# Patient Record
Sex: Female | Born: 1935
Health system: Southern US, Community
[De-identification: ages and names within clinical notes are randomized; demographics above are authoritative.]

## PROBLEM LIST (undated history)

## (undated) DIAGNOSIS — M359 Systemic involvement of connective tissue, unspecified: Secondary | ICD-10-CM

## (undated) DIAGNOSIS — Z8619 Personal history of other infectious and parasitic diseases: Secondary | ICD-10-CM

## (undated) DIAGNOSIS — M19012 Primary osteoarthritis, left shoulder: Secondary | ICD-10-CM

## (undated) DIAGNOSIS — E039 Hypothyroidism, unspecified: Secondary | ICD-10-CM

## (undated) DIAGNOSIS — R011 Cardiac murmur, unspecified: Secondary | ICD-10-CM

## (undated) DIAGNOSIS — I10 Essential (primary) hypertension: Secondary | ICD-10-CM

## (undated) DIAGNOSIS — K219 Gastro-esophageal reflux disease without esophagitis: Secondary | ICD-10-CM

## (undated) DIAGNOSIS — F329 Major depressive disorder, single episode, unspecified: Secondary | ICD-10-CM

## (undated) DIAGNOSIS — R1314 Dysphagia, pharyngoesophageal phase: Secondary | ICD-10-CM

## (undated) DIAGNOSIS — M353 Polymyalgia rheumatica: Secondary | ICD-10-CM

## (undated) DIAGNOSIS — M1990 Unspecified osteoarthritis, unspecified site: Secondary | ICD-10-CM

## (undated) HISTORY — PX: ARTHOSCOPIC ROTAOR CUFF REPAIR: SHX5002

## (undated) HISTORY — PX: FACELIFT: SHX1566

## (undated) HISTORY — PX: CATARACT EXTRACTION W/ INTRAOCULAR LENS IMPLANT: SHX1309

## (undated) HISTORY — DX: Major depressive disorder, single episode, unspecified: F32.9

## (undated) HISTORY — PX: SKIN CANCER EXCISION: SHX779

## (undated) HISTORY — PX: FOOT SURGERY: SHX648

## (undated) HISTORY — DX: Dysphagia, pharyngoesophageal phase: R13.14

## (undated) HISTORY — PX: RENAL ARTERY STENT: SHX2321

## (undated) HISTORY — DX: Personal history of other infectious and parasitic diseases: Z86.19

---

## 2007-05-23 ENCOUNTER — Ambulatory Visit: Payer: Self-pay | Admitting: General Surgery

## 2009-02-15 ENCOUNTER — Ambulatory Visit: Payer: Self-pay | Admitting: Internal Medicine

## 2009-05-02 ENCOUNTER — Ambulatory Visit: Payer: Self-pay | Admitting: Internal Medicine

## 2010-04-12 ENCOUNTER — Ambulatory Visit: Payer: Self-pay | Admitting: Internal Medicine

## 2011-09-11 LAB — HM MAMMOGRAPHY: HM MAMMO: NORMAL

## 2011-09-11 LAB — HM DEXA SCAN

## 2011-09-26 DIAGNOSIS — R1011 Right upper quadrant pain: Secondary | ICD-10-CM | POA: Diagnosis not present

## 2011-09-26 DIAGNOSIS — E039 Hypothyroidism, unspecified: Secondary | ICD-10-CM | POA: Diagnosis not present

## 2011-09-26 DIAGNOSIS — I10 Essential (primary) hypertension: Secondary | ICD-10-CM | POA: Diagnosis not present

## 2011-09-26 DIAGNOSIS — R300 Dysuria: Secondary | ICD-10-CM | POA: Diagnosis not present

## 2011-09-26 DIAGNOSIS — E559 Vitamin D deficiency, unspecified: Secondary | ICD-10-CM | POA: Diagnosis not present

## 2011-09-26 DIAGNOSIS — Z0000 Encounter for general adult medical examination without abnormal findings: Secondary | ICD-10-CM | POA: Diagnosis not present

## 2011-09-26 DIAGNOSIS — K219 Gastro-esophageal reflux disease without esophagitis: Secondary | ICD-10-CM | POA: Diagnosis not present

## 2011-09-26 DIAGNOSIS — E890 Postprocedural hypothyroidism: Secondary | ICD-10-CM | POA: Diagnosis not present

## 2011-09-28 ENCOUNTER — Ambulatory Visit: Payer: Self-pay

## 2011-09-28 DIAGNOSIS — R079 Chest pain, unspecified: Secondary | ICD-10-CM | POA: Diagnosis not present

## 2011-09-28 DIAGNOSIS — M75120 Complete rotator cuff tear or rupture of unspecified shoulder, not specified as traumatic: Secondary | ICD-10-CM | POA: Diagnosis not present

## 2011-09-28 DIAGNOSIS — M19019 Primary osteoarthritis, unspecified shoulder: Secondary | ICD-10-CM | POA: Diagnosis not present

## 2011-09-28 DIAGNOSIS — S2239XA Fracture of one rib, unspecified side, initial encounter for closed fracture: Secondary | ICD-10-CM | POA: Diagnosis not present

## 2011-10-09 DIAGNOSIS — R1011 Right upper quadrant pain: Secondary | ICD-10-CM | POA: Diagnosis not present

## 2011-10-17 DIAGNOSIS — M159 Polyosteoarthritis, unspecified: Secondary | ICD-10-CM | POA: Diagnosis not present

## 2011-10-17 DIAGNOSIS — E2839 Other primary ovarian failure: Secondary | ICD-10-CM | POA: Diagnosis not present

## 2011-10-23 DIAGNOSIS — J019 Acute sinusitis, unspecified: Secondary | ICD-10-CM | POA: Diagnosis not present

## 2011-10-23 DIAGNOSIS — M810 Age-related osteoporosis without current pathological fracture: Secondary | ICD-10-CM | POA: Diagnosis not present

## 2011-10-23 DIAGNOSIS — I10 Essential (primary) hypertension: Secondary | ICD-10-CM | POA: Diagnosis not present

## 2011-10-23 DIAGNOSIS — E039 Hypothyroidism, unspecified: Secondary | ICD-10-CM | POA: Diagnosis not present

## 2011-10-23 DIAGNOSIS — K219 Gastro-esophageal reflux disease without esophagitis: Secondary | ICD-10-CM | POA: Diagnosis not present

## 2011-11-08 ENCOUNTER — Ambulatory Visit: Payer: Self-pay

## 2011-11-08 DIAGNOSIS — Z1231 Encounter for screening mammogram for malignant neoplasm of breast: Secondary | ICD-10-CM | POA: Diagnosis not present

## 2012-02-26 DIAGNOSIS — I10 Essential (primary) hypertension: Secondary | ICD-10-CM | POA: Diagnosis not present

## 2012-02-26 DIAGNOSIS — Z124 Encounter for screening for malignant neoplasm of cervix: Secondary | ICD-10-CM | POA: Diagnosis not present

## 2012-02-26 DIAGNOSIS — E785 Hyperlipidemia, unspecified: Secondary | ICD-10-CM | POA: Diagnosis not present

## 2012-02-26 DIAGNOSIS — N760 Acute vaginitis: Secondary | ICD-10-CM | POA: Diagnosis not present

## 2012-02-26 DIAGNOSIS — D485 Neoplasm of uncertain behavior of skin: Secondary | ICD-10-CM | POA: Diagnosis not present

## 2012-02-26 DIAGNOSIS — R109 Unspecified abdominal pain: Secondary | ICD-10-CM | POA: Diagnosis not present

## 2012-03-03 ENCOUNTER — Ambulatory Visit: Payer: Self-pay | Admitting: Internal Medicine

## 2012-03-03 DIAGNOSIS — R1032 Left lower quadrant pain: Secondary | ICD-10-CM | POA: Diagnosis not present

## 2012-03-03 DIAGNOSIS — D259 Leiomyoma of uterus, unspecified: Secondary | ICD-10-CM | POA: Diagnosis not present

## 2012-03-11 DIAGNOSIS — D269 Other benign neoplasm of uterus, unspecified: Secondary | ICD-10-CM | POA: Diagnosis not present

## 2012-03-11 DIAGNOSIS — R109 Unspecified abdominal pain: Secondary | ICD-10-CM | POA: Diagnosis not present

## 2012-03-11 DIAGNOSIS — I10 Essential (primary) hypertension: Secondary | ICD-10-CM | POA: Diagnosis not present

## 2012-03-24 DIAGNOSIS — D259 Leiomyoma of uterus, unspecified: Secondary | ICD-10-CM | POA: Diagnosis not present

## 2012-04-08 DIAGNOSIS — S90569A Insect bite (nonvenomous), unspecified ankle, initial encounter: Secondary | ICD-10-CM | POA: Diagnosis not present

## 2012-04-08 DIAGNOSIS — L03119 Cellulitis of unspecified part of limb: Secondary | ICD-10-CM | POA: Diagnosis not present

## 2012-04-08 DIAGNOSIS — M79609 Pain in unspecified limb: Secondary | ICD-10-CM | POA: Diagnosis not present

## 2012-04-08 DIAGNOSIS — W57XXXA Bitten or stung by nonvenomous insect and other nonvenomous arthropods, initial encounter: Secondary | ICD-10-CM | POA: Diagnosis not present

## 2012-04-08 DIAGNOSIS — L02419 Cutaneous abscess of limb, unspecified: Secondary | ICD-10-CM | POA: Diagnosis not present

## 2012-04-28 DIAGNOSIS — D239 Other benign neoplasm of skin, unspecified: Secondary | ICD-10-CM | POA: Diagnosis not present

## 2012-04-28 DIAGNOSIS — L821 Other seborrheic keratosis: Secondary | ICD-10-CM | POA: Diagnosis not present

## 2012-04-28 DIAGNOSIS — L820 Inflamed seborrheic keratosis: Secondary | ICD-10-CM | POA: Diagnosis not present

## 2012-04-28 DIAGNOSIS — L819 Disorder of pigmentation, unspecified: Secondary | ICD-10-CM | POA: Diagnosis not present

## 2012-04-28 DIAGNOSIS — Z85828 Personal history of other malignant neoplasm of skin: Secondary | ICD-10-CM | POA: Diagnosis not present

## 2012-06-25 DIAGNOSIS — M19019 Primary osteoarthritis, unspecified shoulder: Secondary | ICD-10-CM | POA: Diagnosis not present

## 2012-06-29 DIAGNOSIS — Z8619 Personal history of other infectious and parasitic diseases: Secondary | ICD-10-CM

## 2012-06-29 HISTORY — PX: JOINT REPLACEMENT: SHX530

## 2012-06-29 HISTORY — DX: Personal history of other infectious and parasitic diseases: Z86.19

## 2012-07-01 DIAGNOSIS — M25519 Pain in unspecified shoulder: Secondary | ICD-10-CM | POA: Diagnosis not present

## 2012-07-01 DIAGNOSIS — E039 Hypothyroidism, unspecified: Secondary | ICD-10-CM | POA: Diagnosis not present

## 2012-07-01 DIAGNOSIS — Z01818 Encounter for other preprocedural examination: Secondary | ICD-10-CM | POA: Diagnosis not present

## 2012-07-01 DIAGNOSIS — R011 Cardiac murmur, unspecified: Secondary | ICD-10-CM | POA: Diagnosis not present

## 2012-07-01 DIAGNOSIS — I10 Essential (primary) hypertension: Secondary | ICD-10-CM | POA: Diagnosis not present

## 2012-07-07 ENCOUNTER — Encounter (HOSPITAL_COMMUNITY): Payer: Self-pay | Admitting: Pharmacy Technician

## 2012-07-09 ENCOUNTER — Encounter (HOSPITAL_COMMUNITY)
Admission: RE | Admit: 2012-07-09 | Discharge: 2012-07-09 | Disposition: A | Discharge status: Home / Self Care | Payer: Medicare Other | Source: Ambulatory Visit | Attending: Orthopedic Surgery | Admitting: Orthopedic Surgery

## 2012-07-09 ENCOUNTER — Other Ambulatory Visit: Payer: Self-pay | Admitting: Orthopedic Surgery

## 2012-07-09 ENCOUNTER — Encounter (HOSPITAL_COMMUNITY): Payer: Self-pay

## 2012-07-09 DIAGNOSIS — Z01818 Encounter for other preprocedural examination: Secondary | ICD-10-CM | POA: Diagnosis not present

## 2012-07-09 HISTORY — DX: Unspecified osteoarthritis, unspecified site: M19.90

## 2012-07-09 HISTORY — DX: Cardiac murmur, unspecified: R01.1

## 2012-07-09 HISTORY — DX: Hypothyroidism, unspecified: E03.9

## 2012-07-09 LAB — URINALYSIS, ROUTINE W REFLEX MICROSCOPIC
Glucose, UA: NEGATIVE mg/dL
Ketones, ur: NEGATIVE mg/dL
Nitrite: NEGATIVE
Specific Gravity, Urine: 1.015 (ref 1.005–1.030)
pH: 5 (ref 5.0–8.0)

## 2012-07-09 LAB — BASIC METABOLIC PANEL
BUN: 15 mg/dL (ref 6–23)
Chloride: 103 mEq/L (ref 96–112)
Creatinine, Ser: 0.89 mg/dL (ref 0.50–1.10)
Glucose, Bld: 99 mg/dL (ref 70–99)
Potassium: 3.8 mEq/L (ref 3.5–5.1)

## 2012-07-09 LAB — CBC
HCT: 43.2 % (ref 36.0–46.0)
Hemoglobin: 14.2 g/dL (ref 12.0–15.0)
MCH: 29.6 pg (ref 26.0–34.0)
MCHC: 32.9 g/dL (ref 30.0–36.0)
MCV: 90.2 fL (ref 78.0–100.0)
RDW: 13.4 % (ref 11.5–15.5)

## 2012-07-09 LAB — SURGICAL PCR SCREEN: MRSA, PCR: NEGATIVE

## 2012-07-09 LAB — TYPE AND SCREEN

## 2012-07-09 NOTE — Progress Notes (Signed)
REQUESTING STRESS TEST, LAST EKG FROM DR. HEATHER HILES OFFICE.

## 2012-07-09 NOTE — Pre-Procedure Instructions (Signed)
20 BLOSSOM CRUME  07/09/2012   Your procedure is scheduled on:  Monday, December 16th.  Report to [facility] at 12:30 PM.  Call this number if you have problems the morning of surgery: [phone]   Remember: Nothing to eat or drink after Midnight.      Take these medicines the morning of surgery with A SIP OF WATER:  Levothyroxine (Synthyroid).    Do not wear jewelry, make-up or nail polish.  Do not wear lotions, powders, or perfumes. You may wear deodorant.  Do not shave 48 hours prior to surgery. Men may shave face and neck.  Do not bring valuables to the hospital.  Contacts, dentures or bridgework may not be worn into surgery.  Leave suitcase in the car. After surgery it may be brought to your room.  For patients admitted to the hospital, checkout time is 11:00 AM the day of discharge.   Patients discharged the day of surgery will not be allowed to drive home.  Name and phone number of your driver: NA  Special Instructions: Shower using CHG 2 nights before surgery and the night before surgery.  If you shower the day of surgery use CHG.  Use special wash - you have one bottle of CHG for all showers.  You should use approximately 1/3 of the bottle for each shower.   Please read over the following fact sheets that you were given: Pain Booklet, Coughing and Deep Breathing, Blood Transfusion Information and Surgical Site Infection Prevention

## 2012-07-09 NOTE — Progress Notes (Signed)
I called Dr Shelba Flake office and spoke with Cordelia Pen about patient's question- patient and daughter thought patient was for a total shoulder not a reverse as stated on the consent order.  Cordelia Pen will speak with MD and have it changed if need be.   ( fFom the notes Cordelia Pen read it seems that it will be one or the other, depending of what MD sees at the time of surgery).

## 2012-07-10 LAB — ABO/RH: ABO/RH(D): O NEG

## 2012-07-11 NOTE — Progress Notes (Addendum)
13:15  LABS AND OFFICE NOTE (FROM [facility]--[phone]) RECEIVED, DATED DEC. 3, 2013, PAGE 4 INDICATES PT IS 'CLEAR FOR SURGERY'..  nO STRESS TEST SEEN (WAS DONE OVER 5 YRS AGO)....DA.... NO EKG AVAILABLE.........WILL DO ONE DOS.Marland KitchenDA  ASLO SPOKE WITH SHERRY AT THE OFFICE...SHE STATED THAT THE WORDING ON THE PERMIT WILL BE CHANGED BY Janace Litten, PA...Marland KitchenPERMIT TO BE SIGNED DOS...Marland KitchenMarland KitchenDA

## 2012-07-13 MED ORDER — CEFAZOLIN SODIUM-DEXTROSE 2-3 GM-% IV SOLR
2.0000 g | INTRAVENOUS | Status: AC
  Administered 2012-07-14: 2 g via INTRAVENOUS
  Filled 2012-07-13: qty 50

## 2012-07-14 ENCOUNTER — Inpatient Hospital Stay (HOSPITAL_COMMUNITY): Payer: Medicare Other | Admitting: Anesthesiology

## 2012-07-14 ENCOUNTER — Inpatient Hospital Stay (HOSPITAL_COMMUNITY): Payer: Medicare Other

## 2012-07-14 ENCOUNTER — Encounter (HOSPITAL_COMMUNITY): Payer: Self-pay | Admitting: Anesthesiology

## 2012-07-14 ENCOUNTER — Encounter (HOSPITAL_COMMUNITY): Payer: Self-pay | Admitting: Orthopedic Surgery

## 2012-07-14 ENCOUNTER — Encounter (HOSPITAL_COMMUNITY)
Admission: RE | Disposition: A | Discharge status: Home / Self Care | Payer: Self-pay | Source: Ambulatory Visit | Attending: Orthopedic Surgery

## 2012-07-14 ENCOUNTER — Inpatient Hospital Stay (HOSPITAL_COMMUNITY)
Admission: RE | Admit: 2012-07-14 | Discharge: 2012-07-17 | DRG: 484 | Disposition: A | Discharge status: Home / Self Care | Payer: Medicare Other | Source: Ambulatory Visit | Attending: Orthopedic Surgery | Admitting: Orthopedic Surgery

## 2012-07-14 DIAGNOSIS — R110 Nausea: Secondary | ICD-10-CM | POA: Diagnosis present

## 2012-07-14 DIAGNOSIS — Z79899 Other long term (current) drug therapy: Secondary | ICD-10-CM

## 2012-07-14 DIAGNOSIS — Z87891 Personal history of nicotine dependence: Secondary | ICD-10-CM

## 2012-07-14 DIAGNOSIS — M19019 Primary osteoarthritis, unspecified shoulder: Principal | ICD-10-CM | POA: Diagnosis present

## 2012-07-14 DIAGNOSIS — Z96619 Presence of unspecified artificial shoulder joint: Secondary | ICD-10-CM | POA: Diagnosis not present

## 2012-07-14 DIAGNOSIS — M19012 Primary osteoarthritis, left shoulder: Secondary | ICD-10-CM | POA: Diagnosis present

## 2012-07-14 DIAGNOSIS — E039 Hypothyroidism, unspecified: Secondary | ICD-10-CM | POA: Diagnosis not present

## 2012-07-14 HISTORY — DX: Primary osteoarthritis, left shoulder: M19.012

## 2012-07-14 HISTORY — PX: TOTAL SHOULDER ARTHROPLASTY: SHX126

## 2012-07-14 SURGERY — ARTHROPLASTY, SHOULDER, TOTAL
Anesthesia: General | Site: Shoulder | Laterality: Left | Wound class: Clean

## 2012-07-14 MED ORDER — METOCLOPRAMIDE HCL 10 MG PO TABS: 5.0000 mg | ORAL_TABLET | Freq: Three times a day (TID) | ORAL | Status: DC | PRN

## 2012-07-14 MED ORDER — ACETAMINOPHEN 650 MG RE SUPP: 650.0000 mg | Freq: Four times a day (QID) | RECTAL | Status: DC | PRN

## 2012-07-14 MED ORDER — MIDAZOLAM HCL 5 MG/5ML IJ SOLN
INTRAMUSCULAR | Status: DC | PRN
  Administered 2012-07-14 (×2): 0.5 mg via INTRAVENOUS

## 2012-07-14 MED ORDER — EPHEDRINE SULFATE 50 MG/ML IJ SOLN: INTRAMUSCULAR | Status: DC | PRN

## 2012-07-14 MED ORDER — GLYCOPYRROLATE 0.2 MG/ML IJ SOLN
INTRAMUSCULAR | Status: DC | PRN
  Administered 2012-07-14: 0.6 mg via INTRAVENOUS

## 2012-07-14 MED ORDER — HYDROCODONE-ACETAMINOPHEN 10-325 MG PO TABS: 1.0000 | ORAL_TABLET | Freq: Four times a day (QID) | ORAL | Status: DC | PRN

## 2012-07-14 MED ORDER — ARTIFICIAL TEARS OP OINT
TOPICAL_OINTMENT | OPHTHALMIC | Status: DC | PRN
  Administered 2012-07-14: 1 via OPHTHALMIC

## 2012-07-14 MED ORDER — ALUM & MAG HYDROXIDE-SIMETH 200-200-20 MG/5ML PO SUSP: 30.0000 mL | ORAL | Status: DC | PRN

## 2012-07-14 MED ORDER — CEFAZOLIN SODIUM-DEXTROSE 2-3 GM-% IV SOLR
2.0000 g | Freq: Four times a day (QID) | INTRAVENOUS | Status: AC
  Administered 2012-07-14 – 2012-07-15 (×3): 2 g via INTRAVENOUS
  Filled 2012-07-14 (×3): qty 50

## 2012-07-14 MED ORDER — ONDANSETRON HCL 4 MG/2ML IJ SOLN
INTRAMUSCULAR | Status: DC | PRN
  Administered 2012-07-14: 4 mg via INTRAVENOUS

## 2012-07-14 MED ORDER — ONDANSETRON HCL 4 MG PO TABS
4.0000 mg | ORAL_TABLET | Freq: Four times a day (QID) | ORAL | Status: DC | PRN
  Administered 2012-07-16: 4 mg via ORAL
  Filled 2012-07-14: qty 1

## 2012-07-14 MED ORDER — BUPIVACAINE LIPOSOME 1.3 % IJ SUSP
20.0000 mL | Freq: Once | INTRAMUSCULAR | Status: DC
  Filled 2012-07-14: qty 20

## 2012-07-14 MED ORDER — POLYETHYLENE GLYCOL 3350 17 G PO PACK: 17.0000 g | PACK | Freq: Every day | ORAL | Status: DC | PRN

## 2012-07-14 MED ORDER — ADULT MULTIVITAMIN W/MINERALS CH
1.0000 | ORAL_TABLET | Freq: Every day | ORAL | Status: DC
  Administered 2012-07-14 – 2012-07-17 (×3): 1 via ORAL
  Filled 2012-07-14 (×4): qty 1

## 2012-07-14 MED ORDER — NEOSTIGMINE METHYLSULFATE 1 MG/ML IJ SOLN
INTRAMUSCULAR | Status: DC | PRN
  Administered 2012-07-14: 3 mg via INTRAVENOUS

## 2012-07-14 MED ORDER — SENNA 8.6 MG PO TABS
1.0000 | ORAL_TABLET | Freq: Two times a day (BID) | ORAL | Status: DC
  Administered 2012-07-14 – 2012-07-17 (×6): 8.6 mg via ORAL
  Filled 2012-07-14 (×7): qty 1

## 2012-07-14 MED ORDER — BUPIVACAINE LIPOSOME 1.3 % IJ SUSP
INTRAMUSCULAR | Status: DC | PRN
  Administered 2012-07-14: 20 mL

## 2012-07-14 MED ORDER — DEXAMETHASONE SODIUM PHOSPHATE 10 MG/ML IJ SOLN
INTRAMUSCULAR | Status: DC | PRN
  Administered 2012-07-14: 10 mg via INTRAVENOUS

## 2012-07-14 MED ORDER — LISINOPRIL 5 MG PO TABS
5.0000 mg | ORAL_TABLET | Freq: Every day | ORAL | Status: DC
  Administered 2012-07-14 – 2012-07-16 (×3): 5 mg via ORAL
  Filled 2012-07-14 (×5): qty 1

## 2012-07-14 MED ORDER — EPHEDRINE SULFATE 50 MG/ML IJ SOLN
INTRAMUSCULAR | Status: DC | PRN
  Administered 2012-07-14: 10 mg via INTRAVENOUS

## 2012-07-14 MED ORDER — PHENOL 1.4 % MT LIQD: 1.0000 | OROMUCOSAL | Status: DC | PRN

## 2012-07-14 MED ORDER — ONDANSETRON HCL 4 MG/2ML IJ SOLN: 4.0000 mg | Freq: Four times a day (QID) | INTRAMUSCULAR | Status: DC | PRN

## 2012-07-14 MED ORDER — SORBITOL 70 % SOLN: 30.0000 mL | Freq: Every day | Status: DC | PRN

## 2012-07-14 MED ORDER — ACETAMINOPHEN 10 MG/ML IV SOLN
1000.0000 mg | Freq: Once | INTRAVENOUS | Status: AC
  Administered 2012-07-14: 1000 mg via INTRAVENOUS
  Filled 2012-07-14: qty 100

## 2012-07-14 MED ORDER — LIDOCAINE HCL (CARDIAC) 20 MG/ML IV SOLN
INTRAVENOUS | Status: DC | PRN
  Administered 2012-07-14: 40 mg via INTRAVENOUS

## 2012-07-14 MED ORDER — HYDROMORPHONE HCL PF 1 MG/ML IJ SOLN
0.2500 mg | INTRAMUSCULAR | Status: DC | PRN
  Administered 2012-07-14 (×2): 0.5 mg via INTRAVENOUS

## 2012-07-14 MED ORDER — TRAMADOL HCL 50 MG PO TABS
50.0000 mg | ORAL_TABLET | Freq: Four times a day (QID) | ORAL | Status: DC | PRN
  Administered 2012-07-15 – 2012-07-17 (×4): 50 mg via ORAL
  Filled 2012-07-14 (×4): qty 1

## 2012-07-14 MED ORDER — LACTATED RINGERS IV SOLN
INTRAVENOUS | Status: DC | PRN
  Administered 2012-07-14 (×2): via INTRAVENOUS

## 2012-07-14 MED ORDER — HYDROCODONE-ACETAMINOPHEN 10-325 MG PO TABS
1.0000 | ORAL_TABLET | ORAL | Status: DC | PRN
  Administered 2012-07-15 (×3): 1 via ORAL
  Administered 2012-07-16: 2 via ORAL
  Administered 2012-07-16: 1 via ORAL
  Filled 2012-07-14 (×3): qty 1
  Filled 2012-07-14: qty 2
  Filled 2012-07-14: qty 1

## 2012-07-14 MED ORDER — HYDROCODONE-ACETAMINOPHEN 5-325 MG PO TABS
ORAL_TABLET | ORAL | Status: AC
  Administered 2012-07-14: 2
  Filled 2012-07-14: qty 2

## 2012-07-14 MED ORDER — METHOCARBAMOL 500 MG PO TABS
500.0000 mg | ORAL_TABLET | Freq: Four times a day (QID) | ORAL | Status: DC | PRN
  Administered 2012-07-15 – 2012-07-16 (×3): 500 mg via ORAL
  Filled 2012-07-14 (×4): qty 1

## 2012-07-14 MED ORDER — ROCURONIUM BROMIDE 100 MG/10ML IV SOLN
INTRAVENOUS | Status: DC | PRN
  Administered 2012-07-14: 50 mg via INTRAVENOUS

## 2012-07-14 MED ORDER — DOCUSATE SODIUM 100 MG PO CAPS
100.0000 mg | ORAL_CAPSULE | Freq: Two times a day (BID) | ORAL | Status: DC
  Administered 2012-07-14 – 2012-07-17 (×6): 100 mg via ORAL
  Filled 2012-07-14 (×7): qty 1

## 2012-07-14 MED ORDER — ACETAMINOPHEN 10 MG/ML IV SOLN
INTRAVENOUS | Status: AC
  Filled 2012-07-14: qty 100

## 2012-07-14 MED ORDER — METHOCARBAMOL 100 MG/ML IJ SOLN
500.0000 mg | Freq: Four times a day (QID) | INTRAVENOUS | Status: DC | PRN
  Administered 2012-07-14: 500 mg via INTRAVENOUS
  Filled 2012-07-14 (×2): qty 5

## 2012-07-14 MED ORDER — SENNA-DOCUSATE SODIUM 8.6-50 MG PO TABS: 1.0000 | ORAL_TABLET | Freq: Every day | ORAL | Status: DC

## 2012-07-14 MED ORDER — MORPHINE SULFATE 2 MG/ML IJ SOLN: 1.0000 mg | INTRAMUSCULAR | Status: DC | PRN

## 2012-07-14 MED ORDER — POTASSIUM CHLORIDE IN NACL 20-0.45 MEQ/L-% IV SOLN
INTRAVENOUS | Status: DC
  Administered 2012-07-15 (×2): via INTRAVENOUS
  Filled 2012-07-14 (×6): qty 1000

## 2012-07-14 MED ORDER — SODIUM CHLORIDE 0.9 % IR SOLN
Status: DC | PRN
  Administered 2012-07-14: 1000 mL

## 2012-07-14 MED ORDER — LACTATED RINGERS IV SOLN
INTRAVENOUS | Status: DC
  Administered 2012-07-14: 13:00:00 via INTRAVENOUS

## 2012-07-14 MED ORDER — METOCLOPRAMIDE HCL 5 MG/ML IJ SOLN: 5.0000 mg | Freq: Three times a day (TID) | INTRAMUSCULAR | Status: DC | PRN

## 2012-07-14 MED ORDER — THROMBIN 20000 UNITS EX SOLR
CUTANEOUS | Status: AC
  Filled 2012-07-14: qty 20000

## 2012-07-14 MED ORDER — FENTANYL CITRATE 0.05 MG/ML IJ SOLN
INTRAMUSCULAR | Status: DC | PRN
  Administered 2012-07-14: 100 ug via INTRAVENOUS
  Administered 2012-07-14: 50 ug via INTRAVENOUS
  Administered 2012-07-14: 100 ug via INTRAVENOUS
  Administered 2012-07-14 (×2): 50 ug via INTRAVENOUS

## 2012-07-14 MED ORDER — DIPHENHYDRAMINE HCL 12.5 MG/5ML PO ELIX: 12.5000 mg | ORAL_SOLUTION | ORAL | Status: DC | PRN

## 2012-07-14 MED ORDER — HYDROMORPHONE HCL PF 1 MG/ML IJ SOLN
INTRAMUSCULAR | Status: AC
  Filled 2012-07-14: qty 1

## 2012-07-14 MED ORDER — ACETAMINOPHEN 325 MG PO TABS: 650.0000 mg | ORAL_TABLET | Freq: Four times a day (QID) | ORAL | Status: DC | PRN

## 2012-07-14 MED ORDER — LEVOTHYROXINE SODIUM 88 MCG PO TABS
88.0000 ug | ORAL_TABLET | Freq: Every day | ORAL | Status: DC
  Administered 2012-07-15 – 2012-07-16 (×3): 88 ug via ORAL
  Filled 2012-07-14 (×5): qty 1

## 2012-07-14 MED ORDER — ZOLPIDEM TARTRATE 5 MG PO TABS: 5.0000 mg | ORAL_TABLET | Freq: Every evening | ORAL | Status: DC | PRN

## 2012-07-14 MED ORDER — PROPOFOL 10 MG/ML IV BOLUS
INTRAVENOUS | Status: DC | PRN
  Administered 2012-07-14: 120 mg via INTRAVENOUS

## 2012-07-14 MED ORDER — BUPIVACAINE HCL (PF) 0.25 % IJ SOLN
INTRAMUSCULAR | Status: AC
  Filled 2012-07-14: qty 30

## 2012-07-14 MED ORDER — MENTHOL 3 MG MT LOZG: 1.0000 | LOZENGE | OROMUCOSAL | Status: DC | PRN

## 2012-07-14 SURGICAL SUPPLY — 68 items
BENZOIN TINCTURE PRP APPL 2/3 (GAUZE/BANDAGES/DRESSINGS) ×2 IMPLANT
BIT DRILL QUICK RELEASE PRPHRL (DRILL) ×3 IMPLANT
BLADE SAW SAG 29X58X.64 (BLADE) ×2 IMPLANT
BOOTCOVER CLEANROOM LRG (PROTECTIVE WEAR) ×4 IMPLANT
BOWL SMART MIX CTS (DISPOSABLE) IMPLANT
BRUSH FEMORAL CANAL (MISCELLANEOUS) IMPLANT
CEMENT BONE DEPUY (Cement) ×2 IMPLANT
CLOTH BEACON ORANGE TIMEOUT ST (SAFETY) ×2 IMPLANT
CLSR STERI-STRIP ANTIMIC 1/2X4 (GAUZE/BANDAGES/DRESSINGS) ×2 IMPLANT
COVER SURGICAL LIGHT HANDLE (MISCELLANEOUS) ×2 IMPLANT
COVER TABLE BACK 60X90 (DRAPES) IMPLANT
DRAPE C-ARM 42X72 X-RAY (DRAPES) IMPLANT
DRAPE INCISE IOBAN 66X45 STRL (DRAPES) IMPLANT
DRAPE U-SHAPE 47X51 STRL (DRAPES) ×2 IMPLANT
DRILL QUICK RELEASE PERIPHERAL (DRILL) ×6
DRSG MEPILEX BORDER 4X8 (GAUZE/BANDAGES/DRESSINGS) ×2 IMPLANT
DRSG PAD ABDOMINAL 8X10 ST (GAUZE/BANDAGES/DRESSINGS) ×2 IMPLANT
DURAPREP 26ML APPLICATOR (WOUND CARE) ×2 IMPLANT
ELECT BLADE 6.5 EXT (BLADE) IMPLANT
ELECT NEEDLE TIP 2.8 STRL (NEEDLE) ×2 IMPLANT
ELECT REM PT RETURN 9FT ADLT (ELECTROSURGICAL) ×2
ELECTRODE REM PT RTRN 9FT ADLT (ELECTROSURGICAL) ×1 IMPLANT
EVACUATOR 1/8 PVC DRAIN (DRAIN) IMPLANT
FACESHIELD LNG OPTICON STERILE (SAFETY) IMPLANT
GLOVE BIOGEL PI IND STRL 8 (GLOVE) ×1 IMPLANT
GLOVE BIOGEL PI INDICATOR 8 (GLOVE) ×1
GLOVE ORTHO TXT STRL SZ7.5 (GLOVE) ×4 IMPLANT
GLOVE SURG ORTHO 8.0 STRL STRW (GLOVE) ×4 IMPLANT
GOWN STRL NON-REIN LRG LVL3 (GOWN DISPOSABLE) IMPLANT
HANDPIECE INTERPULSE COAX TIP (DISPOSABLE) ×1
HOOD PEEL AWAY FACE SHEILD DIS (HOOD) ×6 IMPLANT
KIT BASIN OR (CUSTOM PROCEDURE TRAY) ×2 IMPLANT
KIT ROOM TURNOVER OR (KITS) ×2 IMPLANT
MANIFOLD NEPTUNE II (INSTRUMENTS) ×2 IMPLANT
NEEDLE 1/2 CIR CATGUT .05X1.09 (NEEDLE) ×2 IMPLANT
NEEDLE HYPO 25GX1X1/2 BEV (NEEDLE) ×2 IMPLANT
NS IRRIG 1000ML POUR BTL (IV SOLUTION) ×2 IMPLANT
PACK SHOULDER (CUSTOM PROCEDURE TRAY) ×2 IMPLANT
PAD ARMBOARD 7.5X6 YLW CONV (MISCELLANEOUS) ×4 IMPLANT
PIN STEINMANN THREADED TIP (PIN) ×2 IMPLANT
PIN THREADED REVERSE (PIN) ×2 IMPLANT
RETRIEVER SUT HEWSON (MISCELLANEOUS) IMPLANT
SET HNDPC FAN SPRY TIP SCT (DISPOSABLE) ×1 IMPLANT
SLING ARM IMMOBILIZER LRG (SOFTGOODS) ×2 IMPLANT
SLING ARM IMMOBILIZER MED (SOFTGOODS) IMPLANT
SMARTMIX MINI TOWER (MISCELLANEOUS) ×2
SPONGE GAUZE 4X4 12PLY (GAUZE/BANDAGES/DRESSINGS) ×2 IMPLANT
SPONGE LAP 18X18 X RAY DECT (DISPOSABLE) ×2 IMPLANT
STRIP CLOSURE SKIN 1/2X4 (GAUZE/BANDAGES/DRESSINGS) ×2 IMPLANT
SUCTION FRAZIER TIP 10 FR DISP (SUCTIONS) ×2 IMPLANT
SUPPORT WRAP ARM LG (MISCELLANEOUS) ×2 IMPLANT
SUT ETHIBOND 2 0 SH (SUTURE)
SUT ETHIBOND 2 0 SH 36X2 (SUTURE) IMPLANT
SUT ETHIBOND 2 OS 4 DA (SUTURE) IMPLANT
SUT ETHIBOND CT1 BRD 2-0 30IN (SUTURE) IMPLANT
SUT FIBERWIRE #2 38 T-5 BLUE (SUTURE) ×8
SUT MNCRL AB 4-0 PS2 18 (SUTURE) ×2 IMPLANT
SUT VIC AB 2-0 CT1 27 (SUTURE) ×1
SUT VIC AB 2-0 CT1 TAPERPNT 27 (SUTURE) ×1 IMPLANT
SUT VIC AB 2-0 SH 18 (SUTURE) ×2 IMPLANT
SUT VIC AB 3-0 SH 18 (SUTURE) ×2 IMPLANT
SUTURE FIBERWR #2 38 T-5 BLUE (SUTURE) ×4 IMPLANT
SYR CONTROL 10ML LL (SYRINGE) ×2 IMPLANT
TOWEL OR 17X24 6PK STRL BLUE (TOWEL DISPOSABLE) ×2 IMPLANT
TOWEL OR 17X26 10 PK STRL BLUE (TOWEL DISPOSABLE) ×2 IMPLANT
TOWER SMARTMIX MINI (MISCELLANEOUS) ×1 IMPLANT
TRAY FOLEY CATH 14FR (SET/KITS/TRAYS/PACK) ×2 IMPLANT
WATER STERILE IRR 1000ML POUR (IV SOLUTION) ×2 IMPLANT

## 2012-07-14 NOTE — H&P (Signed)
PREOPERATIVE H&P  Chief Complaint: LEFT SHOULDER DEGENERATIVE ARTHRITIS PRIMARY DIAGNOSIS  HPI: Kathryn Wise is a 78 y.o. female who presents for preoperative history and physical with a diagnosis of LEFT SHOULDER DEGENERATIVE ARTHRITIS PRIMARY DIAGNOSIS. Symptoms are rated as moderate to severe, and have been worsening.  This is significantly impairing activities of daily living.  She has elected for surgical management. She has failed injections, activity modification, as well as oral pain medications. She requests a shoulder replacement.  Past Medical History  Diagnosis Date  . Hypothyroidism   . Heart murmur     has had years and years  . Arthritis   . No pertinent past medical history     HX BRONCHITIS    Past Surgical History  Procedure Date  . Foot surgery     rt foot   TUMOR REMOVED  . Arthoscopic rotaor cuff repair     LEFT  10+  YEARS    . Cataract extraction w/ intraocular lens implant     RIGHT EYE  . Skin cancer excision   . Facelift    History   Social History  . Marital Status: Widowed    Spouse Name: N/A    Number of Children: N/A  . Years of Education: N/A   Social History Main Topics  . Smoking status: Former Games developer  . Smokeless tobacco: Not on file  . Alcohol Use: Yes     Comment: OCCAS WINE   . Drug Use: No  . Sexually Active:    Other Topics Concern  . Not on file   Social History Narrative  . No narrative on file   No family history on file. Allergies  Allergen Reactions  . Oxycontin (Oxycodone Hcl Er)    Prior to Admission medications   Medication Sig Start Date End Date Taking? Authorizing Provider  Bioflavonoid Products (BIOFLEX PO) Take 1 tablet by mouth daily.   Yes Historical Provider, MD  Calcium-Vitamin D-Vitamin K (VIACTIV PO) Take 1 tablet by mouth daily.   Yes Historical Provider, MD  Cyanocobalamin (VITAMIN B 12 PO) Take 1 tablet by mouth daily.   Yes Historical Provider, MD  Diclofenac-Misoprostol (ARTHROTEC PO) Take 1  tablet by mouth daily.   Yes Historical Provider, MD  levothyroxine (SYNTHROID, LEVOTHROID) 88 MCG tablet Take 88 mcg by mouth every morning.   Yes Historical Provider, MD  lisinopril (PRINIVIL,ZESTRIL) 5 MG tablet Take 5 mg by mouth daily.   Yes Historical Provider, MD  meloxicam (MOBIC) 15 MG tablet Take 15 mg by mouth daily.   Yes Historical Provider, MD  Multiple Vitamin (MULTIVITAMIN WITH MINERALS) TABS Take 1 tablet by mouth daily.   Yes Historical Provider, MD  POTASSIUM PO Take 1 tablet by mouth daily.   Yes Historical Provider, MD  traMADol (ULTRAM) 50 MG tablet Take 50 mg by mouth every 6 (six) hours as needed. For pain   Yes Historical Provider, MD     Positive ROS: All other systems have been reviewed and were otherwise negative with the exception of those mentioned in the HPI and as above.  Physical Exam: General: Alert, no acute distress Cardiovascular: No pedal edema Respiratory: No cyanosis, no use of accessory musculature GI: No organomegaly, abdomen is soft and non-tender Skin: No lesions in the area of chief complaint Neurologic: Sensation intact distally Psychiatric: Patient is competent for consent with normal mood and affect Lymphatic: No axillary or cervical lymphadenopathy  MUSCULOSKELETAL: Left shoulder has active range of motion 0-90. External rotation to  neutral. She has positive pain with crepitance. Limited strength.  Assessment: LEFT SHOULDER DEGENERATIVE ARTHRITIS PRIMARY DIAGNOSIS  Plan: Plan for Procedure(s): Left total shoulder arthroplasty, possible reverse, depending on operative findings. She's had a previous rotator cuff repair, and her preoperative MRI demonstrates intact rotator cuff musculature, so I suspect that she will be appropriate for a total shoulder replacement, but I will be prepared to do a reverse shoulder replacement if needed.  The risks benefits and alternatives were discussed with the patient including but not limited to the  risks of nonoperative treatment, versus surgical intervention including infection, bleeding, nerve injury,  blood clots, cardiopulmonary complications, morbidity, mortality, among others, and they were willing to proceed.   Kaveon Blatz P, MD Cell [phone] Pager [phone]  07/14/2012 3:42 PM

## 2012-07-14 NOTE — Transfer of Care (Signed)
Immediate Anesthesia Transfer of Care Note  Patient: Kathryn Wise  Procedure(s) Performed: Procedure(s) (LRB) with comments: TOTAL SHOULDER ARTHROPLASTY (Left)  Patient Location: PACU  Anesthesia Type:General  Level of Consciousness: awake, alert  and oriented  Airway & Oxygen Therapy: Patient Spontanous Breathing and Patient connected to nasal cannula oxygen  Post-op Assessment: Report given to PACU RN and Post -op Vital signs reviewed and stable  Post vital signs: Reviewed and stable  Complications: No apparent anesthesia complications

## 2012-07-14 NOTE — Anesthesia Preprocedure Evaluation (Signed)
Anesthesia Evaluation  Patient identified by MRN, date of birth, ID band Patient awake    Reviewed: Allergy & Precautions, H&P , NPO status , Patient's Chart, lab work & pertinent test results  Airway Mallampati: II TM Distance: >3 FB Neck ROM: Full    Dental No notable dental hx. (+) Teeth Intact and Dental Advisory Given   Pulmonary neg pulmonary ROS,  breath sounds clear to auscultation  Pulmonary exam normal       Cardiovascular hypertension, On Medications + Valvular Problems/Murmurs Rhythm:Regular Rate:Normal + Systolic murmurs    Neuro/Psych negative neurological ROS  negative psych ROS   GI/Hepatic negative GI ROS, Neg liver ROS,   Endo/Other  Hypothyroidism   Renal/GU negative Renal ROS  negative genitourinary   Musculoskeletal   Abdominal   Peds  Hematology negative hematology ROS (+)   Anesthesia Other Findings   Reproductive/Obstetrics negative OB ROS                           Anesthesia Physical Anesthesia Plan  ASA: II  Anesthesia Plan: General   Post-op Pain Management:    Induction: Intravenous  Airway Management Planned: Oral ETT  Additional Equipment:   Intra-op Plan:   Post-operative Plan: Extubation in OR  Informed Consent: I have reviewed the patients History and Physical, chart, labs and discussed the procedure including the risks, benefits and alternatives for the proposed anesthesia with the patient or authorized representative who has indicated his/her understanding and acceptance.   Dental advisory given  Plan Discussed with: CRNA  Anesthesia Plan Comments: (Pt. and daughter in law Dr. Sandy Salaam have spoken with Dr. Dion Saucier about injecting Exparel into the surgical site. I have spoken with both surgeons and this is how we will proceed. I stated that a peripheral nerve block can not be done in conjunction with Exparel due to the risk of local anesthetic  toxicity. Pt. Understands.)        Anesthesia Quick Evaluation

## 2012-07-14 NOTE — Anesthesia Postprocedure Evaluation (Signed)
Anesthesia Post Note  Patient: Kathryn Wise  Procedure(s) Performed: Procedure(s) (LRB): TOTAL SHOULDER ARTHROPLASTY (Left)  Anesthesia type: general  Patient location: PACU  Post pain: Pain level controlled  Post assessment: Patient's Cardiovascular Status Stable  Last Vitals:  Filed Vitals:   07/14/12 2005  BP: 134/82  Pulse: 69  Temp: 36.5 C  Resp: 18    Post vital signs: Reviewed and stable  Level of consciousness: sedated  Complications: No apparent anesthesia complications

## 2012-07-14 NOTE — Preoperative (Signed)
Beta Blockers   Reason not to administer Beta Blockers:Not Applicable 

## 2012-07-14 NOTE — Op Note (Signed)
07/14/2012  6:31 PM  PATIENT:  Kathryn Wise    PRE-OPERATIVE DIAGNOSIS:  LEFT SHOULDER DEGENERATIVE ARTHRITIS PRIMARY DIAGNOSIS  POST-OPERATIVE DIAGNOSIS:  Same  PROCEDURE:  TOTAL SHOULDER ARTHROPLASTY  SURGEON:  Eulas Post, MD  PHYSICIAN ASSISTANT: Janace Litten, OPA-C, present and scrubbed throughout the case, critical for completion in a timely fashion, and for retraction, instrumentation, and closure.  ANESTHESIA:   General  PREOPERATIVE INDICATIONS:  Kathryn Wise is a  78 y.o. female with a diagnosis of LEFT SHOULDER DEGENERATIVE ARTHRITIS PRIMARY DIAGNOSIS who failed conservative measures and elected for surgical management.    The risks benefits and alternatives were discussed with the patient preoperatively including but not limited to the risks of infection, bleeding, nerve injury, cardiopulmonary complications, the need for revision surgery, dislocation, loosening, incomplete relief of pain, among others, and the patient was willing to proceed.   OPERATIVE IMPLANTS: Biomet size 10 mini press-fit humeral stem, size 46+18 Versa-dial humeral head, set in the D position with increased coverage posteriorly, with a small cemented glenoid polyethylene 3 peg implant with a central regenerex noncemented post.   OPERATIVE FINDINGS: Advanced glenohumeral osteoarthritis involving the glenoid and the humeral head with substantial osteophyte formation inferiorly.The rotator cuff was intact.   OPERATIVE PROCEDURE: The patient was brought to the operating room and placed in the supine position. General anesthesia was administered. IV antibiotics were given.  A foley was placed. The upper extremity was prepped and draped in usual sterile fashion. The patient was in a beachchair position with all bony prominences padded.   Time out was performed and a deltopectoral approach was carried out. The biceps tendon was tenodesed to the pectoralis tendon. The subscapularis was released, tagging  it with a #2 FiberWire, leaving a cuff of tendon for repair.   The inferior osteophyte was removed, and release of the capsule off of the humeral side was completed. The head was dislocated, and I reamed sequentially. I placed the humeral cutting guide at 30 of retroversion, and then pinned this into place, and made my humeral neck cut. This was at the appropriate level.   I then placed deep retractors and exposed the glenoid. I excised the labrum circumferentially, taking care to protect the axillary nerve inferiorly.   I then placed a guidewire into the center position, controlling appropriate version and inclination. I then reamed over the guidewire with the small reamer, and drilled centrally to prepare for the peg, and was satisfied with the preparation. I preserved the subchondral bone in order to maximize the strength and minimize the risk for subsequent subsidence.   I then and then placed the guide, and then drilled the 3 peripheral peg holes. I had excellent bony circumferential contact. All 3 holes had good bony end.  I then cleaned the glenoid, irrigated it copiously, and then dried it and cemented the prosthesis into place. Excellent seating was achieved. I had full exposure. The cement cured, and then I turned my attention to the humeral side.   I sequentially broached, up to the selected size, with the broach set at 30 of retroversion. I then placed the real stem. I trialed with multiple heads, and the above-named component was selected. Increased posterior coverage improved the coverage. The soft tissue tension was appropriate.   I then impacted the real humeral head into place, reduced the head, and irrigated copiously. Excellent stability and range of motion was achieved. I repaired the subscapularis with 4 #2 FiberWire, as well as the rotator interval, and  irrigated copiously once more. The subcutaneous tissue was closed with Vicryl including the deltopectoral fascia.   The skin  was closed with Steri-Strips and sterile gauze was applied. She was also injected with Exparel. She tolerated the procedure well and there were no complications.

## 2012-07-14 NOTE — Anesthesia Procedure Notes (Addendum)
Procedure Name: Intubation Date/Time: 07/14/2012 4:03 PM Performed by: Darcey Nora B Pre-anesthesia Checklist: Patient identified, Emergency Drugs available, Suction available and Patient being monitored Patient Re-evaluated:Patient Re-evaluated prior to inductionOxygen Delivery Method: Circle system utilized Preoxygenation: Pre-oxygenation with 100% oxygen Intubation Type: IV induction Ventilation: Mask ventilation without difficulty Laryngoscope Size: Mac and 3 Grade View: Grade I Tube type: Oral Tube size: 7.5 mm Number of attempts: 1 Airway Equipment and Method: Stylet Placement Confirmation: ETT inserted through vocal cords under direct vision,  breath sounds checked- equal and bilateral and positive ETCO2 Secured at: 21 (cm at teeth) cm Tube secured with: Tape Dental Injury: Teeth and Oropharynx as per pre-operative assessment    Anesthesia Regional Block:   Narrative:

## 2012-07-15 ENCOUNTER — Encounter (HOSPITAL_COMMUNITY): Payer: Self-pay | Admitting: Orthopedic Surgery

## 2012-07-15 LAB — BASIC METABOLIC PANEL
BUN: 15 mg/dL (ref 6–23)
CO2: 25 mEq/L (ref 19–32)
Calcium: 9 mg/dL (ref 8.4–10.5)
GFR calc non Af Amer: 64 mL/min — ABNORMAL LOW (ref >90)
Glucose, Bld: 137 mg/dL — ABNORMAL HIGH (ref 70–99)
Sodium: 138 mEq/L (ref 135–145)

## 2012-07-15 LAB — CBC
HCT: 33.1 % — ABNORMAL LOW (ref 36.0–46.0)
Hemoglobin: 10.8 g/dL — ABNORMAL LOW (ref 12.0–15.0)
MCH: 29.5 pg (ref 26.0–34.0)
MCHC: 32.6 g/dL (ref 30.0–36.0)
RBC: 3.66 MIL/uL — ABNORMAL LOW (ref 3.87–5.11)

## 2012-07-15 NOTE — Progress Notes (Signed)
UR COMPLETED  

## 2012-07-15 NOTE — Addendum Note (Signed)
Addendum  created 07/15/12 1319 by Adair Laundry, CRNA   Modules edited:Anesthesia Blocks and Procedures, Charges VN, Inpatient Notes

## 2012-07-15 NOTE — Evaluation (Signed)
Occupational Therapy Evaluation Patient Details Name: Kathryn Wise MRN: [id number] DOB: 1930/02/01 Today's Date: 07/15/2012 Time: 4098-1191 OT Time Calculation (min): 40 min  OT Assessment / Plan / Recommendation Clinical Impression  78 yo female s/p LT UE TSA that does not require acute OT and at adequate level for d/c home. Ot to sign off    OT Assessment  Patient does not need any further OT services    Follow Up Recommendations  No OT follow up    Barriers to Discharge      Equipment Recommendations  None recommended by OT    Recommendations for Other Services    Frequency       Precautions / Restrictions Precautions Precautions: Shoulder Type of Shoulder Precautions: TSA - no active shoulder ROM Restrictions Weight Bearing Restrictions: Yes LUE Weight Bearing: Non weight bearing   Pertinent Vitals/Pain No pain reported during session Pt just wants to know when she will be able to swim again    ADL  Eating/Feeding: Set up Where Assessed - Eating/Feeding: Chair Grooming: Wash/dry hands;Wash/dry face;Teeth care;Set up Where Assessed - Grooming: Unsupported standing Upper Body Bathing: Chest;Right arm;Left arm;Abdomen;Moderate assistance Where Assessed - Upper Body Bathing: Unsupported sitting Lower Body Bathing: Minimal assistance Where Assessed - Lower Body Bathing: Supported sit to stand Upper Body Dressing: Moderate assistance Where Assessed - Upper Body Dressing: Unsupported sitting Lower Body Dressing: Minimal assistance Where Assessed - Lower Body Dressing: Supported sit to stand Toilet Transfer: Supervision/safety Statistician Method: Sit to Barista: Regular height toilet Toileting - Clothing Manipulation and Hygiene: Minimal assistance Where Assessed - Engineer, mining and Hygiene: Sit to stand from 3-in-1 or toilet Equipment Used: Gait belt Transfers/Ambulation Related to ADLs: pt ambulating Supervision  level. Pt has a very quick gait and very eager to be independent ADL Comments: pt educated on LT ue positioning for bathing, dressing, and comfort. Pt completed full ADL at EOB with daughter present. pt attempting to perform AROM shoulder LT UE with mod v/c to relax and avoid AROM. ALl education complete for LT UE TSA  and pt is at adequate level for d/c home. RN Nettie Elm called and informed. Ot to sign off     OT Diagnosis:    OT Problem List:   OT Treatment Interventions:     OT Goals    Visit Information  Last OT Received On: 07/15/12 Assistance Needed: +1    Subjective Data  Subjective: "I will go home today if they can get me out of here before 4 today. If I am going to leave that late I will just stay another night because it will be easier" Patient Stated Goal: to get back to swimming   Prior Functioning     Home Living Lives With:  (going to stay daughter) Available Help at Discharge: Available 24 hours/day;Family Type of Home: House Prior Function Level of Independence: Independent Able to Take Stairs?: Yes Driving: Yes Vocation: Retired Comments: Pt works out 4 times a week and plays golf twice a week. Very active for her age Communication Communication: No difficulties Dominant Hand: Right         Vision/Perception     Cognition  Overall Cognitive Status: Appears within functional limits for tasks assessed/performed Arousal/Alertness: Awake/alert Orientation Level: Appears intact for tasks assessed Behavior During Session: [facility] for tasks performed    Extremity/Trunk Assessment Right Upper Extremity Assessment RUE ROM/Strength/Tone: Within functional levels Left Upper Extremity Assessment LUE ROM/Strength/Tone: Deficits Trunk Assessment Trunk Assessment: Normal  Mobility Bed Mobility Bed Mobility: Supine to Sit;Sitting - Scoot to Edge of Bed Supine to Sit: 5: Supervision;HOB flat Sitting - Scoot to Edge of Bed: 5: Supervision Details for Bed  Mobility Assistance: [facility] for d/c Transfers Transfers: Sit to Stand;Stand to Sit Sit to Stand: 5: Supervision;With upper extremity assist;From bed Stand to Sit: 5: Supervision;With upper extremity assist;To chair/3-in-1 Details for Transfer Assistance: [facility] for d/c home     Shoulder Instructions     Exercise     Balance     End of Session OT - End of Session Activity Tolerance: Patient tolerated treatment well Patient left: in chair;with call bell/phone within reach;with family/visitor present Nurse Communication: Precautions;Mobility status  GO     Harrel Carina [facility] 07/15/2012, 2:09 PM Pager: [phone]

## 2012-07-15 NOTE — Progress Notes (Signed)
Patient ID: Kathryn Wise, female   DOB: [birth year], 78 y.o.   MRN: [id number]     Subjective:  Patient reports pain as mild.  She states that she was able to rest last night and feels okay.  Objective:   VITALS:   Filed Vitals:   07/14/12 1948 07/14/12 2005 07/15/12 0223 07/15/12 0624  BP:  134/82 110/46 100/42  Pulse: 70 69 70 72  Temp:  97.7 F (36.5 C) 97.9 F (36.6 C) 97.9 F (36.6 C)  TempSrc:  Oral    Resp: 14 18 18 18   SpO2: 97% 99% 97% 96%    ABD soft Sensation intact distally Dorsiflexion/Plantar flexion intact Incision: dressing C/D/I and no drainage All motor and sensory function intact  LABS  Results for orders placed during the hospital encounter of 07/14/12 (from the past 24 hour(s))  CBC     Status: Abnormal   Collection Time   07/15/12  5:37 AM      Component Value Range   WBC 9.0  4.0 - 10.5 K/uL   RBC 3.66 (*) 3.87 - 5.11 MIL/uL   Hemoglobin 10.8 (*) 12.0 - 15.0 g/dL   HCT 40.9 (*) 81.1 - 91.4 %   MCV 90.4  78.0 - 100.0 fL   MCH 29.5  26.0 - 34.0 pg   MCHC 32.6  30.0 - 36.0 g/dL   RDW 78.2  95.6 - 21.3 %   Platelets 172  150 - 400 K/uL  BASIC METABOLIC PANEL     Status: Abnormal   Collection Time   07/15/12  5:37 AM      Component Value Range   Sodium 138  135 - 145 mEq/L   Potassium 4.6  3.5 - 5.1 mEq/L   Chloride 103  96 - 112 mEq/L   CO2 25  19 - 32 mEq/L   Glucose, Bld 137 (*) 70 - 99 mg/dL   BUN 15  6 - 23 mg/dL   Creatinine, Ser 0.86  0.50 - 1.10 mg/dL   Calcium 9.0  8.4 - 57.8 mg/dL   GFR calc non Af Amer 64 (*) >90 mL/min   GFR calc Af Amer 74 (*) >90 mL/min    Dg Shoulder Left Port  07/14/2012  *RADIOLOGY REPORT*  Clinical Data: Postop left shoulder arthroplasty  PORTABLE LEFT SHOULDER - 2+ VIEW  Comparison: None.  Findings: Left shoulder arthroplasty in satisfactory position.  No evidence of hardware complication.  No fracture or dislocation is seen.  Mild degenerate changes of the acromioclavicular joint.  Visualized left  lung is clear.  IMPRESSION: Left shoulder arthroplasty in satisfactory position.   Original Report Authenticated By: Charline Bills, M.D.     Assessment/Plan: 1 Day Post-Op   Principal Problem:  *Osteoarthritis of left shoulder   Advance diet Up with therapy May plan for DC tomorrow, possibly later today if passes PT, but she lives alone, although she has family help. NWB left upper ext.   Haskel Khan 07/15/2012, 7:48 AM   Teryl Lucy, MD Cell [phone] Pager [phone]

## 2012-07-15 NOTE — Progress Notes (Signed)
Orthopedic Tech Progress Note Patient Details:  Kathryn Wise 04/13/1930 [id number] Note that patient has already been issued sling immobilizer for Left UE. Patient chose not have OHF applied to bed, stating that she has arthritis in hand and does not have good hand grip. Patient stated that she is moving well enough without it and may be discharged today anyway. Refused intervention.  Patient ID: Kathryn Wise, female   DOB: [birth year], 78 y.o.   MRN: [id number]   Orie Rout 07/15/2012, 10:37 AM

## 2012-07-15 NOTE — Addendum Note (Signed)
Addendum  created 07/15/12 1319 by Adair Laundry, CRNA   Modules edited:Anesthesia Blocks and Procedures, Inpatient Notes

## 2012-07-16 ENCOUNTER — Encounter (HOSPITAL_COMMUNITY): Payer: Self-pay | Admitting: Orthopedic Surgery

## 2012-07-16 MED ORDER — SODIUM CHLORIDE 0.9 % IV BOLUS (SEPSIS)
500.0000 mL | Freq: Once | INTRAVENOUS | Status: AC
  Administered 2012-07-16: 13:00:00 via INTRAVENOUS

## 2012-07-16 MED ORDER — INFLUENZA VIRUS VACC SPLIT PF IM SUSP
0.5000 mL | INTRAMUSCULAR | Status: AC
  Administered 2012-07-17: 0.5 mL via INTRAMUSCULAR
  Filled 2012-07-16: qty 0.5

## 2012-07-16 NOTE — Discharge Summary (Addendum)
Physician Discharge Summary  Patient ID: Kathryn Wise MRN: [id number] DOB/AGE: 03-30-1930 78 y.o.  Admit date: 07/14/2012 Discharge date: 07/17/2012  Admission Diagnoses:  Osteoarthritis of left shoulder  Discharge Diagnoses:  Principal Problem:  *Osteoarthritis of left shoulder   Past Medical History  Diagnosis Date  . Hypothyroidism   . Heart murmur     has had years and years  . Arthritis   . No pertinent past medical history     HX BRONCHITIS   . Osteoarthritis of left shoulder 07/14/2012    Surgeries: Procedure(s): TOTAL SHOULDER ARTHROPLASTY on 07/14/2012   Consultants (if any):    Discharged Condition: Improved  Hospital Course: Kathryn Wise is an 78 y.o. female who was admitted 07/14/2012 with a diagnosis of Osteoarthritis of left shoulder and went to the operating room on 07/14/2012 and underwent the above named procedures.  She stayed overnight 12/18 due to nausea which resolved with IV fluids.    She was given perioperative antibiotics:      Anti-infectives     Start     Dose/Rate Route Frequency Ordered Stop   07/14/12 2200   ceFAZolin (ANCEF) IVPB 2 g/50 mL premix        2 g 100 mL/hr over 30 Minutes Intravenous Every 6 hours 07/14/12 2015 07/15/12 1216   07/13/12 1057   ceFAZolin (ANCEF) IVPB 2 g/50 mL premix        2 g 100 mL/hr over 30 Minutes Intravenous 60 min pre-op 07/13/12 1057 07/14/12 1605        .  She was given sequential compression devices, early ambulation for DVT prophylaxis.  She benefited maximally from the hospital stay and there were no complications.    Recent vital signs:  Filed Vitals:   07/17/12 0502  BP: 142/59  Pulse: 78  Temp: 98.8 F (37.1 C)  Resp: 18    Recent laboratory studies:  Lab Results  Component Value Date   HGB 10.8* 07/15/2012   HGB 14.2 07/09/2012   Lab Results  Component Value Date   WBC 9.0 07/15/2012   PLT 172 07/15/2012   Lab Results  Component Value Date   INR 0.98  07/09/2012   Lab Results  Component Value Date   NA 138 07/15/2012   K 4.6 07/15/2012   CL 103 07/15/2012   CO2 25 07/15/2012   BUN 15 07/15/2012   CREATININE 0.84 07/15/2012   GLUCOSE 137* 07/15/2012    Discharge Medications:     Medication List     As of 07/17/2012 12:48 PM    STOP taking these medications         ARTHROTEC PO      meloxicam 15 MG tablet   Commonly known as: MOBIC      TAKE these medications         BIOFLEX PO   Take 1 tablet by mouth daily.      HYDROcodone-acetaminophen 10-325 MG per tablet   Commonly known as: NORCO   Take 1-2 tablets by mouth every 6 (six) hours as needed for pain.      levothyroxine 88 MCG tablet   Commonly known as: SYNTHROID, LEVOTHROID   Take 88 mcg by mouth every morning.      lisinopril 5 MG tablet   Commonly known as: PRINIVIL,ZESTRIL   Take 5 mg by mouth daily.      multivitamin with minerals Tabs   Take 1 tablet by mouth daily.      POTASSIUM PO  Take 1 tablet by mouth daily.      sennosides-docusate sodium 8.6-50 MG tablet   Commonly known as: SENOKOT-S   Take 1 tablet by mouth daily.      traMADol 50 MG tablet   Commonly known as: ULTRAM   Take 50 mg by mouth every 6 (six) hours as needed. For pain      VIACTIV PO   Take 1 tablet by mouth daily.      VITAMIN B 12 PO   Take 1 tablet by mouth daily.         Diagnostic Studies: Dg Chest 2 View  07/09/2012  *RADIOLOGY REPORT*  Clinical Data: Preop radiograph.  Total shoulder arthroplasty.  CHEST - 2 VIEW  Comparison: None  Findings: The heart size and mediastinal contours are within normal limits.  Both lungs are clear.  The visualized skeletal structures are unremarkable.  IMPRESSION: Negative examination.   Original Report Authenticated By: Signa Kell, M.D.    Dg Shoulder Left Port  07/14/2012  *RADIOLOGY REPORT*  Clinical Data: Postop left shoulder arthroplasty  PORTABLE LEFT SHOULDER - 2+ VIEW  Comparison: None.  Findings: Left shoulder  arthroplasty in satisfactory position.  No evidence of hardware complication.  No fracture or dislocation is seen.  Mild degenerate changes of the acromioclavicular joint.  Visualized left lung is clear.  IMPRESSION: Left shoulder arthroplasty in satisfactory position.   Original Report Authenticated By: Charline Bills, M.D.     Disposition: Final discharge disposition not confirmed  Discharge Orders    Future Orders Please Complete By Expires   Diet general      Call MD / Call 911      Comments:   If you experience chest pain or shortness of breath, CALL 911 and be transported to the hospital emergency room.  If you develope a fever above 101 F, pus (white drainage) or increased drainage or redness at the wound, or calf pain, call your surgeon's office.   Discharge instructions      Comments:   Change dressing in 3 days and reapply fresh dressing, unless you have a splint (half cast).  If you have a splint/cast, just leave in place until your follow-up appointment.    Keep wounds dry for 3 weeks.  Leave steri-strips in place on skin.  Do not apply lotion or anything to the wound.  Use sling at all times.   Constipation Prevention      Comments:   Drink plenty of fluids.  Prune juice may be helpful.  You may use a stool softener, such as Colace (over the counter) 100 mg twice a day.  Use MiraLax (over the counter) for constipation as needed.      Follow-up Information    Schedule an appointment as soon as possible for a visit with Eulas Post, MD.   Contact information:   [address] [place] [address] [phone]           Signed: Eulas Post 07/17/2012, 12:48 PM

## 2012-07-16 NOTE — Care Management Note (Signed)
    Page 1 of 1   07/16/2012     12:06:14 PM   CARE MANAGEMENT NOTE 07/16/2012  Patient:  MUNIRA, POLSON   Account Number:  [id number]  Date Initiated:  07/16/2012  Documentation initiated by:  Letha Cape  Subjective/Objective Assessment:   dx s/p total shoulder arthroplasty  admit-lives alone, but at dc daughter and son will be staying with her.  pta independent.     Action/Plan:   ot eval- no ot needs.   Anticipated DC Date:  07/16/2012   Anticipated DC Plan:  HOME/SELF CARE      DC Planning Services  CM consult      Choice offered to / List presented to:             Status of service:  Completed, signed off Medicare Important Message given?   (If response is "NO", the following Medicare IM given date fields will be blank) Date Medicare IM given:   Date Additional Medicare IM given:    Discharge Disposition:  HOME/SELF CARE  Per UR Regulation:  Reviewed for med. necessity/level of care/duration of stay  If discussed at Long Length of Stay Meetings, dates discussed:    Comments:  07/16/12 12:04 Letha Cape RN, BSN [phone] patient lives alone , but states her daughter and her son will be staying with her at dc, pta she was independent. Per occupational therapy she has no OT needs.  Patient states she has medication coverage and transportation at dc.  No needs anticipated.

## 2012-07-17 NOTE — Progress Notes (Signed)
Patient stayed overnight due to nausea.  Given IV fluid.   Much improved, ambulated, dc home today.  Dc summary addendum added.

## 2012-07-17 NOTE — Progress Notes (Signed)
Discharge Note: Pt is alert and oriented, VS are stable, denies CP.  IV discontinued.  Discharge instructions reviewed with patient, pt verbalizes understanding.  Wheelchair transportation provided, all belongings with patient  

## 2012-08-22 DIAGNOSIS — B029 Zoster without complications: Secondary | ICD-10-CM | POA: Diagnosis not present

## 2012-08-22 DIAGNOSIS — R209 Unspecified disturbances of skin sensation: Secondary | ICD-10-CM | POA: Diagnosis not present

## 2012-08-22 DIAGNOSIS — L259 Unspecified contact dermatitis, unspecified cause: Secondary | ICD-10-CM | POA: Diagnosis not present

## 2012-08-22 DIAGNOSIS — R21 Rash and other nonspecific skin eruption: Secondary | ICD-10-CM | POA: Diagnosis not present

## 2012-08-22 DIAGNOSIS — R52 Pain, unspecified: Secondary | ICD-10-CM | POA: Diagnosis not present

## 2012-09-03 DIAGNOSIS — M6281 Muscle weakness (generalized): Secondary | ICD-10-CM | POA: Diagnosis not present

## 2012-09-03 DIAGNOSIS — M19019 Primary osteoarthritis, unspecified shoulder: Secondary | ICD-10-CM | POA: Diagnosis not present

## 2012-09-03 DIAGNOSIS — M25619 Stiffness of unspecified shoulder, not elsewhere classified: Secondary | ICD-10-CM | POA: Diagnosis not present

## 2012-09-17 DIAGNOSIS — M19019 Primary osteoarthritis, unspecified shoulder: Secondary | ICD-10-CM | POA: Diagnosis not present

## 2012-09-22 DIAGNOSIS — M19019 Primary osteoarthritis, unspecified shoulder: Secondary | ICD-10-CM | POA: Diagnosis not present

## 2012-09-22 DIAGNOSIS — M6281 Muscle weakness (generalized): Secondary | ICD-10-CM | POA: Diagnosis not present

## 2012-09-22 DIAGNOSIS — M25619 Stiffness of unspecified shoulder, not elsewhere classified: Secondary | ICD-10-CM | POA: Diagnosis not present

## 2012-09-23 DIAGNOSIS — M19019 Primary osteoarthritis, unspecified shoulder: Secondary | ICD-10-CM | POA: Diagnosis not present

## 2012-10-01 DIAGNOSIS — M25619 Stiffness of unspecified shoulder, not elsewhere classified: Secondary | ICD-10-CM | POA: Diagnosis not present

## 2012-10-15 DIAGNOSIS — M6281 Muscle weakness (generalized): Secondary | ICD-10-CM | POA: Diagnosis not present

## 2012-10-15 DIAGNOSIS — M19019 Primary osteoarthritis, unspecified shoulder: Secondary | ICD-10-CM | POA: Diagnosis not present

## 2012-10-15 DIAGNOSIS — M25619 Stiffness of unspecified shoulder, not elsewhere classified: Secondary | ICD-10-CM | POA: Diagnosis not present

## 2012-10-29 DIAGNOSIS — M6281 Muscle weakness (generalized): Secondary | ICD-10-CM | POA: Diagnosis not present

## 2012-10-29 DIAGNOSIS — M25619 Stiffness of unspecified shoulder, not elsewhere classified: Secondary | ICD-10-CM | POA: Diagnosis not present

## 2012-10-29 DIAGNOSIS — M19019 Primary osteoarthritis, unspecified shoulder: Secondary | ICD-10-CM | POA: Diagnosis not present

## 2012-11-03 DIAGNOSIS — M25619 Stiffness of unspecified shoulder, not elsewhere classified: Secondary | ICD-10-CM | POA: Diagnosis not present

## 2012-11-03 DIAGNOSIS — M6281 Muscle weakness (generalized): Secondary | ICD-10-CM | POA: Diagnosis not present

## 2012-11-03 DIAGNOSIS — M19019 Primary osteoarthritis, unspecified shoulder: Secondary | ICD-10-CM | POA: Diagnosis not present

## 2012-11-05 ENCOUNTER — Ambulatory Visit: Payer: Self-pay | Admitting: Internal Medicine

## 2012-11-05 ENCOUNTER — Ambulatory Visit (INDEPENDENT_AMBULATORY_CARE_PROVIDER_SITE_OTHER): Payer: Medicare Other | Admitting: Internal Medicine

## 2012-11-05 ENCOUNTER — Telehealth: Payer: Self-pay | Admitting: *Deleted

## 2012-11-05 ENCOUNTER — Encounter: Payer: Self-pay | Admitting: Internal Medicine

## 2012-11-05 VITALS — BP 134/72 | HR 79 | Temp 98.2°F | Resp 16 | Ht 65.0 in | Wt 159.5 lb

## 2012-11-05 DIAGNOSIS — M19012 Primary osteoarthritis, left shoulder: Secondary | ICD-10-CM

## 2012-11-05 DIAGNOSIS — R609 Edema, unspecified: Secondary | ICD-10-CM

## 2012-11-05 DIAGNOSIS — E785 Hyperlipidemia, unspecified: Secondary | ICD-10-CM

## 2012-11-05 DIAGNOSIS — R252 Cramp and spasm: Secondary | ICD-10-CM | POA: Diagnosis not present

## 2012-11-05 DIAGNOSIS — M25619 Stiffness of unspecified shoulder, not elsewhere classified: Secondary | ICD-10-CM | POA: Diagnosis not present

## 2012-11-05 DIAGNOSIS — M6281 Muscle weakness (generalized): Secondary | ICD-10-CM | POA: Diagnosis not present

## 2012-11-05 DIAGNOSIS — M19019 Primary osteoarthritis, unspecified shoulder: Secondary | ICD-10-CM

## 2012-11-05 DIAGNOSIS — E039 Hypothyroidism, unspecified: Secondary | ICD-10-CM

## 2012-11-05 DIAGNOSIS — M79609 Pain in unspecified limb: Secondary | ICD-10-CM | POA: Diagnosis not present

## 2012-11-05 DIAGNOSIS — M25569 Pain in unspecified knee: Secondary | ICD-10-CM | POA: Diagnosis not present

## 2012-11-05 DIAGNOSIS — D62 Acute posthemorrhagic anemia: Secondary | ICD-10-CM

## 2012-11-05 DIAGNOSIS — Z79899 Other long term (current) drug therapy: Secondary | ICD-10-CM

## 2012-11-05 NOTE — Progress Notes (Addendum)
Patient ID: Kathryn Wise, female   DOB: [birth year], 79 y.o.   MRN: [id number]   Patient Active Problem List  Diagnosis  . Osteoarthritis of left shoulder  . Edema  . Postoperative anemia due to acute blood loss    Subjective:  CC:   Chief Complaint  Patient presents with  . Establish Care    HPI:   Kathryn Wise is a 79 y.o. female who presents as a new patient to establish primary care with the chief complaint of Legs cramping.  She is a very active 79 year old white female who returned one week ago from a trip to [place]. The legs began aching and cramping after a walking trip in [place] two days before the trip home . She noticed that her legs were swelling for over a week and had taken a dose or 2 of her friends diuretic which helped. Prior history is notable for a left shoulder replacement in December with a titanium head by Dr.  Dion Saucier.  She has been receiving home physical therapy 2-3 days a week, prescribed by Dr. Dion Saucier.  She was told she would be able to resume golf with chipping and putting by June 1.   She was referred to our office by her daughter-in-law who is a physician in [employer] system. Her prior PCP was a PA at [facility]  HM: her last PAP at age 79.  She receives mammograms annually in October  At [facility] .   Her Last colonoscopy was in 2010 (her 2nd one,  Was normal,  prior one had polyps, Evette Cristal)         Past Medical History  Diagnosis Date  . Hypothyroidism   . Heart murmur     has had years and years  . Arthritis   . No pertinent past medical history     HX BRONCHITIS   . Osteoarthritis of left shoulder 07/14/2012    Past Surgical History  Procedure Laterality Date  . Foot surgery      rt foot   TUMOR REMOVED  . Arthoscopic rotaor cuff repair      LEFT  10+  YEARS    . Cataract extraction w/ intraocular lens implant      RIGHT EYE  . Skin cancer excision    . Facelift    . Total shoulder arthroplasty  07/14/2012    Procedure: TOTAL  SHOULDER ARTHROPLASTY;  Surgeon: Eulas Post, MD;  Location: [facility] OR;  Service: Orthopedics;  Laterality: Left;  . Joint replacement Left Dec 2013    shoulder    Family History  Problem Relation Age of Onset  . Cancer Daughter   . Cancer Son     History   Social History  . Marital Status: Widowed    Spouse Name: N/A    Number of Children: N/A  . Years of Education: N/A   Occupational History  . Not on file.   Social History Main Topics  . Smoking status: Former Smoker    Types: Cigarettes    Quit date: 07/30/1972  . Smokeless tobacco: Not on file     Comment: smoked for only a few months  . Alcohol Use: Yes     Comment: OCCAS WINE   . Drug Use: No  . Sexually Active: Not on file   Other Topics Concern  . Not on file   Social History Narrative  . No narrative on file   Allergies  Allergen Reactions  . Oxycontin (  Oxycodone Hcl Er)      Review of Systems:  Patient denies headache, fevers, malaise, unintentional weight loss, skin rash, eye pain, sinus congestion and sinus pain, sore throat, dysphagia,  hemoptysis , cough, dyspnea, wheezing, chest pain, palpitations, orthopnea, edema, abdominal pain, nausea, melena, diarrhea, constipation, flank pain, dysuria, hematuria, urinary  Frequency, nocturia, numbness, tingling, seizures,  Focal weakness, Loss of consciousness,  Tremor, insomnia, depression, anxiety, and suicidal ideation.     Objective:  BP 134/72  Pulse 79  Temp(Src) 98.2 F (36.8 C) (Oral)  Resp 16  Ht 5\' 5"  (1.651 m)  Wt 159 lb 8 oz (72.349 kg)  BMI 26.54 kg/m2  SpO2 95%  General appearance: alert, cooperative and appears stated age Ears: normal TM's and external ear canals both ears Throat: lips, mucosa, and tongue normal; teeth and gums normal Neck: no adenopathy, no carotid bruit, supple, symmetrical, trachea midline and thyroid not enlarged, symmetric, no tenderness/mass/nodules Back: symmetric, no curvature. ROM normal. No CVA  tenderness. Lungs: clear to auscultation bilaterally Heart: regular rate and rhythm, S1, S2 normal, no murmur, click, rub or gallop Abdomen: soft, non-tender; bowel sounds normal; no masses,  no organomegaly Pulses: 2+ and symmetric. 1+ pitting edema. Positive Homan's sign, left leg Skin: Skin color, texture, turgor normal. No rashes or lesions Lymph nodes: Cervical, supraclavicular, and axillary nodes normal.  Assessment and Plan:  Osteoarthritis of left shoulder Patient's shoulder replacement was a success. She is proceeding nicely with physical therapy and plans to resume golf by June.  Edema Multifactorial, Presumed secondary to venous insufficiency, use of NSAID, and prolonged walking. However given that she had multiple risk factors for DVT I have had both lower extremities evaluated with Doppler ultrasounds which were negative for DVT. Recommended when necessary use of compression stockings when traveling. Elevate legs when not walking.  Postoperative anemia due to acute blood loss Her hemoglobin was noted to drop from 14-10.8 post surgery. Repeat hemoglobin today is normal.  A total of 60 minutes was spent with patient more than half of which was spent in face to face time and included  counseling, reviewing records from other providers and coordination of care.  Updated Medication List Outpatient Encounter Prescriptions as of 11/05/2012  Medication Sig Dispense Refill  . atorvastatin (LIPITOR) 10 MG tablet Take 10 mg by mouth daily.      [name] Bioflavonoid Products (BIOFLEX PO) Take 1 tablet by mouth daily.      . Calcium-Vitamin D-Vitamin K (VIACTIV PO) Take 1 tablet by mouth daily.      . Cyanocobalamin (VITAMIN B 12 PO) Take 1 tablet by mouth daily.      . Diclofenac-Misoprostol (ARTHROTEC) 75-0.2 MG TBEC Take by mouth 1 day or 1 dose.      [name] GLUCOSAMINE CHONDROITIN COMPLX PO Take by mouth 2 (two) times daily.      [name] levothyroxine (SYNTHROID, LEVOTHROID) 88 MCG tablet Take 88 mcg  by mouth every morning.      [name] lisinopril (PRINIVIL,ZESTRIL) 5 MG tablet Take 5 mg by mouth daily.      [name] POTASSIUM PO Take 1 tablet by mouth daily.      . traMADol (ULTRAM) 50 MG tablet Take 50 mg by mouth every 6 (six) hours as needed. For pain      . HYDROcodone-acetaminophen (NORCO) 10-325 MG per tablet Take 1-2 tablets by mouth every 6 (six) hours as needed for pain.  75 tablet  0  . Multiple Vitamin (MULTIVITAMIN WITH MINERALS) TABS Take 1  tablet by mouth daily.      . sennosides-docusate sodium (SENOKOT-S) 8.6-50 MG tablet Take 1 tablet by mouth daily.  30 tablet  1   No facility-administered encounter medications on file as of 11/05/2012.     Orders Placed This Encounter  Procedures  . US Venous Img Lower Bilateral  . Comprehensive metabolic panel  . TSH  . CBC with Differential  . Magnesium  . Lipid panel    No Follow-up on file.

## 2012-11-05 NOTE — Patient Instructions (Addendum)
Return for fasting labs at your convenience.   I am sending you to [facility]  for dopplers of your legs to rule out a blood clot from your recent trip   I am recommending a baby aspirin (81 mg ) once or twice weekly   Return in 6 months for your annul medicare wellness exam   You can take an otc potassium supplement daily until we check your level

## 2012-11-05 NOTE — Telephone Encounter (Signed)
Technician from [facility] called to advise that bilateral leg U/S was negative for clot. After speaking with Dr. Darrick Huntsman technician was advised that patient can home.

## 2012-11-06 ENCOUNTER — Other Ambulatory Visit (INDEPENDENT_AMBULATORY_CARE_PROVIDER_SITE_OTHER): Payer: Medicare Other

## 2012-11-06 DIAGNOSIS — Z79899 Other long term (current) drug therapy: Secondary | ICD-10-CM

## 2012-11-06 DIAGNOSIS — R252 Cramp and spasm: Secondary | ICD-10-CM

## 2012-11-06 DIAGNOSIS — E039 Hypothyroidism, unspecified: Secondary | ICD-10-CM

## 2012-11-06 DIAGNOSIS — E785 Hyperlipidemia, unspecified: Secondary | ICD-10-CM | POA: Diagnosis not present

## 2012-11-06 LAB — CBC WITH DIFFERENTIAL/PLATELET
Basophils Relative: 0.4 % (ref 0.0–3.0)
Eosinophils Relative: 0.9 % (ref 0.0–5.0)
HCT: 38.1 % (ref 36.0–46.0)
Lymphs Abs: 1.6 10*3/uL (ref 0.7–4.0)
MCHC: 33.1 g/dL (ref 30.0–36.0)
MCV: 86.3 fl (ref 78.0–100.0)
Monocytes Absolute: 0.8 10*3/uL (ref 0.1–1.0)
RBC: 4.41 Mil/uL (ref 3.87–5.11)
WBC: 8.2 10*3/uL (ref 4.5–10.5)

## 2012-11-06 LAB — COMPREHENSIVE METABOLIC PANEL
ALT: 12 U/L (ref 0–35)
AST: 16 U/L (ref 0–37)
Albumin: 3.5 g/dL (ref 3.5–5.2)
Alkaline Phosphatase: 64 U/L (ref 39–117)
BUN: 24 mg/dL — ABNORMAL HIGH (ref 6–23)
Creatinine, Ser: 0.8 mg/dL (ref 0.4–1.2)
Potassium: 4.9 mEq/L (ref 3.5–5.1)

## 2012-11-06 LAB — LIPID PANEL
LDL Cholesterol: 77 mg/dL (ref 0–99)
Total CHOL/HDL Ratio: 3
VLDL: 15 mg/dL (ref 0.0–40.0)

## 2012-11-08 ENCOUNTER — Encounter: Payer: Self-pay | Admitting: Internal Medicine

## 2012-11-08 DIAGNOSIS — R609 Edema, unspecified: Secondary | ICD-10-CM | POA: Insufficient documentation

## 2012-11-08 DIAGNOSIS — D62 Acute posthemorrhagic anemia: Secondary | ICD-10-CM | POA: Insufficient documentation

## 2012-11-08 NOTE — Assessment & Plan Note (Addendum)
Multifactorial, Presumed secondary to venous insufficiency, use of NSAID, and prolonged walking. However given that she had multiple risk factors for DVT I have had both lower extremities evaluated with Doppler ultrasounds which were negative for DVT. Recommended when necessary use of compression stockings when traveling. Elevate legs when not walking.

## 2012-11-08 NOTE — Assessment & Plan Note (Signed)
Patient's shoulder replacement was a success. She is proceeding nicely with physical therapy and plans to resume golf by June.

## 2012-11-08 NOTE — Assessment & Plan Note (Signed)
Her hemoglobin was noted to drop from 14-10.8 post surgery. Repeat hemoglobin today is normal.

## 2012-11-10 ENCOUNTER — Telehealth: Payer: Self-pay | Admitting: Internal Medicine

## 2012-11-10 ENCOUNTER — Encounter: Payer: Self-pay | Admitting: General Practice

## 2012-11-10 NOTE — Telephone Encounter (Signed)
Yes, she can being in urine  For a check without a visit.  Her leg pain is unlikely to be viral.   She overused her muscles and ligaments, likely , given the history of walking.  She can try combining aleve and tylenol for pain , and 15 minute soaks in Epsom salts and warm bath for muscle pain , and refer to PT if no improvement in 2 weeks

## 2012-11-10 NOTE — Telephone Encounter (Signed)
Patient Information:  Caller Name: Jasmynn  Phone: [phone]  Patient: Kathryn Wise, Kathryn Wise  Gender: Female  DOB: 04-15-1930  Age: 79 Years  PCP: Duncan Dull (Adults only)  Office Follow Up:  Does the office need to follow up with this patient?: Yes  Instructions For The Office: No appts left with Dr. Darrick Huntsman. Pt does not want to see a different Provider since she just started with Dr. Darrick Huntsman. Is requesting office calls her for next step.    Symptoms  Reason For Call & Symptoms: Pt was seen last week for pain in her legs. Pt was sent for a DVT which was negative. Pt states the pain is worse in her legs and she is now aching all over also. She is calling to check what is the next step. She had been in [place] the week before and is wondering if she just picked up a virus  RN checked EPIC but did not see anything significant on labs except K+. Request sent for Dr. Darrick Huntsman or nurse to call pt back and advise. Only additional symptom is her urine smells bad - onset yesterday. She has no fever/urgency or frequency so thought the odor was from the K+ pills and supplements she started. Since Dr. Darrick Huntsman has no appts/can she just bring in urine spec? Please advise.  Reviewed Health History In EMR: Yes  Reviewed Medications In EMR: Yes  Reviewed Allergies In EMR: Yes  Reviewed Surgeries / Procedures: Yes  Date of Onset of Symptoms: 11/07/2012  Guideline(s) Used:  Urination Pain - Female  Disposition Per Guideline:   See Today in Office  Reason For Disposition Reached:   Age > 50 years  Advice Given:  N/A  Patient Will Follow Care Advice:  YES

## 2012-11-11 ENCOUNTER — Ambulatory Visit (INDEPENDENT_AMBULATORY_CARE_PROVIDER_SITE_OTHER): Payer: Medicare Other | Admitting: Adult Health

## 2012-11-11 ENCOUNTER — Encounter: Payer: Self-pay | Admitting: Adult Health

## 2012-11-11 VITALS — BP 149/70 | HR 84 | Temp 98.0°F | Resp 14 | Wt 159.8 lb

## 2012-11-11 DIAGNOSIS — R52 Pain, unspecified: Secondary | ICD-10-CM

## 2012-11-11 NOTE — Telephone Encounter (Signed)
Patient walked in with C\O pain all over after speaking with Dr, Darrick Huntsman, patient was scheduled to see Orville Govern NP today at 3.

## 2012-11-11 NOTE — Progress Notes (Signed)
  Subjective:    Patient ID: Kathryn Wise, female    DOB: 18-Jun-1930, 79 y.o.   MRN: [id number]  HPI  Patient presents to clinic with complaints of generalized pain. Pain began in the lower extremities making it extremely difficult to get up out of bed. She first noticed the symptoms on Friday. Saturday morning she experienced significant scalp tenderness while combing her hair. She now reports pain "everywhere" - neck, arms, hips and bilateral lower extremities. She has taken tramadol which alleviates the symptoms temporarily. Patient has had recent trouble outside of the country. She denies fever or, chills or rash. Patient discontinued her statin approximately 2 days ago.   Current Outpatient Prescriptions on File Prior to Visit  Medication Sig Dispense Refill  . atorvastatin (LIPITOR) 10 MG tablet Take 10 mg by mouth daily.      [name] Bioflavonoid Products (BIOFLEX PO) Take 1 tablet by mouth daily.      . Calcium-Vitamin D-Vitamin K (VIACTIV PO) Take 1 tablet by mouth daily.      . Cyanocobalamin (VITAMIN B 12 PO) Take 1 tablet by mouth daily.      [name] GLUCOSAMINE CHONDROITIN COMPLX PO Take by mouth 2 (two) times daily.      [name] levothyroxine (SYNTHROID, LEVOTHROID) 88 MCG tablet Take 88 mcg by mouth every morning.      [name] lisinopril (PRINIVIL,ZESTRIL) 5 MG tablet Take 5 mg by mouth daily.      . Multiple Vitamin (MULTIVITAMIN WITH MINERALS) TABS Take 1 tablet by mouth daily.      [name] POTASSIUM PO Take 1 tablet by mouth daily.      . traMADol (ULTRAM) 50 MG tablet Take 50 mg by mouth every 6 (six) hours as needed. For pain       No current facility-administered medications on file prior to visit.     Review of Systems  Constitutional: Negative for fever, chills and appetite change.  HENT: Positive for neck pain and voice change. Negative for congestion, sore throat, rhinorrhea, neck stiffness, postnasal drip and sinus pressure.   Eyes: Negative for visual disturbance.  Respiratory: Positive  for cough. Negative for chest tightness, shortness of breath and wheezing.   Gastrointestinal: Negative.  Negative for nausea and diarrhea.  Genitourinary: Negative.   Musculoskeletal: Positive for myalgias, joint swelling and arthralgias.  Skin: Negative for rash.  Neurological: Negative for weakness, numbness and headaches.  Psychiatric/Behavioral: Negative for behavioral problems, confusion and agitation. The patient is not nervous/anxious.    BP 149/70  Pulse 84  Temp(Src) 98 F (36.7 C) (Oral)  Resp 14  Wt 159 lb 12 oz (72.462 kg)  BMI 26.58 kg/m2  SpO2 96%     Objective:   Physical Exam  Constitutional: She is oriented to person, place, and time. She appears well-developed and well-nourished. No distress.  Cardiovascular: Normal rate.   Pulmonary/Chest: Effort normal and breath sounds normal. No respiratory distress. She has no wheezes.  Musculoskeletal: She exhibits edema.  Edema trace around ankles  Lymphadenopathy:    She has no cervical adenopathy.  Neurological: She is alert and oriented to person, place, and time.  Skin: Skin is warm and dry.  Psychiatric: She has a normal mood and affect. Her behavior is normal. Judgment and thought content normal.       Assessment & Plan:

## 2012-11-11 NOTE — Assessment & Plan Note (Signed)
Acute onset of generalized pain. Uncertain if this may be coming from her statin. Patient stopped taking this medication 2 days ago. May also be viral versus inflammatory producing these symptoms. Check CK level and sedimentation rate. Patient had recent lab work without any evidence of acute or chronic problems.

## 2012-11-11 NOTE — Patient Instructions (Signed)
  Please have labs drawn prior to leaving the office.  We will contact you with the results once they're available.  Continue to take the tramadol as ordered for pain.

## 2012-11-12 LAB — CK: Total CK: 84 U/L (ref 7–177)

## 2012-11-12 LAB — SEDIMENTATION RATE: Sed Rate: 62 mm/hr — ABNORMAL HIGH (ref 0–22)

## 2012-11-13 ENCOUNTER — Telehealth: Payer: Self-pay | Admitting: Internal Medicine

## 2012-11-13 ENCOUNTER — Other Ambulatory Visit: Payer: Self-pay | Admitting: Adult Health

## 2012-11-13 MED ORDER — MELOXICAM 7.5 MG PO TABS: 7.5000 mg | ORAL_TABLET | Freq: Every day | ORAL | Status: DC

## 2012-11-13 NOTE — Telephone Encounter (Signed)
Caller: Kathryn Wise/Patient; Phone: [phone]; Reason for Call: Patient calls to give permission for Dr.  Darrick Huntsman or any designated office staff to discuss her health or health record with Dr.  Chales Abrahams Contogiannis.  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

## 2012-11-13 NOTE — Telephone Encounter (Signed)
Pt called and wanted a nurse to speak with her family member who is also a provider. Dr. Corrie Dandy Contogianis states that the pt told her that her pain has spread into her upper extremities. States that she is in such sever pain that she was unable to complete her PT at [facility] this week due to severe shoulder pain. Wanted to know if we were going to start the patient on an antibiotic. Please call patient as soon as possible.

## 2012-11-18 ENCOUNTER — Ambulatory Visit (INDEPENDENT_AMBULATORY_CARE_PROVIDER_SITE_OTHER): Payer: Medicare Other | Admitting: Internal Medicine

## 2012-11-18 ENCOUNTER — Encounter: Payer: Self-pay | Admitting: Internal Medicine

## 2012-11-18 VITALS — BP 140/72 | HR 78 | Temp 97.6°F | Resp 15 | Wt 160.5 lb

## 2012-11-18 DIAGNOSIS — E538 Deficiency of other specified B group vitamins: Secondary | ICD-10-CM

## 2012-11-18 DIAGNOSIS — IMO0001 Reserved for inherently not codable concepts without codable children: Secondary | ICD-10-CM

## 2012-11-18 DIAGNOSIS — M2550 Pain in unspecified joint: Secondary | ICD-10-CM

## 2012-11-18 DIAGNOSIS — M129 Arthropathy, unspecified: Secondary | ICD-10-CM | POA: Diagnosis not present

## 2012-11-18 DIAGNOSIS — M1990 Unspecified osteoarthritis, unspecified site: Secondary | ICD-10-CM

## 2012-11-18 DIAGNOSIS — M353 Polymyalgia rheumatica: Secondary | ICD-10-CM | POA: Insufficient documentation

## 2012-11-18 DIAGNOSIS — M791 Myalgia, unspecified site: Secondary | ICD-10-CM

## 2012-11-18 LAB — SEDIMENTATION RATE: Sed Rate: 81 mm/hr — ABNORMAL HIGH (ref 0–22)

## 2012-11-18 LAB — CK: Total CK: 76 U/L (ref 7–177)

## 2012-11-18 LAB — VITAMIN B12: Vitamin B-12: 934 pg/mL — ABNORMAL HIGH (ref 211–911)

## 2012-11-18 MED ORDER — HYDROCODONE-ACETAMINOPHEN 10-325 MG PO TABS: 1.0000 | ORAL_TABLET | Freq: Three times a day (TID) | ORAL | Status: DC | PRN

## 2012-11-18 NOTE — Progress Notes (Signed)
Patient ID: Kathryn Wise, female   DOB: [birth year], 79 y.o.   MRN: [id number]  Patient Active Problem List  Diagnosis  . Osteoarthritis of left shoulder  . Edema  . Postoperative anemia due to acute blood loss  . Pain  . Polyarthralgia    Subjective:  CC:   Chief Complaint  Patient presents with  . Follow-up    pain all over patient states X 3 weeks    HPI:   Kathryn Wise a 79 y.o. female who presents with persistent Severe diffuse pain.  She is an 79 yr  Old healthy female who was seen as a new patient on April 9 with cc of bilateral lower extremity pain which started during a walking trip in  [place] one week prior to presentation.   DVT ruled out bilaterally on same day.  She subsequently developed scalp soreness followed by right shoulder pain, bilateral knee pain, wrist pain and ankle pain.  Left shoulder less painful , has a titanium shoulder.  He has been unable to continue PT due to severe diffuse body aches.  Denies headache, visual changes, URI symptoms, muscle twitching, muscle weakness , fever, abdominal pain, bowel or bladder habit changes.  Was seen by NP last week.  CK was normal and ESR was elevated at 60 (normal for age).  Her daughter has accompanied her today and has been researching the internet and has derived the diagnosis of PMR.      Past Medical History  Diagnosis Date  . Hypothyroidism   . Heart murmur     has had years and years  . Arthritis   . No pertinent past medical history     HX BRONCHITIS   . Osteoarthritis of left shoulder 07/14/2012    Past Surgical History  Procedure Laterality Date  . Foot surgery      rt foot   TUMOR REMOVED  . Arthoscopic rotaor cuff repair      LEFT  10+  YEARS    . Cataract extraction w/ intraocular lens implant      RIGHT EYE  . Skin cancer excision    . Facelift    . Total shoulder arthroplasty  07/14/2012    Procedure: TOTAL SHOULDER ARTHROPLASTY;  Surgeon: Eulas Post, MD;  Location: [facility] OR;  Service:  Orthopedics;  Laterality: Left;  . Joint replacement Left Dec 2013    shoulder       The following portions of the patient's history were reviewed and updated as appropriate: Allergies, current medications, and problem list.    Review of Systems:   Patient denies headache, fevers, malaise, unintentional weight loss, skin rash, eye pain, sinus congestion and sinus pain, sore throat, dysphagia,  hemoptysis , cough, dyspnea, wheezing, chest pain, palpitations, orthopnea, edema, abdominal pain, nausea, melena, diarrhea, constipation, flank pain, dysuria, hematuria, urinary  Frequency, nocturia, numbness, tingling, seizures,  Focal weakness, Loss of consciousness,  Tremor, insomnia, depression, anxiety, and suicidal ideation.    History   Social History  . Marital Status: Widowed    Spouse Name: N/A    Number of Children: N/A  . Years of Education: N/A   Occupational History  . Not on file.   Social History Main Topics  . Smoking status: Former Smoker    Types: Cigarettes    Quit date: 07/30/1972  . Smokeless tobacco: Not on file     Comment: smoked for only a few months  . Alcohol Use: Yes     Comment:  OCCAS WINE   . Drug Use: No  . Sexually Active: Not on file   Other Topics Concern  . Not on file   Social History Narrative  . No narrative on file    Objective:  BP 140/72  Pulse 78  Temp(Src) 97.6 F (36.4 C) (Oral)  Resp 15  Wt 160 lb 8 oz (72.802 kg)  BMI 26.71 kg/m2  SpO2 99%  General appearance: alert, cooperative and appears stated age Ears: normal TM's and external ear canals both ears Throat: lips, mucosa, and tongue normal; teeth and gums normal Neck: no adenopathy, no carotid bruit, supple, symmetrical, trachea midline and thyroid not enlarged, symmetric, no tenderness/mass/nodules Back: symmetric, no curvature. ROM normal. No CVA tenderness. Lungs: clear to auscultation bilaterally Heart: regular rate and rhythm, S1, S2 normal, no murmur, click,  rub or gallop Abdomen: soft, non-tender; bowel sounds normal; no masses,  no organomegaly Pulses: 2+ and symmetric Skin: Skin color, texture, turgor normal. No rashes or lesions Lymph nodes: Cervical, supraclavicular, and axillary nodes normal.  Assessment and Plan:  Polyarthralgia Her exam is notable for synovitis of multiple PIPs bilaterally, and pain without synovitis of multiple joints including ankles, elbows, knees and right shoulder. Neurologic exam is normal. Strength is normal.  No fasciculations.  Pulses are symmetric and extremities are well perfused.  No abdominal pulsations or bruits. Repeat Ck is normal  ESR is 81 and CRP is normal.   I reminded patient and daughter that her ESR of 60 to 58 is normal for an 79 yr old woman  but will screen for autoimmune etiologies and refer to Rheumatology as requested by patient and daughter. Since tramadol has been ineffective,  Will rx vicodin and meloxicam .   A total of 30 minutes was spent with patient more than half of which was spent in counseling, reviewing records from other prviders and coordination of care.  Updated Medication List Outpatient Encounter Prescriptions as of 11/18/2012  Medication Sig Dispense Refill  . Bioflavonoid Products (BIOFLEX PO) Take 1 tablet by mouth daily.      . Calcium-Vitamin D-Vitamin K (VIACTIV PO) Take 1 tablet by mouth daily.      . Cyanocobalamin (VITAMIN B 12 PO) Take 1 tablet by mouth daily.      [name] GLUCOSAMINE CHONDROITIN COMPLX PO Take by mouth 2 (two) times daily.      [name] ibuprofen (ADVIL,MOTRIN) 200 MG tablet Take 600 mg by mouth every 4 (four) hours as needed for pain.      [name] levothyroxine (SYNTHROID, LEVOTHROID) 88 MCG tablet Take 88 mcg by mouth every morning.      . Multiple Vitamin (MULTIVITAMIN WITH MINERALS) TABS Take 1 tablet by mouth daily.      [name] POTASSIUM PO Take 1 tablet by mouth daily.      . traMADol (ULTRAM) 50 MG tablet Take 50 mg by mouth every 6 (six) hours as needed. For pain       . HYDROcodone-acetaminophen (NORCO) 10-325 MG per tablet Take 1 tablet by mouth every 8 (eight) hours as needed for pain.  90 tablet  1  . lisinopril (PRINIVIL,ZESTRIL) 5 MG tablet Take 5 mg by mouth daily.      . [DISCONTINUED] atorvastatin (LIPITOR) 10 MG tablet Take 10 mg by mouth daily.      . [DISCONTINUED] meloxicam (MOBIC) 7.5 MG tablet Take 1 tablet (7.5 mg total) by mouth daily.  30 tablet  0   No facility-administered encounter medications on file as of  11/18/2012.     Orders Placed This Encounter  Procedures  . Vitamin B12  . C-reactive protein  . Magnesium  . ANA  . Sedimentation rate  . Cyclic citrul peptide antibody, IgG  . Rheumatoid factor  . CK  . Ambulatory referral to Rheumatology    No Follow-up on file.

## 2012-11-18 NOTE — Patient Instructions (Addendum)
  Reduce your ibuprofen to 600 mg every 8 hours  vicodin 10/325 one tablet every 8 hours.  (combine with ibuprofen)  Blood work today in anticipation of rheumatology appt

## 2012-11-18 NOTE — Assessment & Plan Note (Addendum)
Her exam is notable for synovitis of multiple PIPs bilaterally, and pain without synovitis of multiple joints including ankles, elbows, knees and right shoulder. Neurologic exam is normal. Strength is normal.  No fasciculations.  Pulses are symmetric and extremities are well perfused.  No abdominal pulsations or bruits.  I reminded patient and daughter that an ESR of 64 in an 79 yr old woman is actually normal, but will screen for autoimmune etiologies and refer to Rheumatology as requested by patient and daughter. Since tramadol has been ineffective,  Will rx vicodin and meloxicam .

## 2012-11-19 ENCOUNTER — Telehealth: Payer: Self-pay | Admitting: *Deleted

## 2012-11-19 DIAGNOSIS — M549 Dorsalgia, unspecified: Secondary | ICD-10-CM

## 2012-11-19 DIAGNOSIS — R1013 Epigastric pain: Secondary | ICD-10-CM

## 2012-11-19 DIAGNOSIS — M79603 Pain in arm, unspecified: Secondary | ICD-10-CM

## 2012-11-19 DIAGNOSIS — R079 Chest pain, unspecified: Secondary | ICD-10-CM

## 2012-11-19 DIAGNOSIS — M79605 Pain in left leg: Secondary | ICD-10-CM

## 2012-11-19 LAB — RHEUMATOID FACTOR: Rhuematoid fact SerPl-aCnc: <10 IU/mL (ref <=14)

## 2012-11-19 LAB — ANA: Anti Nuclear Antibody(ANA): NEGATIVE

## 2012-11-19 MED ORDER — ONDANSETRON HCL 4 MG PO TABS: 4.0000 mg | ORAL_TABLET | Freq: Three times a day (TID) | ORAL | Status: DC | PRN

## 2012-11-19 NOTE — Telephone Encounter (Signed)
I sent a medication to pharmacy for nausea that is not sedating. She can take it every 8 hours.  I would like to order the CT scan to be on the safe side.  Amber will call her with the appt for the CT scan   [facility] done at [facility],  Not [facility]

## 2012-11-19 NOTE — Telephone Encounter (Signed)
Patient's daughter called stating that mom got a phone call today with her results and wanted to confirm what she was told. Results given to [facility]. Debbie wanted to let you know that her mom is feeling and walking better with the pain medication, but it is causing her to throw up. Debbie that her mom is willing to do what ever you recommend. Pharmacy-[facility]

## 2012-11-19 NOTE — Telephone Encounter (Signed)
Patient notified

## 2012-11-21 ENCOUNTER — Other Ambulatory Visit: Payer: Self-pay | Admitting: Internal Medicine

## 2012-11-21 DIAGNOSIS — M79601 Pain in right arm: Secondary | ICD-10-CM

## 2012-11-21 DIAGNOSIS — M79604 Pain in right leg: Secondary | ICD-10-CM

## 2012-11-21 DIAGNOSIS — R079 Chest pain, unspecified: Secondary | ICD-10-CM

## 2012-11-21 DIAGNOSIS — M79602 Pain in left arm: Secondary | ICD-10-CM

## 2012-11-21 DIAGNOSIS — M79605 Pain in left leg: Secondary | ICD-10-CM

## 2012-11-21 DIAGNOSIS — M549 Dorsalgia, unspecified: Secondary | ICD-10-CM

## 2012-11-26 ENCOUNTER — Ambulatory Visit (HOSPITAL_COMMUNITY): Payer: Medicare Other

## 2012-11-26 ENCOUNTER — Ambulatory Visit (HOSPITAL_COMMUNITY)
Admission: RE | Admit: 2012-11-26 | Discharge: 2012-11-26 | Disposition: A | Discharge status: Home / Self Care | Payer: Medicare Other | Source: Ambulatory Visit | Attending: Internal Medicine | Admitting: Internal Medicine

## 2012-11-26 ENCOUNTER — Encounter (HOSPITAL_COMMUNITY): Payer: Self-pay

## 2012-11-26 DIAGNOSIS — K5730 Diverticulosis of large intestine without perforation or abscess without bleeding: Secondary | ICD-10-CM | POA: Diagnosis not present

## 2012-11-26 DIAGNOSIS — M79602 Pain in left arm: Secondary | ICD-10-CM

## 2012-11-26 DIAGNOSIS — R109 Unspecified abdominal pain: Secondary | ICD-10-CM | POA: Diagnosis not present

## 2012-11-26 DIAGNOSIS — M79609 Pain in unspecified limb: Secondary | ICD-10-CM | POA: Diagnosis not present

## 2012-11-26 DIAGNOSIS — I2510 Atherosclerotic heart disease of native coronary artery without angina pectoris: Secondary | ICD-10-CM | POA: Insufficient documentation

## 2012-11-26 DIAGNOSIS — R079 Chest pain, unspecified: Secondary | ICD-10-CM | POA: Insufficient documentation

## 2012-11-26 DIAGNOSIS — M79605 Pain in left leg: Secondary | ICD-10-CM

## 2012-11-26 DIAGNOSIS — M549 Dorsalgia, unspecified: Secondary | ICD-10-CM

## 2012-11-26 MED ORDER — IOHEXOL 350 MG/ML SOLN
100.0000 mL | Freq: Once | INTRAVENOUS | Status: AC | PRN
  Administered 2012-11-26: 100 mL via INTRAVENOUS

## 2012-11-27 DIAGNOSIS — M25619 Stiffness of unspecified shoulder, not elsewhere classified: Secondary | ICD-10-CM | POA: Diagnosis not present

## 2012-11-28 ENCOUNTER — Encounter: Payer: Self-pay | Admitting: Internal Medicine

## 2012-11-28 ENCOUNTER — Ambulatory Visit (INDEPENDENT_AMBULATORY_CARE_PROVIDER_SITE_OTHER): Payer: Medicare Other | Admitting: Internal Medicine

## 2012-11-28 VITALS — BP 148/78 | HR 97 | Temp 97.7°F | Resp 16 | Wt 159.5 lb

## 2012-11-28 DIAGNOSIS — Z8619 Personal history of other infectious and parasitic diseases: Secondary | ICD-10-CM | POA: Insufficient documentation

## 2012-11-28 DIAGNOSIS — M2550 Pain in unspecified joint: Secondary | ICD-10-CM | POA: Diagnosis not present

## 2012-11-28 DIAGNOSIS — R609 Edema, unspecified: Secondary | ICD-10-CM | POA: Diagnosis not present

## 2012-11-28 DIAGNOSIS — R011 Cardiac murmur, unspecified: Secondary | ICD-10-CM | POA: Diagnosis not present

## 2012-11-28 MED ORDER — ONDANSETRON HCL 4 MG PO TABS: 4.0000 mg | ORAL_TABLET | Freq: Three times a day (TID) | ORAL | Status: DC | PRN

## 2012-11-28 NOTE — Assessment & Plan Note (Addendum)
Chronic,  For decades per patient , with prior ECHO and stress test done (repeatedly, per patient ) by by [facility].  Records requested. She will need prophylactic antibiotics priro to procedures  Both form endocarditis prophylaxis and prevention of hardware infection from transient bacteremia.

## 2012-11-28 NOTE — Progress Notes (Addendum)
Patient ID: Kathryn Wise, female   DOB: 1929-11-02, 79 y.o.   MRN: [id number]   Patient Active Problem List   Diagnosis Date Noted  . Heart murmur 11/28/2012  . History of shingles   . Polyarthralgia 11/18/2012  . Pain 11/11/2012  . Edema 11/08/2012  . Postoperative anemia due to acute blood loss 11/08/2012  . Osteoarthritis of left shoulder 07/14/2012    Subjective:  CC:   Chief Complaint  Patient presents with  . Follow-up    HPI:   Kathryn Wise a 79 y.o. female who presents for Follow up on recent onset of severe diffuse pain syndrome which started several days after a return from a walking trip in [place] about 4 weeks ago. 79 yr old white female , previously very active,  No pertinent past medical history other than hyperlipidemia and history of left shoulder replacement Dec 2013 who presented as new patient on April 5 with pain in both lower extremities after returning from [place].  Symptoms were isolated to both calves, concerning for DVT and she was sent for  Bilateral U/s of LE which ruled out DVTs.  CBC, CMET, TSH,  And Mg all normal.  Statin was suspended,  And trial of Advil and tylenol recommended.  5 days later she was reevaluated for severe debilitating pain that had spread to her entire body , including neck and scalp, making it difficult for her to get out of bed or walk, and she was  screened for inflammation by NP with an ESR which was 60 and a CK which was normal. In the absence of fever, headache , leukocytosis, and history of tick bite, family's request for empiric antibiotics or prednisone was considered but deferred.  Serologies for autoimmune polyarthralgias were sent .  All were normal,  including CRP, although ESR was now 80. Family very concerned about PMR and Lymes' Disease given elevated ESR (discussed that ESR of 80 can be normal in an 79 yr old) and strange macular rash that was noted on her occipital scalp by her hairdresser during this time. Referral to  rheumatology was made and she was sent for CT of chest, abdomen and pelvis, to rule out dissecting aortic aneurysm.   In the interim she was seen by her orthopedist and received a 12 day prednisone taper starting at 60 mg .  After one dose of prednisone she feels markedly better and is able to walk without pain today.  Family and patient remark that these symptoms are very unusual for her, and even after her shoulder surgery she had a swift recovery to pain free functional level.    Past Medical History  Diagnosis Date  . Hypothyroidism   . Heart murmur     has had years and years  . Arthritis   . No pertinent past medical history     HX BRONCHITIS   . Osteoarthritis of left shoulder 07/14/2012  . History of shingles Dec 2013    treated with steroids , post op from shoulder surgery    Past Surgical History  Procedure Laterality Date  . Foot surgery      rt foot   TUMOR REMOVED  . Arthoscopic rotaor cuff repair      LEFT  10+  YEARS    . Cataract extraction w/ intraocular lens implant      RIGHT EYE  . Skin cancer excision    . Facelift    . Total shoulder arthroplasty  07/14/2012  Procedure: TOTAL SHOULDER ARTHROPLASTY;  Surgeon: Eulas Post, MD;  Location: [facility] OR;  Service: Orthopedics;  Laterality: Left;  . Joint replacement Left Dec 2013    shoulder       The following portions of the patient's history were reviewed and updated as appropriate: Allergies, current medications, and problem list.    Review of Systems:   Patient denies headache, fevers, malaise, unintentional weight loss, eye pain, sinus congestion and sinus pain, sore throat, dysphagia,  hemoptysis , cough, dyspnea, wheezing, chest pain, palpitations, orthopnea, abdominal pain,  melena, diarrhea, constipation, flank pain, dysuria, hematuria, urinary frequency, nocturia, numbness, tingling, seizures,  Focal weakness, Loss of consciousness,  Tremor, insomnia, depression, anxiety, and suicidal ideation.      History   Social History  . Marital Status: Widowed    Spouse Name: N/A    Number of Children: N/A  . Years of Education: N/A   Occupational History  . Not on file.   Social History Main Topics  . Smoking status: Former Smoker    Types: Cigarettes    Quit date: 07/30/1972  . Smokeless tobacco: Not on file     Comment: smoked for only a few months  . Alcohol Use: Yes     Comment: OCCAS WINE   . Drug Use: No  . Sexually Active: Not on file   Other Topics Concern  . Not on file   Social History Narrative  . No narrative on file    Objective:  BP 148/78  Pulse 97  Temp(Src) 97.7 F (36.5 C) (Oral)  Resp 16  Wt 159 lb 8 oz (72.349 kg)  BMI 26.54 kg/m2  SpO2 97%  General appearance: alert, cooperative and appears stated age Ears: normal TM's and external ear canals both ears Throat: lips, mucosa, and tongue normal; teeth and gums normal Neck: no adenopathy, no carotid bruit, supple, symmetrical, trachea midline and thyroid not enlarged, symmetric, no tenderness/mass/nodules Back: symmetric, no curvature. ROM normal. No CVA tenderness. Lungs: clear to auscultation bilaterally Heart: regular rate and rhythm, Grade 3/6 systolic murmur loudest at LSB.  Abdomen: soft, non-tender; bowel sounds normal; no masses,  no organomegaly Pulses: 2+ and symmetric Skin: macular salmon colored rash on occipital scalp.  Lymph nodes: Cervical, supraclavicular, and axillary nodes normal. MSK:  No signs of synovitis on exam.   Assessment and Plan:  Heart murmur Chronic,  For decades per patient , with prior ECHO and stress test done (repeatedly, per patient ) by by [facility].  Records requested. She will need prophylactic antibiotics priro to procedures  Both form endocarditis prophylaxis and prevention of hardware infection from transient bacteremia.  Polyarthralgia Etiology may be PMR.  I had hoped to avoid using prednisone until Rheumatology had seen her, but she started a  dose pack prescribed by her orthopedist on Thursday May 1 and she is feeling better after one dose.   Prior trials of NSAID, tramadol and vicodin were temprarily helpful with vicodin helping the most but causing nausea .  Discussed with patient and daughter the possibility of Lyme's Disease , which I think is unlikely but suggested she return in two weeks to check IgM titres or have Dr. Kellie Simmering check it next week.   Edema Mild, new onset,  Likely secondary to NSAIDs and prednisone.  Leg elevation recommended .   A total of 45 minutes was spent with patient more than half of which was spent in counseling, reviewing records from other prviders and coordination of care. Updated Medication  List Outpatient Encounter Prescriptions as of 11/28/2012  Medication Sig Dispense Refill  . Bioflavonoid Products (BIOFLEX PO) Take 1 tablet by mouth daily.      . Calcium-Vitamin D-Vitamin K (VIACTIV PO) Take 1 tablet by mouth daily.      . Cyanocobalamin (VITAMIN B 12 PO) Take 1 tablet by mouth daily.      [name] GLUCOSAMINE CHONDROITIN COMPLX PO Take by mouth 2 (two) times daily.      [name] ibuprofen (ADVIL,MOTRIN) 200 MG tablet Take 600 mg by mouth every 4 (four) hours as needed for pain.      [name] levothyroxine (SYNTHROID, LEVOTHROID) 88 MCG tablet Take 88 mcg by mouth every morning.      . Multiple Vitamin (MULTIVITAMIN WITH MINERALS) TABS Take 1 tablet by mouth daily.      . ondansetron (ZOFRAN) 4 MG tablet Take 1 tablet (4 mg total) by mouth every 8 (eight) hours as needed for nausea.  45 tablet  2  . POTASSIUM PO Take 1 tablet by mouth daily.      . predniSONE (DELTASONE) 10 MG tablet Take 10 mg by mouth daily. Patient taper pack 12 days      . traMADol (ULTRAM) 50 MG tablet Take 50 mg by mouth every 6 (six) hours as needed. For pain      . [DISCONTINUED] ondansetron (ZOFRAN) 4 MG tablet Take 1 tablet (4 mg total) by mouth every 8 (eight) hours as needed for nausea.  20 tablet  0  . HYDROcodone-acetaminophen (NORCO)  10-325 MG per tablet Take 1 tablet by mouth every 8 (eight) hours as needed for pain.  90 tablet  1  . lisinopril (PRINIVIL,ZESTRIL) 5 MG tablet Take 5 mg by mouth daily.       No facility-administered encounter medications on file as of 11/28/2012.     No orders of the defined types were placed in this encounter.    No Follow-up on file.

## 2012-11-30 NOTE — Assessment & Plan Note (Signed)
Etiology may be PMR.  I had hoped to avoid using prednisone until Rheumatology had seen her, but she started a dose pack prescribed by her orthopedist and she is feeling better after one dose.   Prior trials of NSAID, tramadol and vicodin were temprarily helpful with vicodin helping the most but causing nausea .  Discussed with patient and daughter the possibility of Lyme's Disease , which I think is unlikely but suggested she return in two weeks to check IgM titres or have Dr. Kellie Simmering check it next week.

## 2012-11-30 NOTE — Assessment & Plan Note (Addendum)
Mild, new onset,  Likely secondary to NSAIDs and prednisone.  Leg elevation recommended .

## 2012-12-02 DIAGNOSIS — M25579 Pain in unspecified ankle and joints of unspecified foot: Secondary | ICD-10-CM | POA: Diagnosis not present

## 2012-12-02 DIAGNOSIS — M19049 Primary osteoarthritis, unspecified hand: Secondary | ICD-10-CM | POA: Diagnosis not present

## 2012-12-02 DIAGNOSIS — Z79899 Other long term (current) drug therapy: Secondary | ICD-10-CM | POA: Diagnosis not present

## 2012-12-02 DIAGNOSIS — M353 Polymyalgia rheumatica: Secondary | ICD-10-CM | POA: Diagnosis not present

## 2012-12-04 ENCOUNTER — Encounter: Payer: Self-pay | Admitting: Internal Medicine

## 2012-12-09 NOTE — Telephone Encounter (Signed)
Patient seen in office 11/28/12 bu Dr. Darrick Huntsman for follow up

## 2012-12-29 ENCOUNTER — Other Ambulatory Visit: Payer: Self-pay | Admitting: Rheumatology

## 2012-12-29 ENCOUNTER — Ambulatory Visit
Admission: RE | Admit: 2012-12-29 | Discharge: 2012-12-29 | Disposition: A | Discharge status: Home / Self Care | Payer: Medicare Other | Source: Ambulatory Visit | Attending: Rheumatology | Admitting: Rheumatology

## 2012-12-29 DIAGNOSIS — R0602 Shortness of breath: Secondary | ICD-10-CM

## 2012-12-29 DIAGNOSIS — M545 Low back pain, unspecified: Secondary | ICD-10-CM

## 2012-12-29 DIAGNOSIS — M67919 Unspecified disorder of synovium and tendon, unspecified shoulder: Secondary | ICD-10-CM | POA: Diagnosis not present

## 2012-12-29 DIAGNOSIS — M353 Polymyalgia rheumatica: Secondary | ICD-10-CM | POA: Diagnosis not present

## 2012-12-29 DIAGNOSIS — Z79899 Other long term (current) drug therapy: Secondary | ICD-10-CM | POA: Diagnosis not present

## 2012-12-29 DIAGNOSIS — M5137 Other intervertebral disc degeneration, lumbosacral region: Secondary | ICD-10-CM | POA: Diagnosis not present

## 2012-12-29 DIAGNOSIS — I749 Embolism and thrombosis of unspecified artery: Secondary | ICD-10-CM | POA: Diagnosis not present

## 2013-01-09 ENCOUNTER — Other Ambulatory Visit: Payer: Self-pay | Admitting: *Deleted

## 2013-01-09 MED ORDER — LEVOTHYROXINE SODIUM 88 MCG PO TABS: 88.0000 ug | ORAL_TABLET | Freq: Every morning | ORAL | Status: DC

## 2013-01-29 DIAGNOSIS — M67919 Unspecified disorder of synovium and tendon, unspecified shoulder: Secondary | ICD-10-CM | POA: Diagnosis not present

## 2013-01-29 DIAGNOSIS — M719 Bursopathy, unspecified: Secondary | ICD-10-CM | POA: Diagnosis not present

## 2013-01-29 DIAGNOSIS — I749 Embolism and thrombosis of unspecified artery: Secondary | ICD-10-CM | POA: Diagnosis not present

## 2013-01-29 DIAGNOSIS — M545 Low back pain: Secondary | ICD-10-CM | POA: Diagnosis not present

## 2013-02-09 ENCOUNTER — Other Ambulatory Visit: Payer: Self-pay | Admitting: Rheumatology

## 2013-02-09 DIAGNOSIS — M545 Low back pain: Secondary | ICD-10-CM

## 2013-02-17 ENCOUNTER — Ambulatory Visit
Admission: RE | Admit: 2013-02-17 | Discharge: 2013-02-17 | Disposition: A | Discharge status: Home / Self Care | Payer: Medicare Other | Source: Ambulatory Visit | Attending: Rheumatology | Admitting: Rheumatology

## 2013-02-17 DIAGNOSIS — M48061 Spinal stenosis, lumbar region without neurogenic claudication: Secondary | ICD-10-CM | POA: Diagnosis not present

## 2013-02-17 DIAGNOSIS — M5137 Other intervertebral disc degeneration, lumbosacral region: Secondary | ICD-10-CM | POA: Diagnosis not present

## 2013-02-17 DIAGNOSIS — M545 Low back pain: Secondary | ICD-10-CM

## 2013-03-10 DIAGNOSIS — M899 Disorder of bone, unspecified: Secondary | ICD-10-CM | POA: Diagnosis not present

## 2013-03-10 DIAGNOSIS — M67919 Unspecified disorder of synovium and tendon, unspecified shoulder: Secondary | ICD-10-CM | POA: Diagnosis not present

## 2013-03-10 DIAGNOSIS — M4800 Spinal stenosis, site unspecified: Secondary | ICD-10-CM | POA: Diagnosis not present

## 2013-03-10 DIAGNOSIS — M719 Bursopathy, unspecified: Secondary | ICD-10-CM | POA: Diagnosis not present

## 2013-03-10 DIAGNOSIS — I749 Embolism and thrombosis of unspecified artery: Secondary | ICD-10-CM | POA: Diagnosis not present

## 2013-03-26 DIAGNOSIS — M79609 Pain in unspecified limb: Secondary | ICD-10-CM | POA: Diagnosis not present

## 2013-03-26 DIAGNOSIS — IMO0002 Reserved for concepts with insufficient information to code with codable children: Secondary | ICD-10-CM | POA: Diagnosis not present

## 2013-03-26 DIAGNOSIS — M1710 Unilateral primary osteoarthritis, unspecified knee: Secondary | ICD-10-CM | POA: Diagnosis not present

## 2013-04-28 DIAGNOSIS — Z85828 Personal history of other malignant neoplasm of skin: Secondary | ICD-10-CM | POA: Diagnosis not present

## 2013-04-28 DIAGNOSIS — D239 Other benign neoplasm of skin, unspecified: Secondary | ICD-10-CM | POA: Diagnosis not present

## 2013-04-28 DIAGNOSIS — B359 Dermatophytosis, unspecified: Secondary | ICD-10-CM | POA: Diagnosis not present

## 2013-04-28 DIAGNOSIS — L905 Scar conditions and fibrosis of skin: Secondary | ICD-10-CM | POA: Diagnosis not present

## 2013-04-28 DIAGNOSIS — L820 Inflamed seborrheic keratosis: Secondary | ICD-10-CM | POA: Diagnosis not present

## 2013-04-28 DIAGNOSIS — L821 Other seborrheic keratosis: Secondary | ICD-10-CM | POA: Diagnosis not present

## 2013-04-28 DIAGNOSIS — L570 Actinic keratosis: Secondary | ICD-10-CM | POA: Diagnosis not present

## 2013-04-28 DIAGNOSIS — L819 Disorder of pigmentation, unspecified: Secondary | ICD-10-CM | POA: Diagnosis not present

## 2013-05-04 DIAGNOSIS — M4800 Spinal stenosis, site unspecified: Secondary | ICD-10-CM | POA: Diagnosis not present

## 2013-05-04 DIAGNOSIS — M67919 Unspecified disorder of synovium and tendon, unspecified shoulder: Secondary | ICD-10-CM | POA: Diagnosis not present

## 2013-05-04 DIAGNOSIS — I749 Embolism and thrombosis of unspecified artery: Secondary | ICD-10-CM | POA: Diagnosis not present

## 2013-05-04 DIAGNOSIS — M899 Disorder of bone, unspecified: Secondary | ICD-10-CM | POA: Diagnosis not present

## 2013-05-29 DIAGNOSIS — Z23 Encounter for immunization: Secondary | ICD-10-CM | POA: Diagnosis not present

## 2013-07-07 DIAGNOSIS — M4800 Spinal stenosis, site unspecified: Secondary | ICD-10-CM | POA: Diagnosis not present

## 2013-07-07 DIAGNOSIS — M545 Low back pain: Secondary | ICD-10-CM | POA: Diagnosis not present

## 2013-07-13 ENCOUNTER — Telehealth: Payer: Self-pay | Admitting: *Deleted

## 2013-07-13 DIAGNOSIS — R079 Chest pain, unspecified: Secondary | ICD-10-CM | POA: Diagnosis not present

## 2013-07-13 MED ORDER — DICLOFENAC-MISOPROSTOL 75-0.2 MG PO TBEC: 1.0000 | DELAYED_RELEASE_TABLET | Freq: Every day | ORAL | Status: DC

## 2013-07-13 NOTE — Telephone Encounter (Signed)
Patient came in stating she had fallen and believed she had hurt her ribs as before and that she was afraid might be broken advised patient she should go to ER or Urgent care no appointments available plus she would need xray to rule out fracture. Patient also ask if you would refill her Diclofenac- Misoprostol 75-0.2 Mg Patient stated was filled by [facility]. Last OV 12/18/12

## 2013-07-13 NOTE — Telephone Encounter (Signed)
Sent refill for medication as requested per Dr. Darrick Huntsman.

## 2013-07-13 NOTE — Telephone Encounter (Signed)
i do not have  The diclofenac on her med list but you can refill if she needs it.  If she will not go to ER I can order the ri b films ,  Which side?

## 2013-08-10 DIAGNOSIS — H2510 Age-related nuclear cataract, unspecified eye: Secondary | ICD-10-CM | POA: Diagnosis not present

## 2013-08-26 DIAGNOSIS — M25819 Other specified joint disorders, unspecified shoulder: Secondary | ICD-10-CM | POA: Diagnosis not present

## 2013-08-26 DIAGNOSIS — M542 Cervicalgia: Secondary | ICD-10-CM | POA: Diagnosis not present

## 2013-09-10 ENCOUNTER — Encounter: Payer: Self-pay | Admitting: Internal Medicine

## 2013-09-10 ENCOUNTER — Ambulatory Visit (INDEPENDENT_AMBULATORY_CARE_PROVIDER_SITE_OTHER): Payer: Medicare Other | Admitting: Internal Medicine

## 2013-09-10 VITALS — BP 142/78 | HR 73 | Temp 98.2°F | Resp 16 | Ht 66.0 in | Wt 154.8 lb

## 2013-09-10 DIAGNOSIS — E785 Hyperlipidemia, unspecified: Secondary | ICD-10-CM

## 2013-09-10 DIAGNOSIS — R1314 Dysphagia, pharyngoesophageal phase: Secondary | ICD-10-CM

## 2013-09-10 DIAGNOSIS — Z0000 Encounter for general adult medical examination without abnormal findings: Secondary | ICD-10-CM | POA: Diagnosis not present

## 2013-09-10 DIAGNOSIS — M2550 Pain in unspecified joint: Secondary | ICD-10-CM

## 2013-09-10 DIAGNOSIS — Z1211 Encounter for screening for malignant neoplasm of colon: Secondary | ICD-10-CM

## 2013-09-10 DIAGNOSIS — E559 Vitamin D deficiency, unspecified: Secondary | ICD-10-CM | POA: Diagnosis not present

## 2013-09-10 DIAGNOSIS — IMO0002 Reserved for concepts with insufficient information to code with codable children: Secondary | ICD-10-CM | POA: Diagnosis not present

## 2013-09-10 DIAGNOSIS — Z1239 Encounter for other screening for malignant neoplasm of breast: Secondary | ICD-10-CM

## 2013-09-10 DIAGNOSIS — Z23 Encounter for immunization: Secondary | ICD-10-CM | POA: Diagnosis not present

## 2013-09-10 DIAGNOSIS — K219 Gastro-esophageal reflux disease without esophagitis: Secondary | ICD-10-CM

## 2013-09-10 HISTORY — DX: Dysphagia, pharyngoesophageal phase: R13.14

## 2013-09-10 LAB — TSH: TSH: 0.49 u[IU]/mL (ref 0.35–5.50)

## 2013-09-10 LAB — CBC WITH DIFFERENTIAL/PLATELET
BASOS PCT: 0.5 % (ref 0.0–3.0)
Basophils Absolute: 0 10*3/uL (ref 0.0–0.1)
EOS PCT: 0.8 % (ref 0.0–5.0)
Eosinophils Absolute: 0.1 10*3/uL (ref 0.0–0.7)
HEMATOCRIT: 43.3 % (ref 36.0–46.0)
HEMOGLOBIN: 13.9 g/dL (ref 12.0–15.0)
LYMPHS ABS: 2.1 10*3/uL (ref 0.7–4.0)
Lymphocytes Relative: 23.9 % (ref 12.0–46.0)
MCHC: 32.1 g/dL (ref 30.0–36.0)
MCV: 90.9 fl (ref 78.0–100.0)
MONO ABS: 0.7 10*3/uL (ref 0.1–1.0)
MONOS PCT: 7.8 % (ref 3.0–12.0)
Neutro Abs: 5.8 10*3/uL (ref 1.4–7.7)
Neutrophils Relative %: 67 % (ref 43.0–77.0)
PLATELETS: 206 10*3/uL (ref 150.0–400.0)
RBC: 4.76 Mil/uL (ref 3.87–5.11)
RDW: 15.5 % — ABNORMAL HIGH (ref 11.5–14.6)
WBC: 8.7 10*3/uL (ref 4.5–10.5)

## 2013-09-10 LAB — COMPREHENSIVE METABOLIC PANEL
ALK PHOS: 54 U/L (ref 39–117)
ALT: 17 U/L (ref 0–35)
AST: 17 U/L (ref 0–37)
Albumin: 4 g/dL (ref 3.5–5.2)
BILIRUBIN TOTAL: 1 mg/dL (ref 0.3–1.2)
BUN: 17 mg/dL (ref 6–23)
CO2: 28 meq/L (ref 19–32)
CREATININE: 0.9 mg/dL (ref 0.4–1.2)
Calcium: 9.6 mg/dL (ref 8.4–10.5)
Chloride: 105 mEq/L (ref 96–112)
GFR: 61.43 mL/min (ref >60.00)
GLUCOSE: 78 mg/dL (ref 70–99)
Potassium: 4.8 mEq/L (ref 3.5–5.1)
SODIUM: 141 meq/L (ref 135–145)
TOTAL PROTEIN: 6.9 g/dL (ref 6.0–8.3)

## 2013-09-10 LAB — LIPID PANEL
Cholesterol: 274 mg/dL — ABNORMAL HIGH (ref 0–200)
HDL: 51.8 mg/dL (ref >39.00)
Total CHOL/HDL Ratio: 5
Triglycerides: 121 mg/dL (ref 0.0–149.0)
VLDL: 24.2 mg/dL (ref 0.0–40.0)

## 2013-09-10 LAB — SEDIMENTATION RATE: Sed Rate: 13 mm/hr (ref 0–22)

## 2013-09-10 LAB — LDL CHOLESTEROL, DIRECT: LDL DIRECT: 209 mg/dL

## 2013-09-10 LAB — C-REACTIVE PROTEIN: CRP: 0.6 mg/dL (ref 0.5–20.0)

## 2013-09-10 MED ORDER — TETANUS-DIPHTH-ACELL PERTUSSIS 5-2.5-18.5 LF-MCG/0.5 IM SUSP: 0.5000 mL | Freq: Once | INTRAMUSCULAR | Status: DC

## 2013-09-10 NOTE — Progress Notes (Signed)
Pre-visit discussion using our clinic review tool. No additional management support is needed unless otherwise documented below in the visit note.  

## 2013-09-10 NOTE — Progress Notes (Signed)
Patient ID: Kathryn Wise, female   DOB: [birth year], 80 y.o.   MRN: [id number]  The patient is here for annual Medicare wellness examination and management of other chronic and acute problems.    She was Last seen  May 2014. During that time she was having persistent diffuse body aches and because of an elevated sedimentation rate which I tried to explain was physiologically appropriate for her age, I referred her treated for polymyalgia rheumatica by rheumatology with daily moderate dose steroids. Since then Dr. Charlestine Night has decided that she does not have PMR but he has been unable to convince her to stop the steroids. She states that she has been able to wean herself down to 2.5 mg daily but beyond that cannot tolerate the pain that recurs. She has multiple joint complaints including right shoulder pain. She has not resumed golfing yet but wants to just related. She has been referred to Dr. Estanislado Pandy to "figure out what is going on"   Still having left shoulder pian radiating into neck so MRI of cervical spine and rigth shoulder has been ordered due to history of spinal stenosis in the lumbar spine. [name]  Has been going to water aerobics several times per week and alternates with using bands  Given to her by Dr. Mardelle Matte (the surgeon who did her left shoulder replacement) .  States that some days her legs don't feel like they belong to her , the toes will cramp up and the legs cramp up at night .  2)Hoarseness of voice has been on and off for the past 6 to 7 months.  she is reporting persistent tenderness in the right neck underneath her mandible     Mouth is dry in the morning.  Has "awful" indigestion brought on by certain foods which she cannot name. She reports recurrent intermittent  dsyphagia with solid foods,  Has to regurgitate occasionally.     The risk factors are reflected in the social history.  The roster of all physicians providing medical care to patient - is listed in the Snapshot section of  the chart.  Activities of daily living:  The patient is 100% independent in all ADLs: dressing, toileting, feeding as well as independent mobility  Home safety : The patient has smoke detectors in the home. They wear seatbelts.  There are no firearms at home. There is no violence in the home.   There is no risks for hepatitis, STDs or HIV. There is no   history of blood transfusion. They have no travel history to infectious disease endemic areas of the world.  The patient has seen their dentist in the last six month. They have seen their eye doctor in the last year. They admit to slight hearing difficulty with regard to whispered voices and some television programs.  They have deferred audiologic testing in the last year.  They do not  have excessive sun exposure. Discussed the need for sun protection: hats, long sleeves and use of sunscreen if there is significant sun exposure.   Diet: the importance of a healthy diet is discussed. They do have a healthy diet.  The benefits of regular aerobic exercise were discussed. She goes to water aerobics 5 days per week  But wants to start golfing once her shoulder and spine are imagined with MRI.   Depression screen: there are no signs or vegative symptoms of depression- irritability, change in appetite, anhedonia, sadness/tearfullness.  Cognitive assessment: the patient manages all their financial and personal  affairs and is actively engaged. They could relate day,date,year and events; recalled 2/3 objects at 3 minutes; performed clock-face test normally.  The following portions of the patient's history were reviewed and updated as appropriate: allergies, current medications, past family history, past medical history,  past surgical history, past social history  and problem list.  Visual acuity was not assessed per patient preference since she has regular follow up with her ophthalmologist. Hearing and body mass index were assessed and reviewed.    During the course of the visit the patient was educated and counseled about appropriate screening and preventive services including : fall prevention , diabetes screening, nutrition counseling, colorectal cancer screening, and recommended immunizations.    Objective:  BP 142/78  Pulse 73  Temp(Src) 98.2 F (36.8 C) (Oral)  Resp 16  Ht 5\' 6"  (1.676 m)  Wt 154 lb 12 oz (70.194 kg)  BMI 24.99 kg/m2  SpO2 99%  General Appearance:    Alert, cooperative, no distress, appears stated age  Head:    Normocephalic, without obvious abnormality, atraumatic  Eyes:    PERRL, conjunctiva/corneas clear, EOM's intact, fundi    benign, both eyes  Ears:    Normal TM's and external ear canals, both ears  Nose:   Nares normal, septum midline, mucosa normal, no drainage    or sinus tenderness  Throat:   Lips, mucosa, and tongue normal; teeth and gums normal  Neck:   Supple, symmetrical, trachea midline, no adenopathy;    thyroid:  no enlargement/tenderness/nodules; no carotid   bruit or JVD  Back:     Symmetric, no curvature, ROM normal, no CVA tenderness  Lungs:     Clear to auscultation bilaterally, respirations unlabored  Chest Wall:    No tenderness or deformity   Heart:    Regular rate and rhythm, S1 and S2 normal, no murmur, rub   or gallop  Breast Exam:    No tenderness, masses, or nipple abnormality  Abdomen:     Soft, non-tender, bowel sounds active all four quadrants,    no masses, no organomegaly     Extremities:   Extremities normal, atraumatic, no cyanosis or edema  Pulses:   2+ and symmetric all extremities  Skin:   Skin color, texture, turgor normal, no rashes or lesions  Lymph nodes:   Cervical, supraclavicular, and axillary nodes normal  Neurologic:   CNII-XII intact, normal strength, sensation and reflexes    throughout    Assessment and Plan:  Dysphagia, pharyngoesophageal phase Likely secondary to reflux. We'll start daily PPI and order a barium esophageal study to rule  out stricture. Depending on the results she may need EENT versus GI evaluation.  GERD (gastroesophageal reflux disease) Suggested by history. Empiric omeprazole prescribed.  Encounter for preventive health examination Annual comprehensive exam was done including breast, excluding pelvic . All screenings have been addressed .   Polyarthralgia She has been unable to wean herself off of 2.5 mg daily which dermatology has continued to refill. She needs to have a bone density test. Repeat inflammatory markers ordered today in anticipation of her upcoming appointment with Dr. Estanislado Pandy   Updated Medication List Outpatient Encounter Prescriptions as of 09/10/2013  Medication Sig  . Bioflavonoid Products (BIOFLEX PO) Take 1 tablet by mouth daily.  . Calcium-Vitamin D-Vitamin K (VIACTIV PO) Take 1 tablet by mouth daily.  . Cyanocobalamin (VITAMIN B 12 PO) Take 1 tablet by mouth daily.  . Diclofenac-Misoprostol (ARTHROTEC) 75-0.2 MG TBEC Take 1 tablet by mouth daily.  [name]  levothyroxine (SYNTHROID, LEVOTHROID) 88 MCG tablet Take 1 tablet (88 mcg total) by mouth every morning.  . predniSONE (DELTASONE) 10 MG tablet Take 2.5 mg by mouth daily. Patient taper pack 12 days  . GLUCOSAMINE CHONDROITIN COMPLX PO Take by mouth 2 (two) times daily.  [name] HYDROcodone-acetaminophen (NORCO) 10-325 MG per tablet Take 1 tablet by mouth every 8 (eight) hours as needed for pain.  [name] ibuprofen (ADVIL,MOTRIN) 200 MG tablet Take 600 mg by mouth every 4 (four) hours as needed for pain.  [name] lisinopril (PRINIVIL,ZESTRIL) 5 MG tablet Take 5 mg by mouth daily.  . Multiple Vitamin (MULTIVITAMIN WITH MINERALS) TABS Take 1 tablet by mouth daily.  [name] omeprazole (PRILOSEC) 20 MG capsule Take 1 capsule (20 mg total) by mouth daily. In the morning prior to eating  . ondansetron (ZOFRAN) 4 MG tablet Take 1 tablet (4 mg total) by mouth every 8 (eight) hours as needed for nausea.  [name] POTASSIUM PO Take 1 tablet by mouth daily.  . Tdap  (BOOSTRIX) 5-2.5-18.5 LF-MCG/0.5 injection Inject 0.5 mLs into the muscle once.  . traMADol (ULTRAM) 50 MG tablet Take 50 mg by mouth every 6 (six) hours as needed. For pain

## 2013-09-10 NOTE — Patient Instructions (Signed)
You had your annual Medicare wellness exam today  We will schedule your mammogram soon.  Please use the stool kit to send [place] back a sample to test for blood.  This is your colon CA screening test.   You need to have a TDaP vaccine   I have given you prescriptions for this because it will cost loess at the health Dept or at your  local pharmacy because Medicare will not reimburse for it  You received the pneumonia vaccine today.  We will contact you with the bloodwork results    Dysphagia Swallowing problems (dysphagia) occur when solids and liquids seem to stick in your throat on the way down to your stomach, or the food takes longer to get to the stomach. Other symptoms include regurgitating food, noises coming from the throat, chest discomfort with swallowing, and a feeling of fullness or the feeling of something being stuck in your throat when swallowing. When blockage in your throat is complete it may be associated with drooling. CAUSES  Problems with swallowing may occur because of problems with the muscles. The food cannot be propelled in the usual manner into your stomach. You may have ulcers, scar tissue, or inflammation in the tube down which food travels from your mouth to your stomach (esophagus), which blocks food from passing normally into the stomach. Causes of inflammation include:  Acid reflux from your stomach into your esophagus.  Infection.  Radiation treatment for cancer.  Medicines taken without enough fluids to wash them down into your stomach. You may have nerve problems that prevent signals from being sent to the muscles of your esophagus to contract and move your food down to your stomach. Globus pharyngeus is a relatively common problem in which there is a sense of an obstruction or difficulty in swallowing, without any physical abnormalities of the swallowing passages being found. This problem usually improves over time with reassurance and testing to rule out  other causes. DIAGNOSIS Dysphagia can be diagnosed and its cause can be determined by tests in which you swallow a white substance that helps illuminate the inside of your throat (contrast medium) while X-rays are taken. Sometimes a flexible telescope that is inserted down your throat (endoscopy) to look at your esophagus and stomach is used. TREATMENT   If the dysphagia is caused by acid reflux or infection, medicines may be used.  If the dysphagia is caused by problems with your swallowing muscles, swallowing therapy may be used to help you strengthen your swallowing muscles.  If the dysphagia is caused by a blockage or mass, procedures to remove the blockage may be done. HOME CARE INSTRUCTIONS  Try to eat soft food that is easier to swallow and check your weight on a daily basis to be sure that it is not decreasing.  Be sure to drink liquids when sitting upright (not lying down). SEEK MEDICAL CARE IF:  You are losing weight because you are unable to swallow.  You are coughing when you drink liquids (aspiration).  You are coughing up partially digested food. SEEK IMMEDIATE MEDICAL CARE IF:  You are unable to swallow your own saliva .  You are having shortness of breath or a fever, or both.  You have a hoarse voice along with difficulty swallowing. MAKE SURE YOU:  Understand these instructions.  Will watch your condition.  Will get help right away if you are not doing well or get worse. Document Released: 07/13/2000 Document Revised: 03/18/2013 Document Reviewed: 01/02/2013 Exit[employer] Patient Information  2014 [place], [place].

## 2013-09-11 DIAGNOSIS — Z0000 Encounter for general adult medical examination without abnormal findings: Secondary | ICD-10-CM | POA: Insufficient documentation

## 2013-09-11 DIAGNOSIS — K219 Gastro-esophageal reflux disease without esophagitis: Secondary | ICD-10-CM | POA: Insufficient documentation

## 2013-09-11 LAB — VITAMIN D 25 HYDROXY (VIT D DEFICIENCY, FRACTURES): Vit D, 25-Hydroxy: 48 ng/mL (ref 30–89)

## 2013-09-11 LAB — LYME ABY, WSTRN BLT IGG & IGM W/BANDS
B BURGDORFERI IGG ABS (IB): NEGATIVE
B BURGDORFERI IGM ABS (IB): NEGATIVE
LYME DISEASE 18 KD IGG: NONREACTIVE
LYME DISEASE 23 KD IGG: NONREACTIVE
LYME DISEASE 23 KD IGM: NONREACTIVE
LYME DISEASE 28 KD IGG: NONREACTIVE
LYME DISEASE 39 KD IGM: NONREACTIVE
LYME DISEASE 41 KD IGG: REACTIVE — AB
LYME DISEASE 41 KD IGM: NONREACTIVE
LYME DISEASE 66 KD IGG: NONREACTIVE
LYME DISEASE 93 KD IGG: NONREACTIVE
Lyme Disease 30 kD IgG: NONREACTIVE
Lyme Disease 39 kD IgG: NONREACTIVE
Lyme Disease 45 kD IgG: NONREACTIVE
Lyme Disease 58 kD IgG: NONREACTIVE

## 2013-09-11 MED ORDER — OMEPRAZOLE 20 MG PO CPDR: 20.0000 mg | DELAYED_RELEASE_CAPSULE | Freq: Every day | ORAL | Status: DC

## 2013-09-11 NOTE — Assessment & Plan Note (Signed)
Annual comprehensive exam was done including breast, excluding pelvic . All screenings have been addressed .

## 2013-09-11 NOTE — Assessment & Plan Note (Signed)
She has been unable to wean herself off of 2.5 mg daily which dermatology has continued to refill. She needs to have a bone density test. Repeat inflammatory markers ordered today in anticipation of her upcoming appointment with Dr. Estanislado Pandy

## 2013-09-11 NOTE — Assessment & Plan Note (Signed)
Likely secondary to reflux. We'll start daily PPI and order a barium esophageal study to rule out stricture. Depending on the results she may need EENT versus GI evaluation.

## 2013-09-11 NOTE — Assessment & Plan Note (Signed)
Suggested by history. Empiric omeprazole prescribed.

## 2013-09-17 ENCOUNTER — Telehealth: Payer: Self-pay | Admitting: Internal Medicine

## 2013-09-17 NOTE — Telephone Encounter (Signed)
Pt left vm.  Wants [place] to rs her mammogram appt.  She could not go due to weather.  Would like a call with new appt date/time.

## 2013-09-17 NOTE — Telephone Encounter (Signed)
Done

## 2013-09-28 DIAGNOSIS — M25519 Pain in unspecified shoulder: Secondary | ICD-10-CM | POA: Diagnosis not present

## 2013-09-28 DIAGNOSIS — M542 Cervicalgia: Secondary | ICD-10-CM | POA: Diagnosis not present

## 2013-10-02 ENCOUNTER — Ambulatory Visit: Payer: Self-pay | Admitting: Internal Medicine

## 2013-10-02 DIAGNOSIS — Z1231 Encounter for screening mammogram for malignant neoplasm of breast: Secondary | ICD-10-CM | POA: Diagnosis not present

## 2013-10-05 ENCOUNTER — Ambulatory Visit: Payer: Self-pay | Admitting: Internal Medicine

## 2013-10-05 DIAGNOSIS — R1310 Dysphagia, unspecified: Secondary | ICD-10-CM | POA: Diagnosis not present

## 2013-10-05 DIAGNOSIS — K219 Gastro-esophageal reflux disease without esophagitis: Secondary | ICD-10-CM | POA: Diagnosis not present

## 2013-10-05 DIAGNOSIS — K449 Diaphragmatic hernia without obstruction or gangrene: Secondary | ICD-10-CM | POA: Diagnosis not present

## 2013-10-06 ENCOUNTER — Ambulatory Visit: Payer: Self-pay | Admitting: Internal Medicine

## 2013-10-07 ENCOUNTER — Telehealth: Payer: Self-pay | Admitting: Internal Medicine

## 2013-10-07 DIAGNOSIS — R1314 Dysphagia, pharyngoesophageal phase: Secondary | ICD-10-CM

## 2013-10-07 NOTE — Assessment & Plan Note (Signed)
Upper GI study with barium swallow was done at  [facility] Mar 2015   No reflux seen  Small irreducible hiatal hernia  Mild changes of presbyeophagus (abnormal contractions of the esophagus that occur with aging) No strictures Normal gastric emptying  Incomplete visualization of stomach fold due to patient's inability turn

## 2013-10-09 NOTE — Telephone Encounter (Signed)
Mailed unread message to pt  

## 2013-10-21 ENCOUNTER — Encounter: Payer: Self-pay | Admitting: Internal Medicine

## 2013-10-21 ENCOUNTER — Ambulatory Visit: Payer: Self-pay | Admitting: Internal Medicine

## 2013-10-21 DIAGNOSIS — IMO0002 Reserved for concepts with insufficient information to code with codable children: Secondary | ICD-10-CM | POA: Diagnosis not present

## 2013-10-21 DIAGNOSIS — M899 Disorder of bone, unspecified: Secondary | ICD-10-CM | POA: Diagnosis not present

## 2013-10-21 DIAGNOSIS — M129 Arthropathy, unspecified: Secondary | ICD-10-CM | POA: Diagnosis not present

## 2013-10-21 LAB — HM DEXA SCAN

## 2013-10-22 ENCOUNTER — Ambulatory Visit (INDEPENDENT_AMBULATORY_CARE_PROVIDER_SITE_OTHER): Payer: Medicare Other | Admitting: *Deleted

## 2013-10-22 ENCOUNTER — Other Ambulatory Visit: Payer: Self-pay | Admitting: *Deleted

## 2013-10-22 DIAGNOSIS — Z1211 Encounter for screening for malignant neoplasm of colon: Secondary | ICD-10-CM | POA: Diagnosis not present

## 2013-10-22 DIAGNOSIS — M8580 Other specified disorders of bone density and structure, unspecified site: Secondary | ICD-10-CM

## 2013-10-22 LAB — FECAL OCCULT BLOOD, IMMUNOCHEMICAL: Fecal Occult Bld: NEGATIVE

## 2013-10-25 ENCOUNTER — Encounter: Payer: Self-pay | Admitting: Internal Medicine

## 2013-10-25 DIAGNOSIS — M899 Disorder of bone, unspecified: Secondary | ICD-10-CM | POA: Insufficient documentation

## 2013-10-25 DIAGNOSIS — M8580 Other specified disorders of bone density and structure, unspecified site: Secondary | ICD-10-CM | POA: Insufficient documentation

## 2013-10-26 ENCOUNTER — Telehealth: Payer: Self-pay | Admitting: *Deleted

## 2013-10-26 DIAGNOSIS — M2550 Pain in unspecified joint: Secondary | ICD-10-CM

## 2013-10-26 NOTE — Telephone Encounter (Signed)
Left message for patient to return call to office. 

## 2013-10-26 NOTE — Telephone Encounter (Signed)
Referral to [facility] for what purpose? Isn't she already seeing her ?

## 2013-10-26 NOTE — Telephone Encounter (Signed)
Patient is wanting a referral to Dr. Estanislado Pandy for Rheumatology stated you had recommended this and patient wanted to know if you needed to see her back for discussion on bone density patient has no future appointment. Patient also stated she needs refill on prednisone please advise.

## 2013-10-26 NOTE — Telephone Encounter (Signed)
Patient left voicemail concerned about lab results fecal normal bone density in question. Returned call to patient left voicemail for patient to return call to office.

## 2013-10-26 NOTE — Telephone Encounter (Signed)
Patient stated her other Rheumatologist  Quit seeing her and she needs a new one. I 'm sorry patient stated she had discussed this with you.

## 2013-10-27 NOTE — Telephone Encounter (Signed)
Unread mychart message mailed to patient 

## 2013-10-27 NOTE — Telephone Encounter (Signed)
Patient notified

## 2013-10-27 NOTE — Telephone Encounter (Signed)
Referral is in process as requested 

## 2013-11-19 ENCOUNTER — Encounter: Payer: Self-pay | Admitting: Internal Medicine

## 2013-12-02 ENCOUNTER — Other Ambulatory Visit: Payer: Self-pay | Admitting: Internal Medicine

## 2013-12-28 DIAGNOSIS — I679 Cerebrovascular disease, unspecified: Secondary | ICD-10-CM | POA: Diagnosis not present

## 2013-12-28 DIAGNOSIS — L578 Other skin changes due to chronic exposure to nonionizing radiation: Secondary | ICD-10-CM | POA: Diagnosis not present

## 2013-12-28 DIAGNOSIS — L819 Disorder of pigmentation, unspecified: Secondary | ICD-10-CM | POA: Diagnosis not present

## 2013-12-28 DIAGNOSIS — D692 Other nonthrombocytopenic purpura: Secondary | ICD-10-CM | POA: Diagnosis not present

## 2013-12-28 DIAGNOSIS — L820 Inflamed seborrheic keratosis: Secondary | ICD-10-CM | POA: Diagnosis not present

## 2013-12-28 DIAGNOSIS — L905 Scar conditions and fibrosis of skin: Secondary | ICD-10-CM | POA: Diagnosis not present

## 2014-01-13 ENCOUNTER — Encounter: Payer: Self-pay | Admitting: Internal Medicine

## 2014-01-13 ENCOUNTER — Ambulatory Visit (INDEPENDENT_AMBULATORY_CARE_PROVIDER_SITE_OTHER): Payer: Medicare Other | Admitting: Internal Medicine

## 2014-01-13 VITALS — BP 174/70 | HR 74 | Temp 98.4°F | Resp 16 | Ht 66.0 in | Wt 159.5 lb

## 2014-01-13 DIAGNOSIS — R5381 Other malaise: Secondary | ICD-10-CM | POA: Diagnosis not present

## 2014-01-13 DIAGNOSIS — R300 Dysuria: Secondary | ICD-10-CM | POA: Diagnosis not present

## 2014-01-13 DIAGNOSIS — R030 Elevated blood-pressure reading, without diagnosis of hypertension: Secondary | ICD-10-CM | POA: Diagnosis not present

## 2014-01-13 DIAGNOSIS — I10 Essential (primary) hypertension: Secondary | ICD-10-CM | POA: Insufficient documentation

## 2014-01-13 DIAGNOSIS — IMO0001 Reserved for inherently not codable concepts without codable children: Secondary | ICD-10-CM | POA: Diagnosis not present

## 2014-01-13 DIAGNOSIS — R5383 Other fatigue: Secondary | ICD-10-CM | POA: Diagnosis not present

## 2014-01-13 DIAGNOSIS — Z79899 Other long term (current) drug therapy: Secondary | ICD-10-CM | POA: Diagnosis not present

## 2014-01-13 DIAGNOSIS — M19049 Primary osteoarthritis, unspecified hand: Secondary | ICD-10-CM | POA: Diagnosis not present

## 2014-01-13 DIAGNOSIS — M899 Disorder of bone, unspecified: Secondary | ICD-10-CM | POA: Diagnosis not present

## 2014-01-13 DIAGNOSIS — E559 Vitamin D deficiency, unspecified: Secondary | ICD-10-CM | POA: Diagnosis not present

## 2014-01-13 DIAGNOSIS — M13849 Other specified arthritis, unspecified hand: Secondary | ICD-10-CM | POA: Diagnosis not present

## 2014-01-13 DIAGNOSIS — I150 Renovascular hypertension: Secondary | ICD-10-CM | POA: Insufficient documentation

## 2014-01-13 DIAGNOSIS — M949 Disorder of cartilage, unspecified: Secondary | ICD-10-CM | POA: Diagnosis not present

## 2014-01-13 DIAGNOSIS — M2550 Pain in unspecified joint: Secondary | ICD-10-CM | POA: Diagnosis not present

## 2014-01-13 DIAGNOSIS — M5137 Other intervertebral disc degeneration, lumbosacral region: Secondary | ICD-10-CM | POA: Diagnosis not present

## 2014-01-13 NOTE — Progress Notes (Signed)
Pre-visit discussion using our clinic review tool. No additional management support is needed unless otherwise documented below in the visit note.  

## 2014-01-13 NOTE — Progress Notes (Signed)
Patient ID: Kathryn Wise, female   DOB: 02-22-1930, 80 y.o.   MRN: [id number]   Patient Active Problem List   Diagnosis Date Noted  . White coat syndrome without diagnosis of hypertension 01/13/2014  . Osteopenia 10/25/2013  . GERD (gastroesophageal reflux disease) 09/11/2013  . Encounter for preventive health examination 09/11/2013  . Dysphagia, pharyngoesophageal phase 09/10/2013  . Heart murmur 11/28/2012  . History of shingles   . Polyarthralgia 11/18/2012  . Pain 11/11/2012  . Edema 11/08/2012  . Postoperative anemia due to acute blood loss 11/08/2012  . Osteoarthritis of left shoulder 07/14/2012    Subjective:  CC:   Chief Complaint  Patient presents with  . Hypertension    elevated Bp    HPI:   Kathryn Wise is a 80 y.o. female who presents for Blood pressure problems.  She has no history of hypertension but was noted to have a systolic BP of 591 today during an evaluation by Dr Estanislado Pandy  For persistent severe joint pain .  She has a  home BP kit which uses a wrist cuff and has been checking it with home bps which have been averaging 140/82.  She is taking low dose prednisone because after tapering it slowly to off , her joint pain became severe.  Her joint pain is bothersome and continual .  The evaluation by Dr Estanislado Pandy is reportedly leading to a diagnosis of PMR  pending review of multiple labs.    Past Medical History  Diagnosis Date  . Hypothyroidism   . Heart murmur     has had years and years  . Arthritis   . No pertinent past medical history     HX BRONCHITIS   . Osteoarthritis of left shoulder 07/14/2012  . History of shingles Dec 2013    treated with steroids , post op from shoulder surgery    Past Surgical History  Procedure Laterality Date  . Foot surgery      rt foot   TUMOR REMOVED  . Arthoscopic rotaor cuff repair      LEFT  10+  YEARS    . Cataract extraction w/ intraocular lens implant      RIGHT EYE  . Skin cancer excision    .  Facelift    . Total shoulder arthroplasty  07/14/2012    Procedure: TOTAL SHOULDER ARTHROPLASTY;  Surgeon: Johnny Bridge, MD;  Location: [facility];  Service: Orthopedics;  Laterality: Left;  . Joint replacement Left Dec 2013    shoulder       The following portions of the patient's history were reviewed and updated as appropriate: Allergies, current medications, and problem list.    Review of Systems:   Patient denies headache, fevers, malaise, unintentional weight loss, skin rash, eye pain, sinus congestion and sinus pain, sore throat, dysphagia,  hemoptysis , cough, dyspnea, wheezing, chest pain, palpitations, orthopnea, edema, abdominal pain, nausea, melena, diarrhea, constipation, flank pain, dysuria, hematuria, urinary  Frequency, nocturia, numbness, tingling, seizures,  Focal weakness, Loss of consciousness,  Tremor, insomnia, depression, anxiety, and suicidal ideation.     History   Social History  . Marital Status: Widowed    Spouse Name: N/A    Number of Children: N/A  . Years of Education: N/A   Occupational History  . Not on file.   Social History Main Topics  . Smoking status: Former Smoker    Types: Cigarettes    Quit date: 07/30/1972  . Smokeless tobacco: Not on file  Comment: smoked for only a few months  . Alcohol Use: Yes     Comment: OCCAS WINE   . Drug Use: No  . Sexual Activity: Not on file   Other Topics Concern  . Not on file   Social History Narrative  . No narrative on file    Objective:  Filed Vitals:   01/13/14 1539  BP: 174/70  Pulse: 74  Temp: 98.4 F (36.9 C)  Resp: 16     General appearance: alert, cooperative and appears stated age Ears: normal TM's and external ear canals both ears Throat: lips, mucosa, and tongue normal; teeth and gums normal Neck: no adenopathy, no carotid bruit, supple, symmetrical, trachea midline and thyroid not enlarged, symmetric, no tenderness/mass/nodules Back: symmetric, no curvature. ROM  normal. No CVA tenderness. Lungs: clear to auscultation bilaterally Heart: regular rate and rhythm, S1, S2 normal, no murmur, click, rub or gallop Abdomen: soft, non-tender; bowel sounds normal; no masses,  no organomegaly Pulses: 2+ and symmetric Skin: Skin color, texture, turgor normal. No rashes or lesions Lymph nodes: Cervical, supraclavicular, and axillary nodes normal.  Assessment and Plan:  White coat syndrome without diagnosis of hypertension I have asked patient to return with her home BP machine for calibration since home measurements  have been 140/80  Polyarthralgia Suspected diagnosis is PMR ,  Will send original labs to Dr. Estanislado Pandy.    Updated Medication List Outpatient Encounter Prescriptions as of 01/13/2014  Medication Sig  . Bioflavonoid Products (BIOFLEX PO) Take 1 tablet by mouth daily.  . Calcium-Vitamin D-Vitamin K (VIACTIV PO) Take 1 tablet by mouth daily.  . Cyanocobalamin (VITAMIN B 12 PO) Take 1 tablet by mouth daily.  . Diclofenac-Misoprostol (ARTHROTEC) 75-0.2 MG TBEC Take 1 tablet by mouth daily.  [name] GLUCOSAMINE CHONDROITIN COMPLX PO Take by mouth 2 (two) times daily.  [name] HYDROcodone-acetaminophen (NORCO) 10-325 MG per tablet Take 1 tablet by mouth every 8 (eight) hours as needed for pain.  [name] ibuprofen (ADVIL,MOTRIN) 200 MG tablet Take 600 mg by mouth every 4 (four) hours as needed for pain.  [name] levothyroxine (SYNTHROID, LEVOTHROID) 88 MCG tablet TAKE 1 TABLET BY MOUTH EACH MORNING FOR THYROID. BEST ON EMPTY STOMACH.  [name] lisinopril (PRINIVIL,ZESTRIL) 5 MG tablet Take 5 mg by mouth daily.  . Multiple Vitamin (MULTIVITAMIN WITH MINERALS) TABS Take 1 tablet by mouth daily.  [name] omeprazole (PRILOSEC) 20 MG capsule Take 1 capsule (20 mg total) by mouth daily. In the morning prior to eating  . ondansetron (ZOFRAN) 4 MG tablet Take 1 tablet (4 mg total) by mouth every 8 (eight) hours as needed for nausea.  [name] POTASSIUM PO Take 1 tablet by mouth daily.  . predniSONE  (DELTASONE) 10 MG tablet Take 2.5 mg by mouth daily. Patient taper pack 12 days  . Tdap (BOOSTRIX) 5-2.5-18.5 LF-MCG/0.5 injection Inject 0.5 mLs into the muscle once.  . traMADol (ULTRAM) 50 MG tablet Take 50 mg by mouth every 6 (six) hours as needed. For pain     No orders of the defined types were placed in this encounter.    Return in about 2 days (around 01/15/2014).

## 2014-01-13 NOTE — Patient Instructions (Addendum)
Your elevated blood pressures may be due to pain, anxiety or "white coat hypertension"  Please return on Thursday afternoon with your home BP machine so we can compare  Your readings with ours  Your leg wound may be irritated by the Neosporin ,  So stop using it.  Keep it covered and clean it with sterile saline   You can take up to 3000 mg of tylenol daily safely  (6  500 mg tablets )   Hypertension Hypertension, commonly called high blood pressure, is when the force of blood pumping through your arteries is too strong. Your arteries are the blood vessels that carry blood from your heart throughout your body. A blood pressure reading consists of a higher number over a lower number, such as 110/72. The higher number (systolic) is the pressure inside your arteries when your heart pumps. The lower number (diastolic) is the pressure inside your arteries when your heart relaxes. Ideally you want your blood pressure below 120/80. Hypertension forces your heart to work harder to pump blood. Your arteries may become narrow or stiff. Having hypertension puts you at risk for heart disease, stroke, and other problems.  RISK FACTORS Some risk factors for high blood pressure are controllable. Others are not.  Risk factors you cannot control include:   Race. You may be at higher risk if you are African American.  Age. Risk increases with age.  Gender. Men are at higher risk than women before age 78 years. After age 26, women are at higher risk than men. Risk factors you can control include:  Not getting enough exercise or physical activity.  Being overweight.  Getting too much fat, sugar, calories, or salt in your diet.  Drinking too much alcohol. SIGNS AND SYMPTOMS Hypertension does not usually cause signs or symptoms. Extremely high blood pressure (hypertensive crisis) may cause headache, anxiety, shortness of breath, and nosebleed. DIAGNOSIS  To check if you have hypertension, your health care  Kathryn Wise will measure your blood pressure while you are seated, with your arm held at the level of your heart. It should be measured at least twice using the same arm. Certain conditions can cause a difference in blood pressure between your right and left arms. A blood pressure reading that is higher than normal on one occasion does not mean that you need treatment. If one blood pressure reading is high, ask your health care Kathryn Wise about having it checked again. TREATMENT  Treating high blood pressure includes making lifestyle changes and possibly taking medication. Living a healthy lifestyle can help lower high blood pressure. You may need to change some of your habits. Lifestyle changes may include:  Following the DASH diet. This diet is high in fruits, vegetables, and whole grains. It is low in salt, red meat, and added sugars.  Getting at least 2 1/2 hours of brisk physical activity every week.  Losing weight if necessary.  Not smoking.  Limiting alcoholic beverages.  Learning ways to reduce stress. If lifestyle changes are not enough to get your blood pressure under control, your health care Kathryn Wise may prescribe medicine. You may need to take more than one. Work closely with your health care Kathryn Wise to understand the risks and benefits. HOME CARE INSTRUCTIONS  Have your blood pressure rechecked as directed by your health care Kathryn Wise.   Only take medicine as directed by your health care Kathryn Wise. Follow the directions carefully. Blood pressure medicines must be taken as prescribed. The medicine does not work as well when  you skip doses. Skipping doses also puts you at risk for problems.   Do not smoke.   Monitor your blood pressure at home as directed by your health care Kathryn Wise. SEEK MEDICAL CARE IF:   You think you are having a reaction to medicines taken.  You have recurrent headaches or feel dizzy.  You have swelling in your ankles.  You have trouble with your  vision. SEEK IMMEDIATE MEDICAL CARE IF:  You develop a severe headache or confusion.  You have unusual weakness, numbness, or feel faint.  You have severe chest or abdominal pain.  You vomit repeatedly.  You have trouble breathing. MAKE SURE YOU:   Understand these instructions.  Will watch your condition.  Will get help right away if you are not doing well or get worse. Document Released: 07/16/2005 Document Revised: 07/21/2013 Document Reviewed: 05/08/2013 [facility] Patient Information 2015 [place], [place]. This information is not intended to replace advice given to you by your health care Kathryn Wise. Make sure you discuss any questions you have with your health care Kathryn Wise.

## 2014-01-14 ENCOUNTER — Ambulatory Visit (INDEPENDENT_AMBULATORY_CARE_PROVIDER_SITE_OTHER): Payer: Medicare Other | Admitting: *Deleted

## 2014-01-14 ENCOUNTER — Encounter: Payer: Self-pay | Admitting: *Deleted

## 2014-01-14 VITALS — BP 148/80

## 2014-01-14 DIAGNOSIS — R030 Elevated blood-pressure reading, without diagnosis of hypertension: Secondary | ICD-10-CM

## 2014-01-14 DIAGNOSIS — IMO0001 Reserved for inherently not codable concepts without codable children: Secondary | ICD-10-CM

## 2014-01-14 NOTE — Progress Notes (Signed)
Pre visit review using our clinic review tool, if applicable. No additional management support is needed unless otherwise documented below in the visit note. 

## 2014-01-14 NOTE — Progress Notes (Signed)
Patient ID: Kathryn Wise, female   DOB: [birth year], 80 y.o.   MRN: [id number]

## 2014-01-16 ENCOUNTER — Encounter: Payer: Self-pay | Admitting: Internal Medicine

## 2014-01-16 NOTE — Assessment & Plan Note (Signed)
Suspected diagnosis is PMR ,  Will send original labs to Dr. Estanislado Pandy.

## 2014-01-16 NOTE — Assessment & Plan Note (Signed)
I have asked patient to return with her home BP machine for calibration since home measurements  have been 140/80

## 2014-01-18 ENCOUNTER — Telehealth: Payer: Self-pay | Admitting: Internal Medicine

## 2014-01-18 NOTE — Telephone Encounter (Signed)
Faxed records as requested,

## 2014-01-18 NOTE — Telephone Encounter (Signed)
Received labs drom Dr Arlean Hopping office.  She asymptomatic bacteria in her urine.  This is not treated, as there are no signs of infection or inflammation.   Her other labs were normal ,  Please send Dr Estanislado Pandy the labs and office note I printed out per her request.  She is at [employer]

## 2014-01-21 ENCOUNTER — Telehealth: Payer: Self-pay | Admitting: *Deleted

## 2014-01-21 ENCOUNTER — Other Ambulatory Visit: Payer: Self-pay | Admitting: Internal Medicine

## 2014-01-21 MED ORDER — LISINOPRIL 10 MG PO TABS: 10.0000 mg | ORAL_TABLET | Freq: Every day | ORAL | Status: DC

## 2014-01-21 NOTE — Telephone Encounter (Signed)
Left message on VM Rx sent

## 2014-01-21 NOTE — Telephone Encounter (Signed)
Pt called states she took her BP this morning and it was 172/80.  She is requesting Lisinopril 5mg  be refilled.  Please advise

## 2014-01-21 NOTE — Telephone Encounter (Signed)
I am increasing it to 10 mg daily  Based on her BP readings.  rx sent to [facility]

## 2014-02-03 DIAGNOSIS — R300 Dysuria: Secondary | ICD-10-CM | POA: Diagnosis not present

## 2014-02-03 DIAGNOSIS — M899 Disorder of bone, unspecified: Secondary | ICD-10-CM | POA: Diagnosis not present

## 2014-02-03 DIAGNOSIS — M5137 Other intervertebral disc degeneration, lumbosacral region: Secondary | ICD-10-CM | POA: Diagnosis not present

## 2014-02-03 DIAGNOSIS — M949 Disorder of cartilage, unspecified: Secondary | ICD-10-CM | POA: Diagnosis not present

## 2014-02-03 DIAGNOSIS — IMO0001 Reserved for inherently not codable concepts without codable children: Secondary | ICD-10-CM | POA: Diagnosis not present

## 2014-02-03 DIAGNOSIS — M19049 Primary osteoarthritis, unspecified hand: Secondary | ICD-10-CM | POA: Diagnosis not present

## 2014-02-23 DIAGNOSIS — N390 Urinary tract infection, site not specified: Secondary | ICD-10-CM | POA: Diagnosis not present

## 2014-04-23 ENCOUNTER — Other Ambulatory Visit: Payer: Self-pay | Admitting: Internal Medicine

## 2014-04-29 IMAGING — US TRANSABDOMINAL ULTRASOUND OF PELVIS
1 series · 14 of 25 positions shown · non-contrast
Comparison: none

REASON FOR EXAM: suprapubic  LLQ pain
COMMENTS:

[Series 1: transabdominal ultrasound of pelvis · 0.23mm/px · 14 of 53 slices shown]
[im 1/53]
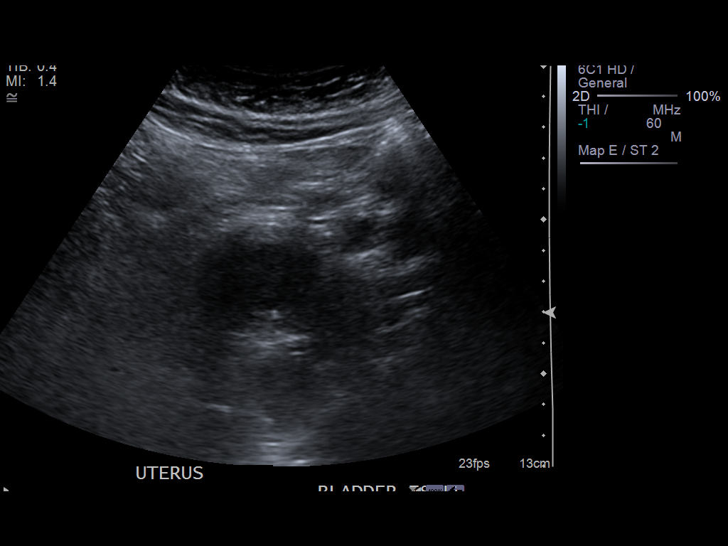
[im 5/53]
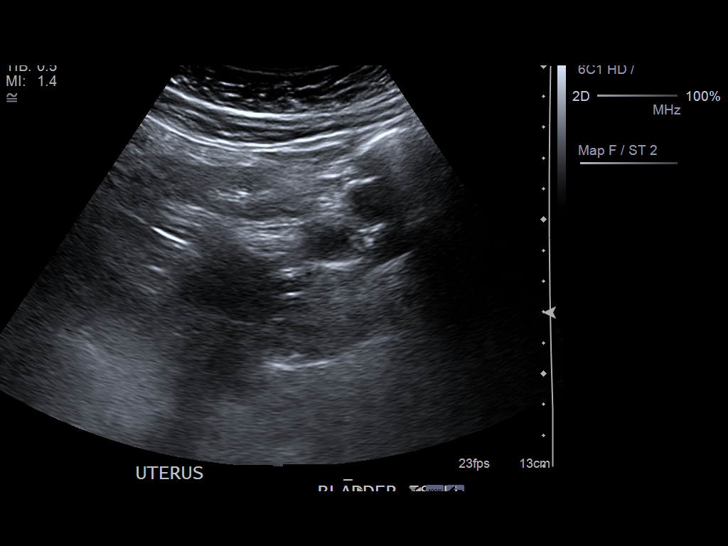
[im 9/53]
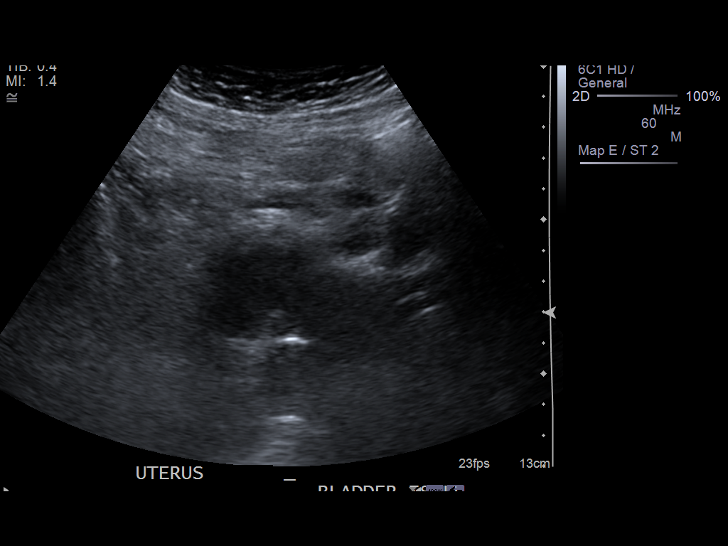
[im 14/53]
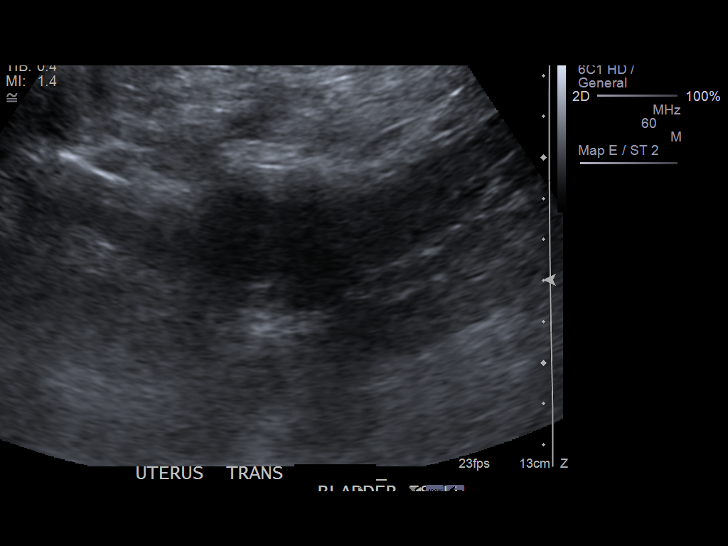
[im 18/53]
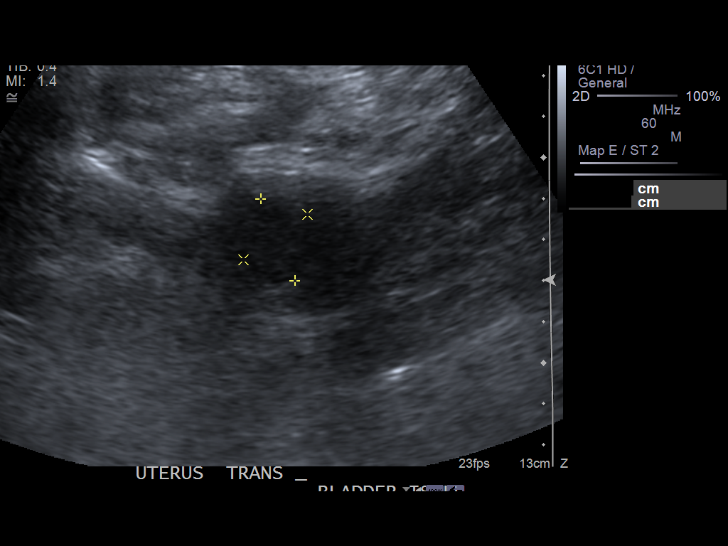
[im 20/53]
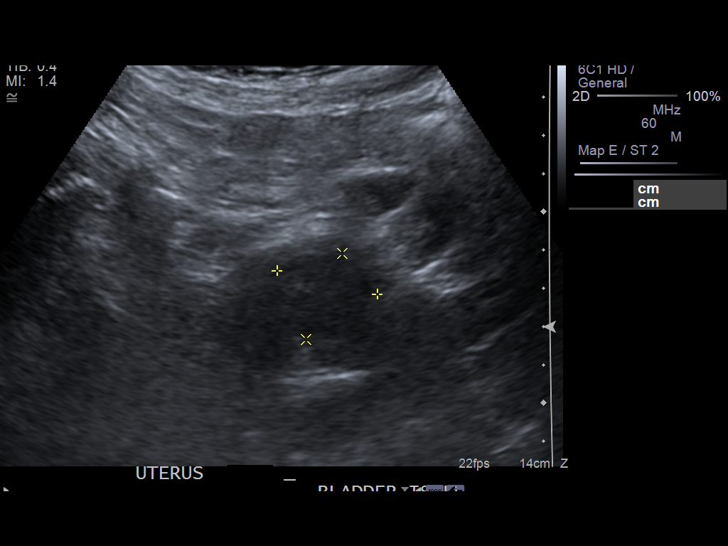
[im 24/53]
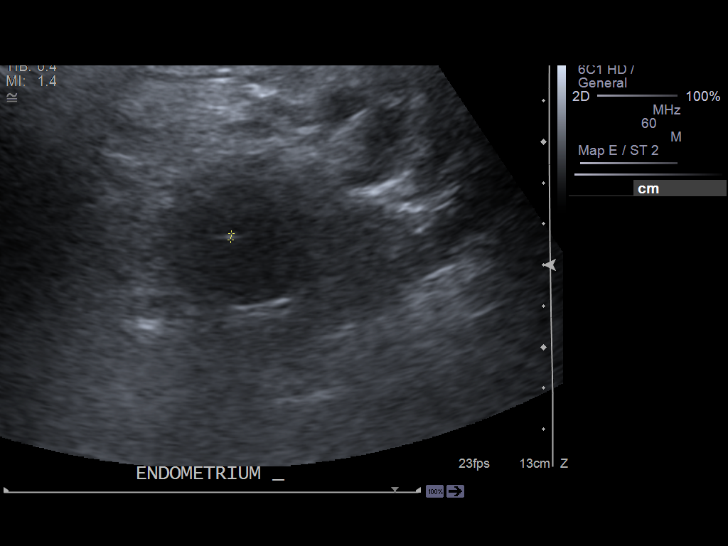
[im 29/53]
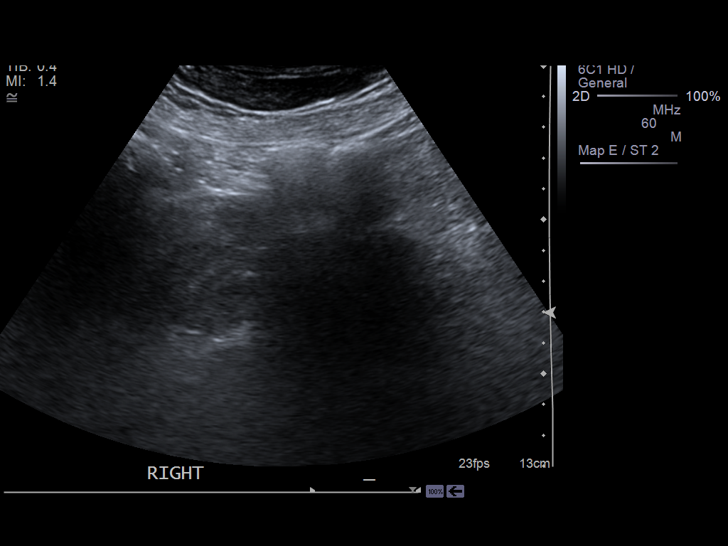
[im 33/53]
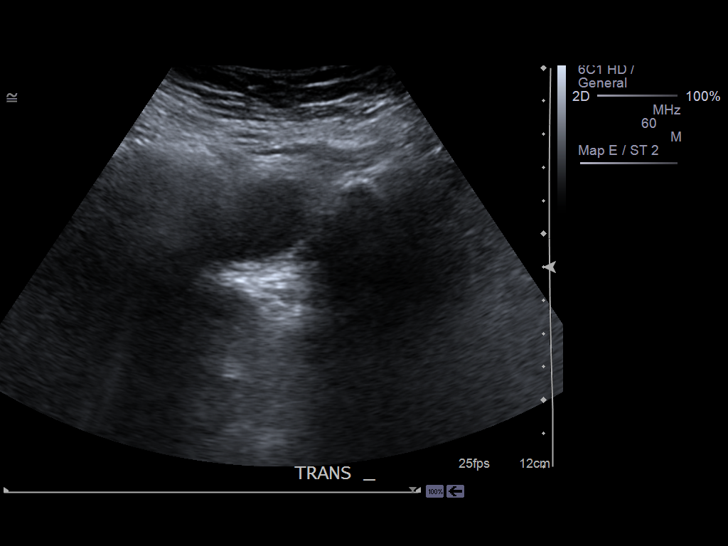
[im 35/53]
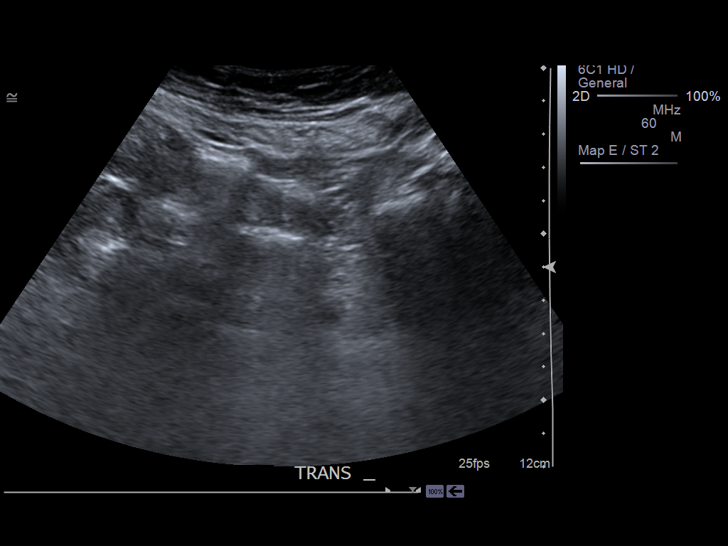
[im 40/53]
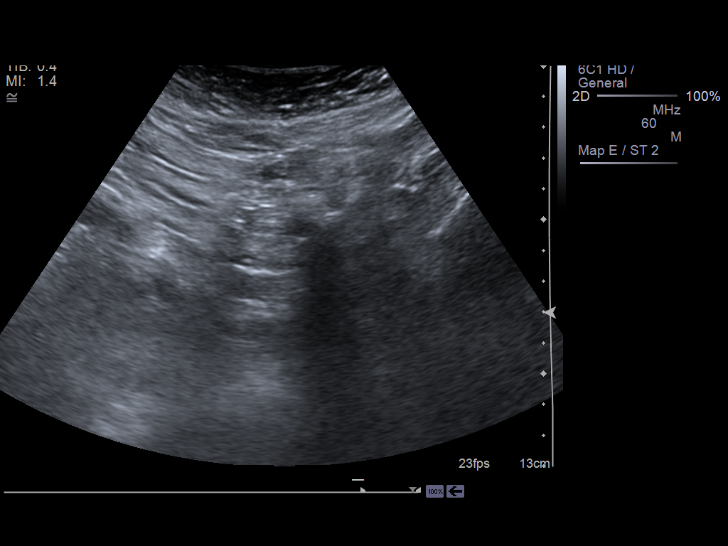
[im 44/53]
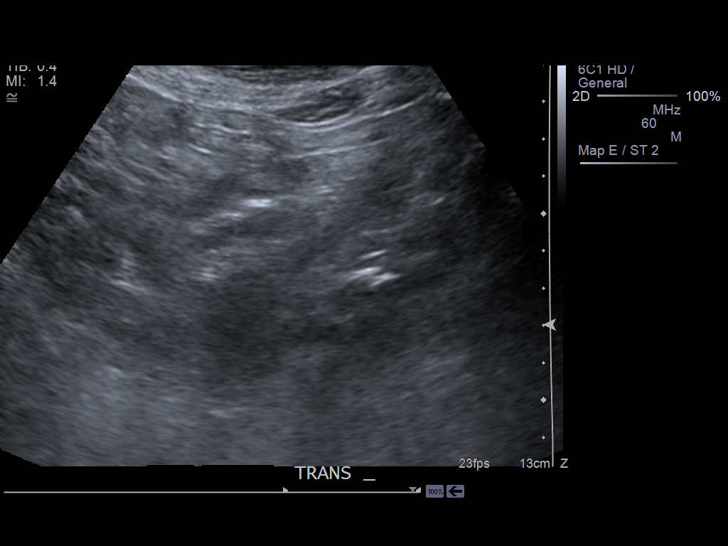
[im 48/53]
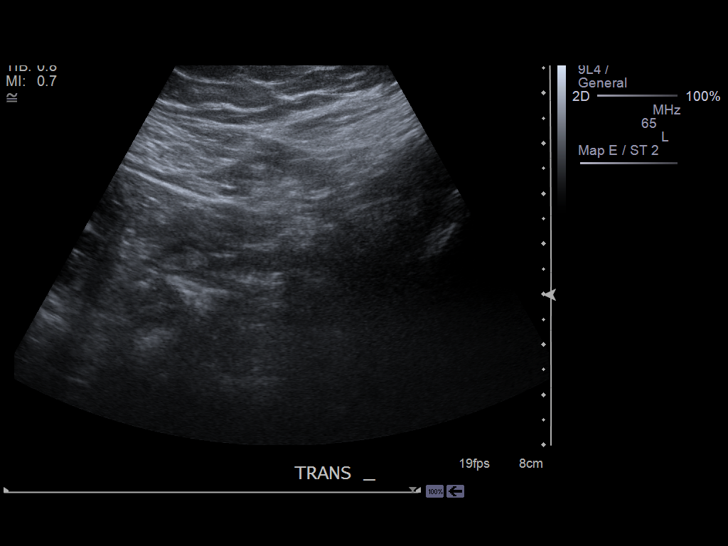
[im 53/53]
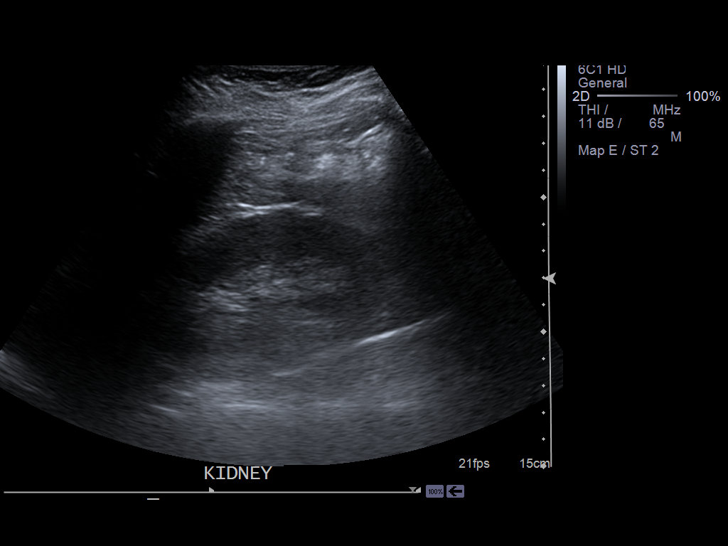

[14 of 25 positions shown; findings below may reference images not displayed]

PROCEDURE:     US  - US PELVIS EXAM  - March 03, 2012 [DATE]

RESULT:     Pelvic Sonogram is performed with transabdominal imaging only.
The uterus measures 6.48 x 4.20 x 3.15 cm. The ovaries could not be
visualized. The endometrial stripe thickness is 1.6 mm. There is no free
fluid or abnormal fluid collection. Within the anterior fundal portion of
the uterus, there is evidence of a heterogeneous echogenic area with some
areas of decreased echogenicity measuring 2.69 x 2.44 x 2.1 cm consistent
with a uterine leiomyoma.
IMPRESSION: 1.  Single uterine fibroid.
2.  The ovaries are not visualized.
3.  The kidneys are grossly normal on survey images. Mild right pelvic
fullness is noted in the renal pelvis.

[REDACTED]

## 2014-05-03 DIAGNOSIS — Z85828 Personal history of other malignant neoplasm of skin: Secondary | ICD-10-CM | POA: Diagnosis not present

## 2014-05-03 DIAGNOSIS — D239 Other benign neoplasm of skin, unspecified: Secondary | ICD-10-CM | POA: Diagnosis not present

## 2014-05-03 DIAGNOSIS — L821 Other seborrheic keratosis: Secondary | ICD-10-CM | POA: Diagnosis not present

## 2014-05-03 DIAGNOSIS — L905 Scar conditions and fibrosis of skin: Secondary | ICD-10-CM | POA: Diagnosis not present

## 2014-05-03 DIAGNOSIS — L820 Inflamed seborrheic keratosis: Secondary | ICD-10-CM | POA: Diagnosis not present

## 2014-05-03 DIAGNOSIS — Z1283 Encounter for screening for malignant neoplasm of skin: Secondary | ICD-10-CM | POA: Diagnosis not present

## 2014-05-03 DIAGNOSIS — I788 Other diseases of capillaries: Secondary | ICD-10-CM | POA: Diagnosis not present

## 2014-05-03 DIAGNOSIS — L570 Actinic keratosis: Secondary | ICD-10-CM | POA: Diagnosis not present

## 2014-06-07 DIAGNOSIS — M533 Sacrococcygeal disorders, not elsewhere classified: Secondary | ICD-10-CM | POA: Diagnosis not present

## 2014-06-07 DIAGNOSIS — M5137 Other intervertebral disc degeneration, lumbosacral region: Secondary | ICD-10-CM | POA: Diagnosis not present

## 2014-06-07 DIAGNOSIS — Z79899 Other long term (current) drug therapy: Secondary | ICD-10-CM | POA: Diagnosis not present

## 2014-06-07 DIAGNOSIS — M2550 Pain in unspecified joint: Secondary | ICD-10-CM | POA: Diagnosis not present

## 2014-06-07 DIAGNOSIS — M19041 Primary osteoarthritis, right hand: Secondary | ICD-10-CM | POA: Diagnosis not present

## 2014-06-07 DIAGNOSIS — M8580 Other specified disorders of bone density and structure, unspecified site: Secondary | ICD-10-CM | POA: Diagnosis not present

## 2014-06-07 LAB — HEPATIC FUNCTION PANEL
ALT: 16 U/L (ref 7–35)
AST: 21 U/L (ref 13–35)
Alkaline Phosphatase: 42 U/L (ref 25–125)
BILIRUBIN, TOTAL: 0.9 mg/dL

## 2014-06-07 LAB — BASIC METABOLIC PANEL
BUN: 20 mg/dL (ref 4–21)
Creatinine: 1.1 mg/dL (ref 0.5–1.1)
GLUCOSE: 86 mg/dL
POTASSIUM: 4.9 mmol/L (ref 3.4–5.3)
Sodium: 138 mmol/L (ref 137–147)

## 2014-06-07 LAB — CBC AND DIFFERENTIAL
HEMATOCRIT: 42 % (ref 36–46)
Hemoglobin: 13.8 g/dL (ref 12.0–16.0)
PLATELETS: 225 10*3/uL (ref 150–399)
WBC: 6.5 10*3/mL

## 2014-06-09 ENCOUNTER — Other Ambulatory Visit: Payer: Self-pay | Admitting: Internal Medicine

## 2014-06-17 ENCOUNTER — Encounter: Payer: Self-pay | Admitting: Internal Medicine

## 2014-07-06 ENCOUNTER — Encounter: Payer: Self-pay | Admitting: Internal Medicine

## 2014-08-10 ENCOUNTER — Other Ambulatory Visit: Payer: Self-pay | Admitting: Internal Medicine

## 2014-10-08 ENCOUNTER — Ambulatory Visit (INDEPENDENT_AMBULATORY_CARE_PROVIDER_SITE_OTHER): Payer: Medicare Other | Admitting: Internal Medicine

## 2014-10-08 ENCOUNTER — Encounter: Payer: Self-pay | Admitting: Internal Medicine

## 2014-10-08 VITALS — BP 148/64 | HR 82 | Temp 98.2°F | Resp 14 | Ht 65.5 in | Wt 155.5 lb

## 2014-10-08 DIAGNOSIS — R011 Cardiac murmur, unspecified: Secondary | ICD-10-CM | POA: Diagnosis not present

## 2014-10-08 DIAGNOSIS — I10 Essential (primary) hypertension: Secondary | ICD-10-CM

## 2014-10-08 DIAGNOSIS — E034 Atrophy of thyroid (acquired): Secondary | ICD-10-CM | POA: Diagnosis not present

## 2014-10-08 DIAGNOSIS — Z23 Encounter for immunization: Secondary | ICD-10-CM | POA: Diagnosis not present

## 2014-10-08 DIAGNOSIS — Z0000 Encounter for general adult medical examination without abnormal findings: Secondary | ICD-10-CM

## 2014-10-08 DIAGNOSIS — M353 Polymyalgia rheumatica: Secondary | ICD-10-CM

## 2014-10-08 DIAGNOSIS — M791 Myalgia, unspecified site: Secondary | ICD-10-CM

## 2014-10-08 DIAGNOSIS — E785 Hyperlipidemia, unspecified: Secondary | ICD-10-CM | POA: Diagnosis not present

## 2014-10-08 DIAGNOSIS — Z1239 Encounter for other screening for malignant neoplasm of breast: Secondary | ICD-10-CM | POA: Diagnosis not present

## 2014-10-08 DIAGNOSIS — M8580 Other specified disorders of bone density and structure, unspecified site: Secondary | ICD-10-CM

## 2014-10-08 DIAGNOSIS — E038 Other specified hypothyroidism: Secondary | ICD-10-CM | POA: Diagnosis not present

## 2014-10-08 LAB — COMPREHENSIVE METABOLIC PANEL
ALK PHOS: 53 U/L (ref 39–117)
ALT: 14 U/L (ref 0–35)
AST: 21 U/L (ref 0–37)
Albumin: 4.1 g/dL (ref 3.5–5.2)
BILIRUBIN TOTAL: 0.5 mg/dL (ref 0.2–1.2)
BUN: 14 mg/dL (ref 6–23)
CO2: 28 mEq/L (ref 19–32)
Calcium: 9.6 mg/dL (ref 8.4–10.5)
Chloride: 102 mEq/L (ref 96–112)
Creat: 0.93 mg/dL (ref 0.50–1.10)
Glucose, Bld: 87 mg/dL (ref 70–99)
POTASSIUM: 4.7 meq/L (ref 3.5–5.3)
Sodium: 140 mEq/L (ref 135–145)
Total Protein: 6.8 g/dL (ref 6.0–8.3)

## 2014-10-08 LAB — LIPID PANEL
Cholesterol: 193 mg/dL (ref 0–200)
HDL: 42 mg/dL — ABNORMAL LOW (ref >=46)
LDL CALC: 109 mg/dL — AB (ref 0–99)
TRIGLYCERIDES: 209 mg/dL — AB (ref <150)
Total CHOL/HDL Ratio: 4.6 Ratio
VLDL: 42 mg/dL — ABNORMAL HIGH (ref 0–40)

## 2014-10-08 LAB — CK: Total CK: 193 U/L — ABNORMAL HIGH (ref 7–177)

## 2014-10-08 LAB — MAGNESIUM: Magnesium: 2 mg/dL (ref 1.5–2.5)

## 2014-10-08 MED ORDER — RALOXIFENE HCL 60 MG PO TABS: 60.0000 mg | ORAL_TABLET | Freq: Every day | ORAL | Status: DC

## 2014-10-08 MED ORDER — LISINOPRIL 10 MG PO TABS: 10.0000 mg | ORAL_TABLET | Freq: Every day | ORAL | Status: DC

## 2014-10-08 NOTE — Progress Notes (Signed)
Patient ID: Kathryn Wise, female   DOB: 1930/06/29, 81 y.o.   MRN: [id number]  The patient is here for annual Medicare wellness examination and management of other chronic and acute problems.    She is requesting a review of Dr Kathryn Wise last evaluation.  Long Discussion about her Migratory joint pain , prior diagnosis of  PMR and OA  As reflected in the progress notes of Dr. Estanislado Wise  .  Has been off of prednisone since Nov 1, after a 4 week taper .  Doesn't want to go back o prednisone .  Used to be on arthrotec , using aleve now since Dr Kathryn Wise has stopped that medication as well.  She has daily joint pain but is still exercising  5 days week using pool .   The risk factors are reflected in the social history.  The roster of all physicians providing medical care to patient - is listed in the Snapshot section of the chart.  Sees Kathryn Wise annually, [facility].   Activities of daily living:  The patient is 100% independent in all ADLs: dressing, toileting, feeding as well as independent mobility  Home safety : The patient has smoke detectors in the home. They wear seatbelts.  There are no firearms at home. There is no violence in the home.   There is no risks for hepatitis, STDs or HIV. There is no   history of blood transfusion. They have no travel history to infectious disease endemic areas of the world.  The patient has seen their dentist in the last six month. They have seen their eye doctor in the last year. They admit to slight hearing difficulty with regard to whispered voices and some television programs.  They have deferred audiologic testing in the last year.  They do not  have excessive sun exposure. Discussed the need for sun protection: hats, long sleeves and use of sunscreen if there is significant sun exposure.   Diet: the importance of a healthy diet is discussed. They do have a healthy diet.  The benefits of regular aerobic exercise were discussed. She walks 4 times  per week ,  20 minutes.   Depression screen: there are no signs or vegative symptoms of depression- irritability, change in appetite, anhedonia, sadness/tearfullness.  Cognitive assessment: the patient manages all their financial and personal affairs and is actively engaged. They could relate day,date,year and events; recalled 2/3 objects at 3 minutes; performed clock-face test normally.  The following portions of the patient's history were reviewed and updated as appropriate: allergies, current medications, past family history, past medical history,  past surgical history, past social history  and problem list.  Visual acuity was not assessed per patient preference since she has regular follow up with her ophthalmologist. Hearing and body mass index were assessed and reviewed.   During the course of the visit the patient was educated and counseled about appropriate screening and preventive services including : fall prevention , diabetes screening, nutrition counseling, colorectal cancer screening, and recommended immunizations.    Review of Systems:  Patient denies headache, fevers, malaise, unintentional weight loss, skin rash, eye pain, sinus congestion and sinus pain, sore throat, dysphagia,  hemoptysis , cough, dyspnea, wheezing, chest pain, palpitations, orthopnea, edema, abdominal pain, nausea, melena, diarrhea, constipation, flank pain, dysuria, hematuria, urinary  Frequency, nocturia, numbness, tingling, seizures,  Focal weakness, Loss of consciousness,  Tremor, insomnia, depression, anxiety, and suicidal ideation.    Objective:  BP 148/64 mmHg  Pulse 82  Temp(Src)  98.2 F (36.8 C) (Oral)  Resp 14  Ht 5' 5.5" (1.664 m)  Wt 155 lb 8 oz (70.534 kg)  BMI 25.47 kg/m2  SpO2 96%  General appearance: alert, cooperative and appears stated age Head: Normocephalic, without obvious abnormality, atraumatic Eyes: conjunctivae/corneas clear. PERRL, EOM's intact. Fundi benign. Ears: normal  TM's and external ear canals both ears Nose: Nares normal. Septum midline. Mucosa normal. No drainage or sinus tenderness. Throat: lips, mucosa, and tongue normal; teeth and gums normal Neck: no adenopathy, no carotid bruit, no JVD, supple, symmetrical, trachea midline and thyroid not enlarged, symmetric, no tenderness/mass/nodules Lungs: clear to auscultation bilaterally Breasts: normal appearance, no masses or tenderness Heart: regular rate and rhythm, S1, S2 normal, no murmur, click, rub or gallop Abdomen: soft, non-tender; bowel sounds normal; no masses,  no organomegaly Extremities: extremities normal, atraumatic, no cyanosis or edema Pulses: 2+ and symmetric Skin: Skin color, texture, turgor normal. No rashes or lesions Neurologic: Alert and oriented X 3, normal strength and tone. Normal symmetric reflexes. Normal coordination and gait.    Assessment and Plan:  Problem List Items Addressed This Visit      Unprioritized   Polymyalgia rheumatica    Diagnosed last year with elevated ESR, which was normal in November after stopping prednisone .  She continues to have joint pain but is active physically and does not want to resume steroids.  Prn aleve      Osteopenia    Her T scores were -2.1 in 2015 and she was on several months of prednisone.  Prior adverse reaction to alendronate.  No history of BRCA  Adter discussion of Evista,  She is willing to start a trial       Hypertension    continue lisinopril 10 mg daily,   renal function was normal in November per outside labs       Relevant Medications   lisinopril (PRINIVIL,ZESTRIL) tablet   Heart murmur   Encounter for preventive health examination    Annual wellness  exam was done as well as a comprehensive physical exam and management of acute and chronic conditions .  During the course of the visit the patient was educated and counseled about appropriate screening and preventive services including :  diabetes screening, lipid  analysis with projected  10 year  risk for CAD , nutrition counseling, colorectal cancer screening, and recommended immunizations.  Printed recommendations for health maintenance screenings was given.         Other Visit Diagnoses    Hypothyroidism due to acquired atrophy of thyroid    -  Primary    Relevant Orders    TSH (Completed)    Hyperlipemia        Relevant Medications    lisinopril (PRINIVIL,ZESTRIL) tablet    Other Relevant Orders    Lipid panel (Completed)    Screening for breast cancer        Relevant Orders    MM DIGITAL SCREENING BILATERAL    Myalgia        Relevant Orders    Comp Met (CMET) (Completed)    Magnesium (Completed)    CK (Completed)    Need for prophylactic vaccination against Streptococcus pneumoniae (pneumococcus)        Relevant Orders    Pneumococcal polysaccharide vaccine 23-valent greater than or equal to 2yo subcutaneous/IM (Completed)

## 2014-10-08 NOTE — Progress Notes (Signed)
Pre-visit discussion using our clinic review tool. No additional management support is needed unless otherwise documented below in the visit note.  

## 2014-10-08 NOTE — Assessment & Plan Note (Signed)
Her T scores wer -2.1 in 2015 and she was on severla months of prednisone.  A\orior reaction to alendronate.  No history of BRCA  Adter discussion of Evista,  She is willing to start a trial

## 2014-10-08 NOTE — Patient Instructions (Addendum)
I am starting you on generic Evista for prevention of osteoporosis. It is taken dailly   If your thyroid level is normal I will refill your synthroid at its current dose  I recommend the 3 d mammogram and will order it for you  We will repeat your DEXA in a year  You received your last pneumonia vaccine today  Health Maintenance Adopting a healthy lifestyle and getting preventive care can go a long way to promote health and wellness. Talk with your health care provider about what schedule of regular examinations is right for you. This is a good chance for you to check in with your provider about disease prevention and staying healthy. In between checkups, there are plenty of things you can do on your own. Experts have done a lot of research about which lifestyle changes and preventive measures are most likely to keep you healthy. Ask your health care provider for more information. WEIGHT AND DIET  Eat a healthy diet  Be sure to include plenty of vegetables, fruits, low-fat dairy products, and lean protein.  Do not eat a lot of foods high in solid fats, added sugars, or salt.  Get regular exercise. This is one of the most important things you can do for your health.  Most adults should exercise for at least 150 minutes each week. The exercise should increase your heart rate and make you sweat (moderate-intensity exercise).  Most adults should also do strengthening exercises at least twice a week. This is in addition to the moderate-intensity exercise.  Maintain a healthy weight  Body mass index (BMI) is a measurement that can be used to identify possible weight problems. It estimates body fat based on height and weight. Your health care provider can help determine your BMI and help you achieve or maintain a healthy weight.  For females 75 years of age and older:   A BMI below 18.5 is considered underweight.  A BMI of 18.5 to 24.9 is normal.  A BMI of 25 to 29.9 is considered  overweight.  A BMI of 30 and above is considered obese.  Watch levels of cholesterol and blood lipids  You should start having your blood tested for lipids and cholesterol at 79 years of age, then have this test every 5 years.  You may need to have your cholesterol levels checked more often if:  Your lipid or cholesterol levels are high.  You are older than 79 years of age.  You are at high risk for heart disease.  CANCER SCREENING   Lung Cancer  Lung cancer screening is recommended for adults 34-65 years old who are at high risk for lung cancer because of a history of smoking.  A yearly low-dose CT scan of the lungs is recommended for people who:  Currently smoke.  Have quit within the past 15 years.  Have at least a 30-pack-year history of smoking. A pack year is smoking an average of one pack of cigarettes a day for 1 year.  Yearly screening should continue until it has been 15 years since you quit.  Yearly screening should stop if you develop a health problem that would prevent you from having lung cancer treatment.  Breast Cancer  Practice breast self-awareness. This means understanding how your breasts normally appear and feel.  It also means doing regular breast self-exams. Let your health care provider know about any changes, no matter how small.  If you are in your 20s or 30s, you should have a  clinical breast exam (CBE) by a health care provider every 1-3 years as part of a regular health exam.  If you are 83 or older, have a CBE every year. Also consider having a breast X-ray (mammogram) every year.  If you have a family history of breast cancer, talk to your health care provider about genetic screening.  If you are at high risk for breast cancer, talk to your health care provider about having an MRI and a mammogram every year.  Breast cancer gene (BRCA) assessment is recommended for women who have family members with BRCA-related cancers. BRCA-related  cancers include:  Breast.  Ovarian.  Tubal.  Peritoneal cancers.  Results of the assessment will determine the need for genetic counseling and BRCA1 and BRCA2 testing. Cervical Cancer Routine pelvic examinations to screen for cervical cancer are no longer recommended for nonpregnant women who are considered low risk for cancer of the pelvic organs (ovaries, uterus, and vagina) and who do not have symptoms. A pelvic examination may be necessary if you have symptoms including those associated with pelvic infections. Ask your health care provider if a screening pelvic exam is right for you.   The Pap test is the screening test for cervical cancer for women who are considered at risk.  If you had a hysterectomy for a problem that was not cancer or a condition that could lead to cancer, then you no longer need Pap tests.  If you are older than 65 years, and you have had normal Pap tests for the past 10 years, you no longer need to have Pap tests.  If you have had past treatment for cervical cancer or a condition that could lead to cancer, you need Pap tests and screening for cancer for at least 20 years after your treatment.  If you no longer get a Pap test, assess your risk factors if they change (such as having a new sexual partner). This can affect whether you should start being screened again.  Some women have medical problems that increase their chance of getting cervical cancer. If this is the case for you, your health care provider may recommend more frequent screening and Pap tests.  The human papillomavirus (HPV) test is another test that may be used for cervical cancer screening. The HPV test looks for the virus that can cause cell changes in the cervix. The cells collected during the Pap test can be tested for HPV.  The HPV test can be used to screen women 66 years of age and older. Getting tested for HPV can extend the interval between normal Pap tests from three to five  years.  An HPV test also should be used to screen women of any age who have unclear Pap test results.  After 79 years of age, women should have HPV testing as often as Pap tests.  Colorectal Cancer  This type of cancer can be detected and often prevented.  Routine colorectal cancer screening usually begins at 78 years of age and continues through 79 years of age.  Your health care provider may recommend screening at an earlier age if you have risk factors for colon cancer.  Your health care provider may also recommend using home test kits to check for hidden blood in the stool.  A small camera at the end of a tube can be used to examine your colon directly (sigmoidoscopy or colonoscopy). This is done to check for the earliest forms of colorectal cancer.  Routine screening usually begins at  age 22.  Direct examination of the colon should be repeated every 5-10 years through 79 years of age. However, you may need to be screened more often if early forms of precancerous polyps or small growths are found. Skin Cancer  Check your skin from head to toe regularly.  Tell your health care provider about any new moles or changes in moles, especially if there is a change in a mole's shape or color.  Also tell your health care provider if you have a mole that is larger than the size of a pencil eraser.  Always use sunscreen. Apply sunscreen liberally and repeatedly throughout the day.  Protect yourself by wearing long sleeves, pants, a wide-brimmed hat, and sunglasses whenever you are outside. HEART DISEASE, DIABETES, AND HIGH BLOOD PRESSURE   Have your blood pressure checked at least every 1-2 years. High blood pressure causes heart disease and increases the risk of stroke.  If you are between 12 years and 62 years old, ask your health care provider if you should take aspirin to prevent strokes.  Have regular diabetes screenings. This involves taking a blood sample to check your fasting  blood sugar level.  If you are at a normal weight and have a low risk for diabetes, have this test once every three years after 79 years of age.  If you are overweight and have a high risk for diabetes, consider being tested at a younger age or more often. PREVENTING INFECTION  Hepatitis B  If you have a higher risk for hepatitis B, you should be screened for this virus. You are considered at high risk for hepatitis B if:  You were born in a country where hepatitis B is common. Ask your health care provider which countries are considered high risk.  Your parents were born in a high-risk country, and you have not been immunized against hepatitis B (hepatitis B vaccine).  You have HIV or AIDS.  You use needles to inject street drugs.  You live with someone who has hepatitis B.  You have had sex with someone who has hepatitis B.  You get hemodialysis treatment.  You take certain medicines for conditions, including cancer, organ transplantation, and autoimmune conditions. Hepatitis C  Blood testing is recommended for:  Everyone born from [birth year] through 1963.  Anyone with known risk factors for hepatitis C. Sexually transmitted infections (STIs)  You should be screened for sexually transmitted infections (STIs) including gonorrhea and chlamydia if:  You are sexually active and are younger than 79 years of age.  You are older than 79 years of age and your health care provider tells you that you are at risk for this type of infection.  Your sexual activity has changed since you were last screened and you are at an increased risk for chlamydia or gonorrhea. Ask your health care provider if you are at risk.  If you do not have HIV, but are at risk, it may be recommended that you take a prescription medicine daily to prevent HIV infection. This is called pre-exposure prophylaxis (PrEP). You are considered at risk if:  You are sexually active and do not regularly use condoms or know  the HIV status of your partner(s).  You take drugs by injection.  You are sexually active with a partner who has HIV. Talk with your health care provider about whether you are at high risk of being infected with HIV. If you choose to begin PrEP, you should first be tested for HIV. You  should then be tested every 3 months for as long as you are taking PrEP.  PREGNANCY   If you are premenopausal and you may become pregnant, ask your health care provider about preconception counseling.  If you may become pregnant, take 400 to 800 micrograms (mcg) of folic acid every day.  If you want to prevent pregnancy, talk to your health care provider about birth control (contraception). OSTEOPOROSIS AND MENOPAUSE   Osteoporosis is a disease in which the bones lose minerals and strength with aging. This can result in serious bone fractures. Your risk for osteoporosis can be identified using a bone density scan.  If you are 64 years of age or older, or if you are at risk for osteoporosis and fractures, ask your health care provider if you should be screened.  Ask your health care provider whether you should take a calcium or vitamin D supplement to lower your risk for osteoporosis.  Menopause may have certain physical symptoms and risks.  Hormone replacement therapy may reduce some of these symptoms and risks. Talk to your health care provider about whether hormone replacement therapy is right for you.  HOME CARE INSTRUCTIONS   Schedule regular health, dental, and eye exams.  Stay current with your immunizations.   Do not use any tobacco products including cigarettes, chewing tobacco, or electronic cigarettes.  If you are pregnant, do not drink alcohol.  If you are breastfeeding, limit how much and how often you drink alcohol.  Limit alcohol intake to no more than 1 drink per day for nonpregnant women. One drink equals 12 ounces of beer, 5 ounces of wine, or 1 ounces of hard liquor.  Do not  use street drugs.  Do not share needles.  Ask your health care provider for help if you need support or information about quitting drugs.  Tell your health care provider if you often feel depressed.  Tell your health care provider if you have ever been abused or do not feel safe at home. Document Released: 01/29/2011 Document Revised: 11/30/2013 Document Reviewed: 06/17/2013 [facility] Patient Information 2015 [place], [place]. This information is not intended to replace advice given to you by your health care provider. Make sure you discuss any questions you have with your health care provider.

## 2014-10-08 NOTE — Assessment & Plan Note (Signed)
continue lisinopril   10 mg daily,   renla funciotn was normal in November per outside laabs

## 2014-10-09 LAB — TSH: TSH: 0.63 u[IU]/mL (ref 0.350–4.500)

## 2014-10-10 ENCOUNTER — Encounter: Payer: Self-pay | Admitting: Internal Medicine

## 2014-10-10 NOTE — Assessment & Plan Note (Signed)
Diagnosed last year with elevated ESR, which was normal in November after stopping prednisone .  She continues to have joint pain but is active physically and does not want to resume steroids.  Prn aleve

## 2014-10-10 NOTE — Assessment & Plan Note (Signed)

## 2014-10-11 ENCOUNTER — Telehealth: Payer: Self-pay | Admitting: Internal Medicine

## 2014-10-11 NOTE — Telephone Encounter (Signed)
emmi emailed °

## 2014-10-13 ENCOUNTER — Other Ambulatory Visit: Payer: Self-pay | Admitting: Internal Medicine

## 2014-11-02 ENCOUNTER — Ambulatory Visit
Admit: 2014-11-02 | Disposition: A | Discharge status: Home / Self Care | Payer: Self-pay | Attending: Internal Medicine | Admitting: Internal Medicine

## 2014-11-02 DIAGNOSIS — Z1231 Encounter for screening mammogram for malignant neoplasm of breast: Secondary | ICD-10-CM | POA: Diagnosis not present

## 2014-11-02 LAB — HM MAMMOGRAPHY: HM Mammogram: NEGATIVE

## 2014-11-02 NOTE — Telephone Encounter (Signed)
Patient was mailed unread message.

## 2014-12-16 DIAGNOSIS — H2512 Age-related nuclear cataract, left eye: Secondary | ICD-10-CM | POA: Diagnosis not present

## 2015-01-10 ENCOUNTER — Other Ambulatory Visit: Payer: Self-pay | Admitting: Internal Medicine

## 2015-01-16 DIAGNOSIS — H1013 Acute atopic conjunctivitis, bilateral: Secondary | ICD-10-CM | POA: Diagnosis not present

## 2015-01-16 DIAGNOSIS — L282 Other prurigo: Secondary | ICD-10-CM | POA: Diagnosis not present

## 2015-03-15 DIAGNOSIS — M25531 Pain in right wrist: Secondary | ICD-10-CM | POA: Diagnosis not present

## 2015-03-15 DIAGNOSIS — M1811 Unilateral primary osteoarthritis of first carpometacarpal joint, right hand: Secondary | ICD-10-CM | POA: Diagnosis not present

## 2015-03-15 DIAGNOSIS — H01119 Allergic dermatitis of unspecified eye, unspecified eyelid: Secondary | ICD-10-CM | POA: Diagnosis not present

## 2015-04-13 ENCOUNTER — Ambulatory Visit: Payer: Medicare Other | Admitting: Internal Medicine

## 2015-04-28 ENCOUNTER — Encounter: Payer: Self-pay | Admitting: Internal Medicine

## 2015-04-28 ENCOUNTER — Ambulatory Visit (INDEPENDENT_AMBULATORY_CARE_PROVIDER_SITE_OTHER): Payer: Medicare Other | Admitting: Internal Medicine

## 2015-04-28 VITALS — BP 140/76 | HR 73 | Temp 98.3°F | Resp 12 | Ht 66.0 in | Wt 156.2 lb

## 2015-04-28 DIAGNOSIS — I10 Essential (primary) hypertension: Secondary | ICD-10-CM

## 2015-04-28 DIAGNOSIS — E559 Vitamin D deficiency, unspecified: Secondary | ICD-10-CM

## 2015-04-28 DIAGNOSIS — M19012 Primary osteoarthritis, left shoulder: Secondary | ICD-10-CM | POA: Diagnosis not present

## 2015-04-28 DIAGNOSIS — Z23 Encounter for immunization: Secondary | ICD-10-CM | POA: Diagnosis not present

## 2015-04-28 DIAGNOSIS — E785 Hyperlipidemia, unspecified: Secondary | ICD-10-CM

## 2015-04-28 DIAGNOSIS — R011 Cardiac murmur, unspecified: Secondary | ICD-10-CM

## 2015-04-28 DIAGNOSIS — E034 Atrophy of thyroid (acquired): Secondary | ICD-10-CM | POA: Diagnosis not present

## 2015-04-28 DIAGNOSIS — M353 Polymyalgia rheumatica: Secondary | ICD-10-CM | POA: Diagnosis not present

## 2015-04-28 DIAGNOSIS — R5381 Other malaise: Secondary | ICD-10-CM | POA: Diagnosis not present

## 2015-04-28 DIAGNOSIS — E038 Other specified hypothyroidism: Secondary | ICD-10-CM | POA: Diagnosis not present

## 2015-04-28 DIAGNOSIS — E039 Hypothyroidism, unspecified: Secondary | ICD-10-CM | POA: Insufficient documentation

## 2015-04-28 LAB — LIPID PANEL
CHOL/HDL RATIO: 5
Cholesterol: 218 mg/dL — ABNORMAL HIGH (ref 0–200)
HDL: 44.7 mg/dL (ref >39.00)
LDL CALC: 145 mg/dL — AB (ref 0–99)
NONHDL: 172.97
TRIGLYCERIDES: 141 mg/dL (ref 0.0–149.0)
VLDL: 28.2 mg/dL (ref 0.0–40.0)

## 2015-04-28 LAB — TSH: TSH: 0.3 u[IU]/mL — ABNORMAL LOW (ref 0.35–4.50)

## 2015-04-28 LAB — COMPREHENSIVE METABOLIC PANEL
ALK PHOS: 47 U/L (ref 39–117)
ALT: 14 U/L (ref 0–35)
AST: 17 U/L (ref 0–37)
Albumin: 3.9 g/dL (ref 3.5–5.2)
BILIRUBIN TOTAL: 0.9 mg/dL (ref 0.2–1.2)
BUN: 16 mg/dL (ref 6–23)
CO2: 32 mEq/L (ref 19–32)
CREATININE: 0.93 mg/dL (ref 0.40–1.20)
Calcium: 10.2 mg/dL (ref 8.4–10.5)
Chloride: 101 mEq/L (ref 96–112)
GFR: 61.19 mL/min (ref >60.00)
GLUCOSE: 91 mg/dL (ref 70–99)
Potassium: 4.8 mEq/L (ref 3.5–5.1)
Sodium: 138 mEq/L (ref 135–145)
TOTAL PROTEIN: 6.8 g/dL (ref 6.0–8.3)

## 2015-04-28 LAB — VITAMIN B12: Vitamin B-12: >1500 pg/mL — ABNORMAL HIGH (ref 211–911)

## 2015-04-28 LAB — CBC WITH DIFFERENTIAL/PLATELET
BASOS ABS: 0 10*3/uL (ref 0.0–0.1)
BASOS PCT: 0.5 % (ref 0.0–3.0)
EOS PCT: 1.5 % (ref 0.0–5.0)
Eosinophils Absolute: 0.1 10*3/uL (ref 0.0–0.7)
HEMATOCRIT: 43.1 % (ref 36.0–46.0)
Hemoglobin: 14.1 g/dL (ref 12.0–15.0)
LYMPHS PCT: 27.1 % (ref 12.0–46.0)
Lymphs Abs: 2 10*3/uL (ref 0.7–4.0)
MCHC: 32.6 g/dL (ref 30.0–36.0)
MCV: 89.3 fl (ref 78.0–100.0)
MONOS PCT: 8.9 % (ref 3.0–12.0)
Monocytes Absolute: 0.7 10*3/uL (ref 0.1–1.0)
NEUTROS ABS: 4.6 10*3/uL (ref 1.4–7.7)
Neutrophils Relative %: 62 % (ref 43.0–77.0)
PLATELETS: 209 10*3/uL (ref 150.0–400.0)
RBC: 4.82 Mil/uL (ref 3.87–5.11)
RDW: 14.8 % (ref 11.5–15.5)
WBC: 7.5 10*3/uL (ref 4.0–10.5)

## 2015-04-28 LAB — IRON AND TIBC
%SAT: 30 % (ref 11–50)
IRON: 100 ug/dL (ref 45–160)
TIBC: 335 ug/dL (ref 250–450)
UIBC: 235 ug/dL (ref 125–400)

## 2015-04-28 LAB — C-REACTIVE PROTEIN: CRP: 0.4 mg/dL — AB (ref 0.5–20.0)

## 2015-04-28 LAB — SEDIMENTATION RATE: SED RATE: 14 mm/h (ref 0–22)

## 2015-04-28 LAB — VITAMIN D 25 HYDROXY (VIT D DEFICIENCY, FRACTURES): VITD: 32.88 ng/mL (ref 30.00–100.00)

## 2015-04-28 MED ORDER — LEVOTHYROXINE SODIUM 75 MCG PO TABS: 75.0000 ug | ORAL_TABLET | Freq: Every day | ORAL | Status: DC

## 2015-04-28 NOTE — Progress Notes (Signed)
Subjective:  Patient ID: Kathryn Wise, female    DOB: Jan 08, 1930  Age: 81 y.o. MRN: [id number]  CC: The primary encounter diagnosis was Complaint of debility and malaise. Diagnoses of Polymyalgia rheumatica, Primary osteoarthritis of left shoulder, Vitamin D deficiency, Hypothyroidism due to acquired atrophy of thyroid, Hyperlipidemia, Encounter for immunization, Heart murmur, and Essential hypertension were also pertinent to this visit.  HPI Kathryn Wise presents for 6 month follow up on hypothyroid, hypertension, osteopenia treated now with Evista, and diffused joint pain originally treated as PMR by Rheumatology, off of prednisone for the past year.   She was treated in mid August for right wrist pain and swelling by Urgent Care after falling while golfing.  Wrist was x rayed and she was treated for a sprain .  She denies wist pain currently and barely remembers the prior evaluation at [facility].  [name]   She was also treated for allergic conjunctivitis and is taking zyrtec prn symptoms.     Cc: intermittent malaise.  "I got the shakes for the last 2-3 days. Intermittent,  Entire body just feels lousy,  Resolves with activity.  Feels like her enitre body is fluttering .  Not interfering with sleep.  Has had insomnia for the last 2-3 days ,  Took a sleeping pill last ., OTC .  In the past used Ambien on occasion which worked well.       Outpatient Prescriptions Prior to Visit  Medication Sig Dispense Refill  . lisinopril (PRINIVIL,ZESTRIL) 10 MG tablet TAKE ONE TABLET BY MOUTH ONCE DAILY. 90 tablet 1  . Multiple Vitamin (MULTIVITAMIN WITH MINERALS) TABS Take 1 tablet by mouth daily.    [name] omeprazole (PRILOSEC) 20 MG capsule TAKE 1 CAPSULE BY MOUTH EVERY MORNING, PRIOR TO BREAKFAST. 90 capsule 1  . raloxifene (EVISTA) 60 MG tablet Take 1 tablet (60 mg total) by mouth daily. 30 tablet 5  . levothyroxine (SYNTHROID, LEVOTHROID) 88 MCG tablet TAKE 1 TABLET BY MOUTH ONCE DAILY ON AN EMPTY  STOMACH. WAIT 30 MINUTES BEFORE TAKING OTHER MEDS. 30 tablet 6   No facility-administered medications prior to visit.    Review of Systems;  Patient denies headache, fevers, malaise, unintentional weight loss, skin rash, eye pain, sinus congestion and sinus pain, sore throat, dysphagia,  hemoptysis , cough, dyspnea, wheezing, chest pain, palpitations, orthopnea, edema, abdominal pain, nausea, melena, diarrhea, constipation, flank pain, dysuria, hematuria, urinary  Frequency, nocturia, numbness, tingling, seizures,  Focal weakness, Loss of consciousness,  Tremor, insomnia, depression, anxiety, and suicidal ideation.      Objective:  BP 140/76 mmHg  Pulse 73  Temp(Src) 98.3 F (36.8 C) (Oral)  Resp 12  Ht 5\' 6"  (1.676 m)  Wt 156 lb 4 oz (70.875 kg)  BMI 25.23 kg/m2  SpO2 96%  BP Readings from Last 3 Encounters:  04/28/15 140/76  10/08/14 148/64  01/14/14 148/80    Wt Readings from Last 3 Encounters:  04/28/15 156 lb 4 oz (70.875 kg)  10/08/14 155 lb 8 oz (70.534 kg)  01/13/14 159 lb 8 oz (72.349 kg)    General appearance: alert, cooperative and appears stated age Ears: normal TM's and external ear canals both ears Throat: lips, mucosa, and tongue normal; teeth and gums normal Neck: no adenopathy, no carotid bruit, supple, symmetrical, trachea midline and thyroid not enlarged, symmetric, no tenderness/mass/nodules Back: symmetric, no curvature. ROM normal. No CVA tenderness. Lungs: clear to auscultation bilaterally Heart: regular rate and rhythm, S1, S2 normal, no murmur, click, rub  or gallop Abdomen: soft, non-tender; bowel sounds normal; no masses,  no organomegaly Pulses: 2+ and symmetric Skin: Skin color, texture, turgor normal. No rashes or lesions Lymph nodes: Cervical, supraclavicular, and axillary nodes normal. Neuro: CNs 2-12 intact. DTRs 2+/4 in biceps, brachioradialis, patellars and achilles. Muscle strength 5/5 in upper and lower exremities. Fine resting tremor  bilaterally both hands cerebellar function normal. Romberg negative.  No pronator drift.   Gait normal.   No results found for: HGBA1C  Lab Results  Component Value Date   CREATININE 0.93 04/28/2015   CREATININE 0.93 10/08/2014   CREATININE 1.1 06/07/2014    Lab Results  Component Value Date   WBC 7.5 04/28/2015   HGB 14.1 04/28/2015   HCT 43.1 04/28/2015   PLT 209.0 04/28/2015   GLUCOSE 91 04/28/2015   CHOL 218* 04/28/2015   TRIG 141.0 04/28/2015   HDL 44.70 04/28/2015   LDLDIRECT 209.0 09/10/2013   LDLCALC 145* 04/28/2015   ALT 14 04/28/2015   AST 17 04/28/2015   NA 138 04/28/2015   K 4.8 04/28/2015   CL 101 04/28/2015   CREATININE 0.93 04/28/2015   BUN 16 04/28/2015   CO2 32 04/28/2015   TSH 0.30* 04/28/2015   INR 0.98 07/09/2012    No results found.  Assessment & Plan:   Problem List Items Addressed This Visit    Heart murmur    She is asymptomatic and prefers not to work up her murmur      Hypertension    Well controlled on current regimen. Renal function stable, no changes today.  Lab Results  Component Value Date   CREATININE 0.93 04/28/2015   Lab Results  Component Value Date   NA 138 04/28/2015   K 4.8 04/28/2015   CL 101 04/28/2015   CO2 32 04/28/2015         Complaint of debility and malaise - Primary    She appears well and her strength is normal.  CC is normal. Thyroid appears slightly overactive.  Will reduce her thyroid supplement and recheck in 6 weeks.  Lab Results  Component Value Date   WBC 7.5 04/28/2015   HGB 14.1 04/28/2015   HCT 43.1 04/28/2015   MCV 89.3 04/28/2015   PLT 209.0 04/28/2015   No results found for: IRON, TIBC, FERRITIN  Lab Results  Component Value Date   TSH 0.30* 04/28/2015         Relevant Orders   Comprehensive metabolic panel (Completed)   Iron and TIBC   Vitamin B12 (Completed)   Hypothyroidism    Thyroid appears slightly overactive.  Will reduce her thyroid supplement and recheck in 6  weeks.   Lab Results  Component Value Date   TSH 0.30* 04/28/2015         Relevant Medications   levothyroxine (SYNTHROID, LEVOTHROID) 75 MCG tablet   Other Relevant Orders   TSH (Completed)   Polymyalgia rheumatica   Relevant Orders   CBC with Differential/Platelet (Completed)   Sedimentation rate (Completed)   C-reactive protein (Completed)   Osteoarthritis of left shoulder    Other Visit Diagnoses    Vitamin D deficiency        Relevant Orders    Vit D  25 hydroxy (rtn osteoporosis monitoring) (Completed)    Hyperlipidemia        Relevant Orders    Lipid panel (Completed)    Encounter for immunization           I have changed Ms. Grunwald's levothyroxine.  I am also having her maintain her multivitamin with minerals, omeprazole, raloxifene, and lisinopril.  Meds ordered this encounter  Medications  . levothyroxine (SYNTHROID, LEVOTHROID) 75 MCG tablet    Sig: Take 1 tablet (75 mcg total) by mouth daily before breakfast.    Dispense:  90 tablet    Refill:  1    PHARAMACIST NOTE THE  DOSE DECREASE    Medications Discontinued During This Encounter  Medication Reason  . levothyroxine (SYNTHROID, LEVOTHROID) 88 MCG tablet Reorder    Follow-up: No Follow-up on file.   [name], MD

## 2015-04-28 NOTE — Assessment & Plan Note (Signed)
Well controlled on current regimen. Renal function stable, no changes today.  Lab Results  Component Value Date   CREATININE 0.93 04/28/2015   Lab Results  Component Value Date   NA 138 04/28/2015   K 4.8 04/28/2015   CL 101 04/28/2015   CO2 32 04/28/2015

## 2015-04-28 NOTE — Patient Instructions (Signed)
I am evaluating your B12 ,  Iron stores, thyroid, etc today to make sure there are no deficiencies  Avoid sleep aids that have diphenhydramine and doxylamine because they can cause confusion, delirium and urinary retention  Try melatonin instead

## 2015-04-28 NOTE — Assessment & Plan Note (Signed)
Thyroid appears slightly overactive.  Will reduce her thyroid supplement and recheck in 6 weeks.   Lab Results  Component Value Date   TSH 0.30* 04/28/2015

## 2015-04-28 NOTE — Assessment & Plan Note (Addendum)
She appears well and her strength is normal.  CC is normal. Thyroid appears slightly overactive.  Will reduce her thyroid supplement and recheck in 6 weeks.  Lab Results  Component Value Date   WBC 7.5 04/28/2015   HGB 14.1 04/28/2015   HCT 43.1 04/28/2015   MCV 89.3 04/28/2015   PLT 209.0 04/28/2015   No results found for: IRON, TIBC, FERRITIN  Lab Results  Component Value Date   TSH 0.30* 04/28/2015

## 2015-04-28 NOTE — Assessment & Plan Note (Signed)
She is asymptomatic and prefers not to work up her murmur

## 2015-04-28 NOTE — Progress Notes (Signed)
Quick Note:  All labs are normal thus far except thyOrid is OVER ACTIVE. I have reduced her levothyroxine dose, please change dose immediately. Repeat TSH in 6 weeks ______

## 2015-04-28 NOTE — Progress Notes (Signed)
Pre-visit discussion using our clinic review tool. No additional management support is needed unless otherwise documented below in the visit note.  

## 2015-04-29 ENCOUNTER — Telehealth: Payer: Self-pay

## 2015-04-29 NOTE — Telephone Encounter (Signed)
Pt called to get labs they were given to her and scheduled f/u lab on TSH. She was also told of her new rx at pharmacy.

## 2015-05-03 ENCOUNTER — Telehealth: Payer: Self-pay | Admitting: Internal Medicine

## 2015-05-03 NOTE — Telephone Encounter (Signed)
Pt called returning your call from today. Thank You!

## 2015-05-03 NOTE — Telephone Encounter (Signed)
Spoke with patient,

## 2015-05-10 DIAGNOSIS — L820 Inflamed seborrheic keratosis: Secondary | ICD-10-CM | POA: Diagnosis not present

## 2015-05-10 DIAGNOSIS — Z85828 Personal history of other malignant neoplasm of skin: Secondary | ICD-10-CM | POA: Diagnosis not present

## 2015-05-10 DIAGNOSIS — D229 Melanocytic nevi, unspecified: Secondary | ICD-10-CM | POA: Diagnosis not present

## 2015-05-10 DIAGNOSIS — L821 Other seborrheic keratosis: Secondary | ICD-10-CM | POA: Diagnosis not present

## 2015-05-10 DIAGNOSIS — Z1283 Encounter for screening for malignant neoplasm of skin: Secondary | ICD-10-CM | POA: Diagnosis not present

## 2015-05-10 DIAGNOSIS — L578 Other skin changes due to chronic exposure to nonionizing radiation: Secondary | ICD-10-CM | POA: Diagnosis not present

## 2015-05-10 DIAGNOSIS — L814 Other melanin hyperpigmentation: Secondary | ICD-10-CM | POA: Diagnosis not present

## 2015-05-10 DIAGNOSIS — D1800 Hemangioma unspecified site: Secondary | ICD-10-CM | POA: Diagnosis not present

## 2015-06-13 ENCOUNTER — Telehealth: Payer: Self-pay | Admitting: *Deleted

## 2015-06-13 ENCOUNTER — Other Ambulatory Visit (INDEPENDENT_AMBULATORY_CARE_PROVIDER_SITE_OTHER): Payer: Medicare Other

## 2015-06-13 DIAGNOSIS — E034 Atrophy of thyroid (acquired): Secondary | ICD-10-CM | POA: Diagnosis not present

## 2015-06-13 DIAGNOSIS — E038 Other specified hypothyroidism: Secondary | ICD-10-CM

## 2015-06-13 NOTE — Addendum Note (Signed)
Addended by: [name] on: 06/13/2015 12:05 PM   Modules accepted: Orders

## 2015-06-13 NOTE — Telephone Encounter (Signed)
Labs and dx?  

## 2015-06-14 LAB — T4 AND TSH
T4, Total: 6.4 ug/dL (ref 4.5–12.0)
TSH: 4.94 u[IU]/mL — ABNORMAL HIGH (ref 0.450–4.500)

## 2015-06-14 NOTE — Assessment & Plan Note (Signed)
Underactive with dose reduction doen after last visit in September.  Continue 75 mcg daily increase to 150 mcg on Sundays only.  repeat in 6 weeks

## 2015-06-14 NOTE — Addendum Note (Signed)
Addended by: [name] on: 06/14/2015 11:00 AM   Modules accepted: Orders

## 2015-07-09 ENCOUNTER — Other Ambulatory Visit: Payer: Self-pay | Admitting: Internal Medicine

## 2015-07-26 ENCOUNTER — Other Ambulatory Visit (INDEPENDENT_AMBULATORY_CARE_PROVIDER_SITE_OTHER): Payer: Medicare Other

## 2015-07-26 DIAGNOSIS — E034 Atrophy of thyroid (acquired): Secondary | ICD-10-CM

## 2015-07-26 DIAGNOSIS — E038 Other specified hypothyroidism: Secondary | ICD-10-CM

## 2015-07-27 ENCOUNTER — Encounter: Payer: Self-pay | Admitting: *Deleted

## 2015-07-27 LAB — T4 AND TSH
T4 TOTAL: 7.8 ug/dL (ref 4.5–12.0)
TSH: 1.94 u[IU]/mL (ref 0.450–4.500)

## 2015-07-28 DIAGNOSIS — L821 Other seborrheic keratosis: Secondary | ICD-10-CM | POA: Diagnosis not present

## 2015-07-28 DIAGNOSIS — L820 Inflamed seborrheic keratosis: Secondary | ICD-10-CM | POA: Diagnosis not present

## 2015-07-28 DIAGNOSIS — L578 Other skin changes due to chronic exposure to nonionizing radiation: Secondary | ICD-10-CM | POA: Diagnosis not present

## 2015-07-29 ENCOUNTER — Other Ambulatory Visit: Payer: Self-pay | Admitting: Internal Medicine

## 2015-08-02 ENCOUNTER — Telehealth: Payer: Self-pay | Admitting: *Deleted

## 2015-08-02 NOTE — Telephone Encounter (Signed)
Spoke with the patient, reviewed labs with her.  Notified her that [facility] mailed results to her.  She was concerned as she needed to get a refill on the medications.

## 2015-08-02 NOTE — Telephone Encounter (Signed)
Patient has requested a call in regards to her lab results drawn 07/26/15. Please advise

## 2015-10-06 DIAGNOSIS — H169 Unspecified keratitis: Secondary | ICD-10-CM | POA: Diagnosis not present

## 2015-10-12 DIAGNOSIS — L820 Inflamed seborrheic keratosis: Secondary | ICD-10-CM | POA: Diagnosis not present

## 2015-10-12 DIAGNOSIS — L821 Other seborrheic keratosis: Secondary | ICD-10-CM | POA: Diagnosis not present

## 2015-10-12 DIAGNOSIS — L578 Other skin changes due to chronic exposure to nonionizing radiation: Secondary | ICD-10-CM | POA: Diagnosis not present

## 2015-10-17 ENCOUNTER — Telehealth: Payer: Self-pay | Admitting: Internal Medicine

## 2015-10-17 DIAGNOSIS — E785 Hyperlipidemia, unspecified: Secondary | ICD-10-CM

## 2015-10-17 DIAGNOSIS — E034 Atrophy of thyroid (acquired): Secondary | ICD-10-CM

## 2015-10-17 DIAGNOSIS — I10 Essential (primary) hypertension: Secondary | ICD-10-CM

## 2015-10-17 DIAGNOSIS — E559 Vitamin D deficiency, unspecified: Secondary | ICD-10-CM

## 2015-10-17 DIAGNOSIS — R5383 Other fatigue: Secondary | ICD-10-CM

## 2015-10-17 NOTE — Telephone Encounter (Signed)
Needing lab orders for physical

## 2015-10-17 NOTE — Telephone Encounter (Signed)
Please advise, thanks.

## 2015-10-17 NOTE — Telephone Encounter (Signed)
Thanks

## 2015-10-20 DIAGNOSIS — H169 Unspecified keratitis: Secondary | ICD-10-CM | POA: Diagnosis not present

## 2015-10-29 ENCOUNTER — Other Ambulatory Visit: Payer: Self-pay | Admitting: Internal Medicine

## 2015-10-31 ENCOUNTER — Other Ambulatory Visit (INDEPENDENT_AMBULATORY_CARE_PROVIDER_SITE_OTHER): Payer: Medicare Other

## 2015-10-31 DIAGNOSIS — I10 Essential (primary) hypertension: Secondary | ICD-10-CM | POA: Diagnosis not present

## 2015-10-31 DIAGNOSIS — R5383 Other fatigue: Secondary | ICD-10-CM

## 2015-10-31 DIAGNOSIS — E034 Atrophy of thyroid (acquired): Secondary | ICD-10-CM | POA: Diagnosis not present

## 2015-10-31 DIAGNOSIS — E785 Hyperlipidemia, unspecified: Secondary | ICD-10-CM

## 2015-10-31 DIAGNOSIS — E038 Other specified hypothyroidism: Secondary | ICD-10-CM

## 2015-10-31 DIAGNOSIS — E559 Vitamin D deficiency, unspecified: Secondary | ICD-10-CM

## 2015-10-31 LAB — CBC WITH DIFFERENTIAL/PLATELET
BASOS ABS: 0 10*3/uL (ref 0.0–0.1)
Basophils Relative: 0.5 % (ref 0.0–3.0)
EOS ABS: 0.2 10*3/uL (ref 0.0–0.7)
EOS PCT: 1.8 % (ref 0.0–5.0)
HEMATOCRIT: 40.2 % (ref 36.0–46.0)
HEMOGLOBIN: 13.3 g/dL (ref 12.0–15.0)
LYMPHS PCT: 26.2 % (ref 12.0–46.0)
Lymphs Abs: 2.2 10*3/uL (ref 0.7–4.0)
MCHC: 33 g/dL (ref 30.0–36.0)
MCV: 88.2 fl (ref 78.0–100.0)
MONO ABS: 0.8 10*3/uL (ref 0.1–1.0)
MONOS PCT: 8.9 % (ref 3.0–12.0)
Neutro Abs: 5.3 10*3/uL (ref 1.4–7.7)
Neutrophils Relative %: 62.6 % (ref 43.0–77.0)
PLATELETS: 197 10*3/uL (ref 150.0–400.0)
RBC: 4.56 Mil/uL (ref 3.87–5.11)
RDW: 15.3 % (ref 11.5–15.5)
WBC: 8.4 10*3/uL (ref 4.0–10.5)

## 2015-10-31 LAB — LIPID PANEL
CHOL/HDL RATIO: 4
Cholesterol: 203 mg/dL — ABNORMAL HIGH (ref 0–200)
HDL: 48.3 mg/dL (ref >39.00)
LDL CALC: 129 mg/dL — AB (ref 0–99)
NONHDL: 154.49
Triglycerides: 126 mg/dL (ref 0.0–149.0)
VLDL: 25.2 mg/dL (ref 0.0–40.0)

## 2015-10-31 LAB — COMPREHENSIVE METABOLIC PANEL
ALK PHOS: 45 U/L (ref 39–117)
ALT: 15 U/L (ref 0–35)
AST: 21 U/L (ref 0–37)
Albumin: 4 g/dL (ref 3.5–5.2)
BUN: 16 mg/dL (ref 6–23)
CO2: 31 mEq/L (ref 19–32)
Calcium: 10.1 mg/dL (ref 8.4–10.5)
Chloride: 102 mEq/L (ref 96–112)
Creatinine, Ser: 1.04 mg/dL (ref 0.40–1.20)
GFR: 53.72 mL/min — ABNORMAL LOW (ref >60.00)
GLUCOSE: 104 mg/dL — AB (ref 70–99)
POTASSIUM: 4.7 meq/L (ref 3.5–5.1)
SODIUM: 138 meq/L (ref 135–145)
TOTAL PROTEIN: 7 g/dL (ref 6.0–8.3)
Total Bilirubin: 0.6 mg/dL (ref 0.2–1.2)

## 2015-10-31 LAB — TSH: TSH: 8.53 u[IU]/mL — ABNORMAL HIGH (ref 0.35–4.50)

## 2015-10-31 LAB — VITAMIN D 25 HYDROXY (VIT D DEFICIENCY, FRACTURES): VITD: 33.08 ng/mL (ref 30.00–100.00)

## 2015-10-31 NOTE — Telephone Encounter (Signed)
Held refill until MD sees labs.TSH 8.53

## 2015-11-02 ENCOUNTER — Ambulatory Visit (INDEPENDENT_AMBULATORY_CARE_PROVIDER_SITE_OTHER): Payer: Medicare Other | Admitting: Internal Medicine

## 2015-11-02 ENCOUNTER — Encounter: Payer: Self-pay | Admitting: Internal Medicine

## 2015-11-02 ENCOUNTER — Encounter: Payer: Self-pay | Admitting: *Deleted

## 2015-11-02 VITALS — BP 148/76 | HR 69 | Temp 98.0°F | Resp 12 | Ht 63.5 in | Wt 163.0 lb

## 2015-11-02 DIAGNOSIS — Z1239 Encounter for other screening for malignant neoplasm of breast: Secondary | ICD-10-CM | POA: Diagnosis not present

## 2015-11-02 DIAGNOSIS — F320 Major depressive disorder, single episode, mild: Secondary | ICD-10-CM

## 2015-11-02 DIAGNOSIS — R0609 Other forms of dyspnea: Secondary | ICD-10-CM | POA: Diagnosis not present

## 2015-11-02 DIAGNOSIS — F32A Depression, unspecified: Secondary | ICD-10-CM

## 2015-11-02 DIAGNOSIS — F329 Major depressive disorder, single episode, unspecified: Secondary | ICD-10-CM | POA: Diagnosis not present

## 2015-11-02 DIAGNOSIS — E039 Hypothyroidism, unspecified: Secondary | ICD-10-CM

## 2015-11-02 DIAGNOSIS — Z0000 Encounter for general adult medical examination without abnormal findings: Secondary | ICD-10-CM | POA: Diagnosis not present

## 2015-11-02 MED ORDER — ESCITALOPRAM OXALATE 10 MG PO TABS: 5.0000 mg | ORAL_TABLET | Freq: Every day | ORAL | Status: DC

## 2015-11-02 NOTE — Patient Instructions (Addendum)
Let's try alternating between 75 and 88 mcg of levothyroxine fo rhte next 6 weeks   Trial of Lexapro to help your mood. Please start the Lexapro (escitalopram) at 1/2 tablet daily in the evening for the first few days to avoid nausea.  You can increase to a full tablet after 4 days if you havenot developed side effects of nausea.  If the lexapro interferes with your sleep, take it in the morning instead  Please return in  4 weeks    I think your shortness of breath is due to  Heart valve problem.  Cardiology referral is in process

## 2015-11-02 NOTE — Progress Notes (Signed)
Pre-visit discussion using our clinic review tool. No additional management support is needed unless otherwise documented below in the visit note.  

## 2015-11-02 NOTE — Progress Notes (Signed)
Patient ID: Kathryn Wise, female    DOB: 1929/10/17  Age: 82 y.o. MRN: [id number]  The patient is here for annual Medicare wellness examination and management of other chronic and acute problems.   The risk factors are reflected in the social history.  The roster of all physicians providing medical care to patient - is listed in the Snapshot section of the chart.  Activities of daily living:  The patient is 100% independent in all ADLs: dressing, toileting, feeding as well as independent mobility  Home safety : The patient has smoke detectors in the home. They wear seatbelts.  There are no firearms at home. There is no violence in the home.   There is no risks for hepatitis, STDs or HIV. There is no   history of blood transfusion. They have no travel history to infectious disease endemic areas of the world.  The patient has seen their dentist in the last six month. They have seen their eye doctor in the last year. They admit to slight hearing difficulty with regard to whispered voices and some television programs.  They have deferred audiologic testing in the last year.  They do not  have excessive sun exposure. Discussed the need for sun protection: hats, long sleeves and use of sunscreen if there is significant sun exposure.   Diet: the importance of a healthy diet is discussed. They do have a healthy diet.  The benefits of regular aerobic exercise were discussed. She walks 4 times per week ,  20 minutes.   Depression screen: there were several signs or vegative symptoms of depression-. loneliness and grief over loss of son , lack of meaningful interactions with daughter .  Cognitive assessment: the patient manages all their financial and personal affairs and is actively engaged. They could relate day,date,year and events; recalled 2/3 objects at 3 minutes; performed clock-face test normally.  The following portions of the patient's history were reviewed and updated as appropriate:  allergies, current medications, past family history, past medical history,  past surgical history, past social history  and problem list.  Visual acuity was not assessed per patient preference since she has regular follow up with her ophthalmologist. Hearing and body mass index were assessed and reviewed.   During the course of the visit the patient was educated and counseled about appropriate screening and preventive services including : fall prevention , diabetes screening, nutrition counseling, colorectal cancer screening, and recommended immunizations.    CC: The primary encounter diagnosis was Exertional dyspnea. Diagnoses of Breast cancer screening, Depression, Encounter for preventive health examination, Major depressive disorder, single episode, mild ([place]), and Hypothyroidism, unspecified hypothyroidism type were also pertinent to this visit.  Sept visit: thyroid was overactive,  Dose reduced, reperat thyroid level was 1.9  .  Currently  thyroid is underactive again.    Dry eye really bothering her.  First tried [place].  $400 out of pocket didn't work,  Now On second medication , for a week. restasis.    Mood :  Lonely,  Misses son,  Daughter works too much to provide companionship.   Lab Results  Component Value Date   TSH 8.53* 10/31/2015   Has been short of breath for two months .  Works out every morning at [employer] with no problems until the last few weeks and got so short of breath she was gasping.  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   She has past surgical history that includes Foot surgery; Arthroscopic rotator cuff repair; Cataract extraction w/  intraocular lens implant; Skin cancer excision; Facelift; Total shoulder arthroplasty (07/14/2012); and Joint replacement (Left, Dec 2013).   Her family history includes Cancer in her daughter and son.She reports that she quit smoking about 43 years ago. Her smoking use included Cigarettes. She does not have any smokeless tobacco history on file. She reports that she drinks alcohol. She reports that she does not use illicit drugs.  Outpatient Prescriptions Prior to Visit  Medication Sig Dispense Refill  . lisinopril (PRINIVIL,ZESTRIL) 10 MG tablet TAKE ONE TABLET BY MOUTH ONCE DAILY. 90 tablet 2  . Multiple Vitamin (MULTIVITAMIN WITH MINERALS) TABS Take 1 tablet by mouth daily.    [name] omeprazole (PRILOSEC) 20 MG capsule TAKE 1 CAPSULE BY MOUTH EVERY MORNING, PRIOR TO BREAKFAST. 90 capsule 1  . levothyroxine (SYNTHROID, LEVOTHROID) 88 MCG tablet Take 1 tablet (88 mcg total) by mouth daily before breakfast. 90 tablet 0  . raloxifene (EVISTA) 60 MG tablet Take 1 tablet (60 mg total) by mouth daily. (Patient not taking: Reported on 11/02/2015) 30 tablet 5   No facility-administered medications prior to visit.    Review of Systems   Patient denies headache, fevers, malaise, unintentional weight loss, skin rash, eye pain, sinus congestion and sinus pain, sore throat, dysphagia,  hemoptysis , cough,  wheezing, chest pain, palpitations, orthopnea, edema, abdominal pain, nausea, melena, diarrhea, constipation, flank pain, dysuria, hematuria, urinary  Frequency, nocturia, numbness, tingling, seizures,  Focal weakness, Loss of consciousness,  Tremor, insomnia, depression, anxiety, and suicidal ideation.      Objective:  BP 148/76 mmHg  Pulse 69  Temp(Src) 98 F (36.7 C) (Oral)  Resp 12  Ht 5' 3.5" (1.613 m)  Wt 163 lb (73.936 kg)  BMI 28.42 kg/m2  SpO2 97%  Physical Exam   General appearance: alert, cooperative and appears stated age Head: Normocephalic, without obvious abnormality,  atraumatic Eyes: conjunctivae/corneas clear. PERRL, EOM's intact. Fundi benign. Ears: normal TM's and external ear canals both ears Nose: Nares normal. Septum midline. Mucosa normal. No drainage or sinus tenderness. Throat: lips, mucosa, and tongue normal; teeth and gums normal Neck: no adenopathy, no carotid bruit, no JVD, supple, symmetrical, trachea midline and thyroid not enlarged, symmetric, no tenderness/mass/nodules Lungs: clear to auscultation bilaterally Breasts: normal appearance, no masses or tenderness Heart: regular rate and rhythm, S1, S2 normal,  systolic murmur present , no click , rub or gallop Abdomen: soft, non-tender; bowel sounds normal; no masses,  no organomegaly Extremities: extremities normal, atraumatic, no cyanosis or edema Pulses: 2+ and symmetric Skin: Skin color, texture, turgor normal. No rashes or lesions Neurologic: Alert and oriented X 3, normal strength and tone. Normal symmetric reflexes. Normal coordination and gait.     Assessment & Plan:   Problem List Items Addressed This Visit    Encounter for preventive health examination    Annual Medicare wellness  exam was done as well as a comprehensive physical exam and management of acute and chronic conditions .  During the course of the visit the patient was educated and counseled about appropriate screening and preventive services including : fall prevention , diabetes screening, nutrition counseling, colorectal cancer screening, and recommended immunizations.  Printed recommendations for health maintenance screenings was given.       Hypothyroidism    Thyroid is underactive.  Will alternate between 75 and 88 mcg daily       Relevant Medications   levothyroxine (SYNTHROID, LEVOTHROID) 88 MCG tablet   RESOLVED: Depression   Relevant Medications   escitalopram (LEXAPRO) 10 MG tablet   Major depressive disorder, single episode    Trial of lexapro. Starting with 5 mg daily      Relevant Medications    escitalopram (LEXAPRO) 10 MG tablet   Exertional dyspnea - Primary    She has had a heart murmur for years and has deferred workup until now.  suspect her dyspnea is due to aortic stenosis .  Cardiology referral in progress.      Relevant Orders   Ambulatory referral to Cardiology    Other Visit Diagnoses    Breast cancer screening        Relevant Orders    MM DIGITAL SCREENING BILATERAL       I have discontinued Ms. Lorenz's raloxifene. I am also having her start on escitalopram. Additionally, I am having her maintain her multivitamin with minerals, omeprazole, lisinopril, RESTASIS, LOTEMAX, and levothyroxine.  Meds ordered this encounter  Medications  . RESTASIS 0.05 % ophthalmic emulsion    Sig:   . LOTEMAX 0.5 % ophthalmic suspension    Sig:   . DISCONTD: levothyroxine (SYNTHROID, LEVOTHROID) 75 MCG tablet    Sig:   . levothyroxine (SYNTHROID, LEVOTHROID) 88 MCG tablet    Sig: Take 88 mcg by mouth daily before breakfast.  . escitalopram (LEXAPRO) 10 MG tablet    Sig: Take 0.5 tablets (5 mg total) by mouth daily. Increase to full tablet after two weeks    Dispense:  30 tablet    Refill:  1    Medications Discontinued During This Encounter  Medication Reason  . levothyroxine (SYNTHROID, LEVOTHROID) 88 MCG tablet   . levothyroxine (SYNTHROID, LEVOTHROID) 75 MCG tablet Error  . raloxifene (EVISTA) 60 MG tablet     Follow-up: No Follow-up on file.   [name], MD

## 2015-11-05 DIAGNOSIS — R0609 Other forms of dyspnea: Principal | ICD-10-CM | POA: Insufficient documentation

## 2015-11-05 DIAGNOSIS — F329 Major depressive disorder, single episode, unspecified: Secondary | ICD-10-CM

## 2015-11-05 HISTORY — DX: Major depressive disorder, single episode, unspecified: F32.9

## 2015-11-05 NOTE — Assessment & Plan Note (Signed)
Thyroid is underactive.  Will alternate between 75 and 88 mcg daily

## 2015-11-05 NOTE — Assessment & Plan Note (Signed)
Trial of lexapro. Starting with 5 mg daily

## 2015-11-05 NOTE — Assessment & Plan Note (Signed)

## 2015-11-05 NOTE — Assessment & Plan Note (Signed)
She has had a heart murmur for years and has deferred workup until now.  suspect her dyspnea is due to aortic stenosis .  Cardiology referral in progress.

## 2015-11-16 ENCOUNTER — Other Ambulatory Visit: Payer: Self-pay | Admitting: Internal Medicine

## 2015-11-16 ENCOUNTER — Ambulatory Visit
Admission: RE | Admit: 2015-11-16 | Discharge: 2015-11-16 | Disposition: A | Discharge status: Home / Self Care | Payer: Medicare Other | Source: Ambulatory Visit | Attending: Internal Medicine | Admitting: Internal Medicine

## 2015-11-16 DIAGNOSIS — Z1231 Encounter for screening mammogram for malignant neoplasm of breast: Secondary | ICD-10-CM | POA: Diagnosis not present

## 2015-11-16 DIAGNOSIS — Z1239 Encounter for other screening for malignant neoplasm of breast: Secondary | ICD-10-CM

## 2015-11-21 ENCOUNTER — Ambulatory Visit (INDEPENDENT_AMBULATORY_CARE_PROVIDER_SITE_OTHER): Payer: Medicare Other | Admitting: Cardiology

## 2015-11-21 ENCOUNTER — Encounter (INDEPENDENT_AMBULATORY_CARE_PROVIDER_SITE_OTHER): Payer: Self-pay

## 2015-11-21 ENCOUNTER — Encounter: Payer: Self-pay | Admitting: Cardiology

## 2015-11-21 VITALS — BP 140/78 | HR 72 | Ht 66.0 in | Wt 162.2 lb

## 2015-11-21 DIAGNOSIS — I10 Essential (primary) hypertension: Secondary | ICD-10-CM

## 2015-11-21 DIAGNOSIS — R0602 Shortness of breath: Secondary | ICD-10-CM | POA: Diagnosis not present

## 2015-11-21 DIAGNOSIS — R011 Cardiac murmur, unspecified: Secondary | ICD-10-CM | POA: Diagnosis not present

## 2015-11-21 NOTE — Patient Instructions (Addendum)
Medication Instructions:  Your physician recommends that you continue on your current medications as directed. Please refer to the Current Medication list given to you today.   Labwork: None ordered  Testing/Procedures: Your physician has requested that you have an echocardiogram. Echocardiography is a painless test that uses sound waves to create images of your heart. It provides your doctor with information about the size and shape of your heart and how well your heart's chambers and valves are working. This procedure takes approximately one hour. There are no restrictions for this procedure.  Date & Time:_________________________________________________________  Follow-Up: Your physician recommends that you schedule a follow-up appointment in: 1 month with Dr. [name]  Date & Time: __________________________________________________________   Any Other Special Instructions Will Be Listed Below (If Applicable).     If you need a refill on your cardiac medications before your next appointment, please call your pharmacy.  Echocardiogram An echocardiogram, or echocardiography, uses sound waves (ultrasound) to produce an image of your heart. The echocardiogram is simple, painless, obtained within a short period of time, and offers valuable information to your health care provider. The images from an echocardiogram can provide information such as:  Evidence of coronary artery disease (CAD).  Heart size.  Heart muscle function.  Heart valve function.  Aneurysm detection.  Evidence of a past heart attack.  Fluid buildup around the heart.  Heart muscle thickening.  Assess heart valve function. LET [facility] CARE PROVIDER KNOW ABOUT:  Any allergies you have.  All medicines you are taking, including vitamins, herbs, eye drops, creams, and over-the-counter medicines.  Previous problems you or members of your family have had with the use of anesthetics.  Any blood disorders  you have.  Previous surgeries you have had.  Medical conditions you have.  Possibility of pregnancy, if this applies. BEFORE THE PROCEDURE  No special preparation is needed. Eat and drink normally.  PROCEDURE   In order to produce an image of your heart, gel will be applied to your chest and a wand-like tool (transducer) will be moved over your chest. The gel will help transmit the sound waves from the transducer. The sound waves will harmlessly bounce off your heart to allow the heart images to be captured in real-time motion. These images will then be recorded.  You may need an IV to receive a medicine that improves the quality of the pictures. AFTER THE PROCEDURE You may return to your normal schedule including diet, activities, and medicines, unless your health care provider tells you otherwise.   This information is not intended to replace advice given to you by your health care provider. Make sure you discuss any questions you have with your health care provider.   Document Released: 07/13/2000 Document Revised: 08/06/2014 Document Reviewed: 03/23/2013 Elsevier Interactive Patient Education [employer].

## 2015-11-21 NOTE — Progress Notes (Signed)
Cardiology Office Note   Date:  11/21/2015   ID:  Kathryn Wise, DOB February 02, 1930, MRN [id number]  Referring Doctor:  [name], MD   Cardiologist:   Wende Bushy, MD   Reason for consultation:  Chief Complaint  Patient presents with  . other    Ref by Dr. Derrel Nip for history of heart murmur and shortness of breath. Meds reviewed by the patient verbally. Pt. c/o shortness of breath with little exertion.       History of Present Illness: Kathryn Wise is a 82 y.o. female who presents for Shortness breath with exertion. Patient reports that she started walking on tracts/trails recently and experienced significant shortness breath. She also noted tightness in the upper chest. This was moderate to severe intensity, lasting minutes at a time, resolved with rest. Symptoms are mainly in the chest and nonradiating. Otherwise, patient has been physically active and doing water aerobics for a while now. When she does these exercises, she does not expense and chest pain or shortness of breath. No loss of consciousness. No palpitations.  In terms of heart murmur, she has been told in the past that she has a heart murmur. She does not know exactly what this is from.  Otherwise, patient does not report headache, fever, cough, colds, abdominal pain, orthopnea, PND, edema.   ROS:  Please see the history of present illness. Aside from mentioned under HPI, all other systems are reviewed and negative.     Past Medical History  Diagnosis Date  . Hypothyroidism   . Heart murmur     has had years and years  . Arthritis   . No pertinent past medical history     HX BRONCHITIS   . Osteoarthritis of left shoulder 07/14/2012  . History of shingles Dec 2013    treated with steroids , post op from shoulder surgery    Past Surgical History  Procedure Laterality Date  . Foot surgery      rt foot   TUMOR REMOVED  . Arthoscopic rotaor cuff repair      LEFT  10+  YEARS    . Cataract extraction  w/ intraocular lens implant      RIGHT EYE  . Skin cancer excision    . Facelift    . Total shoulder arthroplasty  07/14/2012    Procedure: TOTAL SHOULDER ARTHROPLASTY;  Surgeon: Johnny Bridge, MD;  Location: [facility];  Service: Orthopedics;  Laterality: Left;  . Joint replacement Left Dec 2013    shoulder     reports that she quit smoking about 43 years ago. Her smoking use included Cigarettes. She does not have any smokeless tobacco history on file. She reports that she drinks alcohol. She reports that she does not use illicit drugs.   family history includes Breast cancer (age of onset: 91) in her daughter; Cancer in her daughter and son.   Current Outpatient Prescriptions  Medication Sig Dispense Refill  . Cyanocobalamin (VITAMIN B 12) 100 MCG LOZG Take by mouth daily.    [name] levothyroxine (SYNTHROID, LEVOTHROID) 88 MCG tablet Take 88 mcg by mouth daily before breakfast.    . lisinopril (PRINIVIL,ZESTRIL) 10 MG tablet TAKE ONE TABLET BY MOUTH ONCE DAILY. 90 tablet 2  . LOTEMAX 0.5 % ophthalmic suspension     . Multiple Vitamin (MULTIVITAMIN WITH MINERALS) TABS Take 1 tablet by mouth daily.    [name] omeprazole (PRILOSEC) 20 MG capsule TAKE 1 CAPSULE BY MOUTH EVERY MORNING, PRIOR  TO BREAKFAST. 90 capsule 1  . RESTASIS 0.05 % ophthalmic emulsion      No current facility-administered medications for this visit.    Allergies: Alendronate and Oxycontin    PHYSICAL EXAM: VS:  BP 140/78 mmHg  Pulse 72  Ht 5\' 6"  (1.676 m)  Wt 162 lb 4 oz (73.596 kg)  BMI 26.20 kg/m2 , Body mass index is 26.2 kg/(m^2). Wt Readings from Last 3 Encounters:  11/21/15 162 lb 4 oz (73.596 kg)  11/02/15 163 lb (73.936 kg)  04/28/15 156 lb 4 oz (70.875 kg)    GENERAL:  well developed, well nourished,  not in acute distress HEENT: normocephalic, pink conjunctivae, anicteric sclerae, no xanthelasma, normal dentition, oropharynx clear NECK:  no neck vein engorgement, JVP normal, no hepatojugular reflux, carotid  upstroke brisk and symmetric, no bruit, no thyromegaly, no lymphadenopathy LUNGS:  good respiratory effort, clear to auscultation bilaterally CV:  PMI not displaced, no thrills, no lifts, S1 and S2 within normal limits, no palpable S3 or S4, 3/6 systolic ejection murmur, radiating to the neck, no rubs, no gallops ABD:  Soft, nontender, nondistended, normoactive bowel sounds, no abdominal aortic bruit, no hepatomegaly, no splenomegaly MS: nontender back, no kyphosis, no scoliosis, no joint deformities EXT:  2+ DP/PT pulses, no edema, no varicosities, no cyanosis, no clubbing SKIN: warm, nondiaphoretic, normal turgor, no ulcers NEUROPSYCH: alert, oriented to person, place, and time, sensory/motor grossly intact, normal mood, appropriate affect  Recent Labs: 10/31/2015: ALT 15; BUN 16; Creatinine, Ser 1.04; Hemoglobin 13.3; Platelets 197.0; Potassium 4.7; Sodium 138; TSH 8.53*   Lipid Panel    Component Value Date/Time   CHOL 203* 10/31/2015 0818   TRIG 126.0 10/31/2015 0818   HDL 48.30 10/31/2015 0818   CHOLHDL 4 10/31/2015 0818   VLDL 25.2 10/31/2015 0818   LDLCALC 129* 10/31/2015 0818   LDLDIRECT 209.0 09/10/2013 1404     Other studies Reviewed:  EKG:  The ekg from 11/21/2015 was personally reviewed by me and it revealed sinus rhythm, 72 BPM, ST depression and T wave inversion in lateral leads.  Additional studies/ records that were reviewed personally reviewed by me today include: None available as he already granulated   ASSESSMENT AND PLAN:  Shortness of breath with exertion Abnormal EKG Risk factors for coronary artery disease include age/postmenopausal state, hypertension, hyperlipidemia, smoking history. Patient also has a systolic ejection murmur. If echo does not reveal significant AS, we'll proceed with exercise nuclear stress test, modified Bruce protocol.  Systolic ejection murmur Recommend echocardiogram.  Hypertension BP is well controlled. Continue monitoring  BP. Continue current medical therapy and lifestyle changes. Recommend aspirin 81 mg by mouth daily.  Current medicines are reviewed at length with the patient today.  The patient does not have concerns regarding medicines.  Labs/ tests ordered today include:  Orders Placed This Encounter  Procedures  . EKG 12-Lead    I had a lengthy and detailed discussion with the patient regarding diagnoses, prognosis, diagnostic options, treatment options , and side effects of medications.   I counseled the patient on importance of lifestyle modification including heart healthy diet, regular physical activity.   Disposition:   FU with undersignedIn one month  Signed, Wende Bushy, MD  11/21/2015 3:26 PM    [place]

## 2015-12-05 ENCOUNTER — Other Ambulatory Visit: Payer: Self-pay

## 2015-12-05 ENCOUNTER — Ambulatory Visit (INDEPENDENT_AMBULATORY_CARE_PROVIDER_SITE_OTHER): Payer: Medicare Other

## 2015-12-05 DIAGNOSIS — I10 Essential (primary) hypertension: Secondary | ICD-10-CM

## 2015-12-05 DIAGNOSIS — R011 Cardiac murmur, unspecified: Secondary | ICD-10-CM

## 2015-12-05 DIAGNOSIS — R0602 Shortness of breath: Secondary | ICD-10-CM

## 2015-12-08 ENCOUNTER — Ambulatory Visit (INDEPENDENT_AMBULATORY_CARE_PROVIDER_SITE_OTHER): Payer: Medicare Other | Admitting: Internal Medicine

## 2015-12-08 ENCOUNTER — Encounter: Payer: Self-pay | Admitting: Internal Medicine

## 2015-12-08 ENCOUNTER — Telehealth: Payer: Self-pay | Admitting: *Deleted

## 2015-12-08 VITALS — BP 144/76 | HR 73 | Temp 98.1°F | Resp 12 | Ht 66.0 in | Wt 158.8 lb

## 2015-12-08 DIAGNOSIS — E038 Other specified hypothyroidism: Secondary | ICD-10-CM

## 2015-12-08 DIAGNOSIS — M810 Age-related osteoporosis without current pathological fracture: Secondary | ICD-10-CM | POA: Diagnosis not present

## 2015-12-08 DIAGNOSIS — R7301 Impaired fasting glucose: Secondary | ICD-10-CM

## 2015-12-08 DIAGNOSIS — M8580 Other specified disorders of bone density and structure, unspecified site: Secondary | ICD-10-CM

## 2015-12-08 DIAGNOSIS — F320 Major depressive disorder, single episode, mild: Secondary | ICD-10-CM

## 2015-12-08 DIAGNOSIS — R0602 Shortness of breath: Secondary | ICD-10-CM | POA: Diagnosis not present

## 2015-12-08 DIAGNOSIS — E034 Atrophy of thyroid (acquired): Secondary | ICD-10-CM | POA: Diagnosis not present

## 2015-12-08 LAB — HEMOGLOBIN A1C: Hgb A1c MFr Bld: 6.1 % (ref 4.6–6.5)

## 2015-12-08 MED ORDER — RALOXIFENE HCL 60 MG PO TABS: 60.0000 mg | ORAL_TABLET | Freq: Every day | ORAL | Status: DC

## 2015-12-08 MED ORDER — ALPRAZOLAM 0.25 MG PO TABS: 0.2500 mg | ORAL_TABLET | Freq: Every evening | ORAL | Status: DC | PRN

## 2015-12-08 NOTE — Progress Notes (Signed)
Subjective:  Patient ID: Kathryn Wise, female    DOB: [birth year]  Age: 82 y.o. MRN: [id number]  CC: The primary encounter diagnosis was Impaired fasting glucose. Diagnoses of Hypothyroidism due to acquired atrophy of thyroid, Osteoporosis, SOB (shortness of breath), Osteopenia, and Major depressive disorder, single episode, mild ([place]) were also pertinent to this visit.  HPI Kathryn Wise presents for follow up on multiple issues raised at her wellness exam last month.   1) new onset depression.  Brought on by recent death of son last year.   She did  not tolerate trial of lexapro due to persistent  dizziness lasting 2 days despite starting with 5 mg dose for the first 2 weeks.  Does not want to try an alternative.  Stressors include legal matters raised by deceased son's wife regarding the distribution of his estate.    2) systolic murmur (long standing, prior workup deferred by patient), with new onset eexrtional dyspnea and chest pain.  She underwent cardiology eval with a 2D ECHO.  The results were discussed with her today .  :  Normal EF . Aortic sclerosis without stenosis .  Still having episodes of chest pain with climbing hills  But not with other exercise.  Has follow up appt on 16th with Dr Loel Dubonnet for ETT    3) Osteoporosis:  Has been taking Evista since March 2015,  Needs follow up DEXA   4) Hypothyroid:  Has been taking alternating doses of 75 and 88 mcg levothyroxine for the past 5 weeks since TSH was > 8 .  Hairdresser still noting hair loss . Wants to recheck it today.    Outpatient Prescriptions Prior to Visit  Medication Sig Dispense Refill  . Cyanocobalamin (VITAMIN B 12) 100 MCG LOZG Take by mouth daily.    [name] levothyroxine (SYNTHROID, LEVOTHROID) 88 MCG tablet Take 88 mcg by mouth daily before breakfast.    . lisinopril (PRINIVIL,ZESTRIL) 10 MG tablet TAKE ONE TABLET BY MOUTH ONCE DAILY. 90 tablet 2  . LOTEMAX 0.5 % ophthalmic suspension     . Multiple Vitamin  (MULTIVITAMIN WITH MINERALS) TABS Take 1 tablet by mouth daily.    [name] omeprazole (PRILOSEC) 20 MG capsule TAKE 1 CAPSULE BY MOUTH EVERY MORNING, PRIOR TO BREAKFAST. 90 capsule 1  . RESTASIS 0.05 % ophthalmic emulsion      No facility-administered medications prior to visit.    Review of Systems;  Patient denies headache, fevers, malaise, unintentional weight loss, skin rash, eye pain, sinus congestion and sinus pain, sore throat, dysphagia,  hemoptysis , cough, dyspnea, wheezing, chest pain, palpitations, orthopnea, edema, abdominal pain, nausea, melena, diarrhea, constipation, flank pain, dysuria, hematuria, urinary  Frequency, nocturia, numbness, tingling, seizures,  Focal weakness, Loss of consciousness,  Tremor, insomnia, depression, anxiety, and suicidal ideation.      Objective:  BP 144/76 mmHg  Pulse 73  Temp(Src) 98.1 F (36.7 C) (Oral)  Resp 12  Ht 5\' 6"  (1.676 m)  Wt 158 lb 12 oz (72.009 kg)  BMI 25.64 kg/m2  SpO2 96%  BP Readings from Last 3 Encounters:  12/08/15 144/76  11/21/15 140/78  11/02/15 148/76    Wt Readings from Last 3 Encounters:  12/08/15 158 lb 12 oz (72.009 kg)  11/21/15 162 lb 4 oz (73.596 kg)  11/02/15 163 lb (73.936 kg)    General appearance: alert, cooperative and appears stated age Ears: normal TM's and external ear canals both ears Throat: lips, mucosa, and tongue normal; teeth and gums normal  Neck: no adenopathy, no carotid bruit, supple, symmetrical, trachea midline and thyroid not enlarged, symmetric, no tenderness/mass/nodules Back: symmetric, no curvature. ROM normal. No CVA tenderness. Lungs: clear to auscultation bilaterally Heart: regular rate and rhythm, S1, S2 normal, Grade II systolic murmur Abdomen: soft, non-tender; bowel sounds normal; no masses,  no organomegaly Pulses: 2+ and symmetric Skin: Skin color, texture, turgor normal. No rashes or lesions Lymph nodes: Cervical, supraclavicular, and axillary nodes normal.  Lab  Results  Component Value Date   HGBA1C 6.1 12/08/2015    Lab Results  Component Value Date   CREATININE 1.04 10/31/2015   CREATININE 0.93 04/28/2015   CREATININE 0.93 10/08/2014    Lab Results  Component Value Date   WBC 8.4 10/31/2015   HGB 13.3 10/31/2015   HCT 40.2 10/31/2015   PLT 197.0 10/31/2015   GLUCOSE 104* 10/31/2015   CHOL 203* 10/31/2015   TRIG 126.0 10/31/2015   HDL 48.30 10/31/2015   LDLDIRECT 209.0 09/10/2013   LDLCALC 129* 10/31/2015   ALT 15 10/31/2015   AST 21 10/31/2015   NA 138 10/31/2015   K 4.7 10/31/2015   CL 102 10/31/2015   CREATININE 1.04 10/31/2015   BUN 16 10/31/2015   CO2 31 10/31/2015   TSH 4.950* 12/08/2015   INR 0.98 07/09/2012   HGBA1C 6.1 12/08/2015    Mm Screening Breast Tomo Bilateral  11/16/2015  CLINICAL DATA:  Screening. EXAM: 2D DIGITAL SCREENING BILATERAL MAMMOGRAM WITH CAD AND ADJUNCT TOMO COMPARISON:  Previous exam(s). ACR Breast Density Category b: There are scattered areas of fibroglandular density. FINDINGS: There are no findings suspicious for malignancy. Images were processed with CAD. IMPRESSION: No mammographic evidence of malignancy. A result letter of this screening mammogram will be mailed directly to the patient. RECOMMENDATION: Screening mammogram in one year. (Code:SM-B-01Y) BI-RADS CATEGORY  1: Negative. Electronically Signed   By: Ammie Ferrier M.D.   On: 11/16/2015 16:28    Assessment & Plan:   Problem List Items Addressed This Visit    Osteopenia    Repeat DEXA ordered. Continue Evista.       Hypothyroidism    TSh still > 4.5 .  Increase dose to 88 mcg daily       Relevant Orders   T4 AND TSH (Completed)   Major depressive disorder, single episode    Patient has declined therapy after not tolerating a trial of Lexapro.        Relevant Medications   ALPRAZolam (XANAX) 0.25 MG tablet   SOB (shortness of breath)    No aortic stenosis or systolic/diastolic dysfunction noted on ECHO.  Not anemic .   For stress testing TBS by cardiology  Lab Results  Component Value Date   WBC 8.4 10/31/2015   HGB 13.3 10/31/2015   HCT 40.2 10/31/2015   MCV 88.2 10/31/2015   PLT 197.0 10/31/2015           Other Visit Diagnoses    Impaired fasting glucose    -  Primary    Relevant Orders    Hemoglobin A1c (Completed)    Osteoporosis        Relevant Medications    raloxifene (EVISTA) 60 MG tablet    Other Relevant Orders    DG Bone Density       I am having Ms. Furnas start on ALPRAZolam. I am also having her maintain her multivitamin with minerals, omeprazole, lisinopril, RESTASIS, LOTEMAX, levothyroxine, Vitamin B 12, and raloxifene.  Meds ordered this encounter  Medications  .  raloxifene (EVISTA) 60 MG tablet    Sig: Take 1 tablet (60 mg total) by mouth daily.    Dispense:  30 tablet    Refill:  5  . ALPRAZolam (XANAX) 0.25 MG tablet    Sig: Take 1 tablet (0.25 mg total) by mouth at bedtime as needed for anxiety.    Dispense:  30 tablet    Refill:  1    Medications Discontinued During This Encounter  Medication Reason  . raloxifene (EVISTA) 60 MG tablet Reorder    Follow-up: Return in about 6 months (around 06/09/2016).   [name], MD

## 2015-12-08 NOTE — Telephone Encounter (Signed)
[facility] stated that pt stated that she was to have a Rx sent over for sleep, the pharmacy did not receive the Rx Please advise

## 2015-12-08 NOTE — Patient Instructions (Addendum)
Your ECHO did NOT show any aortic stenosis or pump failure  You still need to see cardiology to have a stress test   Trial of low dose alprazolam to use IF NEEDED for insomnia

## 2015-12-08 NOTE — Progress Notes (Signed)
Pre-visit discussion using our clinic review tool. No additional management support is needed unless otherwise documented below in the visit note.  

## 2015-12-08 NOTE — Telephone Encounter (Signed)
Yes 30 minutes ago

## 2015-12-08 NOTE — Telephone Encounter (Signed)
Has the xanax prescription been faxed yet?

## 2015-12-10 LAB — T4 AND TSH
T4 TOTAL: 7.2 ug/dL (ref 4.5–12.0)
TSH: 4.95 u[IU]/mL — ABNORMAL HIGH (ref 0.450–4.500)

## 2015-12-10 NOTE — Assessment & Plan Note (Addendum)
No aortic stenosis or systolic/diastolic dysfunction noted on ECHO.  Not anemic .  For stress testing TBS by cardiology  Lab Results  Component Value Date   WBC 8.4 10/31/2015   HGB 13.3 10/31/2015   HCT 40.2 10/31/2015   MCV 88.2 10/31/2015   PLT 197.0 10/31/2015

## 2015-12-10 NOTE — Assessment & Plan Note (Signed)
Repeat DEXA ordered. Continue Evista.

## 2015-12-10 NOTE — Assessment & Plan Note (Signed)
Patient has declined therapy after not tolerating a trial of Lexapro.

## 2015-12-10 NOTE — Assessment & Plan Note (Signed)
TSh still > 4.5 .  Increase dose to 88 mcg daily

## 2015-12-12 NOTE — Addendum Note (Signed)
Addended by: [name] on: 12/12/2015 12:26 PM   Modules accepted: Orders

## 2015-12-13 ENCOUNTER — Encounter: Payer: Self-pay | Admitting: Cardiology

## 2015-12-13 ENCOUNTER — Ambulatory Visit (INDEPENDENT_AMBULATORY_CARE_PROVIDER_SITE_OTHER): Payer: Medicare Other | Admitting: Cardiology

## 2015-12-13 VITALS — BP 130/60 | HR 67 | Ht 66.0 in | Wt 159.4 lb

## 2015-12-13 DIAGNOSIS — R011 Cardiac murmur, unspecified: Secondary | ICD-10-CM

## 2015-12-13 DIAGNOSIS — R0602 Shortness of breath: Secondary | ICD-10-CM | POA: Diagnosis not present

## 2015-12-13 DIAGNOSIS — I10 Essential (primary) hypertension: Secondary | ICD-10-CM | POA: Diagnosis not present

## 2015-12-13 NOTE — Patient Instructions (Signed)
Medication Instructions:  Your physician recommends that you continue on your current medications as directed. Please refer to the Current Medication list given to you today.   Labwork: None ordered  Testing/Procedures: None ordered  Follow-Up: Your physician recommends that you schedule a follow-up appointment in: 3 months with Dr. [name].  Date & Time: __________________________________________________________   Any Other Special Instructions Will Be Listed Below (If Applicable).     If you need a refill on your cardiac medications before your next appointment, please call your pharmacy.

## 2015-12-13 NOTE — Progress Notes (Signed)
Cardiology Office Note   Date:  12/13/2015   ID:  Kathryn Wise, DOB March 25, 1930, MRN [id number]  Referring Doctor:  [name], MD   Cardiologist:   Wende Bushy, MD   Reason for consultation:  Chief Complaint  Patient presents with  . Follow-up    f/u after echo      History of Present Illness: Kathryn Wise is a 82 y.o. female who presents for   In terms of heart murmur, she has been told in the past that she has a heart murmur. She does not know exactly what this is from.  Patient has been doing overall well. Shortness of breath is improved. She thinks that she may have been over doing stuff and taking for granted that she is 82 years old. She talks about not being able to accept the fact that she may not be able to do the things that she could do 10 years ago.  Otherwise, patient does not report headache, fever, cough, colds, abdominal pain, orthopnea, PND, edema.   ROS:  Please see the history of present illness. Aside from mentioned under HPI, all other systems are reviewed and negative.     Past Medical History  Diagnosis Date  . Hypothyroidism   . Heart murmur     has had years and years  . Arthritis   . No pertinent past medical history     HX BRONCHITIS   . Osteoarthritis of left shoulder 07/14/2012  . History of shingles Dec 2013    treated with steroids , post op from shoulder surgery    Past Surgical History  Procedure Laterality Date  . Foot surgery      rt foot   TUMOR REMOVED  . Arthoscopic rotaor cuff repair      LEFT  10+  YEARS    . Cataract extraction w/ intraocular lens implant      RIGHT EYE  . Skin cancer excision    . Facelift    . Total shoulder arthroplasty  07/14/2012    Procedure: TOTAL SHOULDER ARTHROPLASTY;  Surgeon: Johnny Bridge, MD;  Location: [facility];  Service: Orthopedics;  Laterality: Left;  . Joint replacement Left Dec 2013    shoulder     reports that she quit smoking about 43 years ago. Her smoking use  included Cigarettes. She does not have any smokeless tobacco history on file. She reports that she drinks alcohol. She reports that she does not use illicit drugs.   family history includes Breast cancer (age of onset: 27) in her daughter; Cancer in her daughter and son.   Current Outpatient Prescriptions  Medication Sig Dispense Refill  . ALPRAZolam (XANAX) 0.25 MG tablet Take 1 tablet (0.25 mg total) by mouth at bedtime as needed for anxiety. 30 tablet 1  . Cyanocobalamin (VITAMIN B 12) 100 MCG LOZG Take by mouth daily.    [name] levothyroxine (SYNTHROID, LEVOTHROID) 88 MCG tablet Take 88 mcg by mouth daily before breakfast.    . lisinopril (PRINIVIL,ZESTRIL) 10 MG tablet TAKE ONE TABLET BY MOUTH ONCE DAILY. 90 tablet 2  . Multiple Vitamin (MULTIVITAMIN WITH MINERALS) TABS Take 1 tablet by mouth daily.    [name] omeprazole (PRILOSEC) 20 MG capsule TAKE 1 CAPSULE BY MOUTH EVERY MORNING, PRIOR TO BREAKFAST. 90 capsule 1  . raloxifene (EVISTA) 60 MG tablet Take 1 tablet (60 mg total) by mouth daily. 30 tablet 5  . RESTASIS 0.05 % ophthalmic emulsion  No current facility-administered medications for this visit.    Allergies: Alendronate and Oxycontin    PHYSICAL EXAM: VS:  BP 130/60 mmHg  Pulse 67  Ht 5\' 6"  (1.676 m)  Wt 159 lb 6.4 oz (72.303 kg)  BMI 25.74 kg/m2  SpO2 96% , Body mass index is 25.74 kg/(m^2). Wt Readings from Last 3 Encounters:  12/13/15 159 lb 6.4 oz (72.303 kg)  12/08/15 158 lb 12 oz (72.009 kg)  11/21/15 162 lb 4 oz (73.596 kg)    GENERAL:  well developed, well nourished,  not in acute distress HEENT: normocephalic, pink conjunctivae, anicteric sclerae, no xanthelasma, normal dentition, oropharynx clear NECK:  no neck vein engorgement, JVP normal, no hepatojugular reflux, carotid upstroke brisk and symmetric, no bruit, no thyromegaly, no lymphadenopathy LUNGS:  good respiratory effort, clear to auscultation bilaterally CV:  PMI not displaced, no thrills, no lifts, S1  and S2 within normal limits, no palpable S3 or S4, 3/6 systolic ejection murmur, radiating to the neck, no rubs, no gallops ABD:  Soft, nontender, nondistended, normoactive bowel sounds, no abdominal aortic bruit, no hepatomegaly, no splenomegaly MS: nontender back, no kyphosis, no scoliosis, no joint deformities EXT:  2+ DP/PT pulses, no edema, no varicosities, no cyanosis, no clubbing SKIN: warm, nondiaphoretic, normal turgor, no ulcers NEUROPSYCH: alert, oriented to person, place, and time, sensory/motor grossly intact, normal mood, appropriate affect  Recent Labs: 10/31/2015: ALT 15; BUN 16; Creatinine, Ser 1.04; Hemoglobin 13.3; Platelets 197.0; Potassium 4.7; Sodium 138 12/08/2015: TSH 4.950*   Lipid Panel    Component Value Date/Time   CHOL 203* 10/31/2015 0818   TRIG 126.0 10/31/2015 0818   HDL 48.30 10/31/2015 0818   CHOLHDL 4 10/31/2015 0818   VLDL 25.2 10/31/2015 0818   LDLCALC 129* 10/31/2015 0818   LDLDIRECT 209.0 09/10/2013 1404     Other studies Reviewed:  EKG:  The ekg from 11/21/2015 was personally reviewed by me and it revealed sinus rhythm, 72 BPM, ST depression and T wave inversion in lateral leads.  Additional studies/ records that were reviewed personally reviewed by me today include: Echo 12/05/2015: Left ventricle: The cavity size was normal. There was mild focal  basal and mild concentric hypertrophy of the septum, with mild  LVOT gradient. Systolic function was normal. The estimated  ejection fraction was in the range of 60% to 65%. Wall motion was  normal; there were no regional wall motion abnormalities. Doppler  parameters are consistent with abnormal left ventricular  relaxation (grade 1 diastolic dysfunction). - Aorta: Aortic root with milldy dilated, dimension: 35 mm (ED).  Ascending aorta was mildly dilated, diameter: 35 mm (S). - Mitral valve: There was mild regurgitation. - Left atrium: The atrium was normal in size. - Right ventricle:  Systolic function was normal. - Pulmonary arteries: Systolic pressure was within the normal  range. - Inferior vena cava: The vessel was normal in size. The  respirophasic diameter changes were in the normal range (>= 50%),  consistent with normal central venous pressure.  Impressions:  - Murmur likely secondary to aortic valve sclerosis without  significant stenosis, and mild LVOT gradient.   ASSESSMENT AND PLAN:  Shortness of breath with exertion Abnormal EKG Risk factors for coronary artery disease include age/postmenopausal state, hypertension, hyperlipidemia, smoking history. No aortic stenosis on echocardiogram. Recommended proceeding with stress testing if she continues to have issues with shortness of breath. Patient verbalized understanding and will inform her office about scheduling the test.   Systolic ejection murmur Aortic sclerosis noted on  echocardiogram. No evidence of stenosis. Discuss findings with patient.   Hypertension BP is well controlled. Continue monitoring BP. Continue current medical therapy and lifestyle changes. Recommend aspirin 81 mg by mouth daily.  Current medicines are reviewed at length with the patient today.  The patient does not have concerns regarding medicines.  Labs/ tests ordered today include:  No orders of the defined types were placed in this encounter.    I had a lengthy and detailed discussion with the patient regarding diagnoses, prognosis, diagnostic options, treatment options , and side effects of medications.   I counseled the patient on importance of lifestyle modification including heart healthy diet, regular physical activity.   Disposition:   FU with undersignedIn3 months   I spent at least 25 minutes with the patient today and more than 50% of the time was spent counseling the patient and coordinating care.      Signed, Wende Bushy, MD  12/13/2015 2:42 PM    [facility]

## 2016-01-02 DIAGNOSIS — Z9841 Cataract extraction status, right eye: Secondary | ICD-10-CM | POA: Diagnosis not present

## 2016-01-16 ENCOUNTER — Other Ambulatory Visit: Payer: Self-pay | Admitting: Internal Medicine

## 2016-01-17 ENCOUNTER — Other Ambulatory Visit (INDEPENDENT_AMBULATORY_CARE_PROVIDER_SITE_OTHER): Payer: Medicare Other

## 2016-01-17 DIAGNOSIS — E034 Atrophy of thyroid (acquired): Secondary | ICD-10-CM | POA: Diagnosis not present

## 2016-01-17 DIAGNOSIS — E038 Other specified hypothyroidism: Secondary | ICD-10-CM | POA: Diagnosis not present

## 2016-01-18 LAB — T4 AND TSH
T4 TOTAL: 7.1 ug/dL (ref 4.5–12.0)
TSH: 3.38 u[IU]/mL (ref 0.450–4.500)

## 2016-02-21 ENCOUNTER — Other Ambulatory Visit: Payer: Self-pay | Admitting: Internal Medicine

## 2016-02-21 NOTE — Telephone Encounter (Signed)
A historical medication but patients last TSh on 01/17/16 was WNL at 3.380. Patient was told to continue on the 45 MCG of levothyroxine. Please advise?

## 2016-02-21 NOTE — Telephone Encounter (Signed)
[employer]

## 2016-03-02 DIAGNOSIS — L298 Other pruritus: Secondary | ICD-10-CM | POA: Diagnosis not present

## 2016-03-13 DIAGNOSIS — H2512 Age-related nuclear cataract, left eye: Secondary | ICD-10-CM | POA: Diagnosis not present

## 2016-03-19 DIAGNOSIS — H2512 Age-related nuclear cataract, left eye: Secondary | ICD-10-CM | POA: Diagnosis not present

## 2016-03-20 ENCOUNTER — Encounter: Payer: Self-pay | Admitting: Cardiology

## 2016-03-20 ENCOUNTER — Ambulatory Visit (INDEPENDENT_AMBULATORY_CARE_PROVIDER_SITE_OTHER): Payer: Medicare Other | Admitting: Cardiology

## 2016-03-20 VITALS — BP 138/70 | HR 73 | Ht 67.0 in | Wt 159.5 lb

## 2016-03-20 DIAGNOSIS — I10 Essential (primary) hypertension: Secondary | ICD-10-CM | POA: Diagnosis not present

## 2016-03-20 DIAGNOSIS — R011 Cardiac murmur, unspecified: Secondary | ICD-10-CM

## 2016-03-20 NOTE — Progress Notes (Signed)
Cardiology Office Note   Date:  03/20/2016   ID:  JONES DELAPORTE, DOB Feb 01, 1930, MRN [id number]  Referring Doctor:  [name], MD   Cardiologist:   Wende Bushy, MD   Reason for consultation:  No chief complaint on file.     History of Present Illness: Kathryn Wise is a 82 y.o. female who presents for ffup  In terms of heart murmur, she has been told in the past that she has a heart murmur. She does not know exactly what this is from.  Since last visit, patient has been doing fairly well. No recurrence of shortness of breath or chest pain. She is continuing to do water aerobics at least 3 times a week, sometimes every day, and sometimes 2 sessions at a time.  Otherwise, patient does not report headache, fever, cough, colds, abdominal pain, orthopnea, PND, edema.   ROS:  Please see the history of present illness. Aside from mentioned under HPI, all other systems are reviewed and negative.     Past Medical History:  Diagnosis Date  . Arthritis   . Heart murmur    has had years and years  . History of shingles Dec 2013   treated with steroids , post op from shoulder surgery  . Hypothyroidism   . No pertinent past medical history    HX BRONCHITIS   . Osteoarthritis of left shoulder 07/14/2012    Past Surgical History:  Procedure Laterality Date  . ARTHOSCOPIC ROTAOR CUFF REPAIR     LEFT  10+  YEARS    . CATARACT EXTRACTION W/ INTRAOCULAR LENS IMPLANT     RIGHT EYE  . FACELIFT    . FOOT SURGERY     rt foot   TUMOR REMOVED  . JOINT REPLACEMENT Left Dec 2013   shoulder  . SKIN CANCER EXCISION    . TOTAL SHOULDER ARTHROPLASTY  07/14/2012   Procedure: TOTAL SHOULDER ARTHROPLASTY;  Surgeon: Johnny Bridge, MD;  Location: [facility];  Service: Orthopedics;  Laterality: Left;     reports that she quit smoking about 43 years ago. Her smoking use included Cigarettes. She does not have any smokeless tobacco history on file. She reports that she drinks alcohol. She  reports that she does not use drugs.   family history includes Breast cancer (age of onset: 36) in her daughter; Cancer in her daughter and son.   Current Outpatient Prescriptions  Medication Sig Dispense Refill  . ALPRAZolam (XANAX) 0.25 MG tablet Take 1 tablet (0.25 mg total) by mouth at bedtime as needed for anxiety. 30 tablet 1  . Cyanocobalamin (VITAMIN B 12) 100 MCG LOZG Take by mouth daily.    [name] levothyroxine (SYNTHROID, LEVOTHROID) 88 MCG tablet TAKE 1 TABLET BY MOUTH ONCE DAILY BEFOREBREAKFAST 90 tablet 1  . lisinopril (PRINIVIL,ZESTRIL) 10 MG tablet TAKE ONE TABLET BY MOUTH ONCE DAILY. 90 tablet 2  . Multiple Vitamin (MULTIVITAMIN WITH MINERALS) TABS Take 1 tablet by mouth daily.    [name] omeprazole (PRILOSEC) 20 MG capsule TAKE 1 CAPSULE BY MOUTH EVERY MORNING, PRIOR TO BREAKFAST. 90 capsule 1  . raloxifene (EVISTA) 60 MG tablet Take 1 tablet (60 mg total) by mouth daily. 30 tablet 5  . RESTASIS 0.05 % ophthalmic emulsion      No current facility-administered medications for this visit.     Allergies: Alendronate and Oxycontin [oxycodone hcl]    PHYSICAL EXAM: VS:  There were no vitals taken for this visit. , There is  no height or weight on file to calculate BMI. Wt Readings from Last 3 Encounters:  12/13/15 159 lb 6.4 oz (72.3 kg)  12/08/15 158 lb 12 oz (72 kg)  11/21/15 162 lb 4 oz (73.6 kg)    GENERAL:  well developed, well nourished,  not in acute distress HEENT: normocephalic, pink conjunctivae, anicteric sclerae, no xanthelasma, normal dentition, oropharynx clear NECK:  no neck vein engorgement, JVP normal, no hepatojugular reflux, carotid upstroke brisk and symmetric, no bruit, no thyromegaly, no lymphadenopathy LUNGS:  good respiratory effort, clear to auscultation bilaterally CV:  PMI not displaced, no thrills, no lifts, S1 and S2 within normal limits, no palpable S3 or S4, 3/6 systolic ejection murmur, radiating to the neck, no rubs, no gallops ABD:  Soft, nontender,  nondistended, normoactive bowel sounds, no abdominal aortic bruit, no hepatomegaly, no splenomegaly MS: nontender back, no kyphosis, no scoliosis, no joint deformities EXT:  2+ DP/PT pulses, no edema, no varicosities, no cyanosis, no clubbing SKIN: warm, nondiaphoretic, normal turgor, no ulcers NEUROPSYCH: alert, oriented to person, place, and time, sensory/motor grossly intact, normal mood, appropriate affect  Recent Labs: 10/31/2015: ALT 15; BUN 16; Creatinine, Ser 1.04; Hemoglobin 13.3; Platelets 197.0; Potassium 4.7; Sodium 138 01/17/2016: TSH 3.380   Lipid Panel    Component Value Date/Time   CHOL 203 (H) 10/31/2015 0818   TRIG 126.0 10/31/2015 0818   HDL 48.30 10/31/2015 0818   CHOLHDL 4 10/31/2015 0818   VLDL 25.2 10/31/2015 0818   LDLCALC 129 (H) 10/31/2015 0818   LDLDIRECT 209.0 09/10/2013 1404     Other studies Reviewed:  EKG:  The ekg from 11/21/2015 was personally reviewed by me and it revealed sinus rhythm, 72 BPM, ST depression and T wave inversion in lateral leads.  Additional studies/ records that were reviewed personally reviewed by me today include: Echo 12/05/2015: Left ventricle: The cavity size was normal. There was mild focal  basal and mild concentric hypertrophy of the septum, with mild  LVOT gradient. Systolic function was normal. The estimated  ejection fraction was in the range of 60% to 65%. Wall motion was  normal; there were no regional wall motion abnormalities. Doppler  parameters are consistent with abnormal left ventricular  relaxation (grade 1 diastolic dysfunction). - Aorta: Aortic root with milldy dilated, dimension: 35 mm (ED).  Ascending aorta was mildly dilated, diameter: 35 mm (S). - Mitral valve: There was mild regurgitation. - Left atrium: The atrium was normal in size. - Right ventricle: Systolic function was normal. - Pulmonary arteries: Systolic pressure was within the normal  range. - Inferior vena cava: The vessel was  normal in size. The  respirophasic diameter changes were in the normal range (>= 50%),  consistent with normal central venous pressure.  Impressions:  - Murmur likely secondary to aortic valve sclerosis without  significant stenosis, and mild LVOT gradient.   ASSESSMENT AND PLAN:  Shortness of breath with exertion Abnormal EKG Risk factors for coronary artery disease include age/postmenopausal state, hypertension, hyperlipidemia, smoking history. No aortic stenosis on echocardiogram.  Patient has had no recurrence of shortness of breath. Patient to inform office if she has anymore issues.   Systolic ejection murmur Aortic sclerosis noted on echocardiogram. No evidence of stenosis. Discuss findings with patient.   Hypertension BP is well controlled. Continue monitoring BP. Continue current medical therapy and lifestyle changes. Recommend aspirin 81 mg by mouth daily.  Current medicines are reviewed at length with the patient today.  The patient does not have concerns regarding  medicines.  Labs/ tests ordered today include:  No orders of the defined types were placed in this encounter.   I had a lengthy and detailed discussion with the patient regarding diagnoses, prognosis, diagnostic options, treatment options , and side effects of medications.   I counseled the patient on importance of lifestyle modification including heart healthy diet, regular physical activity.   Disposition:   FU with undersigned prn  I spent at least 25 minutes with the patient today and more than 50% of the time was spent counseling the patient and coordinating care.      Signed, Wende Bushy, MD  03/20/2016 2:01 PM    [facility]

## 2016-03-20 NOTE — Patient Instructions (Signed)
Follow-Up: Your physician recommends that you schedule a follow-up appointment as needed.   It was a pleasure seeing you today here in the office. Please do not hesitate to give us a call back if you have any further questions. [phone]  Del Overfelt A. RN, BSN    

## 2016-03-21 NOTE — Discharge Instructions (Signed)

## 2016-03-22 ENCOUNTER — Encounter: Payer: Self-pay | Admitting: *Deleted

## 2016-03-28 ENCOUNTER — Ambulatory Visit: Payer: Medicare Other | Admitting: Anesthesiology

## 2016-03-28 ENCOUNTER — Ambulatory Visit
Admission: RE | Admit: 2016-03-28 | Discharge: 2016-03-28 | Disposition: A | Discharge status: Home / Self Care | Payer: Medicare Other | Source: Ambulatory Visit | Attending: Ophthalmology | Admitting: Ophthalmology

## 2016-03-28 ENCOUNTER — Encounter
Admission: RE | Disposition: A | Discharge status: Home / Self Care | Payer: Self-pay | Source: Ambulatory Visit | Attending: Ophthalmology

## 2016-03-28 DIAGNOSIS — K219 Gastro-esophageal reflux disease without esophagitis: Secondary | ICD-10-CM | POA: Insufficient documentation

## 2016-03-28 DIAGNOSIS — Z87891 Personal history of nicotine dependence: Secondary | ICD-10-CM | POA: Diagnosis not present

## 2016-03-28 DIAGNOSIS — M1990 Unspecified osteoarthritis, unspecified site: Secondary | ICD-10-CM | POA: Insufficient documentation

## 2016-03-28 DIAGNOSIS — I10 Essential (primary) hypertension: Secondary | ICD-10-CM | POA: Insufficient documentation

## 2016-03-28 DIAGNOSIS — H2512 Age-related nuclear cataract, left eye: Secondary | ICD-10-CM | POA: Insufficient documentation

## 2016-03-28 DIAGNOSIS — M353 Polymyalgia rheumatica: Secondary | ICD-10-CM | POA: Insufficient documentation

## 2016-03-28 DIAGNOSIS — I350 Nonrheumatic aortic (valve) stenosis: Secondary | ICD-10-CM | POA: Diagnosis not present

## 2016-03-28 DIAGNOSIS — E039 Hypothyroidism, unspecified: Secondary | ICD-10-CM | POA: Diagnosis not present

## 2016-03-28 HISTORY — DX: Polymyalgia rheumatica: M35.3

## 2016-03-28 HISTORY — PX: CATARACT EXTRACTION W/PHACO: SHX586

## 2016-03-28 HISTORY — DX: Essential (primary) hypertension: I10

## 2016-03-28 HISTORY — DX: Gastro-esophageal reflux disease without esophagitis: K21.9

## 2016-03-28 SURGERY — PHACOEMULSIFICATION, CATARACT, WITH IOL INSERTION
Anesthesia: Monitor Anesthesia Care | Laterality: Left | Wound class: Clean

## 2016-03-28 MED ORDER — TIMOLOL MALEATE 0.5 % OP SOLN
OPHTHALMIC | Status: DC | PRN
  Administered 2016-03-28: 1 [drp] via OPHTHALMIC

## 2016-03-28 MED ORDER — ARMC OPHTHALMIC DILATING GEL
1.0000 "application " | OPHTHALMIC | Status: DC | PRN
  Administered 2016-03-28 (×2): 1 via OPHTHALMIC

## 2016-03-28 MED ORDER — FENTANYL CITRATE (PF) 100 MCG/2ML IJ SOLN
INTRAMUSCULAR | Status: DC | PRN
  Administered 2016-03-28: 50 ug via INTRAVENOUS

## 2016-03-28 MED ORDER — NA HYALUR & NA CHOND-NA HYALUR 0.4-0.35 ML IO KIT
PACK | INTRAOCULAR | Status: DC | PRN
  Administered 2016-03-28: 1 mL via INTRAOCULAR

## 2016-03-28 MED ORDER — LACTATED RINGERS IV SOLN: INTRAVENOUS | Status: DC

## 2016-03-28 MED ORDER — EPINEPHRINE HCL 1 MG/ML IJ SOLN
INTRAMUSCULAR | Status: DC | PRN
  Administered 2016-03-28: 68 mL via OPHTHALMIC

## 2016-03-28 MED ORDER — LIDOCAINE HCL (PF) 4 % IJ SOLN
INTRAMUSCULAR | Status: DC | PRN
  Administered 2016-03-28: 1 mL via OPHTHALMIC

## 2016-03-28 MED ORDER — TETRACAINE HCL 0.5 % OP SOLN
1.0000 [drp] | OPHTHALMIC | Status: DC | PRN
  Administered 2016-03-28: 1 [drp] via OPHTHALMIC

## 2016-03-28 MED ORDER — POVIDONE-IODINE 5 % OP SOLN
1.0000 "application " | OPHTHALMIC | Status: DC | PRN
  Administered 2016-03-28: 1 via OPHTHALMIC

## 2016-03-28 MED ORDER — MIDAZOLAM HCL 2 MG/2ML IJ SOLN
INTRAMUSCULAR | Status: DC | PRN
  Administered 2016-03-28: 2 mg via INTRAVENOUS

## 2016-03-28 MED ORDER — BRIMONIDINE TARTRATE 0.2 % OP SOLN
OPHTHALMIC | Status: DC | PRN
  Administered 2016-03-28: 1 [drp] via OPHTHALMIC

## 2016-03-28 MED ORDER — CEFUROXIME OPHTHALMIC INJECTION 1 MG/0.1 ML
INJECTION | OPHTHALMIC | Status: DC | PRN
  Administered 2016-03-28: 0.1 mL via OPHTHALMIC

## 2016-03-28 SURGICAL SUPPLY — 27 items
CANNULA ANT/CHMB 27GA (MISCELLANEOUS) ×3 IMPLANT
CARTRIDGE ABBOTT (MISCELLANEOUS) IMPLANT
GLOVE SURG LX 7.5 STRW (GLOVE) ×2
GLOVE SURG LX STRL 7.5 STRW (GLOVE) ×1 IMPLANT
GLOVE SURG TRIUMPH 8.0 PF LTX (GLOVE) ×3 IMPLANT
GOWN STRL REUS W/ TWL LRG LVL3 (GOWN DISPOSABLE) ×2 IMPLANT
GOWN STRL REUS W/TWL LRG LVL3 (GOWN DISPOSABLE) ×4
LENS IOL ACRSF IQ TRC 6 19.0 ×1 IMPLANT
LENS IOL ACRYSOF IQ TORIC 19.0 ×2 IMPLANT
LENS IOL IQ TORIC 6 19.0 ×1 IMPLANT
MARKER SKIN DUAL TIP RULER LAB (MISCELLANEOUS) ×3 IMPLANT
NDL RETROBULBAR .5 NSTRL (NEEDLE) IMPLANT
NEEDLE FILTER BLUNT 18X 1/2SAF (NEEDLE) ×2
NEEDLE FILTER BLUNT 18X1 1/2 (NEEDLE) ×1 IMPLANT
PACK CATARACT BRASINGTON (MISCELLANEOUS) ×3 IMPLANT
PACK EYE AFTER SURG (MISCELLANEOUS) ×3 IMPLANT
PACK OPTHALMIC (MISCELLANEOUS) ×3 IMPLANT
RING MALYGIN 7.0 (MISCELLANEOUS) IMPLANT
SUT ETHILON 10-0 CS-B-6CS-B-6 (SUTURE)
SUT VICRYL  9 0 (SUTURE)
SUT VICRYL 9 0 (SUTURE) IMPLANT
SUTURE EHLN 10-0 CS-B-6CS-B-6 (SUTURE) IMPLANT
SYR 3ML LL SCALE MARK (SYRINGE) ×3 IMPLANT
SYR 5ML LL (SYRINGE) ×3 IMPLANT
SYR TB 1ML LUER SLIP (SYRINGE) ×3 IMPLANT
WATER STERILE IRR 250ML POUR (IV SOLUTION) ×3 IMPLANT
WIPE NON LINTING 3.25X3.25 (MISCELLANEOUS) ×3 IMPLANT

## 2016-03-28 NOTE — Anesthesia Preprocedure Evaluation (Addendum)
Anesthesia Evaluation  Patient identified by MRN, date of birth, ID band  Reviewed: NPO status   History of Anesthesia Complications Negative for: history of anesthetic complications  Airway Mallampati: II  TM Distance: >3 FB Neck ROM: full    Dental  (+) Chipped   Pulmonary neg pulmonary ROS, former smoker,    Pulmonary exam normal        Cardiovascular Exercise Tolerance: Good hypertension, Normal cardiovascular exam+ Valvular Problems/Murmurs   echo: 11/2015:ef=60%; Murmur likely secondary to aortic valve sclerosis without   significant stenosis, and mild LVOT gradient.  ekg: 10/2015: nsr; ST depression and T wave inversion in lateral leads.;     Neuro/Psych PSYCHIATRIC DISORDERS Depression negative neurological ROS     GI/Hepatic Neg liver ROS, GERD  Controlled,  Endo/Other  Hypothyroidism   Renal/GU negative Renal ROS  negative genitourinary   Musculoskeletal  (+) Arthritis , Polymyalgia rheumatica    Abdominal   Peds  Hematology negative hematology ROS (+)   Anesthesia Other Findings cards stable: 11/2015: dr. [name]; med stable: 11/2015: dr. Derrel Nip;   Reproductive/Obstetrics                            Anesthesia Physical Anesthesia Plan  ASA: II  Anesthesia Plan: MAC   Post-op Pain Management:    Induction:   Airway Management Planned:   Additional Equipment:   Intra-op Plan:   Post-operative Plan:   Informed Consent: I have reviewed the patients History and Physical, chart, labs and discussed the procedure including the risks, benefits and alternatives for the proposed anesthesia with the patient or authorized representative who has indicated his/her understanding and acceptance.     Plan Discussed with: CRNA  Anesthesia Plan Comments:        Anesthesia Quick Evaluation

## 2016-03-28 NOTE — Transfer of Care (Signed)
Immediate Anesthesia Transfer of Care Note  Patient: Kathryn Wise  Procedure(s) Performed: Procedure(s) with comments: CATARACT EXTRACTION PHACO AND INTRAOCULAR LENS PLACEMENT ([place]) (Left) - TORIC  Patient Location: PACU  Anesthesia Type: MAC  Level of Consciousness: awake, alert  and patient cooperative  Airway and Oxygen Therapy: Patient Spontanous Breathing and Patient connected to supplemental oxygen  Post-op Assessment: Post-op Vital signs reviewed, Patient's Cardiovascular Status Stable, Respiratory Function Stable, Patent Airway and No signs of Nausea or vomiting  Post-op Vital Signs: Reviewed and stable  Complications: No apparent anesthesia complications

## 2016-03-28 NOTE — H&P (Signed)
  The History and Physical notes are on paper, have been signed, and are to be scanned. The patient remains stable and unchanged from the H&P.   Previous H&P reviewed, patient examined, and there are no changes.  Kathryn Wise 03/28/2016 8:57 AM

## 2016-03-28 NOTE — Anesthesia Procedure Notes (Signed)
Procedure Name: MAC Performed by: Austan Nicholl Pre-anesthesia Checklist: Patient identified, Emergency Drugs available, Suction available, Timeout performed and Patient being monitored Patient Re-evaluated:Patient Re-evaluated prior to inductionOxygen Delivery Method: Nasal cannula Placement Confirmation: positive ETCO2     

## 2016-03-28 NOTE — Anesthesia Postprocedure Evaluation (Signed)
Anesthesia Post Note  Patient: Kathryn Wise  Procedure(s) Performed: Procedure(s) (LRB): CATARACT EXTRACTION PHACO AND INTRAOCULAR LENS PLACEMENT ([facility]) (Left)  Patient location during evaluation: PACU Anesthesia Type: MAC Level of consciousness: awake and alert Pain management: pain level controlled Vital Signs Assessment: post-procedure vital signs reviewed and stable Respiratory status: spontaneous breathing, nonlabored ventilation, respiratory function stable and patient connected to nasal cannula oxygen Cardiovascular status: stable and blood pressure returned to baseline Anesthetic complications: no    [name]

## 2016-03-28 NOTE — Op Note (Signed)
LOCATION:  [place]   PREOPERATIVE DIAGNOSIS:  Nuclear sclerotic cataract of the left eye.  H25.12  POSTOPERATIVE DIAGNOSIS:  Nuclear sclerotic cataract of the left eye.   PROCEDURE:  Phacoemulsification with Toric posterior chamber intraocular lens placement of the left eye.   LENS:  Implant Name Type Inv. Item Serial No. Manufacturer Lot No. LRB No. Used  [id number] lens toric     [id number] ALCON   Left 1   Toric intraocular lens with 3.75 diopters of cylindrical power with axis orientation at 0 degrees.   ULTRASOUND TIME: 17 % of 1 minutes, 26 seconds.  CDE 15.2   SURGEON:  Wyonia Hough, MD   ANESTHESIA:  Topical with tetracaine drops and 2% Xylocaine jelly, augmented with 1% preservative-free intracameral lidocaine.  COMPLICATIONS:  None.   DESCRIPTION OF PROCEDURE:  The patient was identified in the holding room and transported to the operating suite and placed in the supine position under the operating microscope.  The left eye was identified as the operative eye, and it was prepped and draped in the usual sterile ophthalmic fashion.    A clear-corneal paracentesis incision was made at the 1:30 position.  0.5 ml of preservative-free 1% lidocaine was injected into the anterior chamber. The anterior chamber was filled with Viscoat.  A 2.4 millimeter near clear corneal incision was then made at the 10:30 position.  A cystotome and capsulorrhexis forceps were then used to make a curvilinear capsulorrhexis.  Hydrodissection and hydrodelineation were then performed using balanced salt solution.   Phacoemulsification was then used in stop and chop fashion to remove the lens, nucleus and epinucleus.  The remaining cortex was aspirated using the irrigation and aspiration handpiece.  Provisc viscoelastic was then placed into the capsular bag to distend it for lens placement.  The Verion digital marker was used to align the implant at the intended axis.   A 19.0  diopter lens was then injected into the capsular bag.  It was rotated clockwise until the axis marks on the lens were approximately 15 degrees in the counterclockwise direction to the intended alignment.  The viscoelastic was aspirated from the eye using the irrigation aspiration handpiece.  Then, a Koch spatula through the sideport incision was used to rotate the lens in a clockwise direction until the axis markings of the intraocular lens were lined up with the Verion alignment.  Balanced salt solution was then used to hydrate the wounds. Cefuroxime 0.1 ml of a 10mg /ml solution was injected into the anterior chamber for a dose of 1 mg of intracameral antibiotic at the completion of the case.    The eye was noted to have a physiologic pressure and there was no wound leak noted.   Timolol and Brimonidine drops were applied to the eye.  The patient was taken to the recovery room in stable condition having had no complications of anesthesia or surgery.  Kathryn Wise 03/28/2016, 10:25 AM

## 2016-03-29 ENCOUNTER — Encounter: Payer: Self-pay | Admitting: Ophthalmology

## 2016-03-30 ENCOUNTER — Other Ambulatory Visit: Payer: Self-pay

## 2016-04-03 ENCOUNTER — Other Ambulatory Visit: Payer: Self-pay

## 2016-04-03 NOTE — Telephone Encounter (Signed)
Please advise refill, thanks, last sent on 12/08/15 for #30 with 1 refill, thanks

## 2016-04-05 MED ORDER — ALPRAZOLAM 0.25 MG PO TABS: 0.2500 mg | ORAL_TABLET | Freq: Every evening | ORAL | 2 refills | Status: DC | PRN

## 2016-04-05 NOTE — Telephone Encounter (Signed)
refilled 

## 2016-04-05 NOTE — Telephone Encounter (Signed)
Faxed

## 2016-04-21 ENCOUNTER — Other Ambulatory Visit: Payer: Self-pay | Admitting: Internal Medicine

## 2016-05-02 DIAGNOSIS — C44722 Squamous cell carcinoma of skin of right lower limb, including hip: Secondary | ICD-10-CM | POA: Diagnosis not present

## 2016-05-02 DIAGNOSIS — L570 Actinic keratosis: Secondary | ICD-10-CM | POA: Diagnosis not present

## 2016-05-02 DIAGNOSIS — Z85828 Personal history of other malignant neoplasm of skin: Secondary | ICD-10-CM | POA: Diagnosis not present

## 2016-05-02 DIAGNOSIS — D692 Other nonthrombocytopenic purpura: Secondary | ICD-10-CM | POA: Diagnosis not present

## 2016-05-02 DIAGNOSIS — L812 Freckles: Secondary | ICD-10-CM | POA: Diagnosis not present

## 2016-05-02 DIAGNOSIS — L820 Inflamed seborrheic keratosis: Secondary | ICD-10-CM | POA: Diagnosis not present

## 2016-05-02 DIAGNOSIS — Z8589 Personal history of malignant neoplasm of other organs and systems: Secondary | ICD-10-CM

## 2016-05-02 DIAGNOSIS — L821 Other seborrheic keratosis: Secondary | ICD-10-CM | POA: Diagnosis not present

## 2016-05-02 DIAGNOSIS — D485 Neoplasm of uncertain behavior of skin: Secondary | ICD-10-CM | POA: Diagnosis not present

## 2016-05-02 DIAGNOSIS — L578 Other skin changes due to chronic exposure to nonionizing radiation: Secondary | ICD-10-CM | POA: Diagnosis not present

## 2016-05-02 HISTORY — DX: Personal history of malignant neoplasm of other organs and systems: Z85.89

## 2016-05-12 DIAGNOSIS — C4492 Squamous cell carcinoma of skin, unspecified: Secondary | ICD-10-CM

## 2016-05-12 HISTORY — DX: Squamous cell carcinoma of skin, unspecified: C44.92

## 2016-05-28 ENCOUNTER — Emergency Department: Payer: Medicare Other

## 2016-05-28 ENCOUNTER — Encounter: Payer: Self-pay | Admitting: Emergency Medicine

## 2016-05-28 ENCOUNTER — Emergency Department
Admission: EM | Admit: 2016-05-28 | Discharge: 2016-05-28 | Disposition: A | Discharge status: Home / Self Care | Payer: Medicare Other | Attending: Emergency Medicine | Admitting: Emergency Medicine

## 2016-05-28 DIAGNOSIS — Z87891 Personal history of nicotine dependence: Secondary | ICD-10-CM | POA: Insufficient documentation

## 2016-05-28 DIAGNOSIS — E039 Hypothyroidism, unspecified: Secondary | ICD-10-CM | POA: Diagnosis not present

## 2016-05-28 DIAGNOSIS — R42 Dizziness and giddiness: Secondary | ICD-10-CM | POA: Diagnosis present

## 2016-05-28 DIAGNOSIS — Z7982 Long term (current) use of aspirin: Secondary | ICD-10-CM | POA: Diagnosis not present

## 2016-05-28 DIAGNOSIS — R0602 Shortness of breath: Secondary | ICD-10-CM | POA: Diagnosis not present

## 2016-05-28 DIAGNOSIS — R079 Chest pain, unspecified: Secondary | ICD-10-CM | POA: Diagnosis not present

## 2016-05-28 DIAGNOSIS — Z79899 Other long term (current) drug therapy: Secondary | ICD-10-CM | POA: Diagnosis not present

## 2016-05-28 DIAGNOSIS — I10 Essential (primary) hypertension: Secondary | ICD-10-CM | POA: Diagnosis not present

## 2016-05-28 LAB — CBC
HCT: 38.6 % (ref 35.0–47.0)
HEMOGLOBIN: 12.9 g/dL (ref 12.0–16.0)
MCH: 28.7 pg (ref 26.0–34.0)
MCHC: 33.5 g/dL (ref 32.0–36.0)
MCV: 85.5 fL (ref 80.0–100.0)
Platelets: 171 10*3/uL (ref 150–440)
RBC: 4.52 MIL/uL (ref 3.80–5.20)
RDW: 15.1 % — ABNORMAL HIGH (ref 11.5–14.5)
WBC: 6.7 10*3/uL (ref 3.6–11.0)

## 2016-05-28 LAB — BASIC METABOLIC PANEL
ANION GAP: 6 (ref 5–15)
BUN: 15 mg/dL (ref 6–20)
CALCIUM: 9.1 mg/dL (ref 8.9–10.3)
CO2: 26 mmol/L (ref 22–32)
Chloride: 106 mmol/L (ref 101–111)
Creatinine, Ser: 0.93 mg/dL (ref 0.44–1.00)
GFR calc non Af Amer: 55 mL/min — ABNORMAL LOW (ref >60)
Glucose, Bld: 98 mg/dL (ref 65–99)
Potassium: 4 mmol/L (ref 3.5–5.1)
Sodium: 138 mmol/L (ref 135–145)

## 2016-05-28 LAB — TROPONIN I

## 2016-05-28 NOTE — ED Notes (Signed)
MD at bedside. 

## 2016-05-28 NOTE — ED Notes (Signed)
Pt reports CP upon awakening this AM, discomfort in nature intermittent. Denies pain at this time. Accompanied with SOB and lower leg swelling.

## 2016-05-28 NOTE — ED Provider Notes (Signed)
[facility] Emergency Department Provider Note   ____________________________________________    I have reviewed the triage vital signs and the nursing notes.   HISTORY  Chief Complaint Chest Pain     HPI Kathryn Wise is a 82 y.o. female who presents with complaints of chest pain. Patient notes she is concerned because her blood pressure has been elevated over the last several days which is unusual for her. Patient reports she had a brief episode of feeling dizzy this morning and also felt like her chest was aching at the same time. This resolved within minutes. Currently she has no chest pain or shortness of breath. She denies fevers or chills. No recent travel. No calf pain or swelling. No history of the same. She does have a family history of cardiac disease   Past Medical History:  Diagnosis Date  . Arthritis   . GERD (gastroesophageal reflux disease)   . Heart murmur    has had years and years  . History of shingles Dec 2013   treated with steroids , post op from shoulder surgery  . Hypertension   . Hypothyroidism   . Osteoarthritis of left shoulder 07/14/2012  . Polymyalgia rheumatica [facility])     Patient Active Problem List   Diagnosis Date Noted  . SOB (shortness of breath) 11/21/2015  . Major depressive disorder, single episode 11/05/2015  . Exertional dyspnea 11/05/2015  . Complaint of debility and malaise 04/28/2015  . Hypothyroidism 04/28/2015  . Hypertension 01/13/2014  . Osteopenia 10/25/2013  . GERD (gastroesophageal reflux disease) 09/11/2013  . Encounter for preventive health examination 09/11/2013  . Dysphagia, pharyngoesophageal phase 09/10/2013  . Heart murmur 11/28/2012  . History of shingles   . Polymyalgia rheumatica ([place]) 11/18/2012  . Edema 11/08/2012  . Osteoarthritis of left shoulder 07/14/2012    Past Surgical History:  Procedure Laterality Date  . ARTHOSCOPIC ROTAOR CUFF REPAIR     LEFT  10+  YEARS    .  CATARACT EXTRACTION W/ INTRAOCULAR LENS IMPLANT     RIGHT EYE  . CATARACT EXTRACTION W/PHACO Left 03/28/2016   Procedure: CATARACT EXTRACTION PHACO AND INTRAOCULAR LENS PLACEMENT ([facility]);  Surgeon: Leandrew Koyanagi, MD;  Location: [facility];  Service: Ophthalmology;  Laterality: Left;  TORIC  . FACELIFT    . FOOT SURGERY     rt foot   TUMOR REMOVED  . JOINT REPLACEMENT Left Dec 2013   shoulder  . SKIN CANCER EXCISION    . TOTAL SHOULDER ARTHROPLASTY  07/14/2012   Procedure: TOTAL SHOULDER ARTHROPLASTY;  Surgeon: Johnny Bridge, MD;  Location: [facility];  Service: Orthopedics;  Laterality: Left;    Prior to Admission medications   Medication Sig Start Date End Date Taking? Authorizing Provider  ALPRAZolam (XANAX) 0.25 MG tablet Take 1 tablet (0.25 mg total) by mouth at bedtime as needed for anxiety. 04/05/16   [name], MD  aspirin 81 MG tablet Take 81 mg by mouth daily.    Historical Provider, MD  Chlorphen-Pseudoephed-APAP (CORICIDIN D PO) Take by mouth.    Historical Provider, MD  Cyanocobalamin (VITAMIN B 12) 100 MCG LOZG Take by mouth daily.    Historical Provider, MD  levothyroxine (SYNTHROID, LEVOTHROID) 88 MCG tablet TAKE 1 TABLET BY MOUTH ONCE DAILY BEFOREBREAKFAST Patient taking differently: TAKE 1 TABLET BY MOUTH ONCE DAILY BEFORE BEDTIME 02/21/16   [name], MD  lisinopril (PRINIVIL,ZESTRIL) 10 MG tablet Take 1 tablet (10 mg total) by mouth daily. NEED  APPT FOR ADDITIONAL REFILLS. PLEASE CALL OFFICE SOON 04/23/16   [name], MD  Multiple Vitamin (MULTIVITAMIN WITH MINERALS) TABS Take 1 tablet by mouth daily.    Historical Provider, MD  omeprazole (PRILOSEC) 20 MG capsule TAKE 1 CAPSULE BY MOUTH EVERY MORNING, PRIOR TO BREAKFAST. 01/16/16   [name], MD  raloxifene (EVISTA) 60 MG tablet Take 1 tablet (60 mg total) by mouth daily. 12/08/15   [name], MD  RESTASIS 0.05 % ophthalmic emulsion  10/22/15   Historical Provider, MD      Allergies Alendronate and Oxycontin [oxycodone hcl]  Family History  Problem Relation Age of Onset  . Cancer Daughter   . Breast cancer Daughter 82  . Cancer Son     Social History Social History  Substance Use Topics  . Smoking status: Former Smoker    Types: Cigarettes    Quit date: 07/30/1972  . Smokeless tobacco: Never Used     Comment: smoked for only a few months  . Alcohol use Yes     Comment: OCCAS WINE     Review of Systems  Constitutional: No fever/chills Eyes: No visual changes.  ENT: No sore throat. Cardiovascular: As above Respiratory: Denies shortness of breath. Gastrointestinal: No abdominal pain.  Genitourinary: Negative for dysuria. Musculoskeletal: Negative for back pain. Skin: Negative for rash. Neurological: Negative for headaches   10-point ROS otherwise negative.  ____________________________________________   PHYSICAL EXAM:  VITAL SIGNS: ED Triage Vitals  Enc Vitals Group     BP 05/28/16 0854 (!) 191/68     Pulse Rate 05/28/16 0854 70     Resp 05/28/16 0854 18     Temp 05/28/16 0854 97.6 F (36.4 C)     Temp Source 05/28/16 0854 Oral     SpO2 05/28/16 0854 96 %     Weight 05/28/16 0853 160 lb (72.6 kg)     Height 05/28/16 0853 5\' 7"  (1.702 m)     Head Circumference --      Peak Flow --      Pain Score 05/28/16 0854 5     Pain Loc --      Pain Edu? --      Excl. in [place]? --     Constitutional: Alert and oriented. No acute distress. Pleasant and interactive Eyes: Conjunctivae are normal.  Nose: No congestion/rhinnorhea. Mouth/Throat: Mucous membranes are moist.    Cardiovascular: Normal rate, regular rhythm. Grossly normal heart sounds.  Good peripheral circulation. Respiratory: Normal respiratory effort.  No retractions. Lungs CTAB. Gastrointestinal: Soft and nontender. No distention.  No CVA tenderness. Genitourinary: deferred Musculoskeletal: No lower extremity tenderness nor edema.  Warm and well perfused Neurologic:   Normal speech and language. No gross focal neurologic deficits are appreciated.  Skin:  Skin is warm, dry and intact. No rash noted. Psychiatric: Mood and affect are normal. Speech and behavior are normal.  ____________________________________________   LABS (all labs ordered are listed, but only abnormal results are displayed)  Labs Reviewed  BASIC METABOLIC PANEL - Abnormal; Notable for the following:       Result Value   GFR calc non Af Amer 55 (*)    All other components within normal limits  CBC - Abnormal; Notable for the following:    RDW 15.1 (*)    All other components within normal limits  TROPONIN I  TROPONIN I   ____________________________________________  EKG  ED ECG REPORT I, Lavonia Drafts, the attending physician, personally viewed and interpreted this  ECG.  Date: 05/28/2016 EKG Time: 8:50 AM Rate: 67 Rhythm: normal sinus rhythm QRS Axis: normal Intervals: normal ST/T Wave abnormalities: Inverted T's in V5 and V6, this is old and unchanged Conduction Disturbances: none Narrative Interpretation: unremarkable  ____________________________________________  RADIOLOGY  Chest x-ray unremarkable ____________________________________________   PROCEDURES  Procedure(s) performed: No    Critical Care performed: No ____________________________________________   INITIAL IMPRESSION / ASSESSMENT AND PLAN / ED COURSE  Pertinent labs & imaging results that were available during my care of the patient were reviewed by me and considered in my medical decision making (see chart for details).  Patient well-appearing and in no distress. She is chest pain-free in the emergency department. Her EKG is unchanged from prior. Her lab work is reassuring. She is primarily concerned about her blood pressure. It is improving without intervention. We will check a second troponin. If normal will ask the patient follow up closely with her PCP and likely  cardiology.    Second troponin is normal. Patient feels well and is symptom-free. We'll double her lisinopril until she can see Dr. Derrel Nip  Strict return precautions discussed with patient. ___________________   FINAL CLINICAL IMPRESSION(S) / ED DIAGNOSES  Final diagnoses:  Dizziness  Hypertension, unspecified type      NEW MEDICATIONS STARTED DURING THIS VISIT:  New Prescriptions   No medications on file     Note:  This document was prepared using Dragon voice recognition software and may include unintentional dictation errors.    Lavonia Drafts, MD 05/28/16 [id number]

## 2016-05-28 NOTE — ED Notes (Signed)
Pt alert and oriented X4, active, cooperative, pt in NAD. RR even and unlabored, color WNL.  Pt informed to return if any life threatening symptoms occur.   

## 2016-05-28 NOTE — ED Notes (Signed)
Pt instructed to "double up" on her blood pressure medication due to high blood pressure. MD instructed patient to do so.

## 2016-05-28 NOTE — ED Triage Notes (Signed)
C/O swelling to hands, legs, feet, and abdomen on Friday.  This morning woke up and felt as if she could not focus and chest pain.  C/O chest discomfort to left chest and generally in abdomen.

## 2016-05-30 ENCOUNTER — Ambulatory Visit (INDEPENDENT_AMBULATORY_CARE_PROVIDER_SITE_OTHER): Payer: Medicare Other | Admitting: Internal Medicine

## 2016-05-30 ENCOUNTER — Encounter: Payer: Self-pay | Admitting: Internal Medicine

## 2016-05-30 DIAGNOSIS — R011 Cardiac murmur, unspecified: Secondary | ICD-10-CM

## 2016-05-30 DIAGNOSIS — R079 Chest pain, unspecified: Secondary | ICD-10-CM

## 2016-05-30 DIAGNOSIS — R0609 Other forms of dyspnea: Secondary | ICD-10-CM

## 2016-05-30 DIAGNOSIS — I10 Essential (primary) hypertension: Secondary | ICD-10-CM | POA: Diagnosis not present

## 2016-05-30 DIAGNOSIS — Z23 Encounter for immunization: Secondary | ICD-10-CM | POA: Diagnosis not present

## 2016-05-30 DIAGNOSIS — R0602 Shortness of breath: Secondary | ICD-10-CM | POA: Diagnosis not present

## 2016-05-30 MED ORDER — LISINOPRIL 20 MG PO TABS: 20.0000 mg | ORAL_TABLET | Freq: Every day | ORAL | 1 refills | Status: DC

## 2016-05-30 MED ORDER — POTASSIUM CHLORIDE CRYS ER 20 MEQ PO TBCR: 20.0000 meq | EXTENDED_RELEASE_TABLET | Freq: Every day | ORAL | 3 refills | Status: DC

## 2016-05-30 MED ORDER — FUROSEMIDE 20 MG PO TABS: 20.0000 mg | ORAL_TABLET | Freq: Every day | ORAL | 3 refills | Status: DC

## 2016-05-30 NOTE — Patient Instructions (Addendum)
Take the fluid pill  Only if your weight goes up by  2 lb overnight  OR  if you develop ankle swelling.  Continue taking it daily until weight is back to baseline  Take the potassium only with the fluid pill,  Not on the other days \  continue 20 mg lisinopril   If BP is not below 135/85 in one week,  Double the lisinopril dose to 40 mg daily.     return to office in two  week for RN visit to check BP here and to compare with your home machine (bring it with you)

## 2016-05-30 NOTE — Progress Notes (Signed)
Subjective:  Patient ID: Kathryn Wise, female    DOB: 30-Mar-1930  Age: 82 y.o. MRN: [id number]  CC: Diagnoses of Encounter for immunization, SOB (shortness of breath), Essential hypertension, Heart murmur, Exertional dyspnea, and Chest pain, unspecified type were pertinent to this visit.  HPI [name] presents for Er follow up for chest pain accompanied by  Dizziness, hypertension and edema.  Treated in ER on Monday Oct 30  Am and sent home.    History: 3 days prior; started having bilateral ankle edema. First noted  on Friday after eating out .  ON Saturday abdomen felt distended and hands puffy. Legs became red and so tender she could barely walk, but went to church on Sunday.  On Monday became dizzy walking down the hallway.  BP prior to taking her  Medi so she called a family member  who took her to ER.  Pre episode weight was 151,  Weighed on Friday evening weight was  160      ER Evaluation: SBP 190,   troponins negative x 2, no acute EKG changes. chest x ray normal except for aortic atherosclerosis   Was DC'd home with her lisnopril dose doubled   Systolic BP this morning was 152 at home.  She feels fine   Outpatient Medications Prior to Visit  Medication Sig Dispense Refill  . ALPRAZolam (XANAX) 0.25 MG tablet Take 1 tablet (0.25 mg total) by mouth at bedtime as needed for anxiety. 30 tablet 2  . aspirin 81 MG tablet Take 81 mg by mouth daily.    . Chlorphen-Pseudoephed-APAP (CORICIDIN D PO) Take by mouth.    . Cyanocobalamin (VITAMIN B 12) 100 MCG LOZG Take by mouth daily.    [name] levothyroxine (SYNTHROID, LEVOTHROID) 88 MCG tablet TAKE 1 TABLET BY MOUTH ONCE DAILY BEFOREBREAKFAST (Patient taking differently: TAKE 1 TABLET BY MOUTH ONCE DAILY BEFORE BEDTIME) 90 tablet 1  . Multiple Vitamin (MULTIVITAMIN WITH MINERALS) TABS Take 1 tablet by mouth daily.    [name] omeprazole (PRILOSEC) 20 MG capsule TAKE 1 CAPSULE BY MOUTH EVERY MORNING, PRIOR TO BREAKFAST. 90 capsule 1  .  raloxifene (EVISTA) 60 MG tablet Take 1 tablet (60 mg total) by mouth daily. 30 tablet 5  . RESTASIS 0.05 % ophthalmic emulsion     . lisinopril (PRINIVIL,ZESTRIL) 10 MG tablet Take 1 tablet (10 mg total) by mouth daily. NEED APPT FOR ADDITIONAL REFILLS. PLEASE CALL OFFICE SOON 90 tablet 0   No facility-administered medications prior to visit.     Review of Systems;  Patient denies headache, fevers, malaise, unintentional weight loss, skin rash, eye pain, sinus congestion and sinus pain, sore throat, dysphagia,  hemoptysis , cough, dyspnea, wheezing, chest pain, palpitations, orthopnea, edema, abdominal pain, nausea, melena, diarrhea, constipation, flank pain, dysuria, hematuria, urinary  Frequency, nocturia, numbness, tingling, seizures,  Focal weakness, Loss of consciousness,  Tremor, insomnia, depression, anxiety, and suicidal ideation.      Objective:  BP (!) 160/78   Pulse 77   Temp 97.8 F (36.6 C) (Oral)   Resp 12   Ht 5\' 7"  (1.702 m)   Wt 162 lb 4 oz (73.6 kg)   SpO2 98%   BMI 25.41 kg/m   BP Readings from Last 3 Encounters:  05/30/16 (!) 160/78  05/28/16 (!) 178/66  03/28/16 (!) 144/67    Wt Readings from Last 3 Encounters:  05/30/16 162 lb 4 oz (73.6 kg)  05/28/16 160 lb (72.6 kg)  03/28/16 157 lb (71.2 kg)  General appearance: alert, cooperative and appears stated age Neck: no adenopathy, no carotid bruit, supple, symmetrical, trachea midline and thyroid not enlarged, symmetric, no tenderness/mass/nodules Back: symmetric, no curvature. ROM normal. No CVA tenderness. Lungs: clear to auscultation bilaterally Heart: regular rate and rhythm, S1, S2 normal, no murmur, click, rub or gallop Abdomen: soft, non-tender; bowel sounds normal; no masses,  no organomegaly Pulses: 2+ and symmetric Skin: Skin color, texture, turgor normal. No rashes or lesions Lymph nodes: Cervical, supraclavicular, and axillary nodes normal.  Lab Results  Component Value Date   HGBA1C  6.1 12/08/2015    Lab Results  Component Value Date   CREATININE 0.93 05/28/2016   CREATININE 1.04 10/31/2015   CREATININE 0.93 04/28/2015    Lab Results  Component Value Date   WBC 6.7 05/28/2016   HGB 12.9 05/28/2016   HCT 38.6 05/28/2016   PLT 171 05/28/2016   GLUCOSE 98 05/28/2016   CHOL 203 (H) 10/31/2015   TRIG 126.0 10/31/2015   HDL 48.30 10/31/2015   LDLDIRECT 209.0 09/10/2013   LDLCALC 129 (H) 10/31/2015   ALT 15 10/31/2015   AST 21 10/31/2015   NA 138 05/28/2016   K 4.0 05/28/2016   CL 106 05/28/2016   CREATININE 0.93 05/28/2016   BUN 15 05/28/2016   CO2 26 05/28/2016   TSH 3.380 01/17/2016   INR 0.98 07/09/2012   HGBA1C 6.1 12/08/2015    Dg Chest 2 View  Result Date: 05/28/2016 CLINICAL DATA:  Left-sided chest pain and shortness breath beginning this morning. Increased peripheral edema for 3 days. EXAM: CHEST  2 VIEW COMPARISON:  12/29/2012 FINDINGS: The heart size and mediastinal contours are within normal limits. Aortic atherosclerosis. Both lungs are clear. No evidence of pleural effusion or pneumothorax. Left shoulder prosthesis again noted. IMPRESSION: No active cardiopulmonary disease.  Aortic atherosclerosis. Electronically Signed   By: Earle Gell M.D.   On: 05/28/2016 09:36    Assessment & Plan:   Problem List Items Addressed This Visit    Heart murmur    Aortic sclerosis without stenosis by May 2017 ECHO      Hypertension    Increased lisinopril dose to 20 mg daily .  Repeat BP and BMET in one week       Relevant Medications   lisinopril (PRINIVIL,ZESTRIL) 20 MG tablet   furosemide (LASIX) 20 MG tablet   Exertional dyspnea    There were no WMA on ECHO and she participates in water aerobics regularly without chest pain has deferred stress testing offered by Cardiology .       SOB (shortness of breath)    Acute diastolid dysfunction now resolved.  Reviewed recent ECHO noting LVH concentric hypertrophy , diastolic dysfunction.  Advised to  increase lisinopril,  Weight daily and use furosemide onc edaily for wt gain of 2 lbs overnight. .       Chest pain    Resolved,  Occurred in the setting of volume overload and malignant hypertension. Tropoinins in er were negative x 2 and no EKG changes were noted.  Patient is stable post discharge from ER and has no new issues or questions about discharge plans at the visit today for ER follow up.  I have reviewed the records from the ER evaluation in detail with patient today.       Other Visit Diagnoses    Encounter for immunization       Relevant Orders   Flu vaccine HIGH DOSE PF (Completed)     A total  of 25 minutes of face to face time was spent with patient more than half of which was spent in counselling about the above mentioned conditions  and coordination of care .   I have changed Ms. Gutter's lisinopril. I am also having her start on furosemide and potassium chloride SA. Additionally, I am having her maintain her multivitamin with minerals, RESTASIS, Vitamin B 12, raloxifene, omeprazole, levothyroxine, aspirin, Chlorphen-Pseudoephed-APAP (CORICIDIN D PO), and ALPRAZolam.  Meds ordered this encounter  Medications  . lisinopril (PRINIVIL,ZESTRIL) 20 MG tablet    Sig: Take 1 tablet (20 mg total) by mouth daily. Keep on file for future refills    Dispense:  90 tablet    Refill:  1  . furosemide (LASIX) 20 MG tablet    Sig: Take 1 tablet (20 mg total) by mouth daily. As needed for fluid retention    Dispense:  30 tablet    Refill:  3  . potassium chloride SA (K-DUR,KLOR-CON) 20 MEQ tablet    Sig: Take 1 tablet (20 mEq total) by mouth daily. With fluid pill only    Dispense:  30 tablet    Refill:  3    Medications Discontinued During This Encounter  Medication Reason  . lisinopril (PRINIVIL,ZESTRIL) 10 MG tablet Reorder    Follow-up: Return in about 2 weeks (around 06/13/2016), or RN visit for BP/BMET .   [name], MD

## 2016-05-30 NOTE — Progress Notes (Signed)
Pre-visit discussion using our clinic review tool. No additional management support is needed unless otherwise documented below in the visit note.  

## 2016-05-31 DIAGNOSIS — R072 Precordial pain: Secondary | ICD-10-CM | POA: Insufficient documentation

## 2016-05-31 DIAGNOSIS — R079 Chest pain, unspecified: Secondary | ICD-10-CM | POA: Insufficient documentation

## 2016-05-31 NOTE — Assessment & Plan Note (Signed)
Acute diastolid dysfunction now resolved.  Reviewed recent ECHO noting LVH concentric hypertrophy , diastolic dysfunction.  Advised to increase lisinopril,  Weight daily and use furosemide onc edaily for wt gain of 2 lbs overnight. [name]

## 2016-05-31 NOTE — Assessment & Plan Note (Signed)
Resolved,  Occurred in the setting of volume overload and malignant hypertension. Tropoinins in er were negative x 2 and no EKG changes were noted.  Patient is stable post discharge from ER and has no new issues or questions about discharge plans at the visit today for ER follow up.  I have reviewed the records from the ER evaluation in detail with patient today.

## 2016-05-31 NOTE — Assessment & Plan Note (Signed)
Aortic sclerosis without stenosis by May 2017 ECHO

## 2016-05-31 NOTE — Assessment & Plan Note (Signed)
Increased lisinopril dose to 20 mg daily .  Repeat BP and BMET in one week

## 2016-05-31 NOTE — Assessment & Plan Note (Signed)
There were no WMA on ECHO and she participates in water aerobics regularly without chest pain has deferred stress testing offered by Cardiology .

## 2016-06-13 ENCOUNTER — Ambulatory Visit (INDEPENDENT_AMBULATORY_CARE_PROVIDER_SITE_OTHER): Payer: Medicare Other

## 2016-06-13 VITALS — BP 118/62 | HR 90 | Resp 18

## 2016-06-13 DIAGNOSIS — I10 Essential (primary) hypertension: Secondary | ICD-10-CM

## 2016-06-13 NOTE — Progress Notes (Signed)
Patient came in for 2 week BP check.  At that [place] she was instructed to increase her lisinopril to 20mg .  She has been tolerated that change with no issues.  Patient brought home cuff to appt to compare with our readings, see vitals for details on numbers.   She uses a wrist cuff at home.   Please advise. thanks

## 2016-06-17 NOTE — Progress Notes (Signed)
  I have reviewed the above information and agree with above.   Spiro Ausborn, MD 

## 2016-07-14 ENCOUNTER — Emergency Department (HOSPITAL_COMMUNITY)
Admission: EM | Admit: 2016-07-14 | Discharge: 2016-07-14 | Disposition: A | Discharge status: Home / Self Care | Payer: Medicare Other | Attending: Emergency Medicine | Admitting: Emergency Medicine

## 2016-07-14 ENCOUNTER — Emergency Department (HOSPITAL_COMMUNITY): Payer: Medicare Other

## 2016-07-14 ENCOUNTER — Encounter (HOSPITAL_COMMUNITY): Payer: Self-pay | Admitting: *Deleted

## 2016-07-14 DIAGNOSIS — W1830XA Fall on same level, unspecified, initial encounter: Secondary | ICD-10-CM | POA: Insufficient documentation

## 2016-07-14 DIAGNOSIS — Z7982 Long term (current) use of aspirin: Secondary | ICD-10-CM | POA: Insufficient documentation

## 2016-07-14 DIAGNOSIS — Y999 Unspecified external cause status: Secondary | ICD-10-CM | POA: Diagnosis not present

## 2016-07-14 DIAGNOSIS — S52022A Displaced fracture of olecranon process without intraarticular extension of left ulna, initial encounter for closed fracture: Secondary | ICD-10-CM | POA: Diagnosis not present

## 2016-07-14 DIAGNOSIS — Y9301 Activity, walking, marching and hiking: Secondary | ICD-10-CM | POA: Diagnosis not present

## 2016-07-14 DIAGNOSIS — Z85828 Personal history of other malignant neoplasm of skin: Secondary | ICD-10-CM | POA: Insufficient documentation

## 2016-07-14 DIAGNOSIS — Z96611 Presence of right artificial shoulder joint: Secondary | ICD-10-CM | POA: Diagnosis not present

## 2016-07-14 DIAGNOSIS — E039 Hypothyroidism, unspecified: Secondary | ICD-10-CM | POA: Insufficient documentation

## 2016-07-14 DIAGNOSIS — I10 Essential (primary) hypertension: Secondary | ICD-10-CM | POA: Diagnosis not present

## 2016-07-14 DIAGNOSIS — Y929 Unspecified place or not applicable: Secondary | ICD-10-CM | POA: Insufficient documentation

## 2016-07-14 DIAGNOSIS — Z79899 Other long term (current) drug therapy: Secondary | ICD-10-CM | POA: Diagnosis not present

## 2016-07-14 DIAGNOSIS — Z87891 Personal history of nicotine dependence: Secondary | ICD-10-CM | POA: Insufficient documentation

## 2016-07-14 DIAGNOSIS — S59902A Unspecified injury of left elbow, initial encounter: Secondary | ICD-10-CM | POA: Diagnosis present

## 2016-07-14 DIAGNOSIS — W19XXXA Unspecified fall, initial encounter: Secondary | ICD-10-CM

## 2016-07-14 MED ORDER — TRAMADOL HCL 50 MG PO TABS
50.0000 mg | ORAL_TABLET | Freq: Once | ORAL | Status: AC
  Administered 2016-07-14: 50 mg via ORAL
  Filled 2016-07-14: qty 1

## 2016-07-14 NOTE — ED Provider Notes (Signed)
[facility] DEPT Provider Note   CSN: [id number] Arrival date & time: 07/14/16  1432     History   Chief Complaint Chief Complaint  Patient presents with  . Elbow Injury    HPI Kathryn Wise is a 82 y.o. female.   HPI   Presents with fall that occurred around 1pm today. Was walking into her home carrying dishes when she fell and landed on her L elbow. Tried taking aleve at home with some improvement in pain but it has significantly swollen over the last few hours. Denies hitting her head, no LOC associated with this. No numbness/tingling or weakness. Pain is moderate right now. No chest pain or SOB. Extending and direct palpation makes pain worse. Not on anticoagulation. Was able to get up and ambulate without difficulty.   Past Medical History:  Diagnosis Date  . Arthritis   . GERD (gastroesophageal reflux disease)   . Heart murmur    has had years and years  . History of shingles Dec 2013   treated with steroids , post op from shoulder surgery  . Hypertension   . Hypothyroidism   . Osteoarthritis of left shoulder 07/14/2012  . Polymyalgia rheumatica [facility])     Patient Active Problem List   Diagnosis Date Noted  . Chest pain 05/31/2016  . SOB (shortness of breath) 11/21/2015  . Major depressive disorder, single episode 11/05/2015  . Exertional dyspnea 11/05/2015  . Complaint of debility and malaise 04/28/2015  . Hypothyroidism 04/28/2015  . Hypertension 01/13/2014  . Osteopenia 10/25/2013  . GERD (gastroesophageal reflux disease) 09/11/2013  . Encounter for preventive health examination 09/11/2013  . Dysphagia, pharyngoesophageal phase 09/10/2013  . Heart murmur 11/28/2012  . History of shingles   . Polymyalgia rheumatica ([facility]) 11/18/2012  . Edema 11/08/2012  . Osteoarthritis of left shoulder 07/14/2012    Past Surgical History:  Procedure Laterality Date  . ARTHOSCOPIC ROTAOR CUFF REPAIR     LEFT  10+  YEARS    . CATARACT EXTRACTION W/ INTRAOCULAR  LENS IMPLANT     RIGHT EYE  . CATARACT EXTRACTION W/PHACO Left 03/28/2016   Procedure: CATARACT EXTRACTION PHACO AND INTRAOCULAR LENS PLACEMENT (IOC);  Surgeon: Leandrew Koyanagi, MD;  Location: [facility];  Service: Ophthalmology;  Laterality: Left;  TORIC  . FACELIFT    . FOOT SURGERY     rt foot   TUMOR REMOVED  . JOINT REPLACEMENT Left Dec 2013   shoulder  . SKIN CANCER EXCISION    . TOTAL SHOULDER ARTHROPLASTY  07/14/2012   Procedure: TOTAL SHOULDER ARTHROPLASTY;  Surgeon: Johnny Bridge, MD;  Location: [facility];  Service: Orthopedics;  Laterality: Left;    OB History    No data available       Home Medications    Prior to Admission medications   Medication Sig Start Date End Date Taking? Authorizing Provider  ALPRAZolam (XANAX) 0.25 MG tablet Take 1 tablet (0.25 mg total) by mouth at bedtime as needed for anxiety. 04/05/16  Yes [name], MD  aspirin 81 MG tablet Take 81 mg by mouth daily.   Yes Historical Provider, MD  Cyanocobalamin (VITAMIN B 12) 100 MCG LOZG Take 1 tablet by mouth daily.    Yes Historical Provider, MD  furosemide (LASIX) 20 MG tablet Take 1 tablet (20 mg total) by mouth daily. As needed for fluid retention Patient taking differently: Take 20 mg by mouth daily as needed for fluid. As needed for fluid retention 05/30/16  Yes Helene Kelp  Ether Griffins, MD  levothyroxine (SYNTHROID, LEVOTHROID) 88 MCG tablet TAKE 1 TABLET BY MOUTH ONCE DAILY BEFOREBREAKFAST Patient taking differently: TAKE 1 TABLET BY MOUTH ONCE DAILY BEFORE BEDTIME 02/21/16  Yes [name], MD  lisinopril (PRINIVIL,ZESTRIL) 20 MG tablet Take 1 tablet (20 mg total) by mouth daily. Keep on file for future refills 05/30/16  Yes [name], MD  omeprazole (PRILOSEC) 20 MG capsule TAKE 1 CAPSULE BY MOUTH EVERY MORNING, PRIOR TO BREAKFAST. 01/16/16  Yes [name], MD  Polyethyl Glycol-Propyl Glycol (SYSTANE OP) Apply 1-2 drops to eye daily as needed (dryness).   Yes Historical Provider, MD    potassium chloride SA (K-DUR,KLOR-CON) 20 MEQ tablet Take 1 tablet (20 mEq total) by mouth daily. With fluid pill only Patient taking differently: Take 20 mEq by mouth daily as needed (when taking a fluid pill). With fluid pill only 05/30/16  Yes [name], MD  raloxifene (EVISTA) 60 MG tablet Take 1 tablet (60 mg total) by mouth daily. 12/08/15  Yes [name], MD  RESTASIS 0.05 % ophthalmic emulsion Place 1 drop into both eyes 2 (two) times daily.  10/22/15  Yes Historical Provider, MD    Family History Family History  Problem Relation Age of Onset  . Cancer Daughter   . Breast cancer Daughter 27  . Cancer Son     Social History Social History  Substance Use Topics  . Smoking status: Former Smoker    Types: Cigarettes    Quit date: 07/30/1972  . Smokeless tobacco: Never Used     Comment: smoked for only a few months  . Alcohol use Yes     Comment: OCCAS WINE      Allergies   Alendronate and Oxycontin [oxycodone hcl]   Review of Systems Review of Systems  Gastrointestinal: Negative for nausea and vomiting.  Genitourinary: Negative for dysuria and hematuria.  Musculoskeletal: Positive for arthralgias (L elbow with swelling) and joint swelling (L elbow). Negative for neck pain and neck stiffness.  Skin: Positive for color change (bruise directly over elbow).  Neurological: Negative for weakness and numbness.  All other systems reviewed and are negative.    Physical Exam Updated Vital Signs BP 168/58   Pulse 90   Temp 98.2 F (36.8 C) (Oral)   Resp 16   Wt 72.1 kg   SpO2 96%   BMI 24.90 kg/m   Physical Exam  Constitutional: She appears well-developed and well-nourished. No distress.  HENT:  Head: Normocephalic and atraumatic.  Eyes: Conjunctivae are normal.  Neck: Normal range of motion. Neck supple.  Cardiovascular: Regular rhythm.  Bradycardia present.   No murmur heard. Pulmonary/Chest: Effort normal and breath sounds normal. No respiratory  distress.  Abdominal: Soft. There is no tenderness.  Musculoskeletal: She exhibits no edema.  Neurological: She is alert. She has normal strength. No sensory deficit. GCS eye subscore is 4. GCS verbal subscore is 5. GCS motor subscore is 6.  Able to adduct and abduct all fingers in L hand against resistance. Sensation intact in all fingers.   Skin: Skin is warm and dry.  Psychiatric: She has a normal mood and affect.  Nursing note and vitals reviewed.    ED Treatments / Results  Labs (all labs ordered are listed, but only abnormal results are displayed) Labs Reviewed - No data to display  EKG  EKG Interpretation None       Radiology Dg Elbow Complete Left  Result Date: 07/14/2016 CLINICAL DATA:  Status  post fall with left elbow pain. EXAM: LEFT ELBOW - COMPLETE 3+ VIEW COMPARISON:  None. FINDINGS: There is an avulsion fracture of the olecranon with the avulsed fragment displaced superiorly. There is a posterior joint effusion and soft tissue swelling. Osteoarthritic changes are seen at the elbow joint. IMPRESSION: Avulsion fracture of the olecranon with superior displacement of the avulsed fragment, and associated joint effusion and soft tissue swelling. Electronically Signed   By: Fidela Salisbury M.D.   On: 07/14/2016 15:46    Procedures Procedures (including critical care time)  Medications Ordered in ED Medications  traMADol (ULTRAM) tablet 50 mg (50 mg Oral Given 07/14/16 1708)     Initial Impression / Assessment and Plan / ED Course  I have reviewed the triage vital signs and the nursing notes.  Pertinent labs & imaging results that were available during my care of the patient were reviewed by me and considered in my medical decision making (see chart for details).  Clinical Course      Patient is an 82 year old female who presents today with left olecranon fracture after a fall at home. Patient denies any head trauma and is not on any anticoagulation. She did  have bradycardia on the monitor here but was mentating at baseline and states that this frequently happens in the past. She is also hypertensive as well which the patient endorses having a long history of this as well.  On exam of her left elbow, there is significant swelling. No evidence of ulnar nerve injury on my exam. Her vasculature is intact distally. I discussed with orthopedic surgeon Dr. Roxine Caddy who is agreeable with posterior long arm splint in flexion. He commenced close follow-up Monday with orthopedic surgeon. Patient can see her surgeon that she typically sees in the past. Patient is agreeable with this. She was able to ambulate without difficulty at the conclusion of our exam and was discharged on in good condition.  Final Clinical Impressions(s) / ED Diagnoses   Final diagnoses:  Closed fracture of left olecranon process, initial encounter  Fall, initial encounter    New Prescriptions Discharge Medication List as of 07/14/2016  8:50 PM       Kathryn Quay, MD 07/15/16 [id number]    Carmin Muskrat, MD 07/15/16 0031

## 2016-07-14 NOTE — ED Notes (Signed)
Pt has ice pack, sitting with visitors

## 2016-07-14 NOTE — ED Notes (Signed)
Pt states Dr. Mardelle Matte is her orthopedist.

## 2016-07-14 NOTE — ED Triage Notes (Signed)
Pt is a patient at [facility] and had a fall today and struck her left elbow.  Pt states that she feels that she broke her elbow and can feel the bone move.  Pt did not hit her head, no LOC.

## 2016-07-14 NOTE — ED Notes (Signed)
Ring that was on pt left ring finger was removed and placed in a zipper pocket in her purse.

## 2016-07-14 NOTE — ED Notes (Signed)
Ortho contacted to provide long arm splint with 45 degree flexion for left arm

## 2016-07-14 NOTE — Progress Notes (Signed)
Orthopedic Tech Progress Note Patient Details:  Kathryn Wise May 02, 1930 [id number]  Ortho Devices Type of Ortho Device: Post (long arm) splint Ortho Device/Splint Location: lue Ortho Device/Splint Interventions: Ordered, Application   Karolee Stamps 07/14/2016, 11:13 PM

## 2016-07-16 DIAGNOSIS — S52025A Nondisplaced fracture of olecranon process without intraarticular extension of left ulna, initial encounter for closed fracture: Secondary | ICD-10-CM | POA: Diagnosis not present

## 2016-07-18 ENCOUNTER — Telehealth: Payer: Self-pay | Admitting: *Deleted

## 2016-07-18 NOTE — Telephone Encounter (Signed)
[facility] has requested last office notes , along with last CPE notes and EKG if availiable. Contact Sunday Spillers [phone] Fax [phone]

## 2016-07-18 NOTE — Telephone Encounter (Signed)
Records faxed to [facility] as requested.

## 2016-07-20 DIAGNOSIS — G8918 Other acute postprocedural pain: Secondary | ICD-10-CM | POA: Diagnosis not present

## 2016-07-20 DIAGNOSIS — Y999 Unspecified external cause status: Secondary | ICD-10-CM | POA: Diagnosis not present

## 2016-07-20 DIAGNOSIS — S52032D Displaced fracture of olecranon process with intraarticular extension of left ulna, subsequent encounter for closed fracture with routine healing: Secondary | ICD-10-CM | POA: Diagnosis not present

## 2016-07-20 DIAGNOSIS — S52022A Displaced fracture of olecranon process without intraarticular extension of left ulna, initial encounter for closed fracture: Secondary | ICD-10-CM | POA: Diagnosis not present

## 2016-07-20 DIAGNOSIS — S52031A Displaced fracture of olecranon process with intraarticular extension of right ulna, initial encounter for closed fracture: Secondary | ICD-10-CM | POA: Diagnosis not present

## 2016-08-01 DIAGNOSIS — S52025D Nondisplaced fracture of olecranon process without intraarticular extension of left ulna, subsequent encounter for closed fracture with routine healing: Secondary | ICD-10-CM | POA: Diagnosis not present

## 2016-08-07 ENCOUNTER — Other Ambulatory Visit: Payer: Self-pay | Admitting: Internal Medicine

## 2016-08-08 DIAGNOSIS — L570 Actinic keratosis: Secondary | ICD-10-CM | POA: Diagnosis not present

## 2016-08-08 DIAGNOSIS — L812 Freckles: Secondary | ICD-10-CM | POA: Diagnosis not present

## 2016-08-08 DIAGNOSIS — L578 Other skin changes due to chronic exposure to nonionizing radiation: Secondary | ICD-10-CM | POA: Diagnosis not present

## 2016-08-08 DIAGNOSIS — L820 Inflamed seborrheic keratosis: Secondary | ICD-10-CM | POA: Diagnosis not present

## 2016-08-08 DIAGNOSIS — Z85828 Personal history of other malignant neoplasm of skin: Secondary | ICD-10-CM | POA: Diagnosis not present

## 2016-08-08 DIAGNOSIS — L821 Other seborrheic keratosis: Secondary | ICD-10-CM | POA: Diagnosis not present

## 2016-08-18 ENCOUNTER — Other Ambulatory Visit: Payer: Self-pay | Admitting: Internal Medicine

## 2016-08-20 DIAGNOSIS — S52025D Nondisplaced fracture of olecranon process without intraarticular extension of left ulna, subsequent encounter for closed fracture with routine healing: Secondary | ICD-10-CM | POA: Diagnosis not present

## 2016-08-27 DIAGNOSIS — M25622 Stiffness of left elbow, not elsewhere classified: Secondary | ICD-10-CM | POA: Diagnosis not present

## 2016-08-27 DIAGNOSIS — M6281 Muscle weakness (generalized): Secondary | ICD-10-CM | POA: Diagnosis not present

## 2016-08-29 DIAGNOSIS — M6281 Muscle weakness (generalized): Secondary | ICD-10-CM | POA: Diagnosis not present

## 2016-08-29 DIAGNOSIS — M25622 Stiffness of left elbow, not elsewhere classified: Secondary | ICD-10-CM | POA: Diagnosis not present

## 2016-09-03 DIAGNOSIS — M25622 Stiffness of left elbow, not elsewhere classified: Secondary | ICD-10-CM | POA: Diagnosis not present

## 2016-09-03 DIAGNOSIS — M6281 Muscle weakness (generalized): Secondary | ICD-10-CM | POA: Diagnosis not present

## 2016-09-05 DIAGNOSIS — M25622 Stiffness of left elbow, not elsewhere classified: Secondary | ICD-10-CM | POA: Diagnosis not present

## 2016-09-05 DIAGNOSIS — M6281 Muscle weakness (generalized): Secondary | ICD-10-CM | POA: Diagnosis not present

## 2016-09-10 DIAGNOSIS — M25622 Stiffness of left elbow, not elsewhere classified: Secondary | ICD-10-CM | POA: Diagnosis not present

## 2016-09-10 DIAGNOSIS — M6281 Muscle weakness (generalized): Secondary | ICD-10-CM | POA: Diagnosis not present

## 2016-09-12 DIAGNOSIS — M6281 Muscle weakness (generalized): Secondary | ICD-10-CM | POA: Diagnosis not present

## 2016-09-12 DIAGNOSIS — M25622 Stiffness of left elbow, not elsewhere classified: Secondary | ICD-10-CM | POA: Diagnosis not present

## 2016-09-17 DIAGNOSIS — M25622 Stiffness of left elbow, not elsewhere classified: Secondary | ICD-10-CM | POA: Diagnosis not present

## 2016-09-17 DIAGNOSIS — S52022D Displaced fracture of olecranon process without intraarticular extension of left ulna, subsequent encounter for closed fracture with routine healing: Secondary | ICD-10-CM | POA: Diagnosis not present

## 2016-09-19 ENCOUNTER — Ambulatory Visit (INDEPENDENT_AMBULATORY_CARE_PROVIDER_SITE_OTHER): Payer: Medicare Other | Admitting: Internal Medicine

## 2016-09-19 ENCOUNTER — Encounter: Payer: Self-pay | Admitting: Internal Medicine

## 2016-09-19 VITALS — BP 128/78 | HR 72 | Resp 16 | Ht 67.0 in | Wt 160.0 lb

## 2016-09-19 DIAGNOSIS — E559 Vitamin D deficiency, unspecified: Secondary | ICD-10-CM | POA: Diagnosis not present

## 2016-09-19 DIAGNOSIS — Z1239 Encounter for other screening for malignant neoplasm of breast: Secondary | ICD-10-CM

## 2016-09-19 DIAGNOSIS — R5383 Other fatigue: Secondary | ICD-10-CM

## 2016-09-19 DIAGNOSIS — S52022D Displaced fracture of olecranon process without intraarticular extension of left ulna, subsequent encounter for closed fracture with routine healing: Secondary | ICD-10-CM | POA: Diagnosis not present

## 2016-09-19 DIAGNOSIS — E7800 Pure hypercholesterolemia, unspecified: Secondary | ICD-10-CM

## 2016-09-19 DIAGNOSIS — M25532 Pain in left wrist: Secondary | ICD-10-CM

## 2016-09-19 DIAGNOSIS — Z1231 Encounter for screening mammogram for malignant neoplasm of breast: Secondary | ICD-10-CM

## 2016-09-19 DIAGNOSIS — M85869 Other specified disorders of bone density and structure, unspecified lower leg: Secondary | ICD-10-CM

## 2016-09-19 DIAGNOSIS — I10 Essential (primary) hypertension: Secondary | ICD-10-CM

## 2016-09-19 DIAGNOSIS — Z0000 Encounter for general adult medical examination without abnormal findings: Secondary | ICD-10-CM

## 2016-09-19 LAB — CBC WITH DIFFERENTIAL/PLATELET
Basophils Absolute: 0 10*3/uL (ref 0.0–0.1)
Basophils Relative: 0.5 % (ref 0.0–3.0)
EOS PCT: 1.8 % (ref 0.0–5.0)
Eosinophils Absolute: 0.1 10*3/uL (ref 0.0–0.7)
HEMATOCRIT: 39.1 % (ref 36.0–46.0)
HEMOGLOBIN: 12.6 g/dL (ref 12.0–15.0)
LYMPHS PCT: 30.4 % (ref 12.0–46.0)
Lymphs Abs: 2.5 10*3/uL (ref 0.7–4.0)
MCHC: 32.2 g/dL (ref 30.0–36.0)
MCV: 87.1 fl (ref 78.0–100.0)
MONO ABS: 0.9 10*3/uL (ref 0.1–1.0)
Monocytes Relative: 11 % (ref 3.0–12.0)
Neutro Abs: 4.6 10*3/uL (ref 1.4–7.7)
Neutrophils Relative %: 56.3 % (ref 43.0–77.0)
Platelets: 172 10*3/uL (ref 150.0–400.0)
RBC: 4.49 Mil/uL (ref 3.87–5.11)
RDW: 16 % — ABNORMAL HIGH (ref 11.5–15.5)
WBC: 8.2 10*3/uL (ref 4.0–10.5)

## 2016-09-19 LAB — VITAMIN D 25 HYDROXY (VIT D DEFICIENCY, FRACTURES): VITD: 23.76 ng/mL — ABNORMAL LOW (ref 30.00–100.00)

## 2016-09-19 LAB — COMPREHENSIVE METABOLIC PANEL
ALBUMIN: 4 g/dL (ref 3.5–5.2)
ALT: 14 U/L (ref 0–35)
AST: 18 U/L (ref 0–37)
Alkaline Phosphatase: 54 U/L (ref 39–117)
BUN: 19 mg/dL (ref 6–23)
CALCIUM: 9.3 mg/dL (ref 8.4–10.5)
CHLORIDE: 103 meq/L (ref 96–112)
CO2: 30 mEq/L (ref 19–32)
CREATININE: 0.9 mg/dL (ref 0.40–1.20)
GFR: 63.33 mL/min (ref >60.00)
Glucose, Bld: 111 mg/dL — ABNORMAL HIGH (ref 70–99)
POTASSIUM: 4.3 meq/L (ref 3.5–5.1)
Sodium: 136 mEq/L (ref 135–145)
Total Bilirubin: 0.4 mg/dL (ref 0.2–1.2)
Total Protein: 6.5 g/dL (ref 6.0–8.3)

## 2016-09-19 LAB — LIPID PANEL
CHOL/HDL RATIO: 5
CHOLESTEROL: 200 mg/dL (ref 0–200)
HDL: 42.5 mg/dL (ref >39.00)
NonHDL: 157.02
TRIGLYCERIDES: 203 mg/dL — AB (ref 0.0–149.0)
VLDL: 40.6 mg/dL — AB (ref 0.0–40.0)

## 2016-09-19 LAB — TSH: TSH: 1.18 u[IU]/mL (ref 0.35–4.50)

## 2016-09-19 LAB — LDL CHOLESTEROL, DIRECT: LDL DIRECT: 126 mg/dL

## 2016-09-19 NOTE — Progress Notes (Signed)
Patient ID: LIAH STARBUCK, female    DOB: 21-Jun-1930  Age: 83 y.o. MRN: [id number]  The patient is here for a routine physical  examination and management of other chronic and acute problems.   cottage cheese and pineapple  For lunch today   The risk factors are reflected in the social history.  The roster of all physicians providing medical care to patient - is listed in the Snapshot section of the chart.  Activities of daily living:  The patient is 100% independent in all ADLs: dressing, toileting, feeding as well as independent mobility  Home safety : The patient has smoke detectors in the home. They wear seatbelts.  There are no firearms at home. There is no violence in the home.   There is no risks for hepatitis, STDs or HIV. There is no   history of blood transfusion. They have no travel history to infectious disease endemic areas of the world.  The patient has seen their dentist in the last six month. They have seen their eye doctor in the last year. They admit to slight hearing difficulty with regard to whispered voices and some television programs.  They have deferred audiologic testing in the last year.  They do not  have excessive sun exposure. Discussed the need for sun protection: hats, long sleeves and use of sunscreen if there is significant sun exposure.   Diet: the importance of a healthy diet is discussed. They do have a healthy diet.  The benefits of regular aerobic exercise were discussed. She walks 4 times per week ,  20 minutes.   Depression screen: there are no signs or vegative symptoms of depression- irritability, change in appetite, anhedonia, sadness/tearfullness.  Cognitive assessment: the patient manages all their financial and personal affairs and is actively engaged. They could relate day,date,year and events; recalled 2/3 objects at 3 minutes; performed clock-face test normally.  The following portions of the patient's history were reviewed and updated as  appropriate: allergies, current medications, past family history, past medical history,  past surgical history, past social history  and problem list.  Visual acuity was not assessed per patient preference since she has regular follow up with her ophthalmologist. Hearing and body mass index were assessed and reviewed.   During the course of the visit the patient was educated and counseled about appropriate screening and preventive services including : fall prevention , diabetes screening, nutrition counseling, colorectal cancer screening, and recommended immunizations.    CC: The primary encounter diagnosis was Medicare annual wellness visit, subsequent. Diagnoses of Vitamin D deficiency, Pure hypercholesterolemia, Breast cancer screening, Fatigue, unspecified type, Essential hypertension, Olecranon fracture, left, closed, with routine healing, subsequent encounter, Osteopenia of lower leg, unspecified laterality, and Pain in left wrist were also pertinent to this visit.  She sustained a closed avulsion fracture of her left olecranon on  Dec 16th when she fell at home. She was placed in a posterior long arm splint in flexion,  Underwent open reduction and internal fixation Then casted for 3 weeks,  Then transitioned to a Bledsoe brace at 90 degrees which apparently resulted in sustained pain and decreased mobility of her left wrist .  She is having persistent moderate to severe pain in the wrist .   Took 2 Aleve today, using it sparingly    BP up today. lsinopril dose was increased at last visit  . Home machine runs 10 pts higher than what we get here. Home readings have been 120 and 130/80 .  Cat scratched her 5th finger yesterday during a grooming session  .   History Smitha has a past medical history of Arthritis; GERD (gastroesophageal reflux disease); Heart murmur; History of shingles (Dec 2013); Hypertension; Hypothyroidism; Osteoarthritis of left shoulder (07/14/2012); and Polymyalgia  rheumatica ([place]).   She has a past surgical history that includes Foot surgery; Arthroscopic rotator cuff repair; Cataract extraction w/ intraocular lens implant; Skin cancer excision; Facelift; Total shoulder arthroplasty (07/14/2012); Joint replacement (Left, Dec 2013); and Cataract extraction w/PHACO (Left, 03/28/2016).   Her family history includes Breast cancer (age of onset: 1) in her daughter; Cancer in her daughter and son.She reports that she quit smoking about 44 years ago. Her smoking use included Cigarettes. She has never used smokeless tobacco. She reports that she drinks alcohol. She reports that she does not use drugs.  Outpatient Medications Prior to Visit  Medication Sig Dispense Refill  . ALPRAZolam (XANAX) 0.25 MG tablet Take 1 tablet (0.25 mg total) by mouth at bedtime as needed for anxiety. 30 tablet 2  . aspirin 81 MG tablet Take 81 mg by mouth daily.    . Cyanocobalamin (VITAMIN B 12) 100 MCG LOZG Take 1 tablet by mouth daily.     [name] levothyroxine (SYNTHROID, LEVOTHROID) 88 MCG tablet TAKE 1 TABLET BY MOUTH ONCE DAILY BEFOREBREAKFAST 90 tablet 3  . lisinopril (PRINIVIL,ZESTRIL) 20 MG tablet Take 1 tablet (20 mg total) by mouth daily. Keep on file for future refills 90 tablet 1  . omeprazole (PRILOSEC) 20 MG capsule TAKE 1 CAPSULE BY MOUTH EVERY MORNING, PRIOR TO BREAKFAST. 90 capsule 1  . Polyethyl Glycol-Propyl Glycol (SYSTANE OP) Apply 1-2 drops to eye daily as needed (dryness).    . raloxifene (EVISTA) 60 MG tablet TAKE 1 TABLET BY MOUTH ONCE DAILY 30 tablet 1  . RESTASIS 0.05 % ophthalmic emulsion Place 1 drop into both eyes 2 (two) times daily.     . furosemide (LASIX) 20 MG tablet Take 1 tablet (20 mg total) by mouth daily. As needed for fluid retention (Patient not taking: Reported on 09/19/2016) 30 tablet 3  . potassium chloride SA (K-DUR,KLOR-CON) 20 MEQ tablet Take 1 tablet (20 mEq total) by mouth daily. With fluid pill only (Patient not taking: Reported on 09/19/2016)  30 tablet 3   No facility-administered medications prior to visit.     Review of Systems   Patient denies headache, fevers, malaise, unintentional weight loss, skin rash, eye pain, sinus congestion and sinus pain, sore throat, dysphagia,  hemoptysis , cough, dyspnea, wheezing, chest pain, palpitations, orthopnea, edema, abdominal pain, nausea, melena, diarrhea, constipation, flank pain, dysuria, hematuria, urinary  Frequency, nocturia, numbness, tingling, seizures,  Focal weakness, Loss of consciousness,  Tremor, insomnia, depression, anxiety, and suicidal ideation.      Objective:  BP 128/78   Pulse 72   Resp 16   Ht 5\' 7"  (1.702 m)   Wt 160 lb (72.6 kg)   SpO2 97%   BMI 25.06 kg/m   Physical Exam   General appearance: alert, cooperative and appears stated age Head: Normocephalic, without obvious abnormality, atraumatic Eyes: conjunctivae/corneas clear. PERRL, EOM's intact. Fundi benign. Ears: normal TM's and external ear canals both ears Nose: Nares normal. Septum midline. Mucosa normal. No drainage or sinus tenderness. Throat: lips, mucosa, and tongue normal; teeth and gums normal Neck: no adenopathy, no carotid bruit, no JVD, supple, symmetrical, trachea midline and thyroid not enlarged, symmetric, no tenderness/mass/nodules Lungs: clear to auscultation bilaterally Breasts: normal appearance, no masses or tenderness Heart:  regular rate and rhythm, S1, S2 normal, no murmur, click, rub or gallop Abdomen: soft, non-tender; bowel sounds normal; no masses,  no organomegaly Extremities: extremities normal, atraumatic, no cyanosis or edema Pulses: 2+ and symmetric Skin: Skin color, texture, turgor normal. No rashes or lesions Neurologic: Alert and oriented X 3, normal strength and tone. Normal symmetric reflexes. Normal coordination and gait.      Assessment & Plan:   Problem List Items Addressed This Visit    Hypertension    Well controlled on current regimen. Renal  function stable, no changes today.  Lab Results  Component Value Date   CREATININE 0.90 09/19/2016   Lab Results  Component Value Date   NA 136 09/19/2016   K 4.3 09/19/2016   CL 103 09/19/2016   CO2 30 09/19/2016         Medicare annual wellness visit, subsequent - Primary    Annual Medicare wellness  exam was done as well as a comprehensive physical exam and management of acute and chronic conditions .  During the course of the visit the patient was educated and counseled about appropriate screening and preventive services including : fall prevention , diabetes screening, nutrition counseling, colorectal cancer screening, and recommended immunizations.  Printed recommendations for health maintenance screenings was given.   Lab Results  Component Value Date   CHOL 200 09/19/2016   HDL 42.50 09/19/2016   LDLCALC 129 (H) 10/31/2015   LDLDIRECT 126.0 09/19/2016   TRIG 203.0 (H) 09/19/2016   CHOLHDL 5 09/19/2016         Olecranon fracture, left, closed, with routine healing, subsequent encounter    She has no persistent elbow pain 8 weeks post ORIF.      Osteopenia    Managed with [facility] for T scores -2.1 2015      Pain in left wrist    Secondary to use of Bledsoe brace ,  Unclear if she has a compression neuropathy of severe arthritis from nonuse.  No further workup is planned , per patient's preference.         Other Visit Diagnoses    Vitamin D deficiency       Relevant Orders   VITAMIN D 25 Hydroxy (Vit-D Deficiency, Fractures) (Completed)   Pure hypercholesterolemia       Relevant Orders   Lipid panel (Completed)   LDL cholesterol, direct (Completed)   Breast cancer screening       Relevant Orders   MM SCREENING BREAST TOMO BILATERAL   Fatigue, unspecified type       Relevant Orders   Comprehensive metabolic panel (Completed)   TSH (Completed)   CBC with Differential/Platelet (Completed)      I am having Ms. Weinreb maintain her RESTASIS, Vitamin B 12,  omeprazole, aspirin, ALPRAZolam, lisinopril, furosemide, potassium chloride SA, Polyethyl Glycol-Propyl Glycol (SYSTANE OP), raloxifene, and levothyroxine.  No orders of the defined types were placed in this encounter.   There are no discontinued medications.  Follow-up: No Follow-up on file.   [name], MD

## 2016-09-19 NOTE — Progress Notes (Signed)
Pre visit review using our clinic review tool, if applicable. No additional management support is needed unless otherwise documented below in the visit note. 

## 2016-09-19 NOTE — Patient Instructions (Addendum)
The ShingRx vaccine will be available in about 6 months and IS ADVISED for all interested adults over 50 to prevent shingles.  You may be able to get it sooner at your pharmacy    I have ordered your annual mammogram .  It is due in April  If the labs are abnormal today because of lunch,  I will let you repeat them!     Continue to check your blood pressure 1-2/week,  If the readings become  Consistently  Greater than  130/80,  Let me know and we will increase the lisinopril dose    Health Maintenance for Postmenopausal Women Introduction Menopause is a normal process in which your reproductive ability comes to an end. This process happens gradually over a span of months to years, usually between the ages of 5 and 66. Menopause is complete when you have missed 12 consecutive menstrual periods. It is important to talk with your health care provider about some of the most common conditions that affect postmenopausal women, such as heart disease, cancer, and bone loss (osteoporosis). Adopting a healthy lifestyle and getting preventive care can help to promote your health and wellness. Those actions can also lower your chances of developing some of these common conditions. What should I know about menopause? During menopause, you may experience a number of symptoms, such as:  Moderate-to-severe hot flashes.  Night sweats.  Decrease in sex drive.  Mood swings.  Headaches.  Tiredness.  Irritability.  Memory problems.  Insomnia. Choosing to treat or not to treat menopausal changes is an individual decision that you make with your health care provider. What should I know about hormone replacement therapy and supplements? Hormone therapy products are effective for treating symptoms that are associated with menopause, such as hot flashes and night sweats. Hormone replacement carries certain risks, especially as you become older. If you are thinking about using estrogen or estrogen with  progestin treatments, discuss the benefits and risks with your health care provider. What should I know about heart disease and stroke? Heart disease, heart attack, and stroke become more likely as you age. This may be due, in part, to the hormonal changes that your body experiences during menopause. These can affect how your body processes dietary fats, triglycerides, and cholesterol. Heart attack and stroke are both medical emergencies. There are many things that you can do to help prevent heart disease and stroke:  Have your blood pressure checked at least every 1-2 years. High blood pressure causes heart disease and increases the risk of stroke.  If you are 68-56 years old, ask your health care provider if you should take aspirin to prevent a heart attack or a stroke.  Do not use any tobacco products, including cigarettes, chewing tobacco, or electronic cigarettes. If you need help quitting, ask your health care provider.  It is important to eat a healthy diet and maintain a healthy weight.  Be sure to include plenty of vegetables, fruits, low-fat dairy products, and lean protein.  Avoid eating foods that are high in solid fats, added sugars, or salt (sodium).  Get regular exercise. This is one of the most important things that you can do for your health.  Try to exercise for at least 150 minutes each week. The type of exercise that you do should increase your heart rate and make you sweat. This is known as moderate-intensity exercise.  Try to do strengthening exercises at least twice each week. Do these in addition to the moderate-intensity exercise.  Know your numbers.Ask your health care provider to check your cholesterol and your blood glucose. Continue to have your blood tested as directed by your health care provider. What should I know about cancer screening? There are several types of cancer. Take the following steps to reduce your risk and to catch any cancer development as  early as possible. Breast Cancer  Practice breast self-awareness.  This means understanding how your breasts normally appear and feel.  It also means doing regular breast self-exams. Let your health care provider know about any changes, no matter how small.  If you are 11 or older, have a clinician do a breast exam (clinical breast exam or CBE) every year. Depending on your age, family history, and medical history, it may be recommended that you also have a yearly breast X-ray (mammogram).  If you have a family history of breast cancer, talk with your health care provider about genetic screening.  If you are at high risk for breast cancer, talk with your health care provider about having an MRI and a mammogram every year.  Breast cancer (BRCA) gene test is recommended for women who have family members with BRCA-related cancers. Results of the assessment will determine the need for genetic counseling and BRCA1 and for BRCA2 testing. BRCA-related cancers include these types:  Breast. This occurs in males or females.  Ovarian.  Tubal. This may also be called fallopian tube cancer.  Cancer of the abdominal or pelvic lining (peritoneal cancer).  Prostate.  Pancreatic. Cervical, Uterine, and Ovarian Cancer  Your health care provider may recommend that you be screened regularly for cancer of the pelvic organs. These include your ovaries, uterus, and vagina. This screening involves a pelvic exam, which includes checking for microscopic changes to the surface of your cervix (Pap test).  For women ages 21-65, health care providers may recommend a pelvic exam and a Pap test every three years. For women ages 31-65, they may recommend the Pap test and pelvic exam, combined with testing for human papilloma virus (HPV), every five years. Some types of HPV increase your risk of cervical cancer. Testing for HPV may also be done on women of any age who have unclear Pap test results.  Other health care  providers may not recommend any screening for nonpregnant women who are considered low risk for pelvic cancer and have no symptoms. Ask your health care provider if a screening pelvic exam is right for you.  If you have had past treatment for cervical cancer or a condition that could lead to cancer, you need Pap tests and screening for cancer for at least 20 years after your treatment. If Pap tests have been discontinued for you, your risk factors (such as having a new sexual partner) need to be reassessed to determine if you should start having screenings again. Some women have medical problems that increase the chance of getting cervical cancer. In these cases, your health care provider may recommend that you have screening and Pap tests more often.  If you have a family history of uterine cancer or ovarian cancer, talk with your health care provider about genetic screening.  If you have vaginal bleeding after reaching menopause, tell your health care provider.  There are currently no reliable tests available to screen for ovarian cancer. Lung Cancer  Lung cancer screening is recommended for adults 64-25 years old who are at high risk for lung cancer because of a history of smoking. A yearly low-dose CT scan of the lungs  is recommended if you:  Currently smoke.  Have a history of at least 30 pack-years of smoking and you currently smoke or have quit within the past 15 years. A pack-year is smoking an average of one pack of cigarettes per day for one year. Yearly screening should:  Continue until it has been 15 years since you quit.  Stop if you develop a health problem that would prevent you from having lung cancer treatment. Colorectal Cancer  This type of cancer can be detected and can often be prevented.  Routine colorectal cancer screening usually begins at age 51 and continues through age 34.  If you have risk factors for colon cancer, your health care provider may recommend that you  be screened at an earlier age.  If you have a family history of colorectal cancer, talk with your health care provider about genetic screening.  Your health care provider may also recommend using home test kits to check for hidden blood in your stool.  A small camera at the end of a tube can be used to examine your colon directly (sigmoidoscopy or colonoscopy). This is done to check for the earliest forms of colorectal cancer.  Direct examination of the colon should be repeated every 5-10 years until age 38. However, if early forms of precancerous polyps or small growths are found or if you have a family history or genetic risk for colorectal cancer, you may need to be screened more often. Skin Cancer  Check your skin from head to toe regularly.  Monitor any moles. Be sure to tell your health care provider:  About any new moles or changes in moles, especially if there is a change in a mole's shape or color.  If you have a mole that is larger than the size of a pencil eraser.  If any of your family members has a history of skin cancer, especially at a young age, talk with your health care provider about genetic screening.  Always use sunscreen. Apply sunscreen liberally and repeatedly throughout the day.  Whenever you are outside, protect yourself by wearing long sleeves, pants, a wide-brimmed hat, and sunglasses. What should I know about osteoporosis? Osteoporosis is a condition in which bone destruction happens more quickly than new bone creation. After menopause, you may be at an increased risk for osteoporosis. To help prevent osteoporosis or the bone fractures that can happen because of osteoporosis, the following is recommended:  If you are 37-73 years old, get at least 1,000 mg of calcium and at least 600 mg of vitamin D per day.  If you are older than age 11 but younger than age 6, get at least 1,200 mg of calcium and at least 600 mg of vitamin D per day.  If you are older than  age 45, get at least 1,200 mg of calcium and at least 800 mg of vitamin D per day. Smoking and excessive alcohol intake increase the risk of osteoporosis. Eat foods that are rich in calcium and vitamin D, and do weight-bearing exercises several times each week as directed by your health care provider. What should I know about how menopause affects my mental health? Depression may occur at any age, but it is more common as you become older. Common symptoms of depression include:  Low or sad mood.  Changes in sleep patterns.  Changes in appetite or eating patterns.  Feeling an overall lack of motivation or enjoyment of activities that you previously enjoyed.  Frequent crying spells. Talk  with your health care provider if you think that you are experiencing depression. What should I know about immunizations? It is important that you get and maintain your immunizations. These include:  Tetanus, diphtheria, and pertussis (Tdap) booster vaccine.  Influenza every year before the flu season begins.  Pneumonia vaccine.  Shingles vaccine. Your health care provider may also recommend other immunizations. This information is not intended to replace advice given to you by your health care provider. Make sure you discuss any questions you have with your health care provider. Document Released: 09/07/2005 Document Revised: 02/03/2016 Document Reviewed: 04/19/2015  2017 [employer]

## 2016-09-20 DIAGNOSIS — S52022D Displaced fracture of olecranon process without intraarticular extension of left ulna, subsequent encounter for closed fracture with routine healing: Secondary | ICD-10-CM | POA: Insufficient documentation

## 2016-09-20 DIAGNOSIS — Z1231 Encounter for screening mammogram for malignant neoplasm of breast: Secondary | ICD-10-CM | POA: Insufficient documentation

## 2016-09-20 DIAGNOSIS — M25532 Pain in left wrist: Secondary | ICD-10-CM | POA: Insufficient documentation

## 2016-09-20 NOTE — Assessment & Plan Note (Addendum)
Managed with [facility] for T scores -2.1 2015

## 2016-09-20 NOTE — Assessment & Plan Note (Signed)
Well controlled on current regimen. Renal function stable, no changes today.  Lab Results  Component Value Date   CREATININE 0.90 09/19/2016   Lab Results  Component Value Date   NA 136 09/19/2016   K 4.3 09/19/2016   CL 103 09/19/2016   CO2 30 09/19/2016

## 2016-09-20 NOTE — Assessment & Plan Note (Signed)
Secondary to use of Bledsoe brace ,  Unclear if she has a compression neuropathy of severe arthritis from nonuse.  No further workup is planned , per patient's preference.

## 2016-09-20 NOTE — Assessment & Plan Note (Signed)
Thyroid function is WNL on current dose.  No current changes needed.   Lab Results  Component Value Date   TSH 1.18 09/19/2016

## 2016-09-20 NOTE — Assessment & Plan Note (Addendum)
Annual Medicare wellness  exam was done as well as a comprehensive physical exam and management of acute and chronic conditions .  During the course of the visit the patient was educated and counseled about appropriate screening and preventive services including : fall prevention , diabetes screening, nutrition counseling, colorectal cancer screening, and recommended immunizations.  Printed recommendations for health maintenance screenings was given.   Lab Results  Component Value Date   CHOL 200 09/19/2016   HDL 42.50 09/19/2016   LDLCALC 129 (H) 10/31/2015   LDLDIRECT 126.0 09/19/2016   TRIG 203.0 (H) 09/19/2016   CHOLHDL 5 09/19/2016

## 2016-09-20 NOTE — Assessment & Plan Note (Signed)
She has no persistent elbow pain 8 weeks post ORIF.

## 2016-09-21 ENCOUNTER — Encounter: Payer: Self-pay | Admitting: Internal Medicine

## 2016-09-28 ENCOUNTER — Other Ambulatory Visit: Payer: Self-pay | Admitting: Internal Medicine

## 2016-10-08 ENCOUNTER — Other Ambulatory Visit: Payer: Self-pay | Admitting: Internal Medicine

## 2016-10-09 NOTE — Telephone Encounter (Signed)
Pharmacy called and stated that pt is out of medication. Please advise, thank you!  [place], [place].

## 2016-10-29 DIAGNOSIS — M654 Radial styloid tenosynovitis [de Quervain]: Secondary | ICD-10-CM | POA: Diagnosis not present

## 2016-10-29 DIAGNOSIS — S52022D Displaced fracture of olecranon process without intraarticular extension of left ulna, subsequent encounter for closed fracture with routine healing: Secondary | ICD-10-CM | POA: Diagnosis not present

## 2016-11-01 ENCOUNTER — Ambulatory Visit (INDEPENDENT_AMBULATORY_CARE_PROVIDER_SITE_OTHER): Payer: Medicare Other

## 2016-11-01 VITALS — BP 136/72 | HR 69 | Temp 97.9°F | Resp 14 | Ht 66.0 in | Wt 160.8 lb

## 2016-11-01 DIAGNOSIS — Z0000 Encounter for general adult medical examination without abnormal findings: Secondary | ICD-10-CM | POA: Diagnosis not present

## 2016-11-01 NOTE — Progress Notes (Addendum)
Subjective:   Kathryn Wise is a 83 y.o. female who presents for Medicare Annual (Subsequent) preventive examination.  Review of Systems:  No ROS.  Medicare Wellness Visit. Cardiac Risk Factors include: advanced age (>78men, >40 women);hypertension     Objective:     Vitals: BP 136/72 (BP Location: Right Arm, Patient Position: Sitting, Cuff Size: Normal)   Pulse 69   Temp 97.9 F (36.6 C) (Oral)   Resp 14   Ht 5\' 6"  (1.676 m)   Wt 160 lb 12.8 oz (72.9 kg)   SpO2 97%   BMI 25.95 kg/m   Body mass index is 25.95 kg/m.   Tobacco History  Smoking Status  . Former Smoker  . Types: Cigarettes  . Quit date: 07/30/1972  Smokeless Tobacco  . Never Used    Comment: smoked for only a few months     Counseling given: Not Answered   Past Medical History:  Diagnosis Date  . Arthritis   . GERD (gastroesophageal reflux disease)   . Heart murmur    has had years and years  . History of shingles Dec 2013   treated with steroids , post op from shoulder surgery  . Hypertension   . Hypothyroidism   . Osteoarthritis of left shoulder 07/14/2012  . Polymyalgia rheumatica (HCC)    Past Surgical History:  Procedure Laterality Date  . ARTHOSCOPIC ROTAOR CUFF REPAIR     LEFT  10+  YEARS    . CATARACT EXTRACTION W/ INTRAOCULAR LENS IMPLANT     RIGHT EYE  . CATARACT EXTRACTION W/PHACO Left 03/28/2016   Procedure: CATARACT EXTRACTION PHACO AND INTRAOCULAR LENS PLACEMENT (IOC);  Surgeon: Leandrew Koyanagi, MD;  Location: [place];  Service: Ophthalmology;  Laterality: Left;  TORIC  . FACELIFT    . FOOT SURGERY     rt foot   TUMOR REMOVED  . JOINT REPLACEMENT Left Dec 2013   shoulder  . SKIN CANCER EXCISION    . TOTAL SHOULDER ARTHROPLASTY  07/14/2012   Procedure: TOTAL SHOULDER ARTHROPLASTY;  Surgeon: Johnny Bridge, MD;  Location: [place];  Service: Orthopedics;  Laterality: Left;   Family History  Problem Relation Age of Onset  . Cancer Daughter     Breast  .  Breast cancer Daughter 53  . Cancer Son    History  Sexual Activity  . Sexual activity: No    Outpatient Encounter Prescriptions as of 11/01/2016  Medication Sig  . aspirin 81 MG tablet Take 81 mg by mouth daily.  . Cholecalciferol (VITAMIN D-3) 1000 units CAPS Take 1 capsule by mouth daily.  . Cyanocobalamin (VITAMIN B 12) 100 MCG LOZG Take 1 tablet by mouth daily.   [name] levothyroxine (SYNTHROID, LEVOTHROID) 88 MCG tablet TAKE 1 TABLET BY MOUTH ONCE DAILY BEFOREBREAKFAST  . lisinopril (PRINIVIL,ZESTRIL) 20 MG tablet TAKE 1 TABLET BY MOUTH ONCE DAILY  . omeprazole (PRILOSEC) 20 MG capsule TAKE 1 CAPSULE BY MOUTH EVERY MORNING, PRIOR TO BREAKFAST.  [name] Polyethyl Glycol-Propyl Glycol (SYSTANE OP) Apply 1-2 drops to eye daily as needed (dryness).  . raloxifene (EVISTA) 60 MG tablet TAKE 1 TABLET BY MOUTH ONCE DAILY  . RESTASIS 0.05 % ophthalmic emulsion Place 1 drop into both eyes 2 (two) times daily.   . [DISCONTINUED] potassium chloride SA (K-DUR,KLOR-CON) 20 MEQ tablet Take 1 tablet (20 mEq total) by mouth daily. With fluid pill only  . ALPRAZolam (XANAX) 0.25 MG tablet Take 1 tablet (0.25 mg total) by mouth at bedtime as needed  for anxiety. (Patient not taking: Reported on 11/01/2016)  . furosemide (LASIX) 20 MG tablet Take 1 tablet (20 mg total) by mouth daily. As needed for fluid retention (Patient not taking: Reported on 11/01/2016)   No facility-administered encounter medications on file as of 11/01/2016.     Activities of Daily Living In your present state of health, do you have any difficulty performing the following activities: 11/01/2016 03/28/2016  Hearing? N N  Vision? N N  Difficulty concentrating or making decisions? N N  Walking or climbing stairs? N N  Dressing or bathing? N N  Doing errands, shopping? N -  Preparing Food and eating ? N -  Using the Toilet? N -  In the past six months, have you accidently leaked urine? N -  Do you have problems with loss of bowel control? N -    Managing your Medications? N -  Managing your Finances? N -  Housekeeping or managing your Housekeeping? N -  Some recent data might be hidden    Patient Care Team: [name], MD as PCP - General (Internal Medicine)    Assessment:    This is a routine wellness examination for [place]. The goal of the wellness visit is to assist the patient how to close the gaps in care and create a preventative care plan for the patient.   Taking calcium VIT D as appropriate/Osteoporosis risk reviewed.  Medications reviewed; taking without issues or barriers.  Safety issues reviewed; smoke detectors in the home. Firearms locked up in the home. Wears seatbelts when driving or riding with others. Patient does wear sunscreen or protective clothing when in direct sunlight. No violence in the home.  Patient is alert, normal appearance, oriented to person/place/and time. Correctly identified the president of the [place], recall of 2/3 words, and performing simple calculations.  Patient displays appropriate judgement and can read correct time from watch face.  No new identified risk were noted.  No failures at ADL's or IADL's.   BMI- discussed the importance of a healthy diet, water intake and exercise. Educational material provided.   Daily fluid intake: 2 cups of caffeine, 6 cups of water  HTN- followed by PCP.  Dental- every six months.  Eye- Visual acuity not assessed per patient preference since they have regular follow up with the ophthalmologist.  Wears corrective lenses.  Sleep patterns- Sleeps 7 hours at night.  Wakes feeling rested.  Health maintenance gaps- closed.  Patient Concerns: None at this time. Follow up with PCP as needed.  Exercise Activities and Dietary recommendations Current Exercise Habits: Structured exercise class, Time (Minutes): 60, Frequency (Times/Week): 5, Weekly Exercise (Minutes/Week): 300, Intensity: Moderate  Goals    . Healthy Lifestyle           Stay active Stay hydrated Low carb foods      Fall Risk Fall Risk  11/01/2016 03/30/2016 10/08/2014 09/10/2013  Falls in the past year? No No No No   Depression Screen PHQ 2/9 Scores 11/01/2016 10/08/2014 09/10/2013  PHQ - 2 Score 0 0 0     Cognitive Function     6CIT Screen 11/01/2016  What Year? 0 points  What month? 0 points  What time? 0 points  Count back from 20 0 points  Months in reverse 0 points  Repeat phrase 0 points  Total Score 0    Immunization History  Administered Date(s) Administered  . Influenza Split 07/17/2012, 05/22/2013  . Influenza, High Dose Seasonal PF 04/28/2015, 05/30/2016  .  Pneumococcal Conjugate-13 09/10/2013  . Pneumococcal Polysaccharide-23 09/10/2008, 10/08/2014  . Tdap 10/07/2013   Screening Tests Health Maintenance  Topic Date Due  . INFLUENZA VACCINE  02/27/2017  . TETANUS/TDAP  10/08/2023  . DEXA SCAN  Completed  . PNA vac Low Risk Adult  Completed      Plan:    End of life planning; Advance aging; Advanced directives discussed. Copy of current HCPOA/Living Will requested.    Medicare Attestation I have personally reviewed: The patient's medical and social history Their use of alcohol, tobacco or illicit drugs Their current medications and supplements The patient's functional ability including ADLs,fall risks, home safety risks, cognitive, and hearing and visual impairment Diet and physical activities Evidence for depression   The patient's weight, height, BMI, and visual acuity have been recorded in the chart.  I have made referrals and provided education to the patient based on review of the above and I have provided the patient with a written personalized care plan for preventive services.    During the course of the visit the patient was educated and counseled about the following appropriate screening and preventive services:   Vaccines to include Pneumoccal, Influenza, Hepatitis B, Td, Zostavax, HCV  Colorectal cancer  screening-UTD  Bone density screening-UTD  Glaucoma screening-annual eye exam  Mammography-UTD  Nutrition counseling   Patient Instructions (the written plan) was given to the patient.   Varney Biles, LPN  1/0/3159   Reviewed above information.  Agree with plan.   Dr Nicki Reaper

## 2016-11-01 NOTE — Patient Instructions (Addendum)
  Ms. Givhan , Thank you for taking time to come for your Medicare Wellness Visit. I appreciate your ongoing commitment to your health goals. Please review the following plan we discussed and let me know if I can assist you in the future.   Follow up with Dr. Derrel Nip as needed.    Bring a copy of your [place] and/or Living Will to be scanned into chart.  Have a great day!  These are the goals we discussed: Goals    . Healthy Lifestyle          Stay active Stay hydrated Low carb foods       This is a list of the screening recommended for you and due dates:  Health Maintenance  Topic Date Due  . Flu Shot  02/27/2017  . Tetanus Vaccine  10/08/2023  . DEXA scan (bone density measurement)  Completed  . Pneumonia vaccines  Completed

## 2016-11-06 DIAGNOSIS — Z961 Presence of intraocular lens: Secondary | ICD-10-CM | POA: Diagnosis not present

## 2016-11-20 ENCOUNTER — Ambulatory Visit
Admission: RE | Admit: 2016-11-20 | Discharge: 2016-11-20 | Disposition: A | Discharge status: Home / Self Care | Payer: Medicare Other | Source: Ambulatory Visit | Attending: Internal Medicine | Admitting: Internal Medicine

## 2016-11-20 DIAGNOSIS — Z1239 Encounter for other screening for malignant neoplasm of breast: Secondary | ICD-10-CM

## 2016-11-20 DIAGNOSIS — Z1231 Encounter for screening mammogram for malignant neoplasm of breast: Secondary | ICD-10-CM | POA: Insufficient documentation

## 2017-01-21 DIAGNOSIS — S52022D Displaced fracture of olecranon process without intraarticular extension of left ulna, subsequent encounter for closed fracture with routine healing: Secondary | ICD-10-CM | POA: Diagnosis not present

## 2017-01-30 ENCOUNTER — Other Ambulatory Visit: Payer: Self-pay | Admitting: Internal Medicine

## 2017-02-13 ENCOUNTER — Other Ambulatory Visit: Payer: Self-pay | Admitting: Internal Medicine

## 2017-02-18 DIAGNOSIS — S60221A Contusion of right hand, initial encounter: Secondary | ICD-10-CM | POA: Diagnosis not present

## 2017-02-18 DIAGNOSIS — S52022D Displaced fracture of olecranon process without intraarticular extension of left ulna, subsequent encounter for closed fracture with routine healing: Secondary | ICD-10-CM | POA: Diagnosis not present

## 2017-02-23 DIAGNOSIS — S61439A Puncture wound without foreign body of unspecified hand, initial encounter: Secondary | ICD-10-CM | POA: Diagnosis not present

## 2017-02-23 DIAGNOSIS — W5511XA Bitten by horse, initial encounter: Secondary | ICD-10-CM | POA: Diagnosis not present

## 2017-02-23 DIAGNOSIS — W5501XA Bitten by cat, initial encounter: Secondary | ICD-10-CM | POA: Diagnosis not present

## 2017-02-23 DIAGNOSIS — S51839A Puncture wound without foreign body of unspecified forearm, initial encounter: Secondary | ICD-10-CM | POA: Diagnosis not present

## 2017-02-25 DIAGNOSIS — S51839D Puncture wound without foreign body of unspecified forearm, subsequent encounter: Secondary | ICD-10-CM | POA: Diagnosis not present

## 2017-02-25 DIAGNOSIS — W5501XD Bitten by cat, subsequent encounter: Secondary | ICD-10-CM | POA: Diagnosis not present

## 2017-02-25 DIAGNOSIS — S61439D Puncture wound without foreign body of unspecified hand, subsequent encounter: Secondary | ICD-10-CM | POA: Diagnosis not present

## 2017-03-27 ENCOUNTER — Ambulatory Visit (INDEPENDENT_AMBULATORY_CARE_PROVIDER_SITE_OTHER): Payer: Medicare Other | Admitting: Internal Medicine

## 2017-03-27 ENCOUNTER — Encounter: Payer: Self-pay | Admitting: Internal Medicine

## 2017-03-27 VITALS — BP 130/70 | HR 71 | Temp 98.2°F | Resp 15 | Ht 66.0 in | Wt 151.4 lb

## 2017-03-27 DIAGNOSIS — F325 Major depressive disorder, single episode, in full remission: Secondary | ICD-10-CM | POA: Diagnosis not present

## 2017-03-27 DIAGNOSIS — Z780 Asymptomatic menopausal state: Secondary | ICD-10-CM

## 2017-03-27 DIAGNOSIS — E785 Hyperlipidemia, unspecified: Secondary | ICD-10-CM

## 2017-03-27 DIAGNOSIS — M353 Polymyalgia rheumatica: Secondary | ICD-10-CM | POA: Diagnosis not present

## 2017-03-27 DIAGNOSIS — I10 Essential (primary) hypertension: Secondary | ICD-10-CM

## 2017-03-27 DIAGNOSIS — E039 Hypothyroidism, unspecified: Secondary | ICD-10-CM | POA: Diagnosis not present

## 2017-03-27 DIAGNOSIS — E559 Vitamin D deficiency, unspecified: Secondary | ICD-10-CM

## 2017-03-27 DIAGNOSIS — M85869 Other specified disorders of bone density and structure, unspecified lower leg: Secondary | ICD-10-CM

## 2017-03-27 LAB — COMPREHENSIVE METABOLIC PANEL
ALK PHOS: 40 U/L (ref 39–117)
ALT: 12 U/L (ref 0–35)
AST: 19 U/L (ref 0–37)
Albumin: 4 g/dL (ref 3.5–5.2)
BUN: 23 mg/dL (ref 6–23)
CALCIUM: 9.7 mg/dL (ref 8.4–10.5)
CO2: 31 mEq/L (ref 19–32)
Chloride: 102 mEq/L (ref 96–112)
Creatinine, Ser: 1.04 mg/dL (ref 0.40–1.20)
GFR: 53.53 mL/min — ABNORMAL LOW (ref >60.00)
GLUCOSE: 107 mg/dL — AB (ref 70–99)
POTASSIUM: 4.5 meq/L (ref 3.5–5.1)
Sodium: 138 mEq/L (ref 135–145)
TOTAL PROTEIN: 7.2 g/dL (ref 6.0–8.3)
Total Bilirubin: 0.6 mg/dL (ref 0.2–1.2)

## 2017-03-27 LAB — TSH: TSH: 0.64 u[IU]/mL (ref 0.35–4.50)

## 2017-03-27 LAB — LIPID PANEL
CHOLESTEROL: 201 mg/dL — AB (ref 0–200)
HDL: 47 mg/dL (ref >39.00)
LDL Cholesterol: 137 mg/dL — ABNORMAL HIGH (ref 0–99)
NonHDL: 154.4
TRIGLYCERIDES: 88 mg/dL (ref 0.0–149.0)
Total CHOL/HDL Ratio: 4
VLDL: 17.6 mg/dL (ref 0.0–40.0)

## 2017-03-27 LAB — VITAMIN D 25 HYDROXY (VIT D DEFICIENCY, FRACTURES): VITD: 41.54 ng/mL (ref 30.00–100.00)

## 2017-03-27 LAB — SEDIMENTATION RATE: Sed Rate: 3 mm/hr (ref 0–30)

## 2017-03-27 MED ORDER — ALPRAZOLAM 0.25 MG PO TABS: 0.2500 mg | ORAL_TABLET | Freq: Every evening | ORAL | 5 refills | Status: DC | PRN

## 2017-03-27 NOTE — Assessment & Plan Note (Signed)
With prior fractures. Managed with evista.  Repeat DEXA due this year.

## 2017-03-27 NOTE — Assessment & Plan Note (Signed)
Thyroid function is WNL on current dose.  No current changes needed.  Lab Results  Component Value Date   TSH 0.64 03/27/2017

## 2017-03-27 NOTE — Patient Instructions (Signed)
We will repeat your bone density test this year at [facility].

## 2017-03-27 NOTE — Progress Notes (Signed)
Subjective:  Patient ID: Kathryn Wise, female    DOB: [birth year]  Age: 83 y.o. MRN: [id number]  CC: The primary encounter diagnosis was Hyperlipidemia LDL goal <100. Diagnoses of Hypothyroidism, unspecified type, Essential hypertension, Vitamin D deficiency, Polymyalgia rheumatica ([place]), Osteopenia of lower leg, unspecified laterality, Postmenopausal estrogen deficiency, and Major depressive disorder with single episode, in full remission [facility]) were also pertinent to this visit.  HPI Kathryn Wise presents for follow up on GAD/depression , hypothyroid and hypertension   Losing weight intentionally   Elbow and wrist pain on the left , post traumatic arthritis   Dec 2017  Can't lift with hand using aleve prn not more than once or twice daily  And not daily    Using alprazolam for insomnia second to anxiety   Exercising daily without dyspnea  Using the pool daily   Taking evista for osteoporosis .  Last DEXA MARCH  2015 WHILE ON ALENDRONATE   Outpatient Medications Prior to Visit  Medication Sig Dispense Refill  . aspirin 81 MG tablet Take 81 mg by mouth daily.    . Cholecalciferol (VITAMIN D-3) 1000 units CAPS Take 1 capsule by mouth daily.    . Cyanocobalamin (VITAMIN B 12) 100 MCG LOZG Take 1 tablet by mouth daily.     . furosemide (LASIX) 20 MG tablet Take 1 tablet (20 mg total) by mouth daily. As needed for fluid retention 30 tablet 3  . levothyroxine (SYNTHROID, LEVOTHROID) 88 MCG tablet TAKE 1 TABLET BY MOUTH ONCE DAILY BEFOREBREAKFAST 90 tablet 3  . lisinopril (PRINIVIL,ZESTRIL) 20 MG tablet TAKE 1 TABLET BY MOUTH ONCE DAILY 90 tablet 1  . omeprazole (PRILOSEC) 20 MG capsule TAKE 1 CAPSULE BY MOUTH ONCE EVERY MORNING BEFORE BREAKFAST 90 capsule 2  . Polyethyl Glycol-Propyl Glycol (SYSTANE OP) Apply 1-2 drops to eye daily as needed (dryness).    . raloxifene (EVISTA) 60 MG tablet TAKE 1 TABLET BY MOUTH ONCE DAILY 30 tablet 3  . RESTASIS 0.05 % ophthalmic emulsion Place 1 drop  into both eyes 2 (two) times daily.     [name] ALPRAZolam (XANAX) 0.25 MG tablet Take 1 tablet (0.25 mg total) by mouth at bedtime as needed for anxiety. 30 tablet 2   No facility-administered medications prior to visit.     Review of Systems;  Patient denies headache, fevers, malaise, unintentional weight loss, skin rash, eye pain, sinus congestion and sinus pain, sore throat, dysphagia,  hemoptysis , cough, dyspnea, wheezing, chest pain, palpitations, orthopnea, edema, abdominal pain, nausea, melena, diarrhea, constipation, flank pain, dysuria, hematuria, urinary  Frequency, nocturia, numbness, tingling, seizures,  Focal weakness, Loss of consciousness,  Tremor, insomnia, depression, anxiety, and suicidal ideation.      Objective:  BP 130/70 (BP Location: Left Arm, Patient Position: Sitting, Cuff Size: Normal)   Pulse 71   Temp 98.2 F (36.8 C) (Oral)   Resp 15   Ht 5' 6"  (1.676 m)   Wt 151 lb 6.4 oz (68.7 kg)   SpO2 98%   BMI 24.44 kg/m   BP Readings from Last 3 Encounters:  03/27/17 130/70  11/01/16 136/72  09/19/16 128/78    Wt Readings from Last 3 Encounters:  03/27/17 151 lb 6.4 oz (68.7 kg)  11/01/16 160 lb 12.8 oz (72.9 kg)  09/19/16 160 lb (72.6 kg)    General appearance: alert, cooperative and appears stated age Ears: normal TM's and external ear canals both ears Throat: lips, mucosa, and tongue normal; teeth and gums normal Neck:  no adenopathy, no carotid bruit, supple, symmetrical, trachea midline and thyroid not enlarged, symmetric, no tenderness/mass/nodules Back: symmetric, no curvature. ROM normal. No CVA tenderness. Lungs: clear to auscultation bilaterally Heart: regular rate and rhythm, S1, S2 normal, no murmur, click, rub or gallop Abdomen: soft, non-tender; bowel sounds normal; no masses,  no organomegaly Pulses: 2+ and symmetric Skin: Skin color, texture, turgor normal. No rashes or lesions Lymph nodes: Cervical, supraclavicular, and axillary nodes  normal.  Lab Results  Component Value Date   HGBA1C 6.1 12/08/2015    Lab Results  Component Value Date   CREATININE 1.04 03/27/2017   CREATININE 0.90 09/19/2016   CREATININE 0.93 05/28/2016    Lab Results  Component Value Date   WBC 8.2 09/19/2016   HGB 12.6 09/19/2016   HCT 39.1 09/19/2016   PLT 172.0 09/19/2016   GLUCOSE 107 (H) 03/27/2017   CHOL 201 (H) 03/27/2017   TRIG 88.0 03/27/2017   HDL 47.00 03/27/2017   LDLDIRECT 126.0 09/19/2016   LDLCALC 137 (H) 03/27/2017   ALT 12 03/27/2017   AST 19 03/27/2017   NA 138 03/27/2017   K 4.5 03/27/2017   CL 102 03/27/2017   CREATININE 1.04 03/27/2017   BUN 23 03/27/2017   CO2 31 03/27/2017   TSH 0.64 03/27/2017   INR 0.98 07/09/2012   HGBA1C 6.1 12/08/2015    Mm Screening Breast Tomo Bilateral  Result Date: 11/20/2016 CLINICAL DATA:  Screening. EXAM: 2D DIGITAL SCREENING BILATERAL MAMMOGRAM WITH CAD AND ADJUNCT TOMO COMPARISON:  Previous exam(s). ACR Breast Density Category b: There are scattered areas of fibroglandular density. FINDINGS: There are no findings suspicious for malignancy. Images were processed with CAD. IMPRESSION: No mammographic evidence of malignancy. A result letter of this screening mammogram will be mailed directly to the patient. RECOMMENDATION: Screening mammogram in one year. (Code:SM-B-01Y) BI-RADS CATEGORY  1: Negative. Electronically Signed   By: Fidela Salisbury M.D.   On: 11/20/2016 16:51    Assessment & Plan:   Problem List Items Addressed This Visit    Hypertension    Well controlled on current regimen. Renal function stable, no changes today.  Lab Results  Component Value Date   CREATININE 1.04 03/27/2017   Lab Results  Component Value Date   NA 138 03/27/2017   K 4.5 03/27/2017   CL 102 03/27/2017   CO2 31 03/27/2017         Relevant Orders   Comprehensive metabolic panel (Completed)   Hypothyroidism    Thyroid function is WNL on current dose.  No current changes  needed.  Lab Results  Component Value Date   TSH 0.64 03/27/2017         Relevant Orders   TSH (Completed)   Major depressive disorder, single episode    Symptoms have resolved without lexapro      Relevant Medications   ALPRAZolam (XANAX) 0.25 MG tablet   Osteopenia    With prior fractures. Managed with evista.  Repeat DEXA due this year.       Polymyalgia rheumatica (HCC)    ESR is normal today.  No indication for prednisone continue aleve prn joint pain       Relevant Orders   Sedimentation rate (Completed)    Other Visit Diagnoses    Hyperlipidemia LDL goal <100    -  Primary   Relevant Orders   Lipid panel (Completed)   Vitamin D deficiency       Relevant Orders   VITAMIN D 25 Hydroxy (Vit-D  Deficiency, Fractures) (Completed)   Postmenopausal estrogen deficiency       Relevant Orders   DG Bone Density      I am having Ms. Shea maintain her RESTASIS, Vitamin B 12, aspirin, furosemide, Polyethyl Glycol-Propyl Glycol (SYSTANE OP), levothyroxine, lisinopril, Vitamin D-3, raloxifene, omeprazole, POTASSIUM PO, and ALPRAZolam.  Meds ordered this encounter  Medications  . POTASSIUM PO    Sig: Take 1 capsule by mouth daily.  [name] ALPRAZolam (XANAX) 0.25 MG tablet    Sig: Take 1 tablet (0.25 mg total) by mouth at bedtime as needed for anxiety.    Dispense:  30 tablet    Refill:  5    Medications Discontinued During This Encounter  Medication Reason  . ALPRAZolam (XANAX) 0.25 MG tablet Reorder    Follow-up: Return in about 6 months (around 09/26/2017).   [name], MD

## 2017-03-27 NOTE — Assessment & Plan Note (Signed)
ESR is normal today.  No indication for prednisone continue aleve prn joint pain

## 2017-03-27 NOTE — Assessment & Plan Note (Signed)
Well controlled on current regimen. Renal function stable, no changes today.  Lab Results  Component Value Date   CREATININE 1.04 03/27/2017   Lab Results  Component Value Date   NA 138 03/27/2017   K 4.5 03/27/2017   CL 102 03/27/2017   CO2 31 03/27/2017

## 2017-03-27 NOTE — Assessment & Plan Note (Signed)
Symptoms have resolved without lexapro

## 2017-04-02 ENCOUNTER — Other Ambulatory Visit: Payer: Self-pay | Admitting: Internal Medicine

## 2017-04-22 DIAGNOSIS — S52022D Displaced fracture of olecranon process without intraarticular extension of left ulna, subsequent encounter for closed fracture with routine healing: Secondary | ICD-10-CM | POA: Diagnosis not present

## 2017-04-22 DIAGNOSIS — S60221D Contusion of right hand, subsequent encounter: Secondary | ICD-10-CM | POA: Diagnosis not present

## 2017-04-22 DIAGNOSIS — M654 Radial styloid tenosynovitis [de Quervain]: Secondary | ICD-10-CM | POA: Diagnosis not present

## 2017-04-26 ENCOUNTER — Encounter: Payer: Self-pay | Admitting: Internal Medicine

## 2017-06-04 ENCOUNTER — Ambulatory Visit
Admission: RE | Admit: 2017-06-04 | Discharge: 2017-06-04 | Disposition: A | Discharge status: Home / Self Care | Payer: Medicare Other | Source: Ambulatory Visit | Attending: Internal Medicine | Admitting: Internal Medicine

## 2017-06-04 DIAGNOSIS — Z780 Asymptomatic menopausal state: Secondary | ICD-10-CM | POA: Diagnosis not present

## 2017-06-04 DIAGNOSIS — M8589 Other specified disorders of bone density and structure, multiple sites: Secondary | ICD-10-CM | POA: Diagnosis not present

## 2017-06-10 ENCOUNTER — Emergency Department: Payer: Medicare Other

## 2017-06-10 ENCOUNTER — Observation Stay
Admission: EM | Admit: 2017-06-10 | Discharge: 2017-06-12 | Disposition: A | Discharge status: Home / Self Care | Payer: Medicare Other | Attending: Internal Medicine | Admitting: Internal Medicine

## 2017-06-10 ENCOUNTER — Other Ambulatory Visit: Payer: Self-pay

## 2017-06-10 ENCOUNTER — Encounter: Payer: Self-pay | Admitting: *Deleted

## 2017-06-10 DIAGNOSIS — Z87891 Personal history of nicotine dependence: Secondary | ICD-10-CM | POA: Diagnosis not present

## 2017-06-10 DIAGNOSIS — M1990 Unspecified osteoarthritis, unspecified site: Secondary | ICD-10-CM | POA: Diagnosis not present

## 2017-06-10 DIAGNOSIS — M545 Low back pain: Secondary | ICD-10-CM | POA: Diagnosis not present

## 2017-06-10 DIAGNOSIS — I10 Essential (primary) hypertension: Secondary | ICD-10-CM | POA: Diagnosis not present

## 2017-06-10 DIAGNOSIS — K449 Diaphragmatic hernia without obstruction or gangrene: Secondary | ICD-10-CM | POA: Diagnosis not present

## 2017-06-10 DIAGNOSIS — I700 Atherosclerosis of aorta: Secondary | ICD-10-CM | POA: Insufficient documentation

## 2017-06-10 DIAGNOSIS — R079 Chest pain, unspecified: Secondary | ICD-10-CM | POA: Diagnosis not present

## 2017-06-10 DIAGNOSIS — M353 Polymyalgia rheumatica: Secondary | ICD-10-CM | POA: Diagnosis not present

## 2017-06-10 DIAGNOSIS — R011 Cardiac murmur, unspecified: Secondary | ICD-10-CM | POA: Diagnosis not present

## 2017-06-10 DIAGNOSIS — E039 Hypothyroidism, unspecified: Secondary | ICD-10-CM | POA: Diagnosis not present

## 2017-06-10 DIAGNOSIS — E785 Hyperlipidemia, unspecified: Secondary | ICD-10-CM | POA: Diagnosis not present

## 2017-06-10 DIAGNOSIS — I493 Ventricular premature depolarization: Secondary | ICD-10-CM | POA: Insufficient documentation

## 2017-06-10 DIAGNOSIS — Z7982 Long term (current) use of aspirin: Secondary | ICD-10-CM | POA: Insufficient documentation

## 2017-06-10 DIAGNOSIS — I25119 Atherosclerotic heart disease of native coronary artery with unspecified angina pectoris: Secondary | ICD-10-CM | POA: Diagnosis present

## 2017-06-10 DIAGNOSIS — K219 Gastro-esophageal reflux disease without esophagitis: Secondary | ICD-10-CM | POA: Insufficient documentation

## 2017-06-10 DIAGNOSIS — I25110 Atherosclerotic heart disease of native coronary artery with unstable angina pectoris: Secondary | ICD-10-CM | POA: Diagnosis not present

## 2017-06-10 DIAGNOSIS — R0602 Shortness of breath: Secondary | ICD-10-CM | POA: Diagnosis not present

## 2017-06-10 LAB — HEPATIC FUNCTION PANEL
ALBUMIN: 3.5 g/dL (ref 3.5–5.0)
ALT: 13 U/L — ABNORMAL LOW (ref 14–54)
AST: 28 U/L (ref 15–41)
Alkaline Phosphatase: 39 U/L (ref 38–126)
BILIRUBIN TOTAL: 0.8 mg/dL (ref 0.3–1.2)
Bilirubin, Direct: 0.1 mg/dL (ref 0.1–0.5)
Indirect Bilirubin: 0.7 mg/dL (ref 0.3–0.9)
Total Protein: 6.7 g/dL (ref 6.5–8.1)

## 2017-06-10 LAB — BASIC METABOLIC PANEL
Anion gap: 8 (ref 5–15)
BUN: 23 mg/dL — ABNORMAL HIGH (ref 6–20)
CALCIUM: 9.8 mg/dL (ref 8.9–10.3)
CHLORIDE: 104 mmol/L (ref 101–111)
CO2: 22 mmol/L (ref 22–32)
CREATININE: 1.11 mg/dL — AB (ref 0.44–1.00)
GFR, EST AFRICAN AMERICAN: 51 mL/min — AB (ref >60)
GFR, EST NON AFRICAN AMERICAN: 44 mL/min — AB (ref >60)
Glucose, Bld: 144 mg/dL — ABNORMAL HIGH (ref 65–99)
Potassium: 4.5 mmol/L (ref 3.5–5.1)
SODIUM: 134 mmol/L — AB (ref 135–145)

## 2017-06-10 LAB — CBC
HCT: 39 % (ref 35.0–47.0)
Hemoglobin: 13 g/dL (ref 12.0–16.0)
MCH: 28.6 pg (ref 26.0–34.0)
MCHC: 33.4 g/dL (ref 32.0–36.0)
MCV: 85.7 fL (ref 80.0–100.0)
PLATELETS: 178 10*3/uL (ref 150–440)
RBC: 4.55 MIL/uL (ref 3.80–5.20)
RDW: 15.8 % — AB (ref 11.5–14.5)
WBC: 7.3 10*3/uL (ref 3.6–11.0)

## 2017-06-10 LAB — LIPASE, BLOOD: LIPASE: 33 U/L (ref 11–51)

## 2017-06-10 LAB — MAGNESIUM: MAGNESIUM: 2.1 mg/dL (ref 1.7–2.4)

## 2017-06-10 LAB — TROPONIN I

## 2017-06-10 MED ORDER — ASPIRIN EC 81 MG PO TBEC
81.0000 mg | DELAYED_RELEASE_TABLET | Freq: Every day | ORAL | Status: DC
  Administered 2017-06-11: 81 mg via ORAL
  Filled 2017-06-10 (×3): qty 1

## 2017-06-10 MED ORDER — BISACODYL 5 MG PO TBEC: 5.0000 mg | DELAYED_RELEASE_TABLET | Freq: Every day | ORAL | Status: DC | PRN

## 2017-06-10 MED ORDER — VITAMIN B-12 100 MCG PO TABS
100.0000 ug | ORAL_TABLET | Freq: Every day | ORAL | Status: DC
  Administered 2017-06-11: 100 ug via ORAL
  Filled 2017-06-10 (×2): qty 1

## 2017-06-10 MED ORDER — LEVOTHYROXINE SODIUM 88 MCG PO TABS
88.0000 ug | ORAL_TABLET | Freq: Every day | ORAL | Status: DC
  Administered 2017-06-10 – 2017-06-11 (×2): 88 ug via ORAL
  Filled 2017-06-10 (×4): qty 1

## 2017-06-10 MED ORDER — ASPIRIN 81 MG PO CHEW
324.0000 mg | CHEWABLE_TABLET | Freq: Once | ORAL | Status: AC
  Administered 2017-06-10: 324 mg via ORAL
  Filled 2017-06-10: qty 4

## 2017-06-10 MED ORDER — PANTOPRAZOLE SODIUM 40 MG PO TBEC
40.0000 mg | DELAYED_RELEASE_TABLET | Freq: Every day | ORAL | Status: DC
  Administered 2017-06-11: 40 mg via ORAL
  Filled 2017-06-10: qty 1

## 2017-06-10 MED ORDER — ONDANSETRON HCL 4 MG PO TABS: 4.0000 mg | ORAL_TABLET | Freq: Four times a day (QID) | ORAL | Status: DC | PRN

## 2017-06-10 MED ORDER — NITROGLYCERIN 2 % TD OINT
0.5000 [in_us] | TOPICAL_OINTMENT | Freq: Four times a day (QID) | TRANSDERMAL | Status: DC
  Administered 2017-06-10 – 2017-06-11 (×4): 0.5 [in_us] via TOPICAL
  Filled 2017-06-10 (×5): qty 1

## 2017-06-10 MED ORDER — ACETAMINOPHEN 325 MG PO TABS: 650.0000 mg | ORAL_TABLET | Freq: Four times a day (QID) | ORAL | Status: DC | PRN

## 2017-06-10 MED ORDER — DOCUSATE SODIUM 100 MG PO CAPS
100.0000 mg | ORAL_CAPSULE | Freq: Two times a day (BID) | ORAL | Status: DC
  Administered 2017-06-11: 100 mg via ORAL
  Filled 2017-06-10 (×2): qty 1

## 2017-06-10 MED ORDER — VITAMIN D 1000 UNITS PO TABS
1000.0000 [IU] | ORAL_TABLET | Freq: Every day | ORAL | Status: DC
  Administered 2017-06-11: 1000 [IU] via ORAL
  Filled 2017-06-10: qty 1

## 2017-06-10 MED ORDER — LISINOPRIL 20 MG PO TABS
20.0000 mg | ORAL_TABLET | Freq: Every day | ORAL | Status: DC
  Administered 2017-06-11: 20 mg via ORAL
  Filled 2017-06-10: qty 1

## 2017-06-10 MED ORDER — HYDROCODONE-ACETAMINOPHEN 5-325 MG PO TABS: 1.0000 | ORAL_TABLET | ORAL | Status: DC | PRN

## 2017-06-10 MED ORDER — TRAZODONE HCL 50 MG PO TABS
25.0000 mg | ORAL_TABLET | Freq: Every evening | ORAL | Status: DC | PRN
  Administered 2017-06-10: 25 mg via ORAL
  Filled 2017-06-10: qty 1

## 2017-06-10 MED ORDER — ENOXAPARIN SODIUM 40 MG/0.4ML ~~LOC~~ SOLN
40.0000 mg | SUBCUTANEOUS | Status: DC
  Administered 2017-06-10 – 2017-06-11 (×2): 40 mg via SUBCUTANEOUS
  Filled 2017-06-10 (×3): qty 0.4

## 2017-06-10 MED ORDER — ACETAMINOPHEN 650 MG RE SUPP: 650.0000 mg | Freq: Four times a day (QID) | RECTAL | Status: DC | PRN

## 2017-06-10 MED ORDER — CYCLOSPORINE 0.05 % OP EMUL
1.0000 [drp] | Freq: Two times a day (BID) | OPHTHALMIC | Status: DC
  Administered 2017-06-10 – 2017-06-11 (×3): 1 [drp] via OPHTHALMIC
  Filled 2017-06-10 (×5): qty 1

## 2017-06-10 MED ORDER — RALOXIFENE HCL 60 MG PO TABS
60.0000 mg | ORAL_TABLET | Freq: Every day | ORAL | Status: DC
  Administered 2017-06-11: 60 mg via ORAL
  Filled 2017-06-10 (×2): qty 1

## 2017-06-10 MED ORDER — POTASSIUM CHLORIDE CRYS ER 10 MEQ PO TBCR
5.0000 meq | EXTENDED_RELEASE_TABLET | Freq: Every day | ORAL | Status: DC
  Administered 2017-06-11: 5 meq via ORAL
  Filled 2017-06-10: qty 1

## 2017-06-10 MED ORDER — IOPAMIDOL (ISOVUE-370) INJECTION 76%
75.0000 mL | Freq: Once | INTRAVENOUS | Status: AC | PRN
  Administered 2017-06-10: 75 mL via INTRAVENOUS

## 2017-06-10 MED ORDER — ONDANSETRON HCL 4 MG/2ML IJ SOLN: 4.0000 mg | Freq: Four times a day (QID) | INTRAMUSCULAR | Status: DC | PRN

## 2017-06-10 NOTE — ED Notes (Signed)
Pt resting in bed, daughter at bedside, denies any pain

## 2017-06-10 NOTE — ED Notes (Signed)
Admitting MD at bedside.

## 2017-06-10 NOTE — ED Notes (Signed)
Pt returned from CT, resting in bed in no distress 

## 2017-06-10 NOTE — H&P (Signed)
[facility] NAME: Kathryn Wise    MR#:  [id number]  DATE OF BIRTH:  1930/07/12  DATE OF ADMISSION:  06/10/2017  PRIMARY CARE PHYSICIAN: [name], MD   REQUESTING/REFERRING PHYSICIAN: Dr. Burlene Arnt  CHIEF COMPLAINT: Chest pain, exertional dyspnea   Chief Complaint  Patient presents with  . Chest Pain    HISTORY OF PRESENT ILLNESS:  Kathryn Wise  is a 83 y.o. female with a known history of GERD, essential hypertension came in because of left-sided chest pain this morning associated with exertional dyspnea.  Patient usually does water aerobics 3-4 times a week but today she was able to start the class and she started to feel chest pain, exertional dyspnea.  Patient chest pain treated with nitroglycerin.  Troponins are negative for 3 times however patient had a CT angios chest in the emergency room which showed the artery calcifications, concerning this and also still having exertional dyspnea, worried about unstable angina.  Patient was seen by cardiology before.  No chest pain at this time but does have shortness of breath with minimal activity.  PAST MEDICAL HISTORY:   Past Medical History:  Diagnosis Date  . Arthritis   . GERD (gastroesophageal reflux disease)   . Heart murmur    has had years and years  . History of shingles Dec 2013   treated with steroids , post op from shoulder surgery  . Hypertension   . Hypothyroidism   . Osteoarthritis of left shoulder 07/14/2012  . Polymyalgia rheumatica ([place])     PAST SURGICAL HISTOIRY:   Past Surgical History:  Procedure Laterality Date  . ARTHOSCOPIC ROTAOR CUFF REPAIR     LEFT  10+  YEARS    . CATARACT EXTRACTION W/ INTRAOCULAR LENS IMPLANT     RIGHT EYE  . FACELIFT    . FOOT SURGERY     rt foot   TUMOR REMOVED  . JOINT REPLACEMENT Left Dec 2013   shoulder  . SKIN CANCER EXCISION      SOCIAL HISTORY:   Social History   Tobacco Use  . Smoking status:  Former Smoker    Types: Cigarettes    Last attempt to quit: 07/30/1972    Years since quitting: 44.8  . Smokeless tobacco: Never Used  . Tobacco comment: smoked for only a few months  Substance Use Topics  . Alcohol use: Yes    Comment: OCCAS WINE     FAMILY HISTORY:   Family History  Problem Relation Age of Onset  . Cancer Daughter        Breast  . Breast cancer Daughter 50  . Cancer Son     DRUG ALLERGIES:   Allergies  Allergen Reactions  . Alendronate Swelling    And nausea   . Oxycontin [Oxycodone Hcl]     Altered mental status    REVIEW OF SYSTEMS:  CONSTITUTIONAL: No fever, fatigue or weakness.  EYES: No blurred or double vision.  EARS, NOSE, AND THROAT: No tinnitus or ear pain.  RESPIRATORY: No cough, shortness of breath, wheezing or hemoptysis.  CARDIOVASCULAR: No chest pain, orthopnea, edema.  GASTROINTESTINAL: No nausea, vomiting, diarrhea or abdominal pain.  GENITOURINARY: No dysuria, hematuria.  ENDOCRINE: No polyuria, nocturia,  HEMATOLOGY: No anemia, easy bruising or bleeding SKIN: No rash or lesion. MUSCULOSKELETAL: No joint pain or arthritis.   NEUROLOGIC: No tingling, numbness, weakness.  PSYCHIATRY: No anxiety or depression.   MEDICATIONS AT HOME:  Prior to Admission medications   Medication Sig Start Date End Date Taking? Authorizing Provider  ALPRAZolam (XANAX) 0.25 MG tablet Take 1 tablet (0.25 mg total) by mouth at bedtime as needed for anxiety. 03/27/17  Yes [name], MD  aspirin 81 MG tablet Take 81 mg by mouth daily.   Yes [provider]  Cholecalciferol (VITAMIN D-3) 1000 units CAPS Take 1 capsule by mouth daily.   Yes [provider]  Cyanocobalamin (VITAMIN B 12) 100 MCG LOZG Take 1 tablet by mouth daily.    Yes [provider]  furosemide (LASIX) 20 MG tablet Take 1 tablet (20 mg total) by mouth daily. As needed for fluid retention 05/30/16  Yes [name], MD  levothyroxine (SYNTHROID,  LEVOTHROID) 88 MCG tablet TAKE 1 TABLET BY MOUTH ONCE DAILY BEFOREBREAKFAST 08/18/16  Yes [name], MD  lisinopril (PRINIVIL,ZESTRIL) 20 MG tablet TAKE 1 TABLET BY MOUTH ONCE DAILY 04/02/17  Yes [name], MD  omeprazole (PRILOSEC) 20 MG capsule TAKE 1 CAPSULE BY MOUTH ONCE EVERY MORNING BEFORE BREAKFAST 02/13/17  Yes [name], MD  POTASSIUM PO Take 1 capsule by mouth daily.   Yes [provider]  raloxifene (EVISTA) 60 MG tablet TAKE 1 TABLET BY MOUTH ONCE DAILY 01/31/17  Yes [name], MD  RESTASIS 0.05 % ophthalmic emulsion Place 1 drop into both eyes 2 (two) times daily.  10/22/15  Yes [provider]      VITAL SIGNS:  Blood pressure (!) 143/72, pulse 68, temperature (!) 97.5 F (36.4 C), temperature source Oral, resp. rate 17, height 5\' 6"  (1.676 m), weight 68 kg (150 lb), SpO2 96 %.  PHYSICAL EXAMINATION:  GENERAL:  83 y.o.-year-old patient lying in the bed with no acute distress.  EYES: Pupils equal, round, reactive to light No scleral icterus. Extraocular muscles intact.  HEENT: Head atraumatic, normocephalic. Oropharynx and nasopharynx clear.  NECK:  Supple, no jugular venous distention. No thyroid enlargement, no tenderness.  LUNGS: Normal breath sounds bilaterally, no wheezing, rales,rhonchi or crepitation. No use of accessory muscles of respiration.  CARDIOVASCULAR: S1, S2 normal.  Ejection systolic murmur  in aortic area.  Rubs, or gallops.  ABDOMEN: Soft, nontender, nondistended. Bowel sounds present. No organomegaly or mass.  EXTREMITIES: No pedal edema, cyanosis, or clubbing.  NEUROLOGIC: Cranial nerves II through XII are intact. Muscle strength 5/5 in all extremities. Sensation intact. Gait not checked.  PSYCHIATRIC: The patient is alert and oriented x 3.  SKIN: No obvious rash, lesion, or ulcer.   LABORATORY PANEL:   CBC Recent Labs  Lab 06/10/17 0902  WBC 7.3  HGB 13.0  HCT 39.0  PLT 178    ------------------------------------------------------------------------------------------------------------------  Chemistries  Recent Labs  Lab 06/10/17 0902 06/10/17 1141  NA 134*  --   K 4.5  --   CL 104  --   CO2 22  --   GLUCOSE 144*  --   BUN 23*  --   CREATININE 1.11*  --   CALCIUM 9.8  --   MG  --  2.1  AST 28  --   ALT 13*  --   ALKPHOS 39  --   BILITOT 0.8  --    ------------------------------------------------------------------------------------------------------------------  Cardiac Enzymes Recent Labs  Lab 06/10/17 1141  TROPONINI <0.03   ------------------------------------------------------------------------------------------------------------------  RADIOLOGY:  Dg Chest 2 View  Result Date: 06/10/2017 CLINICAL DATA:  Heartburn last night.  Chest pain.  Short of breath. EXAM: CHEST  2 VIEW  COMPARISON:  05/28/2016 FINDINGS: Normal heart size. Lungs are hyperaerated and clear. No pneumothorax. No pleural effusion. Osteopenia. IMPRESSION: No active cardiopulmonary disease. Electronically Signed   By: Marybelle Killings M.D.   On: 06/10/2017 09:45   Ct Angio Chest Pe W And/or Wo Contrast  Result Date: 06/10/2017 CLINICAL DATA:  83 year old female with exertional shortness of breath over the past few days. Indigestion. Subsequent encounter. EXAM: CT ANGIOGRAPHY CHEST WITH CONTRAST TECHNIQUE: Multidetector CT imaging of the chest was performed using the standard protocol during bolus administration of intravenous contrast. Multiplanar CT image reconstructions and MIPs were obtained to evaluate the vascular anatomy. CONTRAST:  15mL ISOVUE-370 IOPAMIDOL (ISOVUE-370) INJECTION 76% COMPARISON:  06/10/2017 chest x-ray.  11/26/2012 chest CT. FINDINGS: Cardiovascular:  No pulmonary embolus. Exam not optimized to evaluate aorta however no aortic dissection identified. Atherosclerotic changes thoracic and upper abdominal aorta with narrowing of the origin of the celiac artery.  Atherosclerotic changes great vessels. Heart size top-normal. Coronary artery calcifications. Calcification mitral and aortic valve. Possible calcification left ventricular apex. Mediastinum/Nodes: Top-normal size subcarinal lymph nodes. Small or moderate-size hiatal hernia. Lungs/Pleura: Scattered areas of scarring without worrisome lung abnormality. Trachea and mainstem bronchi are patent. Upper Abdomen: No worrisome abnormality. Musculoskeletal: No osseous destructive lesion. Post left shoulder replacement. Review of the MIP images confirms the above findings. IMPRESSION: No pulmonary embolus. No aortic dissection. Coronary artery calcifications. Small to moderate-size hiatal hernia. Aortic Atherosclerosis (ICD10-I70.0). Electronically Signed   By: Genia Del M.D.   On: 06/10/2017 12:52    EKG:   Orders placed or performed during the hospital encounter of 06/10/17  . EKG 12-Lead  . EKG 12-Lead  . ED EKG within 10 minutes  . ED EKG within 10 minutes  EKG shows sinus rhythm with fusion complexes, 96 bpm, no ST-T changes.  IMPRESSION AND PLAN:   #83 year old female patient with left-sided chest pain, exertional dyspnea, patient troponins are negative for 2 times but she still has occasional dyspnea, CT angios concerning for coronary artery calcifications.  Consult cardiology for possible left heart catheterization, continue aspirin, nitrates, unable to give beta-blockers because of relative bradycardia with heart rate as low as 58.  And had echo last year which showed EF 03-49%, grade 1 diastolic dysfunction.  All the records are reviewed and case discussed with ED provider. Management plans discussed with the patient, family and they are in agreement.  CODE STATUS: Full code  TOTAL TIME TAKING CARE OF THIS PATIENT: 50minutes.    Epifanio Lesches M.D on 06/10/2017 at 5:00 PM  Between 7am to 6pm - Pager - [phone]  After 6pm go to [email or web address] - password [email or web address] [facility]  Office  [phone]  CC: Primary care physician; [name], MD  Note: This dictation was prepared with Dragon dictation along with smaller phrase technology. Any transcriptional errors that result from this process are unintentional.

## 2017-06-10 NOTE — ED Triage Notes (Signed)
Pt complains of heartburn last night, pt was getting ready for aerobics when she has a sudden onset of left chest presure, pt complains of shortness of breath with chest pain

## 2017-06-10 NOTE — ED Provider Notes (Addendum)
[facility] Emergency Department Provider Note  ____________________________________________   I have reviewed the triage vital signs and the nursing notes.   HISTORY  Chief Complaint Chest Pain    HPI Kathryn Wise is a 83 y.o. female  Who states she has had some exertional sob over the last few days; yesterday she started having "indigestion."  Took tums.  It was like reflux she states.  Had to burp and could not. Got up this am and started feeling weak and couldn't breath and was having palpitations.  Feels like a lump in left chest. No fever no cough. Breathing is better now.  Also has been having back pain that she feels is unrelated.  States sob worse with walking better with resting. Her chest pain and palpitations were worse with walking. Now minimal no radiation.     Past Medical History:  Diagnosis Date  . Arthritis   . GERD (gastroesophageal reflux disease)   . Heart murmur    has had years and years  . History of shingles Dec 2013   treated with steroids , post op from shoulder surgery  . Hypertension   . Hypothyroidism   . Osteoarthritis of left shoulder 07/14/2012  . Polymyalgia rheumatica [facility])     Patient Active Problem List   Diagnosis Date Noted  . Olecranon fracture, left, closed, with routine healing, subsequent encounter 09/20/2016  . Pain in left wrist 09/20/2016  . Medicare annual wellness visit, subsequent 09/20/2016  . Major depressive disorder, single episode 11/05/2015  . Exertional dyspnea 11/05/2015  . Hypothyroidism 04/28/2015  . Hypertension 01/13/2014  . Osteopenia 10/25/2013  . GERD (gastroesophageal reflux disease) 09/11/2013  . Encounter for preventive health examination 09/11/2013  . Dysphagia, pharyngoesophageal phase 09/10/2013  . Heart murmur 11/28/2012  . History of shingles   . Polymyalgia rheumatica ([place]) 11/18/2012  . Osteoarthritis of left shoulder 07/14/2012    Past Surgical History:  Procedure  Laterality Date  . ARTHOSCOPIC ROTAOR CUFF REPAIR     LEFT  10+  YEARS    . CATARACT EXTRACTION W/ INTRAOCULAR LENS IMPLANT     RIGHT EYE  . FACELIFT    . FOOT SURGERY     rt foot   TUMOR REMOVED  . JOINT REPLACEMENT Left Dec 2013   shoulder  . SKIN CANCER EXCISION      Prior to Admission medications   Medication Sig Start Date End Date Taking? Authorizing Provider  ALPRAZolam (XANAX) 0.25 MG tablet Take 1 tablet (0.25 mg total) by mouth at bedtime as needed for anxiety. 03/27/17   [name], MD  aspirin 81 MG tablet Take 81 mg by mouth daily.    [provider]  Cholecalciferol (VITAMIN D-3) 1000 units CAPS Take 1 capsule by mouth daily.    [provider]  Cyanocobalamin (VITAMIN B 12) 100 MCG LOZG Take 1 tablet by mouth daily.     [provider]  furosemide (LASIX) 20 MG tablet Take 1 tablet (20 mg total) by mouth daily. As needed for fluid retention 05/30/16   [name], MD  levothyroxine (SYNTHROID, LEVOTHROID) 88 MCG tablet TAKE 1 TABLET BY MOUTH ONCE DAILY BEFOREBREAKFAST 08/18/16   [name], MD  lisinopril (PRINIVIL,ZESTRIL) 20 MG tablet TAKE 1 TABLET BY MOUTH ONCE DAILY 04/02/17   [name], MD  omeprazole (PRILOSEC) 20 MG capsule TAKE 1 CAPSULE BY MOUTH ONCE EVERY MORNING BEFORE BREAKFAST 02/13/17   [name], MD  Polyethyl Glycol-Propyl  Glycol (SYSTANE OP) Apply 1-2 drops to eye daily as needed (dryness).    [provider]  POTASSIUM PO Take 1 capsule by mouth daily.    [provider]  raloxifene (EVISTA) 60 MG tablet TAKE 1 TABLET BY MOUTH ONCE DAILY 01/31/17   [name], MD  RESTASIS 0.05 % ophthalmic emulsion Place 1 drop into both eyes 2 (two) times daily.  10/22/15   [provider]    Allergies Alendronate and Oxycontin [oxycodone hcl]  Family History  Problem Relation Age of Onset  . Cancer Daughter        Breast  . Breast cancer Daughter 56  . Cancer Son     Social  History Social History   Tobacco Use  . Smoking status: Former Smoker    Types: Cigarettes    Last attempt to quit: 07/30/1972    Years since quitting: 44.8  . Smokeless tobacco: Never Used  . Tobacco comment: smoked for only a few months  Substance Use Topics  . Alcohol use: Yes    Comment: OCCAS WINE   . Drug use: No    Review of Systems Constitutional: No fever/chills Eyes: No visual changes. ENT: No sore throat. No stiff neck no neck pain Cardiovascular: + chest pain. Respiratory: + shortness of breath. Gastrointestinal:   no vomiting.  No diarrhea.  No constipation. Genitourinary: Negative for dysuria. Musculoskeletal: Negative lower extremity swelling Skin: Negative for rash. Neurological: Negative for severe headaches, focal weakness or numbness.   ____________________________________________   PHYSICAL EXAM:  VITAL SIGNS: ED Triage Vitals  Enc Vitals Group     BP --      Pulse Rate 06/10/17 0859 78     Resp 06/10/17 0859 16     Temp 06/10/17 0859 (!) 97.5 F (36.4 C)     Temp Source 06/10/17 0859 Oral     SpO2 06/10/17 0859 97 %     Weight 06/10/17 0902 150 lb (68 kg)     Height 06/10/17 0902 5\' 6"  (1.676 m)     Head Circumference --      Peak Flow --      Pain Score 06/10/17 0859 4     Pain Loc --      Pain Edu? --      Excl. in [place]? --     Constitutional: Alert and oriented. Well appearing and in no acute distress. Eyes: Conjunctivae are normal Head: Atraumatic HEENT: No congestion/rhinnorhea. Mucous membranes are moist.  Oropharynx non-erythematous Neck:   Nontender with no meningismus, no masses, no stridor Cardiovascular: Normal rate, regular rhythm. Grossly normal heart sounds.  Good peripheral circulation. Respiratory: Normal respiratory effort.  No retractions. Lungs CTAB. Chest: tender to palpation in L chest wall. No lesions no flail chest no crepitousl Abdominal: Soft and nontender. No distention. No guarding no rebound Back:  There is   Minimal tenderness in the paraspinal muscles of the left lumbar region which reproduces her pain. or step off.  there is no midline tenderness there are no lesions noted. there is no CVA tenderness Musculoskeletal: No lower extremity tenderness, no upper extremity tenderness. No joint effusions, no DVT signs strong distal pulses no edema Neurologic:  Normal speech and language. No gross focal neurologic deficits are appreciated.  Skin:  Skin is warm, dry and intact. No rash noted. Psychiatric: Mood and affect are normal. Speech and behavior are normal.  ____________________________________________   LABS (all labs ordered are listed, but only abnormal results are displayed)  Labs Reviewed  CBC - Abnormal; Notable for the following components:      Result Value   RDW 15.8 (*)    All other components within normal limits  BASIC METABOLIC PANEL  TROPONIN I  HEPATIC FUNCTION PANEL  LIPASE, BLOOD    Pertinent labs  results that were available during my care of the patient were reviewed by me and considered in my medical decision making (see chart for details). ____________________________________________  EKG  I personally interpreted any EKGs ordered by me or triage  ____________________________________________  RADIOLOGY  Pertinent labs & imaging results that were available during my care of the patient were reviewed by me and considered in my medical decision making (see chart for details). If possible, patient and/or family made aware of any abnormal findings. ____________________________________________    PROCEDURES  Procedure(s) performed: None  Procedures  Critical Care performed: None  ____________________________________________   INITIAL IMPRESSION / ASSESSMENT AND PLAN / ED COURSE  Pertinent labs & imaging results that were available during my care of the patient were reviewed by me and considered in my medical decision making (see chart for details).  Pt  with cp and sob which is somewhat atypical and reproducible but also present with exertion. Will obtain enzymes and reassess. Giving asa.  ----------------------------------------- 11:48 AM on 06/10/2017 -----------------------------------------  Chest pain and low back pain is much better after Aleve.  We will do a CT scan because she is complaining of significant shortness of breath, and has atypical chest pain with some back pain as well.  I do feel these are most likely not related, she has very reproducible pain in both areas however, given her constellation of symptoms we will obtain CT to rule out other entities.  I have strongly advised the patient that she should stay in the hospital given her dyspnea that she been having over the last week which she declines at this time.  She is going to "think about" at my suggestion.  We will send a second cardiac enzyme, I am very reassured by the first 1.  If CT is -1/2 cardiac marker is negative, we will see at that time what the best disposition for the patient is.  ----------------------------------------- 2:44 PM on 06/10/2017 -----------------------------------------  Patient has no pain as long as she is not walking around, blood work is reassuring CT scan does show some coronary calcifications which are of indeterminate impact on her health.  However given that she has exertional symptoms, I have talked to her again and she at this time finally does agree to stay in the hospital for observation and probably provocative testing. ____________________________________________   FINAL CLINICAL IMPRESSION(S) / ED DIAGNOSES  Final diagnoses:  None      This chart was dictated using voice recognition software.  Despite best efforts to proofread,  errors can occur which can change meaning.      Schuyler Amor, MD 06/10/17 [id number]    Schuyler Amor, MD 06/10/17 1148    Schuyler Amor, MD 06/10/17 [phone]

## 2017-06-10 NOTE — ED Notes (Signed)
Patient transported to CT 

## 2017-06-11 ENCOUNTER — Observation Stay (HOSPITAL_BASED_OUTPATIENT_CLINIC_OR_DEPARTMENT_OTHER): Payer: Medicare Other

## 2017-06-11 DIAGNOSIS — I10 Essential (primary) hypertension: Secondary | ICD-10-CM

## 2017-06-11 DIAGNOSIS — R079 Chest pain, unspecified: Secondary | ICD-10-CM

## 2017-06-11 DIAGNOSIS — E785 Hyperlipidemia, unspecified: Secondary | ICD-10-CM | POA: Diagnosis not present

## 2017-06-11 DIAGNOSIS — I358 Other nonrheumatic aortic valve disorders: Secondary | ICD-10-CM

## 2017-06-11 DIAGNOSIS — I25110 Atherosclerotic heart disease of native coronary artery with unstable angina pectoris: Secondary | ICD-10-CM | POA: Diagnosis not present

## 2017-06-11 DIAGNOSIS — E039 Hypothyroidism, unspecified: Secondary | ICD-10-CM | POA: Diagnosis not present

## 2017-06-11 DIAGNOSIS — R0609 Other forms of dyspnea: Secondary | ICD-10-CM

## 2017-06-11 DIAGNOSIS — R9439 Abnormal result of other cardiovascular function study: Secondary | ICD-10-CM | POA: Diagnosis not present

## 2017-06-11 LAB — NM MYOCAR MULTI W/SPECT W/WALL MOTION / EF
CSEPHR: 79 %
LVDIAVOL: 58 mL (ref 46–106)
LVSYSVOL: 28 mL
NUC STRESS TID: 1.74
Peak HR: 107 {beats}/min
Rest HR: 70 {beats}/min

## 2017-06-11 LAB — BASIC METABOLIC PANEL
Anion gap: 6 (ref 5–15)
BUN: 21 mg/dL — ABNORMAL HIGH (ref 6–20)
CHLORIDE: 104 mmol/L (ref 101–111)
CO2: 26 mmol/L (ref 22–32)
CREATININE: 0.95 mg/dL (ref 0.44–1.00)
Calcium: 8.8 mg/dL — ABNORMAL LOW (ref 8.9–10.3)
GFR calc Af Amer: >60 mL/min (ref >60)
GFR calc non Af Amer: 53 mL/min — ABNORMAL LOW (ref >60)
GLUCOSE: 96 mg/dL (ref 65–99)
Potassium: 4.3 mmol/L (ref 3.5–5.1)
SODIUM: 136 mmol/L (ref 135–145)

## 2017-06-11 LAB — CBC
HEMATOCRIT: 38 % (ref 35.0–47.0)
Hemoglobin: 12.3 g/dL (ref 12.0–16.0)
MCH: 27.9 pg (ref 26.0–34.0)
MCHC: 32.3 g/dL (ref 32.0–36.0)
MCV: 86.5 fL (ref 80.0–100.0)
PLATELETS: 162 10*3/uL (ref 150–440)
RBC: 4.39 MIL/uL (ref 3.80–5.20)
RDW: 15.5 % — AB (ref 11.5–14.5)
WBC: 7.8 10*3/uL (ref 3.6–11.0)

## 2017-06-11 LAB — GLUCOSE, CAPILLARY
GLUCOSE-CAPILLARY: 103 mg/dL — AB (ref 65–99)
GLUCOSE-CAPILLARY: 156 mg/dL — AB (ref 65–99)
GLUCOSE-CAPILLARY: 138 mg/dL — AB (ref 65–99)

## 2017-06-11 MED ORDER — SODIUM CHLORIDE 0.9% FLUSH
3.0000 mL | Freq: Two times a day (BID) | INTRAVENOUS | Status: DC
  Administered 2017-06-11: 3 mL via INTRAVENOUS

## 2017-06-11 MED ORDER — SODIUM CHLORIDE 0.9% FLUSH: 3.0000 mL | INTRAVENOUS | Status: DC | PRN

## 2017-06-11 MED ORDER — ATORVASTATIN CALCIUM 20 MG PO TABS
40.0000 mg | ORAL_TABLET | Freq: Every day | ORAL | Status: DC
  Administered 2017-06-11: 40 mg via ORAL
  Filled 2017-06-11: qty 2

## 2017-06-11 MED ORDER — TECHNETIUM TC 99M TETROFOSMIN IV KIT
13.5900 | PACK | Freq: Once | INTRAVENOUS | Status: AC | PRN
  Administered 2017-06-11: 13.59 via INTRAVENOUS

## 2017-06-11 MED ORDER — REGADENOSON 0.4 MG/5ML IV SOLN
0.4000 mg | Freq: Once | INTRAVENOUS | Status: AC
  Administered 2017-06-11: 0.4 mg via INTRAVENOUS

## 2017-06-11 MED ORDER — SODIUM CHLORIDE 0.9 % WEIGHT BASED INFUSION: 1.0000 mL/kg/h | INTRAVENOUS | Status: DC

## 2017-06-11 MED ORDER — SODIUM CHLORIDE 0.9 % WEIGHT BASED INFUSION
3.0000 mL/kg/h | INTRAVENOUS | Status: AC
  Administered 2017-06-12: 3 mL/kg/h via INTRAVENOUS

## 2017-06-11 MED ORDER — SODIUM CHLORIDE 0.9 % IV SOLN: 250.0000 mL | INTRAVENOUS | Status: DC | PRN

## 2017-06-11 MED ORDER — TECHNETIUM TC 99M TETROFOSMIN IV KIT
30.0000 | PACK | Freq: Once | INTRAVENOUS | Status: AC | PRN
  Administered 2017-06-11: 31.73 via INTRAVENOUS

## 2017-06-11 NOTE — Progress Notes (Signed)
Patient ID: Kathryn Wise, female   DOB: [birth year], 83 y.o.   MRN: [id number]  [facility] PROGRESS NOTE  Kathryn Wise AOZ:[id number] DOB: August 22, 1929 DOA: 06/10/2017 PCP: [name], MD  HPI/Subjective: Patient came in with left-sided chest pain and exertional dyspnea.  Patient's stress test came back positive.  Objective: Vitals:   06/11/17 0725 06/11/17 1125  BP: 131/72 131/63  Pulse: 71 86  Resp: 18 18  Temp:    SpO2: 96% 97%    Filed Weights   06/10/17 0902 06/11/17 0700  Weight: 68 kg (150 lb) 69.6 kg (153 lb 6.4 oz)    ROS: Review of Systems  Constitutional: Negative for chills and fever.  Eyes: Negative for blurred vision.  Respiratory: Positive for shortness of breath. Negative for cough.   Cardiovascular: Negative for chest pain.  Gastrointestinal: Negative for abdominal pain, constipation, diarrhea, nausea and vomiting.  Genitourinary: Negative for dysuria.  Musculoskeletal: Negative for joint pain.  Neurological: Negative for dizziness and headaches.   Exam: Physical Exam  Constitutional: She is oriented to person, place, and time.  HENT:  Nose: No mucosal edema.  Mouth/Throat: No oropharyngeal exudate or posterior oropharyngeal edema.  Eyes: Conjunctivae, EOM and lids are normal. Pupils are equal, round, and reactive to light.  Neck: No JVD present. Carotid bruit is not present. No edema present. No thyroid mass and no thyromegaly present.  Cardiovascular: S1 normal and S2 normal. Exam reveals no gallop.  Murmur heard.  Systolic murmur is present with a grade of 2/6. Pulses:      Dorsalis pedis pulses are 2+ on the right side, and 2+ on the left side.  Respiratory: No respiratory distress. She has no wheezes. She has no rhonchi. She has no rales.  GI: Soft. Bowel sounds are normal. There is no tenderness.  Musculoskeletal:       Right ankle: She exhibits no swelling.       Left ankle: She exhibits no swelling.  Lymphadenopathy:    She has  no cervical adenopathy.  Neurological: She is alert and oriented to person, place, and time. No cranial nerve deficit.  Skin: Skin is warm. No rash noted. Nails show no clubbing.  Psychiatric: She has a normal mood and affect.      Data Reviewed: Basic Metabolic Panel: Recent Labs  Lab 06/10/17 0902 06/10/17 1141 06/11/17 0451  NA 134*  --  136  K 4.5  --  4.3  CL 104  --  104  CO2 22  --  26  GLUCOSE 144*  --  96  BUN 23*  --  21*  CREATININE 1.11*  --  0.95  CALCIUM 9.8  --  8.8*  MG  --  2.1  --    Liver Function Tests: Recent Labs  Lab 06/10/17 0902  AST 28  ALT 13*  ALKPHOS 39  BILITOT 0.8  PROT 6.7  ALBUMIN 3.5   Recent Labs  Lab 06/10/17 0902  LIPASE 33   CBC: Recent Labs  Lab 06/10/17 0902 06/11/17 0451  WBC 7.3 7.8  HGB 13.0 12.3  HCT 39.0 38.0  MCV 85.7 86.5  PLT 178 162   Cardiac Enzymes: Recent Labs  Lab 06/10/17 0902 06/10/17 1141  TROPONINI <0.03 <0.03    CBG: Recent Labs  Lab 06/11/17 0757 06/11/17 1200  GLUCAP 103* 156*     Studies: Dg Chest 2 View  Result Date: 06/10/2017 CLINICAL DATA:  Heartburn last night.  Chest pain.  Short of breath.  EXAM: CHEST  2 VIEW COMPARISON:  05/28/2016 FINDINGS: Normal heart size. Lungs are hyperaerated and clear. No pneumothorax. No pleural effusion. Osteopenia. IMPRESSION: No active cardiopulmonary disease. Electronically Signed   By: Marybelle Killings M.D.   On: 06/10/2017 09:45   Ct Angio Chest Pe W And/or Wo Contrast  Result Date: 06/10/2017 CLINICAL DATA:  83 year old female with exertional shortness of breath over the past few days. Indigestion. Subsequent encounter. EXAM: CT ANGIOGRAPHY CHEST WITH CONTRAST TECHNIQUE: Multidetector CT imaging of the chest was performed using the standard protocol during bolus administration of intravenous contrast. Multiplanar CT image reconstructions and MIPs were obtained to evaluate the vascular anatomy. CONTRAST:  26mL ISOVUE-370 IOPAMIDOL (ISOVUE-370)  INJECTION 76% COMPARISON:  06/10/2017 chest x-ray.  11/26/2012 chest CT. FINDINGS: Cardiovascular:  No pulmonary embolus. Exam not optimized to evaluate aorta however no aortic dissection identified. Atherosclerotic changes thoracic and upper abdominal aorta with narrowing of the origin of the celiac artery. Atherosclerotic changes great vessels. Heart size top-normal. Coronary artery calcifications. Calcification mitral and aortic valve. Possible calcification left ventricular apex. Mediastinum/Nodes: Top-normal size subcarinal lymph nodes. Small or moderate-size hiatal hernia. Lungs/Pleura: Scattered areas of scarring without worrisome lung abnormality. Trachea and mainstem bronchi are patent. Upper Abdomen: No worrisome abnormality. Musculoskeletal: No osseous destructive lesion. Post left shoulder replacement. Review of the MIP images confirms the above findings. IMPRESSION: No pulmonary embolus. No aortic dissection. Coronary artery calcifications. Small to moderate-size hiatal hernia. Aortic Atherosclerosis (ICD10-I70.0). Electronically Signed   By: Genia Del M.D.   On: 06/10/2017 12:52   Nm Myocar Multi W/spect W/wall Motion / Ef  Result Date: 06/11/2017  Abnormal, potentially high risk myocardial perfusion stress test.  There is a moderate in size, mild in severity, reversible defect involving the mid inferolateral, apical lateral, and apical segments consistent with ischemia.  There is a small in size, mild in severity, reversible defect involving the mid anterior segment, which may represent subtle ischemia and/or shifting breast attenuation.  The left ventricular ejection fraction is normal (64%).  Transient ischemic dilation is noted (TID 1.31), which is non-specific but can be seen balanced/multivessel ischemia.     Scheduled Meds: . aspirin EC  81 mg Oral Daily  . atorvastatin  40 mg Oral q1800  . cholecalciferol  1,000 Units Oral Daily  . cycloSPORINE  1 drop Both Eyes BID  .  docusate sodium  100 mg Oral BID  . enoxaparin (LOVENOX) injection  40 mg Subcutaneous Q24H  . levothyroxine  88 mcg Oral QAC breakfast  . lisinopril  20 mg Oral Daily  . nitroGLYCERIN  0.5 inch Topical Q6H  . pantoprazole  40 mg Oral Daily  . potassium chloride  5 mEq Oral Daily  . raloxifene  60 mg Oral Daily  . sodium chloride flush  3 mL Intravenous Q12H  . vitamin B-12  100 mcg Oral Daily   Continuous Infusions: . sodium chloride    . [START ON 06/12/2017] sodium chloride     Followed by  . [START ON 06/12/2017] sodium chloride      Assessment/Plan:  1. Angina with shortness of breath with exertion and chest discomfort.  Patient had a positive stress test.  Cardiac catheterization to be done tomorrow.  Long discussion with daughter and patient at the bedside about this test. 2. Hyperlipidemia unspecified on atorvastatin 3. Hypothyroidism unspecified on levothyroxine 4. Essential hypertension on lisinopril 5. GERD on Protonix 6. Osteoporosis on raloxifene 7. History of polymyalgia rheumatica  Code Status:  Code Status Orders  (From admission, onward)        Start     Ordered   06/10/17 1634  Full code  Continuous     06/10/17 1635    Code Status History    Date Active Date Inactive Code Status Order ID Comments User Context   This patient has a current code status but no historical code status.    Advance Directive Documentation     Most Recent Value  Type of Advance Directive  Living will, Healthcare Power of Attorney  Pre-existing out of facility DNR order (yellow form or pink MOST form)  No data  "MOST" Form in Place?  No data     Family Communication: Daughter at the bedside Disposition Plan: Potentially home tomorrow if cardiac catheterization is negative or Thursday if the patient has a stent  Consultants:  Cardiology  Time spent: 28 minutes  [place], [place]

## 2017-06-11 NOTE — Consult Note (Signed)
Cardiology Consultation:   Patient ID: Kathryn Wise; [id number]; [birth year]   Admit date: 06/10/2017 Date of Consult: 06/11/2017  Primary Care Provider: [name], MD Primary Cardiologist: New to [facility] - consult by End   Patient Profile:   Kathryn Wise is a 83 y.o. female with a hx of aortic sclerosis, HTN, polymyalgia rheumatica, HLD, and hypothyroidism who is being seen today for the evaluation of chest pain and dyspnea at the request of Dr. Vianne Bulls, MD.  History of Present Illness:   Kathryn Wise was previously seen by Dr. [name] for evaluation of cardiac murmur in the spring of 2017. At that time, she underwent TTE that showed EF 60-65%, no RWMA, mild concentric LVH with mild LVOT gradient, Gr1DD, mild MR, RV systolic function was normal. PASP normal. She reports previously undergoing stress tests with her PCP as part of physicals though none recently. At baseline she is fairly acitve. She was in her usual state of health until several days ago when she began is develop exertional SOB. This was significantly exacerbated on 11/10 when she was lifting heavy flower pots into her cart. She had to sit down and rest. There was some chest tightess as well. Since 11/10, she has noted exertional dyspnea that has limited her ADLs. She was getting ready for her usual swim aerobics on 11/12 when she developed exertional dyspnea with associated chest tightness. Symptoms lasted ~ 30 minutes before self resolution with rest. She has been symptom-free since. No associated diaphoresis, nausea, vomiting, palpitations, dizziness, presyncope, or syncope.   Upon the patient's arrival to [facility] they were found to have stable vitals. EKG showed NSR with significant LVH with early repolarization as below, CXR showed no acute process. CTA chest was negative for PE and aortic dissection with coronary artery calcifications. Labs showed troponin negative x 2, WBC 7.3, HGB 13.0, PLT 178, SCr 1.11-->0.95, K+ 4.5,  Mg++ 2.1, lipase 33. She given ASA and nitropaste in the ED. She has been symptom free since her arrival.   Past Medical History:  Diagnosis Date  . Arthritis   . GERD (gastroesophageal reflux disease)   . Heart murmur    has had years and years  . History of shingles Dec 2013   treated with steroids , post op from shoulder surgery  . Hypertension   . Hypothyroidism   . Osteoarthritis of left shoulder 07/14/2012  . Polymyalgia rheumatica (HCC)     Past Surgical History:  Procedure Laterality Date  . ARTHOSCOPIC ROTAOR CUFF REPAIR     LEFT  10+  YEARS    . CATARACT EXTRACTION W/ INTRAOCULAR LENS IMPLANT     RIGHT EYE  . FACELIFT    . FOOT SURGERY     rt foot   TUMOR REMOVED  . JOINT REPLACEMENT Left Dec 2013   shoulder  . SKIN CANCER EXCISION       Home Meds: Prior to Admission medications   Medication Sig Start Date End Date Taking? Authorizing Provider  ALPRAZolam (XANAX) 0.25 MG tablet Take 1 tablet (0.25 mg total) by mouth at bedtime as needed for anxiety. 03/27/17  Yes [name], MD  aspirin 81 MG tablet Take 81 mg by mouth daily.   Yes [provider]  Cholecalciferol (VITAMIN D-3) 1000 units CAPS Take 1 capsule by mouth daily.   Yes [provider]  Cyanocobalamin (VITAMIN B 12) 100 MCG LOZG Take 1 tablet by mouth daily.    Yes [provider]  furosemide (LASIX) 20 MG tablet Take 1 tablet (20 mg total) by mouth daily. As needed for fluid retention 05/30/16  Yes [name], MD  levothyroxine (SYNTHROID, LEVOTHROID) 88 MCG tablet TAKE 1 TABLET BY MOUTH ONCE DAILY BEFOREBREAKFAST 08/18/16  Yes [name], MD  lisinopril (PRINIVIL,ZESTRIL) 20 MG tablet TAKE 1 TABLET BY MOUTH ONCE DAILY 04/02/17  Yes [name], MD  omeprazole (PRILOSEC) 20 MG capsule TAKE 1 CAPSULE BY MOUTH ONCE EVERY MORNING BEFORE BREAKFAST 02/13/17  Yes [name], MD  POTASSIUM PO Take 1 capsule by mouth daily.   Yes [provider]  raloxifene  (EVISTA) 60 MG tablet TAKE 1 TABLET BY MOUTH ONCE DAILY 01/31/17  Yes [name], MD  RESTASIS 0.05 % ophthalmic emulsion Place 1 drop into both eyes 2 (two) times daily.  10/22/15  Yes [provider]    Inpatient Medications: Scheduled Meds: . aspirin EC  81 mg Oral Daily  . cholecalciferol  1,000 Units Oral Daily  . cycloSPORINE  1 drop Both Eyes BID  . docusate sodium  100 mg Oral BID  . enoxaparin (LOVENOX) injection  40 mg Subcutaneous Q24H  . levothyroxine  88 mcg Oral QAC breakfast  . lisinopril  20 mg Oral Daily  . nitroGLYCERIN  0.5 inch Topical Q6H  . pantoprazole  40 mg Oral Daily  . potassium chloride  5 mEq Oral Daily  . raloxifene  60 mg Oral Daily  . vitamin B-12  100 mcg Oral Daily   Continuous Infusions:  PRN Meds: acetaminophen **OR** acetaminophen, bisacodyl, HYDROcodone-acetaminophen, ondansetron **OR** ondansetron (ZOFRAN) IV, traZODone  Allergies:   Allergies  Allergen Reactions  . Alendronate Swelling    And nausea   . Oxycontin [Oxycodone Hcl]     Altered mental status    Social History:   Social History   Socioeconomic History  . Marital status: Widowed    Spouse name: Not on file  . Number of children: Not on file  . Years of education: Not on file  . Highest education level: Not on file  Social Needs  . Financial resource strain: Not on file  . Food insecurity - worry: Not on file  . Food insecurity - inability: Not on file  . Transportation needs - medical: Not on file  . Transportation needs - non-medical: Not on file  Occupational History  . Not on file  Tobacco Use  . Smoking status: Former Smoker    Types: Cigarettes    Last attempt to quit: 07/30/1972    Years since quitting: 44.8  . Smokeless tobacco: Never Used  . Tobacco comment: smoked for only a few months  Substance and Sexual Activity  . Alcohol use: Yes    Comment: OCCAS WINE   . Drug use: No  . Sexual activity: No  Other Topics Concern  . Not on file    Social History Narrative  . Not on file     Family History:   Family History  Problem Relation Age of Onset  . Cancer Daughter        Breast  . Breast cancer Daughter 23  . Cancer Son     ROS:  ROS    Physical Exam/Data:   Vitals:   06/10/17 1630 06/10/17 1818 06/10/17 1928 06/11/17 0700  BP: (!) 143/72 (!) 154/133 (!) 146/46 (!) 129/54  Pulse:  77 74 71  Resp: 17 18 18 18   Temp:  98.4 F (36.9 C) 97.8  F (36.6 C) 98.5 F (36.9 C)  TempSrc:  Oral Oral Oral  SpO2:  96% 92% 91%  Weight:    153 lb 6.4 oz (69.6 kg)  Height:        Intake/Output Summary (Last 24 hours) at 06/11/2017 0923 Last data filed at 06/10/2017 1959 Gross per 24 hour  Intake -  Output 0 ml  Net 0 ml   Filed Weights   06/10/17 0902 06/11/17 0700  Weight: 150 lb (68 kg) 153 lb 6.4 oz (69.6 kg)   Body mass index is 24.76 kg/m.   Physical Exam: General: Well developed, well nourished, in no acute distress. Head: Normocephalic, atraumatic, sclera non-icteric, no xanthomas, nares without discharge. Neck: Negative for carotid bruits. JVD not elevated. Lungs: Clear bilaterally to auscultation without wheezes, rales, or rhonchi. Breathing is unlabored. Heart: RRR with S1 S2. II/VI systolic murmur RUSB, no rubs, or gallops appreciated. Abdomen: Soft, non-tender, non-distended with normoactive bowel sounds. No hepatomegaly. No rebound/guarding. No obvious abdominal masses. Msk:  Strength and tone appear normal for age. Extremities: No clubbing or cyanosis. No edema. Distal pedal pulses are 2+ and equal bilaterally. Neuro: Alert and oriented X 3. No facial asymmetry. No focal deficit. Moves all extremities spontaneously. Psych:  Responds to questions appropriately with a normal affect.   EKG:  The EKG was personally reviewed and demonstrates: NSR, 96 bpm, LVH with early repolarization, occasional PVCs, nonspecific st/t changes Telemetry:  Telemetry was personally reviewed and demonstrates:  NSR  Weights: Filed Weights   06/10/17 0902 06/11/17 0700  Weight: 150 lb (68 kg) 153 lb 6.4 oz (69.6 kg)    Relevant CV Studies: Echo 11/2015: Study Conclusions  - Left ventricle: The cavity size was normal. There was mild focal   basal and mild concentric hypertrophy of the septum, with mild   LVOT gradient. Systolic function was normal. The estimated   ejection fraction was in the range of 60% to 65%. Wall motion was   normal; there were no regional wall motion abnormalities. Doppler   parameters are consistent with abnormal left ventricular   relaxation (grade 1 diastolic dysfunction). - Aorta: Aortic root with milldy dilated, dimension: 35 mm (ED).   Ascending aorta was mildly dilated, diameter: 35 mm (S). - Mitral valve: There was mild regurgitation. - Left atrium: The atrium was normal in size. - Right ventricle: Systolic function was normal. - Pulmonary arteries: Systolic pressure was within the normal   range. - Inferior vena cava: The vessel was normal in size. The   respirophasic diameter changes were in the normal range (>= 50%),   consistent with normal central venous pressure.  Impressions:  - Murmur likely secondary to aortic valve sclerosis without   significant stenosis, and mild LVOT gradient.  Laboratory Data:  Chemistry Recent Labs  Lab 06/10/17 0902 06/11/17 0451  NA 134* 136  K 4.5 4.3  CL 104 104  CO2 22 26  GLUCOSE 144* 96  BUN 23* 21*  CREATININE 1.11* 0.95  CALCIUM 9.8 8.8*  GFRNONAA 44* 53*  GFRAA 51* >60  ANIONGAP 8 6    Recent Labs  Lab 06/10/17 0902  PROT 6.7  ALBUMIN 3.5  AST 28  ALT 13*  ALKPHOS 39  BILITOT 0.8   Hematology Recent Labs  Lab 06/10/17 0902 06/11/17 0451  WBC 7.3 7.8  RBC 4.55 4.39  HGB 13.0 12.3  HCT 39.0 38.0  MCV 85.7 86.5  MCH 28.6 27.9  MCHC 33.4 32.3  RDW 15.8* 15.5*  PLT 178 162   Cardiac Enzymes Recent Labs  Lab 06/10/17 0902 06/10/17 1141  TROPONINI <0.03 <0.03   No results  for input(s): TROPIPOC in the last 168 hours.  BNPNo results for input(s): BNP, PROBNP in the last 168 hours.  DDimer No results for input(s): DDIMER in the last 168 hours.  Radiology/Studies:  Dg Chest 2 View  Result Date: 06/10/2017 IMPRESSION: No active cardiopulmonary disease. Electronically Signed   By: Marybelle Killings M.D.   On: 06/10/2017 09:45   Ct Angio Chest Pe W And/or Wo Contrast  Result Date: 06/10/2017 IMPRESSION: No pulmonary embolus. No aortic dissection. Coronary artery calcifications. Small to moderate-size hiatal hernia. Aortic Atherosclerosis (ICD10-I70.0). Electronically Signed   By: Genia Del M.D.   On: 06/10/2017 12:52    Assessment and Plan:   1. Chest pain with moderate risk for cardiac etiology: -Currently symptom free -Ruled out -Pain was noted to be fully reproducible to palpation in the ED -Patient was lifting heavy flower pots prior to the development of her symptoms -Schedule Lexiscan Myoview to evaluate for high-risk ischemia -If normal, consider non-cardiac etiology  -No indication for heparin gtt at this time -ASA -Start Lipitor as below  2. Dyspnea: -Ischemic evaluation as above -Echo pending -If normal ischemic evaluation, consider pulmonary etiology   3. Murmur: -Previously noted to have aortic sclerosis on echo as above -Echo pending  4. HLD: -Recent lipid panel 02/2017 with LDL of 137 -Start Lipitor    For questions or updates, please contact [place] Please consult [email or web address] for contact info under Cardiology/STEMI.   Signed, Christell Faith, PA-C [place] Pager: [phone] 06/11/2017, 9:23 AM

## 2017-06-11 NOTE — Care Management Obs Status (Signed)
[place] NOTIFICATION   Patient Details  Name: SIGNA CHEEK MRN: [id number] Date of Birth: [birth year]   Medicare Observation Status Notification Given:  Yes Notice signed, one given to patient and the other to HIM for scanning   Katrina Stack, RN 06/11/2017, 12:07 PM

## 2017-06-11 NOTE — Progress Notes (Signed)
.     Discussed nuclear stress test results with patient in detail. Planing for [facility] 06/12/17 with Dr. Rockey Situ. NPO after midnight. Risks and benefits of cardiac catheterization have been discussed with the patient including risks of bleeding, bruising, infection, kidney damage, stroke, heart attack, urgent need for bypass surgery, and death. The patient understands these risks and is willing to proceed with the procedure. All questions have been answered and concerns listened to.

## 2017-06-12 ENCOUNTER — Encounter
Admission: EM | Disposition: A | Discharge status: Home / Self Care | Payer: Self-pay | Source: Home / Self Care | Attending: Emergency Medicine

## 2017-06-12 ENCOUNTER — Telehealth: Payer: Self-pay

## 2017-06-12 ENCOUNTER — Encounter: Payer: Self-pay | Admitting: *Deleted

## 2017-06-12 DIAGNOSIS — I25110 Atherosclerotic heart disease of native coronary artery with unstable angina pectoris: Secondary | ICD-10-CM | POA: Diagnosis not present

## 2017-06-12 DIAGNOSIS — R079 Chest pain, unspecified: Secondary | ICD-10-CM

## 2017-06-12 DIAGNOSIS — I493 Ventricular premature depolarization: Secondary | ICD-10-CM | POA: Diagnosis not present

## 2017-06-12 DIAGNOSIS — E785 Hyperlipidemia, unspecified: Secondary | ICD-10-CM | POA: Diagnosis not present

## 2017-06-12 DIAGNOSIS — I10 Essential (primary) hypertension: Secondary | ICD-10-CM | POA: Diagnosis not present

## 2017-06-12 DIAGNOSIS — R011 Cardiac murmur, unspecified: Secondary | ICD-10-CM

## 2017-06-12 DIAGNOSIS — E039 Hypothyroidism, unspecified: Secondary | ICD-10-CM | POA: Diagnosis not present

## 2017-06-12 DIAGNOSIS — R9439 Abnormal result of other cardiovascular function study: Secondary | ICD-10-CM | POA: Diagnosis not present

## 2017-06-12 HISTORY — PX: LEFT HEART CATH AND CORONARY ANGIOGRAPHY: CATH118249

## 2017-06-12 LAB — TSH: TSH: 3.622 u[IU]/mL (ref 0.350–4.500)

## 2017-06-12 LAB — GLUCOSE, CAPILLARY: GLUCOSE-CAPILLARY: 89 mg/dL (ref 65–99)

## 2017-06-12 SURGERY — LEFT HEART CATH AND CORONARY ANGIOGRAPHY
Anesthesia: Moderate Sedation

## 2017-06-12 MED ORDER — LIDOCAINE HCL (PF) 1 % IJ SOLN
INTRAMUSCULAR | Status: AC
  Filled 2017-06-12: qty 30

## 2017-06-12 MED ORDER — METOPROLOL TARTRATE 25 MG PO TABS: 25.0000 mg | ORAL_TABLET | Freq: Two times a day (BID) | ORAL | 1 refills | Status: DC

## 2017-06-12 MED ORDER — MIDAZOLAM HCL 2 MG/2ML IJ SOLN
INTRAMUSCULAR | Status: DC | PRN
  Administered 2017-06-12: 1 mg via INTRAVENOUS

## 2017-06-12 MED ORDER — HEPARIN (PORCINE) IN NACL 2-0.9 UNIT/ML-% IJ SOLN
INTRAMUSCULAR | Status: AC
  Filled 2017-06-12: qty 1000

## 2017-06-12 MED ORDER — ATORVASTATIN CALCIUM 40 MG PO TABS: 40.0000 mg | ORAL_TABLET | Freq: Every day | ORAL | 1 refills | Status: DC

## 2017-06-12 MED ORDER — ROSUVASTATIN CALCIUM 20 MG PO TABS
20.0000 mg | ORAL_TABLET | Freq: Every day | ORAL | Status: DC
  Filled 2017-06-12: qty 1

## 2017-06-12 MED ORDER — IOPAMIDOL (ISOVUE-300) INJECTION 61%
INTRAVENOUS | Status: DC | PRN
  Administered 2017-06-12: 105 mL via INTRA_ARTERIAL

## 2017-06-12 MED ORDER — NITROGLYCERIN 0.4 MG SL SUBL: 0.4000 mg | SUBLINGUAL_TABLET | SUBLINGUAL | Status: DC | PRN

## 2017-06-12 MED ORDER — LIDOCAINE HCL (PF) 1 % IJ SOLN
INTRAMUSCULAR | Status: DC | PRN
  Administered 2017-06-12: 18 mL via INTRADERMAL

## 2017-06-12 MED ORDER — FENTANYL CITRATE (PF) 100 MCG/2ML IJ SOLN
INTRAMUSCULAR | Status: DC | PRN
  Administered 2017-06-12: 25 ug via INTRAVENOUS

## 2017-06-12 MED ORDER — MIDAZOLAM HCL 2 MG/2ML IJ SOLN
INTRAMUSCULAR | Status: AC
  Filled 2017-06-12: qty 2

## 2017-06-12 MED ORDER — FENTANYL CITRATE (PF) 100 MCG/2ML IJ SOLN
INTRAMUSCULAR | Status: AC
  Filled 2017-06-12: qty 2

## 2017-06-12 MED ORDER — METOPROLOL TARTRATE 25 MG PO TABS
25.0000 mg | ORAL_TABLET | Freq: Two times a day (BID) | ORAL | Status: DC
  Administered 2017-06-12: 25 mg via ORAL
  Filled 2017-06-12: qty 1

## 2017-06-12 MED ORDER — NITROGLYCERIN 0.4 MG SL SUBL: 0.4000 mg | SUBLINGUAL_TABLET | SUBLINGUAL | 2 refills | Status: DC | PRN

## 2017-06-12 SURGICAL SUPPLY — 8 items
CATH 5FR PIGTAIL DIAGNOSTIC (CATHETERS) ×3 IMPLANT
CATH INFINITI 5FR JL4 (CATHETERS) ×3 IMPLANT
CATH INFINITI JR4 5F (CATHETERS) ×3 IMPLANT
KIT MANI 3VAL PERCEP (MISCELLANEOUS) ×3 IMPLANT
NEEDLE PERC 18GX7CM (NEEDLE) ×3 IMPLANT
PACK CARDIAC CATH (CUSTOM PROCEDURE TRAY) ×3 IMPLANT
SHEATH PINNACLE 5F 10CM (SHEATH) ×3 IMPLANT
WIRE EMERALD 3MM-J .035X150CM (WIRE) ×3 IMPLANT

## 2017-06-12 NOTE — Discharge Summary (Signed)
[facility] [place] NAME: Kathryn Wise    MR#:  [id number]  DATE OF BIRTH:  1930/03/19  DATE OF ADMISSION:  06/10/2017   ADMITTING PHYSICIAN: Epifanio Lesches, MD  DATE OF DISCHARGE: 06/12/2017  PRIMARY CARE PHYSICIAN: [name], MD   ADMISSION DIAGNOSIS:  Chest pain, unspecified type [R07.9] DISCHARGE DIAGNOSIS:  Active Problems:   Chest pain  SECONDARY DIAGNOSIS:   Past Medical History:  Diagnosis Date  . Arthritis   . GERD (gastroesophageal reflux disease)   . Heart murmur    has had years and years  . History of shingles Dec 2013   treated with steroids , post op from shoulder surgery  . Hypertension   . Hypothyroidism   . Osteoarthritis of left shoulder 07/14/2012  . Polymyalgia rheumatica [facility])    HOSPITAL COURSE:   83 year old female patient with left-sided chest pain, exertional dyspnea, patient troponins are negative for 2 times but she still has occasional dyspnea, CT angios concerning for coronary artery calcifications.    Unstable angina: S/p left heart catheterization today:  Mid RCA lesion is 50% stenosed.  Prox Cx to Mid Cx lesion is 55% stenosed.  Ost LM to Mid LM lesion is 30% stenosed.  The left ventricular ejection fraction is greater than 65% by visual estimate.  LV end diastolic pressure is normal.  There is hyperdynamic left ventricular systolic function. continue aspirin, nitrates, Start metoprolol tartrate 25 mg po BID, echocardiograph as outpatient per Dr. Rockey Situ.  HLP. Continue Lipitor. Hypertension.  Continue home hypertension medication. DISCHARGE CONDITIONS:  Stable, discharge to home today. CONSULTS OBTAINED:  Treatment Team:  Nelva Bush, MD DRUG ALLERGIES:   Allergies  Allergen Reactions  . Alendronate Swelling    And nausea   . Oxycontin [Oxycodone Hcl]     Altered mental status   DISCHARGE MEDICATIONS:   Allergies as of 06/12/2017      Reactions   Alendronate Swelling   And nausea    Oxycontin [oxycodone Hcl]    Altered mental status      Medication List    TAKE these medications   ALPRAZolam 0.25 MG tablet Commonly known as:  XANAX Take 1 tablet (0.25 mg total) by mouth at bedtime as needed for anxiety.   aspirin 81 MG tablet Take 81 mg by mouth daily.   atorvastatin 40 MG tablet Commonly known as:  LIPITOR Take 1 tablet (40 mg total) daily at 6 PM by mouth.   furosemide 20 MG tablet Commonly known as:  LASIX Take 1 tablet (20 mg total) by mouth daily. As needed for fluid retention   levothyroxine 88 MCG tablet Commonly known as:  SYNTHROID, LEVOTHROID TAKE 1 TABLET BY MOUTH ONCE DAILY BEFOREBREAKFAST   lisinopril 20 MG tablet Commonly known as:  PRINIVIL,ZESTRIL TAKE 1 TABLET BY MOUTH ONCE DAILY   metoprolol tartrate 25 MG tablet Commonly known as:  LOPRESSOR Take 1 tablet (25 mg total) 2 (two) times daily by mouth.   nitroGLYCERIN 0.4 MG SL tablet Commonly known as:  NITROSTAT Place 1 tablet (0.4 mg total) every 5 (five) minutes as needed under the tongue for chest pain.   omeprazole 20 MG capsule Commonly known as:  PRILOSEC TAKE 1 CAPSULE BY MOUTH ONCE EVERY MORNING BEFORE BREAKFAST   POTASSIUM PO Take 1 capsule by mouth daily.   raloxifene 60 MG tablet Commonly known as:  EVISTA TAKE 1 TABLET BY MOUTH ONCE DAILY   RESTASIS 0.05 % ophthalmic emulsion  Generic drug:  cycloSPORINE Place 1 drop into both eyes 2 (two) times daily.   Vitamin B 12 100 MCG Lozg Take 1 tablet by mouth daily.   Vitamin D-3 1000 units Caps Take 1 capsule by mouth daily.        DISCHARGE INSTRUCTIONS:  See AVS  If you experience worsening of your admission symptoms, develop shortness of breath, life threatening emergency, suicidal or homicidal thoughts you must seek medical attention immediately by calling 911 or calling your MD immediately  if symptoms less severe.  You Must read complete instructions/literature  along with all the possible adverse reactions/side effects for all the Medicines you take and that have been prescribed to you. Take any new Medicines after you have completely understood and accpet all the possible adverse reactions/side effects.   Please note  You were cared for by a hospitalist during your hospital stay. If you have any questions about your discharge medications or the care you received while you were in the hospital after you are discharged, you can call the unit and asked to speak with the hospitalist on call if the hospitalist that took care of you is not available. Once you are discharged, your primary care physician will handle any further medical issues. Please note that NO REFILLS for any discharge medications will be authorized once you are discharged, as it is imperative that you return to your primary care physician (or establish a relationship with a primary care physician if you do not have one) for your aftercare needs so that they can reassess your need for medications and monitor your lab values.    On the day of Discharge:  VITAL SIGNS:  Blood pressure 139/61, pulse 63, temperature 98.4 F (36.9 C), resp. rate (!) 21, height 5\' 6"  (1.676 m), weight 150 lb 11.2 oz (68.4 kg), SpO2 97 %. PHYSICAL EXAMINATION:  GENERAL:  84 y.o.-year-old patient lying in the bed with no acute distress.  EYES: Pupils equal, round, reactive to light and accommodation. No scleral icterus. Extraocular muscles intact.  HEENT: Head atraumatic, normocephalic. Oropharynx and nasopharynx clear.  NECK:  Supple, no jugular venous distention. No thyroid enlargement, no tenderness.  LUNGS: Normal breath sounds bilaterally, no wheezing, rales,rhonchi or crepitation. No use of accessory muscles of respiration.  CARDIOVASCULAR: S1, S2 normal. No murmurs, rubs, or gallops.  ABDOMEN: Soft, non-tender, non-distended. Bowel sounds present. No organomegaly or mass.  EXTREMITIES: No pedal edema,  cyanosis, or clubbing.  NEUROLOGIC: Cranial nerves II through XII are intact. Muscle strength 5/5 in all extremities. Sensation intact. Gait not checked.  PSYCHIATRIC: The patient is alert and oriented x 3.  SKIN: No obvious rash, lesion, or ulcer.  DATA REVIEW:   CBC Recent Labs  Lab 06/11/17 0451  WBC 7.8  HGB 12.3  HCT 38.0  PLT 162    Chemistries  Recent Labs  Lab 06/10/17 0902 06/10/17 1141 06/11/17 0451  NA 134*  --  136  K 4.5  --  4.3  CL 104  --  104  CO2 22  --  26  GLUCOSE 144*  --  96  BUN 23*  --  21*  CREATININE 1.11*  --  0.95  CALCIUM 9.8  --  8.8*  MG  --  2.1  --   AST 28  --   --   ALT 13*  --   --   ALKPHOS 39  --   --   BILITOT 0.8  --   --  Microbiology Results  Results for orders placed or performed in visit on 10/22/13  Fecal occult blood, imunochemical     Status: None   Collection Time: 10/22/13  2:34 PM  Result Value Ref Range Status   Fecal Occult Bld Negative Negative Final    RADIOLOGY:  No results found.   Management plans discussed with the patient, family and they are in agreement.  CODE STATUS: Full Code   TOTAL TIME TAKING CARE OF THIS PATIENT: 32 minutes.    Demetrios Loll M.D on 06/12/2017 at 3:55 PM  Between 7am to 6pm - Pager - [phone]  After 6pm go to [email or web address] - Proofreader  [employer]  Hospitalists  Office  [phone]  CC: Primary care physician; [name], MD   Note: This dictation was prepared with Dragon dictation along with smaller phrase technology. Any transcriptional errors that result from this process are unintentional.

## 2017-06-12 NOTE — Plan of Care (Signed)
Patient is NPO overnight for scheduled cardiac catheterization. Both groin is prep. Patient has no acute event overnight, denied chest pain, NSR, VSS. Patient's daughter at her bedside overnight.

## 2017-06-12 NOTE — Progress Notes (Signed)
Progress Note  Patient Name: Kathryn Wise Date of Encounter: 06/12/2017  Primary Cardiologist: New to [facility] - consult by End   Subjective   No acute overnight events. No chest pain or SOB. For LHC this morning.   Inpatient Medications    Scheduled Meds: . aspirin EC  81 mg Oral Daily  . atorvastatin  40 mg Oral q1800  . cholecalciferol  1,000 Units Oral Daily  . cycloSPORINE  1 drop Both Eyes BID  . docusate sodium  100 mg Oral BID  . enoxaparin (LOVENOX) injection  40 mg Subcutaneous Q24H  . levothyroxine  88 mcg Oral QAC breakfast  . lisinopril  20 mg Oral Daily  . nitroGLYCERIN  0.5 inch Topical Q6H  . pantoprazole  40 mg Oral Daily  . potassium chloride  5 mEq Oral Daily  . raloxifene  60 mg Oral Daily  . sodium chloride flush  3 mL Intravenous Q12H  . vitamin B-12  100 mcg Oral Daily   Continuous Infusions: . sodium chloride    . sodium chloride     PRN Meds: sodium chloride, acetaminophen **OR** acetaminophen, bisacodyl, HYDROcodone-acetaminophen, ondansetron **OR** ondansetron (ZOFRAN) IV, sodium chloride flush, traZODone   Vital Signs    Vitals:   06/11/17 2019 06/11/17 2338 06/12/17 0457 06/12/17 0509  BP: (!) 121/52 (!) 108/49 109/63   Pulse: 73 72 82   Resp: 18  18   Temp: 98.2 F (36.8 C)  97.8 F (36.6 C)   TempSrc: Oral  Oral   SpO2: 97%  96%   Weight:    150 lb 11.2 oz (68.4 kg)  Height:        Intake/Output Summary (Last 24 hours) at 06/12/2017 0701 Last data filed at 06/12/2017 0509 Gross per 24 hour  Intake -  Output 1550 ml  Net -1550 ml   Filed Weights   06/10/17 0902 06/11/17 0700 06/12/17 0509  Weight: 150 lb (68 kg) 153 lb 6.4 oz (69.6 kg) 150 lb 11.2 oz (68.4 kg)    Telemetry    NSR occasional PVCs - Personally Reviewed  ECG    n/a - Personally Reviewed  Physical Exam   GEN: No acute distress.   Neck: No JVD. Cardiac: RRR, II/VI systolic murmur RUSB, no rubs, or gallops.  Respiratory: Clear to auscultation  bilaterally.  GI: Soft, nontender, non-distended.   MS: No edema; No deformity. Neuro:  Alert and oriented x 3; Nonfocal.  Psych: Normal affect.  Labs    Chemistry Recent Labs  Lab 06/10/17 0902 06/11/17 0451  NA 134* 136  K 4.5 4.3  CL 104 104  CO2 22 26  GLUCOSE 144* 96  BUN 23* 21*  CREATININE 1.11* 0.95  CALCIUM 9.8 8.8*  PROT 6.7  --   ALBUMIN 3.5  --   AST 28  --   ALT 13*  --   ALKPHOS 39  --   BILITOT 0.8  --   GFRNONAA 44* 53*  GFRAA 51* >60  ANIONGAP 8 6     Hematology Recent Labs  Lab 06/10/17 0902 06/11/17 0451  WBC 7.3 7.8  RBC 4.55 4.39  HGB 13.0 12.3  HCT 39.0 38.0  MCV 85.7 86.5  MCH 28.6 27.9  MCHC 33.4 32.3  RDW 15.8* 15.5*  PLT 178 162    Cardiac Enzymes Recent Labs  Lab 06/10/17 0902 06/10/17 1141  TROPONINI <0.03 <0.03   No results for input(s): TROPIPOC in the last 168 hours.   BNPNo  results for input(s): BNP, PROBNP in the last 168 hours.   DDimer No results for input(s): DDIMER in the last 168 hours.   Radiology    Dg Chest 2 View  Result Date: 06/10/2017 IMPRESSION: No active cardiopulmonary disease. Electronically Signed   By: Marybelle Killings M.D.   On: 06/10/2017 09:45   Ct Angio Chest Pe W And/or Wo Contrast  Result Date: 06/10/2017 IMPRESSION: No pulmonary embolus. No aortic dissection. Coronary artery calcifications. Small to moderate-size hiatal hernia. Aortic Atherosclerosis (ICD10-I70.0). Electronically Signed   By: Genia Del M.D.   On: 06/10/2017 12:52   Nm Myocar Multi W/spect W/wall Motion / Ef  Result Date: 06/11/2017  Abnormal, potentially high risk myocardial perfusion stress test.  There is a moderate in size, mild in severity, reversible defect involving the mid inferolateral, apical lateral, and apical segments consistent with ischemia.  There is a small in size, mild in severity, reversible defect involving the mid anterior segment, which may represent subtle ischemia and/or shifting breast  attenuation.  The left ventricular ejection fraction is normal (64%).  Transient ischemic dilation is noted (TID 1.31), which is non-specific but can be seen balanced/multivessel ischemia.     Cardiac Studies   Echo pending Myoview as above  Patient Profile     83 y.o. female with history of aortic sclerosis, HTN, polymyalgia rheumatica, HLD, and hypothyroidism who is being seen today for the evaluation of chest pain and dyspnea at the request of Dr. Vianne Bulls, MD.  Assessment & Plan    1. Unstable angina: -Currently symptom free -Ruled out -Lexiscan Myoview abnormal as above -Dyspnea as below may be her anginal equivalent  -For LHC today -ASA -Lipitor as below -Nitropaste -Start BB s/p LHC -Risks and benefits of cardiac catheterization have been discussed with the patient including risks of bleeding, bruising, infection, kidney damage, stroke, heart attack, and death. The patient understands these risks and is willing to proceed with the procedure. All questions have been answered and concerns listened to  2. Dyspnea: -Ischemic evaluation as above -Possibly her anginal equivalent  -Echo pending  3. Murmur: -Previously noted to have aortic sclerosis on echo as above -Echo pending  4. HLD: -Recent lipid panel 02/2017 with LDL of 137 -Lipitor  -Will need follow up outpatient lipid and LFT in 6-8 weeks  5. PVCs: -Start BB s/p LHC -Magnesium and potassium at goal -Check TSH   For questions or updates, please contact [place] Please consult [email or web address] for contact info under Cardiology/STEMI.    Signed, Christell Faith, PA-C [place] Pager: [phone] 06/12/2017, 7:01 AM   Attending Note Patient seen and examined, agree with detailed note above,  Patient presentation and plan discussed on rounds.    EKG lab work, chest x-ray, echocardiogram, cath reviewed independently by myself  Tolerated cardiac cath, Moderate LCX and RCA disease LVH  noted, significant ectopy, PVC and hyperdynamic LV function  On exam no JVD, heart sounds regular, lungs clear bilaterally, abdomen soft nontender, no significant lower extremity edema  Lab work reviewed showing potassium 4.3, creatinine 0.95, hemoglobin 12 Negative troponin x3 Total cholesterol 201 with LDL 126  A/P: 1) CAD Moderate left circumflex and RCA disease Significant PVCs on monitor and during cardiac catheterization Aspirin, statin, beta-blocker No further testing needed  2) PVCs Discussed with patient, LVH on LV gram Would start metoprolol 25 twice daily with titration upwards as an outpatient as tolerated  3) murmur Likely secondary to LVH and mild outflow tract gradient Hyperdynamic LV  function with cavity obliteration  4) chest pain Moderate coronary disease, no critical lesions, aggressive medical management  5) hyperlipidemia Needs more aggressive lipid management, Would start a statin, goal LDL less than 70  Greater than 50% was spent in counseling and coordination of care with patient Total encounter time 25 minutes or more   Signed: Esmond Plants  M.D., Ph.D. [facility]

## 2017-06-12 NOTE — Telephone Encounter (Signed)
Admitted

## 2017-06-12 NOTE — Telephone Encounter (Signed)
TCM....  Patient is being discharged   They saw Kathryn Wise  They are scheduled to see Sharolyn Douglas on 12/04  They were seen for chest pain  They need to be seen within 2 weeks  Pt added to wait list   Please call

## 2017-06-12 NOTE — Progress Notes (Signed)
Discharge instructions explained to pt and pts daughter/ verbalized an understanding/ iv and tele removed/ pt ambulated around nursing station / tolerated well/ groin site WNL/  ok to discharge home before ECHo is completed per Dr. Bridgett Larsson / will have done as output.

## 2017-06-12 NOTE — Discharge Instructions (Signed)
Heart healthy diet

## 2017-06-12 NOTE — Progress Notes (Signed)
Patient clinically stable post heart cath per Dr Rockey Situ, vitals stable, no bleeding nor hematoma at right groin site. Denies complaints. Taking po's without difficulty, sinus rhythm per monitor. Daughter at bedside. Dr Rockey Situ out to speak with daughter and patient with questions answered regarding procedure and results. Report called to care nurse on telemetry with questions answered. Plan reviewed.

## 2017-06-13 NOTE — Telephone Encounter (Signed)
Patient contacted regarding discharge from [facility] on 06/12/17.  Patient understands to follow up with provider Ignacia Bayley NP on 07/02/17 at 08:30 AM at [facility]. Patient understands discharge instructions? Yes Patient understands medications and regiment? Yes Patient understands to bring all medications to this visit? Yes  Reviewed follow up appointment information with her and scheduled echocardiogram. She verbalized understanding of our conversation and had no further questions at this time.

## 2017-06-13 NOTE — Telephone Encounter (Signed)
Order entered for echocardiogram per Dr. Donivan Scull request. Doug Sou notified for scheduling.

## 2017-06-13 NOTE — Telephone Encounter (Signed)
No answer. Number rang several times with no answer and no voicemail.

## 2017-06-25 ENCOUNTER — Other Ambulatory Visit: Payer: Medicare Other

## 2017-06-26 ENCOUNTER — Ambulatory Visit (INDEPENDENT_AMBULATORY_CARE_PROVIDER_SITE_OTHER): Payer: Medicare Other

## 2017-06-26 ENCOUNTER — Other Ambulatory Visit: Payer: Self-pay

## 2017-06-26 DIAGNOSIS — R011 Cardiac murmur, unspecified: Secondary | ICD-10-CM | POA: Diagnosis not present

## 2017-06-26 DIAGNOSIS — R079 Chest pain, unspecified: Secondary | ICD-10-CM | POA: Diagnosis not present

## 2017-07-02 ENCOUNTER — Ambulatory Visit: Payer: Medicare Other | Admitting: Nurse Practitioner

## 2017-07-03 ENCOUNTER — Encounter: Payer: Self-pay | Admitting: Internal Medicine

## 2017-07-03 ENCOUNTER — Other Ambulatory Visit: Payer: Self-pay | Admitting: Internal Medicine

## 2017-07-03 ENCOUNTER — Ambulatory Visit (INDEPENDENT_AMBULATORY_CARE_PROVIDER_SITE_OTHER): Payer: Medicare Other | Admitting: Internal Medicine

## 2017-07-03 VITALS — BP 118/64 | HR 44 | Temp 97.8°F | Resp 15 | Ht 66.0 in | Wt 158.6 lb

## 2017-07-03 DIAGNOSIS — I2510 Atherosclerotic heart disease of native coronary artery without angina pectoris: Secondary | ICD-10-CM | POA: Diagnosis not present

## 2017-07-03 DIAGNOSIS — Z09 Encounter for follow-up examination after completed treatment for conditions other than malignant neoplasm: Secondary | ICD-10-CM | POA: Insufficient documentation

## 2017-07-03 DIAGNOSIS — Z23 Encounter for immunization: Secondary | ICD-10-CM

## 2017-07-03 DIAGNOSIS — Z79899 Other long term (current) drug therapy: Secondary | ICD-10-CM

## 2017-07-03 DIAGNOSIS — R0609 Other forms of dyspnea: Secondary | ICD-10-CM | POA: Diagnosis not present

## 2017-07-03 DIAGNOSIS — K449 Diaphragmatic hernia without obstruction or gangrene: Secondary | ICD-10-CM | POA: Insufficient documentation

## 2017-07-03 LAB — COMPREHENSIVE METABOLIC PANEL
ALK PHOS: 40 U/L (ref 39–117)
ALT: 11 U/L (ref 0–35)
AST: 17 U/L (ref 0–37)
Albumin: 3.9 g/dL (ref 3.5–5.2)
BILIRUBIN TOTAL: 0.7 mg/dL (ref 0.2–1.2)
BUN: 20 mg/dL (ref 6–23)
CO2: 28 mEq/L (ref 19–32)
CREATININE: 1.01 mg/dL (ref 0.40–1.20)
Calcium: 9 mg/dL (ref 8.4–10.5)
Chloride: 104 mEq/L (ref 96–112)
GFR: 55.34 mL/min — ABNORMAL LOW (ref >60.00)
GLUCOSE: 89 mg/dL (ref 70–99)
Potassium: 4.6 mEq/L (ref 3.5–5.1)
SODIUM: 137 meq/L (ref 135–145)
TOTAL PROTEIN: 6.9 g/dL (ref 6.0–8.3)

## 2017-07-03 NOTE — Patient Instructions (Addendum)
You have a small to moderate sized hiatal hernia .  Eating smaller more frequent meals will prevent it from giving you trouble   Try drinking an ounce of tonic water EVERY NIGHT to prevent cramps   You can also take one magnesium supplement in the evening which may help relax the feet/legs as well   The atorvastatin and metoprolol ARE  excellent choices to prevent heart attacks  I'll see you in 6 months ,  Sooner if needed !

## 2017-07-03 NOTE — Assessment & Plan Note (Signed)
Noted on CT .  Advised to eat smaller meals to prevent post prandial dyspnea.

## 2017-07-03 NOTE — Assessment & Plan Note (Signed)
Secondary to age.  Normal EF ,  Nonocclusive disease by diagnostic cardiac cath

## 2017-07-03 NOTE — Assessment & Plan Note (Signed)
Patient is stable post discharge and has no new issues or questions about discharge plans at the visit today for hospital follow up. All labs , imaging studies and progress notes from admission were reviewed with patient today   

## 2017-07-03 NOTE — Progress Notes (Signed)
Subjective:  Patient ID: Kathryn Wise, female    DOB: 07/24/1930  Age: 83 y.o. MRN: [id number]  CC: The primary encounter diagnosis was Long-term use of high-risk medication. Diagnoses of Encounter for immunization, Exertional dyspnea, Hiatal hernia, Coronary artery disease involving native coronary artery of native heart without angina pectoris, and Hospital discharge follow-up were also pertinent to this visit.  HPI Kathryn Wise presents for hospital follow up.  She was admitted to [facility] on Nov 12 with chest pain and shortness of breath of sudden onset,  with negative troponins but coronary calcifications on Chest CT so she underwent a diagnostic cardiac catheterization  Which noted :  50% mid RCA 55% prox Cx to mid Cx 30% ost LM  EF > 65% hyperdynamic LV function   Discharged on Nov 14 with daily asa, nitrates continued and Lopressor 25 mg BID . She feels fine, Cancelled her appt on Dec 4 with cardiology NP after having an Echo nov 28 normal   Told to continue metoprolol .      Reviewed the images and procedures done in house and out of hospital   CT chest  Was done to rule out PE and noted a moderate sized Hiatal hernia .  No aneurysm.   Has resumed exercising ,  Water aerobics.  She has migratory pain attributed to rheumatic disease   Sometimes the feet are sore,  Sometimes the scalp,  Cramps in feet all night long.    Too stiff to get into bath tub at night.  Doing water aerobics       Outpatient Medications Prior to Visit  Medication Sig Dispense Refill  . ALPRAZolam (XANAX) 0.25 MG tablet Take 1 tablet (0.25 mg total) by mouth at bedtime as needed for anxiety. 30 tablet 5  . aspirin 81 MG tablet Take 81 mg by mouth daily.    [name] atorvastatin (LIPITOR) 40 MG tablet Take 1 tablet (40 mg total) daily at 6 PM by mouth. 30 tablet 1  . Cholecalciferol (VITAMIN D-3) 1000 units CAPS Take 1 capsule by mouth daily.    . Cyanocobalamin (VITAMIN B 12) 100 MCG LOZG Take 1 tablet by  mouth daily.     . furosemide (LASIX) 20 MG tablet Take 1 tablet (20 mg total) by mouth daily. As needed for fluid retention 30 tablet 3  . levothyroxine (SYNTHROID, LEVOTHROID) 88 MCG tablet TAKE 1 TABLET BY MOUTH ONCE DAILY BEFOREBREAKFAST 90 tablet 3  . lisinopril (PRINIVIL,ZESTRIL) 20 MG tablet TAKE 1 TABLET BY MOUTH ONCE DAILY 90 tablet 1  . metoprolol tartrate (LOPRESSOR) 25 MG tablet Take 1 tablet (25 mg total) 2 (two) times daily by mouth. 60 tablet 1  . nitroGLYCERIN (NITROSTAT) 0.4 MG SL tablet Place 1 tablet (0.4 mg total) every 5 (five) minutes as needed under the tongue for chest pain. 30 tablet 2  . omeprazole (PRILOSEC) 20 MG capsule TAKE 1 CAPSULE BY MOUTH ONCE EVERY MORNING BEFORE BREAKFAST 90 capsule 2  . POTASSIUM PO Take 1 capsule by mouth daily.    . raloxifene (EVISTA) 60 MG tablet TAKE 1 TABLET BY MOUTH ONCE DAILY 30 tablet 3  . RESTASIS 0.05 % ophthalmic emulsion Place 1 drop into both eyes 2 (two) times daily.      No facility-administered medications prior to visit.     Review of Systems;  Patient denies headache, fevers, malaise, unintentional weight loss, skin rash, eye pain, sinus congestion and sinus pain, sore throat, dysphagia,  hemoptysis , cough,  dyspnea, wheezing, chest pain, palpitations, orthopnea, edema, abdominal pain, nausea, melena, diarrhea, constipation, flank pain, dysuria, hematuria, urinary  Frequency, nocturia, numbness, tingling, seizures,  Focal weakness, Loss of consciousness,  Tremor, insomnia, depression, anxiety, and suicidal ideation.      Objective:  BP 118/64 (BP Location: Left Arm, Patient Position: Sitting, Cuff Size: Normal)   Pulse (!) 44   Temp 97.8 F (36.6 C) (Oral)   Resp 15   Ht 5\' 6"  (1.676 m)   Wt 158 lb 9.6 oz (71.9 kg)   SpO2 96%   BMI 25.60 kg/m   BP Readings from Last 3 Encounters:  07/03/17 118/64  06/12/17 124/61  03/27/17 130/70    Wt Readings from Last 3 Encounters:  07/03/17 158 lb 9.6 oz (71.9 kg)    06/12/17 150 lb 11.2 oz (68.4 kg)  03/27/17 151 lb 6.4 oz (68.7 kg)    General appearance: alert, cooperative and appears stated age Ears: normal TM's and external ear canals both ears Throat: lips, mucosa, and tongue normal; teeth and gums normal Neck: no adenopathy, no carotid bruit, supple, symmetrical, trachea midline and thyroid not enlarged, symmetric, no tenderness/mass/nodules Back: symmetric, no curvature. ROM normal. No CVA tenderness. Lungs: clear to auscultation bilaterally Heart: regular rate and rhythm, S1, S2 normal, no murmur, click, rub or gallop Abdomen: soft, non-tender; bowel sounds normal; no masses,  no organomegaly Pulses: 2+ and symmetric Skin: Skin color, texture, turgor normal. No rashes or lesions Lymph nodes: Cervical, supraclavicular, and axillary nodes normal.  Lab Results  Component Value Date   HGBA1C 6.1 12/08/2015    Lab Results  Component Value Date   CREATININE 1.01 07/03/2017   CREATININE 0.95 06/11/2017   CREATININE 1.11 (H) 06/10/2017    Lab Results  Component Value Date   WBC 7.8 06/11/2017   HGB 12.3 06/11/2017   HCT 38.0 06/11/2017   PLT 162 06/11/2017   GLUCOSE 89 07/03/2017   CHOL 201 (H) 03/27/2017   TRIG 88.0 03/27/2017   HDL 47.00 03/27/2017   LDLDIRECT 126.0 09/19/2016   LDLCALC 137 (H) 03/27/2017   ALT 11 07/03/2017   AST 17 07/03/2017   NA 137 07/03/2017   K 4.6 07/03/2017   CL 104 07/03/2017   CREATININE 1.01 07/03/2017   BUN 20 07/03/2017   CO2 28 07/03/2017   TSH 3.622 06/11/2017   INR 0.98 07/09/2012   HGBA1C 6.1 12/08/2015    Dg Chest 2 View  Result Date: 06/10/2017 CLINICAL DATA:  Heartburn last night.  Chest pain.  Short of breath. EXAM: CHEST  2 VIEW COMPARISON:  05/28/2016 FINDINGS: Normal heart size. Lungs are hyperaerated and clear. No pneumothorax. No pleural effusion. Osteopenia. IMPRESSION: No active cardiopulmonary disease. Electronically Signed   By: Marybelle Killings M.D.   On: 06/10/2017 09:45    Ct Angio Chest Pe W And/or Wo Contrast  Result Date: 06/10/2017 CLINICAL DATA:  83 year old female with exertional shortness of breath over the past few days. Indigestion. Subsequent encounter. EXAM: CT ANGIOGRAPHY CHEST WITH CONTRAST TECHNIQUE: Multidetector CT imaging of the chest was performed using the standard protocol during bolus administration of intravenous contrast. Multiplanar CT image reconstructions and MIPs were obtained to evaluate the vascular anatomy. CONTRAST:  76mL ISOVUE-370 IOPAMIDOL (ISOVUE-370) INJECTION 76% COMPARISON:  06/10/2017 chest x-ray.  11/26/2012 chest CT. FINDINGS: Cardiovascular:  No pulmonary embolus. Exam not optimized to evaluate aorta however no aortic dissection identified. Atherosclerotic changes thoracic and upper abdominal aorta with narrowing of the origin of the celiac artery. Atherosclerotic  changes great vessels. Heart size top-normal. Coronary artery calcifications. Calcification mitral and aortic valve. Possible calcification left ventricular apex. Mediastinum/Nodes: Top-normal size subcarinal lymph nodes. Small or moderate-size hiatal hernia. Lungs/Pleura: Scattered areas of scarring without worrisome lung abnormality. Trachea and mainstem bronchi are patent. Upper Abdomen: No worrisome abnormality. Musculoskeletal: No osseous destructive lesion. Post left shoulder replacement. Review of the MIP images confirms the above findings. IMPRESSION: No pulmonary embolus. No aortic dissection. Coronary artery calcifications. Small to moderate-size hiatal hernia. Aortic Atherosclerosis (ICD10-I70.0). Electronically Signed   By: Genia Del M.D.   On: 06/10/2017 12:52   Nm Myocar Multi W/spect W/wall Motion / Ef  Result Date: 06/11/2017  Abnormal, potentially high risk myocardial perfusion stress test.  There is a moderate in size, mild in severity, reversible defect involving the mid inferolateral, apical lateral, and apical segments consistent with ischemia.   There is a small in size, mild in severity, reversible defect involving the mid anterior segment, which may represent subtle ischemia and/or shifting breast attenuation.  The left ventricular ejection fraction is normal (64%).  Transient ischemic dilation is noted (TID 1.31), which is non-specific but can be seen balanced/multivessel ischemia.     Assessment & Plan:   Problem List Items Addressed This Visit    Coronary artery disease    Nonocclusive by recent cardiac cath.  Understands need for statin and asa       Exertional dyspnea    Secondary to age.  Normal EF ,  Nonocclusive disease by diagnostic cardiac cath       Hiatal hernia    Noted on CT .  Advised to eat smaller meals to prevent post prandial dyspnea.       Hospital discharge follow-up    Patient is stable post discharge and has no new issues or questions about discharge plans at the visit today for hospital follow up. All labs , imaging studies and progress notes from admission were reviewed with patient today         Other Visit Diagnoses    Long-term use of high-risk medication    -  Primary   Relevant Orders   Comprehensive metabolic panel (Completed)   Encounter for immunization       Relevant Orders   Flu vaccine HIGH DOSE PF (Completed)     A total of 40 minutes was spent with patient more than half of which was spent in counseling patient on the above mentioned issues , reviewing and explaining recent labs and imaging studies done, and coordination of care.   I am having Kathryn Wise maintain her RESTASIS, Vitamin B 12, aspirin, furosemide, levothyroxine, Vitamin D-3, raloxifene, omeprazole, POTASSIUM PO, ALPRAZolam, lisinopril, atorvastatin, metoprolol tartrate, and nitroGLYCERIN.  No orders of the defined types were placed in this encounter.   There are no discontinued medications.  Follow-up: No Follow-up on file.   [name], MD

## 2017-07-03 NOTE — Assessment & Plan Note (Signed)
Nonocclusive by recent cardiac cath.  Understands need for statin and asa

## 2017-07-09 ENCOUNTER — Ambulatory Visit: Payer: Medicare Other | Admitting: Nurse Practitioner

## 2017-08-01 ENCOUNTER — Other Ambulatory Visit: Payer: Self-pay | Admitting: Internal Medicine

## 2017-08-08 DIAGNOSIS — D1801 Hemangioma of skin and subcutaneous tissue: Secondary | ICD-10-CM | POA: Diagnosis not present

## 2017-08-08 DIAGNOSIS — L578 Other skin changes due to chronic exposure to nonionizing radiation: Secondary | ICD-10-CM | POA: Diagnosis not present

## 2017-08-08 DIAGNOSIS — L820 Inflamed seborrheic keratosis: Secondary | ICD-10-CM | POA: Diagnosis not present

## 2017-08-08 DIAGNOSIS — I8393 Asymptomatic varicose veins of bilateral lower extremities: Secondary | ICD-10-CM | POA: Diagnosis not present

## 2017-08-08 DIAGNOSIS — I781 Nevus, non-neoplastic: Secondary | ICD-10-CM | POA: Diagnosis not present

## 2017-08-08 DIAGNOSIS — Z1283 Encounter for screening for malignant neoplasm of skin: Secondary | ICD-10-CM | POA: Diagnosis not present

## 2017-08-08 DIAGNOSIS — Z85828 Personal history of other malignant neoplasm of skin: Secondary | ICD-10-CM | POA: Diagnosis not present

## 2017-08-08 DIAGNOSIS — L821 Other seborrheic keratosis: Secondary | ICD-10-CM | POA: Diagnosis not present

## 2017-08-16 ENCOUNTER — Other Ambulatory Visit: Payer: Self-pay | Admitting: Internal Medicine

## 2017-09-02 ENCOUNTER — Other Ambulatory Visit: Payer: Self-pay | Admitting: Internal Medicine

## 2017-09-16 ENCOUNTER — Other Ambulatory Visit: Payer: Self-pay | Admitting: Internal Medicine

## 2017-09-17 ENCOUNTER — Ambulatory Visit: Payer: Medicare Other | Admitting: Internal Medicine

## 2017-10-01 ENCOUNTER — Ambulatory Visit (INDEPENDENT_AMBULATORY_CARE_PROVIDER_SITE_OTHER): Payer: Medicare Other | Admitting: Internal Medicine

## 2017-10-01 ENCOUNTER — Encounter: Payer: Self-pay | Admitting: Internal Medicine

## 2017-10-01 VITALS — BP 144/70 | HR 64 | Temp 97.6°F | Resp 14 | Ht 66.0 in | Wt 159.6 lb

## 2017-10-01 DIAGNOSIS — R7303 Prediabetes: Secondary | ICD-10-CM | POA: Diagnosis not present

## 2017-10-01 DIAGNOSIS — J209 Acute bronchitis, unspecified: Secondary | ICD-10-CM

## 2017-10-01 DIAGNOSIS — E785 Hyperlipidemia, unspecified: Secondary | ICD-10-CM

## 2017-10-01 DIAGNOSIS — E039 Hypothyroidism, unspecified: Secondary | ICD-10-CM | POA: Diagnosis not present

## 2017-10-01 DIAGNOSIS — I10 Essential (primary) hypertension: Secondary | ICD-10-CM

## 2017-10-01 LAB — COMPREHENSIVE METABOLIC PANEL
ALBUMIN: 3.8 g/dL (ref 3.5–5.2)
ALK PHOS: 45 U/L (ref 39–117)
ALT: 10 U/L (ref 0–35)
AST: 16 U/L (ref 0–37)
BILIRUBIN TOTAL: 0.4 mg/dL (ref 0.2–1.2)
BUN: 22 mg/dL (ref 6–23)
CO2: 28 mEq/L (ref 19–32)
CREATININE: 1.11 mg/dL (ref 0.40–1.20)
Calcium: 9.2 mg/dL (ref 8.4–10.5)
Chloride: 104 mEq/L (ref 96–112)
GFR: 49.6 mL/min — ABNORMAL LOW (ref >60.00)
GLUCOSE: 79 mg/dL (ref 70–99)
Potassium: 4.2 mEq/L (ref 3.5–5.1)
Sodium: 137 mEq/L (ref 135–145)
TOTAL PROTEIN: 7.1 g/dL (ref 6.0–8.3)

## 2017-10-01 LAB — LIPID PANEL
CHOLESTEROL: 181 mg/dL (ref 0–200)
HDL: 43.8 mg/dL (ref >39.00)
LDL Cholesterol: 115 mg/dL — ABNORMAL HIGH (ref 0–99)
NonHDL: 137.19
Total CHOL/HDL Ratio: 4
Triglycerides: 113 mg/dL (ref 0.0–149.0)
VLDL: 22.6 mg/dL (ref 0.0–40.0)

## 2017-10-01 LAB — HEMOGLOBIN A1C: HEMOGLOBIN A1C: 6.3 % (ref 4.6–6.5)

## 2017-10-01 LAB — TSH: TSH: 2.74 u[IU]/mL (ref 0.35–4.50)

## 2017-10-01 MED ORDER — RALOXIFENE HCL 60 MG PO TABS: 60.0000 mg | ORAL_TABLET | Freq: Every day | ORAL | 1 refills | Status: DC

## 2017-10-01 MED ORDER — PREDNISONE 10 MG PO TABS: ORAL_TABLET | ORAL | 0 refills | Status: DC

## 2017-10-01 MED ORDER — LISINOPRIL 20 MG PO TABS: 20.0000 mg | ORAL_TABLET | Freq: Every day | ORAL | 1 refills | Status: DC

## 2017-10-01 NOTE — Patient Instructions (Addendum)
You have a viral infection complicated by bronchitis.    I am prescribing a 6 day prednisone taper to manage the inflammation  continue Mucinex DM for cough .  Stop the aleve   Take generic OTC benadryl 25 mg every 8 hours for the drainage,   Afrin nasal spray for sinus congestion  Flush your sinuses once daily with Milta Deiters Med's sinus rinse Simply Saline (do over the sink because if you do it right you will spit out globs of mucus)  Gargle with salt water as needed for sore throat.    Continue lisinopril for blood pressure.  Do not resume atorvastatin for cholesterol unless I tell you to

## 2017-10-01 NOTE — Progress Notes (Signed)
Subjective:  Patient ID: Kathryn Wise, female    DOB: 1929-11-14  Age: 84 y.o. MRN: [id number]  CC: The primary encounter diagnosis was Hypothyroidism, unspecified type. Diagnoses of Prediabetes, Hyperlipidemia LDL goal <130, Acute bronchitis, unspecified organism, and Essential hypertension were also pertinent to this visit.  HPI Kathryn Wise presents for follow up on multiple issues including hypertension,  Hyperlipidemia and acute cold symptoms    metoprolol trial was  not tolerated due to lethargy  Hyperlipidemia:  She is no longer taking atorvastatin  Since her refill ran out,  lfts were normal in  Dec . Having leg cramps at night  Cold symptoms x 3 weeks.  No fevers,  Has "settled in her chest"   No phlegm .  Just coughing really hard,   Body aches , says they are  New for her.  Short of breath with climbing stairs. getting worn out easily.  Using aleve.  Using mucinex DM .  Cough is aggravated  By talking , not by lying down.  Staying out of the pool,.  Rhinitis,  Eyes running .      Not taking an allergy pill.   Outpatient Medications Prior to Visit  Medication Sig Dispense Refill  . ALPRAZolam (XANAX) 0.25 MG tablet Take 1 tablet (0.25 mg total) by mouth at bedtime as needed for anxiety. 30 tablet 5  . aspirin 81 MG tablet Take 81 mg by mouth daily.    . Cholecalciferol (VITAMIN D-3) 1000 units CAPS Take 1 capsule by mouth daily.    . Cyanocobalamin (VITAMIN B 12) 100 MCG LOZG Take 1 tablet by mouth daily.     . furosemide (LASIX) 20 MG tablet Take 1 tablet (20 mg total) by mouth daily. As needed for fluid retention 30 tablet 3  . levothyroxine (SYNTHROID, LEVOTHROID) 88 MCG tablet TAKE 1 TABLET BY MOUTH ONCE DAILY ON AN EMPTY STOMACH. WAIT 30 MINUTES BEFORE TAKING OTHER MEDS. 90 tablet 1  . nitroGLYCERIN (NITROSTAT) 0.4 MG SL tablet Place 1 tablet (0.4 mg total) every 5 (five) minutes as needed under the tongue for chest pain. 30 tablet 2  . omeprazole (PRILOSEC) 20 MG  capsule TAKE 1 CAPSULE BY MOUTH ONCE EVERY MORNING BEFORE BREAKFAST 90 capsule 2  . POTASSIUM PO Take 1 capsule by mouth daily.    . RESTASIS 0.05 % ophthalmic emulsion Place 1 drop into both eyes 2 (two) times daily.     [name] lisinopril (PRINIVIL,ZESTRIL) 20 MG tablet TAKE 1 TABLET BY MOUTH ONCE DAILY 90 tablet 1  . raloxifene (EVISTA) 60 MG tablet TAKE 1 TABLET BY MOUTH ONCE DAILY 30 tablet 2  . atorvastatin (LIPITOR) 40 MG tablet TAKE 1 TABLET BY MOUTH AT BEDTIME (Patient not taking: Reported on 10/01/2017) 90 tablet 1  . metoprolol tartrate (LOPRESSOR) 25 MG tablet Take 1 tablet (25 mg total) 2 (two) times daily by mouth. (Patient not taking: Reported on 10/01/2017) 60 tablet 1  . raloxifene (EVISTA) 60 MG tablet TAKE 1 TABLET BY MOUTH ONCE DAILY (Patient not taking: Reported on 10/01/2017) 30 tablet 3   No facility-administered medications prior to visit.     Review of Systems;  Patient denies headache, fevers, malaise, unintentional weight loss, skin rash, eye pain, sinus congestion and sinus pain, sore throat, dysphagia,  hemoptysis , cough, dyspnea, wheezing, chest pain, palpitations, orthopnea, edema, abdominal pain, nausea, melena, diarrhea, constipation, flank pain, dysuria, hematuria, urinary  Frequency, nocturia, numbness, tingling, seizures,  Focal weakness, Loss of consciousness,  Tremor,  insomnia, depression, anxiety, and suicidal ideation.      Objective:  BP (!) 144/70 (BP Location: Right Arm, Patient Position: Sitting, Cuff Size: Normal)   Pulse 64   Temp 97.6 F (36.4 C) (Oral)   Resp 14   Ht 5\' 6"  (1.676 m)   Wt 159 lb 9.6 oz (72.4 kg)   SpO2 96%   BMI 25.76 kg/m   BP Readings from Last 3 Encounters:  10/01/17 (!) 144/70  07/03/17 118/64  06/12/17 124/61    Wt Readings from Last 3 Encounters:  10/01/17 159 lb 9.6 oz (72.4 kg)  07/03/17 158 lb 9.6 oz (71.9 kg)  06/12/17 150 lb 11.2 oz (68.4 kg)    General appearance: alert, cooperative and appears stated  age Ears: normal TM's and external ear canals both ears Throat: lips, mucosa, and tongue normal; teeth and gums normal Neck: no adenopathy, no carotid bruit, supple, symmetrical, trachea midline and thyroid not enlarged, symmetric, no tenderness/mass/nodules Back: symmetric, no curvature. ROM normal. No CVA tenderness. Lungs: clear to auscultation bilaterally Heart: regular rate and rhythm, S1, S2 normal, no murmur, click, rub or gallop Abdomen: soft, non-tender; bowel sounds normal; no masses,  no organomegaly Pulses: 2+ and symmetric Skin: Skin color, texture, turgor normal. No rashes or lesions Lymph nodes: Cervical, supraclavicular, and axillary nodes normal.  Lab Results  Component Value Date   HGBA1C 6.3 10/01/2017   HGBA1C 6.1 12/08/2015    Lab Results  Component Value Date   CREATININE 1.11 10/01/2017   CREATININE 1.01 07/03/2017   CREATININE 0.95 06/11/2017    Lab Results  Component Value Date   WBC 7.8 06/11/2017   HGB 12.3 06/11/2017   HCT 38.0 06/11/2017   PLT 162 06/11/2017   GLUCOSE 79 10/01/2017   CHOL 181 10/01/2017   TRIG 113.0 10/01/2017   HDL 43.80 10/01/2017   LDLDIRECT 126.0 09/19/2016   LDLCALC 115 (H) 10/01/2017   ALT 10 10/01/2017   AST 16 10/01/2017   NA 137 10/01/2017   K 4.2 10/01/2017   CL 104 10/01/2017   CREATININE 1.11 10/01/2017   BUN 22 10/01/2017   CO2 28 10/01/2017   TSH 2.74 10/01/2017   INR 0.98 07/09/2012   HGBA1C 6.3 10/01/2017    Dg Chest 2 View  Result Date: 06/10/2017 CLINICAL DATA:  Heartburn last night.  Chest pain.  Short of breath. EXAM: CHEST  2 VIEW COMPARISON:  05/28/2016 FINDINGS: Normal heart size. Lungs are hyperaerated and clear. No pneumothorax. No pleural effusion. Osteopenia. IMPRESSION: No active cardiopulmonary disease. Electronically Signed   By: Marybelle Killings M.D.   On: 06/10/2017 09:45   Ct Angio Chest Pe W And/or Wo Contrast  Result Date: 06/10/2017 CLINICAL DATA:  84 year old female with exertional  shortness of breath over the past few days. Indigestion. Subsequent encounter. EXAM: CT ANGIOGRAPHY CHEST WITH CONTRAST TECHNIQUE: Multidetector CT imaging of the chest was performed using the standard protocol during bolus administration of intravenous contrast. Multiplanar CT image reconstructions and MIPs were obtained to evaluate the vascular anatomy. CONTRAST:  71mL ISOVUE-370 IOPAMIDOL (ISOVUE-370) INJECTION 76% COMPARISON:  06/10/2017 chest x-ray.  11/26/2012 chest CT. FINDINGS: Cardiovascular:  No pulmonary embolus. Exam not optimized to evaluate aorta however no aortic dissection identified. Atherosclerotic changes thoracic and upper abdominal aorta with narrowing of the origin of the celiac artery. Atherosclerotic changes great vessels. Heart size top-normal. Coronary artery calcifications. Calcification mitral and aortic valve. Possible calcification left ventricular apex. Mediastinum/Nodes: Top-normal size subcarinal lymph nodes. Small or moderate-size hiatal  hernia. Lungs/Pleura: Scattered areas of scarring without worrisome lung abnormality. Trachea and mainstem bronchi are patent. Upper Abdomen: No worrisome abnormality. Musculoskeletal: No osseous destructive lesion. Post left shoulder replacement. Review of the MIP images confirms the above findings. IMPRESSION: No pulmonary embolus. No aortic dissection. Coronary artery calcifications. Small to moderate-size hiatal hernia. Aortic Atherosclerosis (ICD10-I70.0). Electronically Signed   By: Genia Del M.D.   On: 06/10/2017 12:52   Nm Myocar Multi W/spect W/wall Motion / Ef  Result Date: 06/11/2017  Abnormal, potentially high risk myocardial perfusion stress test.  There is a moderate in size, mild in severity, reversible defect involving the mid inferolateral, apical lateral, and apical segments consistent with ischemia.  There is a small in size, mild in severity, reversible defect involving the mid anterior segment, which may represent  subtle ischemia and/or shifting breast attenuation.  The left ventricular ejection fraction is normal (64%).  Transient ischemic dilation is noted (TID 1.31), which is non-specific but can be seen balanced/multivessel ischemia.     Assessment & Plan:   Problem List Items Addressed This Visit    Bronchitis, acute    Viral etiology presumed  Based on history  And exam.  Supportive care outlined       Hyperlipidemia LDL goal <130    She would prefer to remain off of atorvastatin due to increased muscle aches . However, she has coronary artery and aortic atherosclerosis noted on prior CT angiogram  and her risk over 20%.  LDL is 115.   Will recommend resuming atorvastatin and if cramps recur,  Will dc therapy  .    Lab Results  Component Value Date   CHOL 181 10/01/2017   HDL 43.80 10/01/2017   LDLCALC 115 (H) 10/01/2017   LDLDIRECT 126.0 09/19/2016   TRIG 113.0 10/01/2017   CHOLHDL 4 10/01/2017        Relevant Medications   lisinopril (PRINIVIL,ZESTRIL) 20 MG tablet   atorvastatin (LIPITOR) 20 MG tablet   Other Relevant Orders   Lipid panel (Completed)   Hypertension    Well controlled on current regimen of lisinopril only.  Did not tolerate metoprolol . Renal function stable, no changes today.  Lab Results  Component Value Date   CREATININE 1.11 10/01/2017   Lab Results  Component Value Date   NA 137 10/01/2017   K 4.2 10/01/2017   CL 104 10/01/2017   CO2 28 10/01/2017         Relevant Medications   lisinopril (PRINIVIL,ZESTRIL) 20 MG tablet   atorvastatin (LIPITOR) 20 MG tablet   Hypothyroidism - Primary   Relevant Orders   TSH (Completed)    Other Visit Diagnoses    Prediabetes       Relevant Orders   Hemoglobin A1c (Completed)   Comprehensive metabolic panel (Completed)      I have discontinued Gypsy B. Swing's metoprolol tartrate and raloxifene. I have also changed her raloxifene, lisinopril, and atorvastatin. Additionally, I am having her start on  predniSONE. Lastly, I am having her maintain her RESTASIS, Vitamin B 12, aspirin, furosemide, Vitamin D-3, omeprazole, POTASSIUM PO, ALPRAZolam, nitroGLYCERIN, and levothyroxine.  Meds ordered this encounter  Medications  . predniSONE (DELTASONE) 10 MG tablet    Sig: 6 tablets on Day 1 , then reduce by 1 tablet daily until gone    Dispense:  21 tablet    Refill:  0  . raloxifene (EVISTA) 60 MG tablet    Sig: Take 1 tablet (60 mg total) by mouth daily.  Dispense:  90 tablet    Refill:  1  . lisinopril (PRINIVIL,ZESTRIL) 20 MG tablet    Sig: Take 1 tablet (20 mg total) by mouth daily.    Dispense:  90 tablet    Refill:  1  . atorvastatin (LIPITOR) 20 MG tablet    Sig: Take 1 tablet (20 mg total) by mouth at bedtime.    Dispense:  90 tablet    Refill:  1    Medications Discontinued During This Encounter  Medication Reason  . raloxifene (EVISTA) 60 MG tablet Duplicate  . metoprolol tartrate (LOPRESSOR) 25 MG tablet   . atorvastatin (LIPITOR) 40 MG tablet   . raloxifene (EVISTA) 60 MG tablet Reorder  . lisinopril (PRINIVIL,ZESTRIL) 20 MG tablet Reorder    Follow-up: No Follow-up on file.   [name], MD

## 2017-10-03 ENCOUNTER — Telehealth: Payer: Self-pay | Admitting: Internal Medicine

## 2017-10-03 DIAGNOSIS — J209 Acute bronchitis, unspecified: Secondary | ICD-10-CM | POA: Insufficient documentation

## 2017-10-03 DIAGNOSIS — E785 Hyperlipidemia, unspecified: Secondary | ICD-10-CM | POA: Insufficient documentation

## 2017-10-03 MED ORDER — ATORVASTATIN CALCIUM 20 MG PO TABS: 20.0000 mg | ORAL_TABLET | Freq: Every day | ORAL | 1 refills | Status: DC

## 2017-10-03 NOTE — Assessment & Plan Note (Signed)
Viral etiology presumed  Based on history  And exam.  Supportive care outlined

## 2017-10-03 NOTE — Assessment & Plan Note (Addendum)
She would prefer to remain off of atorvastatin due to increased muscle aches . However, she has coronary artery and aortic atherosclerosis noted on prior CT angiogram  and her risk over 20%.  LDL is 115.   Will recommend resuming atorvastatin and if cramps recur,  Will dc therapy  .    Lab Results  Component Value Date   CHOL 181 10/01/2017   HDL 43.80 10/01/2017   LDLCALC 115 (H) 10/01/2017   LDLDIRECT 126.0 09/19/2016   TRIG 113.0 10/01/2017   CHOLHDL 4 10/01/2017

## 2017-10-03 NOTE — Assessment & Plan Note (Signed)
Well controlled on current regimen of lisinopril only.  Did not tolerate metoprolol . Renal function stable, no changes today.  Lab Results  Component Value Date   CREATININE 1.11 10/01/2017   Lab Results  Component Value Date   NA 137 10/01/2017   K 4.2 10/01/2017   CL 104 10/01/2017   CO2 28 10/01/2017

## 2017-10-03 NOTE — Telephone Encounter (Signed)
Have reconsidered your risk for a cardiac event in light of the atherosclerosis noted on your last CT .  If you are willing to resume a lower dose of atorvastatin as a trial,  I would recommend it and have sent the 20 mg dose to your pharmacy

## 2017-10-04 ENCOUNTER — Encounter: Payer: Self-pay | Admitting: *Deleted

## 2017-10-08 MED ORDER — ATORVASTATIN CALCIUM 20 MG PO TABS: 20.0000 mg | ORAL_TABLET | Freq: Every day | ORAL | 1 refills | Status: DC

## 2017-10-08 NOTE — Telephone Encounter (Signed)
Spoke with pt and she stated that she is willing to try the atorvastatin 20mg  again. [facility] stated they did not have th new rx so I resent the rx. Pt stated that she would go by and pick it up today.

## 2017-10-16 DIAGNOSIS — H04123 Dry eye syndrome of bilateral lacrimal glands: Secondary | ICD-10-CM | POA: Diagnosis not present

## 2017-10-21 ENCOUNTER — Other Ambulatory Visit: Payer: Self-pay | Admitting: Internal Medicine

## 2017-10-21 DIAGNOSIS — Z1231 Encounter for screening mammogram for malignant neoplasm of breast: Secondary | ICD-10-CM

## 2017-11-15 ENCOUNTER — Other Ambulatory Visit: Payer: Self-pay | Admitting: Internal Medicine

## 2017-11-21 ENCOUNTER — Ambulatory Visit
Admission: RE | Admit: 2017-11-21 | Discharge: 2017-11-21 | Disposition: A | Discharge status: Home / Self Care | Payer: Medicare Other | Source: Ambulatory Visit | Attending: Internal Medicine | Admitting: Internal Medicine

## 2017-11-21 DIAGNOSIS — Z1231 Encounter for screening mammogram for malignant neoplasm of breast: Secondary | ICD-10-CM | POA: Diagnosis present

## 2017-12-05 ENCOUNTER — Ambulatory Visit: Payer: Self-pay

## 2017-12-05 NOTE — Telephone Encounter (Signed)
No aleve if taking prednisone  But if not,  ok to take

## 2017-12-05 NOTE — Telephone Encounter (Signed)
There is no appointments available for this week but there is some same day appointments next week. Would it be okay to schedule her next week?

## 2017-12-05 NOTE — Telephone Encounter (Signed)
Please advise 

## 2017-12-05 NOTE — Telephone Encounter (Signed)
Tell her to stop the lipitor.  And give her an appointment for next week she can use  Tylenol up to 2000 mg daily for pain until I see her

## 2017-12-05 NOTE — Telephone Encounter (Signed)
Spoke with pt and informed her that Dr. Derrel Nip would like for her to stop taking the lipitor and to take up to 2000mg  of Tylenol daily for the pain. The pt stated that Tylenol does not help her so if it is okay she would rather take Aleve. Please advise.  Appt scheduled for Monday. Pt is aware of appt date and time.

## 2017-12-05 NOTE — Telephone Encounter (Signed)
Patient called in with c/o "all over joint pain." She says "I am on lipitor and it is causing me pain in my legs, hands, wrists and I can't get around like I want to. The pain is so bad when I get up and when I finish my water aerobics. She needs to do something for this pain or switch out my medication. I have reached that point something has to be done." She denies any other symptoms.  According to protocol, see PCP within 3 days, no availability with PCP and patient says "I just want to see Dr. Derrel Nip because she knows me and what is going on. Have her to give me a call or send me something in for the pain." I advised this would be sent to Dr. Derrel Nip for her review and someone will call with her recommendation, patient verbalized understanding.   Reason for Disposition . [1] MODERATE pain (e.g., interferes with normal activities, limping) AND [2] present > 3 days  Answer Assessment - Initial Assessment Questions 1. ONSET: "When did the pain start?"      Months 2. LOCATION: "Where is the pain located?"      Legs, hands, wrists 3. PAIN: "How bad is the pain?"    (Scale 1-10; or mild, moderate, severe)   -  MILD (1-3): doesn't interfere with normal activities    -  MODERATE (4-7): interferes with normal activities (e.g., work or school) or awakens from sleep, limping    -  SEVERE (8-10): excruciating pain, unable to do any normal activities, unable to walk     8 in hands and wrists; up to walk the pain in legs/feet 8 4. WORK OR EXERCISE: "Has there been any recent work or exercise that involved this part of the body?"      No 5. CAUSE: "What do you think is causing the leg pain?"     Lipitor 6. OTHER SYMPTOMS: "Do you have any other symptoms?" (e.g., chest pain, back pain, breathing difficulty, swelling, rash, fever, numbness, weakness)     Hard to get up and down from the pain 7. PREGNANCY: "Is there any chance you are pregnant?" "When was your last menstrual period?"     No  Protocols used:  LEG PAIN-A-AH

## 2017-12-05 NOTE — Telephone Encounter (Signed)
SHE SHOULD NOT BE TAKING ALEVE IF SHE IS TAKING any prednisone .  OTHERWISE OK TO TAKE IT

## 2017-12-05 NOTE — Telephone Encounter (Signed)
Spoke with pt and she stated that she is not currently taking prednisone. So pt was advised that it is okay for her to take the Aleve instead of the tylenol. Pt gave a verbal understanding.

## 2017-12-05 NOTE — Telephone Encounter (Signed)
LMTCB. PEC may speak with pt.  

## 2017-12-09 ENCOUNTER — Ambulatory Visit (INDEPENDENT_AMBULATORY_CARE_PROVIDER_SITE_OTHER): Payer: Medicare Other | Admitting: Internal Medicine

## 2017-12-09 DIAGNOSIS — Z1231 Encounter for screening mammogram for malignant neoplasm of breast: Secondary | ICD-10-CM

## 2017-12-09 DIAGNOSIS — T466X5A Adverse effect of antihyperlipidemic and antiarteriosclerotic drugs, initial encounter: Secondary | ICD-10-CM

## 2017-12-09 DIAGNOSIS — I10 Essential (primary) hypertension: Secondary | ICD-10-CM | POA: Diagnosis not present

## 2017-12-09 DIAGNOSIS — M791 Myalgia, unspecified site: Secondary | ICD-10-CM

## 2017-12-09 NOTE — Progress Notes (Signed)
Subjective:  Patient ID: Kathryn Wise, female    DOB: [birth year]  Age: 84 y.o. MRN: [id number]  CC: Diagnoses of Myalgia due to statin, Essential hypertension, and Breast cancer screening by mammogram were pertinent to this visit.  HPI Kathryn Wise presents for 2 MONTH FOLLOW UP ON  HYPERLIPIDEMIA WITH DEMONSTRATED STATIN INTOLERANCE AND WORSENING HYPERTENSION . patient reports that she developed significant myalgia in all muscles to the point that she needed assistance getiing in and out of car and walking.  Before stopping the atorvastatin  last Thursday  . The pain has improved gradually since then and today is SIGNIFICANTLY BETTER.    Has been taking aleve for the myalgias caused by the statin bc tylenol didn't work,  But her last dose of Aleve was Saturday   Historically exercises for one hour daily in the pool  Has not been able to do exercise since April   bp was previously normal in december  bp last week 962 systolic ,  952/84 today pre meds.  146/72 after taking meds   Has CAD documented.    Outpatient Medications Prior to Visit  Medication Sig Dispense Refill  . ALPRAZolam (XANAX) 0.25 MG tablet Take 1 tablet (0.25 mg total) by mouth at bedtime as needed for anxiety. 30 tablet 5  . aspirin 81 MG tablet Take 81 mg by mouth daily.    . Cholecalciferol (VITAMIN D-3) 1000 units CAPS Take 1 capsule by mouth daily.    . Cyanocobalamin (VITAMIN B 12) 100 MCG LOZG Take 1 tablet by mouth daily.     . furosemide (LASIX) 20 MG tablet TAKE 1 TABLET BY MOUTH ONCE DAILY 30 tablet 5  . levothyroxine (SYNTHROID, LEVOTHROID) 88 MCG tablet TAKE 1 TABLET BY MOUTH ONCE DAILY ON AN EMPTY STOMACH. WAIT 30 MINUTES BEFORE TAKING OTHER MEDS. 90 tablet 1  . lisinopril (PRINIVIL,ZESTRIL) 20 MG tablet Take 1 tablet (20 mg total) by mouth daily. 90 tablet 1  . nitroGLYCERIN (NITROSTAT) 0.4 MG SL tablet Place 1 tablet (0.4 mg total) every 5 (five) minutes as needed under the tongue for chest pain. 30  tablet 2  . omeprazole (PRILOSEC) 20 MG capsule TAKE 1 CAPSULE BY MOUTH ONCE EVERY MORNING BEFORE BREAKFAST 90 capsule 2  . POTASSIUM PO Take 1 capsule by mouth daily.    . raloxifene (EVISTA) 60 MG tablet Take 1 tablet (60 mg total) by mouth daily. 90 tablet 1  . RESTASIS 0.05 % ophthalmic emulsion Place 1 drop into both eyes 2 (two) times daily.     [name] atorvastatin (LIPITOR) 20 MG tablet Take 1 tablet (20 mg total) by mouth at bedtime. (Patient not taking: Reported on 12/09/2017) 90 tablet 1  . predniSONE (DELTASONE) 10 MG tablet 6 tablets on Day 1 , then reduce by 1 tablet daily until gone (Patient not taking: Reported on 12/09/2017) 21 tablet 0   No facility-administered medications prior to visit.     Review of Systems;  Patient denies headache, fevers, malaise, unintentional weight loss, skin rash, eye pain, sinus congestion and sinus pain, sore throat, dysphagia,  hemoptysis , cough, dyspnea, wheezing, chest pain, palpitations, orthopnea, edema, abdominal pain, nausea, melena, diarrhea, constipation, flank pain, dysuria, hematuria, urinary  Frequency, nocturia, numbness, tingling, seizures,  Focal weakness, Loss of consciousness,  Tremor, insomnia, depression, anxiety, and suicidal ideation.      Objective:  BP (!) 168/66 (BP Location: Left Arm, Patient Position: Sitting, Cuff Size: Normal)   Pulse 67   Temp  97.9 F (36.6 C) (Oral)   Resp 15   Ht 5\' 6"  (1.676 m)   Wt 158 lb 6.4 oz (71.8 kg)   SpO2 96%   BMI 25.57 kg/m   BP Readings from Last 3 Encounters:  12/09/17 (!) 168/66  10/01/17 (!) 144/70  07/03/17 118/64    Wt Readings from Last 3 Encounters:  12/09/17 158 lb 6.4 oz (71.8 kg)  10/01/17 159 lb 9.6 oz (72.4 kg)  07/03/17 158 lb 9.6 oz (71.9 kg)    General appearance: alert, cooperative and appears stated age Ears: normal TM's and external ear canals both ears Throat: lips, mucosa, and tongue normal; teeth and gums normal Neck: no adenopathy, no carotid bruit,  supple, symmetrical, trachea midline and thyroid not enlarged, symmetric, no tenderness/mass/nodules Back: symmetric, no curvature. ROM normal. No CVA tenderness. Lungs: clear to auscultation bilaterally Heart: regular rate and rhythm, S1, S2 normal, no murmur, click, rub or gallop Abdomen: soft, non-tender; bowel sounds normal; no masses,  no organomegaly Pulses: 2+ and symmetric Skin: Skin color, texture, turgor normal. No rashes or lesions Lymph nodes: Cervical, supraclavicular, and axillary nodes normal.  Lab Results  Component Value Date   HGBA1C 6.3 10/01/2017   HGBA1C 6.1 12/08/2015    Lab Results  Component Value Date   CREATININE 1.11 10/01/2017   CREATININE 1.01 07/03/2017   CREATININE 0.95 06/11/2017    Lab Results  Component Value Date   WBC 7.8 06/11/2017   HGB 12.3 06/11/2017   HCT 38.0 06/11/2017   PLT 162 06/11/2017   GLUCOSE 79 10/01/2017   CHOL 181 10/01/2017   TRIG 113.0 10/01/2017   HDL 43.80 10/01/2017   LDLDIRECT 126.0 09/19/2016   LDLCALC 115 (H) 10/01/2017   ALT 10 10/01/2017   AST 16 10/01/2017   NA 137 10/01/2017   K 4.2 10/01/2017   CL 104 10/01/2017   CREATININE 1.11 10/01/2017   BUN 22 10/01/2017   CO2 28 10/01/2017   TSH 2.74 10/01/2017   INR 0.98 07/09/2012   HGBA1C 6.3 10/01/2017    Mm Screening Breast Tomo Bilateral  Result Date: 11/22/2017 CLINICAL DATA:  Screening. EXAM: DIGITAL SCREENING BILATERAL MAMMOGRAM WITH TOMO AND CAD COMPARISON:  Previous exam(s). ACR Breast Density Category b: There are scattered areas of fibroglandular density. FINDINGS: There are no findings suspicious for malignancy. Images were processed with CAD. IMPRESSION: No mammographic evidence of malignancy. A result letter of this screening mammogram will be mailed directly to the patient. RECOMMENDATION: Screening mammogram in one year. (Code:SM-B-01Y) BI-RADS CATEGORY  1: Negative. Electronically Signed   By: Fidela Salisbury M.D.   On: 11/22/2017 09:48     Assessment & Plan:   Problem List Items Addressed This Visit    Myalgia due to statin    Severe,  With underlying history of PMR .  Given documented CAD on prior imaging  Will initiate Repatha PA      Hypertension    elevation noted and may be due to use of NSAIDs.  She will add evening dose of lisinopril 20 mg if home repeat measurements are > 140/70 in 72 hours       Breast cancer screening by mammogram    normal mammogram April 2019 ; report reviewed.         A total of 25 minutes of face to face time was spent with patient more than half of which was spent in counselling  Reviewing prior images and reports with pateint ,and coordination of care  I have discontinued Hadlei B. Casciano's predniSONE and atorvastatin. I am also having her maintain her RESTASIS, Vitamin B 12, aspirin, Vitamin D-3, omeprazole, POTASSIUM PO, ALPRAZolam, nitroGLYCERIN, levothyroxine, raloxifene, lisinopril, and furosemide.  No orders of the defined types were placed in this encounter.   Medications Discontinued During This Encounter  Medication Reason  . atorvastatin (LIPITOR) 20 MG tablet Completed Course  . predniSONE (DELTASONE) 10 MG tablet Completed Course    Follow-up: Return in about 6 months (around 06/11/2018).   [name], MD

## 2017-12-09 NOTE — Patient Instructions (Addendum)
I'm glad your pai nis improving sine you stopped the cholesterol medication  However, I need you to Stop taking aleve and motrin because they are raising your blood pressure   Ok to use up to 2000 mg tylenol every day,   Or 4000 mg for short period of time (<  7 days)    If your blood pressure is still > 130/80 in 72 hours,  Increase the lisinopril to 40 mg daily (take 20 mg every 12 hours)   I will start  PA for Repatha:  Given your history of statin intolerance, I would like to suggest a trial of an injectible medication that has become available through pior authorization called "repatha." this medication is self administered as a subCutaneous injection either on a monthly basis or on a biweekly basis. It is a monclonal antibody so it works very differently than statins, and lowers cholesterol very rapidly (by the end of the first week .

## 2017-12-10 DIAGNOSIS — T466X5A Adverse effect of antihyperlipidemic and antiarteriosclerotic drugs, initial encounter: Secondary | ICD-10-CM | POA: Insufficient documentation

## 2017-12-10 DIAGNOSIS — M791 Myalgia, unspecified site: Principal | ICD-10-CM

## 2017-12-10 NOTE — Assessment & Plan Note (Signed)
normal mammogram April 2019 ; report reviewed.

## 2017-12-10 NOTE — Assessment & Plan Note (Signed)
Severe,  With underlying history of PMR .  Given documented CAD on prior imaging  Will initiate Repatha PA

## 2017-12-10 NOTE — Assessment & Plan Note (Signed)
elevation noted and may be due to use of NSAIDs.  She will add evening dose of lisinopril 20 mg if home repeat measurements are > 140/70 in 72 hours

## 2018-01-07 ENCOUNTER — Ambulatory Visit: Payer: Medicare Other

## 2018-01-09 ENCOUNTER — Other Ambulatory Visit: Payer: Self-pay | Admitting: Internal Medicine

## 2018-01-10 NOTE — Telephone Encounter (Signed)
Ok to refill 

## 2018-01-10 NOTE — Telephone Encounter (Signed)
Refilled: 03/27/2017 Last OV: 12/09/2017 Next OV: 04/07/2018

## 2018-01-13 NOTE — Telephone Encounter (Signed)
Printed, signed and faxed.  

## 2018-01-15 ENCOUNTER — Ambulatory Visit (INDEPENDENT_AMBULATORY_CARE_PROVIDER_SITE_OTHER): Payer: Medicare Other

## 2018-01-15 VITALS — BP 124/62 | HR 70 | Temp 98.5°F | Resp 15 | Ht 66.0 in | Wt 158.8 lb

## 2018-01-15 DIAGNOSIS — Z0000 Encounter for general adult medical examination without abnormal findings: Secondary | ICD-10-CM | POA: Diagnosis not present

## 2018-01-15 NOTE — Patient Instructions (Addendum)
  Ms. Daquila , Thank you for taking time to come for your Medicare Wellness Visit. I appreciate your ongoing commitment to your health goals. Please review the following plan we discussed and let me know if I can assist you in the future.   Follow up as needed.    Bring a copy of your [place] and/or Living Will to be scanned into chart.  Have a great day!  These are the goals we discussed: Goals    . Maintain Healthy Lifestyle     Healthy diet Stay hydrated Exercise Keep scheduled visits with pcp        This is a list of the screening recommended for you and due dates:  Health Maintenance  Topic Date Due  . Flu Shot  02/27/2018  . Tetanus Vaccine  10/08/2023  . DEXA scan (bone density measurement)  Completed  . Pneumonia vaccines  Completed

## 2018-01-15 NOTE — Progress Notes (Signed)
Subjective:   Kathryn Wise is a 84 y.o. female who presents for Medicare Annual (Subsequent) preventive examination.  Review of Systems:  No ROS.  Medicare Wellness Visit. Additional risk factors are reflected in the social history.  Cardiac Risk Factors include: hypertension;advanced age (>50men, >23 women)     Objective:     Vitals: BP 124/62 (BP Location: Left Arm, Patient Position: Sitting, Cuff Size: Normal)   Pulse 70   Temp 98.5 F (36.9 C) (Oral)   Resp 15   Ht 5\' 6"  (1.676 m)   Wt 158 lb 12.8 oz (72 kg)   SpO2 95%   BMI 25.63 kg/m   Body mass index is 25.63 kg/m.  Advanced Directives 01/15/2018 06/12/2017 06/10/2017 11/01/2016 07/14/2016 05/28/2016 03/28/2016  Does Patient Have a Medical Advance Directive? Yes Yes Yes Yes Yes No Yes  Type of Paramedic of [place];Living will [place];Living will Living will;Healthcare Power of [place];Living will [place];Living will - [place];Living will  Does patient want to make changes to medical advance directive? No - Patient declined No - Patient declined No - Patient declined No - Patient declined - - No - Patient declined  Copy of [place] in Chart? No - copy requested No - copy requested No - copy requested No - copy requested - - No - copy requested  Would patient like information on creating a medical advance directive? - - - - - Yes - Educational materials given -  Pre-existing out of facility DNR order (yellow form or pink MOST form) - - - - - - -    Tobacco Social History   Tobacco Use  Smoking Status Former Smoker  . Types: Cigarettes  . Last attempt to quit: 07/30/1972  . Years since quitting: 45.4  Smokeless Tobacco Never Used  Tobacco Comment   smoked for only a few months     Counseling given: Not Answered Comment: smoked for only a few months   Clinical  Intake:  Pre-visit preparation completed: Yes  Pain : No/denies pain     Nutritional Status: BMI 25 -29 Overweight Diabetes: No  How often do you need to have someone help you when you read instructions, pamphlets, or other written materials from your doctor or pharmacy?: 1 - Never  Interpreter Needed?: No     Past Medical History:  Diagnosis Date  . Arthritis   . GERD (gastroesophageal reflux disease)   . Heart murmur    has had years and years  . History of shingles Dec 2013   treated with steroids , post op from shoulder surgery  . Hypertension   . Hypothyroidism   . Osteoarthritis of left shoulder 07/14/2012  . Polymyalgia rheumatica (HCC)    Past Surgical History:  Procedure Laterality Date  . ARTHOSCOPIC ROTAOR CUFF REPAIR     LEFT  10+  YEARS    . CATARACT EXTRACTION W/ INTRAOCULAR LENS IMPLANT     RIGHT EYE  . CATARACT EXTRACTION W/PHACO Left 03/28/2016   Procedure: CATARACT EXTRACTION PHACO AND INTRAOCULAR LENS PLACEMENT (IOC);  Surgeon: Leandrew Koyanagi, MD;  Location: [facility];  Service: Ophthalmology;  Laterality: Left;  TORIC  . FACELIFT    . FOOT SURGERY     rt foot   TUMOR REMOVED  . JOINT REPLACEMENT Left Dec 2013   shoulder  . LEFT HEART CATH AND CORONARY ANGIOGRAPHY N/A 06/12/2017   Procedure:  LEFT HEART CATH AND CORONARY ANGIOGRAPHY;  Surgeon: Minna Merritts, MD;  Location: [facility];  Service: Cardiovascular;  Laterality: N/A;  . SKIN CANCER EXCISION    . TOTAL SHOULDER ARTHROPLASTY  07/14/2012   Procedure: TOTAL SHOULDER ARTHROPLASTY;  Surgeon: Johnny Bridge, MD;  Location: [facility];  Service: Orthopedics;  Laterality: Left;   Family History  Problem Relation Age of Onset  . Cancer Daughter        Breast  . Breast cancer Daughter 53  . Cancer Son    Social History   Socioeconomic History  . Marital status: Widowed    Spouse name: Not on file  . Number of children: Not on file  . Years of education: Not on  file  . Highest education level: Not on file  Occupational History  . Not on file  Social Needs  . Financial resource strain: Not hard at all  . Food insecurity:    Worry: Never true    Inability: Never true  . Transportation needs:    Medical: No    Non-medical: No  Tobacco Use  . Smoking status: Former Smoker    Types: Cigarettes    Last attempt to quit: 07/30/1972    Years since quitting: 45.4  . Smokeless tobacco: Never Used  . Tobacco comment: smoked for only a few months  Substance and Sexual Activity  . Alcohol use: Yes    Comment: OCCAS WINE   . Drug use: No  . Sexual activity: Never  Lifestyle  . Physical activity:    Days per week: 4 days    Minutes per session: Not on file  . Stress: Not at all  Relationships  . Social connections:    Talks on phone: Not on file    Gets together: Not on file    Attends religious service: Not on file    Active member of club or organization: Not on file    Attends meetings of clubs or organizations: Not on file    Relationship status: Not on file  Other Topics Concern  . Not on file  Social History Narrative  . Not on file    Outpatient Encounter Medications as of 01/15/2018  Medication Sig  . ALPRAZolam (XANAX) 0.25 MG tablet TAKE 1 TABLET BY MOUTH AT BEDTIME AS NEEDED FOR ANXIETY  . aspirin 81 MG tablet Take 81 mg by mouth daily.  . Cholecalciferol (VITAMIN D-3) 1000 units CAPS Take 1 capsule by mouth daily.  . Cyanocobalamin (VITAMIN B 12) 100 MCG LOZG Take 1 tablet by mouth daily.   . furosemide (LASIX) 20 MG tablet TAKE 1 TABLET BY MOUTH ONCE DAILY  . levothyroxine (SYNTHROID, LEVOTHROID) 88 MCG tablet TAKE 1 TABLET BY MOUTH ONCE DAILY ON AN EMPTY STOMACH. WAIT 30 MINUTES BEFORE TAKING OTHER MEDS.  [name] lisinopril (PRINIVIL,ZESTRIL) 20 MG tablet Take 1 tablet (20 mg total) by mouth daily.  . nitroGLYCERIN (NITROSTAT) 0.4 MG SL tablet Place 1 tablet (0.4 mg total) every 5 (five) minutes as needed under the tongue for chest  pain.  [name] omeprazole (PRILOSEC) 20 MG capsule TAKE 1 CAPSULE BY MOUTH ONCE EVERY MORNING BEFORE BREAKFAST  . POTASSIUM PO Take 1 capsule by mouth daily.  . raloxifene (EVISTA) 60 MG tablet Take 1 tablet (60 mg total) by mouth daily.  . RESTASIS 0.05 % ophthalmic emulsion Place 1 drop into both eyes 2 (two) times daily.    No facility-administered encounter medications on file as of 01/15/2018.  Activities of Daily Living In your present state of health, do you have any difficulty performing the following activities: 01/15/2018 06/10/2017  Hearing? N -  Vision? N -  Difficulty concentrating or making decisions? N -  Walking or climbing stairs? Y -  Comment Chronic leg pain -  Dressing or bathing? N -  Doing errands, shopping? N N  Preparing Food and eating ? N -  Using the Toilet? N -  In the past six months, have you accidently leaked urine? N -  Do you have problems with loss of bowel control? N -  Managing your Medications? N -  Managing your Finances? N -  Housekeeping or managing your Housekeeping? N -  Some recent data might be hidden    Patient Care Team: [name], MD as PCP - General (Internal Medicine)    Assessment:   This is a routine wellness examination for [place].  The goal of the wellness visit is to assist the patient how to close the gaps in care and create a preventative care plan for the patient.   The roster of all physicians providing medical care to patient is listed in the Snapshot section of the chart.  Taking calcium VIT D as appropriate/Osteoporosis reviewed.    Safety issues reviewed; Smoke and carbon monoxide detectors in the home. No firearms in the home. Wears seatbelts when driving or riding with others. No violence in the home.  They do not have excessive sun exposure.  Discussed the need for sun protection: hats, long sleeves and the use of sunscreen if there is significant sun exposure.  Patient is alert, normal appearance,  oriented to person/place/and time. Correctly identified the president of the [place] and recalls of 3/3 words. Performs simple calculations and can read correct time from watch face. Displays appropriate judgement.  No new identified risk were noted.  No failures at ADL's or IADL's.    BMI- discussed the importance of a healthy diet, water intake and the benefits of aerobic exercise. Educational material provided.   24 hour diet recall: Low carb diet  Dental- every 6 months.  Eye- Visual acuity not assessed per patient preference since they have regular follow up with the ophthalmologist.  Wears corrective lenses.   Sleep patterns-  Sleeps without issues; taking medication as prescribed.   Health maintenance gaps- closed.  Patient Concerns:States she is doing much better since she is no longer taking Lipitor.  Overall, she feels good and plans to maintain a healthy lifestyle.   Exercise Activities and Dietary recommendations Current Exercise Habits: Home exercise routine, Time (Minutes): 60, Frequency (Times/Week): 4, Weekly Exercise (Minutes/Week): 240, Intensity: Moderate  Goals    . Maintain Healthy Lifestyle     Healthy diet Stay hydrated Exercise Keep scheduled visits with pcp        Fall Risk Fall Risk  01/15/2018 12/09/2017 11/01/2016 03/30/2016 10/08/2014  Falls in the past year? Yes Yes No No No  Comment - - - Emmi Telephone Survey: data to providers prior to load -  Number falls in past yr: 1 1 - - -  Injury with Fall? (No Data) Yes - - -  Comment Followed by pcp - - - -  Follow up Falls prevention discussed - - - -   Depression Screen PHQ 2/9 Scores 01/15/2018 11/01/2016 10/08/2014 09/10/2013  PHQ - 2 Score 0 0 0 0     Cognitive Function MMSE - Mini Mental State Exam 01/15/2018  Orientation to time 5  Orientation to Place 5  Registration 3  Attention/ Calculation 5  Recall 3  Language- name 2 objects 2  Language- repeat 1  Language- follow 3 step command 3   Language- read & follow direction 1  Write a sentence 1  Copy design 1  Total score 30     6CIT Screen 11/01/2016  What Year? 0 points  What month? 0 points  What time? 0 points  Count back from 20 0 points  Months in reverse 0 points  Repeat phrase 0 points  Total Score 0    Immunization History  Administered Date(s) Administered  . Influenza Split 07/17/2012, 05/22/2013  . Influenza, High Dose Seasonal PF 04/28/2015, 05/30/2016, 07/03/2017  . Pneumococcal Conjugate-13 09/10/2013  . Pneumococcal Polysaccharide-23 09/10/2008, 10/08/2014  . Tdap 10/07/2013   Screening Tests Health Maintenance  Topic Date Due  . INFLUENZA VACCINE  02/27/2018  . TETANUS/TDAP  10/08/2023  . DEXA SCAN  Completed  . PNA vac Low Risk Adult  Completed      Plan:    End of life planning; Advance aging; Advanced directives discussed. Copy of current HCPOA/Living Will requested.    I have personally reviewed and noted the following in the patient's chart:   . Medical and social history . Use of alcohol, tobacco or illicit drugs  . Current medications and supplements . Functional ability and status . Nutritional status . Physical activity . Advanced directives . List of other physicians . Hospitalizations, surgeries, and ER visits in previous 12 months . Vitals . Screenings to include cognitive, depression, and falls . Referrals and appointments  In addition, I have reviewed and discussed with patient certain preventive protocols, quality metrics, and best practice recommendations. A written personalized care plan for preventive services as well as general preventive health recommendations were provided to patient.     Varney Biles, LPN  [id number]

## 2018-02-03 ENCOUNTER — Other Ambulatory Visit: Payer: Self-pay | Admitting: Internal Medicine

## 2018-04-07 ENCOUNTER — Ambulatory Visit (INDEPENDENT_AMBULATORY_CARE_PROVIDER_SITE_OTHER): Payer: Medicare Other | Admitting: Internal Medicine

## 2018-04-07 ENCOUNTER — Encounter: Payer: Self-pay | Admitting: Internal Medicine

## 2018-04-07 VITALS — BP 140/68 | HR 93 | Temp 98.0°F | Resp 15 | Ht 66.0 in | Wt 159.8 lb

## 2018-04-07 DIAGNOSIS — R011 Cardiac murmur, unspecified: Secondary | ICD-10-CM | POA: Diagnosis not present

## 2018-04-07 DIAGNOSIS — K2100 Gastro-esophageal reflux disease with esophagitis, without bleeding: Secondary | ICD-10-CM

## 2018-04-07 DIAGNOSIS — E538 Deficiency of other specified B group vitamins: Secondary | ICD-10-CM | POA: Diagnosis not present

## 2018-04-07 DIAGNOSIS — E039 Hypothyroidism, unspecified: Secondary | ICD-10-CM | POA: Diagnosis not present

## 2018-04-07 DIAGNOSIS — Z23 Encounter for immunization: Secondary | ICD-10-CM

## 2018-04-07 DIAGNOSIS — T466X5A Adverse effect of antihyperlipidemic and antiarteriosclerotic drugs, initial encounter: Secondary | ICD-10-CM | POA: Diagnosis not present

## 2018-04-07 DIAGNOSIS — M353 Polymyalgia rheumatica: Secondary | ICD-10-CM | POA: Diagnosis not present

## 2018-04-07 DIAGNOSIS — I10 Essential (primary) hypertension: Secondary | ICD-10-CM

## 2018-04-07 DIAGNOSIS — K449 Diaphragmatic hernia without obstruction or gangrene: Secondary | ICD-10-CM | POA: Diagnosis not present

## 2018-04-07 DIAGNOSIS — M85869 Other specified disorders of bone density and structure, unspecified lower leg: Secondary | ICD-10-CM | POA: Diagnosis not present

## 2018-04-07 DIAGNOSIS — E559 Vitamin D deficiency, unspecified: Secondary | ICD-10-CM | POA: Diagnosis not present

## 2018-04-07 DIAGNOSIS — M791 Myalgia, unspecified site: Secondary | ICD-10-CM

## 2018-04-07 LAB — COMPREHENSIVE METABOLIC PANEL
ALT: 12 U/L (ref 0–35)
AST: 17 U/L (ref 0–37)
Albumin: 3.7 g/dL (ref 3.5–5.2)
Alkaline Phosphatase: 46 U/L (ref 39–117)
BUN: 16 mg/dL (ref 6–23)
CHLORIDE: 103 meq/L (ref 96–112)
CO2: 26 meq/L (ref 19–32)
CREATININE: 0.99 mg/dL (ref 0.40–1.20)
Calcium: 9.3 mg/dL (ref 8.4–10.5)
GFR: 56.53 mL/min — ABNORMAL LOW (ref >60.00)
Glucose, Bld: 145 mg/dL — ABNORMAL HIGH (ref 70–99)
POTASSIUM: 4.6 meq/L (ref 3.5–5.1)
SODIUM: 134 meq/L — AB (ref 135–145)
Total Bilirubin: 0.5 mg/dL (ref 0.2–1.2)
Total Protein: 6.8 g/dL (ref 6.0–8.3)

## 2018-04-07 LAB — VITAMIN D 25 HYDROXY (VIT D DEFICIENCY, FRACTURES): VITD: 41.16 ng/mL (ref 30.00–100.00)

## 2018-04-07 LAB — VITAMIN B12: Vitamin B-12: >1500 pg/mL — ABNORMAL HIGH (ref 211–911)

## 2018-04-07 MED ORDER — LISINOPRIL 20 MG PO TABS: 20.0000 mg | ORAL_TABLET | Freq: Every day | ORAL | 1 refills | Status: DC

## 2018-04-07 MED ORDER — OMEPRAZOLE 20 MG PO CPDR: DELAYED_RELEASE_CAPSULE | ORAL | 2 refills | Status: DC

## 2018-04-07 NOTE — Assessment & Plan Note (Signed)
Discussed current controversy regarding prolonged use of PPI in patients without documented Barretts esophagus.  Patient has no prior EGD but has been on PPI therapy  With good results NO CHANGES TODAY SINCE PRIOR ATTEMPTS TO WEAN CAUSED A RETURN OF SYMPOTMS  CHECKING b12 AND VITAMIN d TODAY

## 2018-04-07 NOTE — Progress Notes (Signed)
Subjective:  Patient ID: Kathryn Wise, female    DOB: [birth year]  Age: 84 y.o. MRN: [id number]  CC: The primary encounter diagnosis was Vitamin D deficiency. Diagnoses of B12 deficiency, Essential hypertension, Acquired hypothyroidism, Heart murmur, Myalgia due to statin, Osteopenia of lower leg, unspecified laterality, Hiatal hernia, Need for influenza vaccination, Polymyalgia rheumatica ([place]), and Gastroesophageal reflux disease with esophagitis were also pertinent to this visit.  HPI Kathryn Wise presents for follow up on multiple issues:  hypertension managed with lisinopril. Last seen in March. Home readings usually 130/78 or less,  Lower after exercise (121/60) . Tolerating med without cough, orthostasis. .    Joint pain:  She thinks the Evista , which she ran out of 4 days ago,  May be responsible for herincreased  joint pain since her severe joint pain improved when she stopped taking it .  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  Attempts to wean cause return of symptoms   Outpatient Medications Prior to Visit  Medication Sig Dispense Refill  . ALPRAZolam (XANAX) 0.25 MG tablet TAKE 1 TABLET BY MOUTH AT BEDTIME AS NEEDED FOR ANXIETY 30 tablet 3  . aspirin 81 MG tablet Take 81 mg by mouth daily.    . Cholecalciferol (VITAMIN D-3) 1000 units CAPS Take 1 capsule by mouth daily.    . Cyanocobalamin (VITAMIN B 12) 100 MCG LOZG Take 1 tablet by mouth daily.     . furosemide (LASIX) 20 MG tablet TAKE 1 TABLET BY MOUTH ONCE DAILY 30 tablet 5  . levothyroxine (SYNTHROID, LEVOTHROID) 88 MCG tablet TAKE 1 TABLET BY MOUTH ONCE DAILY ON EMPTY STOMACH. WAIT 30 MIN FOR OTHER MEDS 90 tablet 0  . nitroGLYCERIN (NITROSTAT) 0.4 MG SL tablet Place 1 tablet (0.4 mg total)  every 5 (five) minutes as needed under the tongue for chest pain. 30 tablet 2  . POTASSIUM PO Take 1 capsule by mouth daily.    . RESTASIS 0.05 % ophthalmic emulsion Place 1 drop into both eyes 2 (two) times daily.     [name] lisinopril (PRINIVIL,ZESTRIL) 20 MG tablet Take 1 tablet (20 mg total) by mouth daily. 90 tablet 1  . omeprazole (PRILOSEC) 20 MG capsule TAKE 1 CAPSULE BY MOUTH ONCE EVERY MORNING BEFORE BREAKFAST 90 capsule 2  . raloxifene (EVISTA) 60 MG tablet Take 1 tablet (60 mg total) by mouth daily. 90 tablet 1   No facility-administered medications prior to visit.     Review of Systems;  Patient denies headache, fevers, malaise, unintentional weight loss, skin rash, eye pain, sinus congestion and sinus pain, sore throat, dysphagia,  hemoptysis , cough, dyspnea, wheezing, chest pain, palpitations, orthopnea, edema, abdominal pain, nausea, melena, diarrhea, constipation, flank pain, dysuria, hematuria, urinary  Frequency, nocturia, numbness, tingling, seizures,  Focal weakness, Loss of consciousness,  Tremor, insomnia, depression, anxiety, and suicidal ideation.      Objective:  BP 140/68 (BP Location: Left Arm, Patient Position: Sitting, Cuff Size: Normal)   Pulse 93   Temp 98 F (36.7 C) (Oral)   Resp 15   Ht 5\' 6"  (1.676 m)   Wt 159 lb 12.8 oz (72.5 kg)   SpO2 96%   BMI 25.79 kg/m   BP Readings from Last 3 Encounters:  04/07/18 140/68  01/15/18  124/62  12/09/17 (!) 168/66    Wt Readings from Last 3 Encounters:  04/07/18 159 lb 12.8 oz (72.5 kg)  01/15/18 158 lb 12.8 oz (72 kg)  12/09/17 158 lb 6.4 oz (71.8 kg)    General appearance: alert, cooperative and appears stated age Neck: no adenopathy, no carotid bruit, supple, symmetrical, trachea midline and thyroid not enlarged, symmetric, no tenderness/mass/nodules Back: symmetric, no curvature. ROM normal. No CVA tenderness. Lungs: clear to auscultation bilaterally Heart: regular rate and rhythm, S1, S2 normal, Grade   3 SYSTOLIC MURMUR non radiating. no click, rub or gallop Abdomen: soft, non-tender; bowel sounds normal; no masses,  no organomegaly Pulses: 2+ and symmetric Skin: Skin color, texture, turgor normal. No rashes or lesions Lymph nodes: Cervical, supraclavicular, and axillary nodes normal.  Lab Results  Component Value Date   HGBA1C 6.3 10/01/2017   HGBA1C 6.1 12/08/2015    Lab Results  Component Value Date   CREATININE 0.99 04/07/2018   CREATININE 1.11 10/01/2017   CREATININE 1.01 07/03/2017    Lab Results  Component Value Date   WBC 7.8 06/11/2017   HGB 12.3 06/11/2017   HCT 38.0 06/11/2017   PLT 162 06/11/2017   GLUCOSE 145 (H) 04/07/2018   CHOL 181 10/01/2017   TRIG 113.0 10/01/2017   HDL 43.80 10/01/2017   LDLDIRECT 126.0 09/19/2016   LDLCALC 115 (H) 10/01/2017   ALT 12 04/07/2018   AST 17 04/07/2018   NA 134 (L) 04/07/2018   K 4.6 04/07/2018   CL 103 04/07/2018   CREATININE 0.99 04/07/2018   BUN 16 04/07/2018   CO2 26 04/07/2018   TSH 2.74 10/01/2017   INR 0.98 07/09/2012   HGBA1C 6.3 10/01/2017    Mm Screening Breast Tomo Bilateral  Result Date: 11/22/2017 CLINICAL DATA:  Screening. EXAM: DIGITAL SCREENING BILATERAL MAMMOGRAM WITH TOMO AND CAD COMPARISON:  Previous exam(s). ACR Breast Density Category b: There are scattered areas of fibroglandular density. FINDINGS: There are no findings suspicious for malignancy. Images were processed with CAD. IMPRESSION: No mammographic evidence of malignancy. A result letter of this screening mammogram will be mailed directly to the patient. RECOMMENDATION: Screening mammogram in one year. (Code:SM-B-01Y) BI-RADS CATEGORY  1: Negative. Electronically Signed   By: Fidela Salisbury M.D.   On: 11/22/2017 09:48    Assessment & Plan:   Problem List Items Addressed This Visit    GERD (gastroesophageal reflux disease)    The risks of long term PPI use for acid suppression in patients without documented Barretts esophagus were  discussed, including the possibility of osteoporosis, iron , B12 and magnesium deficiencies,  CKD, and dementia. She has been unable to suspend PPI due to return of symptoms        Relevant Medications   omeprazole (PRILOSEC) 20 MG capsule   Heart murmur    Asymptomatic.  Present for "decades."  She declines further workup, which I agree is unnecessary until she becomes asymptomatic       Hiatal hernia    Discussed current controversy regarding prolonged use of PPI in patients without documented Barretts esophagus.  Patient has no prior EGD but has been on PPI therapy  With good results NO CHANGES TODAY SINCE PRIOR ATTEMPTS TO WEAN CAUSED A RETURN OF SYMPOTMS  CHECKING b12 AND VITAMIN d TODAY       Hypertension    Well controlled on current regimen. Renal function due, no changes today.      Relevant Medications   lisinopril (PRINIVIL,ZESTRIL) 20 MG tablet  Other Relevant Orders   Comprehensive metabolic panel (Completed)   Hypothyroidism   Myalgia due to statin    Arthralgias has also improved with suspension of evista       Osteopenia    With left olecranon fracture in 2018.  Repeat DXA in 2020 after one year discontinuation of Evista due to arthralgia.s checking vitamin D       Polymyalgia rheumatica ([facility])    Her arthralgias have resolved with cessation of Evista .  continue aleve prn joint pain        Other Visit Diagnoses    Vitamin D deficiency    -  Primary   Relevant Orders   VITAMIN D 25 Hydroxy (Vit-D Deficiency, Fractures) (Completed)   B12 deficiency       Relevant Orders   Vitamin B12 (Completed)   Need for influenza vaccination       Relevant Orders   Flu vaccine HIGH DOSE PF (Fluzone High dose) (Completed)    A total of 25 minutes of face to face time was spent with patient more than half of which was spent in counselling about the above mentioned conditions  and coordination of care   I have discontinued Lindi B. Losasso's raloxifene. I am also having  her maintain her RESTASIS, Vitamin B 12, aspirin, Vitamin D-3, POTASSIUM PO, nitroGLYCERIN, furosemide, ALPRAZolam, levothyroxine, lisinopril, and omeprazole.  Meds ordered this encounter  Medications  . lisinopril (PRINIVIL,ZESTRIL) 20 MG tablet    Sig: Take 1 tablet (20 mg total) by mouth daily.    Dispense:  90 tablet    Refill:  1  . omeprazole (PRILOSEC) 20 MG capsule    Sig: TAKE 1 CAPSULE BY MOUTH ONCE EVERY MORNING BEFORE BREAKFAST    Dispense:  90 capsule    Refill:  2    Medications Discontinued During This Encounter  Medication Reason  . raloxifene (EVISTA) 60 MG tablet   . lisinopril (PRINIVIL,ZESTRIL) 20 MG tablet Reorder  . omeprazole (PRILOSEC) 20 MG capsule Reorder    Follow-up: No follow-ups on file.   [name], MD

## 2018-04-07 NOTE — Assessment & Plan Note (Signed)
With left olecranon fracture in 2018.  Repeat DXA in 2020 after one year discontinuation of Evista due to arthralgia.s checking vitamin D

## 2018-04-07 NOTE — Patient Instructions (Addendum)
I agree with stopping Evista .    If your joint pain continues to improve,  We will  discuss an alternative to Evista  Next year  AFTER WE REPEAT Your DEXA scan   I have refilled your omeprazole and your lisinopril

## 2018-04-07 NOTE — Assessment & Plan Note (Signed)
Well controlled on current regimen. Renal function due.,   no changes today. 

## 2018-04-07 NOTE — Assessment & Plan Note (Signed)
Asymptomatic.  Present for "decades."  She declines further workup, which I agree is unnecessary until she becomes asymptomatic

## 2018-04-07 NOTE — Assessment & Plan Note (Signed)
Arthralgias has also improved with suspension of evista

## 2018-04-08 NOTE — Assessment & Plan Note (Signed)
Her arthralgias have resolved with cessation of Evista .  continue aleve prn joint pain

## 2018-04-08 NOTE — Assessment & Plan Note (Signed)
The risks of long term PPI use for acid suppression in patients without documented Barretts esophagus were discussed, including the possibility of osteoporosis, iron , B12 and magnesium deficiencies,  CKD, and dementia. She has been unable to suspend PPI due to return of symptoms

## 2018-04-10 ENCOUNTER — Encounter: Payer: Self-pay | Admitting: *Deleted

## 2018-05-02 ENCOUNTER — Other Ambulatory Visit: Payer: Self-pay | Admitting: Internal Medicine

## 2018-07-28 ENCOUNTER — Other Ambulatory Visit: Payer: Self-pay | Admitting: Internal Medicine

## 2018-07-29 NOTE — Telephone Encounter (Signed)
Levothyroxine   Refilled: 05/02/2018   Last TSH: 10/01/2017  Alprazolam   Refilled: 01/10/2018  Last OV: 04/07/2018 Next OV: 10/06/2018

## 2018-08-13 DIAGNOSIS — D1800 Hemangioma unspecified site: Secondary | ICD-10-CM | POA: Diagnosis not present

## 2018-08-13 DIAGNOSIS — L578 Other skin changes due to chronic exposure to nonionizing radiation: Secondary | ICD-10-CM | POA: Diagnosis not present

## 2018-08-13 DIAGNOSIS — Z85828 Personal history of other malignant neoplasm of skin: Secondary | ICD-10-CM | POA: Diagnosis not present

## 2018-08-13 DIAGNOSIS — L821 Other seborrheic keratosis: Secondary | ICD-10-CM | POA: Diagnosis not present

## 2018-08-13 DIAGNOSIS — L853 Xerosis cutis: Secondary | ICD-10-CM | POA: Diagnosis not present

## 2018-08-13 DIAGNOSIS — L603 Nail dystrophy: Secondary | ICD-10-CM | POA: Diagnosis not present

## 2018-08-13 DIAGNOSIS — Z1283 Encounter for screening for malignant neoplasm of skin: Secondary | ICD-10-CM | POA: Diagnosis not present

## 2018-09-01 ENCOUNTER — Ambulatory Visit (INDEPENDENT_AMBULATORY_CARE_PROVIDER_SITE_OTHER): Payer: Medicare Other | Admitting: Family Medicine

## 2018-09-01 ENCOUNTER — Encounter: Payer: Self-pay | Admitting: Family Medicine

## 2018-09-01 VITALS — BP 160/80 | HR 75 | Temp 97.8°F | Resp 16 | Ht 66.0 in | Wt 159.2 lb

## 2018-09-01 DIAGNOSIS — G4700 Insomnia, unspecified: Secondary | ICD-10-CM

## 2018-09-01 LAB — COMPREHENSIVE METABOLIC PANEL
ALBUMIN: 4 g/dL (ref 3.5–5.2)
ALK PHOS: 45 U/L (ref 39–117)
ALT: 13 U/L (ref 0–35)
AST: 18 U/L (ref 0–37)
BILIRUBIN TOTAL: 0.5 mg/dL (ref 0.2–1.2)
BUN: 13 mg/dL (ref 6–23)
CALCIUM: 9.9 mg/dL (ref 8.4–10.5)
CO2: 29 mEq/L (ref 19–32)
CREATININE: 0.94 mg/dL (ref 0.40–1.20)
Chloride: 103 mEq/L (ref 96–112)
GFR: 56.41 mL/min — AB (ref >60.00)
GLUCOSE: 94 mg/dL (ref 70–99)
POTASSIUM: 4.9 meq/L (ref 3.5–5.1)
SODIUM: 136 meq/L (ref 135–145)
Total Protein: 7.1 g/dL (ref 6.0–8.3)

## 2018-09-01 LAB — CBC
HEMATOCRIT: 35.3 % — AB (ref 36.0–46.0)
HEMOGLOBIN: 11.3 g/dL — AB (ref 12.0–15.0)
MCHC: 32.1 g/dL (ref 30.0–36.0)
MCV: 82.2 fl (ref 78.0–100.0)
Platelets: 196 10*3/uL (ref 150.0–400.0)
RBC: 4.29 Mil/uL (ref 3.87–5.11)
RDW: 17.9 % — ABNORMAL HIGH (ref 11.5–15.5)
WBC: 7.8 10*3/uL (ref 4.0–10.5)

## 2018-09-01 MED ORDER — ZOLPIDEM TARTRATE 5 MG PO TABS: 5.0000 mg | ORAL_TABLET | Freq: Every evening | ORAL | 0 refills | Status: DC | PRN

## 2018-09-01 NOTE — Progress Notes (Signed)
Subjective:    Patient ID: Kathryn Wise, female    DOB: Oct 21, 1929, 85 y.o.   MRN: [id number]  HPI   Patient presents to clinic complaining of having trouble falling asleep over the past 2 weeks.  Patient states that she has never had this issue before and does not understand why she cannot get to sleep.  Patient states she will stay up until 3 or 4:00 in the morning sitting in bed reading a book, also laying there and trying to fall asleep but never actually falling asleep.  Patient denies any new stress in her life that would make her feel more anxious.  Patient has used Xanax in the past 0.25 mg on occasion, states was prescribed this after a family member passed away.  Patient states she has used multiple over-the-counter sleep aids including melatonin, Zzzquil, Benadryl without much success.  Also states she tried using her low-dose Xanax, but this did not help her to get to sleep either.  Patient does report that she has used a low-dose Ambien a few years ago after having a shoulder surgery, and this seemed to work to help her sleep.  Patient states she feels so exhausted but cannot fall asleep.  She is getting to the point that she is getting very frustrated with not being able to sleep and states "it is making my blood pressure high"  Patient Active Problem List   Diagnosis Date Noted  . Myalgia due to statin 12/10/2017  . Hyperlipidemia LDL goal <130 10/03/2017  . Hiatal hernia 07/03/2017  . Coronary artery disease 07/03/2017  . Hospital discharge follow-up 07/03/2017  . Chest pain 06/10/2017  . Olecranon fracture, left, closed, with routine healing, subsequent encounter 09/20/2016  . Pain in left wrist 09/20/2016  . Breast cancer screening by mammogram 09/20/2016  . Major depressive disorder, single episode 11/05/2015  . Exertional dyspnea 11/05/2015  . Hypothyroidism 04/28/2015  . Hypertension 01/13/2014  . Osteopenia 10/25/2013  . GERD (gastroesophageal reflux disease)  09/11/2013  . Encounter for preventive health examination 09/11/2013  . Dysphagia, pharyngoesophageal phase 09/10/2013  . Heart murmur 11/28/2012  . History of shingles   . Polymyalgia rheumatica ([place]) 11/18/2012  . Osteoarthritis of left shoulder 07/14/2012   Social History   Tobacco Use  . Smoking status: Former Smoker    Types: Cigarettes    Last attempt to quit: 07/30/1972    Years since quitting: 46.1  . Smokeless tobacco: Never Used  . Tobacco comment: smoked for only a few months  Substance Use Topics  . Alcohol use: Yes    Comment: OCCAS WINE    Review of Systems    Constitutional: Negative for chills, fatigue and fever.  HENT: Negative for congestion, ear pain, sinus pain and sore throat.   Eyes: Negative.   Respiratory: Negative for cough, shortness of breath and wheezing.   Cardiovascular: Negative for chest pain, palpitations and leg swelling.  Gastrointestinal: Negative for abdominal pain, diarrhea, nausea and vomiting.  Genitourinary: Negative for dysuria, frequency and urgency.  Musculoskeletal: Negative for arthralgias and myalgias.  Skin: Negative for color change, pallor and rash.  Neurological: Negative for syncope, light-headedness and headaches.  Psychiatric/Behavioral: The patient is not nervous/anxious. Cannot get to sleep.        Objective:   Physical Exam  Constitutional:  She appears well-developed and well-nourished. No distress.  HENT:  Head: Normocephalic and atraumatic.  Eyes: Pupils are equal, round, and reactive to light. EOM are normal. No scleral icterus.  Neck: Normal range of motion. Neck supple. No tracheal deviation present.  Cardiovascular: Normal rate, regular rhythm and normal heart sounds.  Pulmonary/Chest: Effort normal and breath sounds normal. No respiratory distress. She has no wheezes. She has no rales.  Neurological: She is alert and oriented to person, place, and time.  Gait normal  Skin: Skin is warm and dry. No pallor.    Psychiatric: She has a normal mood and affect. Her behavior is normal. Thought content normal.  Speech clear.  Good eye contact.  She is well-groomed.  Nursing note and vitals reviewed.   Vitals:   09/01/18 1129  BP: (!) 160/80  Pulse: 75  Resp: 16  Temp: 97.8 F (36.6 C)  SpO2: 95%      Assessment & Plan:    A total of 25  minutes were spent face-to-face with the patient during this encounter and over half of that time was spent on counseling and coordination of care. The patient was counseled on possible insomnia cause, use of ambien, good sleep hygeine.   Insomnia of unknown cause-we will check some blood work today including CBC, CMP, thyroid panel to see if there is anything in these basic labs that could be contributing to patient's inability to sleep.  Patient will trial low-dose Ambien 5 mg as needed to see if this helps her get to sleep and stay asleep.  Patient advised that if she is going to take an Ambien to not take Xanax with this medication.  Also discussed with patient another idea of trying low-dose trazodone if Ambien not effective.   Patient will follow-up in approximately 2 weeks to recheck on insomnia after addition of Ambien.

## 2018-09-01 NOTE — Patient Instructions (Addendum)
Try the ambien as needed to see if this helps like it has in past  DO NOT [place]  If this does not help, we can consider trazodone 25 or 50 mg before bedtime   Insomnia Insomnia is a sleep disorder that makes it difficult to fall asleep or stay asleep. Insomnia can cause fatigue, low energy, difficulty concentrating, mood swings, and poor performance at work or school. There are three different ways to classify insomnia:  Difficulty falling asleep.  Difficulty staying asleep.  Waking up too early in the morning. Any type of insomnia can be long-term (chronic) or short-term (acute). Both are common. Short-term insomnia usually lasts for three months or less. Chronic insomnia occurs at least three times a week for longer than three months. What are the causes? Insomnia may be caused by another condition, situation, or substance, such as:  Anxiety.  Certain medicines.  Gastroesophageal reflux disease (GERD) or other gastrointestinal conditions.  Asthma or other breathing conditions.  Restless legs syndrome, sleep apnea, or other sleep disorders.  Chronic pain.  Menopause.  Stroke.  Abuse of alcohol, tobacco, or illegal drugs.  Mental health conditions, such as depression.  Caffeine.  Neurological disorders, such as Alzheimer's disease.  An overactive thyroid (hyperthyroidism). Sometimes, the cause of insomnia may not be known. What increases the risk? Risk factors for insomnia include:  Gender. Women are affected more often than men.  Age. Insomnia is more common as you get older.  Stress.  Lack of exercise.  Irregular work schedule or working night shifts.  Traveling between different time zones.  Certain medical and mental health conditions. What are the signs or symptoms? If you have insomnia, the main symptom is having trouble falling asleep or having trouble staying asleep. This may lead to other symptoms, such as:  Feeling  fatigued or having low energy.  Feeling nervous about going to sleep.  Not feeling rested in the morning.  Having trouble concentrating.  Feeling irritable, anxious, or depressed. How is this diagnosed? This condition may be diagnosed based on:  Your symptoms and medical history. Your health care provider may ask about: ? Your sleep habits. ? Any medical conditions you have. ? Your mental health.  A physical exam. How is this treated? Treatment for insomnia depends on the cause. Treatment may focus on treating an underlying condition that is causing insomnia. Treatment may also include:  Medicines to help you sleep.  Counseling or therapy.  Lifestyle adjustments to help you sleep better. Follow these instructions at home: Eating and drinking   Limit or avoid alcohol, caffeinated beverages, and cigarettes, especially close to bedtime. These can disrupt your sleep.  Do not eat a large meal or eat spicy foods right before bedtime. This can lead to digestive discomfort that can make it hard for you to sleep. Sleep habits   Keep a sleep diary to help you and your health care provider figure out what could be causing your insomnia. Write down: ? When you sleep. ? When you wake up during the night. ? How well you sleep. ? How rested you feel the next day. ? Any side effects of medicines you are taking. ? What you eat and drink.  Make your bedroom a dark, comfortable place where it is easy to fall asleep. ? Put up shades or blackout curtains to block light from outside. ? Use a white noise machine to block noise. ? Keep the temperature cool.  Limit screen use before bedtime.  This includes: ? Watching TV. ? Using your smartphone, tablet, or computer.  Stick to a routine that includes going to bed and waking up at the same times every day and night. This can help you fall asleep faster. Consider making a quiet activity, such as reading, part of your nighttime  routine.  Try to avoid taking naps during the day so that you sleep better at night.  Get out of bed if you are still awake after 15 minutes of trying to sleep. Keep the lights down, but try reading or doing a quiet activity. When you feel sleepy, go back to bed. General instructions  Take over-the-counter and prescription medicines only as told by your health care provider.  Exercise regularly, as told by your health care provider. Avoid exercise starting several hours before bedtime.  Use relaxation techniques to manage stress. Ask your health care provider to suggest some techniques that may work well for you. These may include: ? Breathing exercises. ? Routines to release muscle tension. ? Visualizing peaceful scenes.  Make sure that you drive carefully. Avoid driving if you feel very sleepy.  Keep all follow-up visits as told by your health care provider. This is important. Contact a health care provider if:  You are tired throughout the day.  You have trouble in your daily routine due to sleepiness.  You continue to have sleep problems, or your sleep problems get worse. Get help right away if:  You have serious thoughts about hurting yourself or someone else. If you ever feel like you may hurt yourself or others, or have thoughts about taking your own life, get help right away. You can go to your nearest emergency department or call:  Your local emergency services (911 in the [place]  A suicide crisis helpline, such as the [facility] at 5415992846. This is open 24 hours a day. Summary  Insomnia is a sleep disorder that makes it difficult to fall asleep or stay asleep.  Insomnia can be long-term (chronic) or short-term (acute).  Treatment for insomnia depends on the cause. Treatment may focus on treating an underlying condition that is causing insomnia.  Keep a sleep diary to help you and your health care provider figure out what could be  causing your insomnia. This information is not intended to replace advice given to you by your health care provider. Make sure you discuss any questions you have with your health care provider. Document Released: 07/13/2000 Document Revised: 04/25/2017 Document Reviewed: 04/25/2017 Elsevier Interactive Patient Education  2019 [employer].

## 2018-09-03 LAB — THYROID PANEL WITH TSH
Free Thyroxine Index: 3 (ref 1.4–3.8)
T3 Uptake: 37 % — ABNORMAL HIGH (ref 22–35)
T4, Total: 8 ug/dL (ref 5.1–11.9)
TSH: 1.68 mIU/L (ref 0.40–4.50)

## 2018-09-19 ENCOUNTER — Other Ambulatory Visit: Payer: Self-pay | Admitting: Family Medicine

## 2018-09-19 DIAGNOSIS — G4700 Insomnia, unspecified: Secondary | ICD-10-CM

## 2018-10-06 ENCOUNTER — Ambulatory Visit (INDEPENDENT_AMBULATORY_CARE_PROVIDER_SITE_OTHER): Payer: Medicare Other | Admitting: Internal Medicine

## 2018-10-06 ENCOUNTER — Encounter: Payer: Self-pay | Admitting: Internal Medicine

## 2018-10-06 DIAGNOSIS — F5102 Adjustment insomnia: Secondary | ICD-10-CM

## 2018-10-06 DIAGNOSIS — M8580 Other specified disorders of bone density and structure, unspecified site: Secondary | ICD-10-CM

## 2018-10-06 DIAGNOSIS — K9049 Malabsorption due to intolerance, not elsewhere classified: Secondary | ICD-10-CM | POA: Diagnosis not present

## 2018-10-06 DIAGNOSIS — I10 Essential (primary) hypertension: Secondary | ICD-10-CM | POA: Diagnosis not present

## 2018-10-06 DIAGNOSIS — L989 Disorder of the skin and subcutaneous tissue, unspecified: Secondary | ICD-10-CM

## 2018-10-06 DIAGNOSIS — E034 Atrophy of thyroid (acquired): Secondary | ICD-10-CM | POA: Diagnosis not present

## 2018-10-06 DIAGNOSIS — M353 Polymyalgia rheumatica: Secondary | ICD-10-CM | POA: Diagnosis not present

## 2018-10-06 MED ORDER — TELMISARTAN 40 MG PO TABS: 40.0000 mg | ORAL_TABLET | Freq: Every day | ORAL | 1 refills | Status: DC

## 2018-10-06 MED ORDER — TEMAZEPAM 15 MG PO CAPS: 15.0000 mg | ORAL_CAPSULE | Freq: Every evening | ORAL | 0 refills | Status: DC | PRN

## 2018-10-06 NOTE — Progress Notes (Signed)
Subjective:  Patient ID: Kathryn Wise, female    DOB: 06/01/1930  Age: 85 y.o. MRN: [id number]  CC: Diagnoses of Polymyalgia rheumatica ([place]), Osteopenia, unspecified location, Hypothyroidism due to acquired atrophy of thyroid, Essential hypertension, Insomnia due to psychological stress, Food intolerance in adult, and Skin lesion of hand were pertinent to this visit.  HPI Kathryn Wise presents for 6 month follow up on hypertension . Patent arrived one hour early and insists that she was told that the time of her appointment was 1 pm .  She does not feel well due to multiple  Acute and chronic issues:   1) Treated for  New onset insomnia by LG with ambien 5 mg on Feb 3.   symptoms had been present for months,  Had tried all OTC meds including escalating doses of melatonin, .  States that when she lies down her brain starts working overtime bringing up old events.  Gets sleepy but can't fall asleep.  Denies anxiety and depression but does admit to being lonely.  Does not drink alcohol to excess; does not use I phones or other electronic devices.  Sleeping well with Lorrin Mais but now cannot sleep without it.    2) abdomen becomes  distended after eating anything but "soft food."  Denies constipation, abdominal pain , but distension makes her uncomfortable and feels she needs to lose weight..  Has never tried taking  beano,  did't tolerate a probiotic  3) joint pain with h/o PMR.  Insists that I told her to stop taking aleve,  Which helped.  Doesn't want prednisone.  Says tylenol doesn't help. Has not tried NSAID/APAP in combination   4) HTN:  Patient is taking her medications as prescribed and notes no adverse effects.  Home BP readings have been done about once per week and are  generally < 130/80 .  She is avoiding added salt in her diet and walking regularly about 3 times per week for exercise.  5) New skin lesion on right hand.  Appeared after  Her last dermatology visit had a squamous cell CA   Reportedly removed from dorsum of  left hand   . Outpatient Medications Prior to Visit  Medication Sig Dispense Refill  . ALPRAZolam (XANAX) 0.25 MG tablet TAKE 1 TABLET BY MOUTH AT BEDTIME AS NEEDED ANXIETY 30 tablet 5  . aspirin 81 MG tablet Take 81 mg by mouth daily.    . Cholecalciferol (VITAMIN D-3) 1000 units CAPS Take 1 capsule by mouth daily.    . Cyanocobalamin (VITAMIN B 12) 100 MCG LOZG Take 1 tablet by mouth daily.     . furosemide (LASIX) 20 MG tablet TAKE 1 TABLET BY MOUTH ONCE DAILY 30 tablet 5  . levothyroxine (SYNTHROID, LEVOTHROID) 88 MCG tablet TAKE 1 TABLET BY MOUTH ONCE DAILY ON AN EMPTY STOMACH. WAIT 30 MINUTES BEFORE TAKING OTHER MEDS. 90 tablet 0  . nitroGLYCERIN (NITROSTAT) 0.4 MG SL tablet Place 1 tablet (0.4 mg total) every 5 (five) minutes as needed under the tongue for chest pain. 30 tablet 2  . omeprazole (PRILOSEC) 20 MG capsule TAKE 1 CAPSULE BY MOUTH ONCE EVERY MORNING BEFORE BREAKFAST 90 capsule 2  . POTASSIUM PO Take 1 capsule by mouth daily.    . RESTASIS 0.05 % ophthalmic emulsion Place 1 drop into both eyes 2 (two) times daily.     [name] zolpidem (AMBIEN) 5 MG tablet TAKE 1 TABLET BY MOUTH AT BEDTIME AS NEEDED SLEEP 15 tablet 1  .  lisinopril (PRINIVIL,ZESTRIL) 20 MG tablet Take 1 tablet (20 mg total) by mouth daily. 90 tablet 1   No facility-administered medications prior to visit.     Review of Systems;  Patient denies headache, fevers, malaise, unintentional weight loss, skin rash, eye pain, sinus congestion and sinus pain, sore throat, dysphagia,  hemoptysis , cough, dyspnea, wheezing, chest pain, palpitations, orthopnea, edema, abdominal pain, nausea, melena, diarrhea, constipation, flank pain, dysuria, hematuria, urinary  Frequency, nocturia, numbness, tingling, seizures,  Focal weakness, Loss of consciousness,  Tremor, insomnia, depression, anxiety, and suicidal ideation.      Objective:  BP 110/70   Pulse 96   Temp 98 F (36.7 C) (Oral)   Ht 5'  6" (1.676 m)   Wt 156 lb 12.8 oz (71.1 kg)   SpO2 96%   BMI 25.31 kg/m   BP Readings from Last 3 Encounters:  10/06/18 110/70  09/01/18 (!) 160/80  04/07/18 140/68    Wt Readings from Last 3 Encounters:  10/06/18 156 lb 12.8 oz (71.1 kg)  09/01/18 159 lb 4 oz (72.2 kg)  04/07/18 159 lb 12.8 oz (72.5 kg)    General appearance: alert, cooperative and appears stated age Ears: normal TM's and external ear canals both ears Throat: lips, mucosa, and tongue normal; teeth and gums normal Neck: no adenopathy, no carotid bruit, supple, symmetrical, trachea midline and thyroid not enlarged, symmetric, no tenderness/mass/nodules Back: symmetric, no curvature. ROM normal. No CVA tenderness. Lungs: clear to auscultation bilaterally Heart: regular rate and rhythm, S1, S2 normal, no murmur, click, rub or gallop Abdomen: soft, non-tender; bowel sounds normal; no masses,  no organomegaly Pulses: 2+ and symmetric Skin: Skin color, texture, turgor normal. No rashes or lesions Lymph nodes: Cervical, supraclavicular, and axillary nodes normal.  Lab Results  Component Value Date   HGBA1C 6.3 10/01/2017   HGBA1C 6.1 12/08/2015    Lab Results  Component Value Date   CREATININE 0.94 09/01/2018   CREATININE 0.99 04/07/2018   CREATININE 1.11 10/01/2017    Lab Results  Component Value Date   WBC 7.8 09/01/2018   HGB 11.3 (L) 09/01/2018   HCT 35.3 (L) 09/01/2018   PLT 196.0 09/01/2018   GLUCOSE 94 09/01/2018   CHOL 181 10/01/2017   TRIG 113.0 10/01/2017   HDL 43.80 10/01/2017   LDLDIRECT 126.0 09/19/2016   LDLCALC 115 (H) 10/01/2017   ALT 13 09/01/2018   AST 18 09/01/2018   NA 136 09/01/2018   K 4.9 09/01/2018   CL 103 09/01/2018   CREATININE 0.94 09/01/2018   BUN 13 09/01/2018   CO2 29 09/01/2018   TSH 1.68 09/01/2018   INR 0.98 07/09/2012   HGBA1C 6.3 10/01/2017    Mm Screening Breast Tomo Bilateral  Result Date: 11/22/2017 CLINICAL DATA:  Screening. EXAM: DIGITAL SCREENING  BILATERAL MAMMOGRAM WITH TOMO AND CAD COMPARISON:  Previous exam(s). ACR Breast Density Category b: There are scattered areas of fibroglandular density. FINDINGS: There are no findings suspicious for malignancy. Images were processed with CAD. IMPRESSION: No mammographic evidence of malignancy. A result letter of this screening mammogram will be mailed directly to the patient. RECOMMENDATION: Screening mammogram in one year. (Code:SM-B-01Y) BI-RADS CATEGORY  1: Negative. Electronically Signed   By: Fidela Salisbury M.D.   On: 11/22/2017 09:48    Assessment & Plan:   Problem List Items Addressed This Visit    Polymyalgia rheumatica ([place])    Diagnosed in the past by rheumatology.  ESRs have been repeatedly normal.  Advised to try Aleve /  tylenol in combination for a week or 2 as a trial       Osteopenia    With prior fracture of olecranon. Treated with Evista until recently (September 2019 per notes,  But patient now states that she has not taken it in "years."  Repeat DEXA will be due Sept 2020       Hypertension    I am making a decision to change patient's lisinopril to telmisartan, based on increased anecdotal reports of life threateneng angioedema with ACE Inhibitors .  I also advised patient to take it at night instead of morning,  as recent studies have shown a favorable effect on CAD and CVA rates in those who do       Relevant Medications   telmisartan (MICARDIS) 40 MG tablet   Hypothyroidism    Thyroid function is WNL on current dose.  No current changes needed.   Lab Results  Component Value Date   TSH 1.68 09/01/2018         Insomnia due to psychological stress    Subacute onset.  Will transition from [place] to [employer] intolerance in adult    Advised to try using Beano before all meals containing vegetables and simethicone products prn       Skin lesion of hand    Advised to stop using Neosporin on new lesion and keep it covered,  If no resolution at  follow up will need to see Dermatology again         A total of 40 minutes was spent with patient more than half of which was spent in counseling patient on the above mentioned issues , reviewing and explaining recent labs and imaging studies done, and coordination of care.  I have discontinued Kathryn Wise's lisinopril. I am also having her start on temazepam and telmisartan. Additionally, I am having her maintain her Restasis, Vitamin B 12, aspirin, Vitamin D-3, POTASSIUM PO, nitroGLYCERIN, furosemide, omeprazole, ALPRAZolam, levothyroxine, and zolpidem.  Meds ordered this encounter  Medications  . temazepam (RESTORIL) 15 MG capsule    Sig: Take 1 capsule (15 mg total) by mouth at bedtime as needed for sleep.    Dispense:  30 capsule    Refill:  0  . telmisartan (MICARDIS) 40 MG tablet    Sig: Take 1 tablet (40 mg total) by mouth at bedtime.    Dispense:  90 tablet    Refill:  1    Medications Discontinued During This Encounter  Medication Reason  . lisinopril (PRINIVIL,ZESTRIL) 20 MG tablet     Follow-up: Return in about 2 months (around 12/06/2018) for insomnia, joint pain hypertension .   [name], MD

## 2018-10-06 NOTE — Patient Instructions (Addendum)
For your insomnia:  I am giving you an alternative to [place] called Restoril 15 mg .  Take it once daily 30 to 60 minutes prior to bedtime INSTEAD OF AMBIEN   2) FOR YOUR JOINT PAIN :  You may use 1 aleve every 12 hours and 2 tylenol every 12 hours   3) For your blood pressure:    I am making a decision to change your lisinopril to telmisartan, based on increased reports by one of my ENT colleagues of patients  developing tongue and throat swelling from lisinopril.  The condition , called "angioedema," can be fatal if a person's airway is compromised.  I also want you to take it at night instead of morning,  s recent studies have shown that taking your blood pressure medications at night protects you better from heart attacks and strokes.   4)) for your right leg lesion:    Stop using neosporin on your spot .  Keep it covered and clean .  If it persists for more than 2 more weeks,  See your dermatologist to rule out skin cancer .  5) Your belly gets distended due to gas caused by  the fiber  In fiber bars, and the time it takes to digest foods like  broccoli /cauliflower.  Try taking  2 "beano" with any of fiber bars or your meals that contain vegetables.  If you still get gassy,  You can take mylanta gas ,  Or Gax X,  Or Phasyme (they all contain simethicone) .

## 2018-10-07 DIAGNOSIS — K9049 Malabsorption due to intolerance, not elsewhere classified: Secondary | ICD-10-CM | POA: Insufficient documentation

## 2018-10-07 DIAGNOSIS — L989 Disorder of the skin and subcutaneous tissue, unspecified: Secondary | ICD-10-CM | POA: Insufficient documentation

## 2018-10-07 DIAGNOSIS — F5102 Adjustment insomnia: Secondary | ICD-10-CM | POA: Insufficient documentation

## 2018-10-07 NOTE — Assessment & Plan Note (Signed)
Subacute onset.  Will transition from [place] to Restoril

## 2018-10-07 NOTE — Assessment & Plan Note (Addendum)
Diagnosed in the past by rheumatology.  ESRs have been repeatedly normal.  Advised to try Aleve /tylenol in combination for a week or 2 as a trial

## 2018-10-07 NOTE — Assessment & Plan Note (Signed)
I am making a decision to change patient's lisinopril to telmisartan, based on increased anecdotal reports of life threateneng angioedema with ACE Inhibitors .  I also advised patient to take it at night instead of morning,  as recent studies have shown a favorable effect on CAD and CVA rates in those who do  

## 2018-10-07 NOTE — Assessment & Plan Note (Signed)
Advised to stop using Neosporin on new lesion and keep it covered,  If no resolution at follow up will need to see Dermatology again

## 2018-10-07 NOTE — Assessment & Plan Note (Signed)
Thyroid function is WNL on current dose.  No current changes needed.   Lab Results  Component Value Date   TSH 1.68 09/01/2018

## 2018-10-07 NOTE — Assessment & Plan Note (Signed)
Advised to try using Beano before all meals containing vegetables and simethicone products prn

## 2018-10-07 NOTE — Assessment & Plan Note (Signed)
With prior fracture of olecranon. Treated with Evista until recently (September 2019 per notes,  But patient now states that she has not taken it in "years."  Repeat DEXA will be due Sept 2020

## 2018-10-16 ENCOUNTER — Other Ambulatory Visit: Payer: Self-pay | Admitting: Internal Medicine

## 2018-10-16 ENCOUNTER — Ambulatory Visit: Payer: Self-pay

## 2018-10-16 ENCOUNTER — Other Ambulatory Visit: Payer: Self-pay

## 2018-10-16 ENCOUNTER — Ambulatory Visit (INDEPENDENT_AMBULATORY_CARE_PROVIDER_SITE_OTHER): Payer: Medicare Other | Admitting: Internal Medicine

## 2018-10-16 ENCOUNTER — Ambulatory Visit: Payer: Medicare Other

## 2018-10-16 VITALS — BP 190/88 | HR 87 | Resp 16

## 2018-10-16 DIAGNOSIS — H81399 Other peripheral vertigo, unspecified ear: Secondary | ICD-10-CM

## 2018-10-16 DIAGNOSIS — R42 Dizziness and giddiness: Secondary | ICD-10-CM

## 2018-10-16 DIAGNOSIS — H539 Unspecified visual disturbance: Secondary | ICD-10-CM

## 2018-10-16 DIAGNOSIS — I10 Essential (primary) hypertension: Secondary | ICD-10-CM

## 2018-10-16 MED ORDER — AMLODIPINE BESYLATE 5 MG PO TABS: 5.0000 mg | ORAL_TABLET | Freq: Every day | ORAL | 0 refills | Status: DC

## 2018-10-16 NOTE — Patient Instructions (Signed)
If you are still dizzy please tell Dr. Derrel Nip tomorrow and consider CT scan of head since you do not want to do one today. Stop Telmisartan if you feel like it is making you dizzy  Take norvasc 5 mg tonight  Discuss with Dr. Derrel Nip tomorrow    Hypertension Hypertension, commonly called high blood pressure, is when the force of blood pumping through the arteries is too strong. The arteries are the blood vessels that carry blood from the heart throughout the body. Hypertension forces the heart to work harder to pump blood and may cause arteries to become narrow or stiff. Having untreated or uncontrolled hypertension can cause heart attacks, strokes, kidney disease, and other problems. A blood pressure reading consists of a higher number over a lower number. Ideally, your blood pressure should be below 120/80. The first ("top") number is called the systolic pressure. It is a measure of the pressure in your arteries as your heart beats. The second ("bottom") number is called the diastolic pressure. It is a measure of the pressure in your arteries as the heart relaxes. What are the causes? The cause of this condition is not known. What increases the risk? Some risk factors for high blood pressure are under your control. Others are not. Factors you can change  Smoking.  Having type 2 diabetes mellitus, high cholesterol, or both.  Not getting enough exercise or physical activity.  Being overweight.  Having too much fat, sugar, calories, or salt (sodium) in your diet.  Drinking too much alcohol. Factors that are difficult or impossible to change  Having chronic kidney disease.  Having a family history of high blood pressure.  Age. Risk increases with age.  Race. You may be at higher risk if you are African-American.  Gender. Men are at higher risk than women before age 85. After age 98, women are at higher risk than men.  Having obstructive sleep apnea.  Stress. What are the signs or  symptoms? Extremely high blood pressure (hypertensive crisis) may cause:  Headache.  Anxiety.  Shortness of breath.  Nosebleed.  Nausea and vomiting.  Severe chest pain.  Jerky movements you cannot control (seizures). How is this diagnosed? This condition is diagnosed by measuring your blood pressure while you are seated, with your arm resting on a surface. The cuff of the blood pressure monitor will be placed directly against the skin of your upper arm at the level of your heart. It should be measured at least twice using the same arm. Certain conditions can cause a difference in blood pressure between your right and left arms. Certain factors can cause blood pressure readings to be lower or higher than normal (elevated) for a short period of time:  When your blood pressure is higher when you are in a health care provider's office than when you are at home, this is called white coat hypertension. Most people with this condition do not need medicines.  When your blood pressure is higher at home than when you are in a health care provider's office, this is called masked hypertension. Most people with this condition may need medicines to control blood pressure. If you have a high blood pressure reading during one visit or you have normal blood pressure with other risk factors:  You may be asked to return on a different day to have your blood pressure checked again.  You may be asked to monitor your blood pressure at home for 1 week or longer. If you are diagnosed with  hypertension, you may have other blood or imaging tests to help your health care provider understand your overall risk for other conditions. How is this treated? This condition is treated by making healthy lifestyle changes, such as eating healthy foods, exercising more, and reducing your alcohol intake. Your health care provider may prescribe medicine if lifestyle changes are not enough to get your blood pressure under  control, and if:  Your systolic blood pressure is above 130.  Your diastolic blood pressure is above 80. Your personal target blood pressure may vary depending on your medical conditions, your age, and other factors. Follow these instructions at home: Eating and drinking   Eat a diet that is high in fiber and potassium, and low in sodium, added sugar, and fat. An example eating plan is called the DASH (Dietary Approaches to Stop Hypertension) diet. To eat this way: ? Eat plenty of fresh fruits and vegetables. Try to fill half of your plate at each meal with fruits and vegetables. ? Eat whole grains, such as whole wheat pasta, brown rice, or whole grain bread. Fill about one quarter of your plate with whole grains. ? Eat or drink low-fat dairy products, such as skim milk or low-fat yogurt. ? Avoid fatty cuts of meat, processed or cured meats, and poultry with skin. Fill about one quarter of your plate with lean proteins, such as fish, chicken without skin, beans, eggs, and tofu. ? Avoid premade and processed foods. These tend to be higher in sodium, added sugar, and fat.  Reduce your daily sodium intake. Most people with hypertension should eat less than 1,500 mg of sodium a day.  Limit alcohol intake to no more than 1 drink a day for nonpregnant women and 2 drinks a day for men. One drink equals 12 oz of beer, 5 oz of wine, or 1 oz of hard liquor. Lifestyle   Work with your health care provider to maintain a healthy body weight or to lose weight. Ask what an ideal weight is for you.  Get at least 30 minutes of exercise that causes your heart to beat faster (aerobic exercise) most days of the week. Activities may include walking, swimming, or biking.  Include exercise to strengthen your muscles (resistance exercise), such as pilates or lifting weights, as part of your weekly exercise routine. Try to do these types of exercises for 30 minutes at least 3 days a week.  Do not use any  products that contain nicotine or tobacco, such as cigarettes and e-cigarettes. If you need help quitting, ask your health care provider.  Monitor your blood pressure at home as told by your health care provider.  Keep all follow-up visits as told by your health care provider. This is important. Medicines  Take over-the-counter and prescription medicines only as told by your health care provider. Follow directions carefully. Blood pressure medicines must be taken as prescribed.  Do not skip doses of blood pressure medicine. Doing this puts you at risk for problems and can make the medicine less effective.  Ask your health care provider about side effects or reactions to medicines that you should watch for. Contact a health care provider if:  You think you are having a reaction to a medicine you are taking.  You have headaches that keep coming back (recurring).  You feel dizzy.  You have swelling in your ankles.  You have trouble with your vision. Get help right away if:  You develop a severe headache or confusion.  You  have unusual weakness or numbness.  You feel faint.  You have severe pain in your chest or abdomen.  You vomit repeatedly.  You have trouble breathing. Summary  Hypertension is when the force of blood pumping through your arteries is too strong. If this condition is not controlled, it may put you at risk for serious complications.  Your personal target blood pressure may vary depending on your medical conditions, your age, and other factors. For most people, a normal blood pressure is less than 120/80.  Hypertension is treated with lifestyle changes, medicines, or a combination of both. Lifestyle changes include weight loss, eating a healthy, low-sodium diet, exercising more, and limiting alcohol. This information is not intended to replace advice given to you by your health care provider. Make sure you discuss any questions you have with your health care  provider. Document Released: 07/16/2005 Document Revised: 06/13/2016 Document Reviewed: 06/13/2016 Elsevier Interactive Patient Education  2019 [employer]   Dizziness Dizziness is a common problem. It is a feeling of unsteadiness or light-headedness. You may feel like you are about to faint. Dizziness can lead to injury if you stumble or fall. Anyone can become dizzy, but dizziness is more common in older adults. This condition can be caused by a number of things, including medicines, dehydration, or illness. Follow these instructions at home: Eating and drinking  Drink enough fluid to keep your urine clear or pale yellow. This helps to keep you from becoming dehydrated. Try to drink more clear fluids, such as water.  Do not drink alcohol.  Limit your caffeine intake if told to do so by your health care provider. Check ingredients and nutrition facts to see if a food or beverage contains caffeine.  Limit your salt (sodium) intake if told to do so by your health care provider. Check ingredients and nutrition facts to see if a food or beverage contains sodium. Activity  Avoid making quick movements. ? Rise slowly from chairs and steady yourself until you feel okay. ? In the morning, first sit up on the side of the bed. When you feel okay, stand slowly while you hold onto something until you know that your balance is fine.  If you need to stand in one place for a long time, move your legs often. Tighten and relax the muscles in your legs while you are standing.  Do not drive or use heavy machinery if you feel dizzy.  Avoid bending down if you feel dizzy. Place items in your home so that they are easy for you to reach without leaning over. Lifestyle  Do not use any products that contain nicotine or tobacco, such as cigarettes and e-cigarettes. If you need help quitting, ask your health care provider.  Try to reduce your stress level by using methods such as yoga or meditation. Talk  with your health care provider if you need help to manage your stress. General instructions  Watch your dizziness for any changes.  Take over-the-counter and prescription medicines only as told by your health care provider. Talk with your health care provider if you think that your dizziness is caused by a medicine that you are taking.  Tell a friend or a family member that you are feeling dizzy. If he or she notices any changes in your behavior, have this person call your health care provider.  Keep all follow-up visits as told by your health care provider. This is important. Contact a health care provider if:  Your dizziness does not  go away.  Your dizziness or light-headedness gets worse.  You feel nauseous.  You have reduced hearing.  You have new symptoms.  You are unsteady on your feet or you feel like the room is spinning. Get help right away if:  You vomit or have diarrhea and are unable to eat or drink anything.  You have problems talking, walking, swallowing, or using your arms, hands, or legs.  You feel generally weak.  You are not thinking clearly or you have trouble forming sentences. It may take a friend or family member to notice this.  You have chest pain, abdominal pain, shortness of breath, or sweating.  Your vision changes.  You have any bleeding.  You have a severe headache.  You have neck pain or a stiff neck.  You have a fever. These symptoms may represent a serious problem that is an emergency. Do not wait to see if the symptoms will go away. Get medical help right away. Call your local emergency services (911 in the [place] Do not drive yourself to the hospital. Summary  Dizziness is a feeling of unsteadiness or light-headedness. This condition can be caused by a number of things, including medicines, dehydration, or illness.  Anyone can become dizzy, but dizziness is more common in older adults.  Drink enough fluid to keep your urine clear or  pale yellow. Do not drink alcohol.  Avoid making quick movements if you feel dizzy. Monitor your dizziness for any changes. This information is not intended to replace advice given to you by your health care provider. Make sure you discuss any questions you have with your health care provider. Document Released: 01/09/2001 Document Revised: 08/18/2016 Document Reviewed: 08/18/2016 Elsevier Interactive Patient Education  2019 [employer].

## 2018-10-16 NOTE — Progress Notes (Signed)
No chief complaint on file.  Walk in pt 10/16/18 C/o dizziness worse since yesterday but x 3 days pounding in the back of her head and ringing in ears and blurry vision and she feels shaky. The only change has been Telmisartan 40 mg qd on 10/06/2018. She reports she takes her thyroid medication at night with the telmisartan   BP right 190/88 left 198/86 orthostatics lying 198/88 HR 82, sitting 190/86 HR 84, standing 180/84 HR 96    Review of Systems  Constitutional: Negative for weight loss.  HENT: Positive for tinnitus. Negative for hearing loss.   Eyes: Positive for blurred vision.  Respiratory: Negative for shortness of breath.   Cardiovascular: Negative for chest pain.       Mild ankle swelling new since telmisartan per pt    Skin: Negative for rash.  Neurological: Positive for dizziness. Negative for headaches.   Past Medical History:  Diagnosis Date  . Arthritis   . GERD (gastroesophageal reflux disease)   . Heart murmur    has had years and years  . History of shingles Dec 2013   treated with steroids , post op from shoulder surgery  . Hypertension   . Hypothyroidism   . Osteoarthritis of left shoulder 07/14/2012  . Polymyalgia rheumatica (HCC)    Past Surgical History:  Procedure Laterality Date  . ARTHOSCOPIC ROTAOR CUFF REPAIR     LEFT  10+  YEARS    . CATARACT EXTRACTION W/ INTRAOCULAR LENS IMPLANT     RIGHT EYE  . CATARACT EXTRACTION W/PHACO Left 03/28/2016   Procedure: CATARACT EXTRACTION PHACO AND INTRAOCULAR LENS PLACEMENT (IOC);  Surgeon: Leandrew Koyanagi, MD;  Location: [place];  Service: Ophthalmology;  Laterality: Left;  TORIC  . FACELIFT    . FOOT SURGERY     rt foot   TUMOR REMOVED  . JOINT REPLACEMENT Left Dec 2013   shoulder  . LEFT HEART CATH AND CORONARY ANGIOGRAPHY N/A 06/12/2017   Procedure: LEFT HEART CATH AND CORONARY ANGIOGRAPHY;  Surgeon: Minna Merritts, MD;  Location: [place] [facility];  Service: Cardiovascular;   Laterality: N/A;  . SKIN CANCER EXCISION    . TOTAL SHOULDER ARTHROPLASTY  07/14/2012   Procedure: TOTAL SHOULDER ARTHROPLASTY;  Surgeon: Johnny Bridge, MD;  Location: [place];  Service: Orthopedics;  Laterality: Left;   Family History  Problem Relation Age of Onset  . Cancer Daughter        Breast  . Breast cancer Daughter 65  . Cancer Son    Social History   Socioeconomic History  . Marital status: Widowed    Spouse name: Not on file  . Number of children: Not on file  . Years of education: Not on file  . Highest education level: Not on file  Occupational History  . Not on file  Social Needs  . Financial resource strain: Not hard at all  . Food insecurity:    Worry: Never true    Inability: Never true  . Transportation needs:    Medical: No    Non-medical: No  Tobacco Use  . Smoking status: Former Smoker    Types: Cigarettes    Last attempt to quit: 07/30/1972    Years since quitting: 46.2  . Smokeless tobacco: Never Used  . Tobacco comment: smoked for only a few months  Substance and Sexual Activity  . Alcohol use: Yes    Comment: OCCAS WINE   . Drug use: No  . Sexual activity: Never  Lifestyle  . Physical activity:    Days per week: 4 days    Minutes per session: Not on file  . Stress: Not at all  Relationships  . Social connections:    Talks on phone: Not on file    Gets together: Not on file    Attends religious service: Not on file    Active member of club or organization: Not on file    Attends meetings of clubs or organizations: Not on file    Relationship status: Not on file  . Intimate partner violence:    Fear of current or ex partner: Not on file    Emotionally abused: Not on file    Physically abused: Not on file    Forced sexual activity: Not on file  Other Topics Concern  . Not on file  Social History Narrative  . Not on file   No outpatient medications have been marked as taking for the 10/16/18 encounter (Office Visit) with  McLean-Scocuzza, Nino Glow, MD.   Allergies  Allergen Reactions  . Alendronate Swelling    And nausea   . Metoprolol Other (See Comments)    lethargy  . Oxycontin [Oxycodone Hcl]     Altered mental status   Recent Results (from the past 2160 hour(s))  CBC     Status: Abnormal   Collection Time: 09/01/18 12:08 PM  Result Value Ref Range   WBC 7.8 4.0 - 10.5 K/uL   RBC 4.29 3.87 - 5.11 Mil/uL   Platelets 196.0 150.0 - 400.0 K/uL   Hemoglobin 11.3 (L) 12.0 - 15.0 g/dL   HCT 35.3 (L) 36.0 - 46.0 %   MCV 82.2 78.0 - 100.0 fl   MCHC 32.1 30.0 - 36.0 g/dL   RDW 17.9 (H) 11.5 - 15.5 %  Comp Met (CMET)     Status: Abnormal   Collection Time: 09/01/18 12:08 PM  Result Value Ref Range   Sodium 136 135 - 145 mEq/L   Potassium 4.9 3.5 - 5.1 mEq/L   Chloride 103 96 - 112 mEq/L   CO2 29 19 - 32 mEq/L   Glucose, Bld 94 70 - 99 mg/dL   BUN 13 6 - 23 mg/dL   Creatinine, Ser 0.94 0.40 - 1.20 mg/dL   Total Bilirubin 0.5 0.2 - 1.2 mg/dL   Alkaline Phosphatase 45 39 - 117 U/L   AST 18 0 - 37 U/L   ALT 13 0 - 35 U/L   Total Protein 7.1 6.0 - 8.3 g/dL   Albumin 4.0 3.5 - 5.2 g/dL   Calcium 9.9 8.4 - 10.5 mg/dL   GFR 56.41 (L) >60.00 mL/min  Thyroid Panel With TSH     Status: Abnormal   Collection Time: 09/01/18 12:08 PM  Result Value Ref Range   T3 Uptake 37 (H) 22 - 35 %   T4, Total 8.0 5.1 - 11.9 mcg/dL   Free Thyroxine Index 3.0 1.4 - 3.8   TSH 1.68 0.40 - 4.50 mIU/L   Objective  There is no height or weight on file to calculate BMI. Wt Readings from Last 3 Encounters:  10/06/18 156 lb 12.8 oz (71.1 kg)  09/01/18 159 lb 4 oz (72.2 kg)  04/07/18 159 lb 12.8 oz (72.5 kg)   Temp Readings from Last 3 Encounters:  10/06/18 98 F (36.7 C) (Oral)  09/01/18 97.8 F (36.6 C) (Oral)  04/07/18 98 F (36.7 C) (Oral)   BP Readings from Last 3 Encounters:  10/06/18 110/70  09/01/18 [name])  160/80  04/07/18 140/68   Pulse Readings from Last 3 Encounters:  10/06/18 96  09/01/18 75   04/07/18 93    Physical Exam Vitals signs and nursing note reviewed.  Constitutional:      Appearance: Normal appearance. She is well-developed and well-groomed.  HENT:     Head: Normocephalic and atraumatic.     Nose: Nose normal.     Mouth/Throat:     Mouth: Mucous membranes are moist.     Pharynx: Oropharynx is clear.  Eyes:     Conjunctiva/sclera: Conjunctivae normal.     Pupils: Pupils are equal, round, and reactive to light.  Cardiovascular:     Rate and Rhythm: Normal rate and regular rhythm.     Heart sounds: Murmur present.  Pulmonary:     Effort: Pulmonary effort is normal.     Breath sounds: Normal breath sounds.  Skin:    General: Skin is warm and dry.  Neurological:     General: No focal deficit present.     Mental Status: She is alert and oriented to person, place, and time. Mental status is at baseline.     Cranial Nerves: No cranial nerve deficit.     Sensory: No sensory deficit.     Gait: Gait normal.     Comments: CN 2-12 grossly intact  Motor function 5/5 arms and legs  Normal sensory face and upper arms per pt legs abnormal this is chronic    Psychiatric:        Attention and Perception: Attention and perception normal.        Mood and Affect: Mood and affect normal.        Speech: Speech normal.        Behavior: Behavior normal. Behavior is cooperative.        Thought Content: Thought content normal.        Cognition and Memory: Cognition and memory normal.        Judgment: Judgment normal.     Assessment   1. HTN uncontrolled likely causing dizziness  Plan  1. Pt thinks she is having side effects from telmisartan 40 mg started 10/06/2018 and she has been taking the medication at night  BP elevated today see HPI  Will hold telmisartan for now f/u with PCP tomorrow  Start Norvasc 5 mg today  Consider CT head if dizziness continues pt declines today disc with PCP tomorrow  Provider: Dr. Olivia Mackie McLean-Scocuzza-Internal Medicine

## 2018-10-16 NOTE — Telephone Encounter (Signed)
See note from today  Pt declines CT head with dizziness and will see PCP at 1:30 pm 10/17/18  She does not like Telmisartan 40 mg at night and reports feeling dizzy with this new blood pressure medication and BP elevated today 180s-190s sbp   TMS

## 2018-10-16 NOTE — Telephone Encounter (Signed)
Pt called to say that she started a new medication (Telmisartan) last week and by Tuesday she noticed that she was feeling woozy.  She states that she would get up in the morning feeling off but it would pass.  She states that yesterday and today the feeling has stayed She is able to walk she just has a woozy feeling. She states that her vision is blurred as well.  She tried to check her BP but her machine has stopped working. She denies weakness on one side of her body. She has no other symptoms.  She really just wants to stop the medication for a night to see if it is the medication.  Pt was advised that she need office visit and advice from physician before stopping this medication Per protocol pt will go to urgent care tonight She need her granddaughter for transportation. Care advice read to patient. Pt verbalized understanding.   Reason for Disposition . Taking a medicine that could cause dizziness (e.g., blood pressure medications, diuretics)  Answer Assessment - Initial Assessment Questions 1. DESCRIPTION: "Describe your dizziness."     Woozy and weak 2. LIGHTHEADED: "Do you feel lightheaded?" (e.g., somewhat faint, woozy, weak upon standing)     woozy 3. VERTIGO: "Do you feel like either you or the room is spinning or tilting?" (i.e. vertigo)     no 4. SEVERITY: "How bad is it?"  "Do you feel like you are going to faint?" "Can you stand and walk?"   - MILD - walking normally   - MODERATE - interferes with normal activities (e.g., work, school)    - SEVERE - unable to stand, requires support to walk, feels like passing out now.      Walks normally 5. ONSET:  "When did the dizziness begin?"     Last Monday 6. AGGRAVATING FACTORS: "Does anything make it worse?" (e.g., standing, change in head position)     trembly 7. HEART RATE: "Can you tell me your heart rate?" "How many beats in 15 seconds?"  (Note: not all patients can do this)       no 8. CAUSE: "What do you think is causing the  dizziness?"   unsure 9. RECURRENT SYMPTOM: "Have you had dizziness before?" If so, ask: "When was the last time?" "What happened that time?"     no 10. OTHER SYMPTOMS: "Do you have any other symptoms?" (e.g., fever, chest pain, vomiting, diarrhea, bleeding)      Blurred vision 11. PREGNANCY: "Is there any chance you are pregnant?" "When was your last menstrual period?"       N/A  Protocols used: DIZZINESS [facility]

## 2018-10-16 NOTE — Progress Notes (Deleted)
No chief complaint on file.  HPI ROS Past Medical History:  Diagnosis Date  . Arthritis   . GERD (gastroesophageal reflux disease)   . Heart murmur    has had years and years  . History of shingles Dec 2013   treated with steroids , post op from shoulder surgery  . Hypertension   . Hypothyroidism   . Osteoarthritis of left shoulder 07/14/2012  . Polymyalgia rheumatica (HCC)    Past Surgical History:  Procedure Laterality Date  . ARTHOSCOPIC ROTAOR CUFF REPAIR     LEFT  10+  YEARS    . CATARACT EXTRACTION W/ INTRAOCULAR LENS IMPLANT     RIGHT EYE  . CATARACT EXTRACTION W/PHACO Left 03/28/2016   Procedure: CATARACT EXTRACTION PHACO AND INTRAOCULAR LENS PLACEMENT (IOC);  Surgeon: Leandrew Koyanagi, MD;  Location: [place];  Service: Ophthalmology;  Laterality: Left;  TORIC  . FACELIFT    . FOOT SURGERY     rt foot   TUMOR REMOVED  . JOINT REPLACEMENT Left Dec 2013   shoulder  . LEFT HEART CATH AND CORONARY ANGIOGRAPHY N/A 06/12/2017   Procedure: LEFT HEART CATH AND CORONARY ANGIOGRAPHY;  Surgeon: Minna Merritts, MD;  Location: [place] [facility];  Service: Cardiovascular;  Laterality: N/A;  . SKIN CANCER EXCISION    . TOTAL SHOULDER ARTHROPLASTY  07/14/2012   Procedure: TOTAL SHOULDER ARTHROPLASTY;  Surgeon: Johnny Bridge, MD;  Location: [place];  Service: Orthopedics;  Laterality: Left;   Family History  Problem Relation Age of Onset  . Cancer Daughter        Breast  . Breast cancer Daughter 88  . Cancer Son    Social History   Socioeconomic History  . Marital status: Widowed    Spouse name: Not on file  . Number of children: Not on file  . Years of education: Not on file  . Highest education level: Not on file  Occupational History  . Not on file  Social Needs  . Financial resource strain: Not hard at all  . Food insecurity:    Worry: Never true    Inability: Never true  . Transportation needs:    Medical: No    Non-medical: No  Tobacco Use   . Smoking status: Former Smoker    Types: Cigarettes    Last attempt to quit: 07/30/1972    Years since quitting: 46.2  . Smokeless tobacco: Never Used  . Tobacco comment: smoked for only a few months  Substance and Sexual Activity  . Alcohol use: Yes    Comment: OCCAS WINE   . Drug use: No  . Sexual activity: Never  Lifestyle  . Physical activity:    Days per week: 4 days    Minutes per session: Not on file  . Stress: Not at all  Relationships  . Social connections:    Talks on phone: Not on file    Gets together: Not on file    Attends religious service: Not on file    Active member of club or organization: Not on file    Attends meetings of clubs or organizations: Not on file    Relationship status: Not on file  . Intimate partner violence:    Fear of current or ex partner: Not on file    Emotionally abused: Not on file    Physically abused: Not on file    Forced sexual activity: Not on file  Other Topics Concern  . Not on file  Social History Narrative  . Not on file   No outpatient medications have been marked as taking for the 10/16/18 encounter (Orders Only) with McLean-Scocuzza, Nino Glow, MD.   Allergies  Allergen Reactions  . Alendronate Swelling    And nausea   . Metoprolol Other (See Comments)    lethargy  . Oxycontin [Oxycodone Hcl]     Altered mental status   Recent Results (from the past 2160 hour(s))  CBC     Status: Abnormal   Collection Time: 09/01/18 12:08 PM  Result Value Ref Range   WBC 7.8 4.0 - 10.5 K/uL   RBC 4.29 3.87 - 5.11 Mil/uL   Platelets 196.0 150.0 - 400.0 K/uL   Hemoglobin 11.3 (L) 12.0 - 15.0 g/dL   HCT 35.3 (L) 36.0 - 46.0 %   MCV 82.2 78.0 - 100.0 fl   MCHC 32.1 30.0 - 36.0 g/dL   RDW 17.9 (H) 11.5 - 15.5 %  Comp Met (CMET)     Status: Abnormal   Collection Time: 09/01/18 12:08 PM  Result Value Ref Range   Sodium 136 135 - 145 mEq/L   Potassium 4.9 3.5 - 5.1 mEq/L   Chloride 103 96 - 112 mEq/L   CO2 29 19 - 32 mEq/L    Glucose, Bld 94 70 - 99 mg/dL   BUN 13 6 - 23 mg/dL   Creatinine, Ser 0.94 0.40 - 1.20 mg/dL   Total Bilirubin 0.5 0.2 - 1.2 mg/dL   Alkaline Phosphatase 45 39 - 117 U/L   AST 18 0 - 37 U/L   ALT 13 0 - 35 U/L   Total Protein 7.1 6.0 - 8.3 g/dL   Albumin 4.0 3.5 - 5.2 g/dL   Calcium 9.9 8.4 - 10.5 mg/dL   GFR 56.41 (L) >60.00 mL/min  Thyroid Panel With TSH     Status: Abnormal   Collection Time: 09/01/18 12:08 PM  Result Value Ref Range   T3 Uptake 37 (H) 22 - 35 %   T4, Total 8.0 5.1 - 11.9 mcg/dL   Free Thyroxine Index 3.0 1.4 - 3.8   TSH 1.68 0.40 - 4.50 mIU/L   Objective  There is no height or weight on file to calculate BMI. Wt Readings from Last 3 Encounters:  10/06/18 156 lb 12.8 oz (71.1 kg)  09/01/18 159 lb 4 oz (72.2 kg)  04/07/18 159 lb 12.8 oz (72.5 kg)   Temp Readings from Last 3 Encounters:  10/06/18 98 F (36.7 C) (Oral)  09/01/18 97.8 F (36.6 C) (Oral)  04/07/18 98 F (36.7 C) (Oral)   BP Readings from Last 3 Encounters:  10/06/18 110/70  09/01/18 (!) 160/80  04/07/18 140/68   Pulse Readings from Last 3 Encounters:  10/06/18 96  09/01/18 75  04/07/18 93    Physical Exam  Assessment  Plan   Provider: Dr. Olivia Mackie McLean-Scocuzza-Internal Medicine

## 2018-10-16 NOTE — Telephone Encounter (Signed)
Pt came in to be seen as a nurse visit and Dr. Aundra Dubin will see the pt.

## 2018-10-17 ENCOUNTER — Ambulatory Visit (INDEPENDENT_AMBULATORY_CARE_PROVIDER_SITE_OTHER): Payer: Medicare Other | Admitting: Internal Medicine

## 2018-10-17 ENCOUNTER — Ambulatory Visit: Payer: Medicare Other

## 2018-10-17 ENCOUNTER — Encounter: Payer: Self-pay | Admitting: Internal Medicine

## 2018-10-17 ENCOUNTER — Ambulatory Visit
Admission: RE | Admit: 2018-10-17 | Discharge: 2018-10-17 | Disposition: A | Discharge status: Home / Self Care | Payer: Medicare Other | Source: Ambulatory Visit | Attending: Internal Medicine | Admitting: Internal Medicine

## 2018-10-17 ENCOUNTER — Ambulatory Visit: Payer: Medicare Other | Admitting: Internal Medicine

## 2018-10-17 DIAGNOSIS — H539 Unspecified visual disturbance: Secondary | ICD-10-CM | POA: Insufficient documentation

## 2018-10-17 DIAGNOSIS — I10 Essential (primary) hypertension: Secondary | ICD-10-CM | POA: Diagnosis not present

## 2018-10-17 DIAGNOSIS — H81399 Other peripheral vertigo, unspecified ear: Secondary | ICD-10-CM | POA: Insufficient documentation

## 2018-10-17 DIAGNOSIS — H9313 Tinnitus, bilateral: Secondary | ICD-10-CM | POA: Diagnosis not present

## 2018-10-17 DIAGNOSIS — R42 Dizziness and giddiness: Secondary | ICD-10-CM | POA: Diagnosis not present

## 2018-10-17 DIAGNOSIS — H538 Other visual disturbances: Secondary | ICD-10-CM

## 2018-10-17 DIAGNOSIS — G311 Senile degeneration of brain, not elsewhere classified: Secondary | ICD-10-CM | POA: Diagnosis not present

## 2018-10-17 NOTE — Progress Notes (Signed)
Subjective:  Patient ID: Kathryn Wise, female    DOB: [birth year]  Age: 85 y.o. MRN: [id number]  CC: Diagnoses of Essential hypertension, Tinnitus aurium, bilateral, and Blurred vision, bilateral were pertinent to this visit.  HPI Kathryn Wise presents for follow up on adverse reaction attributed to change in hypertension medication from lisinopril to telmisartan, which occurred after  March 9 office visit . Patient is accompanied by her granddaughter  Kathryn Wise, because her vision has not returned to normal and she is fearful of driving.  She started taking telmisartan on march 10.  Symptoms of malaise,  Weakness and tremulousness started on March 15.    She was seen on March 19 by Dr Aundra Dubin as a walk in for persistent symptoms of dizziness  accompanied by blurred vision, tinnitus  and occipital headache . BP was very elevated.  She was advised by Dr Aundra Dubin  but  refused to have a head CT because she was going to have to wait until 8 pm.  She was offered 8 am slot this morning and declined that as well due to concern about inconveniencing her family for transportation issues (she was advised not to drive because of blurred vision)   She was advised to stop the telmisartan and start amlodipine 5 mg daily .  Her first dose was yesterday afternoon around 6 pm and she notes that subsequent home B{ reading transiently improved,  But was 170 systolic by bedtime.  she has been checking it four times daily and the readings have been quite labile.   Morning bp this morning was 127/78.    164/88   151/77 and now 174/86   Granddaughter states that patient has been very anxious lately , aggravated by stock market losses due to the [place] virus epidemic.   She reports that the tinnitis has persisted   But the dizziness and shakiness  Has resolved.     Outpatient Medications Prior to Visit  Medication Sig Dispense Refill  . ALPRAZolam (XANAX) 0.25 MG tablet TAKE 1 TABLET BY MOUTH AT BEDTIME AS NEEDED ANXIETY  30 tablet 5  . amLODipine (NORVASC) 5 MG tablet Take 1 tablet (5 mg total) by mouth daily. 30 tablet 0  . aspirin 81 MG tablet Take 81 mg by mouth daily.    . Cholecalciferol (VITAMIN D-3) 1000 units CAPS Take 1 capsule by mouth daily.    . Cyanocobalamin (VITAMIN B 12) 100 MCG LOZG Take 1 tablet by mouth daily.     . furosemide (LASIX) 20 MG tablet TAKE 1 TABLET BY MOUTH ONCE DAILY 30 tablet 5  . levothyroxine (SYNTHROID, LEVOTHROID) 88 MCG tablet TAKE 1 TABLET BY MOUTH ONCE DAILY ON AN EMPTY STOMACH. WAIT 30 MINUTES BEFORE TAKING OTHER MEDS. 90 tablet 0  . nitroGLYCERIN (NITROSTAT) 0.4 MG SL tablet Place 1 tablet (0.4 mg total) every 5 (five) minutes as needed under the tongue for chest pain. 30 tablet 2  . omeprazole (PRILOSEC) 20 MG capsule TAKE 1 CAPSULE BY MOUTH ONCE EVERY MORNING BEFORE BREAKFAST 90 capsule 2  . POTASSIUM PO Take 1 capsule by mouth daily.    . RESTASIS 0.05 % ophthalmic emulsion Place 1 drop into both eyes 2 (two) times daily.     . temazepam (RESTORIL) 15 MG capsule Take 1 capsule (15 mg total) by mouth at bedtime as needed for sleep. 30 capsule 0  . zolpidem (AMBIEN) 5 MG tablet TAKE 1 TABLET BY MOUTH AT BEDTIME AS NEEDED SLEEP 15 tablet 1  .  telmisartan (MICARDIS) 40 MG tablet Take 1 tablet (40 mg total) by mouth at bedtime. (Patient not taking: Reported on 10/17/2018) 90 tablet 1   No facility-administered medications prior to visit.     Review of Systems;  Patient denies headache, fevers, malaise, unintentional weight loss, skin rash, eye pain, sinus congestion and sinus pain, sore throat, dysphagia,  hemoptysis , cough, dyspnea, wheezing, chest pain, palpitations, orthopnea, edema, abdominal pain, nausea, melena, diarrhea, constipation, flank pain, dysuria, hematuria, urinary  Frequency, nocturia, numbness, tingling, seizures,  Focal weakness, Loss of consciousness,  Tremor, insomnia, depression, anxiety, and suicidal ideation.      Objective:  BP (!) 174/86 (BP  Location: Left Arm, Patient Position: Sitting, Cuff Size: Normal)   Pulse 85   Temp 98 F (36.7 C) (Oral)   Resp 16   Ht 5\' 6"  (1.676 m)   Wt 156 lb 3.2 oz (70.9 kg)   SpO2 93%   BMI 25.21 kg/m   BP Readings from Last 3 Encounters:  10/17/18 (!) 174/86  10/16/18 (!) 190/88  10/06/18 110/70    Wt Readings from Last 3 Encounters:  10/17/18 156 lb 3.2 oz (70.9 kg)  10/06/18 156 lb 12.8 oz (71.1 kg)  09/01/18 159 lb 4 oz (72.2 kg)    General appearance: alert, anxious , cooperative and appears stated age Ears: normal TM's and external ear canals both ears Throat: lips, mucosa, and tongue normal; teeth and gums normal Neck: no adenopathy, no carotid bruit, supple, symmetrical, trachea midline and thyroid not enlarged, symmetric, no tenderness/mass/nodules Back: symmetric, no curvature. ROM normal. No CVA tenderness. Lungs: clear to auscultation bilaterally Heart: regular rate and rhythm, Grade 3 systolic murmur at left sternal border, non radiating , no  click, rub or gallop Pulses: 2+ and symmetric  Neuro:  awake and interactive with normal mood and affect. Higher cortical functions are normal. Speech is clear without word-finding difficulty or dysarthria. Extraocular movements are intact. Visual fields of both eyes are grossly intact. Sensation to light touch is grossly intact bilaterally of upper and lower extremities. Motor examination shows 4+/5 symmetric hand grip and upper extremity and 5/5 lower extremity strength. There is no pronation or drift. Gait is non-ataxic    Lab Results  Component Value Date   HGBA1C 6.3 10/01/2017   HGBA1C 6.1 12/08/2015    Lab Results  Component Value Date   CREATININE 0.94 09/01/2018   CREATININE 0.99 04/07/2018   CREATININE 1.11 10/01/2017    Lab Results  Component Value Date   WBC 7.8 09/01/2018   HGB 11.3 (L) 09/01/2018   HCT 35.3 (L) 09/01/2018   PLT 196.0 09/01/2018   GLUCOSE 94 09/01/2018   CHOL 181 10/01/2017   TRIG  113.0 10/01/2017   HDL 43.80 10/01/2017   LDLDIRECT 126.0 09/19/2016   LDLCALC 115 (H) 10/01/2017   ALT 13 09/01/2018   AST 18 09/01/2018   NA 136 09/01/2018   K 4.9 09/01/2018   CL 103 09/01/2018   CREATININE 0.94 09/01/2018   BUN 13 09/01/2018   CO2 29 09/01/2018   TSH 1.68 09/01/2018   INR 0.98 07/09/2012   HGBA1C 6.3 10/01/2017    Mm Screening Breast Tomo Bilateral  Result Date: 11/22/2017 CLINICAL DATA:  Screening. EXAM: DIGITAL SCREENING BILATERAL MAMMOGRAM WITH TOMO AND CAD COMPARISON:  Previous exam(s). ACR Breast Density Category b: There are scattered areas of fibroglandular density. FINDINGS: There are no findings suspicious for malignancy. Images were processed with CAD. IMPRESSION: No mammographic evidence of malignancy. A  result letter of this screening mammogram will be mailed directly to the patient. RECOMMENDATION: Screening mammogram in one year. (Code:SM-B-01Y) BI-RADS CATEGORY  1: Negative. Electronically Signed   By: Fidela Salisbury M.D.   On: 11/22/2017 09:48    Assessment & Plan:   Problem List Items Addressed This Visit    Tinnitus aurium, bilateral    Patient is a difficult historian so the chronicity Is unclear.  She does take furosemide.  If symptoms persist,  Will suspend it.        Hypertension    Uncontrolled on amlodipine 5 mg daily . Metoprolol had been stopped in2019 due to intolerance.  Lisinopril stopped 2 weeks ago  due to safety issues raised by increased local reports of angioedema by ENT colleagues .  Resume telmisartan at bedtime  For systolic > 102  And follow readings.       Blurred vision, bilateral    Neurologic exam is normal including fields of vision.  STAT CT head done today was negative for acute infarct and mass.  Ethmoid sinus thickening may be affecting her  balance given her complaint of dizziness. Atherosclerotic and small vessel ischemic changes are noted,, but she is intolerant of statins by prior trials.        A total  of 40 minutes was spent with patient more than half of which was spent in counseling patient on the above mentioned issues , reviewing and explaining recent labs and imaging studies done, and coordination of care.  I am having Luetta B. Jindra maintain her Restasis, Vitamin B 12, aspirin, Vitamin D-3, POTASSIUM PO, nitroGLYCERIN, furosemide, omeprazole, ALPRAZolam, levothyroxine, zolpidem, temazepam, telmisartan, and amLODipine.  No orders of the defined types were placed in this encounter.   There are no discontinued medications.  Follow-up: No follow-ups on file.   [name], MD

## 2018-10-17 NOTE — Patient Instructions (Addendum)
Continue amlodipine 5 mg in the afternoon and add back telmisartan at bedtime if BP is > 150   On Saturday move the amlodipine to lunchtime and keep the telmisartan at bedtime   If the malaise and the pounding  Recurs ,  Stop the telmisartan,  and take an extra amlodipine 5 mg instead of the telmisartan  Give Kathryn Wise your readings and she can e mail them to me for medication adjustment

## 2018-10-19 ENCOUNTER — Encounter: Payer: Self-pay | Admitting: Internal Medicine

## 2018-10-19 ENCOUNTER — Telehealth: Payer: Self-pay | Admitting: Internal Medicine

## 2018-10-19 DIAGNOSIS — R4182 Altered mental status, unspecified: Secondary | ICD-10-CM | POA: Insufficient documentation

## 2018-10-19 DIAGNOSIS — H538 Other visual disturbances: Secondary | ICD-10-CM | POA: Insufficient documentation

## 2018-10-19 DIAGNOSIS — I10 Essential (primary) hypertension: Secondary | ICD-10-CM

## 2018-10-19 DIAGNOSIS — H9313 Tinnitus, bilateral: Secondary | ICD-10-CM | POA: Insufficient documentation

## 2018-10-19 NOTE — Assessment & Plan Note (Signed)
Neurologic exam is normal including fields of vision.  STAT CT head done today was negative for acute infarct and mass.  Ethmoid sinus thickening may be affecting her  balance given her complaint of dizziness. Atherosclerotic and small vessel ischemic changes are noted,, but she is intolerant of statins by prior trials.

## 2018-10-19 NOTE — Assessment & Plan Note (Addendum)
Patient is a difficult historian so the chronicity Is unclear.  She does take furosemide.  If symptoms persist,  Will suspend it.

## 2018-10-19 NOTE — Assessment & Plan Note (Addendum)
Uncontrolled on amlodipine 5 mg daily . Metoprolol had been stopped in2019 due to intolerance.  Lisinopril stopped 2 weeks ago  due to safety issues raised by increased local reports of angioedema by ENT colleagues .  Resume telmisartan at bedtime  For systolic > 257  And follow readings.

## 2018-10-19 NOTE — Telephone Encounter (Signed)
Her  CT head done  On Friday  was negative for acute infarct and mass.  Ethmoid sinus thickening may be affecting her  balance given her complaint of dizziness, so  I would start taking an anthistamine WITHOUT A DECONGESTANT (alleglra,  Zyrtec or clairitin without sudafed) . Atherosclerotic and small vessel ischemic changes are noted,, but she is intolerant of statins by prior trials.  If she is not taking a baby aspirin daily I would advise it.  If the ringing in her ears is not chronic (she was unable to nail down the chronicity of this complaint),  I would suspend the furosemide for a week to see if it improves.

## 2018-10-21 MED ORDER — AMLODIPINE BESYLATE 5 MG PO TABS: 10.0000 mg | ORAL_TABLET | Freq: Every day | ORAL | 0 refills | Status: DC

## 2018-10-21 NOTE — Telephone Encounter (Signed)
Have her increase the amlodipine to 10 mg daily and  Follow her blood pressures.  She can divide the dose into 5 mg bid or she can take the entire dose  at bedtime

## 2018-10-21 NOTE — Telephone Encounter (Signed)
Spoke with pt and informed her of the CT scan results. The pt stated that she does take a baby aspirin every day and has for man years. The pt also stated that she never experienced any dizziness or the ringing in her ears until she started taking the the telmisartan. The pt stated that she did not take it for the two days you told her not to and she felt good and then she started back on it and the dizziness, headache and ringing in her ears returned. Pt stated that she does not want to take the Telmisartan any more and is wandering how much of the amlodipine she should take daily since she is stopping the telmisartan.

## 2018-10-21 NOTE — Telephone Encounter (Signed)
Spoke with pt and informed her that she should increase her amlodipine to 10mg  daily and either divide into 5mg  twice daily or take the 10mg  entire dose at bedtime and record the readings and let us know. Pt gave a verbal understanding.

## 2018-10-30 ENCOUNTER — Ambulatory Visit: Payer: Self-pay | Admitting: *Deleted

## 2018-10-30 ENCOUNTER — Other Ambulatory Visit: Payer: Self-pay | Admitting: *Deleted

## 2018-10-30 MED ORDER — LISINOPRIL 20 MG PO TABS: 20.0000 mg | ORAL_TABLET | Freq: Every day | ORAL | 1 refills | Status: DC

## 2018-10-30 MED ORDER — LEVOTHYROXINE SODIUM 88 MCG PO TABS: ORAL_TABLET | ORAL | 0 refills | Status: DC

## 2018-10-30 NOTE — Telephone Encounter (Signed)
Patient was started on new BP medication 10/06/18- she is presently using Amlodipine - she is taking 5 mg am/pm. Patient states she is having pounding in the back of her head, BP 151/79, feet/ankles started swelling yesterday. Patient wants to go back on her original medication.  Reason for Disposition . Caller has URGENT medication question about med that PCP prescribed and triager unable to answer question    Patient feels that the new BP is not helping her- she has tried 2 different medications and she has not responder well- she wants to go back on the BP medication she had been taking for years.  Answer Assessment - Initial Assessment Questions 1. SYMPTOMS: "Do you have any symptoms?"     Head pounding, shaky, feet/ankles swelling- new medication not agreeing with patient 2. SEVERITY: If symptoms are present, ask "Are they mild, moderate or severe?"     Patient does not want to continue the medication- she wants her old BP medication called in. Please let her know if this can be done as soon as possible.  Protocols used: MEDICATION QUESTION CALL-A-AH

## 2018-10-30 NOTE — Telephone Encounter (Signed)
LISINOPRIL 20 MG  SENT TO [facility].  REDUCE AMLODIPINE TO 5 MG DAILY

## 2018-10-30 NOTE — Telephone Encounter (Signed)
Patient aware and will start lisinopril 20 mg q day, if BP not below 140 in one week she will increase to 40 mg q day. Advised to let us know what BP is and if she has to increase

## 2018-10-30 NOTE — Telephone Encounter (Signed)
Yes she can stop the amlodipine completely and resume the lisinopril .  If bp is not < 140 after a week,  Increase the lisinopril to 40 mg daily

## 2018-10-30 NOTE — Telephone Encounter (Signed)
Spoke with pt and she stated that the amlodipine is the one that is giving her such a hard time. She stated that her feet has been swelling, giving her pounding headaches and body has been shaky. Pt is nervous to continue taking this medication. Pt is wondering if she can just go back to taking the lisinopril once daily like she has "for the last 20 years" .

## 2018-10-30 NOTE — Telephone Encounter (Signed)
Requested Prescriptions  Pending Prescriptions Disp Refills  . levothyroxine (SYNTHROID, LEVOTHROID) 88 MCG tablet 90 tablet 0    Sig: TAKE 1 TABLET BY MOUTH ONCE DAILY ON AN EMPTY STOMACH. WAIT 30 MINUTES BEFORE TAKING OTHER MEDS.     Endocrinology:  Hypothyroid Agents Failed - 10/30/2018 10:23 AM      Failed - TSH needs to be rechecked within 3 months after an abnormal result. Refill until TSH is due.      Passed - TSH in normal range and within 360 days    TSH  Date Value Ref Range Status  09/01/2018 1.68 0.40 - 4.50 mIU/L Final         Passed - Valid encounter within last 12 months    Recent Outpatient Visits          1 week ago Essential hypertension   [place] [name], MD   2 weeks ago Essential hypertension   [place] McLean-Scocuzza, Nino Glow, MD   3 weeks ago Polymyalgia rheumatica [facility])   [place] [name], MD   1 month ago Insomnia, unspecified type   [place] Guse, Jacquelynn Cree, FNP   6 months ago Essential hypertension   [facility] [name], MD      Future Appointments            In 1 month Derrel Nip, Aris Everts, MD [place], [place]   In 5 months O'Brien-Blaney, Denisa L, LPN; [name], MD [facility] [facility], [facility]

## 2018-10-30 NOTE — Telephone Encounter (Signed)
While triaging patient for medication reaction to BP medication patient requested refill of thyroid medication. Request submitted per patient request

## 2018-11-07 ENCOUNTER — Ambulatory Visit: Payer: Self-pay | Admitting: Internal Medicine

## 2018-11-07 NOTE — Telephone Encounter (Signed)
Pt. Reports she had to increase her Lisinopril to 40 mg this morning. She will continue to monitor her blood pressure and call back Monday morning with a report. Denies any symptoms. Instructed if she develops symptoms to call back. Verbalizes understanding.  Answer Assessment - Initial Assessment Questions 1. BLOOD PRESSURE: "What is the blood pressure?" "Did you take at least two measurements 5 minutes apart?"     163/86 2. ONSET: "When did you take your blood pressure?"     This morning 3. HOW: "How did you obtain the blood pressure?" (e.g., visiting nurse, automatic home BP monitor)     Home monitor 4. HISTORY: "Do you have a history of high blood pressure?"     Yes 5. MEDICATIONS: "Are you taking any medications for blood pressure?" "Have you missed any doses recently?"     Yes 6. OTHER SYMPTOMS: "Do you have any symptoms?" (e.g., headache, chest pain, blurred vision, difficulty breathing, weakness)     No symptoms 7. PREGNANCY: "Is there any chance you are pregnant?" "When was your last menstrual period?"     No  Protocols used: HIGH BLOOD PRESSURE-A-AH

## 2018-11-08 ENCOUNTER — Emergency Department: Payer: Medicare Other

## 2018-11-08 ENCOUNTER — Observation Stay
Admission: EM | Admit: 2018-11-08 | Discharge: 2018-11-09 | Disposition: A | Discharge status: Home / Self Care | Payer: Medicare Other | Attending: Internal Medicine | Admitting: Internal Medicine

## 2018-11-08 ENCOUNTER — Observation Stay: Payer: Medicare Other

## 2018-11-08 ENCOUNTER — Other Ambulatory Visit: Payer: Self-pay

## 2018-11-08 DIAGNOSIS — R200 Anesthesia of skin: Secondary | ICD-10-CM | POA: Diagnosis not present

## 2018-11-08 DIAGNOSIS — I160 Hypertensive urgency: Secondary | ICD-10-CM

## 2018-11-08 DIAGNOSIS — Z87891 Personal history of nicotine dependence: Secondary | ICD-10-CM | POA: Diagnosis not present

## 2018-11-08 DIAGNOSIS — I10 Essential (primary) hypertension: Secondary | ICD-10-CM | POA: Diagnosis not present

## 2018-11-08 DIAGNOSIS — Z79899 Other long term (current) drug therapy: Secondary | ICD-10-CM | POA: Diagnosis not present

## 2018-11-08 DIAGNOSIS — Z885 Allergy status to narcotic agent status: Secondary | ICD-10-CM | POA: Insufficient documentation

## 2018-11-08 DIAGNOSIS — M353 Polymyalgia rheumatica: Secondary | ICD-10-CM | POA: Diagnosis not present

## 2018-11-08 DIAGNOSIS — Z85828 Personal history of other malignant neoplasm of skin: Secondary | ICD-10-CM | POA: Insufficient documentation

## 2018-11-08 DIAGNOSIS — Z888 Allergy status to other drugs, medicaments and biological substances status: Secondary | ICD-10-CM | POA: Diagnosis not present

## 2018-11-08 DIAGNOSIS — E039 Hypothyroidism, unspecified: Secondary | ICD-10-CM | POA: Diagnosis not present

## 2018-11-08 DIAGNOSIS — Z66 Do not resuscitate: Secondary | ICD-10-CM | POA: Diagnosis not present

## 2018-11-08 DIAGNOSIS — Z7982 Long term (current) use of aspirin: Secondary | ICD-10-CM | POA: Diagnosis not present

## 2018-11-08 DIAGNOSIS — Z79890 Hormone replacement therapy (postmenopausal): Secondary | ICD-10-CM | POA: Diagnosis not present

## 2018-11-08 DIAGNOSIS — M19012 Primary osteoarthritis, left shoulder: Secondary | ICD-10-CM | POA: Diagnosis not present

## 2018-11-08 DIAGNOSIS — Z96612 Presence of left artificial shoulder joint: Secondary | ICD-10-CM | POA: Insufficient documentation

## 2018-11-08 DIAGNOSIS — I150 Renovascular hypertension: Secondary | ICD-10-CM | POA: Diagnosis present

## 2018-11-08 DIAGNOSIS — K219 Gastro-esophageal reflux disease without esophagitis: Secondary | ICD-10-CM | POA: Insufficient documentation

## 2018-11-08 LAB — URINE DRUG SCREEN, QUALITATIVE (ARMC ONLY)
Amphetamines, Ur Screen: NOT DETECTED
Barbiturates, Ur Screen: NOT DETECTED
Benzodiazepine, Ur Scrn: POSITIVE — AB
Cannabinoid 50 Ng, Ur ~~LOC~~: NOT DETECTED
Cocaine Metabolite,Ur ~~LOC~~: NOT DETECTED
MDMA (Ecstasy)Ur Screen: NOT DETECTED
Methadone Scn, Ur: NOT DETECTED
Opiate, Ur Screen: NOT DETECTED
Phencyclidine (PCP) Ur S: NOT DETECTED
Tricyclic, Ur Screen: NOT DETECTED

## 2018-11-08 LAB — URINALYSIS, ROUTINE W REFLEX MICROSCOPIC
Bacteria, UA: NONE SEEN
Bilirubin Urine: NEGATIVE
Glucose, UA: NEGATIVE mg/dL
Hgb urine dipstick: NEGATIVE
Ketones, ur: NEGATIVE mg/dL
Nitrite: NEGATIVE
Protein, ur: NEGATIVE mg/dL
Specific Gravity, Urine: 1.009 (ref 1.005–1.030)
pH: 6 (ref 5.0–8.0)

## 2018-11-08 LAB — CBC
HCT: 34.1 % — ABNORMAL LOW (ref 36.0–46.0)
Hemoglobin: 10.5 g/dL — ABNORMAL LOW (ref 12.0–15.0)
MCH: 25.7 pg — ABNORMAL LOW (ref 26.0–34.0)
MCHC: 30.8 g/dL (ref 30.0–36.0)
MCV: 83.4 fL (ref 80.0–100.0)
Platelets: 192 10*3/uL (ref 150–400)
RBC: 4.09 MIL/uL (ref 3.87–5.11)
RDW: 15.9 % — ABNORMAL HIGH (ref 11.5–15.5)
WBC: 7.3 10*3/uL (ref 4.0–10.5)
nRBC: 0 % (ref 0.0–0.2)

## 2018-11-08 LAB — PROTIME-INR
INR: 1 (ref 0.8–1.2)
Prothrombin Time: 13 seconds (ref 11.4–15.2)

## 2018-11-08 LAB — DIFFERENTIAL
Abs Immature Granulocytes: 0.02 10*3/uL (ref 0.00–0.07)
Basophils Absolute: 0.1 10*3/uL (ref 0.0–0.1)
Basophils Relative: 1 %
Eosinophils Absolute: 0.1 10*3/uL (ref 0.0–0.5)
Eosinophils Relative: 1 %
Immature Granulocytes: 0 %
Lymphocytes Relative: 33 %
Lymphs Abs: 2.4 10*3/uL (ref 0.7–4.0)
Monocytes Absolute: 0.7 10*3/uL (ref 0.1–1.0)
Monocytes Relative: 9 %
Neutro Abs: 4 10*3/uL (ref 1.7–7.7)
Neutrophils Relative %: 56 %

## 2018-11-08 LAB — COMPREHENSIVE METABOLIC PANEL
ALT: 13 U/L (ref 0–44)
AST: 19 U/L (ref 15–41)
Albumin: 3.8 g/dL (ref 3.5–5.0)
Alkaline Phosphatase: 56 U/L (ref 38–126)
Anion gap: 11 (ref 5–15)
BUN: 16 mg/dL (ref 8–23)
CO2: 24 mmol/L (ref 22–32)
Calcium: 9 mg/dL (ref 8.9–10.3)
Chloride: 104 mmol/L (ref 98–111)
Creatinine, Ser: 0.91 mg/dL (ref 0.44–1.00)
GFR calc Af Amer: >60 mL/min (ref >60)
GFR calc non Af Amer: 57 mL/min — ABNORMAL LOW (ref >60)
Glucose, Bld: 101 mg/dL — ABNORMAL HIGH (ref 70–99)
Potassium: 3.8 mmol/L (ref 3.5–5.1)
Sodium: 139 mmol/L (ref 135–145)
Total Bilirubin: 0.4 mg/dL (ref 0.3–1.2)
Total Protein: 6.8 g/dL (ref 6.5–8.1)

## 2018-11-08 LAB — APTT: aPTT: 29 seconds (ref 24–36)

## 2018-11-08 LAB — TROPONIN I: Troponin I: <0.03 ng/mL (ref <0.03)

## 2018-11-08 MED ORDER — ACETAMINOPHEN 650 MG RE SUPP: 650.0000 mg | Freq: Four times a day (QID) | RECTAL | Status: DC | PRN

## 2018-11-08 MED ORDER — POLYETHYLENE GLYCOL 3350 17 G PO PACK: 17.0000 g | PACK | Freq: Every day | ORAL | Status: DC | PRN

## 2018-11-08 MED ORDER — SODIUM CHLORIDE 0.9% FLUSH
3.0000 mL | Freq: Two times a day (BID) | INTRAVENOUS | Status: DC
  Administered 2018-11-08: 3 mL via INTRAVENOUS

## 2018-11-08 MED ORDER — ONDANSETRON HCL 4 MG/2ML IJ SOLN: 4.0000 mg | Freq: Four times a day (QID) | INTRAMUSCULAR | Status: DC | PRN

## 2018-11-08 MED ORDER — ALBUTEROL SULFATE (2.5 MG/3ML) 0.083% IN NEBU: 2.5000 mg | INHALATION_SOLUTION | RESPIRATORY_TRACT | Status: DC | PRN

## 2018-11-08 MED ORDER — AMLODIPINE BESYLATE 10 MG PO TABS
10.0000 mg | ORAL_TABLET | Freq: Every day | ORAL | Status: DC
  Administered 2018-11-08 – 2018-11-09 (×2): 10 mg via ORAL
  Filled 2018-11-08: qty 1
  Filled 2018-11-08: qty 2

## 2018-11-08 MED ORDER — LEVOTHYROXINE SODIUM 88 MCG PO TABS
88.0000 ug | ORAL_TABLET | Freq: Every day | ORAL | Status: DC
  Administered 2018-11-09: 88 ug via ORAL
  Filled 2018-11-08 (×2): qty 1

## 2018-11-08 MED ORDER — AMLODIPINE BESYLATE 5 MG PO TABS: 5.0000 mg | ORAL_TABLET | Freq: Once | ORAL | Status: DC

## 2018-11-08 MED ORDER — ENOXAPARIN SODIUM 40 MG/0.4ML ~~LOC~~ SOLN
40.0000 mg | SUBCUTANEOUS | Status: DC
  Administered 2018-11-08: 40 mg via SUBCUTANEOUS
  Filled 2018-11-08: qty 0.4

## 2018-11-08 MED ORDER — HYDROCHLOROTHIAZIDE 12.5 MG PO CAPS
12.5000 mg | ORAL_CAPSULE | Freq: Every day | ORAL | Status: DC
  Filled 2018-11-08: qty 1

## 2018-11-08 MED ORDER — ONDANSETRON HCL 4 MG PO TABS: 4.0000 mg | ORAL_TABLET | Freq: Four times a day (QID) | ORAL | Status: DC | PRN

## 2018-11-08 MED ORDER — HYDRALAZINE HCL 20 MG/ML IJ SOLN: 10.0000 mg | Freq: Four times a day (QID) | INTRAMUSCULAR | Status: DC | PRN

## 2018-11-08 MED ORDER — ACETAMINOPHEN 325 MG PO TABS: 650.0000 mg | ORAL_TABLET | Freq: Four times a day (QID) | ORAL | Status: DC | PRN

## 2018-11-08 NOTE — ED Notes (Signed)
MRI on phone with pt now for screening questions.

## 2018-11-08 NOTE — Progress Notes (Signed)
Advance care planning  Purpose of Encounter Uncontrolled hypertension, left upper extremity numbness  Parties in Attendance Patient  Patients Decisional capacity Patient is alert and oriented.  Able to make medical decisions.  Her documented healthcare power of attorney is her granddaughter IV Fernande Boyden.  Has ACP documents in place but not here at the hospital.  Discussed with patient regarding her uncontrolled hypertension and restarting Norvasc.  Monitoring overnight.  Treatment plan and prognosis discussed.  We also discussed regarding MRI of the brain for further investigation of the left upper extremity numbness.  CODE STATUS discussed and patient wishes to be DO NOT RESUSCITATE and DO NOT INTUBATE.  Orders entered and CODE STATUS changed.  DNR/DNI  Time spent - 17 minutes

## 2018-11-08 NOTE — H&P (Signed)
[place] [facility] [place] NAME: Kathryn Wise    MR#:  [id number]  DATE OF BIRTH:  1930-02-23  DATE OF ADMISSION:  11/08/2018  PRIMARY CARE PHYSICIAN: [name], MD   REQUESTING/REFERRING PHYSICIAN: Dr. Quentin Cornwall  CHIEF COMPLAINT:   Chief Complaint  Patient presents with  . Numbness  . Hypertension    HISTORY OF PRESENT ILLNESS:  Kathryn Wise  is a 85 y.o. female with a known history of hypertension, hypothyroidism, polymyalgia rheumatica, recent changes in blood pressure medications presents to the emergency room due to uncontrolled blood pressure and left upper extremity numbness.  Patient tells me she has had polymyalgia rheumatica and surgery on the left arm.  But the symptoms of numbness of the left upper extremity lasted very briefly and resolved.  NIH score on arrival to emergency room was 0.  Here she was found to have systolic blood pressure up to 210.  Patient is being admitted for further control of blood pressure and work-up.  CT scan of the head was normal. Patient was on lisinopril in the past which is not controlling her blood pressure.  She was started on metoprolol.  She did not do well and this was switched to amlodipine.  Amlodipine controlled her blood pressure really well into 110-130.  But she had some jitteriness and tremors which she thought was amlodipine and this was switched to lisinopril. Patient tells me her jitteriness on amlodipine was more of timing than dose of the medication.  She is asking to be switched back to amlodipine.  PAST MEDICAL HISTORY:   Past Medical History:  Diagnosis Date  . Arthritis   . GERD (gastroesophageal reflux disease)   . Heart murmur    has had years and years  . History of shingles Dec 2013   treated with steroids , post op from shoulder surgery  . Hypertension   . Hypothyroidism   . Osteoarthritis of left shoulder 07/14/2012  . Polymyalgia rheumatica ([place])     PAST SURGICAL  HISTORY:   Past Surgical History:  Procedure Laterality Date  . ARTHOSCOPIC ROTAOR CUFF REPAIR     LEFT  10+  YEARS    . CATARACT EXTRACTION W/ INTRAOCULAR LENS IMPLANT     RIGHT EYE  . CATARACT EXTRACTION W/PHACO Left 03/28/2016   Procedure: CATARACT EXTRACTION PHACO AND INTRAOCULAR LENS PLACEMENT (IOC);  Surgeon: Leandrew Koyanagi, MD;  Location: [facility];  Service: Ophthalmology;  Laterality: Left;  TORIC  . FACELIFT    . FOOT SURGERY     rt foot   TUMOR REMOVED  . JOINT REPLACEMENT Left Dec 2013   shoulder  . LEFT HEART CATH AND CORONARY ANGIOGRAPHY N/A 06/12/2017   Procedure: LEFT HEART CATH AND CORONARY ANGIOGRAPHY;  Surgeon: Minna Merritts, MD;  Location: [facility];  Service: Cardiovascular;  Laterality: N/A;  . SKIN CANCER EXCISION    . TOTAL SHOULDER ARTHROPLASTY  07/14/2012   Procedure: TOTAL SHOULDER ARTHROPLASTY;  Surgeon: Johnny Bridge, MD;  Location: [facility];  Service: Orthopedics;  Laterality: Left;    SOCIAL HISTORY:   Social History   Tobacco Use  . Smoking status: Former Smoker    Types: Cigarettes    Last attempt to quit: 07/30/1972    Years since quitting: 46.3  . Smokeless tobacco: Never Used  . Tobacco comment: smoked for only a few months  Substance Use Topics  . Alcohol use: Yes    Comment: OCCAS WINE  FAMILY HISTORY:   Family History  Problem Relation Age of Onset  . Cancer Daughter        Breast  . Breast cancer Daughter 72  . Cancer Son     DRUG ALLERGIES:   Allergies  Allergen Reactions  . Alendronate Swelling    And nausea   . Metoprolol Other (See Comments)    lethargy  . Oxycontin [Oxycodone Hcl]     Altered mental status    REVIEW OF SYSTEMS:   Review of Systems  Constitutional: Positive for malaise/fatigue. Negative for chills and fever.  HENT: Negative for sore throat.   Eyes: Negative for blurred vision, double vision and pain.  Respiratory: Negative for cough, hemoptysis, shortness of  breath and wheezing.   Cardiovascular: Negative for chest pain, palpitations, orthopnea and leg swelling.  Gastrointestinal: Negative for abdominal pain, constipation, diarrhea, heartburn, nausea and vomiting.  Genitourinary: Negative for dysuria and hematuria.  Musculoskeletal: Negative for back pain and joint pain.  Skin: Negative for rash.  Neurological: Positive for sensory change. Negative for speech change, focal weakness and headaches.  Endo/Heme/Allergies: Does not bruise/bleed easily.  Psychiatric/Behavioral: Negative for depression. The patient is not nervous/anxious.     MEDICATIONS AT HOME:   Prior to Admission medications   Medication Sig Start Date End Date Taking? Authorizing Provider  aspirin 81 MG tablet Take 81 mg by mouth daily.   Yes [provider]  Cholecalciferol (VITAMIN D-3) 1000 units CAPS Take 1 capsule by mouth daily.   Yes [provider]  Cyanocobalamin (VITAMIN B 12) 100 MCG LOZG Take 1 tablet by mouth daily.    Yes [provider]  levothyroxine (SYNTHROID, LEVOTHROID) 88 MCG tablet TAKE 1 TABLET BY MOUTH ONCE DAILY ON AN EMPTY STOMACH. WAIT 30 MINUTES BEFORE TAKING OTHER MEDS. 10/30/18  Yes [name], MD  lisinopril (PRINIVIL,ZESTRIL) 20 MG tablet Take 1 tablet (20 mg total) by mouth daily. 10/30/18  Yes [name], MD  nitroGLYCERIN (NITROSTAT) 0.4 MG SL tablet Place 1 tablet (0.4 mg total) every 5 (five) minutes as needed under the tongue for chest pain. 06/12/17  Yes Demetrios Loll, MD  POTASSIUM PO Take 1 capsule by mouth daily.   Yes [provider]  RESTASIS 0.05 % ophthalmic emulsion Place 1 drop into both eyes 2 (two) times daily.  10/22/15  Yes [provider]     VITAL SIGNS:  Blood pressure (!) 172/81, pulse 78, temperature 98.9 F (37.2 C), temperature source Oral, resp. rate 15, height 5\' 6"  (1.676 m), weight 68 kg, SpO2 98 %.  PHYSICAL EXAMINATION:  Physical Exam  GENERAL:  85 y.o.-year-old  patient lying in the bed with no acute distress.  EYES: Pupils equal, round, reactive to light and accommodation. No scleral icterus. Extraocular muscles intact.  HEENT: Head atraumatic, normocephalic. Oropharynx and nasopharynx clear. No oropharyngeal erythema, moist oral mucosa  NECK:  Supple, no jugular venous distention. No thyroid enlargement, no tenderness.  LUNGS: Normal breath sounds bilaterally, no wheezing, rales, rhonchi. No use of accessory muscles of respiration.  CARDIOVASCULAR: S1, S2 normal. No murmurs, rubs, or gallops.  ABDOMEN: Soft, nontender, nondistended. Bowel sounds present. No organomegaly or mass.  EXTREMITIES: No pedal edema, cyanosis, or clubbing. + 2 pedal & radial pulses b/l.   NEUROLOGIC: Cranial nerves II through XII are intact. No focal Motor or sensory deficits appreciated b/l PSYCHIATRIC: The patient is alert and oriented x 3. Good affect.  SKIN: No obvious rash, lesion, or ulcer.  LABORATORY PANEL:   CBC Recent Labs  Lab 11/08/18 1610  WBC 7.3  HGB 10.5*  HCT 34.1*  PLT 192   ------------------------------------------------------------------------------------------------------------------  Chemistries  Recent Labs  Lab 11/08/18 1610  NA 139  K 3.8  CL 104  CO2 24  GLUCOSE 101*  BUN 16  CREATININE 0.91  CALCIUM 9.0  AST 19  ALT 13  ALKPHOS 56  BILITOT 0.4   ------------------------------------------------------------------------------------------------------------------  Cardiac Enzymes Recent Labs  Lab 11/08/18 1610  TROPONINI <0.03   ------------------------------------------------------------------------------------------------------------------  RADIOLOGY:  Ct Head Wo Contrast  Result Date: 11/08/2018 CLINICAL DATA:  Left arm numbness. EXAM: CT HEAD WITHOUT CONTRAST TECHNIQUE: Contiguous axial images were obtained from the base of the skull through the vertex without intravenous contrast. COMPARISON:  10/17/2018 FINDINGS:  Brain: There is no evidence of acute infarct, intracranial hemorrhage, mass, midline shift, or extra-axial fluid collection. Mild cerebral atrophy is within normal limits for age. Cerebral white matter hypodensities are similar to the prior study and nonspecific but compatible with minimal chronic small vessel ischemic disease. Vascular: Mild calcified atherosclerosis at the skull base. No hyperdense vessel. Skull: No fracture or focal osseous lesion. Sinuses/Orbits: Visualized paranasal sinuses and mastoid air cells are clear. Visualized orbits are unremarkable. Other: None. IMPRESSION: No evidence of acute intracranial abnormality. Electronically Signed   By: Logan Bores M.D.   On: 11/08/2018 17:19   Dg Chest Portable 1 View  Result Date: 11/08/2018 CLINICAL DATA:  Hypertension EXAM: PORTABLE CHEST 1 VIEW COMPARISON:  Chest x-ray dated 06/10/2017. FINDINGS: Heart size and mediastinal contours are within normal limits. Lungs are clear. No pleural effusion or pneumothorax seen. No acute appearing osseous abnormality. LEFT shoulder arthroplasty. IMPRESSION: No active disease. No evidence of pneumonia or pulmonary edema. Electronically Signed   By: Franki Cabot M.D.   On: 11/08/2018 16:42     IMPRESSION AND PLAN:   *Uncontrolled hypertension.  Recent change in medications from Norvasc to lisinopril.  Blood pressure has been uncontrolled.  Patient tells me that her blood pressure was well controlled on amlodipine but due to feeling jittery this was switched.  She thinks it is an issue with timing and wants to get back on amlodipine.  I did offer to add hydrochlorothiazide to lisinopril but patient wants to stick to amlodipine.  Restart amlodipine 10 mg.  Added IV hydralazine PRN.  *Mild left upper extremity numbness.  Patient is unclear about this and quickly resolved.  Likely due to uncontrolled hypertension.  CT scan of the head was normal.  Will check MRI of the brain.  If positive will need further  work-up for stroke.  At this time patient's NIH score is 0.  *Hypothyroidism.  Continue levothyroxine from home.  DVT prophylaxis with Lovenox  All the records are reviewed and case discussed with ED provider. Management plans discussed with the patient, family and they are in agreement.  CODE STATUS: FULL CODE  TOTAL TIME TAKING CARE OF THIS PATIENT: 35 minutes.   Neita Carp M.D on 11/08/2018 at 6:54 PM  Between 7am to 6pm - Pager - [phone]  After 6pm go to [email or web address] - password [email or web address] [facility]  Office  [phone]  CC: Primary care physician; [name], MD  Note: This dictation was prepared with Dragon dictation along with smaller phrase technology. Any transcriptional errors that result from this process are unintentional.

## 2018-11-08 NOTE — ED Notes (Signed)
Pt back from MRI 

## 2018-11-08 NOTE — ED Triage Notes (Signed)
Pt arrives to ED c/o of HTN. States her cuff "won't get a reading because it's so high." states she took 2 times her BP meds yesterday. States BP meds changed 3/9 by PCP. C/o of L arm numbness since lunch today. States ate lunch at noon. Speech clear. Moving arms on own.

## 2018-11-08 NOTE — ED Notes (Signed)
Pt leaving for MRI.  

## 2018-11-08 NOTE — ED Notes (Signed)
Pt given more warm blankets.  

## 2018-11-08 NOTE — ED Notes (Signed)
Pt assisted to/from bed/toilet. Currently steady. Urine sample sent to lab.

## 2018-11-08 NOTE — ED Notes (Signed)
EDP Robinson at bedside, aware of BP.

## 2018-11-08 NOTE — ED Provider Notes (Signed)
[facility] Emergency Department Provider Note    First MD Initiated Contact with Patient 11/08/18 1559     (approximate)  I have reviewed the triage vital signs and the nursing notes.   HISTORY  Chief Complaint Numbness and Hypertension    HPI Kathryn Wise is a 85 y.o. female presents the ER for evaluation of elevated blood pressure and heaviness sensation in her left hand.  Denies any pain.  No weakness.  States symptoms started roughly around noon.  States that she is had multiple changes to her blood pressure medications over the past several days.  Denies any history of CVA or TIA.  Does not smoke.  No history of diabetes high cholesterol.  States her symptoms the most part were worse right around lunch stating that she is having intermittent tremor and having difficulty controlling her left arm but that has since resolved.  She now feels like she is just got some heaviness in that left arm but does have sensation.   Echo 2018: Study Conclusions  - Left ventricle: The cavity size was normal. Near cavity   obliteration in systole. There was mild focal basal and mild   concentric hypertrophy of the septum. Systolic function was   normal. The estimated ejection fraction was in the range of 55%   to 60%. Wall motion was normal; there were no regional wall   motion abnormalities. Doppler parameters are consistent with   abnormal left ventricular relaxation (grade 1 diastolic   dysfunction). - Aortic valve: Transvalvular velocity was increased. There was   very mild stenosis. - Mitral valve: Calcified annulus. There was moderate   regurgitation. - Left atrium: The atrium was mildly dilated. - Right ventricle: Systolic function was normal. - Pulmonary arteries: Systolic pressure was moderately elevated. PA   peak pressure: 50 mm Hg (S).   Past Medical History:  Diagnosis Date  . Arthritis   . GERD (gastroesophageal reflux disease)   . Heart  murmur    has had years and years  . History of shingles Dec 2013   treated with steroids , post op from shoulder surgery  . Hypertension   . Hypothyroidism   . Osteoarthritis of left shoulder 07/14/2012  . Polymyalgia rheumatica (HCC)    Family History  Problem Relation Age of Onset  . Cancer Daughter        Breast  . Breast cancer Daughter 67  . Cancer Son    Past Surgical History:  Procedure Laterality Date  . ARTHOSCOPIC ROTAOR CUFF REPAIR     LEFT  10+  YEARS    . CATARACT EXTRACTION W/ INTRAOCULAR LENS IMPLANT     RIGHT EYE  . CATARACT EXTRACTION W/PHACO Left 03/28/2016   Procedure: CATARACT EXTRACTION PHACO AND INTRAOCULAR LENS PLACEMENT (IOC);  Surgeon: Leandrew Koyanagi, MD;  Location: [facility];  Service: Ophthalmology;  Laterality: Left;  TORIC  . FACELIFT    . FOOT SURGERY     rt foot   TUMOR REMOVED  . JOINT REPLACEMENT Left Dec 2013   shoulder  . LEFT HEART CATH AND CORONARY ANGIOGRAPHY N/A 06/12/2017   Procedure: LEFT HEART CATH AND CORONARY ANGIOGRAPHY;  Surgeon: Minna Merritts, MD;  Location: [place] [facility];  Service: Cardiovascular;  Laterality: N/A;  . SKIN CANCER EXCISION    . TOTAL SHOULDER ARTHROPLASTY  07/14/2012   Procedure: TOTAL SHOULDER ARTHROPLASTY;  Surgeon: Johnny Bridge, MD;  Location: [place];  Service: Orthopedics;  Laterality: Left;  Patient Active Problem List   Diagnosis Date Noted  . Tinnitus aurium, bilateral 10/19/2018  . Blurred vision, bilateral 10/19/2018  . Insomnia due to psychological stress 10/07/2018  . Food intolerance in adult 10/07/2018  . Skin lesion of hand 10/07/2018  . Myalgia due to statin 12/10/2017  . Hyperlipidemia LDL goal <130 10/03/2017  . Hiatal hernia 07/03/2017  . Coronary artery disease 07/03/2017  . Hospital discharge follow-up 07/03/2017  . Chest pain 06/10/2017  . Olecranon fracture, left, closed, with routine healing, subsequent encounter 09/20/2016  . Pain in left wrist  09/20/2016  . Breast cancer screening by mammogram 09/20/2016  . Major depressive disorder, single episode 11/05/2015  . Exertional dyspnea 11/05/2015  . Hypothyroidism 04/28/2015  . Hypertension 01/13/2014  . Osteopenia 10/25/2013  . GERD (gastroesophageal reflux disease) 09/11/2013  . Encounter for preventive health examination 09/11/2013  . Dysphagia, pharyngoesophageal phase 09/10/2013  . Heart murmur 11/28/2012  . History of shingles   . Polymyalgia rheumatica ([place]) 11/18/2012  . Osteoarthritis of left shoulder 07/14/2012      Prior to Admission medications   Medication Sig Start Date End Date Taking? Authorizing Provider  aspirin 81 MG tablet Take 81 mg by mouth daily.   Yes [provider]  Cholecalciferol (VITAMIN D-3) 1000 units CAPS Take 1 capsule by mouth daily.   Yes [provider]  Cyanocobalamin (VITAMIN B 12) 100 MCG LOZG Take 1 tablet by mouth daily.    Yes [provider]  levothyroxine (SYNTHROID, LEVOTHROID) 88 MCG tablet TAKE 1 TABLET BY MOUTH ONCE DAILY ON AN EMPTY STOMACH. WAIT 30 MINUTES BEFORE TAKING OTHER MEDS. 10/30/18  Yes [name], MD  lisinopril (PRINIVIL,ZESTRIL) 20 MG tablet Take 1 tablet (20 mg total) by mouth daily. 10/30/18  Yes [name], MD  nitroGLYCERIN (NITROSTAT) 0.4 MG SL tablet Place 1 tablet (0.4 mg total) every 5 (five) minutes as needed under the tongue for chest pain. 06/12/17  Yes Demetrios Loll, MD  POTASSIUM PO Take 1 capsule by mouth daily.   Yes [provider]  RESTASIS 0.05 % ophthalmic emulsion Place 1 drop into both eyes 2 (two) times daily.  10/22/15  Yes [provider]    Allergies Alendronate; Metoprolol; and Oxycontin [oxycodone hcl]    Social History Social History   Tobacco Use  . Smoking status: Former Smoker    Types: Cigarettes    Last attempt to quit: 07/30/1972    Years since quitting: 46.3  . Smokeless tobacco: Never Used  . Tobacco comment: smoked for only a  few months  Substance Use Topics  . Alcohol use: Yes    Comment: OCCAS WINE   . Drug use: No    Review of Systems Patient denies headaches, rhinorrhea, blurry vision, numbness, shortness of breath, chest pain, edema, cough, abdominal pain, nausea, vomiting, diarrhea, dysuria, fevers, rashes or hallucinations unless otherwise stated above in HPI. ____________________________________________   PHYSICAL EXAM:  VITAL SIGNS: Vitals:   11/08/18 1748 11/08/18 1800  BP: (!) 205/79 (!) 186/86  Pulse: 79 78  Resp: 16 19  Temp:    SpO2: 99% 98%    Constitutional: Alert and oriented.  Eyes: Conjunctivae are normal.  Head: Atraumatic. Nose: No congestion/rhinnorhea. Mouth/Throat: Mucous membranes are moist.   Neck: No stridor. Painless ROM.  Cardiovascular: Normal rate, regular rhythm. Grossly normal heart sounds.  Good peripheral circulation in all four extremities Respiratory: Normal respiratory effort.  No retractions. Lungs CTAB. Gastrointestinal: Soft and nontender. No distention. No abdominal  bruits. No CVA tenderness. Genitourinary:  Musculoskeletal: No lower extremity tenderness nor edema.  No joint effusions. Neurologic:  CN- intact.  No facial droop, Normal FNF.  Normal heel to shin.  No drift. Sensation intact bilaterally. Normal speech and language. No gross focal neurologic deficits are appreciated. No gait instability. NIH 0 Skin:  Skin is warm, dry and intact. No rash noted. Psychiatric: Mood and affect are normal. Speech and behavior are normal.  ____________________________________________   LABS (all labs ordered are listed, but only abnormal results are displayed)  Results for orders placed or performed during the hospital encounter of 11/08/18 (from the past 24 hour(s))  Protime-INR     Status: None   Collection Time: 11/08/18  4:10 PM  Result Value Ref Range   Prothrombin Time 13.0 11.4 - 15.2 seconds   INR 1.0 0.8 - 1.2  APTT     Status: None   Collection  Time: 11/08/18  4:10 PM  Result Value Ref Range   aPTT 29 24 - 36 seconds  CBC     Status: Abnormal   Collection Time: 11/08/18  4:10 PM  Result Value Ref Range   WBC 7.3 4.0 - 10.5 K/uL   RBC 4.09 3.87 - 5.11 MIL/uL   Hemoglobin 10.5 (L) 12.0 - 15.0 g/dL   HCT 34.1 (L) 36.0 - 46.0 %   MCV 83.4 80.0 - 100.0 fL   MCH 25.7 (L) 26.0 - 34.0 pg   MCHC 30.8 30.0 - 36.0 g/dL   RDW 15.9 (H) 11.5 - 15.5 %   Platelets 192 150 - 400 K/uL   nRBC 0.0 0.0 - 0.2 %  Differential     Status: None   Collection Time: 11/08/18  4:10 PM  Result Value Ref Range   Neutrophils Relative % 56 %   Neutro Abs 4.0 1.7 - 7.7 K/uL   Lymphocytes Relative 33 %   Lymphs Abs 2.4 0.7 - 4.0 K/uL   Monocytes Relative 9 %   Monocytes Absolute 0.7 0.1 - 1.0 K/uL   Eosinophils Relative 1 %   Eosinophils Absolute 0.1 0.0 - 0.5 K/uL   Basophils Relative 1 %   Basophils Absolute 0.1 0.0 - 0.1 K/uL   Immature Granulocytes 0 %   Abs Immature Granulocytes 0.02 0.00 - 0.07 K/uL  Comprehensive metabolic panel     Status: Abnormal   Collection Time: 11/08/18  4:10 PM  Result Value Ref Range   Sodium 139 135 - 145 mmol/L   Potassium 3.8 3.5 - 5.1 mmol/L   Chloride 104 98 - 111 mmol/L   CO2 24 22 - 32 mmol/L   Glucose, Bld 101 (H) 70 - 99 mg/dL   BUN 16 8 - 23 mg/dL   Creatinine, Ser 0.91 0.44 - 1.00 mg/dL   Calcium 9.0 8.9 - 10.3 mg/dL   Total Protein 6.8 6.5 - 8.1 g/dL   Albumin 3.8 3.5 - 5.0 g/dL   AST 19 15 - 41 U/L   ALT 13 0 - 44 U/L   Alkaline Phosphatase 56 38 - 126 U/L   Total Bilirubin 0.4 0.3 - 1.2 mg/dL   GFR calc non Af Amer 57 (L) >60 mL/min   GFR calc Af Amer >60 >60 mL/min   Anion gap 11 5 - 15  Troponin I - ONCE - STAT     Status: None   Collection Time: 11/08/18  4:10 PM  Result Value Ref Range   Troponin I <0.03 <0.03 ng/mL  Urinalysis, Routine  w reflex microscopic     Status: Abnormal   Collection Time: 11/08/18  5:48 PM  Result Value Ref Range   Color, Urine STRAW (A) YELLOW   APPearance  CLEAR (A) CLEAR   Specific Gravity, Urine 1.009 1.005 - 1.030   pH 6.0 5.0 - 8.0   Glucose, UA NEGATIVE NEGATIVE mg/dL   Hgb urine dipstick NEGATIVE NEGATIVE   Bilirubin Urine NEGATIVE NEGATIVE   Ketones, ur NEGATIVE NEGATIVE mg/dL   Protein, ur NEGATIVE NEGATIVE mg/dL   Nitrite NEGATIVE NEGATIVE   Leukocytes,Ua TRACE (A) NEGATIVE   RBC / HPF 0-5 0 - 5 RBC/hpf   WBC, UA 6-10 0 - 5 WBC/hpf   Bacteria, UA NONE SEEN NONE SEEN   Squamous Epithelial / LPF 0-5 0 - 5   ____________________________________________  EKG My review and personal interpretation at Time: 16:19   Indication: htn  Rate: 70  Rhythm: sinus Axis: normal Other: new t wave inversion in precordial leads as compared to previous ____________________________________________  RADIOLOGY  I personally reviewed all radiographic images ordered to evaluate for the above acute complaints and reviewed radiology reports and findings.  These findings were personally discussed with the patient.  Please see medical record for radiology report.  ____________________________________________   PROCEDURES  Procedure(s) performed:  Procedures    Critical Care performed: no ____________________________________________   INITIAL IMPRESSION / ASSESSMENT AND PLAN / ED COURSE  Pertinent labs & imaging results that were available during my care of the patient were reviewed by me and considered in my medical decision making (see chart for details).   DDX: cva, tia, hypoglycemia, dehydration, htn, electrolyte abnormality, dissection, sepsis   Kathryn Wise is a 85 y.o. who presents to the ED with was as described above.  She is afebrile but hypertensive.  Exam as above.  Currently has an NIH of 0 therefore will not call code stroke but do feel the patient would benefit from evaluation due to concern for hypertensive urgency possible TIA.  The patient will be placed on continuous pulse oximetry and telemetry for monitoring.   Laboratory evaluation will be sent to evaluate for the above complaints.     Clinical Course as of Nov 07 1805  Sat Nov 08, 2018  1756 Patient with persistently elevated blood pressures.  Will restart amlodipine which seem to be controlling her blood pressure better.  Blood work thus far is reassuring.  Troponin negative but does have new EKG changes and given her report of weakness in her left upper extremity I am concerned for hypertensive urgency and do feel patient would benefit from hospitalization for further medical management.   [PR]    Clinical Course User Index [PR] Merlyn Lot, MD     As part of my medical decision making, I reviewed the following data within the [facility] notes reviewed and incorporated, Labs reviewed, notes from prior ED visits and [place] Controlled Substance Database   ____________________________________________   FINAL CLINICAL IMPRESSION(S) / ED DIAGNOSES  Final diagnoses:  Hypertensive urgency      NEW MEDICATIONS STARTED DURING THIS VISIT:  New Prescriptions   No medications on file     Note:  This document was prepared using Dragon voice recognition software and may include unintentional dictation errors.    Merlyn Lot, MD 11/08/18 1807

## 2018-11-08 NOTE — ED Notes (Addendum)
ED TO INPATIENT HANDOFF REPORT  ED Nurse Name and Phone #: Metta Clines [phone]  S Name/Age/Gender Kathryn Wise 85 y.o. female Room/Bed: ED05A/ED05A  Code Status   Code Status: Prior  Home/SNF/Other Home Patient oriented to: self, place, time and situation Is this baseline? Yes   Triage Complete: Triage complete  Chief Complaint Numbness/BP High  Triage Note Pt arrives to ED c/o of HTN. States her cuff "won't get a reading because it's so high." states she took 2 times her BP meds yesterday. States BP meds changed 3/9 by PCP. C/o of L arm numbness since lunch today. States ate lunch at noon. Speech clear. Moving arms on own.    Allergies Allergies  Allergen Reactions  . Alendronate Swelling    And nausea   . Metoprolol Other (See Comments)    lethargy  . Oxycontin [Oxycodone Hcl]     Altered mental status    Level of Care/Admitting Diagnosis ED Disposition    None      B Medical/Surgery History Past Medical History:  Diagnosis Date  . Arthritis   . GERD (gastroesophageal reflux disease)   . Heart murmur    has had years and years  . History of shingles Dec 2013   treated with steroids , post op from shoulder surgery  . Hypertension   . Hypothyroidism   . Osteoarthritis of left shoulder 07/14/2012  . Polymyalgia rheumatica (HCC)    Past Surgical History:  Procedure Laterality Date  . ARTHOSCOPIC ROTAOR CUFF REPAIR     LEFT  10+  YEARS    . CATARACT EXTRACTION W/ INTRAOCULAR LENS IMPLANT     RIGHT EYE  . CATARACT EXTRACTION W/PHACO Left 03/28/2016   Procedure: CATARACT EXTRACTION PHACO AND INTRAOCULAR LENS PLACEMENT (IOC);  Surgeon: Leandrew Koyanagi, MD;  Location: [facility];  Service: Ophthalmology;  Laterality: Left;  TORIC  . FACELIFT    . FOOT SURGERY     rt foot   TUMOR REMOVED  . JOINT REPLACEMENT Left Dec 2013   shoulder  . LEFT HEART CATH AND CORONARY ANGIOGRAPHY N/A 06/12/2017   Procedure: LEFT HEART CATH AND CORONARY  ANGIOGRAPHY;  Surgeon: Minna Merritts, MD;  Location: [facility];  Service: Cardiovascular;  Laterality: N/A;  . SKIN CANCER EXCISION    . TOTAL SHOULDER ARTHROPLASTY  07/14/2012   Procedure: TOTAL SHOULDER ARTHROPLASTY;  Surgeon: Johnny Bridge, MD;  Location: [facility];  Service: Orthopedics;  Laterality: Left;     A IV Location/Drains/Wounds Patient Lines/Drains/Airways Status   Active Line/Drains/Airways    Name:   Placement date:   Placement time:   Site:   Days:   Incision 07/14/12 Shoulder Left   07/14/12    1654     2308   Incision (Closed) 03/28/16 Eye Bilateral   03/28/16    1014     955          Intake/Output Last 24 hours No intake or output data in the 24 hours ending 11/08/18 1758  Labs/Imaging Results for orders placed or performed during the hospital encounter of 11/08/18 (from the past 48 hour(s))  Protime-INR     Status: None   Collection Time: 11/08/18  4:10 PM  Result Value Ref Range   Prothrombin Time 13.0 11.4 - 15.2 seconds   INR 1.0 0.8 - 1.2    Comment: (NOTE) INR goal varies based on device and disease states. Performed at [facility], [address], [place], [place] [address]  APTT     Status: None   Collection Time: 11/08/18  4:10 PM  Result Value Ref Range   aPTT 29 24 - 36 seconds    Comment: Performed at [facility], [address] [place], [place] [address]  CBC     Status: Abnormal   Collection Time: 11/08/18  4:10 PM  Result Value Ref Range   WBC 7.3 4.0 - 10.5 K/uL   RBC 4.09 3.87 - 5.11 MIL/uL   Hemoglobin 10.5 (L) 12.0 - 15.0 g/dL   HCT 34.1 (L) 36.0 - 46.0 %   MCV 83.4 80.0 - 100.0 fL   MCH 25.7 (L) 26.0 - 34.0 pg   MCHC 30.8 30.0 - 36.0 g/dL   RDW 15.9 (H) 11.5 - 15.5 %   Platelets 192 150 - 400 K/uL   nRBC 0.0 0.0 - 0.2 %    Comment: Performed at [facility], [address] [place], [place] [address]  Differential     Status: None   Collection Time: 11/08/18  4:10 PM  Result  Value Ref Range   Neutrophils Relative % 56 %   Neutro Abs 4.0 1.7 - 7.7 K/uL   Lymphocytes Relative 33 %   Lymphs Abs 2.4 0.7 - 4.0 K/uL   Monocytes Relative 9 %   Monocytes Absolute 0.7 0.1 - 1.0 K/uL   Eosinophils Relative 1 %   Eosinophils Absolute 0.1 0.0 - 0.5 K/uL   Basophils Relative 1 %   Basophils Absolute 0.1 0.0 - 0.1 K/uL   Immature Granulocytes 0 %   Abs Immature Granulocytes 0.02 0.00 - 0.07 K/uL    Comment: Performed at [facility], [address] [place], [place] [address]  Comprehensive metabolic panel     Status: Abnormal   Collection Time: 11/08/18  4:10 PM  Result Value Ref Range   Sodium 139 135 - 145 mmol/L   Potassium 3.8 3.5 - 5.1 mmol/L   Chloride 104 98 - 111 mmol/L   CO2 24 22 - 32 mmol/L   Glucose, Bld 101 (H) 70 - 99 mg/dL   BUN 16 8 - 23 mg/dL   Creatinine, Ser 0.91 0.44 - 1.00 mg/dL   Calcium 9.0 8.9 - 10.3 mg/dL   Total Protein 6.8 6.5 - 8.1 g/dL   Albumin 3.8 3.5 - 5.0 g/dL   AST 19 15 - 41 U/L   ALT 13 0 - 44 U/L   Alkaline Phosphatase 56 38 - 126 U/L   Total Bilirubin 0.4 0.3 - 1.2 mg/dL   GFR calc non Af Amer 57 (L) >60 mL/min   GFR calc Af Amer >60 >60 mL/min   Anion gap 11 5 - 15    Comment: Performed at [facility], [address] [place], [place] [address]  Troponin I - ONCE - STAT     Status: None   Collection Time: 11/08/18  4:10 PM  Result Value Ref Range   Troponin I <0.03 <0.03 ng/mL    Comment: Performed at [facility], [address] [place], [place] [address]   Ct Head Wo Contrast  Result Date: 11/08/2018 CLINICAL DATA:  Left arm numbness. EXAM: CT HEAD WITHOUT CONTRAST TECHNIQUE: Contiguous axial images were obtained from the base of the skull through the vertex without intravenous contrast. COMPARISON:  10/17/2018 FINDINGS: Brain: There is no evidence of acute infarct, intracranial hemorrhage, mass, midline shift, or extra-axial fluid collection. Mild cerebral atrophy is within normal  limits for age. Cerebral  white matter hypodensities are similar to the prior study and nonspecific but compatible with minimal chronic small vessel ischemic disease. Vascular: Mild calcified atherosclerosis at the skull base. No hyperdense vessel. Skull: No fracture or focal osseous lesion. Sinuses/Orbits: Visualized paranasal sinuses and mastoid air cells are clear. Visualized orbits are unremarkable. Other: None. IMPRESSION: No evidence of acute intracranial abnormality. Electronically Signed   By: Logan Bores M.D.   On: 11/08/2018 17:19   Dg Chest Portable 1 View  Result Date: 11/08/2018 CLINICAL DATA:  Hypertension EXAM: PORTABLE CHEST 1 VIEW COMPARISON:  Chest x-ray dated 06/10/2017. FINDINGS: Heart size and mediastinal contours are within normal limits. Lungs are clear. No pleural effusion or pneumothorax seen. No acute appearing osseous abnormality. LEFT shoulder arthroplasty. IMPRESSION: No active disease. No evidence of pneumonia or pulmonary edema. Electronically Signed   By: Franki Cabot M.D.   On: 11/08/2018 16:42    Pending Labs Unresulted Labs (From admission, onward)    Start     Ordered   11/08/18 1609  Urine Drug Screen, Qualitative  Once,   STAT     11/08/18 1608   11/08/18 1609  Urinalysis, Routine w reflex microscopic  ONCE - STAT,   STAT     11/08/18 1608          Vitals/Pain Today's Vitals   11/08/18 1700 11/08/18 1730 11/08/18 1736 11/08/18 1748  BP: (!) 153/68 (!) 186/77 (!) 193/74 (!) 205/79  Pulse: 61 68 79 79  Resp: 16 16 16 16   Temp:      TempSrc:      SpO2: 100% 98% 97% 99%  Weight:      Height:        Isolation Precautions No active isolations  Medications Medications - No data to display  Mobility walks Low fall risk   Focused Assessments Neuro Assessment Handoff:  Swallow screen pass? Yes    NIH Stroke Scale ( + Modified Stroke Scale Criteria)  Interval: Initial Level of Consciousness (1a.)   : Alert, keenly responsive LOC Questions  (1b. )   +: Answers both questions correctly LOC Commands (1c. )   + : Performs both tasks correctly Best Gaze (2. )  +: Normal Visual (3. )  +: No visual loss Facial Palsy (4. )    : Normal symmetrical movements Motor Arm, Left (5a. )   +: No drift Motor Arm, Right (5b. )   +: No drift Motor Leg, Left (6a. )   +: No drift Motor Leg, Right (6b. )   +: No drift Limb Ataxia (7. ): Absent Sensory (8. )   +: Normal, no sensory loss Best Language (9. )   +: No aphasia Dysarthria (10. ): Normal Extinction/Inattention (11.)   +: No Abnormality Modified SS Total  +: 0 Complete NIHSS TOTAL: 0     Neuro Assessment: Exceptions to [facility] Neuro Checks:   Initial (11/08/18 1700)  Last Documented NIHSS Modified Score: 0 (11/08/18 1700) Has TPA been given? No If patient is a Neuro Trauma and patient is going to OR before floor call report to [place] nurse: [phone] or [phone]  ,    R Recommendations: See Admitting Provider Note  Report given to:   Additional Notes:  Pt not currently having L arm numbness, steady when walking, NIH: 0, BP remains high, denies HA, A&Ox4, RA, CT neg, passed swallow screen, at MRI now.

## 2018-11-08 NOTE — ED Notes (Signed)
Admitting MD at bedside talking with pt.

## 2018-11-08 NOTE — ED Notes (Signed)
2C charge RN will call this RN back momentarily.

## 2018-11-09 DIAGNOSIS — E039 Hypothyroidism, unspecified: Secondary | ICD-10-CM | POA: Diagnosis not present

## 2018-11-09 DIAGNOSIS — I10 Essential (primary) hypertension: Secondary | ICD-10-CM | POA: Diagnosis not present

## 2018-11-09 LAB — BASIC METABOLIC PANEL
Anion gap: 10 (ref 5–15)
BUN: 13 mg/dL (ref 8–23)
CO2: 24 mmol/L (ref 22–32)
Calcium: 8.8 mg/dL — ABNORMAL LOW (ref 8.9–10.3)
Chloride: 106 mmol/L (ref 98–111)
Creatinine, Ser: 0.81 mg/dL (ref 0.44–1.00)
GFR calc Af Amer: >60 mL/min (ref >60)
GFR calc non Af Amer: >60 mL/min (ref >60)
Glucose, Bld: 91 mg/dL (ref 70–99)
Potassium: 4 mmol/L (ref 3.5–5.1)
Sodium: 140 mmol/L (ref 135–145)

## 2018-11-09 LAB — CBC
HCT: 35.6 % — ABNORMAL LOW (ref 36.0–46.0)
Hemoglobin: 10.8 g/dL — ABNORMAL LOW (ref 12.0–15.0)
MCH: 25.4 pg — ABNORMAL LOW (ref 26.0–34.0)
MCHC: 30.3 g/dL (ref 30.0–36.0)
MCV: 83.8 fL (ref 80.0–100.0)
Platelets: 205 10*3/uL (ref 150–400)
RBC: 4.25 MIL/uL (ref 3.87–5.11)
RDW: 15.9 % — ABNORMAL HIGH (ref 11.5–15.5)
WBC: 8.5 10*3/uL (ref 4.0–10.5)
nRBC: 0 % (ref 0.0–0.2)

## 2018-11-09 MED ORDER — AMLODIPINE BESYLATE 10 MG PO TABS: 10.0000 mg | ORAL_TABLET | Freq: Every day | ORAL | 0 refills | Status: DC

## 2018-11-09 NOTE — Progress Notes (Signed)
Pt discharged per MD order. IV removed. Discharge instructions reviewed with pt. Pt verbalized understanding. Pt taken downstairs in wheelchair by staff.  ?

## 2018-11-09 NOTE — Discharge Summary (Signed)
[facility] [place] NAME: Kathryn Wise    MR#:  [id number]  DATE OF BIRTH:  Jan 26, 1930  DATE OF ADMISSION:  11/08/2018   ADMITTING PHYSICIAN: Hillary Bow, MD  DATE OF DISCHARGE: 11/09/2018 11:34 AM  PRIMARY CARE PHYSICIAN: [name], MD   ADMISSION DIAGNOSIS:  Hypertensive urgency [I16.0] DISCHARGE DIAGNOSIS:  Active Problems:   Hypertension  SECONDARY DIAGNOSIS:   Past Medical History:  Diagnosis Date   Arthritis    GERD (gastroesophageal reflux disease)    Heart murmur    has had years and years   History of shingles Dec 2013   treated with steroids , post op from shoulder surgery   Hypertension    Hypothyroidism    Osteoarthritis of left shoulder 07/14/2012   Polymyalgia rheumatica [facility])    HOSPITAL COURSE:   Kathryn Wise is an 85 year old female who presented to the ED with left arm numbness.  In the ED, she was noted to have blood pressures in the low 607P systolic.  CT head was negative.  She was admitted for further management.  Hypertension- blood pressures initially markedly elevated, but were normal on the day of discharge. -Lisinopril was stopped -Amlodipine was prescribed on discharge -Patient needs to follow-up with PCP for blood pressure check  Mild left upper extremity numbness- occurred for a brief period of time and quickly resolved. -CT head negative -MRI brain negative for acute stroke  Hypothyroidism -Continued home Synthroid  DISCHARGE CONDITIONS:  Hypertension Hypothyroidism CONSULTS OBTAINED:   DRUG ALLERGIES:   Allergies  Allergen Reactions   Alendronate Swelling    And nausea    Metoprolol Other (See Comments)    lethargy   Oxycontin [Oxycodone Hcl]     Altered mental status   DISCHARGE MEDICATIONS:   Allergies as of 11/09/2018      Reactions   Alendronate Swelling   And nausea    Metoprolol Other (See Comments)   lethargy   Oxycontin [oxycodone Hcl]     Altered mental status      Medication List    STOP taking these medications   lisinopril 20 MG tablet Commonly known as:  PRINIVIL,ZESTRIL     TAKE these medications   amLODipine 10 MG tablet Commonly known as:  NORVASC Take 1 tablet (10 mg total) by mouth daily.   aspirin 81 MG tablet Take 81 mg by mouth daily.   levothyroxine 88 MCG tablet Commonly known as:  SYNTHROID, LEVOTHROID TAKE 1 TABLET BY MOUTH ONCE DAILY ON AN EMPTY STOMACH. WAIT 30 MINUTES BEFORE TAKING OTHER MEDS.   nitroGLYCERIN 0.4 MG SL tablet Commonly known as:  NITROSTAT Place 1 tablet (0.4 mg total) every 5 (five) minutes as needed under the tongue for chest pain.   POTASSIUM PO Take 1 capsule by mouth daily.   Restasis 0.05 % ophthalmic emulsion Generic drug:  cycloSPORINE Place 1 drop into both eyes 2 (two) times daily.   Vitamin B 12 100 MCG Lozg Take 1 tablet by mouth daily.   Vitamin D-3 25 MCG (1000 UT) Caps Take 1 capsule by mouth daily.        DISCHARGE INSTRUCTIONS:  1.  Follow-up with PCP in 5 days 2.  Take Norvasc 10 mg daily as prescribed DIET:  Cardiac diet DISCHARGE CONDITION:  Stable ACTIVITY:  Activity as tolerated OXYGEN:  Home Oxygen: No.  Oxygen Delivery: room air DISCHARGE LOCATION:  home   If you experience worsening of your admission symptoms, develop  shortness of breath, life threatening emergency, suicidal or homicidal thoughts you must seek medical attention immediately by calling 911 or calling your MD immediately  if symptoms less severe.  You Must read complete instructions/literature along with all the possible adverse reactions/side effects for all the Medicines you take and that have been prescribed to you. Take any new Medicines after you have completely understood and accpet all the possible adverse reactions/side effects.   Please note  You were cared for by a hospitalist during your hospital stay. If you have any questions about your discharge  medications or the care you received while you were in the hospital after you are discharged, you can call the unit and asked to speak with the hospitalist on call if the hospitalist that took care of you is not available. Once you are discharged, your primary care physician will handle any further medical issues. Please note that NO REFILLS for any discharge medications will be authorized once you are discharged, as it is imperative that you return to your primary care physician (or establish a relationship with a primary care physician if you do not have one) for your aftercare needs so that they can reassess your need for medications and monitor your lab values.    On the day of Discharge:  VITAL SIGNS:  Blood pressure 133/64, pulse 68, temperature 98.5 F (36.9 C), temperature source Oral, resp. rate 16, height 5\' 6"  (1.676 m), weight 70.3 kg, SpO2 97 %. PHYSICAL EXAMINATION:  GENERAL:  85 y.o.-year-old patient lying in the bed with no acute distress.  EYES: Pupils equal, round, reactive to light and accommodation. No scleral icterus. Extraocular muscles intact.  HEENT: Head atraumatic, normocephalic. Oropharynx and nasopharynx clear.  NECK:  Supple, no jugular venous distention. No thyroid enlargement, no tenderness.  LUNGS: Normal breath sounds bilaterally, no wheezing, rales,rhonchi or crepitation. No use of accessory muscles of respiration.  CARDIOVASCULAR: RRR, S1, S2 normal. No murmurs, rubs, or gallops.  ABDOMEN: Soft, non-tender, non-distended. Bowel sounds present. No organomegaly or mass.  EXTREMITIES: No pedal edema, cyanosis, or clubbing.  NEUROLOGIC: Cranial nerves II through XII are intact. Muscle strength 5/5 in all extremities. Sensation intact. Gait not checked.  PSYCHIATRIC: The patient is alert and oriented x 3.  SKIN: No obvious rash, lesion, or ulcer.  DATA REVIEW:   CBC Recent Labs  Lab 11/09/18 0448  WBC 8.5  HGB 10.8*  HCT 35.6*  PLT 205    Chemistries    Recent Labs  Lab 11/08/18 1610 11/09/18 0448  NA 139 140  K 3.8 4.0  CL 104 106  CO2 24 24  GLUCOSE 101* 91  BUN 16 13  CREATININE 0.91 0.81  CALCIUM 9.0 8.8*  AST 19  --   ALT 13  --   ALKPHOS 56  --   BILITOT 0.4  --      Microbiology Results  Results for orders placed or performed in visit on 10/22/13  Fecal occult blood, imunochemical     Status: None   Collection Time: 10/22/13  2:34 PM  Result Value Ref Range Status   Fecal Occult Bld Negative Negative Final    RADIOLOGY:  Ct Head Wo Contrast  Result Date: 11/08/2018 CLINICAL DATA:  Left arm numbness. EXAM: CT HEAD WITHOUT CONTRAST TECHNIQUE: Contiguous axial images were obtained from the base of the skull through the vertex without intravenous contrast. COMPARISON:  10/17/2018 FINDINGS: Brain: There is no evidence of acute infarct, intracranial hemorrhage, mass, midline shift, or extra-axial  fluid collection. Mild cerebral atrophy is within normal limits for age. Cerebral white matter hypodensities are similar to the prior study and nonspecific but compatible with minimal chronic small vessel ischemic disease. Vascular: Mild calcified atherosclerosis at the skull base. No hyperdense vessel. Skull: No fracture or focal osseous lesion. Sinuses/Orbits: Visualized paranasal sinuses and mastoid air cells are clear. Visualized orbits are unremarkable. Other: None. IMPRESSION: No evidence of acute intracranial abnormality. Electronically Signed   By: Logan Bores M.D.   On: 11/08/2018 17:19   Mr Brain Wo Contrast  Result Date: 11/08/2018 CLINICAL DATA:  Left arm heaviness. EXAM: MRI HEAD WITHOUT CONTRAST TECHNIQUE: Multiplanar, multiecho pulse sequences of the brain and surrounding structures were obtained without intravenous contrast. COMPARISON:  Head CT 11/08/2018 FINDINGS: Brain: There is no evidence of acute infarct, intracranial hemorrhage, mass, midline shift, or extra-axial fluid collection. Cerebral atrophy is not greater  than expected for age. Cerebral white matter T2 hyperintensities are nonspecific but compatible with mild chronic small vessel ischemic disease. A small chronic infarct is noted in the left cerebellar hemisphere. Vascular: Major intracranial vascular flow voids are preserved. Skull and upper cervical spine: Unremarkable bone marrow signal. Sinuses/Orbits: Bilateral cataract extraction. Paranasal sinuses and mastoid air cells are clear. Other: None. IMPRESSION: 1. No acute intracranial abnormality. 2. Mild chronic small vessel ischemic disease. 3. Small chronic left cerebellar infarct. Electronically Signed   By: Logan Bores M.D.   On: 11/08/2018 19:45   Dg Chest Portable 1 View  Result Date: 11/08/2018 CLINICAL DATA:  Hypertension EXAM: PORTABLE CHEST 1 VIEW COMPARISON:  Chest x-ray dated 06/10/2017. FINDINGS: Heart size and mediastinal contours are within normal limits. Lungs are clear. No pleural effusion or pneumothorax seen. No acute appearing osseous abnormality. LEFT shoulder arthroplasty. IMPRESSION: No active disease. No evidence of pneumonia or pulmonary edema. Electronically Signed   By: Franki Cabot M.D.   On: 11/08/2018 16:42     Management plans discussed with the patient, family and they are in agreement.  CODE STATUS: DNR   TOTAL TIME TAKING CARE OF THIS PATIENT: 35 minutes.    Berna Spare Micala Saltsman M.D on 11/09/2018 at 1:16 PM  Between 7am to 6pm - Pager - [phone]  After 6pm go to [email or web address] - Proofreader  [facility]  Office  [phone]  CC: Primary care physician; [name], MD   Note: This dictation was prepared with Dragon dictation along with smaller phrase technology. Any transcriptional errors that result from this process are unintentional.

## 2018-11-09 NOTE — Discharge Instructions (Signed)
It was so nice to meet you during this hospitalization!  You came into the hospital with high blood pressure and left arm numbness. The MRI of your brain did not show a stroke.  I have prescribed the following medications: 1. Amlodipine (Norvasc) 10mg  daily  **You should STOP taking the Lisinopril**  Please make sure you follow-up with your primary care doctor for a blood pressure check.  Take care, Dr. Brett Albino

## 2018-11-10 ENCOUNTER — Telehealth: Payer: Self-pay | Admitting: Internal Medicine

## 2018-11-10 NOTE — Telephone Encounter (Signed)
Patient notified and voiced understanding to  Directions.

## 2018-11-10 NOTE — Telephone Encounter (Signed)
Continue 5 mg twice daily

## 2018-11-10 NOTE — Telephone Encounter (Signed)
Will this require a hospital follow up?

## 2018-11-10 NOTE — Telephone Encounter (Signed)
Would you like to schedule pt for a follow up on blood pressure tomorrow or do you think it would be okay to schedule her for another day this week?

## 2018-11-10 NOTE — Telephone Encounter (Signed)
Transition Care Management Follow-up Telephone Call  How have you been since you were released from the hospital? Patient seen in ER on 11/08/18 for hypertensive crisis per patient BP was 210/100 at home arrival at ER shows BP 205/79, patient  Was admitted for observation and restarted on amlodipine 10 mg, patient says she has been taking since discharge 5 mgnin the am and 5 mg in the PM BP when nurse spoke with patient 145/74 pulse 74, patient ask could she continue 5 MG BID until appointment advise patient would contact PCP for determination but to continue until receives call back from nurse. HFU scheduled for 11/12/18   Do you understand why you were in the hospital? yes   Do you understand the discharge instrcutions? yes  Items Reviewed:  Medications reviewed: yes  Allergies reviewed: yes  Dietary changes reviewed: yes  Referrals reviewed: yes   Functional Questionnaire:   Activities of Daily Living (ADLs):   She states they are independent in the following: ambulation, bathing and hygiene, feeding, continence, grooming, toileting and dressing States they require assistance with the following: No assistance requested at this time.   Any transportation issues/concerns?: no   Any patient concerns? no   Confirmed importance and date/time of follow-up visits scheduled: yes   Confirmed with patient if condition begins to worsen call PCP or go to the ER.  Patient was given the Call-a-Nurse line [phone]: yes

## 2018-11-11 ENCOUNTER — Other Ambulatory Visit: Payer: Self-pay | Admitting: Internal Medicine

## 2018-11-11 DIAGNOSIS — I10 Essential (primary) hypertension: Secondary | ICD-10-CM

## 2018-11-12 ENCOUNTER — Encounter: Payer: Self-pay | Admitting: Internal Medicine

## 2018-11-12 ENCOUNTER — Ambulatory Visit (INDEPENDENT_AMBULATORY_CARE_PROVIDER_SITE_OTHER): Payer: Medicare Other | Admitting: Internal Medicine

## 2018-11-12 VITALS — BP 131/64

## 2018-11-12 DIAGNOSIS — E538 Deficiency of other specified B group vitamins: Secondary | ICD-10-CM | POA: Diagnosis not present

## 2018-11-12 DIAGNOSIS — Z09 Encounter for follow-up examination after completed treatment for conditions other than malignant neoplasm: Secondary | ICD-10-CM | POA: Diagnosis not present

## 2018-11-12 DIAGNOSIS — I10 Essential (primary) hypertension: Secondary | ICD-10-CM | POA: Diagnosis not present

## 2018-11-12 DIAGNOSIS — D649 Anemia, unspecified: Secondary | ICD-10-CM

## 2018-11-12 DIAGNOSIS — Z8673 Personal history of transient ischemic attack (TIA), and cerebral infarction without residual deficits: Secondary | ICD-10-CM | POA: Diagnosis not present

## 2018-11-12 MED ORDER — AMLODIPINE BESYLATE 10 MG PO TABS: 5.0000 mg | ORAL_TABLET | Freq: Two times a day (BID) | ORAL | 0 refills | Status: DC

## 2018-11-12 NOTE — Progress Notes (Signed)
Virtual Visit via [email or web address]  This visit type was conducted due to national recommendations for restrictions regarding the COVID-19 pandemic (e.g. social distancing).  This format is felt to be most appropriate for this patient at this time.  All issues noted in this document were discussed and addressed.  No physical exam was performed (except for noted visual exam findings with Video Visits).   I connected with@ on 11/13/18 at 11:00 AM EDT by a video enabled telemedicine application or telephone and verified that I am speaking with the correct person using two identifiers. Location patient: home Location provider: work or home office Persons participating in the virtual visit: patient, provider  I discussed the limitations, risks, security and privacy concerns of performing an evaluation and management service by telephone and the availability of in person appointments. I also discussed with the patient that there may be a patient responsible charge related to this service. The patient expressed understanding and agreed to proceed.  Reason for visit: follow up on hospital admission for hypertensive urgency   HPI:  Admitted to [facility] on April 11 with BPS > 200 and left arm numbness/heaviness.  She was evaluated for stroke with CT head and MRI;  both negative for acute stroke  and discharged April 12.   Imaging  studies and medicatio changes reviewed:  A small old (chronic) infarct In the left cerebellum was noted  And small vessel ischemic disease noted .  No prior MRI for comparison . She recalls a history of severe headaches lasting several days when she was in her early 69's.   Lisinopril stopped.  Sent home on amlodipine taking it 5 mg bid  Bps 140 at home    And she feels fine.  No headaches,  No arm weakness or numbness      ROS: See pertinent positives and negatives per HPI.  Past Medical History:  Diagnosis Date  . Arthritis   . GERD (gastroesophageal reflux disease)   . Heart murmur     has had years and years  . History of shingles Dec 2013   treated with steroids , post op from shoulder surgery  . Hypertension   . Hypothyroidism   . Osteoarthritis of left shoulder 07/14/2012  . Polymyalgia rheumatica (HCC)     Past Surgical History:  Procedure Laterality Date  . ARTHOSCOPIC ROTAOR CUFF REPAIR     LEFT  10+  YEARS    . CATARACT EXTRACTION W/ INTRAOCULAR LENS IMPLANT     RIGHT EYE  . CATARACT EXTRACTION W/PHACO Left 03/28/2016   Procedure: CATARACT EXTRACTION PHACO AND INTRAOCULAR LENS PLACEMENT (IOC);  Surgeon: Leandrew Koyanagi, MD;  Location: [place];  Service: Ophthalmology;  Laterality: Left;  TORIC  . FACELIFT    . FOOT SURGERY     rt foot   TUMOR REMOVED  . JOINT REPLACEMENT Left Dec 2013   shoulder  . LEFT HEART CATH AND CORONARY ANGIOGRAPHY N/A 06/12/2017   Procedure: LEFT HEART CATH AND CORONARY ANGIOGRAPHY;  Surgeon: Minna Merritts, MD;  Location: [place] [facility];  Service: Cardiovascular;  Laterality: N/A;  . SKIN CANCER EXCISION    . TOTAL SHOULDER ARTHROPLASTY  07/14/2012   Procedure: TOTAL SHOULDER ARTHROPLASTY;  Surgeon: Johnny Bridge, MD;  Location: [place];  Service: Orthopedics;  Laterality: Left;    Family History  Problem Relation Age of Onset  . Cancer Daughter        Breast  . Breast cancer Daughter 58  . Cancer Son  SOCIAL HX: lives alone    Current Outpatient Medications:  .  amLODipine (NORVASC) 10 MG tablet, Take 0.5 tablets (5 mg total) by mouth 2 (two) times daily., Disp: 90 tablet, Rfl: 0 .  aspirin 81 MG tablet, Take 81 mg by mouth daily., Disp: , Rfl:  .  Cholecalciferol (VITAMIN D-3) 1000 units CAPS, Take 1 capsule by mouth daily., Disp: , Rfl:  .  Cyanocobalamin (VITAMIN B 12) 100 MCG LOZG, Take 1 tablet by mouth daily. , Disp: , Rfl:  .  levothyroxine (SYNTHROID, LEVOTHROID) 88 MCG tablet, TAKE 1 TABLET BY MOUTH ONCE DAILY ON AN EMPTY STOMACH. WAIT 30 MINUTES BEFORE TAKING OTHER MEDS., Disp:  90 tablet, Rfl: 0 .  nitroGLYCERIN (NITROSTAT) 0.4 MG SL tablet, Place 1 tablet (0.4 mg total) every 5 (five) minutes as needed under the tongue for chest pain., Disp: 30 tablet, Rfl: 2 .  POTASSIUM PO, Take 1 capsule by mouth daily., Disp: , Rfl:  .  RESTASIS 0.05 % ophthalmic emulsion, Place 1 drop into both eyes 2 (two) times daily. , Disp: , Rfl:   EXAM:  VITALS per patient if applicable:  GENERAL: alert, oriented, appears well and in no acute distress  HEENT: atraumatic, conjunttiva clear, no obvious abnormalities on inspection of external nose and ears  NECK: normal movements of the head and neck  LUNGS: on inspection no signs of respiratory distress, breathing rate appears normal, no obvious gross SOB, gasping or wheezing  CV: no obvious cyanosis  MS: moves all visible extremities without noticeable abnormality  PSYCH/NEURO: pleasant and cooperative, no obvious depression or anxiety, speech and thought processing grossly intact  ASSESSMENT AND PLAN:  Discussed the following assessment and plan:  Anemia, unspecified type - Plan: Iron, TIBC and Ferritin Panel, Basic metabolic panel, POCT occult blood stool, CBC with Differential/Platelet  B12 deficiency - Plan: Vitamin B12  Essential hypertension  Hospital discharge follow-up  History of CVA (cerebrovascular accident) without residual deficits  Hypertension continue amlodipine 5 mg bid .  She has not tolerated 10 mg dose at bedtime   Hospital discharge follow-up Patient is stable post discharge and has no new issues or questions about discharge plans at the visit today for hospital follow up. All labs , imaging studies and progress notes from admission were reviewed with patient today    History of CVA (cerebrovascular accident) without residual deficits cerebellar infarct, remote,  Noted on MRI during recent admission .  discussed with patient   Anemia, unspecified Noted during hospitalization .  Checking B12  folate and iron  Lab Results  Component Value Date   WBC 8.5 11/09/2018   HGB 10.8 (L) 11/09/2018   HCT 35.6 (L) 11/09/2018   MCV 83.8 11/09/2018   PLT 205 11/09/2018       I discussed the assessment and treatment plan with the patient. The patient was provided an opportunity to ask questions and all were answered. The patient agreed with the plan and demonstrated an understanding of the instructions.   The patient was advised to call back or seek an in-person evaluation if the symptoms worsen or if the condition fails to improve as anticipated.    Kathryn Mc, MD

## 2018-11-12 NOTE — Telephone Encounter (Signed)
scheduled

## 2018-11-13 DIAGNOSIS — Z8673 Personal history of transient ischemic attack (TIA), and cerebral infarction without residual deficits: Secondary | ICD-10-CM | POA: Insufficient documentation

## 2018-11-13 DIAGNOSIS — D649 Anemia, unspecified: Secondary | ICD-10-CM | POA: Insufficient documentation

## 2018-11-13 NOTE — Assessment & Plan Note (Signed)
Patient is stable post discharge and has no new issues or questions about discharge plans at the visit today for hospital follow up. All labs , imaging studies and progress notes from admission were reviewed with patient today   

## 2018-11-13 NOTE — Assessment & Plan Note (Signed)
cerebellar infarct, remote,  Noted on MRI during recent admission .  discussed with patient

## 2018-11-13 NOTE — Assessment & Plan Note (Signed)
continue amlodipine 5 mg bid .  She has not tolerated 10 mg dose at bedtime

## 2018-11-13 NOTE — Assessment & Plan Note (Signed)
Noted during hospitalization .  Checking B12 folate and iron  Lab Results  Component Value Date   WBC 8.5 11/09/2018   HGB 10.8 (L) 11/09/2018   HCT 35.6 (L) 11/09/2018   MCV 83.8 11/09/2018   PLT 205 11/09/2018

## 2018-12-01 ENCOUNTER — Ambulatory Visit (INDEPENDENT_AMBULATORY_CARE_PROVIDER_SITE_OTHER): Payer: Medicare Other | Admitting: Internal Medicine

## 2018-12-01 DIAGNOSIS — D649 Anemia, unspecified: Secondary | ICD-10-CM | POA: Diagnosis not present

## 2018-12-01 DIAGNOSIS — R609 Edema, unspecified: Secondary | ICD-10-CM | POA: Diagnosis not present

## 2018-12-01 DIAGNOSIS — R011 Cardiac murmur, unspecified: Secondary | ICD-10-CM | POA: Diagnosis not present

## 2018-12-01 MED ORDER — FUROSEMIDE 20 MG PO TABS: 20.0000 mg | ORAL_TABLET | Freq: Every day | ORAL | 1 refills | Status: DC

## 2018-12-01 NOTE — Progress Notes (Signed)
Telephone Note  This visit type was conducted due to national recommendations for restrictions regarding the COVID-19 pandemic (e.g. social distancing).  This format is felt to be most appropriate for this patient at this time.  All issues noted in this document were discussed and addressed.  No physical exam was performed (except for noted visual exam findings with Video Visits).   I connected with@ on 12/01/18 at  2:00 PM EDT by telephone and verified that I am speaking with the correct person using two identifiers. Location patient: home Location provider: work  Persons participating in the virtual visit: patient, provider  I discussed the limitations, risks, security and privacy concerns of performing an evaluation and management service by telephone and the availability of in person appointments. I also discussed with the patient that there may be a patient responsible charge related to this service. The patient expressed understanding and agreed to proceed.  Reason for visit: feeling bad,  Swelling of legs and feet   HPI:    85 yr old female with essential hypertension,  Anxiety recently admitted for hypertensive crisis,  Ruled out for acute cva.    Has been taking aspirin amlodipine 5 mg bid with improved BP control  ,  But she has started to retain fluid .  Tried taking a dose of furosemide last evening,  No results.  Has not used it in a year, medication was prescribed April 2019   Spent all day Saturday cleaning out her attic.  Then yesterday  laid on the couch and read a book because she ached all over.    Still feeling "cold all over'  But denies chills,  Shortness of breath and dysuria.  Has not come in for the anemia workup yet.    ROS: See pertinent positives and negatives per HPI.  Past Medical History:  Diagnosis Date  . Arthritis   . Dysphagia, pharyngoesophageal phase 09/10/2013   Upper GI study with barium swallow was done at  [facility] Mar 2015   No reflux seen  Small  irreducible hiatal hernia  Mild changes of presbyeophagus (abnormal contractions of the esophagus that occur with aging) No strictures Normal gastric emptying  Incomplete visualization of stomach fold due to patient's inability turn    . GERD (gastroesophageal reflux disease)   . Heart murmur    has had years and years  . History of shingles Dec 2013   treated with steroids , post op from shoulder surgery  . Hypertension   . Hypothyroidism   . Major depressive disorder, single episode 11/05/2015  . Osteoarthritis of left shoulder 07/14/2012  . Polymyalgia rheumatica (HCC)     Past Surgical History:  Procedure Laterality Date  . ARTHOSCOPIC ROTAOR CUFF REPAIR     LEFT  10+  YEARS    . CATARACT EXTRACTION W/ INTRAOCULAR LENS IMPLANT     RIGHT EYE  . CATARACT EXTRACTION W/PHACO Left 03/28/2016   Procedure: CATARACT EXTRACTION PHACO AND INTRAOCULAR LENS PLACEMENT (IOC);  Surgeon: Leandrew Koyanagi, MD;  Location: [place];  Service: Ophthalmology;  Laterality: Left;  TORIC  . FACELIFT    . FOOT SURGERY     rt foot   TUMOR REMOVED  . JOINT REPLACEMENT Left Dec 2013   shoulder  . LEFT HEART CATH AND CORONARY ANGIOGRAPHY N/A 06/12/2017   Procedure: LEFT HEART CATH AND CORONARY ANGIOGRAPHY;  Surgeon: Minna Merritts, MD;  Location: [place] [facility];  Service: Cardiovascular;  Laterality: N/A;  . SKIN CANCER EXCISION    .  TOTAL SHOULDER ARTHROPLASTY  07/14/2012   Procedure: TOTAL SHOULDER ARTHROPLASTY;  Surgeon: Johnny Bridge, MD;  Location: [place];  Service: Orthopedics;  Laterality: Left;    Family History  Problem Relation Age of Onset  . Cancer Daughter        Breast  . Breast cancer Daughter 40  . Cancer Son     SOCIAL HX: single, lives alone.  IADL, no tobacco or alcohol    Current Outpatient Medications:  .  amLODipine (NORVASC) 10 MG tablet, Take 0.5 tablets (5 mg total) by mouth 2 (two) times daily., Disp: 90 tablet, Rfl: 0 .  aspirin 81 MG tablet, Take  81 mg by mouth daily., Disp: , Rfl:  .  Cholecalciferol (VITAMIN D-3) 1000 units CAPS, Take 1 capsule by mouth daily., Disp: , Rfl:  .  Cyanocobalamin (VITAMIN B 12) 100 MCG LOZG, Take 1 tablet by mouth daily. , Disp: , Rfl:  .  levothyroxine (SYNTHROID, LEVOTHROID) 88 MCG tablet, TAKE 1 TABLET BY MOUTH ONCE DAILY ON AN EMPTY STOMACH. WAIT 30 MINUTES BEFORE TAKING OTHER MEDS., Disp: 90 tablet, Rfl: 0 .  nitroGLYCERIN (NITROSTAT) 0.4 MG SL tablet, Place 1 tablet (0.4 mg total) every 5 (five) minutes as needed under the tongue for chest pain., Disp: 30 tablet, Rfl: 2 .  POTASSIUM PO, Take 1 capsule by mouth daily., Disp: , Rfl:  .  RESTASIS 0.05 % ophthalmic emulsion, Place 1 drop into both eyes 2 (two) times daily. , Disp: , Rfl:  .  furosemide (LASIX) 20 MG tablet, Take 1 tablet (20 mg total) by mouth daily., Disp: 90 tablet, Rfl: 1  EXAM:   General impression: alert, cooperative and articulate.  No signs of being in distress  Lungs: speech is fluent.  sentence length suggests that patient is not short of breath and not punctuated by cough, sneezing or sniffing. [name]   Psych: affect normal.  speech is articulate and non pressured .  Denies suicidal thoughts   ASSESSMENT AND PLAN:  Discussed the following assessment and plan:  Edema, unspecified type  Heart murmur  Anemia, unspecified type  Edema Side effect of amlodipine.  Adding furosemide prn .  Continue amlodipine given multiple medication intolerances   Anemia, unspecified Noted during hospitalization .  Patient reminded to come in for labs to rule out deficiencies of  B12 folate and iron  Lab Results  Component Value Date   WBC 8.5 11/09/2018   HGB 10.8 (L) 11/09/2018   HCT 35.6 (L) 11/09/2018   MCV 83.8 11/09/2018   PLT 205 11/09/2018       I discussed the assessment and treatment plan with the patient. The patient was provided an opportunity to ask questions and all were answered. The patient agreed with the plan and  demonstrated an understanding of the instructions.   The patient was advised to call back or seek an in-person evaluation if the symptoms worsen or if the condition fails to improve as anticipated.  I provided 25  minutes of non-face-to-face time during this encounter.   [name], MD

## 2018-12-02 ENCOUNTER — Encounter: Payer: Self-pay | Admitting: Internal Medicine

## 2018-12-02 NOTE — Assessment & Plan Note (Signed)
Noted during hospitalization .  Patient reminded to come in for labs to rule out deficiencies of  B12 folate and iron  Lab Results  Component Value Date   WBC 8.5 11/09/2018   HGB 10.8 (L) 11/09/2018   HCT 35.6 (L) 11/09/2018   MCV 83.8 11/09/2018   PLT 205 11/09/2018

## 2018-12-02 NOTE — Assessment & Plan Note (Signed)
Side effect of amlodipine.  Adding furosemide prn .  Continue amlodipine given multiple medication intolerances

## 2018-12-03 ENCOUNTER — Other Ambulatory Visit: Payer: Self-pay

## 2018-12-03 ENCOUNTER — Other Ambulatory Visit (INDEPENDENT_AMBULATORY_CARE_PROVIDER_SITE_OTHER): Payer: Medicare Other

## 2018-12-03 DIAGNOSIS — D649 Anemia, unspecified: Secondary | ICD-10-CM | POA: Diagnosis not present

## 2018-12-03 DIAGNOSIS — E538 Deficiency of other specified B group vitamins: Secondary | ICD-10-CM | POA: Diagnosis not present

## 2018-12-03 NOTE — Addendum Note (Signed)
Addended by: Arby Barrette on: 12/03/2018 02:17 PM   Modules accepted: Orders

## 2018-12-04 LAB — CBC WITH DIFFERENTIAL/PLATELET
Absolute Monocytes: 989 cells/uL — ABNORMAL HIGH (ref 200–950)
Basophils Absolute: 38 cells/uL (ref 0–200)
Basophils Relative: 0.4 %
Eosinophils Absolute: 77 cells/uL (ref 15–500)
Eosinophils Relative: 0.8 %
HCT: 34.8 % — ABNORMAL LOW (ref 35.0–45.0)
Hemoglobin: 11.5 g/dL — ABNORMAL LOW (ref 11.7–15.5)
Lymphs Abs: 2794 cells/uL (ref 850–3900)
MCH: 26.4 pg — ABNORMAL LOW (ref 27.0–33.0)
MCHC: 33 g/dL (ref 32.0–36.0)
MCV: 80 fL (ref 80.0–100.0)
MPV: 11.6 fL (ref 7.5–12.5)
Monocytes Relative: 10.3 %
Neutro Abs: 5702 cells/uL (ref 1500–7800)
Neutrophils Relative %: 59.4 %
Platelets: 244 10*3/uL (ref 140–400)
RBC: 4.35 10*6/uL (ref 3.80–5.10)
RDW: 15 % (ref 11.0–15.0)
Total Lymphocyte: 29.1 %
WBC: 9.6 10*3/uL (ref 3.8–10.8)

## 2018-12-04 LAB — BASIC METABOLIC PANEL
BUN/Creatinine Ratio: 18 (calc) (ref 6–22)
BUN: 20 mg/dL (ref 7–25)
CO2: 26 mmol/L (ref 20–32)
Calcium: 9.5 mg/dL (ref 8.6–10.4)
Chloride: 101 mmol/L (ref 98–110)
Creat: 1.14 mg/dL — ABNORMAL HIGH (ref 0.60–0.88)
Glucose, Bld: 167 mg/dL — ABNORMAL HIGH (ref 65–99)
Potassium: 4.2 mmol/L (ref 3.5–5.3)
Sodium: 138 mmol/L (ref 135–146)

## 2018-12-04 LAB — IRON,TIBC AND FERRITIN PANEL
%SAT: 15 % (calc) — ABNORMAL LOW (ref 16–45)
Ferritin: 13 ng/mL — ABNORMAL LOW (ref 16–288)
Iron: 55 ug/dL (ref 45–160)
TIBC: 362 mcg/dL (calc) (ref 250–450)

## 2018-12-04 LAB — VITAMIN B12: Vitamin B-12: 1189 pg/mL — ABNORMAL HIGH (ref 200–1100)

## 2018-12-04 NOTE — Addendum Note (Signed)
Addended by: Nanci Pina on: 12/04/2018 02:22 PM   Modules accepted: Orders

## 2018-12-04 NOTE — Progress Notes (Signed)
CBC ordered lab scheduled.

## 2018-12-08 ENCOUNTER — Ambulatory Visit: Payer: Medicare Other | Admitting: Internal Medicine

## 2018-12-09 ENCOUNTER — Telehealth: Payer: Self-pay | Admitting: Internal Medicine

## 2018-12-09 ENCOUNTER — Ambulatory Visit: Payer: Self-pay

## 2018-12-09 NOTE — Telephone Encounter (Signed)
See triage note.

## 2018-12-09 NOTE — Telephone Encounter (Signed)
Left message for patient to return call to office, need to know if patient taking medication PRN lasix or 20 mg daily, is she elevating legs, how is blood pressure, readings ETC... [facility] nurse may advise.

## 2018-12-09 NOTE — Telephone Encounter (Signed)
Pt was returning call to office.  She was read message. Pt states that her swelling of her feet and ankles is not improving. She states that the swelling is starting into her calf both sides.  She  Is taking 20 mg lasix daily.  She elvates her legs every evening.  She states she is busy during the day. She denies pain stating that they are just achy. Her BP while talking to me 155/85 HR 90. She has no trouble breathing. Care advice read to patient. Patient verbalized understanding. Note will be routed for appointment in AM  Reason for Disposition . [1] MODERATE pain (e.g., interferes with normal activities, limping) AND [2] present > 3 days  Answer Assessment - Initial Assessment Questions 1. LOCATION: "Which joint is swollen?"     Ankles both sides 2. ONSET: "When did the swelling start?"     3 days 3. SIZE: "How large is the swelling?"    Moving into calfs 4. PAIN: "Is there any pain?" If so, ask: "How bad is it?" (Scale 1-10; or mild, moderate, severe)     None achy not pain 5. CAUSE: "What do you think caused the swollen joint?"     Side effect of BP medication 6. OTHER SYMPTOMS: "Do you have any other symptoms?" (e.g., fever, chest pain, difficulty breathing, calf pain)     Swelling moving into calfs 7. PREGNANCY: "Is there any chance you are pregnant?" "When was your last menstrual period?"   N/A  Protocols used: ANKLE SWELLING-A-AH

## 2018-12-09 NOTE — Telephone Encounter (Signed)
Copied from [place] [id number]. Topic: General - Other >> Dec 09, 2018 11:24 AM Lennox Solders wrote:  Reason for CRM pt is calling to let dr Derrel Nip know the lasix is not working. Pt is still having leg and feet swelling. Pt is taking one lasix. Please adviseTarheel drug

## 2018-12-10 NOTE — Telephone Encounter (Addendum)
Scheduled telephone visit with PCP. Advised patient any change in symptoms to call the office if SOB or chest pain develop to be evaluated at [facility] or ER immediately. Scheduled with PCP.

## 2018-12-10 NOTE — Telephone Encounter (Signed)
See triage note.

## 2018-12-11 ENCOUNTER — Other Ambulatory Visit: Payer: Self-pay | Admitting: Internal Medicine

## 2018-12-11 ENCOUNTER — Ambulatory Visit (INDEPENDENT_AMBULATORY_CARE_PROVIDER_SITE_OTHER): Payer: Medicare Other | Admitting: Internal Medicine

## 2018-12-11 ENCOUNTER — Encounter: Payer: Self-pay | Admitting: Internal Medicine

## 2018-12-11 VITALS — BP 137/68 | HR 98

## 2018-12-11 DIAGNOSIS — R0989 Other specified symptoms and signs involving the circulatory and respiratory systems: Secondary | ICD-10-CM

## 2018-12-11 DIAGNOSIS — I10 Essential (primary) hypertension: Secondary | ICD-10-CM

## 2018-12-11 NOTE — Telephone Encounter (Signed)
Looks like this medication was discontinued on 11/08/2018.

## 2018-12-11 NOTE — Patient Instructions (Signed)
Continue your current blood pressure medications  Use the lasix as needed,  Up to 40 mg daily  For edema   You do not have to have any more mammograms if you do not want to every treat a cancer if we found one

## 2018-12-11 NOTE — Progress Notes (Signed)
Telephone  Note  This visit type was conducted due to national recommendations for restrictions regarding the COVID-19 pandemic (e.g. social distancing).  This format is felt to be most appropriate for this patient at this time.  All issues noted in this document were discussed and addressed.  No physical exam was performed (except for noted visual exam findings with Video Visits).   I connected with@ on 12/11/18 at  8:30 AM EDT by telephone and verified that I am speaking with the correct person using two identifiers. Location patient: home Location provider: work or home office Persons participating in the virtual visit: patient, provider  I discussed the limitations, risks, security and privacy concerns of performing an evaluation and management service by telephone and the availability of in person appointments. I also discussed with the patient that there may be a patient responsible charge related to this service. The patient expressed understanding and agreed to proceed.  Reason for visit:   HPI:  Lower extremity swelling and elevated blood pressure for the last several days.  The swelling did  not respond to 20 mg lasix taken daily since May 4;  However yesterday took aleve and tylenol due to leg pain and  States that her legs feel fine today,  She has no swelling and bp is "great"   She has been taking amlodipine 5 mg bid .  She has noted that when she is in pain or aggravated,  Her blood pressure becomes out of control.   ROS: See pertinent positives and negatives per HPI.  Past Medical History:  Diagnosis Date  . Arthritis   . Dysphagia, pharyngoesophageal phase 09/10/2013   Upper GI study with barium swallow was done at  [facility] Mar 2015   No reflux seen  Small irreducible hiatal hernia  Mild changes of presbyeophagus (abnormal contractions of the esophagus that occur with aging) No strictures Normal gastric emptying  Incomplete visualization of stomach fold due to patient's  inability turn    . GERD (gastroesophageal reflux disease)   . Heart murmur    has had years and years  . History of shingles Dec 2013   treated with steroids , post op from shoulder surgery  . Hypertension   . Hypothyroidism   . Major depressive disorder, single episode 11/05/2015  . Osteoarthritis of left shoulder 07/14/2012  . Polymyalgia rheumatica (HCC)     Past Surgical History:  Procedure Laterality Date  . ARTHOSCOPIC ROTAOR CUFF REPAIR     LEFT  10+  YEARS    . CATARACT EXTRACTION W/ INTRAOCULAR LENS IMPLANT     RIGHT EYE  . CATARACT EXTRACTION W/PHACO Left 03/28/2016   Procedure: CATARACT EXTRACTION PHACO AND INTRAOCULAR LENS PLACEMENT (IOC);  Surgeon: Leandrew Koyanagi, MD;  Location: [place];  Service: Ophthalmology;  Laterality: Left;  TORIC  . FACELIFT    . FOOT SURGERY     rt foot   TUMOR REMOVED  . JOINT REPLACEMENT Left Dec 2013   shoulder  . LEFT HEART CATH AND CORONARY ANGIOGRAPHY N/A 06/12/2017   Procedure: LEFT HEART CATH AND CORONARY ANGIOGRAPHY;  Surgeon: Minna Merritts, MD;  Location: [place] [facility];  Service: Cardiovascular;  Laterality: N/A;  . SKIN CANCER EXCISION    . TOTAL SHOULDER ARTHROPLASTY  07/14/2012   Procedure: TOTAL SHOULDER ARTHROPLASTY;  Surgeon: Johnny Bridge, MD;  Location: [place];  Service: Orthopedics;  Laterality: Left;    Family History  Problem Relation Age of Onset  . Cancer Daughter  Breast  . Breast cancer Daughter 59  . Cancer Son     SOCIAL HX: widowed, lives alone    Current Outpatient Medications:  .  amLODipine (NORVASC) 10 MG tablet, Take 0.5 tablets (5 mg total) by mouth 2 (two) times daily., Disp: 90 tablet, Rfl: 0 .  aspirin 81 MG tablet, Take 81 mg by mouth daily., Disp: , Rfl:  .  Cholecalciferol (VITAMIN D-3) 1000 units CAPS, Take 1 capsule by mouth daily., Disp: , Rfl:  .  Cyanocobalamin (VITAMIN B 12) 100 MCG LOZG, Take 1 tablet by mouth daily. , Disp: , Rfl:  .  ferrous sulfate  325 (65 FE) MG EC tablet, Take 325 mg by mouth daily with breakfast. , Disp: , Rfl:  .  furosemide (LASIX) 20 MG tablet, Take 1 tablet (20 mg total) by mouth daily., Disp: 90 tablet, Rfl: 1 .  levothyroxine (SYNTHROID, LEVOTHROID) 88 MCG tablet, TAKE 1 TABLET BY MOUTH ONCE DAILY ON AN EMPTY STOMACH. WAIT 30 MINUTES BEFORE TAKING OTHER MEDS., Disp: 90 tablet, Rfl: 0 .  nitroGLYCERIN (NITROSTAT) 0.4 MG SL tablet, Place 1 tablet (0.4 mg total) every 5 (five) minutes as needed under the tongue for chest pain., Disp: 30 tablet, Rfl: 2 .  POTASSIUM PO, Take 1 capsule by mouth daily., Disp: , Rfl:  .  RESTASIS 0.05 % ophthalmic emulsion, Place 1 drop into both eyes 2 (two) times daily. , Disp: , Rfl:   EXAM:  VITALS per patient if applicable:  General impression: alert, cooperative and articulate.  No signs of being in distress  Lungs: speech is fluent sentence length suggests that patient is not short of breath and not punctuated by cough, sneezing or sniffing. [name]   Psych: affect normal.  speech is articulate and non pressured .  Denies suicidal thoughts    ASSESSMENT AND PLAN:  Discussed the following assessment and plan:  Labile hypertension - Plan: Ambulatory referral to Vascular Surgery  Essential hypertension  Hypertension Has been difficult to control for the last several months,  With recent ER evaluation / cause unclear ,may be due to  to anxiety.  No changes today.  No history of snoring. .  Renal artery doppler ordered to rule out RAS     I discussed the assessment and treatment plan with the patient. The patient was provided an opportunity to ask questions and all were answered. The patient agreed with the plan and demonstrated an understanding of the instructions.   The patient was advised to call back or seek an in-person evaluation if the symptoms worsen or if the condition fails to improve as anticipated.  I provided 25 minutes of non-face-to-face time during this  encounter.   [name], MD

## 2018-12-11 NOTE — Assessment & Plan Note (Signed)
Has been difficult to control for the last several months,  With recent ER evaluation / cause unclear ,may be due to  to anxiety.  No changes today.  No history of snoring. .  Renal artery doppler ordered to rule out RAS

## 2018-12-29 ENCOUNTER — Other Ambulatory Visit: Payer: Self-pay | Admitting: Internal Medicine

## 2019-01-01 ENCOUNTER — Telehealth (INDEPENDENT_AMBULATORY_CARE_PROVIDER_SITE_OTHER): Payer: Self-pay

## 2019-01-01 ENCOUNTER — Encounter (INDEPENDENT_AMBULATORY_CARE_PROVIDER_SITE_OTHER): Payer: Self-pay | Admitting: Vascular Surgery

## 2019-01-01 ENCOUNTER — Other Ambulatory Visit: Payer: Self-pay

## 2019-01-01 ENCOUNTER — Ambulatory Visit (INDEPENDENT_AMBULATORY_CARE_PROVIDER_SITE_OTHER): Payer: Medicare Other | Admitting: Vascular Surgery

## 2019-01-01 ENCOUNTER — Other Ambulatory Visit (INDEPENDENT_AMBULATORY_CARE_PROVIDER_SITE_OTHER): Payer: Self-pay | Admitting: Vascular Surgery

## 2019-01-01 ENCOUNTER — Other Ambulatory Visit (INDEPENDENT_AMBULATORY_CARE_PROVIDER_SITE_OTHER): Payer: Medicare Other

## 2019-01-01 ENCOUNTER — Encounter (INDEPENDENT_AMBULATORY_CARE_PROVIDER_SITE_OTHER): Payer: Self-pay

## 2019-01-01 ENCOUNTER — Other Ambulatory Visit (INDEPENDENT_AMBULATORY_CARE_PROVIDER_SITE_OTHER): Payer: Self-pay

## 2019-01-01 VITALS — BP 159/69 | HR 76 | Resp 10 | Ht 66.0 in | Wt 155.0 lb

## 2019-01-01 DIAGNOSIS — I739 Peripheral vascular disease, unspecified: Secondary | ICD-10-CM | POA: Diagnosis not present

## 2019-01-01 DIAGNOSIS — K2100 Gastro-esophageal reflux disease with esophagitis, without bleeding: Secondary | ICD-10-CM

## 2019-01-01 DIAGNOSIS — I2510 Atherosclerotic heart disease of native coronary artery without angina pectoris: Secondary | ICD-10-CM

## 2019-01-01 DIAGNOSIS — Z87891 Personal history of nicotine dependence: Secondary | ICD-10-CM | POA: Diagnosis not present

## 2019-01-01 DIAGNOSIS — R0989 Other specified symptoms and signs involving the circulatory and respiratory systems: Secondary | ICD-10-CM | POA: Diagnosis not present

## 2019-01-01 DIAGNOSIS — Z79899 Other long term (current) drug therapy: Secondary | ICD-10-CM | POA: Diagnosis not present

## 2019-01-01 DIAGNOSIS — I701 Atherosclerosis of renal artery: Secondary | ICD-10-CM

## 2019-01-01 DIAGNOSIS — I150 Renovascular hypertension: Secondary | ICD-10-CM | POA: Diagnosis not present

## 2019-01-01 DIAGNOSIS — I890 Lymphedema, not elsewhere classified: Secondary | ICD-10-CM | POA: Insufficient documentation

## 2019-01-01 NOTE — Telephone Encounter (Signed)
Patient was seen today and given the the information regarding her angio on 01/07/2019 arrival time of 6:45 am with Dr. Delana Meyer. Patient will have Covid-19 testing on Friday 01/02/2019. All pre-procedure instructions were discussed as well as zero visitor policy. Patient was given the instructions for her procedure.

## 2019-01-01 NOTE — Progress Notes (Signed)
MRN : [id number]  Kathryn Wise is a 85 y.o. (03/25/1930) female who presents with chief complaint of  Chief Complaint  Patient presents with  . New Patient (Initial Visit)  .  History of Present Illness:   The patient is seen for evaluation of malignant hypertension which has been very difficult to control. The patient has a long history of hypertension which recently has become increasingly difficult to control utilizing medical therapy. The patient is consistently documented systolic blood pressures near 242 with diastolic pressures over 90.   The patient does have family history of hypertension.   There is no prior documented abdominal bruit. The patient occasionally has flushing symptoms but denies palpitations. No episodes of syncope.There is no history of headache. There is no history of flash pulmonary edema.  The patient denies a history of renal disease.  She is also c/o leg swelling. The patient first noticed the swelling a month or so ago after a change in her BP meds but is now concerned because of a significant increase in the overall edema. The swelling is associated with pain and discoloration. The patient notes that in the morning the legs are significantly improved but they steadily worsened throughout the course of the day. Elevation makes the legs better, dependency makes them much worse.   There is no history of ulcerations associated with the swelling.   The patient denies any recent changes in their medications.  The patient has not been wearing graduated compression.  The patient has no had any past angiography, interventions or vascular surgery.  The patient denies a history of DVT or PE. There is no prior history of phlebitis. There is no history of primary lymphedema.  The patient denies amaurosis fugax or recent TIA symptoms. There are no recent neurological changes noted. The patient denies claudication symptoms or rest pain symptoms. The patient  denies history of DVT, PE or superficial thrombophlebitis. The patient denies recent episodes of angina or shortness of breath.   Duplex ultrasound of the renal arteries demonstrates >60% left renal artery stenosis    Current Meds  Medication Sig  . amLODipine (NORVASC) 10 MG tablet Take 0.5 tablets (5 mg total) by mouth 2 (two) times daily.  [name] aspirin 81 MG tablet Take 81 mg by mouth daily.  . Cholecalciferol (VITAMIN D-3) 1000 units CAPS Take 1 capsule by mouth daily.  . Cyanocobalamin (VITAMIN B 12) 100 MCG LOZG Take 1 tablet by mouth daily.   . ferrous sulfate 325 (65 FE) MG EC tablet Take 325 mg by mouth daily with breakfast.   . furosemide (LASIX) 20 MG tablet Take 1 tablet (20 mg total) by mouth daily.  [name] levothyroxine (SYNTHROID, LEVOTHROID) 88 MCG tablet TAKE 1 TABLET BY MOUTH ONCE DAILY ON AN EMPTY STOMACH. WAIT 30 MINUTES BEFORE TAKING OTHER MEDS.  [name] RESTASIS 0.05 % ophthalmic emulsion Place 1 drop into both eyes 2 (two) times daily.   . temazepam (RESTORIL) 15 MG capsule TAKE 1 CAPSULE BY MOUTH AT BEDTIME AS NEEDED SLEEP    Past Medical History:  Diagnosis Date  . Arthritis   . Dysphagia, pharyngoesophageal phase 09/10/2013   Upper GI study with barium swallow was done at  [facility] Mar 2015   No reflux seen  Small irreducible hiatal hernia  Mild changes of presbyeophagus (abnormal contractions of the esophagus that occur with aging) No strictures Normal gastric emptying  Incomplete visualization of stomach fold due to patient's inability turn    . GERD (  gastroesophageal reflux disease)   . Heart murmur    has had years and years  . History of shingles Dec 2013   treated with steroids , post op from shoulder surgery  . Hypertension   . Hypothyroidism   . Major depressive disorder, single episode 11/05/2015  . Osteoarthritis of left shoulder 07/14/2012  . Polymyalgia rheumatica (HCC)     Past Surgical History:  Procedure Laterality Date  . ARTHOSCOPIC ROTAOR CUFF REPAIR      LEFT  10+  YEARS    . CATARACT EXTRACTION W/ INTRAOCULAR LENS IMPLANT     RIGHT EYE  . CATARACT EXTRACTION W/PHACO Left 03/28/2016   Procedure: CATARACT EXTRACTION PHACO AND INTRAOCULAR LENS PLACEMENT (IOC);  Surgeon: Leandrew Koyanagi, MD;  Location: [place];  Service: Ophthalmology;  Laterality: Left;  TORIC  . FACELIFT    . FOOT SURGERY     rt foot   TUMOR REMOVED  . JOINT REPLACEMENT Left Dec 2013   shoulder  . LEFT HEART CATH AND CORONARY ANGIOGRAPHY N/A 06/12/2017   Procedure: LEFT HEART CATH AND CORONARY ANGIOGRAPHY;  Surgeon: Minna Merritts, MD;  Location: [facility];  Service: Cardiovascular;  Laterality: N/A;  . SKIN CANCER EXCISION    . TOTAL SHOULDER ARTHROPLASTY  07/14/2012   Procedure: TOTAL SHOULDER ARTHROPLASTY;  Surgeon: Johnny Bridge, MD;  Location: [place];  Service: Orthopedics;  Laterality: Left;    Social History Social History   Tobacco Use  . Smoking status: Former Smoker    Types: Cigarettes    Last attempt to quit: 07/30/1972    Years since quitting: 46.4  . Smokeless tobacco: Never Used  . Tobacco comment: smoked for only a few months  Substance Use Topics  . Alcohol use: Yes    Comment: OCCAS WINE   . Drug use: No    Family History Family History  Problem Relation Age of Onset  . Cancer Daughter        Breast  . Breast cancer Daughter 34  . Cancer Son     Allergies  Allergen Reactions  . Alendronate Swelling    And nausea   . Metoprolol Other (See Comments)    lethargy  . Oxycontin [Oxycodone Hcl]     Altered mental status     REVIEW OF SYSTEMS (Negative unless checked)  Constitutional: [] Weight loss  [] Fever  [] Chills Cardiac: [] Chest pain   [] Chest pressure   [] Palpitations   [] Shortness of breath when laying flat   [] Shortness of breath with exertion. Vascular:  [] Pain in legs with walking   [x] Pain in legs at rest  [] History of DVT   [] Phlebitis   [x] Swelling in legs   [] Varicose veins   [] Non-healing  ulcers Pulmonary:   [] Uses home oxygen   [] Productive cough   [] Hemoptysis   [] Wheeze  [] COPD   [] Asthma Neurologic:  [] Dizziness   [] Seizures   [] History of stroke   [] History of TIA  [] Aphasia   [] Vissual changes   [] Weakness or numbness in arm   [] Weakness or numbness in leg Musculoskeletal:   [] Joint swelling   [x] Joint pain   [] Low back pain Hematologic:  [] Easy bruising  [] Easy bleeding   [] Hypercoagulable state   [] Anemic Gastrointestinal:  [] Diarrhea   [] Vomiting  [x] Gastroesophageal reflux/heartburn   [] Difficulty swallowing. Genitourinary:  [] Chronic kidney disease   [] Difficult urination  [] Frequent urination   [] Blood in urine Skin:  [] Rashes   [] Ulcers  Psychological:  [] History of anxiety   []   History of major depression.  Physical Examination  Vitals:   01/01/19 0809  BP: (!) 159/69  Pulse: 76  Resp: 10  Weight: 155 lb (70.3 kg)  Height: 5\' 6"  (1.676 m)   Body mass index is 25.02 kg/m. Gen: WD/WN, NAD Head: [place]/AT, No temporalis wasting.  Ear/Nose/Throat: Hearing grossly intact, nares w/o erythema or drainage Eyes: PER, EOMI, sclera nonicteric.  Neck: Supple, no large masses.   Pulmonary:  Good air movement, no audible wheezing bilaterally, no use of accessory muscles.  Cardiac: RRR, no JVD Vascular: scattered varicosities present bilaterally.  Mild venous stasis changes to the legs bilaterally.  3+ soft pitting edema Vessel Right Left  Radial Palpable Palpable  PT 1+ Palpable Not Palpable  DP 1+ Palpable Not Palpable  Gastrointestinal: Non-distended. No guarding/no peritoneal signs.  Musculoskeletal: M/S 5/5 throughout.  No deformity or atrophy.  Neurologic: CN 2-12 intact. Symmetrical.  Speech is fluent. Motor exam as listed above. Psychiatric: Judgment intact, Mood & affect appropriate for pt's clinical situation. Dermatologic: No rashes or ulcers noted.  No changes consistent with cellulitis. Lymph : No lichenification or skin changes of chronic lymphedema.   CBC Lab Results  Component Value Date   WBC 9.6 12/03/2018   HGB 11.5 (L) 12/03/2018   HCT 34.8 (L) 12/03/2018   MCV 80.0 12/03/2018   PLT 244 12/03/2018    BMET    Component Value Date/Time   NA 138 12/03/2018 1417   NA 138 06/07/2014   K 4.2 12/03/2018 1417   CL 101 12/03/2018 1417   CO2 26 12/03/2018 1417   GLUCOSE 167 (H) 12/03/2018 1417   BUN 20 12/03/2018 1417   BUN 20 06/07/2014   CREATININE 1.14 (H) 12/03/2018 1417   CALCIUM 9.5 12/03/2018 1417   GFRNONAA >60 11/09/2018 0448   GFRAA >60 11/09/2018 0448   CrCl cannot be calculated (Patient's most recent lab result is older than the maximum 21 days allowed.).  COAG Lab Results  Component Value Date   INR 1.0 11/08/2018   INR 0.98 07/09/2012    Radiology No results found.   Assessment/Plan 1. Renal artery stenosis (HCC) Recommend:  The patient has evidence of severe atherosclerotic changes of the renal artery with worsening of the atherosclerosis of the renal arteries associated with severe hypertension.  This represents a high risk for CVA, MI and renal failure.  Patient should undergo angiography of the left renal artery with the hope for intervention for control of her malignant hypertension.  The risks and benefits as well as the alternative therapies was discussed in detail with the patient.  All questions were answered.  Patient agrees to proceed with left renal angiography.    2. Renovascular hypertension Recommend:  The patient has evidence of severe atherosclerotic changes of the renal artery with worsening of the atherosclerosis of the renal arteries associated with severe hypertension.  This represents a high risk for CVA, MI and renal failure.  Patient should undergo angiography of the left renal artery with the hope for intervention for control of her malignant hypertension.  The risks and benefits as well as the alternative therapies was discussed in detail with the patient.  All questions were  answered.  Patient agrees to proceed with left renal angiography.   3. Lymphedema I have had a long discussion with the patient regarding swelling and why it  causes symptoms.  Patient will begin wearing graduated compression stockings (15-20 mmHg) on a daily basis specifically [employer] brand. The patient will  beginning  wearing the stockings first thing in the morning and removing them in the evening. The patient is instructed specifically not to sleep in the stockings.   In addition, behavioral modification will be initiated.  This will include frequent elevation, use of over the counter pain medications and exercise such as walking.  I have reviewed systemic causes for chronic edema such as liver, kidney and cardiac etiologies.  The patient denies problems with these organ systems.    Consideration for a lymph pump will also be made based upon the effectiveness of conservative therapy.  This would help to improve the edema control and prevent sequela such as ulcers and infections   Patient should undergo duplex ultrasound of the venous system to ensure that DVT or reflux is not present.  The patient will follow-up with me after the ultrasound.    4. PAD (peripheral artery disease) (HCC)  Recommend:  The patient has evidence of atherosclerosis of the lower extremities with claudication.  The patient does not voice lifestyle limiting changes at this point in time.  Noninvasive studies ABI's  No invasive studies, angiography or surgery at this time The patient should continue walking and begin a more formal exercise program.  The patient should continue antiplatelet therapy and aggressive treatment of the lipid abnormalities  No changes in the patient's medications at this time  The patient should continue wearing graduated compression socks 10-15 mmHg strength to control the mild edema.    5. Coronary artery disease involving native coronary artery of native heart without angina  pectoris Continue cardiac and antihypertensive medications as already ordered and reviewed, no changes at this time.  Continue statin as ordered and reviewed, no changes at this time  Nitrates PRN for chest pain   6. Gastroesophageal reflux disease with esophagitis Continue PPI as already ordered, this medication has been reviewed and there are no changes at this time.  Avoidence of caffeine and alcohol  Moderate elevation of the head of the bed     [name], MD  01/01/2019 9:17 AM

## 2019-01-02 ENCOUNTER — Other Ambulatory Visit
Admission: RE | Admit: 2019-01-02 | Discharge: 2019-01-02 | Disposition: A | Discharge status: Home / Self Care | Payer: Medicare Other | Source: Ambulatory Visit | Attending: Vascular Surgery | Admitting: Vascular Surgery

## 2019-01-02 DIAGNOSIS — Z01812 Encounter for preprocedural laboratory examination: Secondary | ICD-10-CM | POA: Insufficient documentation

## 2019-01-02 DIAGNOSIS — Z1159 Encounter for screening for other viral diseases: Secondary | ICD-10-CM | POA: Insufficient documentation

## 2019-01-02 NOTE — Telephone Encounter (Signed)
Patient left a message wanting to know if she could take her Aspirin. I returned the call and had to leave a message to let her know she could and if she had other questions to call back.

## 2019-01-03 LAB — NOVEL CORONAVIRUS, NAA (HOSP ORDER, SEND-OUT TO REF LAB; TAT 18-24 HRS): SARS-CoV-2, NAA: NOT DETECTED

## 2019-01-05 ENCOUNTER — Other Ambulatory Visit: Payer: Medicare Other

## 2019-01-05 ENCOUNTER — Other Ambulatory Visit (INDEPENDENT_AMBULATORY_CARE_PROVIDER_SITE_OTHER): Payer: Medicare Other

## 2019-01-05 ENCOUNTER — Other Ambulatory Visit: Payer: Self-pay

## 2019-01-05 DIAGNOSIS — D649 Anemia, unspecified: Secondary | ICD-10-CM

## 2019-01-05 LAB — CBC WITH DIFFERENTIAL/PLATELET
Basophils Absolute: 0 10*3/uL (ref 0.0–0.1)
Basophils Relative: 0.4 % (ref 0.0–3.0)
Eosinophils Absolute: 0.1 10*3/uL (ref 0.0–0.7)
Eosinophils Relative: 0.9 % (ref 0.0–5.0)
HCT: 39 % (ref 36.0–46.0)
Hemoglobin: 12.6 g/dL (ref 12.0–15.0)
Lymphocytes Relative: 29.5 % (ref 12.0–46.0)
Lymphs Abs: 2.3 10*3/uL (ref 0.7–4.0)
MCHC: 32.3 g/dL (ref 30.0–36.0)
MCV: 84.2 fl (ref 78.0–100.0)
Monocytes Absolute: 0.8 10*3/uL (ref 0.1–1.0)
Monocytes Relative: 10.5 % (ref 3.0–12.0)
Neutro Abs: 4.5 10*3/uL (ref 1.4–7.7)
Neutrophils Relative %: 58.7 % (ref 43.0–77.0)
Platelets: 206 10*3/uL (ref 150.0–400.0)
RBC: 4.64 Mil/uL (ref 3.87–5.11)
RDW: 19.5 % — ABNORMAL HIGH (ref 11.5–15.5)
WBC: 7.7 10*3/uL (ref 4.0–10.5)

## 2019-01-06 MED ORDER — CEFAZOLIN SODIUM-DEXTROSE 1-4 GM/50ML-% IV SOLN
1.0000 g | Freq: Once | INTRAVENOUS | Status: AC
  Administered 2019-01-07: 1 g via INTRAVENOUS

## 2019-01-07 ENCOUNTER — Encounter: Payer: Self-pay | Admitting: *Deleted

## 2019-01-07 ENCOUNTER — Other Ambulatory Visit: Payer: Self-pay

## 2019-01-07 ENCOUNTER — Encounter
Admission: RE | Disposition: A | Discharge status: Home / Self Care | Payer: Self-pay | Source: Home / Self Care | Attending: Vascular Surgery

## 2019-01-07 ENCOUNTER — Ambulatory Visit
Admission: RE | Admit: 2019-01-07 | Discharge: 2019-01-07 | Disposition: A | Discharge status: Home / Self Care | Payer: Medicare Other | Attending: Vascular Surgery | Admitting: Vascular Surgery

## 2019-01-07 DIAGNOSIS — K210 Gastro-esophageal reflux disease with esophagitis: Secondary | ICD-10-CM | POA: Diagnosis not present

## 2019-01-07 DIAGNOSIS — I150 Renovascular hypertension: Secondary | ICD-10-CM | POA: Insufficient documentation

## 2019-01-07 DIAGNOSIS — Z96612 Presence of left artificial shoulder joint: Secondary | ICD-10-CM | POA: Diagnosis not present

## 2019-01-07 DIAGNOSIS — E039 Hypothyroidism, unspecified: Secondary | ICD-10-CM | POA: Diagnosis not present

## 2019-01-07 DIAGNOSIS — I890 Lymphedema, not elsewhere classified: Secondary | ICD-10-CM | POA: Diagnosis not present

## 2019-01-07 DIAGNOSIS — M19012 Primary osteoarthritis, left shoulder: Secondary | ICD-10-CM | POA: Diagnosis not present

## 2019-01-07 DIAGNOSIS — I2510 Atherosclerotic heart disease of native coronary artery without angina pectoris: Secondary | ICD-10-CM | POA: Insufficient documentation

## 2019-01-07 DIAGNOSIS — Z87891 Personal history of nicotine dependence: Secondary | ICD-10-CM | POA: Insufficient documentation

## 2019-01-07 DIAGNOSIS — I739 Peripheral vascular disease, unspecified: Secondary | ICD-10-CM | POA: Diagnosis not present

## 2019-01-07 DIAGNOSIS — I70219 Atherosclerosis of native arteries of extremities with intermittent claudication, unspecified extremity: Secondary | ICD-10-CM

## 2019-01-07 DIAGNOSIS — I701 Atherosclerosis of renal artery: Secondary | ICD-10-CM | POA: Diagnosis not present

## 2019-01-07 DIAGNOSIS — Z8619 Personal history of other infectious and parasitic diseases: Secondary | ICD-10-CM | POA: Insufficient documentation

## 2019-01-07 DIAGNOSIS — Z885 Allergy status to narcotic agent status: Secondary | ICD-10-CM | POA: Diagnosis not present

## 2019-01-07 DIAGNOSIS — Z888 Allergy status to other drugs, medicaments and biological substances status: Secondary | ICD-10-CM | POA: Diagnosis not present

## 2019-01-07 HISTORY — PX: RENAL ANGIOGRAPHY: CATH118260

## 2019-01-07 LAB — BASIC METABOLIC PANEL
Anion gap: 6 (ref 5–15)
BUN: 22 mg/dL (ref 8–23)
CO2: 24 mmol/L (ref 22–32)
Calcium: 9.2 mg/dL (ref 8.9–10.3)
Chloride: 108 mmol/L (ref 98–111)
Creatinine, Ser: 0.84 mg/dL (ref 0.44–1.00)
GFR calc Af Amer: >60 mL/min (ref >60)
GFR calc non Af Amer: >60 mL/min (ref >60)
Glucose, Bld: 102 mg/dL — ABNORMAL HIGH (ref 70–99)
Potassium: 4 mmol/L (ref 3.5–5.1)
Sodium: 138 mmol/L (ref 135–145)

## 2019-01-07 SURGERY — RENAL ANGIOGRAPHY
Anesthesia: Moderate Sedation | Laterality: Left

## 2019-01-07 SURGERY — LOWER EXTREMITY ANGIOGRAPHY
Anesthesia: Moderate Sedation | Laterality: Left

## 2019-01-07 MED ORDER — IODIXANOL 320 MG/ML IV SOLN
INTRAVENOUS | Status: DC | PRN
  Administered 2019-01-07: 50 mL via INTRAVENOUS

## 2019-01-07 MED ORDER — SODIUM CHLORIDE 0.9% FLUSH: 3.0000 mL | Freq: Two times a day (BID) | INTRAVENOUS | Status: DC

## 2019-01-07 MED ORDER — MORPHINE SULFATE (PF) 4 MG/ML IV SOLN: 2.0000 mg | INTRAVENOUS | Status: DC | PRN

## 2019-01-07 MED ORDER — ACETAMINOPHEN 325 MG PO TABS: 650.0000 mg | ORAL_TABLET | ORAL | Status: DC | PRN

## 2019-01-07 MED ORDER — ATORVASTATIN CALCIUM 10 MG PO TABS: 10.0000 mg | ORAL_TABLET | Freq: Every day | ORAL | 0 refills | Status: DC

## 2019-01-07 MED ORDER — SODIUM CHLORIDE 0.9 % IV SOLN: INTRAVENOUS | Status: DC

## 2019-01-07 MED ORDER — HEPARIN SODIUM (PORCINE) 1000 UNIT/ML IJ SOLN
INTRAMUSCULAR | Status: AC
  Filled 2019-01-07: qty 1

## 2019-01-07 MED ORDER — FENTANYL CITRATE (PF) 100 MCG/2ML IJ SOLN
INTRAMUSCULAR | Status: DC | PRN
  Administered 2019-01-07: 12.5 ug via INTRAVENOUS
  Administered 2019-01-07: 50 ug via INTRAVENOUS
  Administered 2019-01-07: 25 ug via INTRAVENOUS

## 2019-01-07 MED ORDER — HEPARIN (PORCINE) IN NACL 1000-0.9 UT/500ML-% IV SOLN
INTRAVENOUS | Status: AC
  Filled 2019-01-07: qty 1000

## 2019-01-07 MED ORDER — LABETALOL HCL 5 MG/ML IV SOLN: 10.0000 mg | INTRAVENOUS | Status: DC | PRN

## 2019-01-07 MED ORDER — SODIUM CHLORIDE 0.9% FLUSH: 3.0000 mL | INTRAVENOUS | Status: DC | PRN

## 2019-01-07 MED ORDER — SODIUM CHLORIDE 0.9 % IV SOLN
INTRAVENOUS | Status: DC
  Administered 2019-01-07: 08:00:00 via INTRAVENOUS

## 2019-01-07 MED ORDER — MIDAZOLAM HCL 5 MG/5ML IJ SOLN
INTRAMUSCULAR | Status: AC
  Filled 2019-01-07: qty 5

## 2019-01-07 MED ORDER — CLOPIDOGREL BISULFATE 75 MG PO TABS
150.0000 mg | ORAL_TABLET | Freq: Once | ORAL | Status: AC
  Administered 2019-01-07: 150 mg via ORAL

## 2019-01-07 MED ORDER — FENTANYL CITRATE (PF) 100 MCG/2ML IJ SOLN
INTRAMUSCULAR | Status: AC
  Filled 2019-01-07: qty 2

## 2019-01-07 MED ORDER — CEFAZOLIN SODIUM-DEXTROSE 1-4 GM/50ML-% IV SOLN
INTRAVENOUS | Status: AC
  Filled 2019-01-07: qty 50

## 2019-01-07 MED ORDER — CLOPIDOGREL BISULFATE 75 MG PO TABS
ORAL_TABLET | ORAL | Status: AC
  Filled 2019-01-07: qty 2

## 2019-01-07 MED ORDER — ONDANSETRON HCL 4 MG/2ML IJ SOLN: 4.0000 mg | Freq: Four times a day (QID) | INTRAMUSCULAR | Status: DC | PRN

## 2019-01-07 MED ORDER — LIDOCAINE HCL (PF) 1 % IJ SOLN
INTRAMUSCULAR | Status: AC
  Filled 2019-01-07: qty 30

## 2019-01-07 MED ORDER — CLOPIDOGREL BISULFATE 75 MG PO TABS: 75.0000 mg | ORAL_TABLET | Freq: Every day | ORAL | 4 refills | Status: DC

## 2019-01-07 MED ORDER — SODIUM CHLORIDE 0.9 % IV SOLN: 250.0000 mL | INTRAVENOUS | Status: DC | PRN

## 2019-01-07 MED ORDER — MIDAZOLAM HCL 2 MG/2ML IJ SOLN
INTRAMUSCULAR | Status: DC | PRN
  Administered 2019-01-07 (×2): 1 mg via INTRAVENOUS
  Administered 2019-01-07: 2 mg via INTRAVENOUS

## 2019-01-07 MED ORDER — HEPARIN SODIUM (PORCINE) 1000 UNIT/ML IJ SOLN
INTRAMUSCULAR | Status: DC | PRN
  Administered 2019-01-07: 4000 [IU] via INTRAVENOUS

## 2019-01-07 MED ORDER — HYDRALAZINE HCL 20 MG/ML IJ SOLN: 5.0000 mg | INTRAMUSCULAR | Status: DC | PRN

## 2019-01-07 SURGICAL SUPPLY — 15 items
CATH BEACON 5 .035 65 C2 TIP (CATHETERS) ×3 IMPLANT
CATH PIG 70CM (CATHETERS) ×3 IMPLANT
COVER PROBE U/S 5X48 (MISCELLANEOUS) ×6 IMPLANT
DEVICE PRESTO INFLATION (MISCELLANEOUS) ×3 IMPLANT
DEVICE STARCLOSE SE CLOSURE (Vascular Products) ×3 IMPLANT
GUIDEWIRE ANGLED .035 180CM (WIRE) ×3 IMPLANT
NEEDLE ENTRY 21GA 7CM ECHOTIP (NEEDLE) ×3 IMPLANT
PACK ANGIOGRAPHY (CUSTOM PROCEDURE TRAY) ×3 IMPLANT
SET INTRO CAPELLA COAXIAL (SET/KITS/TRAYS/PACK) ×3 IMPLANT
SHEATH ANL2 6FRX45 HC (SHEATH) ×3 IMPLANT
SHEATH BRITE TIP 5FRX11 (SHEATH) ×3 IMPLANT
STENT LIFESTREAM 6X16X80 (Permanent Stent) ×3 IMPLANT
TUBING CONTRAST HIGH PRESS 72 (TUBING) ×3 IMPLANT
WIRE J 3MM .035X145CM (WIRE) ×3 IMPLANT
WIRE MAGIC TOR.035 180C (WIRE) ×3 IMPLANT

## 2019-01-07 NOTE — Progress Notes (Signed)
Verbal order Dr. Delana Meyer to obtain consent for Right Renal angiogram and stent placement. Cathy RN in [employer] notified and calling Pt Daughter. Pt sedated at this time.

## 2019-01-07 NOTE — H&P (Signed)
[facility] History & Physical Update  The patient was interviewed and re-examined.  The patient's previous History and Physical has been reviewed and is unchanged.  There is no change in the plan of care. We plan to proceed with the scheduled procedure.  [name], MD  01/07/2019, 7:59 AM

## 2019-01-07 NOTE — Discharge Instructions (Signed)

## 2019-01-07 NOTE — Op Note (Signed)
[facility] Percutaneous Study/Intervention Procedural Note    Surgeon(s): Nurse, children's: None  Pre-operative Diagnosis: left renal artery stenosis, renovascular hypertension  Post-operative diagnosis: Right renal artery stenosis, renovascular hypertension   Procedure(s) Performed: 1. Ultrasound guidance for vascular access right femoral artery 2. Catheter placement into right renal artery from right femoral approach 3. Aortogram and selective right renal angiogram 4. Balloon expandable stent placement to right renal artery with a 6 mm diameter x 18 mm length lifestream stent 5. StarClose closure device right femoral artery  Contrast: 40 cc  Fluoro Time: 14.9 minutes  Moderate conscious sedation: Approximately 45 minutes of conscious sedation was administered by the interventional radiology nurse.  The patient was continually monitored throughout the entire procedure via pulse oximetry EKG and blood pressure monitoring.  Indications: The patient is an 85 year old with worsening severe hypertension despite multiple antihypertensives. The patient has suboptimal blood pressure control despite multiple antihypertensives and a noninvasive study demonstrating hemodynamically significant left renal artery stenosis. Given the clinical scenario and the noninvasive findings, angiogram is indicated for further evaluation of her renal artery and potential treatment. Risks and benefits are discussed and informed consent is obtained.  Procedure: The patient was identified and appropriate procedural time out was performed. The patient was then placed supine on the table and prepped and draped in the usual sterile fashion.Moderate conscious sedation was administered with a face to face encounter with the patient throughout the procedure with my supervision of the RN administering  medicines and monitoring the patients vital signs and mental status throughout from the start of the procedure until the patient was taken to the recovery room  Ultrasound was used to evaluate the right common femoral artery. It was patent . A digital ultrasound image was acquired. A micropuncture needle was used to access the right common femoral artery under direct ultrasound guidance and a permanent image was performed. A microwire followed by microcatheter was then placed.  A 0.035 J wire was advanced without resistance and a 5Fr sheath was placed. Pigtail catheter was placed into the aorta at the L1 level and an AP aortogram was performed. This demonstrated the right renal artery had a greater than 80% proximal stenosis.  Left renal artery has an acute angle and a downgoing fashion but no hemodynamically significant lesion is identified.  Left renal artery is widely patent.. The patient was then systemically heparinized with 4000 units of intravenous heparin. I used a C2 catheter to cannulate the right renal artery and selective imaging was performed. This confirmed a greater than 80% stenosis of the right renal artery.  At this point I selected the Magic torque wire and crossed the lesion without difficulty.   I then selected a 6 mm diameter x 18 mm length balloon expandable stent, lifestream, and brought this across the lesion.  This was deployed encompassing the lesion with its proximal extent going back into the aorta for a mm or two.  This was inflated to 14 ATM and the waist resolved.  Completion angiogram showed widely patent stent in excellent position protruding into the aorta by approximately 2 to 3 mm.  There is preservation of the distal flow..   The guide catheter was removed. Oblique arteriogram was performed of the right femoral artery and StarClose closure device was deployed in the usual fashion with excellent hemostatic result. The patient was taken to the recovery room in stable  condition having tolerated the procedure well.  Findings:  Aortogram/Renal Arteries:The abdominal aorta  is opacified with a bolus injection contrast.  There are no hemodynamically significant lesions noted in the aorta and proximal iliac system.  Left renal artery is widely patent very sharply angulated downgoing but there is no stenosis noted.  Right renal artery is more transverse and demonstrates a greater than 80% stenosis in its proximal segment.  Following angioplasty and stenting to 6 mm with a lifestream stent there is now less than 5% residual stenosis with rapid flow of contrast through the renal artery and preserved filling of the distal renal vasculature.   Condition:  Stable  Complications: None   Kathryn Wise 01/07/2019 10:53 AM  This note was created with Dragon Medical transcription system. Any errors in dictation are purely unintentional.

## 2019-01-08 ENCOUNTER — Encounter: Payer: Self-pay | Admitting: Vascular Surgery

## 2019-01-14 DIAGNOSIS — H61322 Acquired stenosis of left external ear canal secondary to inflammation and infection: Secondary | ICD-10-CM | POA: Diagnosis not present

## 2019-01-17 DIAGNOSIS — R0982 Postnasal drip: Secondary | ICD-10-CM | POA: Diagnosis not present

## 2019-01-17 DIAGNOSIS — R0981 Nasal congestion: Secondary | ICD-10-CM | POA: Diagnosis not present

## 2019-01-22 ENCOUNTER — Other Ambulatory Visit: Payer: Self-pay

## 2019-01-22 ENCOUNTER — Encounter (INDEPENDENT_AMBULATORY_CARE_PROVIDER_SITE_OTHER): Payer: Self-pay | Admitting: Vascular Surgery

## 2019-01-22 ENCOUNTER — Ambulatory Visit (INDEPENDENT_AMBULATORY_CARE_PROVIDER_SITE_OTHER): Payer: Medicare Other | Admitting: Vascular Surgery

## 2019-01-22 VITALS — BP 135/80 | HR 74 | Resp 16 | Ht 66.0 in | Wt 152.6 lb

## 2019-01-22 DIAGNOSIS — H9192 Unspecified hearing loss, left ear: Secondary | ICD-10-CM | POA: Diagnosis not present

## 2019-01-22 DIAGNOSIS — I2510 Atherosclerotic heart disease of native coronary artery without angina pectoris: Secondary | ICD-10-CM | POA: Diagnosis not present

## 2019-01-22 DIAGNOSIS — Z87891 Personal history of nicotine dependence: Secondary | ICD-10-CM | POA: Diagnosis not present

## 2019-01-22 DIAGNOSIS — K2100 Gastro-esophageal reflux disease with esophagitis, without bleeding: Secondary | ICD-10-CM

## 2019-01-22 DIAGNOSIS — I739 Peripheral vascular disease, unspecified: Secondary | ICD-10-CM

## 2019-01-22 DIAGNOSIS — Z9889 Other specified postprocedural states: Secondary | ICD-10-CM | POA: Diagnosis not present

## 2019-01-22 DIAGNOSIS — I701 Atherosclerosis of renal artery: Secondary | ICD-10-CM

## 2019-01-22 DIAGNOSIS — Z79899 Other long term (current) drug therapy: Secondary | ICD-10-CM

## 2019-01-22 DIAGNOSIS — I150 Renovascular hypertension: Secondary | ICD-10-CM | POA: Diagnosis not present

## 2019-01-23 ENCOUNTER — Telehealth (INDEPENDENT_AMBULATORY_CARE_PROVIDER_SITE_OTHER): Payer: Self-pay

## 2019-01-23 ENCOUNTER — Encounter (INDEPENDENT_AMBULATORY_CARE_PROVIDER_SITE_OTHER): Payer: Self-pay | Admitting: Vascular Surgery

## 2019-01-23 DIAGNOSIS — H905 Unspecified sensorineural hearing loss: Secondary | ICD-10-CM | POA: Insufficient documentation

## 2019-01-23 DIAGNOSIS — H6122 Impacted cerumen, left ear: Secondary | ICD-10-CM | POA: Insufficient documentation

## 2019-01-23 NOTE — Telephone Encounter (Signed)
I told Dr Tami Ribas I want him to see Kathryn Wise and he sent back this: Make sure it's Monday. Don't like to wait for hearing loss evaluation. Kathryn Wise have your nurse call my nurse Kathryn Wise directly to get her in Monday If you would please call over and give Dr. Ileene Hutchinson nurse Kathryn Wise's information I would appreciate i    This message was sent to me from Kathryn Wise through the chat message. Dr. Ival Bible office was called and per the receptionist the patient will be scheduled for Monday with Kathryn Wise.

## 2019-01-23 NOTE — Progress Notes (Signed)
MRN : [id number]  Kathryn Wise is a 85 y.o. (20-Dec-1929) female who presents with chief complaint of  Chief Complaint  Patient presents with   Follow-up    2week follow up  .  History of Present Illness: The patient returns to the office for followup status post right renal artery stenting on 01/07/2019.  No intraoperative problems or complications were noted.  Since the procedure the patient has noted marked improvement in her blood pressure and in fact has been taken completely off amlodipine.  The patient denies headache or flushing.  No flank or unusual back pain.  However, she noted post procedure that she has lost a significant amount of hearing in her left ear.  She denies ear pain.  She was seen at urgent care and antibiotic as well as steroid drops were prescribed but these have not improved her hearing.  No interval shortening of the patient's walking distance or new symptoms consistent with claudication.  The patient denies the  development of rest pain symptoms. No new ulcers or wounds have occurred since the last visit.  The patient denies amaurosis fugax or recent TIA symptoms. There are no recent neurological changes noted. The patient denies history of DVT, PE or superficial thrombophlebitis. The patient denies recent episodes of angina or shortness of breath.    Current Meds  Medication Sig   amLODipine (NORVASC) 10 MG tablet Take 0.5 tablets (5 mg total) by mouth 2 (two) times daily.   aspirin 81 MG tablet Take 81 mg by mouth daily.   atorvastatin (LIPITOR) 10 MG tablet Take 1 tablet (10 mg total) by mouth daily. Further refills per her primary care   Cholecalciferol (VITAMIN D-3) 1000 units CAPS Take 1 capsule by mouth daily.   clopidogrel (PLAVIX) 75 MG tablet Take 1 tablet (75 mg total) by mouth daily.   Cyanocobalamin (VITAMIN B 12) 100 MCG LOZG Take 1 tablet by mouth daily.    ferrous sulfate 325 (65 FE) MG EC tablet Take 325 mg by mouth daily with  breakfast.    levothyroxine (SYNTHROID, LEVOTHROID) 88 MCG tablet TAKE 1 TABLET BY MOUTH ONCE DAILY ON AN EMPTY STOMACH. WAIT 30 MINUTES BEFORE TAKING OTHER MEDS.   Multiple Vitamins-Minerals (MULTIVITAMIN WITH MINERALS) tablet Take 1 tablet by mouth daily.   nitroGLYCERIN (NITROSTAT) 0.4 MG SL tablet Place 1 tablet (0.4 mg total) every 5 (five) minutes as needed under the tongue for chest pain.   Potassium 99 MG TABS Take 99 mg by mouth daily.    RESTASIS 0.05 % ophthalmic emulsion Place 1 drop into both eyes 2 (two) times daily.    temazepam (RESTORIL) 15 MG capsule TAKE 1 CAPSULE BY MOUTH AT BEDTIME AS NEEDED SLEEP (Patient taking differently: Take 15 mg by mouth at bedtime as needed for sleep. )    Past Medical History:  Diagnosis Date   Arthritis    Dysphagia, pharyngoesophageal phase 09/10/2013   Upper GI study with barium swallow was done at  [facility] Mar 2015   No reflux seen  Small irreducible hiatal hernia  Mild changes of presbyeophagus (abnormal contractions of the esophagus that occur with aging) No strictures Normal gastric emptying  Incomplete visualization of stomach fold due to patient's inability turn     GERD (gastroesophageal reflux disease)    Heart murmur    has had years and years   History of shingles Dec 2013   treated with steroids , post op from shoulder surgery   Hypertension  Hypothyroidism    Major depressive disorder, single episode 11/05/2015   Osteoarthritis of left shoulder 07/14/2012   Polymyalgia rheumatica ([place])     Past Surgical History:  Procedure Laterality Date   ARTHOSCOPIC ROTAOR CUFF REPAIR     LEFT  10+  YEARS     CATARACT EXTRACTION W/ INTRAOCULAR LENS IMPLANT     RIGHT EYE   CATARACT EXTRACTION W/PHACO Left 03/28/2016   Procedure: CATARACT EXTRACTION PHACO AND INTRAOCULAR LENS PLACEMENT ([place]);  Surgeon: Leandrew Koyanagi, MD;  Location: [place];  Service: Ophthalmology;  Laterality: Left;  TORIC   FACELIFT       FOOT SURGERY     rt foot   TUMOR REMOVED   JOINT REPLACEMENT Left Dec 2013   shoulder   LEFT HEART CATH AND CORONARY ANGIOGRAPHY N/A 06/12/2017   Procedure: LEFT HEART CATH AND CORONARY ANGIOGRAPHY;  Surgeon: Minna Merritts, MD;  Location: [place] CV LAB;  Service: Cardiovascular;  Laterality: N/A;   RENAL ANGIOGRAPHY Left 01/07/2019   Procedure: RENAL ANGIOGRAPHY;  Surgeon: Katha Cabal, MD;  Location: [place] CV LAB;  Service: Cardiovascular;  Laterality: Left;   SKIN CANCER EXCISION     TOTAL SHOULDER ARTHROPLASTY  07/14/2012   Procedure: TOTAL SHOULDER ARTHROPLASTY;  Surgeon: Johnny Bridge, MD;  Location: [place];  Service: Orthopedics;  Laterality: Left;    Social History Social History   Tobacco Use   Smoking status: Former Smoker    Types: Cigarettes    Quit date: 07/30/1972    Years since quitting: 46.5   Smokeless tobacco: Never Used   Tobacco comment: smoked for only a few months  Substance Use Topics   Alcohol use: Yes    Comment: OCCAS WINE    Drug use: No    Family History Family History  Problem Relation Age of Onset   Cancer Daughter        Breast   Breast cancer Daughter 35   Cancer Son     Allergies  Allergen Reactions   Alendronate Swelling    And nausea    Metoprolol Other (See Comments)    lethargy   Oxycontin [Oxycodone Hcl]     Altered mental status     REVIEW OF SYSTEMS (Negative unless checked)  Constitutional: [] Weight loss  [] Fever  [] Chills Cardiac: [] Chest pain   [] Chest pressure   [] Palpitations   [] Shortness of breath when laying flat   [] Shortness of breath with exertion. Vascular:  [] Pain in legs with walking   [] Pain in legs at rest  [] History of DVT   [] Phlebitis   [] Swelling in legs   [] Varicose veins   [] Non-healing ulcers Pulmonary:   [] Uses home oxygen   [] Productive cough   [] Hemoptysis   [] Wheeze  [] COPD   [] Asthma Neurologic:  [] Dizziness   [] Seizures   [] History of stroke   [] History  of TIA  [] Aphasia   [] Vissual changes   [] Weakness or numbness in arm   [] Weakness or numbness in leg Musculoskeletal:   [] Joint swelling   [] Joint pain   [] Low back pain Hematologic:  [] Easy bruising  [] Easy bleeding   [] Hypercoagulable state   [] Anemic Gastrointestinal:  [] Diarrhea   [] Vomiting  [x] Gastroesophageal reflux/heartburn   [] Difficulty swallowing. Genitourinary:  [] Chronic kidney disease   [] Difficult urination  [] Frequent urination   [] Blood in urine Skin:  [] Rashes   [] Ulcers  Psychological:  [] History of anxiety   []  History of major depression.  Physical Examination  Vitals:   01/22/19  1101  BP: 135/80  Pulse: 74  Resp: 16  Weight: 152 lb 9.6 oz (69.2 kg)  Height: 5\' 6"  (1.676 m)   Body mass index is 24.63 kg/m. Gen: WD/WN, NAD Head: [place]/AT, No temporalis wasting.  Ear/Nose/Throat: Hearing is significantly decreased and I am required to essentially shout in order to carry out my conversation with her, nares w/o erythema or drainage Eyes: PER, EOMI, sclera nonicteric.  Neck: Supple, no large masses.   Pulmonary:  Good air movement, no audible wheezing bilaterally, no use of accessory muscles.  Cardiac: RRR, no JVD Vascular:  Vessel Right Left  Radial Palpable Palpable  PT Palpable Palpable  DP Palpable Palpable  Gastrointestinal: Non-distended. No guarding/no peritoneal signs.  Musculoskeletal: M/S 5/5 throughout.  No deformity or atrophy.  Neurologic: CN 2-12 intact. Symmetrical.  Speech is fluent. Motor exam as listed above. Psychiatric: Judgment intact, Mood & affect appropriate for pt's clinical situation. Dermatologic: No rashes or ulcers noted.  No changes consistent with cellulitis. Lymph : No lichenification or skin changes of chronic lymphedema.  CBC Lab Results  Component Value Date   WBC 7.7 01/05/2019   HGB 12.6 01/05/2019   HCT 39.0 01/05/2019   MCV 84.2 01/05/2019   PLT 206.0 01/05/2019    BMET    Component Value Date/Time   NA 138  01/07/2019 0812   NA 138 06/07/2014   K 4.0 01/07/2019 0812   CL 108 01/07/2019 0812   CO2 24 01/07/2019 0812   GLUCOSE 102 (H) 01/07/2019 0812   BUN 22 01/07/2019 0812   BUN 20 06/07/2014   CREATININE 0.84 01/07/2019 0812   CREATININE 1.14 (H) 12/03/2018 1417   CALCIUM 9.2 01/07/2019 0812   GFRNONAA >60 01/07/2019 0812   GFRAA >60 01/07/2019 6712   Estimated Creatinine Clearance: 45 mL/min (by C-G formula based on SCr of 0.84 mg/dL).  COAG Lab Results  Component Value Date   INR 1.0 11/08/2018   INR 0.98 07/09/2012    Radiology Vas US Renal Artery Duplex  Result Date: 01/01/2019 ABDOMINAL VISCERAL Indications: New medication initiated HTN now under control Comparison Study: none Performing Technologist: Concha Norway RVT  Examination Guidelines: A complete evaluation includes B-mode imaging, spectral Doppler, color Doppler, and power Doppler as needed of all accessible portions of each vessel. Bilateral testing is considered an integral part of a complete examination. Limited examinations for reoccurring indications may be performed as noted.  Duplex Findings: +----------+--------+--------+------+--------+  Mesenteric PSV cm/s EDV cm/s Plaque Comments  +----------+--------+--------+------+--------+  Aorta Mid     92                              +----------+--------+--------+------+--------+  +------------------+--------+--------+-------+  Right Renal Artery PSV cm/s EDV cm/s Comment  +------------------+--------+--------+-------+  Proximal             174                      +------------------+--------+--------+-------+  Mid                  119                      +------------------+--------+--------+-------+  Distal                95                      +------------------+--------+--------+-------+ +-----------------+--------+--------+-------+  Left Renal Artery PSV cm/s EDV cm/s Comment  +-----------------+--------+--------+-------+  Proximal            262                       +-----------------+--------+--------+-------+  Mid                  90                      +-----------------+--------+--------+-------+  Distal               71                      +-----------------+--------+--------+-------+ +------------+--------+--------+----+-----------+--------+--------+----+  Right Kidney PSV cm/s EDV cm/s RI   Left Kidney PSV cm/s EDV cm/s RI    +------------+--------+--------+----+-----------+--------+--------+----+  Upper Pole                          Upper Pole                          +------------+--------+--------+----+-----------+--------+--------+----+  Mid          25       8        0.70 Mid         49       12       0.76  +------------+--------+--------+----+-----------+--------+--------+----+  Lower Pole                          Lower Pole                          +------------+--------+--------+----+-----------+--------+--------+----+  Hilar                               Hilar                               +------------+--------+--------+----+-----------+--------+--------+----+ +------------------+----+------------------+----+  Right Kidney            Left Kidney              +------------------+----+------------------+----+  RAR                     RAR                      +------------------+----+------------------+----+  RAR (manual)       1.89 RAR (manual)       2.85  +------------------+----+------------------+----+  Cortex                  Cortex                   +------------------+----+------------------+----+  Cortex thickness        Corex thickness          +------------------+----+------------------+----+  Kidney length (cm) 9.85 Kidney length (cm) 9.62  +------------------+----+------------------+----+  Summary: Renal:  Right: Normal size right kidney. Normal right Resisitive Index.        Normal cortical thickness of right kidney. 1-59% stenosis of        the right renal artery. Milad atherosclerosis of the RA at  the origin. Left:  Normal size of left  kidney. Abnormal left Resisitve Index.        Normal cortical thickness of the left kidney. 1-59% stenosis        of the left renal artery. Upper limits normal RA velocities        at the origin. Mildly abnormal RI. Left kidney appears to        show reduced color flow within the kidney.  *See table(s) above for measurements and observations.  Diagnosing physician: [name] MD  Electronically signed by [name] MD on 01/01/2019 at 4:47:14 PM.    Final      Assessment/Plan 1. Renal artery stenosis (HCC) BP today was improved and she is off Norvasc  Given patient's atherosclerosis and PAD optimal control of the patient's hypertension is important.  The patient's BP support the intervention is patent. No further intervention is indicated at this time.  Therefore the patient  will continue the current medications, no changes at this time.  The primary medical service will continue aggressive antihypertensive therapy as per the AHA guidelines   - VAS US RENAL ARTERY DUPLEX; Future  2. Renovascular hypertension BP today was improved and she is off Norvasc  Given patient's atherosclerosis and PAD optimal control of the patient's hypertension is important.  The patient's BP support the intervention is patent. No further intervention is indicated at this time.  Therefore the patient  will continue the current medications, no changes at this time.  The primary medical service will continue aggressive antihypertensive therapy as per the AHA guidelines   - VAS US RENAL ARTERY DUPLEX; Future  3. Hearing loss of left ear, unspecified hearing loss type I will ask Dr. Tami Ribas to evaluate in my office is facilitating an appointment for this Monday - Ambulatory referral to ENT  4. Coronary artery disease involving native coronary artery of native heart without angina pectoris Continue cardiac and antihypertensive medications as already ordered and reviewed, no changes at this  time.  Continue statin as ordered and reviewed, no changes at this time  Nitrates PRN for chest pain   5. PAD (peripheral artery disease) (HCC)  Recommend:  The patient has evidence of atherosclerosis of the lower extremities with claudication.  The patient does not voice lifestyle limiting changes at this point in time.  Noninvasive studies do not suggest clinically significant change.  No invasive studies, angiography or surgery at this time The patient should continue walking and begin a more formal exercise program.  The patient should continue antiplatelet therapy and aggressive treatment of the lipid abnormalities  No changes in the patient's medications at this time  The patient should continue wearing graduated compression socks 10-15 mmHg strength to control the mild edema.    6. Gastroesophageal reflux disease with esophagitis Continue PPI as already ordered, this medication has been reviewed and there are no changes at this time.  Avoidence of caffeine and alcohol  Moderate elevation of the head of the bed     [name], MD  01/23/2019 10:28 AM

## 2019-01-29 ENCOUNTER — Other Ambulatory Visit: Payer: Self-pay | Admitting: Internal Medicine

## 2019-02-02 ENCOUNTER — Ambulatory Visit: Payer: Self-pay | Admitting: Internal Medicine

## 2019-02-02 ENCOUNTER — Encounter: Payer: Self-pay | Admitting: Internal Medicine

## 2019-02-02 ENCOUNTER — Ambulatory Visit (INDEPENDENT_AMBULATORY_CARE_PROVIDER_SITE_OTHER): Payer: Medicare Other | Admitting: Internal Medicine

## 2019-02-02 ENCOUNTER — Other Ambulatory Visit: Payer: Self-pay

## 2019-02-02 DIAGNOSIS — G4452 New daily persistent headache (NDPH): Secondary | ICD-10-CM | POA: Diagnosis not present

## 2019-02-02 DIAGNOSIS — I150 Renovascular hypertension: Secondary | ICD-10-CM | POA: Diagnosis not present

## 2019-02-02 DIAGNOSIS — E034 Atrophy of thyroid (acquired): Secondary | ICD-10-CM

## 2019-02-02 DIAGNOSIS — M353 Polymyalgia rheumatica: Secondary | ICD-10-CM

## 2019-02-02 NOTE — Progress Notes (Signed)
Telephone  Note  This visit type was conducted due to national recommendations for restrictions regarding the COVID-19 pandemic (e.g. social distancing).  This format is felt to be most appropriate for this patient at this time.  All issues noted in this document were discussed and addressed.  No physical exam was performed (except for noted visual exam findings with Video Visits).   I connected with@ on 02/02/19 at  4:00 PM EDT by  telephone and verified that I am speaking with the correct person using two identifiers. Location patient: home Location provider: work or home office Persons participating in the virtual visit: patient, provider  I discussed the limitations, risks, security and privacy concerns of performing an evaluation and management service by telephone and the availability of in person appointments. I also discussed with the patient that there may be a patient responsible charge related to this service. The patient expressed understanding and agreed to proceed.  Reason for visit: elevated blood pressure,  Headaches,  Hearing loss , anxiety, PMR  HPI:  85 yr old female with labile hypertension/renal artery stenosis, history of PMR presents with 3 week history of occipital headaches and hearing loss, which ,  she states began prior to her renal artery stent that was done on Jun 10. She reports a daily headache and an unintentional weight loss of 6 lbs since her procedure due to alterations in sense of taste . (COVID 19 testing on June 6 was negative) .  Her hypertension transiently resolved after angioplasty and stenting of her right renal artery by Ella Jubilee on Jun 10 , and she was advised to suspend amlodipine.  In the interim she has had an urgent care evaluation of her ears /hearing loss and was treated for otitis externa (although the urgent care exam documented normal appearing  ear canals).  She has not seen ENT yet despite Dr Nino Parsley attempts to make an urgent appt with  Dr Tami Ribas for last Monday, but patient states that she did not receive the call until too late in the day, and is scheduled to see his colleague on Thursday of this week.     last evening her blood pressure was 198/90, and did not improve after taking 5 mg amlodipine, but reportedly  improved after taking 2 tylenol and an anxiety pill.  She has resumed daily amlodipine and frequent tylenol (2 every 4 hours ) for the past 24 hours and states that her bp is back down to normal and her headache is better. This morning her bp has been < 140/80 on two readings.    ROS: See pertinent positives and negatives per HPI.  Past Medical History:  Diagnosis Date  . Arthritis   . Dysphagia, pharyngoesophageal phase 09/10/2013   Upper GI study with barium swallow was done at  [facility] Mar 2015   No reflux seen  Small irreducible hiatal hernia  Mild changes of presbyeophagus (abnormal contractions of the esophagus that occur with aging) No strictures Normal gastric emptying  Incomplete visualization of stomach fold due to patient's inability turn    . GERD (gastroesophageal reflux disease)   . Heart murmur    has had years and years  . History of shingles Dec 2013   treated with steroids , post op from shoulder surgery  . Hypertension   . Hypothyroidism   . Major depressive disorder, single episode 11/05/2015  . Osteoarthritis of left shoulder 07/14/2012  . Polymyalgia rheumatica (HCC)     Past Surgical History:  Procedure  Laterality Date  . ARTHOSCOPIC ROTAOR CUFF REPAIR     LEFT  10+  YEARS    . CATARACT EXTRACTION W/ INTRAOCULAR LENS IMPLANT     RIGHT EYE  . CATARACT EXTRACTION W/PHACO Left 03/28/2016   Procedure: CATARACT EXTRACTION PHACO AND INTRAOCULAR LENS PLACEMENT ([facility]);  Surgeon: Leandrew Koyanagi, MD;  Location: [place];  Service: Ophthalmology;  Laterality: Left;  TORIC  . FACELIFT    . FOOT SURGERY     rt foot   TUMOR REMOVED  . JOINT REPLACEMENT Left Dec 2013   shoulder  .  LEFT HEART CATH AND CORONARY ANGIOGRAPHY N/A 06/12/2017   Procedure: LEFT HEART CATH AND CORONARY ANGIOGRAPHY;  Surgeon: Minna Merritts, MD;  Location: [place] CV LAB;  Service: Cardiovascular;  Laterality: N/A;  . RENAL ANGIOGRAPHY Left 01/07/2019   Procedure: RENAL ANGIOGRAPHY;  Surgeon: Katha Cabal, MD;  Location: [place] CV LAB;  Service: Cardiovascular;  Laterality: Left;  . SKIN CANCER EXCISION    . TOTAL SHOULDER ARTHROPLASTY  07/14/2012   Procedure: TOTAL SHOULDER ARTHROPLASTY;  Surgeon: Johnny Bridge, MD;  Location: [place];  Service: Orthopedics;  Laterality: Left;    Family History  Problem Relation Age of Onset  . Cancer Daughter        Breast  . Breast cancer Daughter 33  . Cancer Son     SOCIAL HX:  reports that she quit smoking about 46 years ago. Her smoking use included cigarettes. She has never used smokeless tobacco. She reports current alcohol use. She reports that she does not use drugs.   Current Outpatient Medications:  .  amLODipine (NORVASC) 10 MG tablet, Take 0.5 tablets (5 mg total) by mouth 2 (two) times daily., Disp: 90 tablet, Rfl: 0 .  aspirin 81 MG tablet, Take 81 mg by mouth daily., Disp: , Rfl:  .  atorvastatin (LIPITOR) 10 MG tablet, Take 1 tablet (10 mg total) by mouth daily. Further refills per her primary care, Disp: 30 tablet, Rfl: 0 .  Cholecalciferol (VITAMIN D-3) 1000 units CAPS, Take 1 capsule by mouth daily., Disp: , Rfl:  .  clopidogrel (PLAVIX) 75 MG tablet, Take 1 tablet (75 mg total) by mouth daily., Disp: 30 tablet, Rfl: 4 .  Cyanocobalamin (VITAMIN B 12) 100 MCG LOZG, Take 1 tablet by mouth daily. , Disp: , Rfl:  .  ferrous sulfate 325 (65 FE) MG EC tablet, Take 325 mg by mouth daily with breakfast. , Disp: , Rfl:  .  furosemide (LASIX) 20 MG tablet, Take 1 tablet (20 mg total) by mouth daily., Disp: 90 tablet, Rfl: 1 .  levothyroxine (SYNTHROID) 88 MCG tablet, TAKE 1 TABLET BY MOUTH ONCE DAILY ON AN EMPTY STOMACH.  WAIT 30 MINUTES BEFORE TAKING OTHER MEDS., Disp: 90 tablet, Rfl: 0 .  Multiple Vitamins-Minerals (MULTIVITAMIN WITH MINERALS) tablet, Take 1 tablet by mouth daily., Disp: , Rfl:  .  nitroGLYCERIN (NITROSTAT) 0.4 MG SL tablet, Place 1 tablet (0.4 mg total) every 5 (five) minutes as needed under the tongue for chest pain., Disp: 30 tablet, Rfl: 2 .  Potassium 99 MG TABS, Take 99 mg by mouth daily. , Disp: , Rfl:  .  RESTASIS 0.05 % ophthalmic emulsion, Place 1 drop into both eyes 2 (two) times daily. , Disp: , Rfl:  .  temazepam (RESTORIL) 15 MG capsule, TAKE 1 CAPSULE BY MOUTH AT BEDTIME AS NEEDED SLEEP (Patient taking differently: Take 15 mg by mouth at bedtime as needed for sleep. ),  Disp: 30 capsule, Rfl: 5  EXAM:   General impression: alert, cooperative and articulate.  No signs of being in distress  Lungs: speech is fluent sentence length suggests that patient is not short of breath and not punctuated by cough, sneezing or sniffing. [name]   Psych: affect normal.  speech is articulate and non pressured .  Denies suicidal thoughts    ASSESSMENT AND PLAN:  Discussed the following assessment and plan:  Polymyalgia rheumatica Diagnosed in the past by rheumatology.  ESRs have been repeatedly normal.  Given her new onset daily headache and bilateral hearing loss for the past 3 weeks ,  She has been advised to repeat labs ASAP  and will be sent to Dr Delana Meyer for temporal artery biopsy if ESR is elevated   Hypertension Transiently resolved with stenting of 80% occluded right renal artery .  Resume amlodipine until current new issues can be sorted out   New daily persistent headache Per patient, improving with tylenol.  ESR ordered to rule out arteritis     I discussed the assessment and treatment plan with the patient. The patient was provided an opportunity to ask questions and all were answered. The patient agreed with the plan and demonstrated an understanding of the instructions.   The  patient was advised to call back or seek an in-person evaluation if the symptoms worsen or if the condition fails to improve as anticipated.  I provided 30 minutes of non-face-to-face time during this encounter.   [name], MD

## 2019-02-02 NOTE — Telephone Encounter (Signed)
Called and spoke to patient.  Patient said that she has a phone visit scheduled with Dr. Derrel Nip for today at 4:00 pm.   Patient said that she just took her bp and it was 133/62.  Patient said that she hasn't taken her bp meds yet but is about to take them this morning at 9:00 am.  Patient says that her bp is back down and she is fine right now and believes her symptoms are coming from her medical condition of polymyalgia rheumatica. Patient said that she did not want to go back to the ED because they don't how to deal with her medical condition.  Patient has a phone visit scheduled today with PCP at 4:00 pm.

## 2019-02-02 NOTE — Progress Notes (Signed)
Last night blood pressure was 198/90 and that was after taking the blood pressure medication so the pt took two tylenol and an anxiety pill. This morning blood pressure was 166/72 so pt took two more tylenol instead of her blood pressure medication and the blood pressure came down to 133/68. Pt stated that she did finally take her morning blood pressure medication about 9am and the blood pressure at noon was 136/72. Pt stated that her blood pressure has been fine since but that she is taking two tylenol every 4 hours.   Pt is also concerned about her hearing loss and pressure in her head that she thinks is coming from [facility].

## 2019-02-02 NOTE — Telephone Encounter (Signed)
Pt.'s daughter reports pt.'s BP was elevated last night and this morning - 193/83 last night and 163/80 this morning. Daughter reports pt. Has arteritis as well and thinks this could be going on. Has had some hearing loss and blurry vision, decreased taste. x 3 weeks.Pt. has a phone visit today per Caryl Pina. Answer Assessment - Initial Assessment Questions 1. BLOOD PRESSURE: "What is the blood pressure?" "Did you take at least two measurements 5 minutes apart?"     193/83 last night   163/80 this morning 2. ONSET: "When did you take your blood pressure?"     This morning 3. HOW: "How did you obtain the blood pressure?" (e.g., visiting nurse, automatic home BP monitor)     Home monitor 4. HISTORY: "Do you have a history of high blood pressure?"     Yes 5. MEDICATIONS: "Are you taking any medications for blood pressure?" "Have you missed any doses recently?"     No 6. OTHER SYMPTOMS: "Do you have any symptoms?" (e.g., headache, chest pain, blurred vision, difficulty breathing, weakness)     Hearing loss, vision changes x 3 weeks 7. PREGNANCY: "Is there any chance you are pregnant?" "When was your last menstrual period?"     No  Protocols used: HIGH BLOOD PRESSURE-A-AH

## 2019-02-03 ENCOUNTER — Other Ambulatory Visit: Payer: Self-pay | Admitting: Internal Medicine

## 2019-02-03 ENCOUNTER — Other Ambulatory Visit (INDEPENDENT_AMBULATORY_CARE_PROVIDER_SITE_OTHER): Payer: Medicare Other

## 2019-02-03 ENCOUNTER — Other Ambulatory Visit: Payer: Self-pay

## 2019-02-03 DIAGNOSIS — M353 Polymyalgia rheumatica: Secondary | ICD-10-CM | POA: Diagnosis not present

## 2019-02-03 DIAGNOSIS — G4452 New daily persistent headache (NDPH): Secondary | ICD-10-CM | POA: Insufficient documentation

## 2019-02-03 DIAGNOSIS — R944 Abnormal results of kidney function studies: Secondary | ICD-10-CM

## 2019-02-03 DIAGNOSIS — I150 Renovascular hypertension: Secondary | ICD-10-CM

## 2019-02-03 LAB — CBC WITH DIFFERENTIAL/PLATELET
Basophils Absolute: 0 10*3/uL (ref 0.0–0.1)
Basophils Relative: 0.5 % (ref 0.0–3.0)
Eosinophils Absolute: 0.1 10*3/uL (ref 0.0–0.7)
Eosinophils Relative: 1.7 % (ref 0.0–5.0)
HCT: 38.9 % (ref 36.0–46.0)
Hemoglobin: 12.6 g/dL (ref 12.0–15.0)
Lymphocytes Relative: 21.7 % (ref 12.0–46.0)
Lymphs Abs: 1.5 10*3/uL (ref 0.7–4.0)
MCHC: 32.5 g/dL (ref 30.0–36.0)
MCV: 85.3 fl (ref 78.0–100.0)
Monocytes Absolute: 0.7 10*3/uL (ref 0.1–1.0)
Monocytes Relative: 9.5 % (ref 3.0–12.0)
Neutro Abs: 4.8 10*3/uL (ref 1.4–7.7)
Neutrophils Relative %: 66.6 % (ref 43.0–77.0)
Platelets: 178 10*3/uL (ref 150.0–400.0)
RBC: 4.56 Mil/uL (ref 3.87–5.11)
RDW: 17.9 % — ABNORMAL HIGH (ref 11.5–15.5)
WBC: 7.1 10*3/uL (ref 4.0–10.5)

## 2019-02-03 LAB — COMPREHENSIVE METABOLIC PANEL
ALT: 10 U/L (ref 0–35)
AST: 16 U/L (ref 0–37)
Albumin: 4.1 g/dL (ref 3.5–5.2)
Alkaline Phosphatase: 57 U/L (ref 39–117)
BUN: 16 mg/dL (ref 6–23)
CO2: 28 mEq/L (ref 19–32)
Calcium: 9.5 mg/dL (ref 8.4–10.5)
Chloride: 99 mEq/L (ref 96–112)
Creatinine, Ser: 1.21 mg/dL — ABNORMAL HIGH (ref 0.40–1.20)
GFR: 42.11 mL/min — ABNORMAL LOW (ref >60.00)
Glucose, Bld: 97 mg/dL (ref 70–99)
Potassium: 4.6 mEq/L (ref 3.5–5.1)
Sodium: 134 mEq/L — ABNORMAL LOW (ref 135–145)
Total Bilirubin: 0.6 mg/dL (ref 0.2–1.2)
Total Protein: 7.4 g/dL (ref 6.0–8.3)

## 2019-02-03 LAB — SEDIMENTATION RATE: Sed Rate: 25 mm/hr (ref 0–30)

## 2019-02-03 NOTE — Assessment & Plan Note (Signed)
Per patient, improving with tylenol.  ESR ordered to rule out arteritis

## 2019-02-03 NOTE — Progress Notes (Unsigned)
See labs result note sent via mychart.  Patient needs repeat bmet in one week

## 2019-02-03 NOTE — Assessment & Plan Note (Signed)
Diagnosed in the past by rheumatology.  ESRs have been repeatedly normal.  Given her new onset daily headache and bilateral hearing loss for the past 3 weeks ,  She has been advised to repeat labs ASAP  and will be sent to Dr Delana Meyer for temporal artery biopsy if ESR is elevated

## 2019-02-03 NOTE — Assessment & Plan Note (Signed)
Transiently resolved with stenting of 80% occluded right renal artery .  Resume amlodipine until current new issues can be sorted out

## 2019-02-04 ENCOUNTER — Telehealth: Payer: Self-pay

## 2019-02-04 MED ORDER — AMLODIPINE BESYLATE 10 MG PO TABS: 5.0000 mg | ORAL_TABLET | Freq: Two times a day (BID) | ORAL | 1 refills | Status: DC

## 2019-02-04 NOTE — Telephone Encounter (Signed)
Pharmacy called in to follow up on refill request.   Please assist.

## 2019-02-04 NOTE — Telephone Encounter (Signed)
This medication has already been refilled.

## 2019-02-04 NOTE — Progress Notes (Signed)
Spoke with pt and scheduled her for a lab appt. Pt stated that she drinks water all day long.

## 2019-02-05 DIAGNOSIS — H90A32 Mixed conductive and sensorineural hearing loss, unilateral, left ear with restricted hearing on the contralateral side: Secondary | ICD-10-CM | POA: Diagnosis not present

## 2019-02-05 DIAGNOSIS — H6122 Impacted cerumen, left ear: Secondary | ICD-10-CM | POA: Diagnosis not present

## 2019-02-05 DIAGNOSIS — H9120 Sudden idiopathic hearing loss, unspecified ear: Secondary | ICD-10-CM | POA: Diagnosis not present

## 2019-02-06 ENCOUNTER — Other Ambulatory Visit: Payer: Self-pay | Admitting: Internal Medicine

## 2019-02-06 DIAGNOSIS — I10 Essential (primary) hypertension: Secondary | ICD-10-CM

## 2019-02-12 ENCOUNTER — Other Ambulatory Visit: Payer: Self-pay

## 2019-02-12 ENCOUNTER — Other Ambulatory Visit (INDEPENDENT_AMBULATORY_CARE_PROVIDER_SITE_OTHER): Payer: Medicare Other

## 2019-02-12 DIAGNOSIS — R944 Abnormal results of kidney function studies: Secondary | ICD-10-CM | POA: Diagnosis not present

## 2019-02-12 LAB — BASIC METABOLIC PANEL
BUN: 21 mg/dL (ref 6–23)
CO2: 30 mEq/L (ref 19–32)
Calcium: 9.8 mg/dL (ref 8.4–10.5)
Chloride: 95 mEq/L — ABNORMAL LOW (ref 96–112)
Creatinine, Ser: 1.05 mg/dL (ref 0.40–1.20)
GFR: 49.59 mL/min — ABNORMAL LOW (ref >60.00)
Glucose, Bld: 101 mg/dL — ABNORMAL HIGH (ref 70–99)
Potassium: 4.5 mEq/L (ref 3.5–5.1)
Sodium: 130 mEq/L — ABNORMAL LOW (ref 135–145)

## 2019-02-15 ENCOUNTER — Other Ambulatory Visit: Payer: Self-pay | Admitting: Internal Medicine

## 2019-02-15 DIAGNOSIS — E871 Hypo-osmolality and hyponatremia: Secondary | ICD-10-CM

## 2019-02-24 ENCOUNTER — Ambulatory Visit: Payer: Self-pay | Admitting: Internal Medicine

## 2019-02-24 ENCOUNTER — Other Ambulatory Visit: Payer: Self-pay

## 2019-02-24 ENCOUNTER — Encounter: Payer: Self-pay | Admitting: Emergency Medicine

## 2019-02-24 ENCOUNTER — Emergency Department: Payer: Medicare Other

## 2019-02-24 ENCOUNTER — Emergency Department
Admission: EM | Admit: 2019-02-24 | Discharge: 2019-02-24 | Disposition: A | Discharge status: Home / Self Care | Payer: Medicare Other | Attending: Emergency Medicine | Admitting: Emergency Medicine

## 2019-02-24 DIAGNOSIS — R200 Anesthesia of skin: Secondary | ICD-10-CM | POA: Diagnosis not present

## 2019-02-24 DIAGNOSIS — Z7982 Long term (current) use of aspirin: Secondary | ICD-10-CM | POA: Insufficient documentation

## 2019-02-24 DIAGNOSIS — I10 Essential (primary) hypertension: Secondary | ICD-10-CM | POA: Insufficient documentation

## 2019-02-24 DIAGNOSIS — R202 Paresthesia of skin: Secondary | ICD-10-CM | POA: Insufficient documentation

## 2019-02-24 DIAGNOSIS — E039 Hypothyroidism, unspecified: Secondary | ICD-10-CM | POA: Diagnosis not present

## 2019-02-24 DIAGNOSIS — R519 Headache, unspecified: Secondary | ICD-10-CM

## 2019-02-24 DIAGNOSIS — Z7902 Long term (current) use of antithrombotics/antiplatelets: Secondary | ICD-10-CM | POA: Diagnosis not present

## 2019-02-24 DIAGNOSIS — Z87891 Personal history of nicotine dependence: Secondary | ICD-10-CM | POA: Insufficient documentation

## 2019-02-24 DIAGNOSIS — R51 Headache: Secondary | ICD-10-CM | POA: Diagnosis not present

## 2019-02-24 DIAGNOSIS — Z79899 Other long term (current) drug therapy: Secondary | ICD-10-CM | POA: Diagnosis not present

## 2019-02-24 DIAGNOSIS — R531 Weakness: Secondary | ICD-10-CM | POA: Diagnosis present

## 2019-02-24 LAB — URINALYSIS, COMPLETE (UACMP) WITH MICROSCOPIC
Bacteria, UA: NONE SEEN
Bilirubin Urine: NEGATIVE
Glucose, UA: NEGATIVE mg/dL
Hgb urine dipstick: NEGATIVE
Ketones, ur: NEGATIVE mg/dL
Leukocytes,Ua: NEGATIVE
Nitrite: NEGATIVE
Protein, ur: 30 mg/dL — AB
Specific Gravity, Urine: 1.014 (ref 1.005–1.030)
Squamous Epithelial / HPF: NONE SEEN (ref 0–5)
pH: 6 (ref 5.0–8.0)

## 2019-02-24 LAB — BASIC METABOLIC PANEL
Anion gap: 9 (ref 5–15)
BUN: 12 mg/dL (ref 8–23)
CO2: 26 mmol/L (ref 22–32)
Calcium: 9.7 mg/dL (ref 8.9–10.3)
Chloride: 100 mmol/L (ref 98–111)
Creatinine, Ser: 0.83 mg/dL (ref 0.44–1.00)
GFR calc Af Amer: >60 mL/min (ref >60)
GFR calc non Af Amer: >60 mL/min (ref >60)
Glucose, Bld: 105 mg/dL — ABNORMAL HIGH (ref 70–99)
Potassium: 4.5 mmol/L (ref 3.5–5.1)
Sodium: 135 mmol/L (ref 135–145)

## 2019-02-24 LAB — CBC
HCT: 41.8 % (ref 36.0–46.0)
Hemoglobin: 13.4 g/dL (ref 12.0–15.0)
MCH: 27.7 pg (ref 26.0–34.0)
MCHC: 32.1 g/dL (ref 30.0–36.0)
MCV: 86.4 fL (ref 80.0–100.0)
Platelets: 211 10*3/uL (ref 150–400)
RBC: 4.84 MIL/uL (ref 3.87–5.11)
RDW: 15.8 % — ABNORMAL HIGH (ref 11.5–15.5)
WBC: 7.9 10*3/uL (ref 4.0–10.5)
nRBC: 0 % (ref 0.0–0.2)

## 2019-02-24 LAB — TSH: TSH: 1.914 u[IU]/mL (ref 0.350–4.500)

## 2019-02-24 LAB — T4, FREE: Free T4: 1.27 ng/dL — ABNORMAL HIGH (ref 0.61–1.12)

## 2019-02-24 NOTE — ED Provider Notes (Signed)
[facility] Emergency Department Provider Note  ____________________________________________   First MD Initiated Contact with Patient 02/24/19 1247     (approximate)  I have reviewed the triage vital signs and the nursing notes.   HISTORY  Chief Complaint Weakness, Numbness, and Headache    HPI Kathryn Wise is a 85 y.o. female with hypertension, hypothyroidism, depression who presents with weakness, numbness and headaches.  Patient endorses having headaches that been going on for the past month.  Patient describes it as a dull sensation in the back of her head that is intermittent, does not take anything to help with the pain, gets better on its own.  When this first started she had MRI brain done that was otherwise reassuring except for a small chronic left cerebral infarct.  She presents today because she developed some changes in her sensation yesterday in her left arm and around her knee.  There is no associated weakness, slurred speech.  This went away on its own and then this morning she had some generalized weakness while having her headache so she presented to the ER.  She currently has a very mild headache.          Past Medical History:  Diagnosis Date  . Arthritis   . Dysphagia, pharyngoesophageal phase 09/10/2013   Upper GI study with barium swallow was done at  [facility] Mar 2015   No reflux seen  Small irreducible hiatal hernia  Mild changes of presbyeophagus (abnormal contractions of the esophagus that occur with aging) No strictures Normal gastric emptying  Incomplete visualization of stomach fold due to patient's inability turn    . GERD (gastroesophageal reflux disease)   . Heart murmur    has had years and years  . History of shingles Dec 2013   treated with steroids , post op from shoulder surgery  . Hypertension   . Hypothyroidism   . Major depressive disorder, single episode 11/05/2015  . Osteoarthritis of left shoulder 07/14/2012  .  Polymyalgia rheumatica [facility])     Patient Active Problem List   Diagnosis Date Noted  . New daily persistent headache 02/03/2019  . Hearing loss 01/23/2019  . Renal artery stenosis ([place]) 01/01/2019  . Lymphedema 01/01/2019  . PAD (peripheral artery disease) ([place]) 01/01/2019  . History of CVA (cerebrovascular accident) without residual deficits 11/13/2018  . Anemia, unspecified 11/13/2018  . Tinnitus aurium, bilateral 10/19/2018  . Blurred vision, bilateral 10/19/2018  . Insomnia due to psychological stress 10/07/2018  . Food intolerance in adult 10/07/2018  . Skin lesion of hand 10/07/2018  . Myalgia due to statin 12/10/2017  . Hyperlipidemia LDL goal <130 10/03/2017  . Hiatal hernia 07/03/2017  . Coronary artery disease 07/03/2017  . Hospital discharge follow-up 07/03/2017  . Chest pain 06/10/2017  . Pain in left wrist 09/20/2016  . Breast cancer screening by mammogram 09/20/2016  . Hypothyroidism 04/28/2015  . Hypertension 01/13/2014  . Osteopenia 10/25/2013  . GERD (gastroesophageal reflux disease) 09/11/2013  . Encounter for preventive health examination 09/11/2013  . Heart murmur 11/28/2012  . History of shingles   . Polymyalgia rheumatica ([place]) 11/18/2012  . Edema 11/08/2012  . Osteoarthritis of left shoulder 07/14/2012    Past Surgical History:  Procedure Laterality Date  . ARTHOSCOPIC ROTAOR CUFF REPAIR     LEFT  10+  YEARS    . CATARACT EXTRACTION W/ INTRAOCULAR LENS IMPLANT     RIGHT EYE  . CATARACT EXTRACTION W/PHACO Left 03/28/2016   Procedure: CATARACT  EXTRACTION PHACO AND INTRAOCULAR LENS PLACEMENT (IOC);  Surgeon: Leandrew Koyanagi, MD;  Location: [place];  Service: Ophthalmology;  Laterality: Left;  TORIC  . FACELIFT    . FOOT SURGERY     rt foot   TUMOR REMOVED  . JOINT REPLACEMENT Left Dec 2013   shoulder  . LEFT HEART CATH AND CORONARY ANGIOGRAPHY N/A 06/12/2017   Procedure: LEFT HEART CATH AND CORONARY ANGIOGRAPHY;  Surgeon: Minna Merritts, MD;  Location: [place] CV LAB;  Service: Cardiovascular;  Laterality: N/A;  . RENAL ANGIOGRAPHY Left 01/07/2019   Procedure: RENAL ANGIOGRAPHY;  Surgeon: Katha Cabal, MD;  Location: [place] CV LAB;  Service: Cardiovascular;  Laterality: Left;  . RENAL ARTERY STENT    . SKIN CANCER EXCISION    . TOTAL SHOULDER ARTHROPLASTY  07/14/2012   Procedure: TOTAL SHOULDER ARTHROPLASTY;  Surgeon: Johnny Bridge, MD;  Location: [place];  Service: Orthopedics;  Laterality: Left;    Prior to Admission medications   Medication Sig Start Date End Date Taking? Authorizing Provider  amLODipine (NORVASC) 10 MG tablet Take 0.5 tablets (5 mg total) by mouth 2 (two) times daily. 02/04/19   [name], MD  amLODipine (NORVASC) 5 MG tablet TAKE 2 TABLETS BY MOUTH ONCE DAILY 02/06/19   [name], MD  aspirin 81 MG tablet Take 81 mg by mouth daily.    [provider]  atorvastatin (LIPITOR) 10 MG tablet Take 1 tablet (10 mg total) by mouth daily. Further refills per her primary care 01/07/19 01/07/20  Schnier, Dolores Lory, MD  Cholecalciferol (VITAMIN D-3) 1000 units CAPS Take 1 capsule by mouth daily.    [provider]  clopidogrel (PLAVIX) 75 MG tablet Take 1 tablet (75 mg total) by mouth daily. 01/07/19   Schnier, Dolores Lory, MD  Cyanocobalamin (VITAMIN B 12) 100 MCG LOZG Take 1 tablet by mouth daily.     [provider]  ferrous sulfate 325 (65 FE) MG EC tablet Take 325 mg by mouth daily with breakfast.     [provider]  furosemide (LASIX) 20 MG tablet Take 1 tablet (20 mg total) by mouth daily. 12/01/18   [name], MD  levothyroxine (SYNTHROID) 88 MCG tablet TAKE 1 TABLET BY MOUTH ONCE DAILY ON AN EMPTY STOMACH. WAIT 30 MINUTES BEFORE TAKING OTHER MEDS. 01/29/19   [name], MD  Multiple Vitamins-Minerals (MULTIVITAMIN WITH MINERALS) tablet Take 1 tablet by mouth daily.    [provider]  nitroGLYCERIN (NITROSTAT) 0.4 MG SL  tablet Place 1 tablet (0.4 mg total) every 5 (five) minutes as needed under the tongue for chest pain. 06/12/17   Demetrios Loll, MD  Potassium 99 MG TABS Take 99 mg by mouth daily.     [provider]  RESTASIS 0.05 % ophthalmic emulsion Place 1 drop into both eyes 2 (two) times daily.  10/22/15   [provider]  temazepam (RESTORIL) 15 MG capsule TAKE 1 CAPSULE BY MOUTH AT BEDTIME AS NEEDED SLEEP Patient taking differently: Take 15 mg by mouth at bedtime as needed for sleep.  12/12/18   [name], MD    Allergies Alendronate, Metoprolol, and Oxycontin [oxycodone hcl]  Family History  Problem Relation Age of Onset  . Cancer Daughter        Breast  . Breast cancer Daughter 83  . Cancer Son     Social History Social History   Tobacco Use  . Smoking status: Former Smoker  Types: Cigarettes    Quit date: 07/30/1972    Years since quitting: 46.6  . Smokeless tobacco: Never Used  . Tobacco comment: smoked for only a few months  Substance Use Topics  . Alcohol use: Yes    Comment: OCCAS WINE   . Drug use: No      Review of Systems Constitutional: No fever/chills Eyes: No visual changes. ENT: No sore throat. Cardiovascular: Denies chest pain. Respiratory: Denies shortness of breath. Gastrointestinal: No abdominal pain.  No nausea, no vomiting.  No diarrhea.  No constipation. Genitourinary: Negative for dysuria. Musculoskeletal: Negative for back pain. Skin: Negative for rash. Neurological: Positive for headaches, tingling sensation All other ROS negative ____________________________________________   PHYSICAL EXAM:  VITAL SIGNS: ED Triage Vitals  Enc Vitals Group     BP 02/24/19 1128 (!) 168/72     Pulse Rate 02/24/19 1128 87     Resp 02/24/19 1128 16     Temp 02/24/19 1128 98.6 F (37 C)     Temp Source 02/24/19 1128 Oral     SpO2 02/24/19 1128 94 %     Weight 02/24/19 1129 150 lb (68 kg)     Height 02/24/19 1129 5\' 6"  (1.676 m)     Head  Circumference --      Peak Flow --      Pain Score 02/24/19 1129 4     Pain Loc --      Pain Edu? --      Excl. in [place]? --     Constitutional: Alert and oriented. Well appearing and in no acute distress. Eyes: Conjunctivae are normal. EOMI. Head: Atraumatic. Nose: No congestion/rhinnorhea. Mouth/Throat: Mucous membranes are moist.   Neck: No stridor. Trachea Midline. FROM Cardiovascular: Normal rate, regular rhythm. Grossly normal heart sounds.  Good peripheral circulation. Respiratory: Normal respiratory effort.  No retractions. Lungs CTAB. Gastrointestinal: Soft and nontender. No distention. No abdominal bruits.  Musculoskeletal: No lower extremity tenderness nor edema.  No joint effusions. Neurologic:  Normal speech and language.  Cranial nerves II through XII are intact.  Equal strength in arms and legs Skin:  Skin is warm, dry and intact. No rash noted. Psychiatric: Mood and affect are normal. Speech and behavior are normal. GU: Deferred   ____________________________________________   LABS (all labs ordered are listed, but only abnormal results are displayed)  Labs Reviewed  BASIC METABOLIC PANEL - Abnormal; Notable for the following components:      Result Value   Glucose, Bld 105 (*)    All other components within normal limits  CBC - Abnormal; Notable for the following components:   RDW 15.8 (*)    All other components within normal limits  URINALYSIS, COMPLETE (UACMP) WITH MICROSCOPIC - Abnormal; Notable for the following components:   Color, Urine YELLOW (*)    APPearance CLEAR (*)    Protein, ur 30 (*)    All other components within normal limits  T4, FREE - Abnormal; Notable for the following components:   Free T4 1.27 (*)    All other components within normal limits  TSH   ____________________________________________   ED ECG REPORT I, Vanessa Munden, the attending physician, personally viewed and interpreted this ECG.  EKG sinus rate of 85, left bundle  branch block, similar flipped T waves in lead I and aVL.  New flipped T wave in V5 V6 otherwise normal intervals. ____________________________________________  [place], personally viewed and evaluated these images (plain radiographs) as part of  my medical decision making, as well as reviewing the written report by the radiologist.   Official radiology report(s): Ct Head Wo Contrast  Result Date: 02/24/2019 CLINICAL DATA:  Pt has had headache for a few weeks thought to be related to her high blood pressure. Now she says she has had weakness since she got up this am. Says her left arm feels numb since yesterday. Also says headache, but says she has had the headache for a long time. She has no drift, no facial droop, pupils equal and reactive. No slurred speech. No hx of seizure, stroke or cancer. EXAM: CT HEAD WITHOUT CONTRAST TECHNIQUE: Contiguous axial images were obtained from the base of the skull through the vertex without intravenous contrast. COMPARISON:  Brain MRI and head CT, 11/08/2018 FINDINGS: Brain: No evidence of acute infarction, hemorrhage, hydrocephalus, extra-axial collection or mass lesion/mass effect. There is mild patchy white matter hypoattenuation consistent with chronic microvascular ischemic change. Vascular: No hyperdense vessel or unexpected calcification. Skull: Normal. Negative for fracture or focal lesion. Sinuses/Orbits: Visualized globes and orbits are unremarkable. The visualized sinuses and mastoid air cells are clear. Other: None. IMPRESSION: 1. No acute intracranial abnormalities. 2. Mild chronic microvascular ischemic change. Electronically Signed   By: Lajean Manes M.D.   On: 02/24/2019 13:53    ____________________________________________   PROCEDURES  Procedure(s) performed (including Critical Care):  Procedures   ____________________________________________   INITIAL IMPRESSION / ASSESSMENT AND PLAN / ED COURSE  Kathryn Wise was  evaluated in Emergency Department on 02/24/2019 for the symptoms described in the history of present illness. She was evaluated in the context of the global COVID-19 pandemic, which necessitated consideration that the patient might be at risk for infection with the SARS-CoV-2 virus that causes COVID-19. Institutional protocols and algorithms that pertain to the evaluation of patients at risk for COVID-19 are in a state of rapid change based on information released by regulatory bodies including the CDC and federal and state organizations. These policies and algorithms were followed during the patient's care in the ED.    Patient is a well-appearing 85 year old who has been dealing with headaches for the past 5 weeks.  Patient's description of some tingling sensation in her arm and in her knee seems less likely to be secondary to TIA considering it was just around her knee and not whole leg.  She currently has a normal stroke scale so I have low suspicion for stroke.  Will get CT head to evaluate tumor, intercranial hemorrhage.  Will get labs to evaluate for UTI, electrolyte abnormalities.  Discussed with patient getting neurology follow-up for her headaches, possible TIA workup.  Creatinine is normal.  Urine without signs of UTI.  2:38 PM reevaluated patient.  Patient is laughing with her friend at bedside.  Patient is feeling better.  Discussed with patient CT results.  Patient requested to have her thyroid studies checked.  I have low suspicion for severe hypothyroidism or hyperthyroidism therefore I will call patient if her results are abnormal she can follow this up with her primary care doctor.  Patient felt comfortable with this plan.  Attempted to call patient x2 without answer but thyroid labs only slightly elevated t4.    ____________________________________________   FINAL CLINICAL IMPRESSION(S) / ED DIAGNOSES   Final diagnoses:  Intractable headache, unspecified chronicity pattern,  unspecified headache type      MEDICATIONS GIVEN DURING THIS VISIT:  Medications - No data to display   ED Discharge Orders    None  Note:  This document was prepared using Dragon voice recognition software and may include unintentional dictation errors.   Vanessa Alamo, MD 02/24/19 [phone]

## 2019-02-24 NOTE — Telephone Encounter (Addendum)
Called and spoke to patient.  Pt was still at home.  Pt said that she was not feeling well, feeling weak had chest tightness but no chest pain, left arm numbness, and a little numbness in her fingers.  Pt said that she had a headache in the back of her head since last night.  Pt denied face droopiness.  Advised pt to go to ED to be evaluated.  Pt said that she had a friend with her in the home and agreed to have friend take her to ED.  Advised pt to leave soon and not to prolong.  Pt agreed.

## 2019-02-24 NOTE — ED Notes (Signed)
ED Provider at bedside. 

## 2019-02-24 NOTE — Discharge Instructions (Signed)
Your work-up here was reassuring.  Your thyroid test are pending.  You should call the neurologist to make a follow-up appointment.  You can also follow-up with your primary care doctor.  Return to the ER if you develop return of symptoms of numbness, weakness, difficulty speaking or any other concerns.

## 2019-02-24 NOTE — ED Notes (Signed)
Patient transported to CT 

## 2019-02-24 NOTE — ED Triage Notes (Signed)
Says weakness since she got up this am.  Says her left arm feels numb since yesterday. Also says headache, but says she has had the headache for a long time.  She has no drift, no facial droop, pupils equal and reactive.  No slurred speech.

## 2019-02-24 NOTE — Telephone Encounter (Signed)
Pt reports sudden headache, onset "During night." States "Back of head, near right ear."  Reports worse this AM. Also reports generalized weakness. "Feels like I'm gong to pass out." Denies dizziness, "Pass out from weakness."   Pt evasive historian.   When questioned regarding other symptoms states "My left arm is numb."  Pt directed to ED. Asked pt if she was alone, states she is not and she will have someone take her. Advised to go now and attempted to offer pt 911, pt states, "I'll take care of it" and hung up.  Spoke with Butch Penny to alert her of situation. Unsure if pt is in fact going to ED.  CB# [phone] [id number]  Reason for Disposition . [1] SEVERE headache AND [2] sudden-onset (i.e., reaching maximum intensity within seconds)  Answer Assessment - Initial Assessment Questions 1. LOCATION: "Where does it hurt?"      "Back of head near right ear" 2. ONSET: "When did the headache start?" (Minutes, hours or days)     "During night" 3. PATTERN: "Does the pain come and go, or has it been constant since it started?"    Constant and worse this AM 4. SEVERITY: "How bad is the pain?" and "What does it keep you from doing?"  (e.g., Scale 1-10; mild, moderate, or severe)   - MILD (1-3): doesn't interfere with normal activities    - MODERATE (4-7): interferes with normal activities or awakens from sleep    - SEVERE (8-10): excruciating pain, unable to do any normal activities        *No Answer* 5. RECURRENT SYMPTOM: "Have you ever had headaches before?" If so, ask: "When was the last time?" and "What happened that time?"      6. CAUSE: "What do you think is causing the headache?" unsure    7. MIGRAINE: "Have you been diagnosed with migraine headaches?" If so, ask: "Is this headache similar?"      8. HEAD INJURY: "Has there been any recent injury to the head?"      9. OTHER SYMPTOMS: "Do you have any other symptoms?" (fever, stiff neck, eye pain, sore throat, cold symptoms)    Generalized weakness,  "Left arm numb"  Protocols used: HEADACHE-A-AH

## 2019-02-26 DIAGNOSIS — H90A22 Sensorineural hearing loss, unilateral, left ear, with restricted hearing on the contralateral side: Secondary | ICD-10-CM | POA: Diagnosis not present

## 2019-02-26 DIAGNOSIS — H9122 Sudden idiopathic hearing loss, left ear: Secondary | ICD-10-CM | POA: Diagnosis not present

## 2019-03-03 ENCOUNTER — Telehealth: Payer: Self-pay | Admitting: Internal Medicine

## 2019-03-03 DIAGNOSIS — R200 Anesthesia of skin: Secondary | ICD-10-CM

## 2019-03-03 DIAGNOSIS — E034 Atrophy of thyroid (acquired): Secondary | ICD-10-CM

## 2019-03-03 DIAGNOSIS — R519 Headache, unspecified: Secondary | ICD-10-CM

## 2019-03-03 NOTE — Telephone Encounter (Signed)
Pt has been scheduled for an ED follow up tomorrow.

## 2019-03-03 NOTE — Telephone Encounter (Signed)
Her thyroid function was checked and normal.  She was ruled out for uti, anemia and renal failure.  All normal

## 2019-03-03 NOTE — Telephone Encounter (Signed)
She does not need to look at the t4 because the tsh is more stable and therefore more accurate.; however since she has been taking biotin,  she will need to stop the biotin and recheck the tsh after 3 days without biotin .  I will order the tsh .  Your headache and arm numbness may be coming from a cervical disk issue so I have ordered x rays for you to do at [facility]

## 2019-03-03 NOTE — Telephone Encounter (Signed)
Spoke with pt and informed her of the lab results from the hospital. The pt stated that she was concerned about her thyroid results because the T4 is elevated. The pt does take a multi vitamin that has Biotin in it. I advised pt that her thyroid function was normal. Pt is just very concerned because of the pains she is having in the back of her head. Pt is going to see [name] on 03/16/2019.

## 2019-03-03 NOTE — Telephone Encounter (Signed)
Copied from [place] [id number]. Topic: Quick Communication - See Telephone Encounter >> Mar 03, 2019  8:21 AM Nils Flack wrote: CRM for notification. See Telephone encounter for: 03/03/19. Pt is asking for call back regarding labs that were done when pt was in the ed.  Pt was told that she would get a call back from her pcp about them.  Please call [phone]

## 2019-03-04 ENCOUNTER — Ambulatory Visit (INDEPENDENT_AMBULATORY_CARE_PROVIDER_SITE_OTHER): Payer: Medicare Other | Admitting: Internal Medicine

## 2019-03-04 ENCOUNTER — Encounter: Payer: Self-pay | Admitting: Internal Medicine

## 2019-03-04 ENCOUNTER — Other Ambulatory Visit: Payer: Self-pay

## 2019-03-04 DIAGNOSIS — I701 Atherosclerosis of renal artery: Secondary | ICD-10-CM | POA: Diagnosis not present

## 2019-03-04 DIAGNOSIS — H6122 Impacted cerumen, left ear: Secondary | ICD-10-CM | POA: Diagnosis not present

## 2019-03-04 DIAGNOSIS — R51 Headache: Secondary | ICD-10-CM

## 2019-03-04 DIAGNOSIS — R519 Headache, unspecified: Secondary | ICD-10-CM

## 2019-03-04 MED ORDER — PREDNISONE 10 MG PO TABS: ORAL_TABLET | ORAL | 0 refills | Status: DC

## 2019-03-04 NOTE — Progress Notes (Signed)
Telephone  Note  This visit type was conducted due to national recommendations for restrictions regarding the COVID-19 pandemic (e.g. social distancing).  This format is felt to be most appropriate for this patient at this time.  All issues noted in this document were discussed and addressed.  No physical exam was performed (except for noted visual exam findings with Video Visits).   I connected with@ on 03/04/19 at 10:30 AM EDT by telephone and verified that I am speaking with the correct person using two identifiers. Location patient: home Location provider: work or home office Persons participating in the virtual visit: patient, provider  I discussed the limitations, risks, security and privacy concerns of performing an evaluation and management service by telephone and the availability of in person appointments. I also discussed with the patient that there may be a patient responsible charge related to this service. The patient expressed understanding and agreed to proceed.  Reason for visit: ER FOLLOW UP  HPI:  PATIENT feels fine today but states that last week she  had an episode of headache with severe pounding that was localized to the area  behind right ear.  She has had severe   occipital headaches as well   That were accompanied by subjective left arm weakness and numbness and severe hypertension . She notes that the Right side of head starts to thump and throb . Intermittent   .   She has had 3 head CTs In the last 5  months during ER visits for same, and a non contrasted MRI in April, with no acute changes or masses seen.  A small remote left cerebellar infarct was noted and small vessel ischemic changes were noted to be mild.   No prior evaluation of cervical spine   Background history:  Loss of hearing in left ear overnight a few weeks ago the night prior to having her renal stent . Treated by [facility] for impacted cerumen , irrigation unsuccessful in clearing,   Given cipro HC , referred to ENT and treated on July 30 with a reportedly painful removal of impaction and given topical prednisone (records unavailable )  .  Hearing test  done for left ear hearing loss which showed some regain of hearing with clearing of impaction    ROS: See pertinent positives and negatives per HPI.  Past Medical History:  Diagnosis Date  . Arthritis   . Dysphagia, pharyngoesophageal phase 09/10/2013   Upper GI study with barium swallow was done at  [facility] [place] Mar 2015   No reflux seen  Small irreducible hiatal hernia  Mild changes of presbyeophagus (abnormal contractions of the esophagus that occur with aging) No strictures Normal gastric emptying  Incomplete visualization of stomach fold due to patient's inability turn    . GERD (gastroesophageal reflux disease)   . Heart murmur    has had years and years  . History of shingles Dec 2013   treated with steroids , post op from shoulder surgery  . Hypertension   . Hypothyroidism   . Major depressive disorder, single episode 11/05/2015  . Osteoarthritis of left shoulder 07/14/2012  . Polymyalgia rheumatica (HCC)     Past Surgical History:  Procedure Laterality Date  . ARTHOSCOPIC ROTAOR CUFF REPAIR     LEFT  10+  YEARS    . CATARACT EXTRACTION W/ INTRAOCULAR LENS IMPLANT     RIGHT EYE  . CATARACT EXTRACTION W/PHACO Left 03/28/2016   Procedure: CATARACT EXTRACTION PHACO AND INTRAOCULAR LENS PLACEMENT (  [place]);  Surgeon: Leandrew Koyanagi, MD;  Location: [facility];  Service: Ophthalmology;  Laterality: Left;  TORIC  . FACELIFT    . FOOT SURGERY     rt foot   TUMOR REMOVED  . JOINT REPLACEMENT Left Dec 2013   shoulder  . LEFT HEART CATH AND CORONARY ANGIOGRAPHY N/A 06/12/2017   Procedure: LEFT HEART CATH AND CORONARY ANGIOGRAPHY;  Surgeon: Minna Merritts, MD;  Location: [facility];  Service: Cardiovascular;  Laterality: N/A;  . RENAL ANGIOGRAPHY Left 01/07/2019   Procedure: RENAL ANGIOGRAPHY;  Surgeon:  Katha Cabal, MD;  Location: [facility];  Service: Cardiovascular;  Laterality: Left;  . RENAL ARTERY STENT    . SKIN CANCER EXCISION    . TOTAL SHOULDER ARTHROPLASTY  07/14/2012   Procedure: TOTAL SHOULDER ARTHROPLASTY;  Surgeon: Johnny Bridge, MD;  Location: [facility];  Service: Orthopedics;  Laterality: Left;    Family History  Problem Relation Age of Onset  . Cancer Daughter        Breast  . Breast cancer Daughter 51  . Cancer Son     SOCIAL HX:  reports that she quit smoking about 46 years ago. Her smoking use included cigarettes. She has never used smokeless tobacco. She reports current alcohol use. She reports that she does not use drugs.   Current Outpatient Medications:  .  amLODipine (NORVASC) 5 MG tablet, TAKE 2 TABLETS BY MOUTH ONCE DAILY, Disp: 180 tablet, Rfl: 1 .  aspirin 81 MG tablet, Take 81 mg by mouth daily., Disp: , Rfl:  .  atorvastatin (LIPITOR) 10 MG tablet, Take 1 tablet (10 mg total) by mouth daily. Further refills per her primary care, Disp: 30 tablet, Rfl: 0 .  Cholecalciferol (VITAMIN D-3) 1000 units CAPS, Take 1 capsule by mouth daily., Disp: , Rfl:  .  clopidogrel (PLAVIX) 75 MG tablet, Take 1 tablet (75 mg total) by mouth daily., Disp: 30 tablet, Rfl: 4 .  Cyanocobalamin (VITAMIN B 12) 100 MCG LOZG, Take 1 tablet by mouth daily. , Disp: , Rfl:  .  furosemide (LASIX) 20 MG tablet, Take 1 tablet (20 mg total) by mouth daily., Disp: 90 tablet, Rfl: 1 .  levothyroxine (SYNTHROID) 88 MCG tablet, TAKE 1 TABLET BY MOUTH ONCE DAILY ON AN EMPTY STOMACH. WAIT 30 MINUTES BEFORE TAKING OTHER MEDS., Disp: 90 tablet, Rfl: 0 .  nitroGLYCERIN (NITROSTAT) 0.4 MG SL tablet, Place 1 tablet (0.4 mg total) every 5 (five) minutes as needed under the tongue for chest pain., Disp: 30 tablet, Rfl: 2 .  Potassium 99 MG TABS, Take 99 mg by mouth daily. , Disp: , Rfl:  .  RESTASIS 0.05 % ophthalmic emulsion, Place 1 drop into both eyes 2 (two) times daily. , Disp: , Rfl:   .  temazepam (RESTORIL) 15 MG capsule, TAKE 1 CAPSULE BY MOUTH AT BEDTIME AS NEEDED SLEEP (Patient taking differently: Take 15 mg by mouth at bedtime as needed for sleep. ), Disp: 30 capsule, Rfl: 5 .  Multiple Vitamins-Minerals (MULTIVITAMIN WITH MINERALS) tablet, Take 1 tablet by mouth daily., Disp: , Rfl:  .  predniSONE (DELTASONE) 10 MG tablet, 6 tablets on Day 1 , then reduce by 1 tablet daily until gone, Disp: 21 tablet, Rfl: 0  EXAM:   General impression: alert, cooperative and articulate.  No signs of being in distress  Lungs: speech is fluent sentence length suggests that patient is not short of breath and not punctuated by cough, sneezing or  sniffing. [name]   Psych: affect normal.  speech is articulate and non pressured .  Denies suicidal thoughts    ASSESSMENT AND PLAN:  Recurrent occipital headache She has been effectively ruled out for masses.  ESR has been normal.  Plain cervical spine films suggest degenerative changes which may be contributing.  Recommending MRI , she already has neurology evaluation scheduled.  Lab Results  Component Value Date   ESRSEDRATE 25 02/03/2019     Hearing loss due to cerumen impaction, left AWAITING notes from ENT     I discussed the assessment and treatment plan with the patient. The patient was provided an opportunity to ask questions and all were answered. The patient agreed with the plan and demonstrated an understanding of the instructions.   The patient was advised to call back or seek an in-person evaluation if the symptoms worsen or if the condition fails to improve as anticipated.  I provided 22 minutes of non-face-to-face time during this encounter.   [name], MD

## 2019-03-04 NOTE — Telephone Encounter (Signed)
Spoke with Kathryn Wise and informed her of [name] message below. Kathryn Wise stated that she does not want to do the xray, she stated that she would rather wait until she sees [name].

## 2019-03-05 ENCOUNTER — Ambulatory Visit
Admission: RE | Admit: 2019-03-05 | Discharge: 2019-03-05 | Disposition: A | Discharge status: Home / Self Care | Payer: Medicare Other | Source: Ambulatory Visit | Attending: Internal Medicine | Admitting: Internal Medicine

## 2019-03-05 DIAGNOSIS — R51 Headache: Secondary | ICD-10-CM | POA: Insufficient documentation

## 2019-03-05 DIAGNOSIS — R200 Anesthesia of skin: Secondary | ICD-10-CM | POA: Diagnosis not present

## 2019-03-05 DIAGNOSIS — R519 Headache, unspecified: Secondary | ICD-10-CM

## 2019-03-05 DIAGNOSIS — M47812 Spondylosis without myelopathy or radiculopathy, cervical region: Secondary | ICD-10-CM | POA: Diagnosis not present

## 2019-03-05 DIAGNOSIS — M4802 Spinal stenosis, cervical region: Secondary | ICD-10-CM | POA: Diagnosis not present

## 2019-03-07 DIAGNOSIS — R519 Headache, unspecified: Secondary | ICD-10-CM | POA: Insufficient documentation

## 2019-03-07 NOTE — Assessment & Plan Note (Signed)
AWAITING notes from ENT

## 2019-03-07 NOTE — Assessment & Plan Note (Signed)
She has been effectively ruled out for masses.  ESR has been normal.  Plain cervical spine films suggest degenerative changes which may be contributing.  Recommending MRI , she already has neurology evaluation scheduled.  Lab Results  Component Value Date   ESRSEDRATE 25 02/03/2019

## 2019-03-16 DIAGNOSIS — H93A3 Pulsatile tinnitus, bilateral: Secondary | ICD-10-CM | POA: Diagnosis not present

## 2019-03-16 DIAGNOSIS — I672 Cerebral atherosclerosis: Secondary | ICD-10-CM | POA: Diagnosis not present

## 2019-03-16 DIAGNOSIS — R51 Headache: Secondary | ICD-10-CM | POA: Diagnosis not present

## 2019-03-17 ENCOUNTER — Other Ambulatory Visit: Payer: Self-pay | Admitting: Neurology

## 2019-03-17 DIAGNOSIS — I672 Cerebral atherosclerosis: Secondary | ICD-10-CM

## 2019-03-17 DIAGNOSIS — H93A3 Pulsatile tinnitus, bilateral: Secondary | ICD-10-CM

## 2019-03-18 ENCOUNTER — Telehealth: Payer: Self-pay | Admitting: Internal Medicine

## 2019-03-18 NOTE — Telephone Encounter (Signed)
    To whom it may concern:  The above mentioned patient has physical conditions  that are treated in part with regular use of steam room and sauna to improve her ellbeing and am recommending that he/she be allowed to return to regular use of these amenities at your facility as long as the appropriate social distancing and other measures needed to minimize transmission or infection with the COVID 19 virus are followed.     Sincerely,    [name], MD

## 2019-03-18 NOTE — Telephone Encounter (Signed)
Letter sent to my chart

## 2019-03-28 ENCOUNTER — Ambulatory Visit
Admission: RE | Admit: 2019-03-28 | Discharge: 2019-03-28 | Disposition: A | Discharge status: Home / Self Care | Payer: Medicare Other | Source: Ambulatory Visit | Attending: Neurology | Admitting: Neurology

## 2019-03-28 ENCOUNTER — Other Ambulatory Visit: Payer: Self-pay

## 2019-03-28 DIAGNOSIS — R51 Headache: Secondary | ICD-10-CM | POA: Diagnosis not present

## 2019-03-28 DIAGNOSIS — H93A3 Pulsatile tinnitus, bilateral: Secondary | ICD-10-CM | POA: Diagnosis not present

## 2019-04-16 ENCOUNTER — Other Ambulatory Visit: Payer: Self-pay

## 2019-04-20 ENCOUNTER — Other Ambulatory Visit: Payer: Self-pay

## 2019-04-20 ENCOUNTER — Encounter: Payer: Self-pay | Admitting: Internal Medicine

## 2019-04-20 ENCOUNTER — Ambulatory Visit (INDEPENDENT_AMBULATORY_CARE_PROVIDER_SITE_OTHER): Payer: Medicare Other | Admitting: Internal Medicine

## 2019-04-20 ENCOUNTER — Ambulatory Visit: Payer: Medicare Other | Admitting: Internal Medicine

## 2019-04-20 ENCOUNTER — Ambulatory Visit (INDEPENDENT_AMBULATORY_CARE_PROVIDER_SITE_OTHER): Payer: Medicare Other

## 2019-04-20 VITALS — BP 148/76 | HR 85 | Temp 98.1°F | Wt 150.6 lb

## 2019-04-20 DIAGNOSIS — I150 Renovascular hypertension: Secondary | ICD-10-CM

## 2019-04-20 DIAGNOSIS — H9122 Sudden idiopathic hearing loss, left ear: Secondary | ICD-10-CM | POA: Diagnosis not present

## 2019-04-20 DIAGNOSIS — Z0000 Encounter for general adult medical examination without abnormal findings: Secondary | ICD-10-CM | POA: Diagnosis not present

## 2019-04-20 DIAGNOSIS — H9313 Tinnitus, bilateral: Secondary | ICD-10-CM

## 2019-04-20 DIAGNOSIS — I701 Atherosclerosis of renal artery: Secondary | ICD-10-CM | POA: Diagnosis not present

## 2019-04-20 DIAGNOSIS — Z23 Encounter for immunization: Secondary | ICD-10-CM | POA: Diagnosis not present

## 2019-04-20 DIAGNOSIS — F5102 Adjustment insomnia: Secondary | ICD-10-CM

## 2019-04-20 DIAGNOSIS — R51 Headache: Secondary | ICD-10-CM

## 2019-04-20 DIAGNOSIS — R519 Headache, unspecified: Secondary | ICD-10-CM

## 2019-04-20 MED ORDER — LEVOTHYROXINE SODIUM 88 MCG PO TABS: ORAL_TABLET | ORAL | 1 refills | Status: DC

## 2019-04-20 NOTE — Progress Notes (Signed)
Subjective:  Patient ID: Kathryn Wise, female    DOB: 1930/05/14  Age: 85 y.o. MRN: [id number]  CC: The primary encounter diagnosis was Sudden idiopathic hearing loss of left ear with unrestricted hearing of right ear. Diagnoses of Need for immunization against influenza, Tinnitus aurium, bilateral, Recurrent occipital headache, Renovascular hypertension, and Insomnia due to psychological stress were also pertinent to this visit.  HPI [name] presents for follow up on hearing loss, headache and Hypertension   Still bothered by  loss of hearing  In the left ear that occurred suddenly the night before she received a renal artery stent.  The loss has been persistent despite cerumen removal and treatment of otitis external infection with cipro/hc in June.   She has been seen by local ENT twice for follow up and  hearing test which reportedly noted regain of some of the hearing in her left ear. .  She is very frustrated at her loss of hearing and wants a second opinion  at [facility]    Neurology :  Referred to Dr Manuella Ghazi for evaluation of recurrent occipital headache with tinnitus. .  Occipital neuralgia and tinnitus diagnosed.  MRI MRA noncontributory.  He  gave her seroquel for occipital neuralgia and and one dose resolved her tinnitus and pain,  But caused a lower extremity rash tha t has  resolved,  A a new neuralgia  Of the left shin that has not resolved.   Using restoril at night , sleeping better.  Does not want occipit nerve blocks offered by   Manuella Ghazi.  She just wants her loss of hearing addressed.  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  If she blocks the right ear SOMETIMES she can hear better out of the left    HTN: most recent home BP reading 134/68   Outpatient Medications Prior to Visit  Medication Sig Dispense Refill   amLODipine (NORVASC) 5 MG tablet TAKE 2 TABLETS BY MOUTH ONCE DAILY 180 tablet 1   Cholecalciferol (VITAMIN D-3) 1000 units CAPS Take 1  capsule by mouth daily.     clopidogrel (PLAVIX) 75 MG tablet Take 1 tablet (75 mg total) by mouth daily. 30 tablet 4   Cyanocobalamin (VITAMIN B 12) 100 MCG LOZG Take 1 tablet by mouth daily.      furosemide (LASIX) 20 MG tablet Take 1 tablet (20 mg total) by mouth daily. 90 tablet 1   nitroGLYCERIN (NITROSTAT) 0.4 MG SL tablet Place 1 tablet (0.4 mg total) every 5 (five) minutes as needed under the tongue for chest pain. 30 tablet 2   Potassium 99 MG TABS Take 99 mg by mouth daily.      RESTASIS 0.05 % ophthalmic emulsion Place 1 drop into both eyes 2 (two) times daily.      temazepam (RESTORIL) 15 MG capsule TAKE 1 CAPSULE BY MOUTH AT BEDTIME AS NEEDED SLEEP (Patient taking differently: Take 15 mg by mouth at bedtime as needed for sleep. ) 30 capsule 5   levothyroxine (SYNTHROID) 88 MCG tablet TAKE 1 TABLET BY MOUTH ONCE DAILY ON AN EMPTY STOMACH. WAIT 30 MINUTES BEFORE TAKING OTHER MEDS. 90 tablet 0   aspirin 81 MG tablet Take 81 mg by mouth daily.     atorvastatin (LIPITOR) 10 MG tablet Take 1 tablet (10 mg total) by mouth daily. Further refills per her primary care (Patient not taking: Reported on 04/20/2019) 30 tablet 0   Multiple Vitamins-Minerals (MULTIVITAMIN WITH MINERALS) tablet  Take 1 tablet by mouth daily.     predniSONE (DELTASONE) 10 MG tablet 6 tablets on Day 1 , then reduce by 1 tablet daily until gone (Patient not taking: Reported on 04/20/2019) 21 tablet 0   No facility-administered medications prior to visit.     Review of Systems;  Patient denies headache, fevers, malaise, unintentional weight loss, skin rash, eye pain, sinus congestion and sinus pain, sore throat, dysphagia,  hemoptysis , cough, dyspnea, wheezing, chest pain, palpitations, orthopnea, edema, abdominal pain, nausea, melena, diarrhea, constipation, flank pain, dysuria, hematuria, urinary  Frequency, nocturia, numbness, tingling, seizures,  Focal weakness, Loss of consciousness,  Tremor, insomnia,  depression, anxiety, and suicidal ideation.      Objective:  BP (!) 148/76    Pulse 85    Temp 98.1 F (36.7 C)    Wt 150 lb 9.6 oz (68.3 kg)    SpO2 96%    BMI 24.31 kg/m   BP Readings from Last 3 Encounters:  04/20/19 (!) 148/76  03/04/19 (!) 149/83  02/24/19 (!) 163/83    Wt Readings from Last 3 Encounters:  04/20/19 150 lb 9.6 oz (68.3 kg)  03/28/19 152 lb (68.9 kg)  03/04/19 150 lb (68 kg)    General appearance: alert, cooperative and appears stated age Ears: normal TM's and external ear canals both ears Throat: lips, mucosa, and tongue normal; teeth and gums normal Neck: no adenopathy, no carotid bruit, supple, symmetrical, trachea midline and thyroid not enlarged, symmetric, no tenderness/mass/nodules Back: symmetric, no curvature. ROM normal. No CVA tenderness. Lungs: clear to auscultation bilaterally Heart: regular rate and rhythm, S1, S2 normal, no murmur, click, rub or gallop Abdomen: soft, non-tender; bowel sounds normal; no masses,  no organomegaly Pulses: 2+ and symmetric Skin: Skin color, texture, turgor normal. No rashes or lesions Lymph nodes: Cervical, supraclavicular, and axillary nodes normal.  Lab Results  Component Value Date   HGBA1C 6.3 10/01/2017   HGBA1C 6.1 12/08/2015    Lab Results  Component Value Date   CREATININE 0.83 02/24/2019   CREATININE 1.05 02/12/2019   CREATININE 1.21 (H) 02/03/2019    Lab Results  Component Value Date   WBC 7.9 02/24/2019   HGB 13.4 02/24/2019   HCT 41.8 02/24/2019   PLT 211 02/24/2019   GLUCOSE 105 (H) 02/24/2019   CHOL 181 10/01/2017   TRIG 113.0 10/01/2017   HDL 43.80 10/01/2017   LDLDIRECT 126.0 09/19/2016   LDLCALC 115 (H) 10/01/2017   ALT 10 02/03/2019   AST 16 02/03/2019   NA 135 02/24/2019   K 4.5 02/24/2019   CL 100 02/24/2019   CREATININE 0.83 02/24/2019   BUN 12 02/24/2019   CO2 26 02/24/2019   TSH 1.914 02/24/2019   INR 1.0 11/08/2018   HGBA1C 6.3 10/01/2017    Mr Angio Head Wo  Contrast  Result Date: 03/28/2019 CLINICAL DATA:  85 year old female with episodes of severe headache, pain behind the right ear. Associated left arm weakness and numbness. EXAM: MRA HEAD WITHOUT CONTRAST TECHNIQUE: Angiographic images of the Circle of Willis were obtained using MRA technique without intravenous contrast. COMPARISON:  Brain MRI 11/08/2018. FINDINGS: Cerebral volume appears stable with no intracranial mass effect or ventriculomegaly. Antegrade flow in the posterior circulation with codominant distal vertebral arteries. Patent PICA origins and no distal vertebral stenosis. Patent vertebrobasilar junction. Patent basilar artery without stenosis. Normal SCA and PCA origins. Small left posterior communicating artery, the right is more diminutive or absent. Bilateral PCA branches are within normal limits. Antegrade  flow in both ICA siphons. The right siphon is slightly larger and may relate to a non dominant appearance of the left ACA A1 segment. Mild siphon irregularity. No siphon stenosis. Normal right ophthalmic and left posterior communicating artery origins. Patent carotid termini. Patent MCA and ACA origins. Non dominant appearing left A1. Anterior communicating artery and visible ACA branches are within normal limits. MCA M1 segments, bifurcations, and visible bilateral MCA branches are within normal limits. No abnormal flow signal identified about the right temporal bone or elsewhere. IMPRESSION: Normal for age intracranial MRA. Electronically Signed   By: Genevie Ann M.D.   On: 03/28/2019 21:23   Mr Mrv Head Wo Cm  Result Date: 03/28/2019 CLINICAL DATA:  85 year old female with episodes of severe headache, pain behind the right ear. Associated left arm weakness and numbness. EXAM: MR VENOGRAM OF THE  HEAD WITHOUT CONTRAST TECHNIQUE: Angiographic images of the intracranial venous structures were obtained using MRV technique without intravenous contrast. COMPARISON:  Intracranial MRA today  reported separately. Brain MRI 11/08/2018. FINDINGS: Coronal time-of-flight images reveal preserved flow signal in the superior sagittal sinus, torcula, straight sinus, vein of Galen, internal cerebral veins and basal veins of Rosenthal. Preserved flow signal in both transverse sinuses, both sigmoid sinuses, and the left IJ bulb. There appears to be a large emissary vein communicating from the sigmoid sinus into the right suboccipital/paravertebral region (series 10, image 74) which demonstrates preserved flow signal. This is also evident on the MRI earlier this year. There is a smaller left side emissary vein. There is bilateral paravertebral venous flow signal. IMPRESSION: 1. Negative for intracranial venous sinus thrombosis. 2. Prominent right side occipital emissary vein from the sigmoid sinus to paravertebral veins resulting in preferential venous flow on that side into the paravertebral venous system. There is a smaller left side emissary vein. These findings are unchanged from April this year, and typically are Normal Anatomic Variations. However, in this clinical setting consider a follow-up Doppler Ultrasound Of The Neck (or alternatively routine CT Neck with IV contrast) to exclude the possibility of underlying Right Internal Jugular Vein Thrombosis. These results will be called to the ordering clinician or representative by the Radiologist Assistant, and communication documented in the PACS or zVision Dashboard. Electronically Signed   By: Genevie Ann M.D.   On: 03/28/2019 21:31    Assessment & Plan:   Problem List Items Addressed This Visit      Unprioritized   Hypertension    Reasonable controlled on current regimen for her age  Renal function stable, no changes today.      Insomnia due to psychological stress    Well managed with 15 mg Restoril       Tinnitus aurium, bilateral    Per patient, resolved with one dose of seroquel. Normal MRI/MRA, but her hearing loss in the left ear has not  improved.  Refer to [facility] per patient request for second opinion       Recurrent occipital headache    She has been effectively ruled out for masses repeatedly. Her  ESR has been normal.  Plain cervical spine films suggest degenerative changes which may be contributing.  Recommending MRI , but she has deferred .  Lab Results  Component Value Date   ESRSEDRATE 25 02/03/2019          Other Visit Diagnoses    Sudden idiopathic hearing loss of left ear with unrestricted hearing of right ear    -  Primary   Relevant  Orders   Ambulatory referral to ENT   Need for immunization against influenza       Relevant Orders   Flu Vaccine QUAD High Dose(Fluad) (Completed)    A total of 25 minutes of face to face time was spent with patient more than half of which was spent in counselling on the above mentioned conditions and coordination of care   I have discontinued Alfredia B. Tagle's aspirin, multivitamin with minerals, atorvastatin, and predniSONE. I have also changed her levothyroxine. Additionally, I am having her maintain her Restasis, Vitamin B 12, Vitamin D-3, Potassium, nitroGLYCERIN, furosemide, temazepam, clopidogrel, and amLODipine.  Meds ordered this encounter  Medications   levothyroxine (SYNTHROID) 88 MCG tablet    Sig: One tablet daily in the morning    Dispense:  90 tablet    Refill:  1    Medications Discontinued During This Encounter  Medication Reason   aspirin 81 MG tablet Patient Preference   atorvastatin (LIPITOR) 10 MG tablet Patient has not taken in last 30 days   Multiple Vitamins-Minerals (MULTIVITAMIN WITH MINERALS) tablet Patient Preference   predniSONE (DELTASONE) 10 MG tablet Completed Course   levothyroxine (SYNTHROID) 88 MCG tablet Reorder    Follow-up: Return in about 6 months (around 10/18/2019).   [name], MD

## 2019-04-20 NOTE — Assessment & Plan Note (Addendum)
She has been effectively ruled out for masses repeatedly. Her  ESR has been normal.  Plain cervical spine films suggest degenerative changes which may be contributing.  Recommending MRI , but she has deferred .  Lab Results  Component Value Date   ESRSEDRATE 25 02/03/2019

## 2019-04-20 NOTE — Patient Instructions (Addendum)
  Kathryn Wise , Thank you for taking time to come for your Medicare Wellness Visit. I appreciate your ongoing commitment to your health goals. Please review the following plan we discussed and let me know if I can assist you in the future.   These are the goals we discussed: Goals    . Maintain Healthy Lifestyle     Healthy diet Stay hydrated Exercise Keep scheduled visits with pcp        This is a list of the screening recommended for you and due dates:  Health Maintenance  Topic Date Due  . Flu Shot  10/28/2019*  . Tetanus Vaccine  10/08/2023  . DEXA scan (bone density measurement)  Completed  . Pneumonia vaccines  Completed  *Topic was postponed. The date shown is not the original due date.

## 2019-04-20 NOTE — Assessment & Plan Note (Signed)
Well managed with 15 mg Restoril

## 2019-04-20 NOTE — Progress Notes (Addendum)
Subjective:   Kathryn Wise is a 85 y.o. female who presents for Medicare Annual (Subsequent) preventive examination.  Review of Systems:  No ROS.  Medicare Wellness Virtual Visit.  Visual/audio telehealth visit, UTA vital signs.   See social history for additional risk factors.   Cardiac Risk Factors include: advanced age (>66men, >6 women);hypertension     Objective:     Vitals: There were no vitals taken for this visit.  There is no height or weight on file to calculate BMI.  Advanced Directives 04/20/2019 02/24/2019 01/07/2019 11/08/2018 01/15/2018 06/12/2017 06/10/2017  Does Patient Have a Medical Advance Directive? No Yes No No Yes Yes Yes  Type of Advance Directive - - - - Press photographer;Living will [place];Living will Living will;Healthcare Power of Attorney  Does patient want to make changes to medical advance directive? - - - - No - Patient declined No - Patient declined No - Patient declined  Copy of [place] in Chart? - - - - No - copy requested No - copy requested No - copy requested  Would patient like information on creating a medical advance directive? No - Patient declined - No - Patient declined No - Patient declined - - -  Pre-existing out of facility DNR order (yellow form or pink MOST form) - - - - - - -    Tobacco Social History   Tobacco Use  Smoking Status Former Smoker  . Types: Cigarettes  . Quit date: 07/30/1972  . Years since quitting: 46.7  Smokeless Tobacco Never Used  Tobacco Comment   smoked for only a few months     Counseling given: Not Answered Comment: smoked for only a few months   Clinical Intake:  Pre-visit preparation completed: Yes        Diabetes: No  How often do you need to have someone help you when you read instructions, pamphlets, or other written materials from your doctor or pharmacy?: 1 - Never  Interpreter Needed?: No     Past Medical History:  Diagnosis  Date  . Arthritis   . Dysphagia, pharyngoesophageal phase 09/10/2013   Upper GI study with barium swallow was done at  [facility] Mar 2015   No reflux seen  Small irreducible hiatal hernia  Mild changes of presbyeophagus (abnormal contractions of the esophagus that occur with aging) No strictures Normal gastric emptying  Incomplete visualization of stomach fold due to patient's inability turn    . GERD (gastroesophageal reflux disease)   . Heart murmur    has had years and years  . History of shingles Dec 2013   treated with steroids , post op from shoulder surgery  . Hypertension   . Hypothyroidism   . Major depressive disorder, single episode 11/05/2015  . Osteoarthritis of left shoulder 07/14/2012  . Polymyalgia rheumatica (HCC)    Past Surgical History:  Procedure Laterality Date  . ARTHOSCOPIC ROTAOR CUFF REPAIR     LEFT  10+  YEARS    . CATARACT EXTRACTION W/ INTRAOCULAR LENS IMPLANT     RIGHT EYE  . CATARACT EXTRACTION W/PHACO Left 03/28/2016   Procedure: CATARACT EXTRACTION PHACO AND INTRAOCULAR LENS PLACEMENT (IOC);  Surgeon: Leandrew Koyanagi, MD;  Location: [place];  Service: Ophthalmology;  Laterality: Left;  TORIC  . FACELIFT    . FOOT SURGERY     rt foot   TUMOR REMOVED  . JOINT REPLACEMENT Left Dec 2013   shoulder  . LEFT  HEART CATH AND CORONARY ANGIOGRAPHY N/A 06/12/2017   Procedure: LEFT HEART CATH AND CORONARY ANGIOGRAPHY;  Surgeon: Minna Merritts, MD;  Location: [place] [facility];  Service: Cardiovascular;  Laterality: N/A;  . RENAL ANGIOGRAPHY Left 01/07/2019   Procedure: RENAL ANGIOGRAPHY;  Surgeon: Katha Cabal, MD;  Location: [place] [facility];  Service: Cardiovascular;  Laterality: Left;  . RENAL ARTERY STENT    . SKIN CANCER EXCISION    . TOTAL SHOULDER ARTHROPLASTY  07/14/2012   Procedure: TOTAL SHOULDER ARTHROPLASTY;  Surgeon: Johnny Bridge, MD;  Location: [place];  Service: Orthopedics;  Laterality: Left;   Family History  Problem  Relation Age of Onset  . Cancer Daughter        Breast  . Breast cancer Daughter 20  . Cancer Son    Social History   Socioeconomic History  . Marital status: Widowed    Spouse name: Not on file  . Number of children: Not on file  . Years of education: Not on file  . Highest education level: Not on file  Occupational History  . Not on file  Social Needs  . Financial resource strain: Not hard at all  . Food insecurity    Worry: Never true    Inability: Never true  . Transportation needs    Medical: No    Non-medical: No  Tobacco Use  . Smoking status: Former Smoker    Types: Cigarettes    Quit date: 07/30/1972    Years since quitting: 46.7  . Smokeless tobacco: Never Used  . Tobacco comment: smoked for only a few months  Substance and Sexual Activity  . Alcohol use: Yes    Comment: OCCAS WINE   . Drug use: No  . Sexual activity: Never  Lifestyle  . Physical activity    Days per week: 4 days    Minutes per session: Not on file  . Stress: Not at all  Relationships  . Social Herbalist on phone: Not on file    Gets together: Not on file    Attends religious service: Not on file    Active member of club or organization: Not on file    Attends meetings of clubs or organizations: Not on file    Relationship status: Not on file  Other Topics Concern  . Not on file  Social History Narrative  . Not on file    Outpatient Encounter Medications as of 04/20/2019  Medication Sig  . amLODipine (NORVASC) 5 MG tablet TAKE 2 TABLETS BY MOUTH ONCE DAILY  . aspirin 81 MG tablet Take 81 mg by mouth daily.  [name] atorvastatin (LIPITOR) 10 MG tablet Take 1 tablet (10 mg total) by mouth daily. Further refills per her primary care  . Cholecalciferol (VITAMIN D-3) 1000 units CAPS Take 1 capsule by mouth daily.  . clopidogrel (PLAVIX) 75 MG tablet Take 1 tablet (75 mg total) by mouth daily.  . Cyanocobalamin (VITAMIN B 12) 100 MCG LOZG Take 1 tablet by mouth daily.   .  furosemide (LASIX) 20 MG tablet Take 1 tablet (20 mg total) by mouth daily.  [name] levothyroxine (SYNTHROID) 88 MCG tablet TAKE 1 TABLET BY MOUTH ONCE DAILY ON AN EMPTY STOMACH. WAIT 30 MINUTES BEFORE TAKING OTHER MEDS.  . Multiple Vitamins-Minerals (MULTIVITAMIN WITH MINERALS) tablet Take 1 tablet by mouth daily.  . nitroGLYCERIN (NITROSTAT) 0.4 MG SL tablet Place 1 tablet (0.4 mg total) every 5 (five) minutes as needed under the  tongue for chest pain.  [name] Potassium 99 MG TABS Take 99 mg by mouth daily.   . predniSONE (DELTASONE) 10 MG tablet 6 tablets on Day 1 , then reduce by 1 tablet daily until gone  . RESTASIS 0.05 % ophthalmic emulsion Place 1 drop into both eyes 2 (two) times daily.   . temazepam (RESTORIL) 15 MG capsule TAKE 1 CAPSULE BY MOUTH AT BEDTIME AS NEEDED SLEEP (Patient taking differently: Take 15 mg by mouth at bedtime as needed for sleep. )   No facility-administered encounter medications on file as of 04/20/2019.     Activities of Daily Living In your present state of health, do you have any difficulty performing the following activities: 04/20/2019 01/07/2019  Hearing? Y N  Vision? N N  Difficulty concentrating or making decisions? N N  Walking or climbing stairs? N N  Dressing or bathing? N N  Doing errands, shopping? N -  Preparing Food and eating ? N -  Using the Toilet? N -  In the past six months, have you accidently leaked urine? N -  Do you have problems with loss of bowel control? N -  Managing your Medications? N -  Managing your Finances? N -  Housekeeping or managing your Housekeeping? N -  Some recent data might be hidden    Patient Care Team: [name], MD as PCP - General (Internal Medicine)    Assessment:   This is a routine wellness examination for [place].  I connected with patient 04/20/19 at 12:00 PM EDT by an audio enabled telemedicine application and verified that I am speaking with the correct person using two identifiers. Patient stated  full name and DOB. Patient gave permission to continue with virtual visit. Patient's location was at home and Nurse's location was at [place] office.   Health Maintenance Due: Influenza vaccine 2020- discussed; to be completed in season with doctor or local pharmacy.   Update all pending maintenance due as appropriate.   See completed HM at the end of note.   Eye: Visual acuity not assessed. Virtual visit.   Dental: Visits every 6 months.    Hearing: Difficulty hearing L ear. Followed by pcp and ENT.  Hearing aids- No.  Safety:  Patient feels safe at home- yes Patient does have smoke detectors at home- yes Patient does wear sunscreen or protective clothing when in direct sunlight - yes Patient does wear seat belt when in a moving vehicle - yes Patient drives- yes Adequate lighting in walkways free from debris- yes Grab bars and handrails used as appropriate- yes Ambulates with no assistive device Cell phone on person when ambulating outside of the home- yes  Social: Alcohol intake - yes, occ Smoking history- former   Smokers in home? none Illicit drug use? none  Depression: PHQ 2 &9 complete. See screening below. Denies irritability, anhedonia, sadness/tearfullness.  Stable.   Falls: See screening below.    Medication: Taking as directed and without issues.   Covid-19: Precautions and sickness symptoms discussed. Wears mask, social distancing, hand hygiene as appropriate.   Activities of Daily Living Patient denies needing assistance with: household chores, feeding themselves, getting from bed to chair, getting to the toilet, bathing/showering, dressing, managing money, or preparing meals.   Memory: Patient is alert. Patient denies difficulty focusing or concentrating. Correctly identified the president of the [place], season and recall.  BMI- discussed the importance of a healthy diet, water intake and the benefits of aerobic exercise.  Neurosurgeon  provided.  Physical activity- Water aerobics 2 times weekly, 45 minutes. Yard work.   Diet: Regular  Water: good intake  Advanced Directive: End of life planning; Advanced aging; Advanced directives discussed.  No HCPOA/Living Will.  Additional information declined at this time.  Other Providers Patient Care Team: [name], MD as PCP - General (Internal Medicine)  Exercise Activities and Dietary recommendations Current Exercise Habits: Home exercise routine, Time (Minutes): 45, Frequency (Times/Week): 2, Weekly Exercise (Minutes/Week): 90, Intensity: Mild  Goals    . Maintain Healthy Lifestyle     Healthy diet Stay hydrated Exercise Keep scheduled visits with pcp        Fall Risk Fall Risk  04/20/2019 01/15/2018 12/09/2017 11/01/2016 03/30/2016  Falls in the past year? 0 Yes Yes No No  Comment - - - - Emmi Telephone Survey: data to providers prior to load  Number falls in past yr: - 1 1 - -  Injury with Fall? - (No Data) Yes - -  Comment - Followed by pcp - - -  Follow up - Falls prevention discussed - - -   Timed Get Up and Go performed: no, virtual visit  Depression Screen PHQ 2/9 Scores 04/20/2019 01/15/2018 11/01/2016 10/08/2014  PHQ - 2 Score 0 0 0 0     Cognitive Function MMSE - Mini Mental State Exam 01/15/2018  Orientation to time 5  Orientation to Place 5  Registration 3  Attention/ Calculation 5  Recall 3  Language- name 2 objects 2  Language- repeat 1  Language- follow 3 step command 3  Language- read & follow direction 1  Write a sentence 1  Copy design 1  Total score 30     6CIT Screen 04/20/2019 11/01/2016  What Year? 0 points 0 points  What month? 0 points 0 points  What time? 0 points 0 points  Count back from 20 0 points 0 points  Months in reverse 0 points 0 points  Repeat phrase 0 points 0 points  Total Score 0 0    Immunization History  Administered Date(s) Administered  . Influenza Split 07/17/2012, 05/22/2013  . Influenza, High Dose  Seasonal PF 04/28/2015, 05/30/2016, 07/03/2017, 04/07/2018  . Pneumococcal Conjugate-13 09/10/2013  . Pneumococcal Polysaccharide-23 09/10/2008, 10/08/2014  . Tdap 10/07/2013   Screening Tests Health Maintenance  Topic Date Due  . INFLUENZA VACCINE  10/28/2019 (Originally 02/28/2019)  . TETANUS/TDAP  10/08/2023  . DEXA SCAN  Completed  . PNA vac Low Risk Adult  Completed       Plan:    Keep all routine maintenance appointments.   Appointment with your doctor up @ 1:30 today  Medicare Attestation I have personally reviewed: The patient's medical and social history Their use of alcohol, tobacco or illicit drugs Their current medications and supplements The patient's functional ability including ADLs,fall risks, home safety risks, cognitive, and hearing and visual impairment Diet and physical activities Evidence for depression   In addition, I have reviewed and discussed with patient certain preventive protocols, quality metrics, and best practice recommendations. A written personalized care plan for preventive services as well as general preventive health recommendations were provided to patient via mail.     OBrien-Blaney, Ethon Wymer L, LPN  [id number]    I have reviewed the above information and agree with above.   Deborra Medina, MD

## 2019-04-20 NOTE — Assessment & Plan Note (Signed)
Reasonable controlled on current regimen for her age  Renal function stable, no changes today.

## 2019-04-20 NOTE — Assessment & Plan Note (Addendum)
Per patient, resolved with one dose of seroquel. Normal MRI/MRA, but her hearing loss in the left ear has not improved.  Refer to [facility]NT per patient request for second opinion

## 2019-04-20 NOTE — Patient Instructions (Signed)
I have made the referral to [facility] for a second opinion on your left year hearing loss  I'm glad your headaches have resolved  Continue Restoril for insomnia

## 2019-04-27 DIAGNOSIS — H9122 Sudden idiopathic hearing loss, left ear: Secondary | ICD-10-CM | POA: Diagnosis not present

## 2019-05-27 DIAGNOSIS — Z8673 Personal history of transient ischemic attack (TIA), and cerebral infarction without residual deficits: Secondary | ICD-10-CM | POA: Diagnosis not present

## 2019-05-27 DIAGNOSIS — H9120 Sudden idiopathic hearing loss, unspecified ear: Secondary | ICD-10-CM | POA: Diagnosis not present

## 2019-05-27 DIAGNOSIS — H9122 Sudden idiopathic hearing loss, left ear: Secondary | ICD-10-CM | POA: Diagnosis not present

## 2019-05-27 DIAGNOSIS — R930 Abnormal findings on diagnostic imaging of skull and head, not elsewhere classified: Secondary | ICD-10-CM | POA: Diagnosis not present

## 2019-05-27 DIAGNOSIS — G319 Degenerative disease of nervous system, unspecified: Secondary | ICD-10-CM | POA: Diagnosis not present

## 2019-06-10 ENCOUNTER — Other Ambulatory Visit: Payer: Self-pay | Admitting: Internal Medicine

## 2019-06-10 ENCOUNTER — Other Ambulatory Visit (INDEPENDENT_AMBULATORY_CARE_PROVIDER_SITE_OTHER): Payer: Self-pay | Admitting: Vascular Surgery

## 2019-06-10 DIAGNOSIS — I10 Essential (primary) hypertension: Secondary | ICD-10-CM

## 2019-06-27 ENCOUNTER — Other Ambulatory Visit: Payer: Self-pay | Admitting: Internal Medicine

## 2019-06-29 NOTE — Telephone Encounter (Signed)
Refilled: 12/12/2018 Last OV: 04/20/2019 Next OV: not scheduled

## 2019-07-10 DIAGNOSIS — H04123 Dry eye syndrome of bilateral lacrimal glands: Secondary | ICD-10-CM | POA: Diagnosis not present

## 2019-07-21 ENCOUNTER — Other Ambulatory Visit: Payer: Self-pay | Admitting: Internal Medicine

## 2019-07-27 ENCOUNTER — Ambulatory Visit (INDEPENDENT_AMBULATORY_CARE_PROVIDER_SITE_OTHER): Payer: Medicare Other | Admitting: Nurse Practitioner

## 2019-07-27 ENCOUNTER — Other Ambulatory Visit: Payer: Self-pay

## 2019-07-27 ENCOUNTER — Ambulatory Visit (INDEPENDENT_AMBULATORY_CARE_PROVIDER_SITE_OTHER): Payer: Medicare Other

## 2019-07-27 ENCOUNTER — Encounter (INDEPENDENT_AMBULATORY_CARE_PROVIDER_SITE_OTHER): Payer: Self-pay | Admitting: Nurse Practitioner

## 2019-07-27 VITALS — BP 138/72 | HR 70 | Resp 15 | Wt 152.8 lb

## 2019-07-27 DIAGNOSIS — I150 Renovascular hypertension: Secondary | ICD-10-CM

## 2019-07-27 DIAGNOSIS — I701 Atherosclerosis of renal artery: Secondary | ICD-10-CM

## 2019-07-31 ENCOUNTER — Encounter (INDEPENDENT_AMBULATORY_CARE_PROVIDER_SITE_OTHER): Payer: Self-pay | Admitting: Nurse Practitioner

## 2019-07-31 NOTE — Progress Notes (Signed)
SUBJECTIVE:  Patient ID: Kathryn Wise, female    DOB: 1930/03/09, 86 y.o.   MRN: [id number] Chief Complaint  Patient presents with  . Follow-up    ultrasound follow up    HPI  Kathryn Wise is a 86 y.o. female The patient returns to the office for followup and review of the noninvasive studies regarding renal vascular hypertension and renal artery stenosis. There have been no interval changes in the patient's blood pressure control.  He denies any major changes in is medications.  The patient denies headache or flushing.  No flank or unusual back pain.    There have been no significant changes to the patient's overall health care.  No interval shortening of the patient's walking distance or new symptoms consistent with claudication.  The patient denies the  development of rest pain symptoms. No new ulcers or wounds have occurred since the last visit.  The patient denies amaurosis fugax or recent TIA symptoms. There are no recent neurological changes noted.  Duplex ultrasound of the right renal artery reveal 1-59% with no stenosis of the left renal artery.  Velocities consistent with previous studies on 01/01/2019.     Past Medical History:  Diagnosis Date  . Arthritis   . Dysphagia, pharyngoesophageal phase 09/10/2013   Upper GI study with barium swallow was done at  [facility] Mar 2015   No reflux seen  Small irreducible hiatal hernia  Mild changes of presbyeophagus (abnormal contractions of the esophagus that occur with aging) No strictures Normal gastric emptying  Incomplete visualization of stomach fold due to patient's inability turn    . GERD (gastroesophageal reflux disease)   . Heart murmur    has had years and years  . History of shingles Dec 2013   treated with steroids , post op from shoulder surgery  . Hypertension   . Hypothyroidism   . Major depressive disorder, single episode 11/05/2015  . Osteoarthritis of left shoulder 07/14/2012  . Polymyalgia rheumatica (HCC)      Past Surgical History:  Procedure Laterality Date  . ARTHOSCOPIC ROTAOR CUFF REPAIR     LEFT  10+  YEARS    . CATARACT EXTRACTION W/ INTRAOCULAR LENS IMPLANT     RIGHT EYE  . CATARACT EXTRACTION W/PHACO Left 03/28/2016   Procedure: CATARACT EXTRACTION PHACO AND INTRAOCULAR LENS PLACEMENT (IOC);  Surgeon: Leandrew Koyanagi, MD;  Location: [place];  Service: Ophthalmology;  Laterality: Left;  TORIC  . FACELIFT    . FOOT SURGERY     rt foot   TUMOR REMOVED  . JOINT REPLACEMENT Left Dec 2013   shoulder  . LEFT HEART CATH AND CORONARY ANGIOGRAPHY N/A 06/12/2017   Procedure: LEFT HEART CATH AND CORONARY ANGIOGRAPHY;  Surgeon: Minna Merritts, MD;  Location: [place] [facility];  Service: Cardiovascular;  Laterality: N/A;  . RENAL ANGIOGRAPHY Left 01/07/2019   Procedure: RENAL ANGIOGRAPHY;  Surgeon: Katha Cabal, MD;  Location: [place] [facility];  Service: Cardiovascular;  Laterality: Left;  . RENAL ARTERY STENT    . SKIN CANCER EXCISION    . TOTAL SHOULDER ARTHROPLASTY  07/14/2012   Procedure: TOTAL SHOULDER ARTHROPLASTY;  Surgeon: Johnny Bridge, MD;  Location: [place];  Service: Orthopedics;  Laterality: Left;    Social History   Socioeconomic History  . Marital status: Widowed    Spouse name: Not on file  . Number of children: Not on file  . Years of education: Not on file  . Highest  education level: Not on file  Occupational History  . Not on file  Tobacco Use  . Smoking status: Former Smoker    Types: Cigarettes    Quit date: 07/30/1972    Years since quitting: 47.0  . Smokeless tobacco: Never Used  . Tobacco comment: smoked for only a few months  Substance and Sexual Activity  . Alcohol use: Yes    Comment: OCCAS WINE   . Drug use: No  . Sexual activity: Never  Other Topics Concern  . Not on file  Social History Narrative  . Not on file   Social Determinants of Health   Financial Resource Strain:   . Difficulty of Paying Living Expenses:  Not on file  Food Insecurity:   . Worried About Charity fundraiser in the Last Year: Not on file  . Ran Out of Food in the Last Year: Not on file  Transportation Needs:   . Lack of Transportation (Medical): Not on file  . Lack of Transportation (Non-Medical): Not on file  Physical Activity:   . Days of Exercise per Week: Not on file  . Minutes of Exercise per Session: Not on file  Stress:   . Feeling of Stress : Not on file  Social Connections:   . Frequency of Communication with Friends and Family: Not on file  . Frequency of Social Gatherings with Friends and Family: Not on file  . Attends Religious Services: Not on file  . Active Member of Clubs or Organizations: Not on file  . Attends Archivist Meetings: Not on file  . Marital Status: Not on file  Intimate Partner Violence:   . Fear of Current or Ex-Partner: Not on file  . Emotionally Abused: Not on file  . Physically Abused: Not on file  . Sexually Abused: Not on file    Family History  Problem Relation Age of Onset  . Cancer Daughter        Breast  . Breast cancer Daughter 81  . Cancer Son     Allergies  Allergen Reactions  . Alendronate Swelling    And nausea  And nausea  And nausea   . Metoprolol Other (See Comments)    lethargy  . Oxycodone     Other reaction(s): Other (See Comments) Altered mental status  . Oxycontin [Oxycodone Hcl]     Altered mental status     Review of Systems   Review of Systems: Negative Unless Checked Constitutional: [] Weight loss  [] Fever  [] Chills Cardiac: [] Chest pain   []  Atrial Fibrillation  [] Palpitations   [] Shortness of breath when laying flat   [] Shortness of breath with exertion. [] Shortness of breath at rest Vascular:  [] Pain in legs with walking   [] Pain in legs with standing [] Pain in legs when laying flat   [] Claudication    [] Pain in feet when laying flat    [] History of DVT   [] Phlebitis   [] Swelling in legs   [] Varicose veins   [] Non-healing  ulcers Pulmonary:   [] Uses home oxygen   [] Productive cough   [] Hemoptysis   [] Wheeze  [] COPD   [] Asthma Neurologic:  [] Dizziness   [] Seizures  [] Blackouts [] History of stroke   [] History of TIA  [] Aphasia   [] Temporary Blindness   [] Weakness or numbness in arm   [] Weakness or numbness in leg Musculoskeletal:   [] Joint swelling   [] Joint pain   [] Low back pain  []  History of Knee Replacement [x] Arthritis [] back Surgeries  []  Spinal Stenosis  Hematologic:  [] Easy bruising  [] Easy bleeding   [] Hypercoagulable state   [] Anemic Gastrointestinal:  [] Diarrhea   [] Vomiting  [x] Gastroesophageal reflux/heartburn   [] Difficulty swallowing. [] Abdominal pain Genitourinary:  [] Chronic kidney disease   [] Difficult urination  [] Anuric   [] Blood in urine [] Frequent urination  [] Burning with urination   [] Hematuria Skin:  [] Rashes   [] Ulcers [] Wounds Psychological:  [] History of anxiety   []  History of major depression  []  Memory Difficulties      OBJECTIVE:   Physical Exam  BP 138/72 (BP Location: Left Arm)   Pulse 70   Resp 15   Wt 152 lb 12.8 oz (69.3 kg)   BMI 24.66 kg/m   Gen: WD/WN, NAD Head: [place]/AT, No temporalis wasting.  Ear/Nose/Throat: Hearing grossly intact, nares w/o erythema or drainage Eyes: PER, EOMI, sclera nonicteric.  Neck: Supple, no masses.  No JVD.  Pulmonary:  Good air movement, no use of accessory muscles.  Cardiac: RRR Vascular:  Vessel Right Left  Radial Palpable Palpable   Gastrointestinal: soft, non-distended. No guarding/no peritoneal signs.  Musculoskeletal: M/S 5/5 throughout.  No deformity or atrophy.  Neurologic: Pain and light touch intact in extremities.  Symmetrical.  Speech is fluent. Motor exam as listed above. Psychiatric: Judgment intact, Mood & affect appropriate for pt's clinical situation. Dermatologic: No Venous rashes. No Ulcers Noted.  No changes consistent with cellulitis.       ASSESSMENT AND PLAN:  1. Renal artery stenosis  (HCC) Recommend:  The patient has evidence of atherosclerotic changes of the renal artery.  At this time the patient's blood pressure is fairly well controlled.  Patient does not need angiography of the renal artery given the good control of his hypertension.    However, if at any point the patient's BP becomes acutely worse then intervention would be strongly encouraged.  The patient voices understanding of this plan and agrees.   Patient would follow up in 6 months   - VAS US RENAL ARTERY DUPLEX; Future  2. Renovascular hypertension Continue antihypertensive medications as already ordered, these medications have been reviewed and there are no changes at this time.    Current Outpatient Medications on File Prior to Visit  Medication Sig Dispense Refill  . amLODipine (NORVASC) 5 MG tablet TAKE 2 TABLETS BY MOUTH ONCE DAILY 180 tablet 1  . Cholecalciferol (VITAMIN D-3) 1000 units CAPS Take 1 capsule by mouth daily.    . clopidogrel (PLAVIX) 75 MG tablet TAKE 1 TABLET BY MOUTH ONCE DAILY 30 tablet 4  . Cyanocobalamin (VITAMIN B 12) 100 MCG LOZG Take 1 tablet by mouth daily.     . furosemide (LASIX) 20 MG tablet Take 1 tablet (20 mg total) by mouth daily. 90 tablet 1  . levothyroxine (SYNTHROID) 88 MCG tablet TAKE 1 TABLET BY MOUTH ONCE EVERY MORNING 90 tablet 1  . nitroGLYCERIN (NITROSTAT) 0.4 MG SL tablet Place 1 tablet (0.4 mg total) every 5 (five) minutes as needed under the tongue for chest pain. 30 tablet 2  . Potassium 99 MG TABS Take 99 mg by mouth daily.     . RESTASIS 0.05 % ophthalmic emulsion Place 1 drop into both eyes 2 (two) times daily.     . temazepam (RESTORIL) 15 MG capsule Take 1 capsule (15 mg total) by mouth at bedtime as needed for sleep. 30 capsule 2  . TURMERIC PO Take by mouth.     No current facility-administered medications on file prior to visit.    There are no Patient  Instructions on file for this visit. No follow-ups on file.   Kris Hartmann,  NP  This note was completed with Sales executive.  Any errors are purely unintentional.

## 2019-08-13 ENCOUNTER — Other Ambulatory Visit (INDEPENDENT_AMBULATORY_CARE_PROVIDER_SITE_OTHER): Payer: Self-pay | Admitting: Vascular Surgery

## 2019-08-14 ENCOUNTER — Other Ambulatory Visit: Payer: Self-pay

## 2019-08-14 ENCOUNTER — Emergency Department: Payer: Medicare Other

## 2019-08-14 ENCOUNTER — Emergency Department
Admission: EM | Admit: 2019-08-14 | Discharge: 2019-08-14 | Disposition: A | Discharge status: Home / Self Care | Payer: Medicare Other | Attending: Emergency Medicine | Admitting: Emergency Medicine

## 2019-08-14 DIAGNOSIS — E039 Hypothyroidism, unspecified: Secondary | ICD-10-CM | POA: Diagnosis not present

## 2019-08-14 DIAGNOSIS — Z79899 Other long term (current) drug therapy: Secondary | ICD-10-CM | POA: Diagnosis not present

## 2019-08-14 DIAGNOSIS — Z87891 Personal history of nicotine dependence: Secondary | ICD-10-CM | POA: Insufficient documentation

## 2019-08-14 DIAGNOSIS — M7989 Other specified soft tissue disorders: Secondary | ICD-10-CM

## 2019-08-14 DIAGNOSIS — I10 Essential (primary) hypertension: Secondary | ICD-10-CM | POA: Diagnosis not present

## 2019-08-14 DIAGNOSIS — I259 Chronic ischemic heart disease, unspecified: Secondary | ICD-10-CM | POA: Insufficient documentation

## 2019-08-14 DIAGNOSIS — M79605 Pain in left leg: Secondary | ICD-10-CM

## 2019-08-14 DIAGNOSIS — Z7902 Long term (current) use of antithrombotics/antiplatelets: Secondary | ICD-10-CM | POA: Insufficient documentation

## 2019-08-14 DIAGNOSIS — R7989 Other specified abnormal findings of blood chemistry: Secondary | ICD-10-CM | POA: Insufficient documentation

## 2019-08-14 DIAGNOSIS — R2242 Localized swelling, mass and lump, left lower limb: Secondary | ICD-10-CM | POA: Diagnosis not present

## 2019-08-14 DIAGNOSIS — M79662 Pain in left lower leg: Secondary | ICD-10-CM | POA: Insufficient documentation

## 2019-08-14 LAB — CBC WITH DIFFERENTIAL/PLATELET
Abs Immature Granulocytes: 0.02 10*3/uL (ref 0.00–0.07)
Basophils Absolute: 0 10*3/uL (ref 0.0–0.1)
Basophils Relative: 0 %
Eosinophils Absolute: 0.1 10*3/uL (ref 0.0–0.5)
Eosinophils Relative: 1 %
HCT: 39.3 % (ref 36.0–46.0)
Hemoglobin: 12.5 g/dL (ref 12.0–15.0)
Immature Granulocytes: 0 %
Lymphocytes Relative: 28 %
Lymphs Abs: 2.1 10*3/uL (ref 0.7–4.0)
MCH: 26.9 pg (ref 26.0–34.0)
MCHC: 31.8 g/dL (ref 30.0–36.0)
MCV: 84.5 fL (ref 80.0–100.0)
Monocytes Absolute: 0.9 10*3/uL (ref 0.1–1.0)
Monocytes Relative: 12 %
Neutro Abs: 4.3 10*3/uL (ref 1.7–7.7)
Neutrophils Relative %: 59 %
Platelets: 183 10*3/uL (ref 150–400)
RBC: 4.65 MIL/uL (ref 3.87–5.11)
RDW: 14.5 % (ref 11.5–15.5)
WBC: 7.4 10*3/uL (ref 4.0–10.5)
nRBC: 0 % (ref 0.0–0.2)

## 2019-08-14 LAB — COMPREHENSIVE METABOLIC PANEL
ALT: 10 U/L (ref 0–44)
AST: 18 U/L (ref 15–41)
Albumin: 3.7 g/dL (ref 3.5–5.0)
Alkaline Phosphatase: 60 U/L (ref 38–126)
Anion gap: 13 (ref 5–15)
BUN: 18 mg/dL (ref 8–23)
CO2: 25 mmol/L (ref 22–32)
Calcium: 9.8 mg/dL (ref 8.9–10.3)
Chloride: 100 mmol/L (ref 98–111)
Creatinine, Ser: 0.88 mg/dL (ref 0.44–1.00)
GFR calc Af Amer: >60 mL/min (ref >60)
GFR calc non Af Amer: 59 mL/min — ABNORMAL LOW (ref >60)
Glucose, Bld: 106 mg/dL — ABNORMAL HIGH (ref 70–99)
Potassium: 3.9 mmol/L (ref 3.5–5.1)
Sodium: 138 mmol/L (ref 135–145)
Total Bilirubin: 0.6 mg/dL (ref 0.3–1.2)
Total Protein: 7.2 g/dL (ref 6.5–8.1)

## 2019-08-14 LAB — APTT: aPTT: 28 seconds (ref 24–36)

## 2019-08-14 LAB — PROTIME-INR
INR: 1 (ref 0.8–1.2)
Prothrombin Time: 13 seconds (ref 11.4–15.2)

## 2019-08-14 LAB — BRAIN NATRIURETIC PEPTIDE: B Natriuretic Peptide: 588 pg/mL — ABNORMAL HIGH (ref 0.0–100.0)

## 2019-08-14 MED ORDER — TRAMADOL HCL 50 MG PO TABS: 50.0000 mg | ORAL_TABLET | Freq: Four times a day (QID) | ORAL | 0 refills | Status: AC | PRN

## 2019-08-14 NOTE — ED Provider Notes (Signed)
Emergency Department Provider Note  ____________________________________________  Time seen: Approximately 3:36 PM  I have reviewed the triage vital signs and the nursing notes.   HISTORY  Chief Complaint Leg Pain   Historian Patient     HPI Kathryn Wise is a 86 y.o. female presents to the emergency department with new onset left calf pain and left lower extremity swelling for the past 2 to 3 days.  Patient states that pain is worse with ambulation and relieved with rest.  No numbness or tingling along the posterior aspect of the left leg.  Patient denies shortness of breath, chest pain or discomfort with deep inspiration.  Patient denies recent travel or prolonged immobilization.  No personal history of malignancy.  She denies prior DVT or PE.  Patient takes Plavix daily but is not otherwise anticoagulated.  She denies fever or chills.  No history of gout or CHF.  No similar symptoms in the past.  No falls or mechanisms of trauma.   Past Medical History:  Diagnosis Date  . Arthritis   . Dysphagia, pharyngoesophageal phase 09/10/2013   Upper GI study with barium swallow was done at  [facility] Mar 2015   No reflux seen  Small irreducible hiatal hernia  Mild changes of presbyeophagus (abnormal contractions of the esophagus that occur with aging) No strictures Normal gastric emptying  Incomplete visualization of stomach fold due to patient's inability turn    . GERD (gastroesophageal reflux disease)   . Heart murmur    has had years and years  . History of shingles Dec 2013   treated with steroids , post op from shoulder surgery  . Hypertension   . Hypothyroidism   . Major depressive disorder, single episode 11/05/2015  . Osteoarthritis of left shoulder 07/14/2012  . Polymyalgia rheumatica ([place])      Immunizations up to date:  Yes.     Past Medical History:  Diagnosis Date  . Arthritis   . Dysphagia, pharyngoesophageal phase 09/10/2013   Upper GI study with barium swallow was  done at  [facility] Mar 2015   No reflux seen  Small irreducible hiatal hernia  Mild changes of presbyeophagus (abnormal contractions of the esophagus that occur with aging) No strictures Normal gastric emptying  Incomplete visualization of stomach fold due to patient's inability turn    . GERD (gastroesophageal reflux disease)   . Heart murmur    has had years and years  . History of shingles Dec 2013   treated with steroids , post op from shoulder surgery  . Hypertension   . Hypothyroidism   . Major depressive disorder, single episode 11/05/2015  . Osteoarthritis of left shoulder 07/14/2012  . Polymyalgia rheumatica [facility])     Patient Active Problem List   Diagnosis Date Noted  . Recurrent occipital headache 03/07/2019  . Hearing loss due to cerumen impaction, left 01/23/2019  . Renal artery stenosis ([place]) 01/01/2019  . Lymphedema 01/01/2019  . PAD (peripheral artery disease) ([place]) 01/01/2019  . History of CVA (cerebrovascular accident) without residual deficits 11/13/2018  . Anemia, unspecified 11/13/2018  . Tinnitus aurium, bilateral 10/19/2018  . Blurred vision, bilateral 10/19/2018  . Insomnia due to psychological stress 10/07/2018  . Food intolerance in adult 10/07/2018  . Skin lesion of hand 10/07/2018  . Myalgia due to statin 12/10/2017  . Hyperlipidemia LDL goal <130 10/03/2017  . Hiatal hernia 07/03/2017  . Coronary artery disease 07/03/2017  . Hospital discharge follow-up 07/03/2017  . Chest pain 06/10/2017  . Pain  in left wrist 09/20/2016  . Breast cancer screening by mammogram 09/20/2016  . Hypothyroidism 04/28/2015  . Hypertension 01/13/2014  . Osteopenia 10/25/2013  . GERD (gastroesophageal reflux disease) 09/11/2013  . Encounter for preventive health examination 09/11/2013  . Heart murmur 11/28/2012  . History of shingles   . Polymyalgia rheumatica ([place]) 11/18/2012  . Edema 11/08/2012  . Osteoarthritis of left shoulder 07/14/2012    Past Surgical History:   Procedure Laterality Date  . ARTHOSCOPIC ROTAOR CUFF REPAIR     LEFT  10+  YEARS    . CATARACT EXTRACTION W/ INTRAOCULAR LENS IMPLANT     RIGHT EYE  . CATARACT EXTRACTION W/PHACO Left 03/28/2016   Procedure: CATARACT EXTRACTION PHACO AND INTRAOCULAR LENS PLACEMENT (IOC);  Surgeon: Leandrew Koyanagi, MD;  Location: [place];  Service: Ophthalmology;  Laterality: Left;  TORIC  . FACELIFT    . FOOT SURGERY     rt foot   TUMOR REMOVED  . JOINT REPLACEMENT Left Dec 2013   shoulder  . LEFT HEART CATH AND CORONARY ANGIOGRAPHY N/A 06/12/2017   Procedure: LEFT HEART CATH AND CORONARY ANGIOGRAPHY;  Surgeon: Minna Merritts, MD;  Location: [place] [facility];  Service: Cardiovascular;  Laterality: N/A;  . RENAL ANGIOGRAPHY Left 01/07/2019   Procedure: RENAL ANGIOGRAPHY;  Surgeon: Katha Cabal, MD;  Location: [place] [facility];  Service: Cardiovascular;  Laterality: Left;  . RENAL ARTERY STENT    . SKIN CANCER EXCISION    . TOTAL SHOULDER ARTHROPLASTY  07/14/2012   Procedure: TOTAL SHOULDER ARTHROPLASTY;  Surgeon: Johnny Bridge, MD;  Location: [place];  Service: Orthopedics;  Laterality: Left;    Prior to Admission medications   Medication Sig Start Date End Date Taking? Authorizing Provider  amLODipine (NORVASC) 5 MG tablet TAKE 2 TABLETS BY MOUTH ONCE DAILY 06/10/19   [name], MD  Cholecalciferol (VITAMIN D-3) 1000 units CAPS Take 1 capsule by mouth daily.    [provider]  clopidogrel (PLAVIX) 75 MG tablet TAKE 1 TABLET BY MOUTH ONCE DAILY 08/13/19   Schnier, Dolores Lory, MD  Cyanocobalamin (VITAMIN B 12) 100 MCG LOZG Take 1 tablet by mouth daily.     [provider]  furosemide (LASIX) 20 MG tablet Take 1 tablet (20 mg total) by mouth daily. 12/01/18   [name], MD  levothyroxine (SYNTHROID) 88 MCG tablet TAKE 1 TABLET BY MOUTH ONCE EVERY MORNING 07/21/19   [name], MD  nitroGLYCERIN (NITROSTAT) 0.4 MG SL tablet Place 1 tablet (0.4  mg total) every 5 (five) minutes as needed under the tongue for chest pain. 06/12/17   Demetrios Loll, MD  Potassium 99 MG TABS Take 99 mg by mouth daily.     [provider]  RESTASIS 0.05 % ophthalmic emulsion Place 1 drop into both eyes 2 (two) times daily.  10/22/15   [provider]  temazepam (RESTORIL) 15 MG capsule Take 1 capsule (15 mg total) by mouth at bedtime as needed for sleep. 06/29/19   [name], MD  traMADol (ULTRAM) 50 MG tablet Take 1 tablet (50 mg total) by mouth every 6 (six) hours as needed for up to 3 days. 08/14/19 08/17/19  Lannie Fields, PA-C  TURMERIC PO Take by mouth.    [provider]    Allergies Alendronate, Metoprolol, Oxycodone, and Oxycontin [oxycodone hcl]  Family History  Problem Relation Age of Onset  . Cancer Daughter        Breast  .  Breast cancer Daughter 63  . Cancer Son     Social History Social History   Tobacco Use  . Smoking status: Former Smoker    Types: Cigarettes    Quit date: 07/30/1972    Years since quitting: 47.0  . Smokeless tobacco: Never Used  . Tobacco comment: smoked for only a few months  Substance Use Topics  . Alcohol use: Yes    Comment: OCCAS WINE   . Drug use: No     Review of Systems  Constitutional: No fever/chills Eyes:  No discharge ENT: No upper respiratory complaints. Respiratory: no cough. No SOB/ use of accessory muscles to breath Gastrointestinal:   No nausea, no vomiting.  No diarrhea.  No constipation. Musculoskeletal: Patient has left lower extremity pain.  Skin: Negative for rash, abrasions, lacerations, ecchymosis.   ____________________________________________   PHYSICAL EXAM:  VITAL SIGNS: ED Triage Vitals [08/14/19 1431]  Enc Vitals Group     BP (!) 141/77     Pulse Rate 67     Resp 18     Temp 98.5 F (36.9 C)     Temp src      SpO2 97 %     Weight 152 lb 12.8 oz (69.3 kg)     Height 5\' 6"  (1.676 m)     Head Circumference      Peak Flow       Pain Score 5     Pain Loc      Pain Edu?      Excl. in [place]?      Constitutional: Alert and oriented. Well appearing and in no acute distress. Eyes: Conjunctivae are normal. PERRL. EOMI. Head: Atraumatic. ENT: Cardiovascular: Normal rate, regular rhythm. Normal S1 and S2.  Good peripheral circulation. Respiratory: Normal respiratory effort without tachypnea or retractions. Lungs CTAB. Good air entry to the bases with no decreased or absent breath sounds Gastrointestinal: Bowel sounds x 4 quadrants. Soft and nontender to palpation. No guarding or rigidity. No distention. Musculoskeletal: Full range of motion to all extremities. No obvious deformities noted.  Patient has tenderness to palpation along left calf and has 1+ pitting edema of left lower extremity. Neurologic:  Normal for age. No gross focal neurologic deficits are appreciated.  Skin: Patient has scattered petechiae along the left lower extremity that patient states is new. Psychiatric: Mood and affect are normal for age. Speech and behavior are normal.   ____________________________________________   LABS (all labs ordered are listed, but only abnormal results are displayed)  Labs Reviewed  COMPREHENSIVE METABOLIC PANEL - Abnormal; Notable for the following components:      Result Value   Glucose, Bld 106 (*)    GFR calc non Af Amer 59 (*)    All other components within normal limits  BRAIN NATRIURETIC PEPTIDE - Abnormal; Notable for the following components:   B Natriuretic Peptide 588.0 (*)    All other components within normal limits  CBC WITH DIFFERENTIAL/PLATELET  PROTIME-INR  APTT   ____________________________________________  EKG   ____________________________________________  RADIOLOGY Unk Pinto, personally viewed and evaluated these images (plain radiographs) as part of my medical decision making, as well as reviewing the written report by the radiologist.    US Venous Img Lower Unilateral  Left (DVT)  Result Date: 08/14/2019 CLINICAL DATA:  Pain and swelling x1 week EXAM: LEFT LOWER EXTREMITY VENOUS DOPPLER ULTRASOUND TECHNIQUE: Gray-scale sonography with compression, as well as color and duplex ultrasound, were performed to evaluate the deep venous  system(s) from the level of the common femoral vein through the popliteal and proximal calf veins. COMPARISON:  None FINDINGS: VENOUS Normal compressibility of the common femoral, superficial femoral, and popliteal veins, as well as the visualized calf veins. Visualized portions of profunda femoral vein and great saphenous vein unremarkable. No filling defects to suggest DVT on grayscale or color Doppler imaging. Doppler waveforms show normal direction of venous flow, normal respiratory phasicity and response to augmentation. Limited views of the contralateral common femoral vein are unremarkable. OTHER Elongated fluid collection extending from the posterior popliteal fossa into the proximal calf subcutaneous tissues 12 x 0.8 x 3.2 cm. Limitations: none IMPRESSION: 1. No femoropopliteal and no calf DVT in the visualized calf veins. If clinical symptoms are inconsistent or if there are persistent or worsening symptoms, further imaging (possibly involving the iliac veins) may be warranted. 2. Fluid in the posterior popliteal fossa and deep calf subcutaneous tissues suggesting ruptured Baker's cyst. Electronically Signed   By: Lucrezia Europe M.D.   On: 08/14/2019 16:47    ____________________________________________    PROCEDURES  Procedure(s) performed:     Procedures     Medications - No data to display   ____________________________________________   INITIAL IMPRESSION / ASSESSMENT AND PLAN / ED COURSE  Pertinent labs & imaging results that were available during my care of the patient were reviewed by me and considered in my medical decision making (see chart for details).      Assessment and Plan: Leg swelling:  86 year old  female presents to the emergency department with left lower extremity pain and swelling for the past 2 to 3 days.  Vital signs were reassuring at triage.  On physical exam, patient was resting comfortably.  Left calf appeared slightly larger than right with scattered petechiae.  Patient had tenderness to palpation along left calf with 1+ pitting edema.  Differential diagnosis includes venous insufficiency, DVT, thrombocytopenia...  CBC and CMP were reassuring.  PT INR and APTT were within reference range.  BNP was notably elevated at 588 with no prior testing for comparison.  Patient denies shortness of breath or increased pillow usage with no prior history of CHF.  Venous ultrasound revealed no evidence of thrombus formation with ultrasound findings suggestive of a ruptured Baker's cyst.  Findings of ruptured Baker's cyst correlate with distribution of patient's pain.  Advised patient following up with primary care regarding elevated BNP.  Caution patient to return to the emergency department in 7 to 10 days if symptoms do not start improving.  Patient was advised to use compression stockings at home and to apply ice.  She was discharged with a short course of tramadol for pain.  Return precautions were given to return to the emergency department with new or worsening symptoms such as shortness of breath, chest tightness or chest pain.  She voiced understanding and has easy access to the ED should symptoms worsen. ____________________________________________  FINAL CLINICAL IMPRESSION(S) / ED DIAGNOSES  Final diagnoses:  Left leg pain      NEW MEDICATIONS STARTED DURING THIS VISIT:  ED Discharge Orders         Ordered    traMADol (ULTRAM) 50 MG tablet  Every 6 hours PRN     08/14/19 1707              This chart was dictated using voice recognition software/Dragon. Despite best efforts to proofread, errors can occur which can change the meaning. Any change was purely  unintentional.  Vallarie Mare [place], PA-C 08/14/19 1743    Blake Divine, MD 08/17/19 2048

## 2019-08-14 NOTE — Discharge Instructions (Addendum)
Wear leggings at home. Apply ice to back of left knee 10-15 minutes at a time for 2-3 hours at a time.  Take Tramadol as needed for pain. Return to ED in ten days if symptoms are not improving or if you experience chest tightness, shortness of breath or chest pain.

## 2019-08-14 NOTE — ED Triage Notes (Signed)
Pt comes via POV from home with c/o l;eft leg pain. Pt states this started Saturday. Pt states it might be a blood clot. Pt states pain and swelling to left leg and down to ankle.  Pt has swelling noted to left leg.  Pt denies any recent injuries or long distance trips.

## 2019-08-14 NOTE — ED Notes (Signed)
Pt c/o numbness, pain, and decreased movement in left foot since Sunday. Pt aox4, nad noted. No swelling or redness noted in left foot. Pt taken to [place] at this time.

## 2019-08-17 DIAGNOSIS — L578 Other skin changes due to chronic exposure to nonionizing radiation: Secondary | ICD-10-CM | POA: Diagnosis not present

## 2019-08-17 DIAGNOSIS — Z1283 Encounter for screening for malignant neoplasm of skin: Secondary | ICD-10-CM | POA: Diagnosis not present

## 2019-08-17 DIAGNOSIS — L820 Inflamed seborrheic keratosis: Secondary | ICD-10-CM | POA: Diagnosis not present

## 2019-08-17 DIAGNOSIS — D225 Melanocytic nevi of trunk: Secondary | ICD-10-CM | POA: Diagnosis not present

## 2019-08-17 DIAGNOSIS — D692 Other nonthrombocytopenic purpura: Secondary | ICD-10-CM | POA: Diagnosis not present

## 2019-08-17 DIAGNOSIS — L821 Other seborrheic keratosis: Secondary | ICD-10-CM | POA: Diagnosis not present

## 2019-08-17 DIAGNOSIS — D1801 Hemangioma of skin and subcutaneous tissue: Secondary | ICD-10-CM | POA: Diagnosis not present

## 2019-08-17 DIAGNOSIS — Z85828 Personal history of other malignant neoplasm of skin: Secondary | ICD-10-CM | POA: Diagnosis not present

## 2019-08-17 DIAGNOSIS — L814 Other melanin hyperpigmentation: Secondary | ICD-10-CM | POA: Diagnosis not present

## 2019-08-19 DIAGNOSIS — M25562 Pain in left knee: Secondary | ICD-10-CM | POA: Diagnosis not present

## 2019-08-19 DIAGNOSIS — M25522 Pain in left elbow: Secondary | ICD-10-CM | POA: Diagnosis not present

## 2019-08-21 ENCOUNTER — Other Ambulatory Visit: Payer: Self-pay | Admitting: Internal Medicine

## 2019-08-24 ENCOUNTER — Ambulatory Visit: Payer: Medicare Other | Admitting: Internal Medicine

## 2019-08-29 ENCOUNTER — Other Ambulatory Visit: Payer: Self-pay | Admitting: Internal Medicine

## 2019-08-31 NOTE — Telephone Encounter (Signed)
Medication was discontinued in 10/2018 by a different provider.

## 2019-10-16 DIAGNOSIS — Z23 Encounter for immunization: Secondary | ICD-10-CM | POA: Diagnosis not present

## 2019-11-03 ENCOUNTER — Encounter: Payer: Self-pay | Admitting: Internal Medicine

## 2019-11-03 ENCOUNTER — Ambulatory Visit (INDEPENDENT_AMBULATORY_CARE_PROVIDER_SITE_OTHER): Payer: Medicare Other | Admitting: Internal Medicine

## 2019-11-03 ENCOUNTER — Other Ambulatory Visit: Payer: Self-pay

## 2019-11-03 VITALS — BP 150/68 | HR 82 | Temp 96.6°F | Resp 16 | Ht 66.0 in | Wt 153.8 lb

## 2019-11-03 DIAGNOSIS — Z8673 Personal history of transient ischemic attack (TIA), and cerebral infarction without residual deficits: Secondary | ICD-10-CM | POA: Diagnosis not present

## 2019-11-03 DIAGNOSIS — I6381 Other cerebral infarction due to occlusion or stenosis of small artery: Secondary | ICD-10-CM

## 2019-11-03 DIAGNOSIS — M19012 Primary osteoarthritis, left shoulder: Secondary | ICD-10-CM

## 2019-11-03 DIAGNOSIS — R011 Cardiac murmur, unspecified: Secondary | ICD-10-CM

## 2019-11-03 DIAGNOSIS — H6122 Impacted cerumen, left ear: Secondary | ICD-10-CM | POA: Diagnosis not present

## 2019-11-03 DIAGNOSIS — E034 Atrophy of thyroid (acquired): Secondary | ICD-10-CM

## 2019-11-03 DIAGNOSIS — Z7901 Long term (current) use of anticoagulants: Secondary | ICD-10-CM

## 2019-11-03 DIAGNOSIS — Z0000 Encounter for general adult medical examination without abnormal findings: Secondary | ICD-10-CM | POA: Diagnosis not present

## 2019-11-03 DIAGNOSIS — E538 Deficiency of other specified B group vitamins: Secondary | ICD-10-CM | POA: Diagnosis not present

## 2019-11-03 DIAGNOSIS — I150 Renovascular hypertension: Secondary | ICD-10-CM | POA: Diagnosis not present

## 2019-11-03 DIAGNOSIS — R7303 Prediabetes: Secondary | ICD-10-CM | POA: Diagnosis not present

## 2019-11-03 LAB — CBC WITH DIFFERENTIAL/PLATELET
Basophils Absolute: 0 10*3/uL (ref 0.0–0.1)
Basophils Relative: 0.4 % (ref 0.0–3.0)
Eosinophils Absolute: 0.1 10*3/uL (ref 0.0–0.7)
Eosinophils Relative: 1 % (ref 0.0–5.0)
HCT: 38.5 % (ref 36.0–46.0)
Hemoglobin: 12.8 g/dL (ref 12.0–15.0)
Lymphocytes Relative: 24.1 % (ref 12.0–46.0)
Lymphs Abs: 1.9 10*3/uL (ref 0.7–4.0)
MCHC: 33.1 g/dL (ref 30.0–36.0)
MCV: 84.6 fl (ref 78.0–100.0)
Monocytes Absolute: 0.9 10*3/uL (ref 0.1–1.0)
Monocytes Relative: 10.7 % (ref 3.0–12.0)
Neutro Abs: 5.1 10*3/uL (ref 1.4–7.7)
Neutrophils Relative %: 63.8 % (ref 43.0–77.0)
Platelets: 186 10*3/uL (ref 150.0–400.0)
RBC: 4.56 Mil/uL (ref 3.87–5.11)
RDW: 16.1 % — ABNORMAL HIGH (ref 11.5–15.5)
WBC: 8 10*3/uL (ref 4.0–10.5)

## 2019-11-03 LAB — BASIC METABOLIC PANEL
BUN: 16 mg/dL (ref 6–23)
CO2: 30 mEq/L (ref 19–32)
Calcium: 9.5 mg/dL (ref 8.4–10.5)
Chloride: 103 mEq/L (ref 96–112)
Creatinine, Ser: 0.95 mg/dL (ref 0.40–1.20)
GFR: 55.57 mL/min — ABNORMAL LOW (ref >60.00)
Glucose, Bld: 101 mg/dL — ABNORMAL HIGH (ref 70–99)
Potassium: 4.2 mEq/L (ref 3.5–5.1)
Sodium: 139 mEq/L (ref 135–145)

## 2019-11-03 NOTE — Assessment & Plan Note (Signed)

## 2019-11-03 NOTE — Assessment & Plan Note (Addendum)
Multiple lacunar infarcts By [facility] MRI ,  Remote/  April 20121.  Advised to continue plavix

## 2019-11-03 NOTE — Assessment & Plan Note (Signed)
2nd opinion from [facility]<   No treatment available.    No hearing aides

## 2019-11-03 NOTE — Assessment & Plan Note (Signed)
She has limited ROM despite surgery and can no longer swim laps

## 2019-11-03 NOTE — Assessment & Plan Note (Signed)
Moderate mitral regurgitation and mild aortic stenosis by 2018 [facility] with normal EF

## 2019-11-03 NOTE — Assessment & Plan Note (Signed)
cerebellar infarct, remote,  Noted on MRI during recent admission . Lacunar infarcts also noted on [facility] MRI.   discussed with patient . Continue Plavix.

## 2019-11-03 NOTE — Assessment & Plan Note (Signed)
Recommending goal of 140/80 or less given age and past intolerances

## 2019-11-03 NOTE — Patient Instructions (Signed)
Continue plavix,  Because your MRI from [facility] shoswed that you have had some strokes in the past   Continue to monitor BP weekly and add 5 mg amlodipine for BP > 150    Health Maintenance After Age 84 After age 26, you are at a higher risk for certain long-term diseases and infections as well as injuries from falls. Falls are a major cause of broken bones and head injuries in people who are older than age 7. Getting regular preventive care can help to keep you healthy and well. Preventive care includes getting regular testing and making lifestyle changes as recommended by your health care provider. Talk with your health care provider about:  Which screenings and tests you should have. A screening is a test that checks for a disease when you have no symptoms.  A diet and exercise plan that is right for you. What should I know about screenings and tests to prevent falls? Screening and testing are the best ways to find a health problem early. Early diagnosis and treatment give you the best chance of managing medical conditions that are common after age 83. Certain conditions and lifestyle choices may make you more likely to have a fall. Your health care provider may recommend:  Regular vision checks. Poor vision and conditions such as cataracts can make you more likely to have a fall. If you wear glasses, make sure to get your prescription updated if your vision changes.  Medicine review. Work with your health care provider to regularly review all of the medicines you are taking, including over-the-counter medicines. Ask your health care provider about any side effects that may make you more likely to have a fall. Tell your health care provider if any medicines that you take make you feel dizzy or sleepy.  Osteoporosis screening. Osteoporosis is a condition that causes the bones to get weaker. This can make the bones weak and cause them to break more easily.  Blood pressure screening. Blood pressure  changes and medicines to control blood pressure can make you feel dizzy.  Strength and balance checks. Your health care provider may recommend certain tests to check your strength and balance while standing, walking, or changing positions.  Foot health exam. Foot pain and numbness, as well as not wearing proper footwear, can make you more likely to have a fall.  Depression screening. You may be more likely to have a fall if you have a fear of falling, feel emotionally low, or feel unable to do activities that you used to do.  Alcohol use screening. Using too much alcohol can affect your balance and may make you more likely to have a fall. What actions can I take to lower my risk of falls? General instructions  Talk with your health care provider about your risks for falling. Tell your health care provider if: ? You fall. Be sure to tell your health care provider about all falls, even ones that seem minor. ? You feel dizzy, sleepy, or off-balance.  Take over-the-counter and prescription medicines only as told by your health care provider. These include any supplements.  Eat a healthy diet and maintain a healthy weight. A healthy diet includes low-fat dairy products, low-fat (lean) meats, and fiber from whole grains, beans, and lots of fruits and vegetables. Home safety  Remove any tripping hazards, such as rugs, cords, and clutter.  Install safety equipment such as grab bars in bathrooms and safety rails on stairs.  Keep rooms and walkways well-lit. Activity  Follow a regular exercise program to stay fit. This will help you maintain your balance. Ask your health care provider what types of exercise are appropriate for you.  If you need a cane or walker, use it as recommended by your health care provider.  Wear supportive shoes that have nonskid soles. Lifestyle  Do not drink alcohol if your health care provider tells you not to drink.  If you drink alcohol, limit how much you  have: ? 0-1 drink a day for women. ? 0-2 drinks a day for men.  Be aware of how much alcohol is in your drink. In the [place] one drink equals one typical bottle of beer (12 oz), one-half glass of wine (5 oz), or one shot of hard liquor (1 oz).  Do not use any products that contain nicotine or tobacco, such as cigarettes and e-cigarettes. If you need help quitting, ask your health care provider. Summary  Having a healthy lifestyle and getting preventive care can help to protect your health and wellness after age 23.  Screening and testing are the best way to find a health problem early and help you avoid having a fall. Early diagnosis and treatment give you the best chance for managing medical conditions that are more common for people who are older than age 7.  Falls are a major cause of broken bones and head injuries in people who are older than age 51. Take precautions to prevent a fall at home.  Work with your health care provider to learn what changes you can make to improve your health and wellness and to prevent falls. This information is not intended to replace advice given to you by your health care provider. Make sure you discuss any questions you have with your health care provider. Document Revised: 11/06/2018 Document Reviewed: 05/29/2017 Elsevier Patient Education  2020 [employer].

## 2019-11-03 NOTE — Progress Notes (Signed)
Patient ID: Kathryn Wise, female    DOB: 10/18/1929  Age: 86 y.o. MRN: [id number]  The patient is here for annual follow up  and management of other chronic and acute problems.  This visit occurred during the SARS-CoV-2 public health emergency.  Safety protocols were in place, including screening questions prior to the visit, additional usage of staff PPE, and extensive cleaning of exam room while observing appropriate contact time as indicated for disinfecting solutions.     The risk factors are reflected in the social history.  The roster of all physicians providing medical care to patient - is listed in the Snapshot section of the chart.  Activities of daily living:  The patient is 100% independent in all ADLs: dressing, toileting, feeding as well as independent mobility  Home safety : The patient has smoke detectors in the home. They wear seatbelts.  There are no firearms at home. There is no violence in the home.   There is no risks for hepatitis, STDs or HIV. There is no   history of blood transfusion. They have no travel history to infectious disease endemic areas of the world.  The patient has seen their dentist in the last six month. They have seen their eye doctor in the last year. They admit to slight hearing difficulty with regard to whispered voices and some television programs.  They have deferred audiologic testing in the last year.  They do not  have excessive sun exposure. Discussed the need for sun protection: hats, long sleeves and use of sunscreen if there is significant sun exposure.   Diet: the importance of a healthy diet is discussed. They do have a healthy diet.  The benefits of regular aerobic exercise were discussed. She walks 4 times per week ,  20 minutes.   Depression screen: there are no signs or vegative symptoms of depression- irritability, change in appetite, anhedonia, sadness/tearfullness.  Cognitive assessment: the patient manages all their financial and  personal affairs and is actively engaged. They could relate day,date,year and events; recalled 2/3 objects at 3 minutes; performed clock-face test normally.  The following portions of the patient's history were reviewed and updated as appropriate: allergies, current medications, past family history, past medical history,  past surgical history, past social history  and problem list.  Visual acuity was not assessed per patient preference since she has regular follow up with her ophthalmologist. Hearing and body mass index were assessed and reviewed.   During the course of the visit the patient was educated and counseled about appropriate screening and preventive services including : fall prevention , diabetes screening, nutrition counseling, colorectal cancer screening, and recommended immunizations.    CC: The primary encounter diagnosis was Hypothyroidism due to acquired atrophy of thyroid. Diagnoses of Hearing loss due to cerumen impaction, left, Multiple lacunar infarcts ([place]), Prediabetes, B12 deficiency, Encounter for current long-term use of anticoagulants, Primary osteoarthritis of left shoulder, Heart murmur, Encounter for preventive health examination, Renovascular hypertension, and History of CVA (cerebrovascular accident) without residual deficits were also pertinent to this visit.   HTN:  Using 5 mg amlodipine daily  Not 10,   ,  Reading 140/80 or less.  Has occasional swelling in left leg brought on by eating certain foods.  Recently had DVT ruled out  Thinks it's the plavix. Does not use salt at home,  But eats out a lot at [place],  [employer]   Mitral regurgation moderate,  And mild aortic stenosis by 2018,  Asymptomatic  Has been exercising regularly since December, [facility].  can't swim since the left shoulder was replaced  Declines mammograms  Feels great.  Still bothered by loss of hearing in left ear.  [facility] second opinion  reviewed  History Leah has a past medical history of Arthritis, Dysphagia, pharyngoesophageal phase (09/10/2013), GERD (gastroesophageal reflux disease), Heart murmur, History of shingles (Dec 2013), Hypertension, Hypothyroidism, Major depressive disorder, single episode (11/05/2015), Osteoarthritis of left shoulder (07/14/2012), and Polymyalgia rheumatica ([place]).   She has a past surgical history that includes Foot surgery; Arthroscopic rotator cuff repair; Cataract extraction w/ intraocular lens implant; Skin cancer excision; Facelift; Total shoulder arthroplasty (07/14/2012); Joint replacement (Left, Dec 2013); Cataract extraction w/PHACO (Left, 03/28/2016); LEFT HEART CATH AND CORONARY ANGIOGRAPHY (N/A, 06/12/2017); RENAL ANGIOGRAPHY (Left, 01/07/2019); and Renal artery stent.   Her family history includes Breast cancer (age of onset: 12) in her daughter; Cancer in her daughter and son.She reports that she quit smoking about 47 years ago. Her smoking use included cigarettes. She has never used smokeless tobacco. She reports current alcohol use. She reports that she does not use drugs.  Outpatient Medications Prior to Visit  Medication Sig Dispense Refill  . amLODipine (NORVASC) 5 MG tablet TAKE 2 TABLETS BY MOUTH ONCE DAILY 180 tablet 1  . Cholecalciferol (VITAMIN D-3) 1000 units CAPS Take 1 capsule by mouth daily.    . clopidogrel (PLAVIX) 75 MG tablet TAKE 1 TABLET BY MOUTH ONCE DAILY 30 tablet 4  . Cyanocobalamin (VITAMIN B 12) 100 MCG LOZG Take 1 tablet by mouth daily.     . furosemide (LASIX) 20 MG tablet Take 1 tablet (20 mg total) by mouth daily. 90 tablet 1  . levothyroxine (SYNTHROID) 88 MCG tablet TAKE 1 TABLET BY MOUTH ONCE EVERY MORNING 90 tablet 1  . nitroGLYCERIN (NITROSTAT) 0.4 MG SL tablet Place 1 tablet (0.4 mg total) every 5 (five) minutes as needed under the tongue for chest pain. 30 tablet 2  . omeprazole (PRILOSEC) 20 MG capsule TAKE 1 CAPSULE BY MOUTH ONCE EVERY MORNING 90  capsule 2  . Potassium 99 MG TABS Take 99 mg by mouth daily.     . RESTASIS 0.05 % ophthalmic emulsion Place 1 drop into both eyes 2 (two) times daily.     [name] PRESERVATIVE FREE 0.4-0.3 % SOLN     . temazepam (RESTORIL) 15 MG capsule Take 1 capsule (15 mg total) by mouth at bedtime as needed for sleep. 30 capsule 2  . TURMERIC PO Take by mouth.     No facility-administered medications prior to visit.    Review of Systems  Patient denies headache, fevers, malaise, unintentional weight loss, skin rash, eye pain, sinus congestion and sinus pain, sore throat, dysphagia,  hemoptysis , cough, dyspnea, wheezing, chest pain, palpitations, orthopnea, edema, abdominal pain, nausea, melena, diarrhea, constipation, flank pain, dysuria, hematuria, urinary  Frequency, nocturia, numbness, tingling, seizures,  Focal weakness, Loss of consciousness,  Tremor, insomnia, depression, anxiety, and suicidal ideation.     Objective:  BP (!) 150/68 (BP Location: Left Arm, Patient Position: Sitting, Cuff Size: Normal)   Pulse 82   Temp (!) 96.6 F (35.9 C) (Temporal)   Resp 16   Ht 5\' 6"  (1.676 m)   Wt 153 lb 12.8 oz (69.8 kg)   SpO2 98%   BMI 24.82 kg/m   Physical Exam  General appearance: alert, cooperative and appears stated age Ears: normal TM's and external ear  canals both ears Throat: lips, mucosa, and tongue normal; teeth and gums normal Neck: no adenopathy, no carotid bruit, supple, symmetrical, trachea midline and thyroid not enlarged, symmetric, no tenderness/mass/nodules Back: symmetric, no curvature. ROM normal. No CVA tenderness. Lungs: clear to auscultation bilaterally Heart: regular rate and rhythm, S1, S2 normal, no murmur, click, rub or gallop Abdomen: soft, non-tender; bowel sounds normal; no masses,  no organomegaly Pulses: 2+ and symmetric Skin: Skin color, texture, turgor normal. No rashes or lesions Lymph nodes: Cervical, supraclavicular, and axillary nodes  normal.  Assessment & Plan:   Problem List Items Addressed This Visit      Unprioritized   Osteoarthritis of left shoulder    She has limited ROM despite surgery and can no longer swim laps      Heart murmur    Moderate mitral regurgitation and mild aortic stenosis by 2018 ECO with normal EF      Encounter for preventive health examination    age appropriate education and counseling updated, referrals for preventative services and immunizations addressed, dietary and smoking counseling addressed, most recent labs reviewed.  I have personally reviewed and have noted:  1) the patient's medical and social history 2) The pt's use of alcohol, tobacco, and illicit drugs 3) The patient's current medications and supplements 4) Functional ability including ADL's, fall risk, home safety risk, hearing and visual impairment 5) Diet and physical activities 6) Evidence for depression or mood disorder 7) The patient's height, weight, and BMI have been recorded in the chart  I have made referrals, and provided counseling and education based on review of the above      Hypertension    Recommending goal of 140/80 or less given age and past intolerances       Hypothyroidism - Primary   Relevant Orders   TSH   History of CVA (cerebrovascular accident) without residual deficits    cerebellar infarct, remote,  Noted on MRI during recent admission . Lacunar infarcts also noted on [facility] MRI.   discussed with patient . Continue Plavix.       Hearing loss due to cerumen impaction, left    2nd opinion from [facility]<   No treatment available.    No hearing aides       RESOLVED: Multiple lacunar infarcts [facility])    Multiple lacunar infarcts By [facility] ,  Remote/  April 20121.  Advised to continue plavix       Other Visit Diagnoses    Prediabetes       Relevant Orders   Basic metabolic panel   Hemoglobin A1c   B12 deficiency       Relevant Orders   Vitamin B12   Encounter for current long-term  use of anticoagulants       Relevant Orders   CBC with Differential/Platelet      I am having Maribelle B. Pingree maintain her Restasis, Vitamin B 12, Vitamin D-3, Potassium, nitroGLYCERIN, furosemide, amLODipine, temazepam, levothyroxine, TURMERIC PO, clopidogrel, omeprazole, and Systane Preservative Free.  No orders of the defined types were placed in this encounter.   There are no discontinued medications.  Follow-up: No follow-ups on file.   [name], MD

## 2019-11-04 LAB — VITAMIN B12: Vitamin B-12: 832 pg/mL (ref 211–911)

## 2019-11-04 LAB — TSH: TSH: 1.01 u[IU]/mL (ref 0.35–4.50)

## 2019-11-04 LAB — HEMOGLOBIN A1C: Hgb A1c MFr Bld: 5.9 % (ref 4.6–6.5)

## 2019-11-10 ENCOUNTER — Other Ambulatory Visit: Payer: Self-pay

## 2019-11-10 ENCOUNTER — Other Ambulatory Visit: Payer: Self-pay | Admitting: Internal Medicine

## 2019-11-10 NOTE — Telephone Encounter (Signed)
Refill request for restoril, last seen 11-03-19, last filled 06-29-19.  Please advise.

## 2019-11-11 MED ORDER — TEMAZEPAM 15 MG PO CAPS: 15.0000 mg | ORAL_CAPSULE | Freq: Every evening | ORAL | 4 refills | Status: DC | PRN

## 2019-11-11 NOTE — Addendum Note (Signed)
Addended by: [name] on: 11/11/2019 12:42 PM   Modules accepted: Orders

## 2019-11-13 DIAGNOSIS — Z23 Encounter for immunization: Secondary | ICD-10-CM | POA: Diagnosis not present

## 2020-01-21 ENCOUNTER — Other Ambulatory Visit: Payer: Self-pay | Admitting: Internal Medicine

## 2020-01-25 ENCOUNTER — Encounter (INDEPENDENT_AMBULATORY_CARE_PROVIDER_SITE_OTHER): Payer: Self-pay | Admitting: Vascular Surgery

## 2020-01-25 ENCOUNTER — Other Ambulatory Visit: Payer: Self-pay

## 2020-01-25 ENCOUNTER — Ambulatory Visit (INDEPENDENT_AMBULATORY_CARE_PROVIDER_SITE_OTHER): Payer: Medicare Other | Admitting: Vascular Surgery

## 2020-01-25 ENCOUNTER — Ambulatory Visit (INDEPENDENT_AMBULATORY_CARE_PROVIDER_SITE_OTHER): Payer: Medicare Other

## 2020-01-25 VITALS — BP 134/72 | HR 70 | Resp 16 | Wt 152.6 lb

## 2020-01-25 DIAGNOSIS — I2510 Atherosclerotic heart disease of native coronary artery without angina pectoris: Secondary | ICD-10-CM

## 2020-01-25 DIAGNOSIS — I701 Atherosclerosis of renal artery: Secondary | ICD-10-CM

## 2020-01-25 DIAGNOSIS — I150 Renovascular hypertension: Secondary | ICD-10-CM

## 2020-01-25 DIAGNOSIS — I739 Peripheral vascular disease, unspecified: Secondary | ICD-10-CM | POA: Diagnosis not present

## 2020-02-01 ENCOUNTER — Encounter (INDEPENDENT_AMBULATORY_CARE_PROVIDER_SITE_OTHER): Payer: Self-pay | Admitting: Vascular Surgery

## 2020-02-01 NOTE — Progress Notes (Signed)
MRN : [id number]  Kathryn Wise is a 86 y.o. (1930/05/24) female who presents with chief complaint of  Chief Complaint  Patient presents with  . Follow-up    ultrasound follow up  .  History of Present Illness:   The patient returns to the office for followup and review of the noninvasive studies regarding renal vascular hypertension and renal artery stenosis. There have been no interval changes in the patient's blood pressure control.  He denies any major changes in is medications.  The patient denies headache or flushing.  No flank or unusual back pain.    There have been no significant changes to the patient's overall health care.  No interval shortening of the patient's walking distance or new symptoms consistent with claudication.  The patient denies the  development of rest pain symptoms. No new ulcers or wounds have occurred since the last visit.  The patient denies amaurosis fugax or recent TIA symptoms. There are no recent neurological changes noted. The patient denies history of DVT, PE or superficial thrombophlebitis. The patient denies recent episodes of angina or shortness of breath.   Duplex ultrasound of the renal arteries shows the right renal stent is widely patent   Current Meds  Medication Sig  . amLODipine (NORVASC) 5 MG tablet TAKE 2 TABLETS BY MOUTH ONCE DAILY  . Cholecalciferol (VITAMIN D-3) 1000 units CAPS Take 1 capsule by mouth daily.  . clopidogrel (PLAVIX) 75 MG tablet TAKE 1 TABLET BY MOUTH ONCE DAILY  . Cyanocobalamin (VITAMIN B 12) 100 MCG LOZG Take 1 tablet by mouth daily.   [name] levothyroxine (SYNTHROID) 88 MCG tablet TAKE 1 TABLET BY MOUTH ONCE DAILY ON AN EMPTY STOMACH. WAIT 30 MINUTES BEFORE TAKING OTHER MEDS.  . nitroGLYCERIN (NITROSTAT) 0.4 MG SL tablet Place 1 tablet (0.4 mg total) every 5 (five) minutes as needed under the tongue for chest pain.  [name] omeprazole (PRILOSEC) 20 MG capsule TAKE 1 CAPSULE BY MOUTH ONCE EVERY MORNING  . Potassium 99  MG TABS Take 99 mg by mouth daily.   . RESTASIS 0.05 % ophthalmic emulsion Place 1 drop into both eyes 2 (two) times daily.   [name] PRESERVATIVE FREE 0.4-0.3 % SOLN   . temazepam (RESTORIL) 15 MG capsule TAKE 1 CAPSULE BY MOUTH AT BEDTIME AS NEEDED SLEEP  . temazepam (RESTORIL) 15 MG capsule Take 1 capsule (15 mg total) by mouth at bedtime as needed for sleep.  . TURMERIC PO Take by mouth.    Past Medical History:  Diagnosis Date  . Arthritis   . Dysphagia, pharyngoesophageal phase 09/10/2013   Upper GI study with barium swallow was done at  [facility] Mar 2015   No reflux seen  Small irreducible hiatal hernia  Mild changes of presbyeophagus (abnormal contractions of the esophagus that occur with aging) No strictures Normal gastric emptying  Incomplete visualization of stomach fold due to patient's inability turn    . GERD (gastroesophageal reflux disease)   . Heart murmur    has had years and years  . History of shingles Dec 2013   treated with steroids , post op from shoulder surgery  . Hypertension   . Hypothyroidism   . Major depressive disorder, single episode 11/05/2015  . Osteoarthritis of left shoulder 07/14/2012  . Polymyalgia rheumatica (HCC)     Past Surgical History:  Procedure Laterality Date  . ARTHOSCOPIC ROTAOR CUFF REPAIR     LEFT  10+  YEARS    . CATARACT EXTRACTION W/  INTRAOCULAR LENS IMPLANT     RIGHT EYE  . CATARACT EXTRACTION W/PHACO Left 03/28/2016   Procedure: CATARACT EXTRACTION PHACO AND INTRAOCULAR LENS PLACEMENT (IOC);  Surgeon: Leandrew Koyanagi, MD;  Location: [place];  Service: Ophthalmology;  Laterality: Left;  TORIC  . FACELIFT    . FOOT SURGERY     rt foot   TUMOR REMOVED  . JOINT REPLACEMENT Left Dec 2013   shoulder  . LEFT HEART CATH AND CORONARY ANGIOGRAPHY N/A 06/12/2017   Procedure: LEFT HEART CATH AND CORONARY ANGIOGRAPHY;  Surgeon: Minna Merritts, MD;  Location: [place] [facility];  Service: Cardiovascular;  Laterality: N/A;   . RENAL ANGIOGRAPHY Left 01/07/2019   Procedure: RENAL ANGIOGRAPHY;  Surgeon: Katha Cabal, MD;  Location: [place] [facility];  Service: Cardiovascular;  Laterality: Left;  . RENAL ARTERY STENT    . SKIN CANCER EXCISION    . TOTAL SHOULDER ARTHROPLASTY  07/14/2012   Procedure: TOTAL SHOULDER ARTHROPLASTY;  Surgeon: Johnny Bridge, MD;  Location: [place];  Service: Orthopedics;  Laterality: Left;    Social History Social History   Tobacco Use  . Smoking status: Former Smoker    Types: Cigarettes    Quit date: 07/30/1972    Years since quitting: 47.5  . Smokeless tobacco: Never Used  . Tobacco comment: smoked for only a few months  Vaping Use  . Vaping Use: Never used  Substance Use Topics  . Alcohol use: Yes    Comment: OCCAS WINE   . Drug use: No    Family History Family History  Problem Relation Age of Onset  . Cancer Daughter        Breast  . Breast cancer Daughter 63  . Cancer Son     Allergies  Allergen Reactions  . Alendronate Swelling    And nausea  And nausea  And nausea   . Lisinopril     Spike in BP  . Metoprolol Other (See Comments)    lethargy  . Oxycodone     Other reaction(s): Other (See Comments) Altered mental status  . Oxycontin [Oxycodone Hcl]     Altered mental status     REVIEW OF SYSTEMS (Negative unless checked)  Constitutional: [] Weight loss  [] Fever  [] Chills Cardiac: [] Chest pain   [] Chest pressure   [] Palpitations   [] Shortness of breath when laying flat   [] Shortness of breath with exertion. Vascular:  [] Pain in legs with walking   [] Pain in legs at rest  [] History of DVT   [] Phlebitis   [] Swelling in legs   [] Varicose veins   [] Non-healing ulcers Pulmonary:   [] Uses home oxygen   [] Productive cough   [] Hemoptysis   [] Wheeze  [] COPD   [] Asthma Neurologic:  [] Dizziness   [] Seizures   [] History of stroke   [] History of TIA  [] Aphasia   [] Vissual changes   [] Weakness or numbness in arm   [] Weakness or numbness in  leg Musculoskeletal:   [] Joint swelling   [] Joint pain   [] Low back pain Hematologic:  [] Easy bruising  [] Easy bleeding   [] Hypercoagulable state   [] Anemic Gastrointestinal:  [] Diarrhea   [] Vomiting  [] Gastroesophageal reflux/heartburn   [] Difficulty swallowing. Genitourinary:  [] Chronic kidney disease   [] Difficult urination  [] Frequent urination   [] Blood in urine Skin:  [] Rashes   [] Ulcers  Psychological:  [] History of anxiety   []  History of major depression.  Physical Examination  Vitals:   01/25/20 0837  BP: 134/72  Pulse: 70  Resp:  16  Weight: 152 lb 9.6 oz (69.2 kg)   Body mass index is 24.63 kg/m. Gen: WD/WN, NAD Head: [place]/AT, No temporalis wasting.  Ear/Nose/Throat: Hearing grossly intact, nares w/o erythema or drainage Eyes: PER, EOMI, sclera nonicteric.  Neck: Supple, no large masses.   Pulmonary:  Good air movement, no audible wheezing bilaterally, no use of accessory muscles.  Cardiac: RRR, no JVD Vascular:   Vessel Right Left  Radial Palpable Palpable  Gastrointestinal: Non-distended. No guarding/no peritoneal signs.  Musculoskeletal: M/S 5/5 throughout.  No deformity or atrophy.  Neurologic: CN 2-12 intact. Symmetrical.  Speech is fluent. Motor exam as listed above. Psychiatric: Judgment intact, Mood & affect appropriate for pt's clinical situation. Dermatologic: No rashes or ulcers noted.  No changes consistent with cellulitis.   CBC Lab Results  Component Value Date   WBC 8.0 11/03/2019   HGB 12.8 11/03/2019   HCT 38.5 11/03/2019   MCV 84.6 11/03/2019   PLT 186.0 11/03/2019    BMET    Component Value Date/Time   NA 139 11/03/2019 1446   NA 138 06/07/2014 0000   K 4.2 11/03/2019 1446   CL 103 11/03/2019 1446   CO2 30 11/03/2019 1446   GLUCOSE 101 (H) 11/03/2019 1446   BUN 16 11/03/2019 1446   BUN 20 06/07/2014 0000   CREATININE 0.95 11/03/2019 1446   CREATININE 1.14 (H) 12/03/2018 1417   CALCIUM 9.5 11/03/2019 1446   GFRNONAA 59 (L)  08/14/2019 1536   GFRAA >60 08/14/2019 1536   CrCl cannot be calculated (Patient's most recent lab result is older than the maximum 21 days allowed.).  COAG Lab Results  Component Value Date   INR 1.0 08/14/2019   INR 1.0 11/08/2018   INR 0.98 07/09/2012    Radiology VAS US RENAL ARTERY DUPLEX  Result Date: 01/25/2020 ABDOMINAL VISCERAL Indications: RAS Vascular Interventions: 01/07/2019 Right renal artery stent. Comparison Study: 07/27/2019 Performing Technologist: Almira Coaster RVS  Examination Guidelines: A complete evaluation includes B-mode imaging, spectral Doppler, color Doppler, and power Doppler as needed of all accessible portions of each vessel. Bilateral testing is considered an integral part of a complete examination. Limited examinations for reoccurring indications may be performed as noted.  Duplex Findings: +------------+--------+--------+------+--------+             PSV cm/sEDV cm/sPlaqueComments +------------+--------+--------+------+--------+ Aorta Prox     59      10                  +------------+--------+--------+------+--------+ Aorta Mid      78      18                  +------------+--------+--------+------+--------+ Aorta Distal   56      0                   +------------+--------+--------+------+--------+    +------------------+--------+--------+-------+ Right Renal ArteryPSV cm/sEDV cm/sComment +------------------+--------+--------+-------+ Origin               97      19           +------------------+--------+--------+-------+ Proximal            112      14           +------------------+--------+--------+-------+ Mid                 102      16           +------------------+--------+--------+-------+ Distal  109      20           +------------------+--------+--------+-------+ +-----------------+--------+--------+-------+ Left Renal ArteryPSV cm/sEDV cm/sComment  +-----------------+--------+--------+-------+ Origin              89      15           +-----------------+--------+--------+-------+ Proximal           118      16           +-----------------+--------+--------+-------+ Mid                111      18           +-----------------+--------+--------+-------+ Distal             116      11           +-----------------+--------+--------+-------+ +------------+--------+--------+--+-----------+--------+--------+---+ Right KidneyPSV cm/sEDV cm/sRILeft KidneyPSV cm/sEDV cm/sRI  +------------+--------+--------+--+-----------+--------+--------+---+ Upper Pole                    Upper Pole                     +------------+--------+--------+--+-----------+--------+--------+---+ Mid                           Mid                            +------------+--------+--------+--+-----------+--------+--------+---+ Lower Pole                    Lower Pole                     +------------+--------+--------+--+-----------+--------+--------+---+ Hilar                         Hilar                          +------------+--------+--------+--+-----------+--------+--------+---+ +------------------+-----+------------------+----+ Right Kidney           Left Kidney            +------------------+-----+------------------+----+ RAR                    RAR                    +------------------+-----+------------------+----+ RAR (manual)      1.44 RAR (manual)      1.51 +------------------+-----+------------------+----+ Cortex                 Cortex                 +------------------+-----+------------------+----+ Cortex thickness       Corex thickness        +------------------+-----+------------------+----+ Kidney length (cm)10.61Kidney length (cm)9.91 +------------------+-----+------------------+----+  Summary: Largest Aortic Diameter: 2.3 cm  Renal:  Right: Normal size right kidney. No evidence of  right renal artery        stenosis. RRV flow present. Left:  Normal size of left kidney. No evidence of left renal artery        stenosis. LRV flow present.  *See table(s) above for measurements and observations.  Diagnosing physician: [name] MD  Electronically signed by [name] MD on 01/25/2020 at 5:24:01 PM.    Final      Assessment/Plan 1. Renal artery stenosis ([place])  BP today was acceptable Given patient's atherosclerosis and PAD optimal control of the patient's hypertension is important.  The patient's BP and noninvasive studies support the previous intervention is patent. No further intervention is indicated at this time.  Therefore the patient  will continue the current medications, no changes at this time.  The primary medical service will continue aggressive antihypertensive therapy as per the AHA guidelines   - VAS US RENAL ARTERY DUPLEX; Future  2. PAD (peripheral artery disease) (HCC)  Recommend:  The patient has evidence of atherosclerosis of the lower extremities with claudication.  The patient does not voice lifestyle limiting changes at this point in time.  Noninvasive studies do not suggest clinically significant change.  No invasive studies, angiography or surgery at this time The patient should continue walking and begin a more formal exercise program.  The patient should continue antiplatelet therapy and aggressive treatment of the lipid abnormalities  No changes in the patient's medications at this time  The patient should continue wearing graduated compression socks 10-15 mmHg strength to control the mild edema.    3. Renovascular hypertension Continue antihypertensive medications as already ordered, these medications have been reviewed and there are no changes at this time.   4. Coronary artery disease involving native coronary artery of native heart without angina pectoris Continue cardiac and antihypertensive medications as already  ordered and reviewed, no changes at this time.  Continue statin as ordered and reviewed, no changes at this time  Nitrates PRN for chest pain     [name], MD  02/01/2020 11:29 AM

## 2020-02-08 ENCOUNTER — Other Ambulatory Visit (INDEPENDENT_AMBULATORY_CARE_PROVIDER_SITE_OTHER): Payer: Self-pay | Admitting: Vascular Surgery

## 2020-02-23 ENCOUNTER — Other Ambulatory Visit: Payer: Self-pay

## 2020-02-23 ENCOUNTER — Encounter: Payer: Self-pay | Admitting: Nurse Practitioner

## 2020-02-23 ENCOUNTER — Telehealth: Payer: Self-pay | Admitting: Internal Medicine

## 2020-02-23 ENCOUNTER — Ambulatory Visit
Admission: RE | Admit: 2020-02-23 | Discharge: 2020-02-23 | Disposition: A | Discharge status: Home / Self Care | Payer: Medicare Other | Source: Ambulatory Visit | Attending: Nurse Practitioner | Admitting: Nurse Practitioner

## 2020-02-23 ENCOUNTER — Ambulatory Visit (INDEPENDENT_AMBULATORY_CARE_PROVIDER_SITE_OTHER): Payer: Medicare Other | Admitting: Nurse Practitioner

## 2020-02-23 VITALS — BP 120/62 | HR 89 | Temp 98.0°F | Ht 66.0 in | Wt 151.0 lb

## 2020-02-23 DIAGNOSIS — M353 Polymyalgia rheumatica: Secondary | ICD-10-CM | POA: Diagnosis not present

## 2020-02-23 DIAGNOSIS — R29898 Other symptoms and signs involving the musculoskeletal system: Secondary | ICD-10-CM | POA: Insufficient documentation

## 2020-02-23 DIAGNOSIS — Z8673 Personal history of transient ischemic attack (TIA), and cerebral infarction without residual deficits: Secondary | ICD-10-CM | POA: Diagnosis not present

## 2020-02-23 DIAGNOSIS — R410 Disorientation, unspecified: Secondary | ICD-10-CM | POA: Insufficient documentation

## 2020-02-23 DIAGNOSIS — I672 Cerebral atherosclerosis: Secondary | ICD-10-CM

## 2020-02-23 DIAGNOSIS — I6782 Cerebral ischemia: Secondary | ICD-10-CM | POA: Diagnosis not present

## 2020-02-23 DIAGNOSIS — I150 Renovascular hypertension: Secondary | ICD-10-CM

## 2020-02-23 DIAGNOSIS — E538 Deficiency of other specified B group vitamins: Secondary | ICD-10-CM | POA: Diagnosis not present

## 2020-02-23 DIAGNOSIS — I6389 Other cerebral infarction: Secondary | ICD-10-CM | POA: Diagnosis not present

## 2020-02-23 DIAGNOSIS — I701 Atherosclerosis of renal artery: Secondary | ICD-10-CM | POA: Diagnosis not present

## 2020-02-23 DIAGNOSIS — E034 Atrophy of thyroid (acquired): Secondary | ICD-10-CM

## 2020-02-23 DIAGNOSIS — R531 Weakness: Secondary | ICD-10-CM

## 2020-02-23 NOTE — Telephone Encounter (Signed)
[facility] Radiology called [phone] with results for pt

## 2020-02-23 NOTE — Progress Notes (Signed)
Established Patient Office Visit  Subjective:  Patient ID: Kathryn Wise, female    DOB: 1930/02/14  Age: 86 y.o. MRN: [id number]  CC:  Chief Complaint  Patient presents with  . Acute Visit    needs thyroid labs checked    HPI Kathryn Wise is a 86-year female who presents for progressive weakness, confusion, left arm weakness and numbness today.  She says for the last 5 days she had felt weak, shaky and irritable.  She did go to the [facility] this morning as usual to do water aerobics.  When she got out of the pool, she felt worse and could hardly drive herself home.  She did  get home safely, and felt like her BP was high. She could not get a reading.  She took an extra dose of amlodipine.  Her neighbor checked on her, and found her confused, and off balance and her left arm was numb and weak. EMS was called, and evaluated her at the house.  Her blood pressure was 140/90-HR-78, O2 sat 98%. Per her report, she did not show any stroke symptoms.  They did advise emergency room visit which patient declined and  instead  decided to come in this afternoon.   Currently, she denies any headache , fever, confusion has improved, gait and balance has improved, and her left arm numbness weakness has resolved.  She has had no nausea or vomiting or dysuria.  She has had no seizure activity.  She does live alone.  She has a granddaughter who had a recent baby and cannot stay with her.  Her daughter lives out of town.  A neighbor, Beatrix Shipper brought her in today and checks on her regularly. She presents on Plavix, for history of renal artery stenosis. She is well-known to neurology with history of HA and left arm weakness and numbness  with MRI /MRA most recently 03/28/19.    Past Medical History:  Diagnosis Date  . Arthritis   . Dysphagia, pharyngoesophageal phase 09/10/2013   Upper GI study with barium swallow was done at  [facility] Mar 2015   No reflux seen  Small irreducible hiatal hernia  Mild changes of  presbyeophagus (abnormal contractions of the esophagus that occur with aging) No strictures Normal gastric emptying  Incomplete visualization of stomach fold due to patient's inability turn    . GERD (gastroesophageal reflux disease)   . Heart murmur    has had years and years  . History of shingles Dec 2013   treated with steroids , post op from shoulder surgery  . Hypertension   . Hypothyroidism   . Major depressive disorder, single episode 11/05/2015  . Osteoarthritis of left shoulder 07/14/2012  . Polymyalgia rheumatica (HCC)     Past Surgical History:  Procedure Laterality Date  . ARTHOSCOPIC ROTAOR CUFF REPAIR     LEFT  10+  YEARS    . CATARACT EXTRACTION W/ INTRAOCULAR LENS IMPLANT     RIGHT EYE  . CATARACT EXTRACTION W/PHACO Left 03/28/2016   Procedure: CATARACT EXTRACTION PHACO AND INTRAOCULAR LENS PLACEMENT (IOC);  Surgeon: Leandrew Koyanagi, MD;  Location: [place];  Service: Ophthalmology;  Laterality: Left;  TORIC  . FACELIFT    . FOOT SURGERY     rt foot   TUMOR REMOVED  . JOINT REPLACEMENT Left Dec 2013   shoulder  . LEFT HEART CATH AND CORONARY ANGIOGRAPHY N/A 06/12/2017   Procedure: LEFT HEART CATH AND CORONARY ANGIOGRAPHY;  Surgeon: Minna Merritts,  MD;  Location: [place] [facility];  Service: Cardiovascular;  Laterality: N/A;  . RENAL ANGIOGRAPHY Left 01/07/2019   Procedure: RENAL ANGIOGRAPHY;  Surgeon: Katha Cabal, MD;  Location: [place] [facility];  Service: Cardiovascular;  Laterality: Left;  . RENAL ARTERY STENT    . SKIN CANCER EXCISION    . TOTAL SHOULDER ARTHROPLASTY  07/14/2012   Procedure: TOTAL SHOULDER ARTHROPLASTY;  Surgeon: Johnny Bridge, MD;  Location: [place];  Service: Orthopedics;  Laterality: Left;    Family History  Problem Relation Age of Onset  . Cancer Daughter        Breast  . Breast cancer Daughter 61  . Cancer Son     Social History   Socioeconomic History  . Marital status: Widowed    Spouse name: Not  on file  . Number of children: Not on file  . Years of education: Not on file  . Highest education level: Not on file  Occupational History  . Not on file  Tobacco Use  . Smoking status: Former Smoker    Types: Cigarettes    Quit date: 07/30/1972    Years since quitting: 47.6  . Smokeless tobacco: Never Used  . Tobacco comment: smoked for only a few months  Vaping Use  . Vaping Use: Never used  Substance and Sexual Activity  . Alcohol use: Yes    Comment: OCCAS WINE   . Drug use: No  . Sexual activity: Never  Other Topics Concern  . Not on file  Social History Narrative  . Not on file   Social Determinants of Health   Financial Resource Strain:   . Difficulty of Paying Living Expenses:   Food Insecurity:   . Worried About Charity fundraiser in the Last Year:   . Arboriculturist in the Last Year:   Transportation Needs:   . Film/video editor (Medical):   [name] Lack of Transportation (Non-Medical):   Physical Activity:   . Days of Exercise per Week:   . Minutes of Exercise per Session:   Stress:   . Feeling of Stress :   Social Connections:   . Frequency of Communication with Friends and Family:   . Frequency of Social Gatherings with Friends and Family:   . Attends Religious Services:   . Active Member of Clubs or Organizations:   . Attends Archivist Meetings:   [name] Marital Status:   Intimate Partner Violence:   . Fear of Current or Ex-Partner:   . Emotionally Abused:   [name] Physically Abused:   . Sexually Abused:     Outpatient Medications Prior to Visit  Medication Sig Dispense Refill  . amLODipine (NORVASC) 5 MG tablet TAKE 2 TABLETS BY MOUTH ONCE DAILY 180 tablet 1  . Cholecalciferol (VITAMIN D-3) 1000 units CAPS Take 1 capsule by mouth daily.    . clopidogrel (PLAVIX) 75 MG tablet TAKE 1 TABLET BY MOUTH ONCE DAILY 30 tablet 4  . Cyanocobalamin (VITAMIN B 12) 100 MCG LOZG Take 1 tablet by mouth daily.     . furosemide (LASIX) 20 MG tablet Take 1  tablet (20 mg total) by mouth daily. 90 tablet 1  . levothyroxine (SYNTHROID) 88 MCG tablet TAKE 1 TABLET BY MOUTH ONCE DAILY ON AN EMPTY STOMACH. WAIT 30 MINUTES BEFORE TAKING OTHER MEDS. 90 tablet 1  . nitroGLYCERIN (NITROSTAT) 0.4 MG SL tablet Place 1 tablet (0.4 mg total) every 5 (five) minutes as needed under the tongue  for chest pain. 30 tablet 2  . omeprazole (PRILOSEC) 20 MG capsule TAKE 1 CAPSULE BY MOUTH ONCE EVERY MORNING 90 capsule 2  . Potassium 99 MG TABS Take 99 mg by mouth daily.     . RESTASIS 0.05 % ophthalmic emulsion Place 1 drop into both eyes 2 (two) times daily.     [name] PRESERVATIVE FREE 0.4-0.3 % SOLN     . temazepam (RESTORIL) 15 MG capsule TAKE 1 CAPSULE BY MOUTH AT BEDTIME AS NEEDED SLEEP 30 capsule 0  . temazepam (RESTORIL) 15 MG capsule Take 1 capsule (15 mg total) by mouth at bedtime as needed for sleep. 30 capsule 4  . TURMERIC PO Take by mouth.     No facility-administered medications prior to visit.    Allergies  Allergen Reactions  . Alendronate Swelling    And nausea  And nausea  And nausea   . Lisinopril     Spike in BP  . Metoprolol Other (See Comments)    lethargy  . Oxycodone     Other reaction(s): Other (See Comments) Altered mental status  . Oxycontin [Oxycodone Hcl]     Altered mental status    Review of Systems Pertinent positives none history of present illness otherwise negative.   Objective:    Physical Exam Vitals reviewed.  Constitutional:      Appearance: She is normal weight.  HENT:     Right Ear: Tympanic membrane normal.     Left Ear: Tympanic membrane normal.  Eyes:     Extraocular Movements: Extraocular movements intact.     Conjunctiva/sclera: Conjunctivae normal.     Pupils: Pupils are equal, round, and reactive to light.  Cardiovascular:     Rate and Rhythm: Normal rate and regular rhythm.     Pulses: Normal pulses.     Heart sounds: Normal heart sounds.  Pulmonary:     Effort: Pulmonary effort is  normal.     Breath sounds: Normal breath sounds.  Abdominal:     Palpations: Abdomen is soft.     Tenderness: There is no abdominal tenderness.  Musculoskeletal:        General: Normal range of motion.     Cervical back: Normal range of motion and neck supple.     Right lower leg: No edema.     Left lower leg: No edema.  Skin:    General: Skin is warm and dry.  Neurological:     General: No focal deficit present.     Mental Status: She is alert and oriented to person, place, and time.     Cranial Nerves: No cranial nerve deficit.     Sensory: No sensory deficit.     Motor: No weakness.     Coordination: Coordination normal.     Gait: Gait normal.     Deep Tendon Reflexes: Reflexes normal.  Psychiatric:        Mood and Affect: Mood normal.     BP (!) 120/62 (BP Location: Left Arm, Patient Position: Sitting, Cuff Size: Normal)   Pulse 89   Temp 98 F (36.7 C) (Oral)   Ht 5' 6"  (1.676 m)   Wt 151 lb (68.5 kg)   SpO2 97%   BMI 24.37 kg/m  Wt Readings from Last 3 Encounters:  02/23/20 151 lb (68.5 kg)  01/25/20 152 lb 9.6 oz (69.2 kg)  11/03/19 153 lb 12.8 oz (69.8 kg)    There are no preventive care reminders to display for this patient.  There are no preventive care reminders to display for this patient.  Lab Results  Component Value Date   TSH 1.01 11/03/2019   Lab Results  Component Value Date   WBC 8.0 11/03/2019   HGB 12.8 11/03/2019   HCT 38.5 11/03/2019   MCV 84.6 11/03/2019   PLT 186.0 11/03/2019   Lab Results  Component Value Date   NA 139 11/03/2019   K 4.2 11/03/2019   CO2 30 11/03/2019   GLUCOSE 101 (H) 11/03/2019   BUN 16 11/03/2019   CREATININE 0.95 11/03/2019   BILITOT 0.6 08/14/2019   ALKPHOS 60 08/14/2019   AST 18 08/14/2019   ALT 10 08/14/2019   PROT 7.2 08/14/2019   ALBUMIN 3.7 08/14/2019   CALCIUM 9.5 11/03/2019   ANIONGAP 13 08/14/2019   GFR 55.57 (L) 11/03/2019   Lab Results  Component Value Date   CHOL 181 10/01/2017    Lab Results  Component Value Date   HDL 43.80 10/01/2017   Lab Results  Component Value Date   LDLCALC 115 (H) 10/01/2017   Lab Results  Component Value Date   TRIG 113.0 10/01/2017   Lab Results  Component Value Date   CHOLHDL 4 10/01/2017   Lab Results  Component Value Date   HGBA1C 5.9 11/03/2019      Assessment & Plan:   Problem List Items Addressed This Visit      Cardiovascular and Mediastinum   Hypertension - Primary   Renal artery stenosis (HCC)   Relevant Orders   Comp Met (CMET)     Endocrine   Hypothyroidism   Relevant Orders   TSH     Other   Polymyalgia rheumatica (HCC)   Relevant Orders   Sedimentation rate   History of CVA (cerebrovascular accident) without residual deficits   Relevant Orders   Lipid Profile    Other Visit Diagnoses    Left arm weakness       Relevant Orders   B12 and Folate Panel   Vitamin B1   CT Head Wo Contrast (Completed)   New onset of confusion       Relevant Orders   Urinalysis, Routine w reflex microscopic   CT Head Wo Contrast (Completed)   Weakness       Relevant Orders   CBC with Differential/Platelet   Comp Met (CMET)   Urinalysis, Routine w reflex microscopic   Cerebral atherosclerosis        Relevant Orders   Lipid Profile   Deficiency of other specified B group vitamins        Relevant Orders   B12 and Folate Panel     CLINICAL DATA:  86 year old female with episodes of severe headache, pain behind the right ear. Associated left arm weakness and numbness.  EXAM: MR VENOGRAM OF THE  HEAD WITHOUT CONTRAST  TECHNIQUE: Angiographic images of the intracranial venous structures were obtained using MRV technique without intravenous contrast.  COMPARISON:  Intracranial MRA today reported separately. Brain MRI 11/08/2018.  FINDINGS: Coronal time-of-flight images reveal preserved flow signal in the superior sagittal sinus, torcula, straight sinus, vein of Galen, internal cerebral veins  and basal veins of Rosenthal. Preserved flow signal in both transverse sinuses, both sigmoid sinuses, and the left IJ bulb. There appears to be a large emissary vein communicating from the sigmoid sinus into the right suboccipital/paravertebral region (series 10, image 74) which demonstrates preserved flow signal. This is also evident on the MRI earlier this year. There is a smaller left side  emissary vein. There is bilateral paravertebral venous flow signal.  IMPRESSION: 1. Negative for intracranial venous sinus thrombosis. 2. Prominent right side occipital emissary vein from the sigmoid sinus to paravertebral veins resulting in preferential venous flow on that side into the paravertebral venous system. There is a smaller left side emissary vein. These findings are unchanged from April this year, and typically are Normal Anatomic Variations. However, in this clinical setting consider a follow-up Doppler Ultrasound Of The Neck (or alternatively routine CT Neck with IV contrast) to exclude the possibility of underlying Right Internal Jugular Vein Thrombosis.  These results will be called to the ordering clinician or representative by the Radiologist Assistant, and communication documented in the PACS or zVision Dashboard.   Electronically Signed   By: Genevie Ann M.D.   On: 03/28/2019 21:31   EXAM: MRA HEAD WITHOUT CONTRAST  TECHNIQUE: Angiographic images of the Circle of Willis were obtained using MRA technique without intravenous contrast.  COMPARISON:  Brain MRI 11/08/2018.  FINDINGS: Cerebral volume appears stable with no intracranial mass effect or ventriculomegaly.  Antegrade flow in the posterior circulation with codominant distal vertebral arteries. Patent PICA origins and no distal vertebral stenosis. Patent vertebrobasilar junction. Patent basilar artery without stenosis. Normal SCA and PCA origins. Small left posterior communicating artery, the right is  more diminutive or absent. Bilateral PCA branches are within normal limits.  Antegrade flow in both ICA siphons. The right siphon is slightly larger and may relate to a non dominant appearance of the left ACA A1 segment. Mild siphon irregularity. No siphon stenosis. Normal right ophthalmic and left posterior communicating artery origins. Patent carotid termini. Patent MCA and ACA origins. Non dominant appearing left A1. Anterior communicating artery and visible ACA branches are within normal limits. MCA M1 segments, bifurcations, and visible bilateral MCA branches are within normal limits.  No abnormal flow signal identified about the right temporal bone or elsewhere.  IMPRESSION: Normal for age intracranial MRA.  Electronically Signed   By: Genevie Ann M.D.   On: 03/28/2019 21:23  No orders of the defined types were placed in this encounter.  Patient presents with possible stroke symptoms earlier today.   I discussed this case with her neurologist, [name] who is in agreement with the plan of care.  He will see her in his office tomorrow. Pt was advised to have someone in the house with her today and during the night. Any sign of confusion, balance problems, left weakness, slurred speech, etc call 911 for ER transport.   She was sent for stat CT of the head that showed no acute intracranial abnormality.  There is stable degree of chronic small vessel ischemia since CT 1 year ago. She will remain on Plavix.   Follow-up: Return in about 1 week (around 03/01/2020).  This visit occurred during the SARS-CoV-2 public health emergency.  Safety protocols were in place, including screening questions prior to the visit, additional usage of staff PPE, and extensive cleaning of exam room while observing appropriate contact time as indicated for disinfecting solutions.    Denice Paradise, NP

## 2020-02-23 NOTE — Patient Instructions (Addendum)
Please go to the lab today for blood work.  Arrange for CT of the head to take a look at the cause for your confusion and left arm weakness and numbness today.  Follow-up office visit with [name] for neurology tomorrow.  His office will call with time.   Please have someone in the house with you today and during the night.   Any sign of confusion, balance problems, left weakness, slurred speech, etc call 911 for ER transport to r/o stroke.

## 2020-02-23 NOTE — Telephone Encounter (Signed)
Kathryn Bills, NP already reviewed results and they have been given to the patient

## 2020-02-24 DIAGNOSIS — R2990 Unspecified symptoms and signs involving the nervous system: Secondary | ICD-10-CM | POA: Diagnosis not present

## 2020-02-24 LAB — LIPID PANEL
Cholesterol: 203 mg/dL — ABNORMAL HIGH (ref 0–200)
HDL: 46.7 mg/dL (ref >39.00)
LDL Cholesterol: 132 mg/dL — ABNORMAL HIGH (ref 0–99)
NonHDL: 155.9
Total CHOL/HDL Ratio: 4
Triglycerides: 121 mg/dL (ref 0.0–149.0)
VLDL: 24.2 mg/dL (ref 0.0–40.0)

## 2020-02-24 LAB — SEDIMENTATION RATE: Sed Rate: 25 mm/hr (ref 0–30)

## 2020-02-24 LAB — CBC WITH DIFFERENTIAL/PLATELET
Basophils Absolute: 0.1 10*3/uL (ref 0.0–0.1)
Basophils Relative: 0.6 % (ref 0.0–3.0)
Eosinophils Absolute: 0 10*3/uL (ref 0.0–0.7)
Eosinophils Relative: 0.4 % (ref 0.0–5.0)
HCT: 40.1 % (ref 36.0–46.0)
Hemoglobin: 13.2 g/dL (ref 12.0–15.0)
Lymphocytes Relative: 23.4 % (ref 12.0–46.0)
Lymphs Abs: 2.1 10*3/uL (ref 0.7–4.0)
MCHC: 32.9 g/dL (ref 30.0–36.0)
MCV: 85 fl (ref 78.0–100.0)
Monocytes Absolute: 0.7 10*3/uL (ref 0.1–1.0)
Monocytes Relative: 8.5 % (ref 3.0–12.0)
Neutro Abs: 5.9 10*3/uL (ref 1.4–7.7)
Neutrophils Relative %: 67.1 % (ref 43.0–77.0)
Platelets: 188 10*3/uL (ref 150.0–400.0)
RBC: 4.71 Mil/uL (ref 3.87–5.11)
RDW: 15.9 % — ABNORMAL HIGH (ref 11.5–15.5)
WBC: 8.8 10*3/uL (ref 4.0–10.5)

## 2020-02-24 LAB — COMPREHENSIVE METABOLIC PANEL
ALT: 8 U/L (ref 0–35)
AST: 15 U/L (ref 0–37)
Albumin: 4.2 g/dL (ref 3.5–5.2)
Alkaline Phosphatase: 61 U/L (ref 39–117)
BUN: 11 mg/dL (ref 6–23)
CO2: 28 mEq/L (ref 19–32)
Calcium: 9.6 mg/dL (ref 8.4–10.5)
Chloride: 102 mEq/L (ref 96–112)
Creatinine, Ser: 0.87 mg/dL (ref 0.40–1.20)
GFR: 61.47 mL/min (ref >60.00)
Glucose, Bld: 106 mg/dL — ABNORMAL HIGH (ref 70–99)
Potassium: 4.4 mEq/L (ref 3.5–5.1)
Sodium: 136 mEq/L (ref 135–145)
Total Bilirubin: 0.8 mg/dL (ref 0.2–1.2)
Total Protein: 7.1 g/dL (ref 6.0–8.3)

## 2020-02-24 LAB — TSH: TSH: 1.44 u[IU]/mL (ref 0.35–4.50)

## 2020-02-24 LAB — URINALYSIS, ROUTINE W REFLEX MICROSCOPIC
Bilirubin Urine: NEGATIVE
Hgb urine dipstick: NEGATIVE
Ketones, ur: NEGATIVE
Leukocytes,Ua: NEGATIVE
Nitrite: NEGATIVE
RBC / HPF: NONE SEEN (ref 0–?)
Specific Gravity, Urine: <=1.005 — AB (ref 1.000–1.030)
Total Protein, Urine: NEGATIVE
Urine Glucose: NEGATIVE
Urobilinogen, UA: 0.2 (ref 0.0–1.0)
pH: 6.5 (ref 5.0–8.0)

## 2020-02-24 LAB — B12 AND FOLATE PANEL
Folate: 16.2 ng/mL (ref >5.9)
Vitamin B-12: 747 pg/mL (ref 211–911)

## 2020-02-25 ENCOUNTER — Other Ambulatory Visit: Payer: Self-pay | Admitting: Neurology

## 2020-02-25 DIAGNOSIS — R2990 Unspecified symptoms and signs involving the nervous system: Secondary | ICD-10-CM

## 2020-02-26 LAB — VITAMIN B1: Vitamin B1 (Thiamine): 12 nmol/L (ref 8–30)

## 2020-02-26 NOTE — Telephone Encounter (Signed)
Spoke with pt in regards to lab results. Pt gave a verbal understanding.

## 2020-02-26 NOTE — Telephone Encounter (Signed)
Pt returned a call to "Jessica".. Not sure which one

## 2020-02-29 ENCOUNTER — Encounter: Payer: Self-pay | Admitting: Nurse Practitioner

## 2020-02-29 ENCOUNTER — Ambulatory Visit: Payer: Medicare Other | Admitting: Nurse Practitioner

## 2020-02-29 ENCOUNTER — Telehealth: Payer: Self-pay | Admitting: Internal Medicine

## 2020-02-29 NOTE — Telephone Encounter (Signed)
The patient is weak, shaky, irritable and eyes are bulging The patient wants to see a thyroid specialist as quick as possible.

## 2020-03-01 ENCOUNTER — Other Ambulatory Visit: Payer: Self-pay | Admitting: Internal Medicine

## 2020-03-01 DIAGNOSIS — E039 Hypothyroidism, unspecified: Secondary | ICD-10-CM

## 2020-03-01 NOTE — Telephone Encounter (Signed)
Referral to [facility] Endcr[facility]ology in process as requested by patient

## 2020-03-01 NOTE — Telephone Encounter (Signed)
Can you triage please 

## 2020-03-01 NOTE — Telephone Encounter (Signed)
Patient stated she is still shaky, weak, food taste like metal, and bulging eyes.. Was seen by Dawson Bills NP. Dr Manuella Ghazi suggested referral to thyroid doctor, but unable to place referral. PCP needed to place referral. Patient only wants to see them. Please advise.

## 2020-03-01 NOTE — Telephone Encounter (Signed)
Left message for patient to return call back.  

## 2020-03-01 NOTE — Telephone Encounter (Signed)
Patient returned office phone call. 

## 2020-03-04 NOTE — Telephone Encounter (Signed)
Pt called to follow up on below message. I let her know referral was placed and gave her the number to call to schedule appointment.

## 2020-03-15 ENCOUNTER — Other Ambulatory Visit: Payer: Self-pay

## 2020-03-15 ENCOUNTER — Ambulatory Visit
Admission: RE | Admit: 2020-03-15 | Discharge: 2020-03-15 | Disposition: A | Discharge status: Home / Self Care | Payer: Medicare Other | Source: Ambulatory Visit | Attending: Neurology | Admitting: Neurology

## 2020-03-15 DIAGNOSIS — I6782 Cerebral ischemia: Secondary | ICD-10-CM | POA: Diagnosis not present

## 2020-03-15 DIAGNOSIS — R2990 Unspecified symptoms and signs involving the nervous system: Secondary | ICD-10-CM | POA: Insufficient documentation

## 2020-03-15 DIAGNOSIS — R9082 White matter disease, unspecified: Secondary | ICD-10-CM | POA: Diagnosis not present

## 2020-03-15 DIAGNOSIS — R531 Weakness: Secondary | ICD-10-CM | POA: Diagnosis not present

## 2020-03-21 DIAGNOSIS — H04123 Dry eye syndrome of bilateral lacrimal glands: Secondary | ICD-10-CM | POA: Diagnosis not present

## 2020-04-20 ENCOUNTER — Ambulatory Visit (INDEPENDENT_AMBULATORY_CARE_PROVIDER_SITE_OTHER): Payer: Medicare Other

## 2020-04-20 ENCOUNTER — Telehealth: Payer: Self-pay | Admitting: Internal Medicine

## 2020-04-20 VITALS — BP 136/64 | Ht 66.0 in | Wt 151.0 lb

## 2020-04-20 DIAGNOSIS — Z0000 Encounter for general adult medical examination without abnormal findings: Secondary | ICD-10-CM

## 2020-04-20 NOTE — Telephone Encounter (Signed)
Rejection Reason - Patient did not respond - Patient cancelled appt with Dr. Honor Junes on 04/07/2020 and has not responded to attempts to reschedule this appt." [facility] said on Apr 20, 2020 11:14 AM

## 2020-04-20 NOTE — Progress Notes (Addendum)
Subjective:   Kathryn Wise is a 86 y.o. female who presents for Medicare Annual (Subsequent) preventive examination.  Review of Systems    No ROS.  Medicare Wellness Virtual Visit.    Cardiac Risk Factors include: advanced age (>29men, >19 women);hypertension     Objective:    Today's Vitals   04/20/20 1304  BP: 136/64  Weight: 151 lb (68.5 kg)  Height: 5\' 6"  (1.676 m)   Body mass index is 24.37 kg/m.  Advanced Directives 08/14/2019 04/20/2019 02/24/2019 01/07/2019 11/08/2018 01/15/2018 06/12/2017  Does Patient Have a Medical Advance Directive? No No Yes No No Yes Yes  Type of Advance Directive - - - - - Press photographer;Living will [name];Living will  Does patient want to make changes to medical advance directive? - - - - - No - Patient declined No - Patient declined  Copy of [place] in Chart? - - - - - No - copy requested No - copy requested  Would patient like information on creating a medical advance directive? - No - Patient declined - No - Patient declined No - Patient declined - -  Pre-existing out of facility DNR order (yellow form or pink MOST form) - - - - - - -    Current Medications (verified) Outpatient Encounter Medications as of 04/20/2020  Medication Sig  . amLODipine (NORVASC) 5 MG tablet TAKE 2 TABLETS BY MOUTH ONCE DAILY  . Cholecalciferol (VITAMIN D-3) 1000 units CAPS Take 1 capsule by mouth daily.  . clopidogrel (PLAVIX) 75 MG tablet TAKE 1 TABLET BY MOUTH ONCE DAILY  . Cyanocobalamin (VITAMIN B 12) 100 MCG LOZG Take 1 tablet by mouth daily.   . furosemide (LASIX) 20 MG tablet Take 1 tablet (20 mg total) by mouth daily.  [name] levothyroxine (SYNTHROID) 88 MCG tablet TAKE 1 TABLET BY MOUTH ONCE DAILY ON AN EMPTY STOMACH. WAIT 30 MINUTES BEFORE TAKING OTHER MEDS.  . nitroGLYCERIN (NITROSTAT) 0.4 MG SL tablet Place 1 tablet (0.4 mg total) every 5 (five) minutes as needed under the tongue for chest pain.  [name]  omeprazole (PRILOSEC) 20 MG capsule TAKE 1 CAPSULE BY MOUTH ONCE EVERY MORNING  . Potassium 99 MG TABS Take 99 mg by mouth daily.   . RESTASIS 0.05 % ophthalmic emulsion Place 1 drop into both eyes 2 (two) times daily.   [name] PRESERVATIVE FREE 0.4-0.3 % SOLN   . temazepam (RESTORIL) 15 MG capsule TAKE 1 CAPSULE BY MOUTH AT BEDTIME AS NEEDED SLEEP  . temazepam (RESTORIL) 15 MG capsule Take 1 capsule (15 mg total) by mouth at bedtime as needed for sleep.  . TURMERIC PO Take by mouth.   No facility-administered encounter medications on file as of 04/20/2020.    Allergies (verified) Alendronate, Lisinopril, Metoprolol, Oxycodone, and Oxycontin [oxycodone hcl]   History: Past Medical History:  Diagnosis Date  . Arthritis   . Dysphagia, pharyngoesophageal phase 09/10/2013   Upper GI study with barium swallow was done at  [facility] Mar 2015   No reflux seen  Small irreducible hiatal hernia  Mild changes of presbyeophagus (abnormal contractions of the esophagus that occur with aging) No strictures Normal gastric emptying  Incomplete visualization of stomach fold due to patient's inability turn    . GERD (gastroesophageal reflux disease)   . Heart murmur    has had years and years  . History of shingles Dec 2013   treated with steroids , post op from shoulder surgery  .  Hypertension   . Hypothyroidism   . Major depressive disorder, single episode 11/05/2015  . Osteoarthritis of left shoulder 07/14/2012  . Polymyalgia rheumatica (HCC)    Past Surgical History:  Procedure Laterality Date  . ARTHOSCOPIC ROTAOR CUFF REPAIR     LEFT  10+  YEARS    . CATARACT EXTRACTION W/ INTRAOCULAR LENS IMPLANT     RIGHT EYE  . CATARACT EXTRACTION W/PHACO Left 03/28/2016   Procedure: CATARACT EXTRACTION PHACO AND INTRAOCULAR LENS PLACEMENT (IOC);  Surgeon: Leandrew Koyanagi, MD;  Location: [place];  Service: Ophthalmology;  Laterality: Left;  TORIC  . FACELIFT    . FOOT SURGERY     rt foot    TUMOR REMOVED  . JOINT REPLACEMENT Left Dec 2013   shoulder  . LEFT HEART CATH AND CORONARY ANGIOGRAPHY N/A 06/12/2017   Procedure: LEFT HEART CATH AND CORONARY ANGIOGRAPHY;  Surgeon: Minna Merritts, MD;  Location: [place] [facility];  Service: Cardiovascular;  Laterality: N/A;  . RENAL ANGIOGRAPHY Left 01/07/2019   Procedure: RENAL ANGIOGRAPHY;  Surgeon: Katha Cabal, MD;  Location: [place] [facility];  Service: Cardiovascular;  Laterality: Left;  . RENAL ARTERY STENT    . SKIN CANCER EXCISION    . TOTAL SHOULDER ARTHROPLASTY  07/14/2012   Procedure: TOTAL SHOULDER ARTHROPLASTY;  Surgeon: Johnny Bridge, MD;  Location: [place];  Service: Orthopedics;  Laterality: Left;   Family History  Problem Relation Age of Onset  . Cancer Daughter        Breast  . Breast cancer Daughter 51  . Cancer Son    Social History   Socioeconomic History  . Marital status: Widowed    Spouse name: Not on file  . Number of children: Not on file  . Years of education: Not on file  . Highest education level: Not on file  Occupational History  . Not on file  Tobacco Use  . Smoking status: Former Smoker    Types: Cigarettes    Quit date: 07/30/1972    Years since quitting: 47.7  . Smokeless tobacco: Never Used  . Tobacco comment: smoked for only a few months  Vaping Use  . Vaping Use: Never used  Substance and Sexual Activity  . Alcohol use: Yes    Comment: OCCAS WINE   . Drug use: No  . Sexual activity: Never  Other Topics Concern  . Not on file  Social History Narrative  . Not on file   Social Determinants of Health   Financial Resource Strain: Low Risk   . Difficulty of Paying Living Expenses: Not hard at all  Food Insecurity: No Food Insecurity  . Worried About Charity fundraiser in the Last Year: Never true  . Ran Out of Food in the Last Year: Never true  Transportation Needs: No Transportation Needs  . Lack of Transportation (Medical): No  . Lack of Transportation  (Non-Medical): No  Physical Activity: Sufficiently Active  . Days of Exercise per Week: 4 days  . Minutes of Exercise per Session: 60 min  Stress: No Stress Concern Present  . Feeling of Stress : Not at all  Social Connections: Unknown  . Frequency of Communication with Friends and Family: More than three times a week  . Frequency of Social Gatherings with Friends and Family: Not on file  . Attends Religious Services: Not on file  . Active Member of Clubs or Organizations: Not on file  . Attends Archivist Meetings: Not  on file  . Marital Status: Not on file    Tobacco Counseling Counseling given: Not Answered Comment: smoked for only a few months   Clinical Intake:  Pre-visit preparation completed: Yes        Diabetes: No  How often do you need to have someone help you when you read instructions, pamphlets, or other written materials from your doctor or pharmacy?: 1 - Never  Interpreter Needed?: No      Activities of Daily Living In your present state of health, do you have any difficulty performing the following activities: 04/20/2020  Hearing? Y  Comment L ear hearing loss. She does not wear hearing aids.  Vision? N  Difficulty concentrating or making decisions? N  Walking or climbing stairs? N  Dressing or bathing? N  Doing errands, shopping? N  Preparing Food and eating ? N  Using the Toilet? N  In the past six months, have you accidently leaked urine? N  Do you have problems with loss of bowel control? N  Managing your Medications? N  Managing your Finances? N  Housekeeping or managing your Housekeeping? N  Some recent data might be hidden    Patient Care Team: [name], MD as PCP - General (Internal Medicine)  Indicate any recent Medical Services you may have received from other than [facility] providers in the past year (date may be approximate).     Assessment:   This is a routine wellness examination for Vandiver.  I connected with  Olena today by telephone and verified that I am speaking with the correct person using two identifiers. Location patient: home Location provider: work Persons participating in the virtual visit: patient, Marine scientist.    I discussed the limitations, risks, security and privacy concerns of performing an evaluation and management service by telephone and the availability of in person appointments. The patient expressed understanding and verbally consented to this telephonic visit.    Interactive audio and video telecommunications were attempted between this provider and patient, however failed, due to patient having technical difficulties OR patient did not have access to video capability.  We continued and completed visit with audio only.  Some vital signs may be absent or patient reported.   Hearing/Vision screen  Hearing Screening   125Hz  250Hz  500Hz  1000Hz  2000Hz  3000Hz  4000Hz  6000Hz  8000Hz   Right ear:           Left ear:           Comments: Difficulty hearing L ear.  Followed by pcp and ENT.  Hearing aids- No.  Vision Screening Comments: Followed by [facility]  Wears corrective lenses  Virtual visit  Dietary issues and exercise activities discussed: Healthy diet Good water intake  Current Exercise Habits: Home exercise routine, Type of exercise: walking (aerobics), Time (Minutes): 60, Frequency (Times/Week): 4, Weekly Exercise (Minutes/Week): 240, Intensity: Mild  Goals    . Maintain Healthy Lifestyle     Healthy diet Stay hydrated Exercise Keep scheduled visits with pcp       Depression Screen PHQ 2/9 Scores 04/20/2020 04/20/2019 01/15/2018 11/01/2016 10/08/2014 09/10/2013  PHQ - 2 Score 0 0 0 0 0 0    Fall Risk Fall Risk  04/20/2020 11/03/2019 04/20/2019 04/20/2019 01/15/2018  Falls in the past year? 0 0 0 0 Yes  Comment - - - - -  Number falls in past yr: 0 - - - 1  Injury with Fall? - - - - (No Data)  Comment - - - - Followed  by pcp  Follow up Falls evaluation  completed Falls evaluation completed Falls evaluation completed - Falls prevention discussed   Handrails in use when climbing stairs? Yes Home free of loose throw rugs in walkways, pet beds, electrical cords, etc? Yes  Adequate lighting in your home to reduce risk of falls? Yes   ASSISTIVE DEVICES UTILIZED TO PREVENT FALLS:  Life alert? No . Cell phone when outside of the home? Yes Use of a cane, walker or w/c? No  Grab bars in the bathroom? Yes  Shower chair or bench in shower? Yes  Elevated toilet seat or a handicapped toilet? No   TIMED UP AND GO: Was the test performed? No . Virtual visit.   Cognitive Function: MMSE - Mini Mental State Exam 01/15/2018  Orientation to time 5  Orientation to Place 5  Registration 3  Attention/ Calculation 5  Recall 3  Language- name 2 objects 2  Language- repeat 1  Language- follow 3 step command 3  Language- read & follow direction 1  Write a sentence 1  Copy design 1  Total score 30     6CIT Screen 04/20/2019 11/01/2016  What Year? 0 points 0 points  What month? 0 points 0 points  What time? 0 points 0 points  Count back from 20 0 points 0 points  Months in reverse 0 points 0 points  Repeat phrase 0 points 0 points  Total Score 0 0    Immunizations Immunization History  Administered Date(s) Administered  . Fluad Quad(high Dose 65+) 04/20/2019  . Influenza Split 07/17/2012, 05/22/2013  . Influenza, High Dose Seasonal PF 04/28/2015, 05/30/2016, 07/03/2017, 04/07/2018  . PFIZER SARS-COV-2 Vaccination 10/16/2019, 11/13/2019  . Pneumococcal Conjugate-13 09/10/2013  . Pneumococcal Polysaccharide-23 09/10/2008, 10/08/2014  . Tdap 10/07/2013   Health Maintenance Health Maintenance  Topic Date Due  . INFLUENZA VACCINE  02/28/2020  . TETANUS/TDAP  10/08/2023  . DEXA SCAN  Completed  . COVID-19 Vaccine  Completed  . PNA vac Low Risk Adult  Completed    Dental Screening: Recommended annual dental exams for proper oral hygiene.  Visits every 6 months.   Community Resource Referral / Chronic Care Management: CRR required this visit?  No   CCM required this visit?  No      Plan:   Keep all routine maintenance appointments.   Follow up 05/04/20 @ 2:00  I have personally reviewed and noted the following in the patient's chart:   . Medical and social history . Use of alcohol, tobacco or illicit drugs  . Current medications and supplements . Functional ability and status . Nutritional status . Physical activity . Advanced directives . List of other physicians . Hospitalizations, surgeries, and ER visits in previous 12 months . Vitals . Screenings to include cognitive, depression, and falls . Referrals and appointments  In addition, I have reviewed and discussed with patient certain preventive protocols, quality metrics, and best practice recommendations. A written personalized care plan for preventive services as well as general preventive health recommendations were provided to patient via mychart.     OBrien-Blaney, Blythe Hartshorn L, LPN   [id number]      I have reviewed the above information and agree with above.   Deborra Medina, MD

## 2020-04-20 NOTE — Patient Instructions (Addendum)
Kathryn Wise , Thank you for taking time to come for your Medicare Wellness Visit. I appreciate your ongoing commitment to your health goals. Please review the following plan we discussed and let me know if I can assist you in the future.   These are the goals we discussed: Goals    . Maintain Healthy Lifestyle     Healthy diet Stay hydrated Exercise Keep scheduled visits with pcp        This is a list of the screening recommended for you and due dates:  Health Maintenance  Topic Date Due  . Flu Shot  02/28/2020  . Tetanus Vaccine  10/08/2023  . DEXA scan (bone density measurement)  Completed  . COVID-19 Vaccine  Completed  . Pneumonia vaccines  Completed    Immunizations Immunization History  Administered Date(s) Administered  . Fluad Quad(high Dose 65+) 04/20/2019  . Influenza Split 07/17/2012, 05/22/2013  . Influenza, High Dose Seasonal PF 04/28/2015, 05/30/2016, 07/03/2017, 04/07/2018  . PFIZER SARS-COV-2 Vaccination 10/16/2019, 11/13/2019  . Pneumococcal Conjugate-13 09/10/2013  . Pneumococcal Polysaccharide-23 09/10/2008, 10/08/2014  . Tdap 10/07/2013   Keep all routine maintenance appointments.   Follow up 05/04/20 @ 2:00  Advanced directives: End of life planning; Advance aging; Advanced directives discussed.  Copy of current HCPOA/Living Will requested.    Conditions/risks identified: none new.   Follow up in one year for your annual wellness visit.   Preventive Care 42 Years and Older, Female Preventive care refers to lifestyle choices and visits with your health care provider that can promote health and wellness. What does preventive care include?  A yearly physical exam. This is also called an annual well check.  Dental exams once or twice a year.  Routine eye exams. Ask your health care provider how often you should have your eyes checked.  Personal lifestyle choices, including:  Daily care of your teeth and gums.  Regular physical  activity.  Eating a healthy diet.  Avoiding tobacco and drug use.  Limiting alcohol use.  Practicing safe sex.  Taking low-dose aspirin every day.  Taking vitamin and mineral supplements as recommended by your health care provider. What happens during an annual well check? The services and screenings done by your health care provider during your annual well check will depend on your age, overall health, lifestyle risk factors, and family history of disease. Counseling  Your health care provider may ask you questions about your:  Alcohol use.  Tobacco use.  Drug use.  Emotional well-being.  Home and relationship well-being.  Sexual activity.  Eating habits.  History of falls.  Memory and ability to understand (cognition).  Work and work Statistician.  Reproductive health. Screening  You may have the following tests or measurements:  Height, weight, and BMI.  Blood pressure.  Lipid and cholesterol levels. These may be checked every 5 years, or more frequently if you are over 38 years old.  Skin check.  Lung cancer screening. You may have this screening every year starting at age 14 if you have a 30-pack-year history of smoking and currently smoke or have quit within the past 15 years.  Fecal occult blood test (FOBT) of the stool. You may have this test every year starting at age 21.  Flexible sigmoidoscopy or colonoscopy. You may have a sigmoidoscopy every 5 years or a colonoscopy every 10 years starting at age 11.  Hepatitis C blood test.  Hepatitis B blood test.  Sexually transmitted disease (STD) testing.  Diabetes screening. This is  done by checking your blood sugar (glucose) after you have not eaten for a while (fasting). You may have this done every 1-3 years.  Bone density scan. This is done to screen for osteoporosis. You may have this done starting at age 25.  Mammogram. This may be done every 1-2 years. Talk to your health care provider about  how often you should have regular mammograms. Talk with your health care provider about your test results, treatment options, and if necessary, the need for more tests. Vaccines  Your health care provider may recommend certain vaccines, such as:  Influenza vaccine. This is recommended every year.  Tetanus, diphtheria, and acellular pertussis (Tdap, Td) vaccine. You may need a Td booster every 10 years.  Zoster vaccine. You may need this after age 29.  Pneumococcal 13-valent conjugate (PCV13) vaccine. One dose is recommended after age 40.  Pneumococcal polysaccharide (PPSV23) vaccine. One dose is recommended after age 28. Talk to your health care provider about which screenings and vaccines you need and how often you need them. This information is not intended to replace advice given to you by your health care provider. Make sure you discuss any questions you have with your health care provider. Document Released: 08/12/2015 Document Revised: 04/04/2016 Document Reviewed: 05/17/2015 Elsevier Interactive Patient Education  2017 [place] Prevention in the Home Falls can cause injuries. They can happen to people of all ages. There are many things you can do to make your home safe and to help prevent falls. What can I do on the outside of my home?  Regularly fix the edges of walkways and driveways and fix any cracks.  Remove anything that might make you trip as you walk through a door, such as a raised step or threshold.  Trim any bushes or trees on the path to your home.  Use bright outdoor lighting.  Clear any walking paths of anything that might make someone trip, such as rocks or tools.  Regularly check to see if handrails are loose or broken. Make sure that both sides of any steps have handrails.  Any raised decks and porches should have guardrails on the edges.  Have any leaves, snow, or ice cleared regularly.  Use sand or salt on walking paths during  winter.  Clean up any spills in your garage right away. This includes oil or grease spills. What can I do in the bathroom?  Use night lights.  Install grab bars by the toilet and in the tub and shower. Do not use towel bars as grab bars.  Use non-skid mats or decals in the tub or shower.  If you need to sit down in the shower, use a plastic, non-slip stool.  Keep the floor dry. Clean up any water that spills on the floor as soon as it happens.  Remove soap buildup in the tub or shower regularly.  Attach bath mats securely with double-sided non-slip rug tape.  Do not have throw rugs and other things on the floor that can make you trip. What can I do in the bedroom?  Use night lights.  Make sure that you have a light by your bed that is easy to reach.  Do not use any sheets or blankets that are too big for your bed. They should not hang down onto the floor.  Have a firm chair that has side arms. You can use this for support while you get dressed.  Do not have throw rugs and other things  on the floor that can make you trip. What can I do in the kitchen?  Clean up any spills right away.  Avoid walking on wet floors.  Keep items that you use a lot in easy-to-reach places.  If you need to reach something above you, use a strong step stool that has a grab bar.  Keep electrical cords out of the way.  Do not use floor polish or wax that makes floors slippery. If you must use wax, use non-skid floor wax.  Do not have throw rugs and other things on the floor that can make you trip. What can I do with my stairs?  Do not leave any items on the stairs.  Make sure that there are handrails on both sides of the stairs and use them. Fix handrails that are broken or loose. Make sure that handrails are as long as the stairways.  Check any carpeting to make sure that it is firmly attached to the stairs. Fix any carpet that is loose or worn.  Avoid having throw rugs at the top or  bottom of the stairs. If you do have throw rugs, attach them to the floor with carpet tape.  Make sure that you have a light switch at the top of the stairs and the bottom of the stairs. If you do not have them, ask someone to add them for you. What else can I do to help prevent falls?  Wear shoes that:  Do not have high heels.  Have rubber bottoms.  Are comfortable and fit you well.  Are closed at the toe. Do not wear sandals.  If you use a stepladder:  Make sure that it is fully opened. Do not climb a closed stepladder.  Make sure that both sides of the stepladder are locked into place.  Ask someone to hold it for you, if possible.  Clearly mark and make sure that you can see:  Any grab bars or handrails.  First and last steps.  Where the edge of each step is.  Use tools that help you move around (mobility aids) if they are needed. These include:  Canes.  Walkers.  Scooters.  Crutches.  Turn on the lights when you go into a dark area. Replace any light bulbs as soon as they burn out.  Set up your furniture so you have a clear path. Avoid moving your furniture around.  If any of your floors are uneven, fix them.  If there are any pets around you, be aware of where they are.  Review your medicines with your doctor. Some medicines can make you feel dizzy. This can increase your chance of falling. Ask your doctor what other things that you can do to help prevent falls. This information is not intended to replace advice given to you by your health care provider. Make sure you discuss any questions you have with your health care provider. Document Released: 05/12/2009 Document Revised: 12/22/2015 Document Reviewed: 08/20/2014 Elsevier Interactive Patient Education  2017 [employer].

## 2020-05-04 ENCOUNTER — Ambulatory Visit (INDEPENDENT_AMBULATORY_CARE_PROVIDER_SITE_OTHER): Payer: Medicare Other | Admitting: Internal Medicine

## 2020-05-04 ENCOUNTER — Other Ambulatory Visit: Payer: Self-pay

## 2020-05-04 ENCOUNTER — Encounter: Payer: Self-pay | Admitting: Internal Medicine

## 2020-05-04 VITALS — BP 150/76 | HR 88 | Temp 97.8°F | Resp 16 | Ht 66.0 in | Wt 153.6 lb

## 2020-05-04 DIAGNOSIS — F325 Major depressive disorder, single episode, in full remission: Secondary | ICD-10-CM | POA: Diagnosis not present

## 2020-05-04 DIAGNOSIS — N3941 Urge incontinence: Secondary | ICD-10-CM

## 2020-05-04 DIAGNOSIS — E034 Atrophy of thyroid (acquired): Secondary | ICD-10-CM | POA: Diagnosis not present

## 2020-05-04 DIAGNOSIS — Z23 Encounter for immunization: Secondary | ICD-10-CM

## 2020-05-04 DIAGNOSIS — I672 Cerebral atherosclerosis: Secondary | ICD-10-CM

## 2020-05-04 DIAGNOSIS — E785 Hyperlipidemia, unspecified: Secondary | ICD-10-CM | POA: Diagnosis not present

## 2020-05-04 DIAGNOSIS — I701 Atherosclerosis of renal artery: Secondary | ICD-10-CM

## 2020-05-04 NOTE — Progress Notes (Signed)
Subjective:  Patient ID: Kathryn Wise, female    DOB: 03/10/1930  Age: 86 y.o. MRN: [id number]  CC: The primary encounter diagnosis was Need for immunization against influenza. Diagnoses of Urge incontinence, Hyperlipidemia LDL goal <130, Hypothyroidism due to acquired atrophy of thyroid, Major depressive disorder with single episode, in full remission ([place]), Urinary incontinence, urge, and Arteriosclerotic cerebrovascular disease were also pertinent to this visit.  HPI Kathryn Wise presents for 6 month follow up on chronic issues and recent evaluation  For TIA   This visit occurred during the SARS-CoV-2 public health emergency.  Safety protocols were in place, including screening questions prior to the visit, additional usage of staff PPE, and extensive cleaning of exam room while observing appropriate contact time as indicated for disinfecting solutions.   Reviewed recent evaluation for TIA symptoms which resulted in MRI brain and neurology evaluation by Dr Manuella Ghazi.  Patient is minimalizing the episode of confusion and left sided weakness, and was offended by Dr Trena Platt remarks regarding her current unsupervised state and lack of proximity to family members.  She is fiercely independent and has no intention of moving closer to her daughter.  She also stopped taking the newly prescribed rosuvastatin 2 days ago due to increased abdominal girth which she attributed to the medication.  She denies new myalgias,  Dark urine,  And nausea.  She eats a Mediterranean style diet with at least 2 servings of raw  cruciferous vegetables daily.  She is also bothered by new onset urinary urge incontinence.    Hypothyroidism;  Requested a referral to a specialist in August.  Referral was made to dr Honor Junes.  Patient cancelled the appt  because her problem "straightened itself out"   Hypertension: patient checks blood pressure twice weekly at home.  Readings have been for the most part  < 140/80 at rest . Patient  is following a reduce salt diet most days and is taking medications as prescribed  She has brought with her the results of a recent [facility] Screening ; mild carotid disease was noted,   L > R % and her   Hs CRP was high      Outpatient Medications Prior to Visit  Medication Sig Dispense Refill  . amLODipine (NORVASC) 5 MG tablet TAKE 2 TABLETS BY MOUTH ONCE DAILY 180 tablet 1  . Cholecalciferol (VITAMIN D-3) 1000 units CAPS Take 1 capsule by mouth daily.    . clopidogrel (PLAVIX) 75 MG tablet TAKE 1 TABLET BY MOUTH ONCE DAILY 30 tablet 4  . Cyanocobalamin (VITAMIN B 12) 100 MCG LOZG Take 1 tablet by mouth daily.     . furosemide (LASIX) 20 MG tablet Take 1 tablet (20 mg total) by mouth daily. 90 tablet 1  . levothyroxine (SYNTHROID) 88 MCG tablet TAKE 1 TABLET BY MOUTH ONCE DAILY ON AN EMPTY STOMACH. WAIT 30 MINUTES BEFORE TAKING OTHER MEDS. 90 tablet 1  . nitroGLYCERIN (NITROSTAT) 0.4 MG SL tablet Place 1 tablet (0.4 mg total) every 5 (five) minutes as needed under the tongue for chest pain. 30 tablet 2  . omeprazole (PRILOSEC) 20 MG capsule TAKE 1 CAPSULE BY MOUTH ONCE EVERY MORNING 90 capsule 2  . Potassium 99 MG TABS Take 99 mg by mouth daily.     . RESTASIS 0.05 % ophthalmic emulsion Place 1 drop into both eyes 2 (two) times daily.     [name] PRESERVATIVE FREE 0.4-0.3 % SOLN     . temazepam (RESTORIL) 15 MG capsule TAKE  1 CAPSULE BY MOUTH AT BEDTIME AS NEEDED SLEEP 30 capsule 0  . temazepam (RESTORIL) 15 MG capsule Take 1 capsule (15 mg total) by mouth at bedtime as needed for sleep. 30 capsule 4  . TURMERIC PO Take by mouth.    . rosuvastatin (CRESTOR) 10 MG tablet Take by mouth. (Patient not taking: Reported on 05/04/2020)     No facility-administered medications prior to visit.    Review of Systems;  Patient denies headache, fevers, malaise, unintentional weight loss, skin rash, eye pain, sinus congestion and sinus pain, sore throat, dysphagia,  hemoptysis , cough, dyspnea,  wheezing, chest pain, palpitations, orthopnea, edema, abdominal pain, nausea, melena, diarrhea, constipation, flank pain, dysuria, hematuria, urinary  Frequency, nocturia, numbness, tingling, seizures,  Focal weakness, Loss of consciousness,  Tremor, insomnia, depression, anxiety, and suicidal ideation.      Objective:  BP (!) 150/76 (BP Location: Left Arm, Patient Position: Sitting, Cuff Size: Normal)   Pulse 88   Temp 97.8 F (36.6 C) (Oral)   Resp 16   Ht 5\' 6"  (1.676 m)   Wt 153 lb 9.6 oz (69.7 kg)   SpO2 97%   BMI 24.79 kg/m   BP Readings from Last 3 Encounters:  05/04/20 (!) 150/76  04/20/20 136/64  02/23/20 (!) 120/62    Wt Readings from Last 3 Encounters:  05/04/20 153 lb 9.6 oz (69.7 kg)  04/20/20 151 lb (68.5 kg)  02/23/20 151 lb (68.5 kg)    General appearance: alert, cooperative and appears stated age Ears: normal TM's and external ear canals both ears Throat: lips, mucosa, and tongue normal; teeth and gums normal Neck: no adenopathy, no carotid bruit, supple, symmetrical, trachea midline and thyroid not enlarged, symmetric, no tenderness/mass/nodules Back: symmetric, no curvature. ROM normal. No CVA tenderness. Lungs: clear to auscultation bilaterally Heart: regular rate and rhythm, S1, S2 normal, no murmur, click, rub or gallop Abdomen: soft, non-tender; bowel sounds normal; no masses,  no organomegaly Pulses: 2+ and symmetric Skin: Skin color, texture, turgor normal. No rashes or lesions Lymph nodes: Cervical, supraclavicular, and axillary nodes normal.  Lab Results  Component Value Date   HGBA1C 5.9 11/03/2019   HGBA1C 6.3 10/01/2017   HGBA1C 6.1 12/08/2015    Lab Results  Component Value Date   CREATININE 0.91 05/04/2020   CREATININE 0.87 02/23/2020   CREATININE 0.95 11/03/2019    Lab Results  Component Value Date   WBC 8.8 02/23/2020   HGB 13.2 02/23/2020   HCT 40.1 02/23/2020   PLT 188.0 02/23/2020   GLUCOSE 94 05/04/2020   CHOL 146  05/04/2020   TRIG 108.0 05/04/2020   HDL 47.60 05/04/2020   LDLDIRECT 126.0 09/19/2016   LDLCALC 77 05/04/2020   ALT 10 05/04/2020   AST 18 05/04/2020   NA 136 05/04/2020   K 4.0 05/04/2020   CL 101 05/04/2020   CREATININE 0.91 05/04/2020   BUN 16 05/04/2020   CO2 29 05/04/2020   TSH 1.23 05/04/2020   INR 1.0 08/14/2019   HGBA1C 5.9 11/03/2019    MR BRAIN WO CONTRAST  Result Date: 03/15/2020 CLINICAL DATA:  Weakness EXAM: MRI HEAD WITHOUT CONTRAST TECHNIQUE: Multiplanar, multiecho pulse sequences of the brain and surrounding structures were obtained without intravenous contrast. COMPARISON:  02/23/2020 head CT and prior. 03/28/2019 MRA/MRV head and prior. FINDINGS: Brain: Scattered periventricular and subcortical white matter T2 hyperintense foci are nonspecific however commonly associated with chronic microvascular ischemic changes. Cerebral volume within normal limits. No acute infarct or intracranial hemorrhage. Remote  small left cerebellar insult. No midline shift, ventriculomegaly or extra-axial fluid collection. No mass lesion. Vascular: Normal flow voids. Skull and upper cervical spine: Normal marrow signal. Sinuses/Orbits: Normal orbits. Sequela of bilateral lens replacement. Clear paranasal sinuses. No mastoid effusion. Other: None. IMPRESSION: No acute intracranial process.  Small remote left cerebellar insult. Mild chronic microvascular ischemic changes. Electronically Signed   By: Primitivo Gauze M.D.   On: 03/15/2020 13:37    Assessment & Plan:   Problem List Items Addressed This Visit      Unprioritized   Arteriosclerotic cerebrovascular disease    Recent events were suggestive of TIA and MRI supports this possibility.  She was advised to continue plavix and statin       Relevant Medications   rosuvastatin (CRESTOR) 10 MG tablet   Hyperlipidemia LDL goal <130    Encouraged to resume/continue rosuvastatin  Given her normal liver enzymes today ,  Regardless of her  lipid status, given evidence of atherosclerosis in brain MRI and carotid doppler done by [facility].   Lab Results  Component Value Date   CHOL 146 05/04/2020   HDL 47.60 05/04/2020   LDLCALC 77 05/04/2020   LDLDIRECT 126.0 09/19/2016   TRIG 108.0 05/04/2020   CHOLHDL 3 05/04/2020   Lab Results  Component Value Date   ALT 10 05/04/2020   AST 18 05/04/2020   ALKPHOS 60 05/04/2020   BILITOT 0.7 05/04/2020         Relevant Medications   rosuvastatin (CRESTOR) 10 MG tablet   Other Relevant Orders   Comprehensive metabolic panel (Completed)   Lipid panel (Completed)   Hypothyroidism   Relevant Orders   TSH (Completed)   Major depressive disorder with single episode, in full remission ([place])   Urinary incontinence, urge    New onset. Urinalysis notable for significant pyuria , culture is pending        Other Visit Diagnoses    Need for immunization against influenza    -  Primary   Relevant Orders   Flu Vaccine QUAD High Dose(Fluad) (Completed)   Urge incontinence       Relevant Orders   Urinalysis, Routine w reflex microscopic (Completed)   Urine Culture    A total of 40 minutes was spent with patient more than half of which was spent in counseling patient on the above mentioned issues , reviewing and explaining recent labs and imaging studies done, and coordination of care.  I am having Rennae B. Dutta maintain her Restasis, Vitamin B 12, Vitamin D-3, Potassium, nitroGLYCERIN, furosemide, amLODipine, TURMERIC PO, omeprazole, Systane Preservative Free, temazepam, temazepam, levothyroxine, clopidogrel, and rosuvastatin.  No orders of the defined types were placed in this encounter.   There are no discontinued medications.  Follow-up: No follow-ups on file.   [name], MD

## 2020-05-04 NOTE — Patient Instructions (Addendum)
Try taking Beano  With ALL  meals containing vegetables   we will rule out a UTI today. If no infection,  We will start a medication for Overactive bladder   If you have another episode like the one in April ,  I strongly recommend seeing your cardiologist again

## 2020-05-05 DIAGNOSIS — I672 Cerebral atherosclerosis: Secondary | ICD-10-CM | POA: Insufficient documentation

## 2020-05-05 DIAGNOSIS — N3941 Urge incontinence: Secondary | ICD-10-CM | POA: Insufficient documentation

## 2020-05-05 LAB — COMPREHENSIVE METABOLIC PANEL
ALT: 10 U/L (ref 0–35)
AST: 18 U/L (ref 0–37)
Albumin: 4.2 g/dL (ref 3.5–5.2)
Alkaline Phosphatase: 60 U/L (ref 39–117)
BUN: 16 mg/dL (ref 6–23)
CO2: 29 mEq/L (ref 19–32)
Calcium: 9.6 mg/dL (ref 8.4–10.5)
Chloride: 101 mEq/L (ref 96–112)
Creatinine, Ser: 0.91 mg/dL (ref 0.40–1.20)
GFR: 56.3 mL/min — ABNORMAL LOW (ref >60.00)
Glucose, Bld: 94 mg/dL (ref 70–99)
Potassium: 4 mEq/L (ref 3.5–5.1)
Sodium: 136 mEq/L (ref 135–145)
Total Bilirubin: 0.7 mg/dL (ref 0.2–1.2)
Total Protein: 7 g/dL (ref 6.0–8.3)

## 2020-05-05 LAB — URINE CULTURE
MICRO NUMBER:: 11038979
SPECIMEN QUALITY:: ADEQUATE

## 2020-05-05 LAB — LIPID PANEL
Cholesterol: 146 mg/dL (ref 0–200)
HDL: 47.6 mg/dL (ref >39.00)
LDL Cholesterol: 77 mg/dL (ref 0–99)
NonHDL: 98.32
Total CHOL/HDL Ratio: 3
Triglycerides: 108 mg/dL (ref 0.0–149.0)
VLDL: 21.6 mg/dL (ref 0.0–40.0)

## 2020-05-05 LAB — URINALYSIS, ROUTINE W REFLEX MICROSCOPIC
Bilirubin Urine: NEGATIVE
Ketones, ur: NEGATIVE
Nitrite: NEGATIVE
Specific Gravity, Urine: 1.01 (ref 1.000–1.030)
Total Protein, Urine: NEGATIVE
Urine Glucose: NEGATIVE
Urobilinogen, UA: 0.2 (ref 0.0–1.0)
pH: 6 (ref 5.0–8.0)

## 2020-05-05 LAB — TSH: TSH: 1.23 u[IU]/mL (ref 0.35–4.50)

## 2020-05-05 NOTE — Assessment & Plan Note (Addendum)
New onset. Urinalysis notable for significant pyuria , culture is equivocal .  Will treat with cipro x 3 days

## 2020-05-05 NOTE — Assessment & Plan Note (Signed)
Recent events were suggestive of TIA and MRI supports this possibility.  She was advised to continue plavix and statin  

## 2020-05-05 NOTE — Assessment & Plan Note (Signed)
Encouraged to resume/continue rosuvastatin  Given her normal liver enzymes today ,  Regardless of her lipid status, given evidence of atherosclerosis in brain MRI and carotid doppler done by [facility].   Lab Results  Component Value Date   CHOL 146 05/04/2020   HDL 47.60 05/04/2020   LDLCALC 77 05/04/2020   LDLDIRECT 126.0 09/19/2016   TRIG 108.0 05/04/2020   CHOLHDL 3 05/04/2020   Lab Results  Component Value Date   ALT 10 05/04/2020   AST 18 05/04/2020   ALKPHOS 60 05/04/2020   BILITOT 0.7 05/04/2020

## 2020-05-06 ENCOUNTER — Other Ambulatory Visit: Payer: Self-pay | Admitting: Internal Medicine

## 2020-05-06 DIAGNOSIS — I10 Essential (primary) hypertension: Secondary | ICD-10-CM

## 2020-05-07 MED ORDER — CIPROFLOXACIN HCL 250 MG PO TABS: 250.0000 mg | ORAL_TABLET | Freq: Two times a day (BID) | ORAL | 0 refills | Status: DC

## 2020-05-07 NOTE — Progress Notes (Signed)
I am treating  you for a  Urinary tract infection  even though the culture was ambiguous because the urinalysis did suggest inflammation and infection.  Please take the ciprofloxacin  twice daily for 3 days.  If the incontinence symptoms continue,  let me know and I will send in a medication to try as discussed.   Please take a probiotic ( Align, Flora que or [employer]) for 2 weeks to prevent a serious antibiotic associated diarrhea  Called" clostridium difficile colitis" ( this should also help prevent  a  vaginal yeast infection)    Your cholesterol responded very well to the Crestor, and the rest of your labs are normal.  Please resume the crestor.  Regards,   Deborra Medina, MD

## 2020-05-07 NOTE — Addendum Note (Signed)
Addended by: [name] on: 05/07/2020 04:50 PM   Modules accepted: Orders

## 2020-05-11 NOTE — Progress Notes (Signed)
By now she has probably finished taking the antibiotics.  IT's ok if she did not.

## 2020-05-12 ENCOUNTER — Telehealth: Payer: Self-pay

## 2020-05-12 NOTE — Telephone Encounter (Signed)
Pt returned your call.  

## 2020-05-13 NOTE — Telephone Encounter (Signed)
LMTCB

## 2020-05-23 ENCOUNTER — Other Ambulatory Visit: Payer: Self-pay | Admitting: Internal Medicine

## 2020-05-23 NOTE — Telephone Encounter (Signed)
LS:05-04-20 LO:12-10-19

## 2020-06-15 DIAGNOSIS — M25562 Pain in left knee: Secondary | ICD-10-CM | POA: Diagnosis not present

## 2020-07-13 DIAGNOSIS — Z23 Encounter for immunization: Secondary | ICD-10-CM | POA: Diagnosis not present

## 2020-07-13 DIAGNOSIS — M25562 Pain in left knee: Secondary | ICD-10-CM | POA: Diagnosis not present

## 2020-07-19 ENCOUNTER — Other Ambulatory Visit: Payer: Self-pay | Admitting: Internal Medicine

## 2020-08-01 ENCOUNTER — Other Ambulatory Visit: Payer: Self-pay | Admitting: Internal Medicine

## 2020-08-08 ENCOUNTER — Other Ambulatory Visit (INDEPENDENT_AMBULATORY_CARE_PROVIDER_SITE_OTHER): Payer: Self-pay | Admitting: Vascular Surgery

## 2020-08-17 ENCOUNTER — Encounter: Payer: Medicare Other | Admitting: Dermatology

## 2020-08-22 ENCOUNTER — Encounter: Payer: Self-pay | Admitting: Dermatology

## 2020-08-22 ENCOUNTER — Other Ambulatory Visit: Payer: Self-pay

## 2020-08-22 ENCOUNTER — Ambulatory Visit (INDEPENDENT_AMBULATORY_CARE_PROVIDER_SITE_OTHER): Payer: Medicare Other | Admitting: Dermatology

## 2020-08-22 DIAGNOSIS — Z85828 Personal history of other malignant neoplasm of skin: Secondary | ICD-10-CM | POA: Diagnosis not present

## 2020-08-22 DIAGNOSIS — L820 Inflamed seborrheic keratosis: Secondary | ICD-10-CM | POA: Diagnosis not present

## 2020-08-22 DIAGNOSIS — Z1283 Encounter for screening for malignant neoplasm of skin: Secondary | ICD-10-CM

## 2020-08-22 DIAGNOSIS — C44729 Squamous cell carcinoma of skin of left lower limb, including hip: Secondary | ICD-10-CM

## 2020-08-22 DIAGNOSIS — D692 Other nonthrombocytopenic purpura: Secondary | ICD-10-CM | POA: Diagnosis not present

## 2020-08-22 DIAGNOSIS — B353 Tinea pedis: Secondary | ICD-10-CM

## 2020-08-22 DIAGNOSIS — L570 Actinic keratosis: Secondary | ICD-10-CM | POA: Diagnosis not present

## 2020-08-22 DIAGNOSIS — L821 Other seborrheic keratosis: Secondary | ICD-10-CM | POA: Diagnosis not present

## 2020-08-22 DIAGNOSIS — D229 Melanocytic nevi, unspecified: Secondary | ICD-10-CM

## 2020-08-22 DIAGNOSIS — D485 Neoplasm of uncertain behavior of skin: Secondary | ICD-10-CM | POA: Diagnosis not present

## 2020-08-22 DIAGNOSIS — D1800 Hemangioma unspecified site: Secondary | ICD-10-CM

## 2020-08-22 DIAGNOSIS — L814 Other melanin hyperpigmentation: Secondary | ICD-10-CM | POA: Diagnosis not present

## 2020-08-22 DIAGNOSIS — L578 Other skin changes due to chronic exposure to nonionizing radiation: Secondary | ICD-10-CM

## 2020-08-22 MED ORDER — KETOCONAZOLE 2 % EX CREA: TOPICAL_CREAM | CUTANEOUS | 3 refills | Status: DC

## 2020-08-22 NOTE — Patient Instructions (Signed)

## 2020-08-22 NOTE — Progress Notes (Signed)
Follow-Up Visit   Subjective  Kathryn Wise is a 87 y.o. female who presents for the following: Annual Exam (Hx SCC ). Irritated lesions on the leg and right neck.  The patient presents for Total-Body Skin Exam (TBSE) for skin cancer screening and mole check.  The following portions of the chart were reviewed this encounter and updated as appropriate:   Tobacco  Allergies  Meds  Problems  Med Hx  Surg Hx  Fam Hx     Review of Systems:  No other skin or systemic complaints except as noted in HPI or Assessment and Plan.  Objective  Well appearing patient in no apparent distress; mood and affect are within normal limits.  A full examination was performed including scalp, head, eyes, ears, nose, lips, neck, chest, axillae, abdomen, back, buttocks, bilateral upper extremities, bilateral lower extremities, hands, feet, fingers, toes, fingernails, and toenails. All findings within normal limits unless otherwise noted below.  Objective  R neck, B/L leg x 18 (19): Erythematous keratotic or waxy stuck-on papule or plaque.   Objective  R mid lat pretibial: 1.3 Hyperkeratotic papule  Objective  L distal med popliteal: 1.1 cm Hyperkeratotic papule   Objective  B/L feet: Scale of the feet  Assessment & Plan  Inflamed seborrheic keratosis (19) R neck, B/L leg x 18  Destruction of lesion - R neck, B/L leg x 18 Complexity: simple   Destruction method: cryotherapy   Informed consent: discussed and consent obtained   Timeout:  patient name, date of birth, surgical site, and procedure verified Lesion destroyed using liquid nitrogen: Yes   Region frozen until ice ball extended beyond lesion: Yes   Outcome: patient tolerated procedure well with no complications   Post-procedure details: wound care instructions given    Neoplasm of uncertain behavior of skin (2) R mid lat pretibial  Epidermal / dermal shaving  Lesion diameter (cm):  1.3 Informed consent: discussed and consent  obtained   Timeout: patient name, date of birth, surgical site, and procedure verified   Procedure prep:  Patient was prepped and draped in usual sterile fashion Prep type:  Isopropyl alcohol Anesthesia: the lesion was anesthetized in a standard fashion   Anesthetic:  1% lidocaine w/ epinephrine 1-100,000 buffered w/ 8.4% NaHCO3 Instrument used: flexible razor blade   Hemostasis achieved with: pressure, aluminum chloride and electrodesiccation   Outcome: patient tolerated procedure well   Post-procedure details: sterile dressing applied and wound care instructions given   Dressing type: bandage and petrolatum    Destruction of lesion Complexity: extensive   Destruction method: electrodesiccation and curettage   Informed consent: discussed and consent obtained   Timeout:  patient name, date of birth, surgical site, and procedure verified Procedure prep:  Patient was prepped and draped in usual sterile fashion Prep type:  Isopropyl alcohol Anesthesia: the lesion was anesthetized in a standard fashion   Anesthetic:  1% lidocaine w/ epinephrine 1-100,000 buffered w/ 8.4% NaHCO3 Curettage performed in three different directions: Yes   Electrodesiccation performed over the curetted area: Yes   Lesion length (cm):  1.3 Lesion width (cm):  1.3 Margin per side (cm):  0.2 Final wound size (cm):  1.7 Hemostasis achieved with:  pressure, aluminum chloride and electrodesiccation Outcome: patient tolerated procedure well with no complications   Post-procedure details: sterile dressing applied and wound care instructions given   Dressing type: bandage and petrolatum    Specimen 1 - Surgical pathology Differential Diagnosis: D48.5 r/o SCC vs other  Check Margins:  No 1.3 Hyperkeratotic papule ED&C today   L distal med popliteal  Epidermal / dermal shaving  Lesion diameter (cm):  1.1 Informed consent: discussed and consent obtained   Timeout: patient name, date of birth, surgical site, and  procedure verified   Procedure prep:  Patient was prepped and draped in usual sterile fashion Prep type:  Isopropyl alcohol Anesthesia: the lesion was anesthetized in a standard fashion   Anesthetic:  1% lidocaine w/ epinephrine 1-100,000 buffered w/ 8.4% NaHCO3 Instrument used: flexible razor blade   Hemostasis achieved with: pressure, aluminum chloride and electrodesiccation   Outcome: patient tolerated procedure well   Post-procedure details: sterile dressing applied and wound care instructions given   Dressing type: bandage and petrolatum    Destruction of lesion Complexity: extensive   Destruction method: electrodesiccation and curettage   Informed consent: discussed and consent obtained   Timeout:  patient name, date of birth, surgical site, and procedure verified Procedure prep:  Patient was prepped and draped in usual sterile fashion Prep type:  Isopropyl alcohol Anesthesia: the lesion was anesthetized in a standard fashion   Anesthetic:  1% lidocaine w/ epinephrine 1-100,000 buffered w/ 8.4% NaHCO3 Curettage performed in three different directions: Yes   Electrodesiccation performed over the curetted area: Yes   Lesion length (cm):  1.1 Lesion width (cm):  1.1 Margin per side (cm):  0.2 Final wound size (cm):  1.5 Hemostasis achieved with:  pressure, aluminum chloride and electrodesiccation Outcome: patient tolerated procedure well with no complications   Post-procedure details: sterile dressing applied and wound care instructions given   Dressing type: bandage and petrolatum    Specimen 2 - Surgical pathology Differential Diagnosis: D4/8.5 r/o SCC vs other  ED&C today  Check Margins: No 1.1 cm Hyperkeratotic papule  Tinea pedis of both feet B/L feet Chronic; persistent Start ketoconazole cream nightly to the feet. ketoconazole (NIZORAL) 2 % cream - B/L feet   Lentigines - Scattered tan macules - Discussed due to sun exposure - Benign, observe - Call for any  changes  Seborrheic Keratoses - Stuck-on, waxy, tan-brown papules and plaques  - Discussed benign etiology and prognosis. - Observe - Call for any changes  Melanocytic Nevi - Tan-brown and/or pink-flesh-colored symmetric macules and papules - Benign appearing on exam today - Observation - Call clinic for new or changing moles - Recommend daily use of broad spectrum spf 30+ sunscreen to sun-exposed areas.   Hemangiomas - Red papules - Discussed benign nature - Observe - Call for any changes  Purpura - Chronic; persistent and recurrent.  Treatable, but not curable. - Violaceous macules and patches - Benign - Related to trauma, age, sun damage and/or use of blood thinners, chronic use of topical and/or oral steroids - Observe - Can use OTC arnica containing moisturizer such as Dermend Bruise Formula if desired - Call for worsening or other concerns  Actinic Damage - Chronic, secondary to cumulative UV/sun exposure - diffuse scaly erythematous macules with underlying dyspigmentation - Recommend daily broad spectrum sunscreen SPF 30+ to sun-exposed areas, reapply every 2 hours as needed.  - Call for new or changing lesions.  History of Squamous Cell Carcinoma of the Skin - No evidence of recurrence today - No lymphadenopathy - Recommend regular full body skin exams - Recommend daily broad spectrum sunscreen SPF 30+ to sun-exposed areas, reapply every 2 hours as needed.  - Call if any new or changing lesions are noted between office visits  Skin cancer screening performed today.  Return in  about 6 months (around 02/19/2021) for recheck bx sites and ISK's .   Luther Redo, CMA, am acting as scribe for Sarina Ser, MD .  Documentation: I have reviewed the above documentation for accuracy and completeness, and I agree with the above.  Sarina Ser, MD

## 2020-08-23 ENCOUNTER — Encounter: Payer: Self-pay | Admitting: Dermatology

## 2020-08-24 ENCOUNTER — Telehealth: Payer: Self-pay

## 2020-08-24 NOTE — Telephone Encounter (Signed)
Advised patient of biopsy results/hd 

## 2020-08-24 NOTE — Telephone Encounter (Signed)
-----   Message from Ralene Bathe, MD sent at 08/23/2020  5:34 PM EST ----- Diagnosis 1. Skin , right mid lat pretibial HYPERTROPHIC ACTINIC KERATOSIS 2. Skin , left distal med popliteal WELL DIFFERENTIATED SQUAMOUS CELL CARCINOMA  1- PreCancer Already treated 2- Cancer - SCC Already treated Recheck both at next visit

## 2020-09-01 ENCOUNTER — Other Ambulatory Visit: Payer: Self-pay | Admitting: Ophthalmology

## 2020-09-01 DIAGNOSIS — G453 Amaurosis fugax: Secondary | ICD-10-CM

## 2020-09-01 DIAGNOSIS — H04123 Dry eye syndrome of bilateral lacrimal glands: Secondary | ICD-10-CM | POA: Diagnosis not present

## 2020-09-08 ENCOUNTER — Other Ambulatory Visit: Payer: Self-pay

## 2020-09-08 ENCOUNTER — Ambulatory Visit
Admission: RE | Admit: 2020-09-08 | Discharge: 2020-09-08 | Disposition: A | Discharge status: Home / Self Care | Payer: Medicare Other | Source: Ambulatory Visit | Attending: Ophthalmology | Admitting: Ophthalmology

## 2020-09-08 DIAGNOSIS — I6523 Occlusion and stenosis of bilateral carotid arteries: Secondary | ICD-10-CM | POA: Diagnosis not present

## 2020-09-08 DIAGNOSIS — Z8673 Personal history of transient ischemic attack (TIA), and cerebral infarction without residual deficits: Secondary | ICD-10-CM | POA: Diagnosis not present

## 2020-09-08 DIAGNOSIS — G453 Amaurosis fugax: Secondary | ICD-10-CM | POA: Insufficient documentation

## 2020-09-12 ENCOUNTER — Other Ambulatory Visit: Payer: Self-pay

## 2020-09-12 ENCOUNTER — Ambulatory Visit (INDEPENDENT_AMBULATORY_CARE_PROVIDER_SITE_OTHER): Payer: Medicare Other | Admitting: Vascular Surgery

## 2020-09-12 ENCOUNTER — Encounter (INDEPENDENT_AMBULATORY_CARE_PROVIDER_SITE_OTHER): Payer: Self-pay | Admitting: Vascular Surgery

## 2020-09-12 ENCOUNTER — Telehealth (INDEPENDENT_AMBULATORY_CARE_PROVIDER_SITE_OTHER): Payer: Self-pay | Admitting: Vascular Surgery

## 2020-09-12 VITALS — BP 142/70 | HR 80 | Resp 16 | Wt 157.4 lb

## 2020-09-12 DIAGNOSIS — I150 Renovascular hypertension: Secondary | ICD-10-CM | POA: Diagnosis not present

## 2020-09-12 DIAGNOSIS — E785 Hyperlipidemia, unspecified: Secondary | ICD-10-CM | POA: Diagnosis not present

## 2020-09-12 DIAGNOSIS — I6529 Occlusion and stenosis of unspecified carotid artery: Secondary | ICD-10-CM | POA: Insufficient documentation

## 2020-09-12 DIAGNOSIS — I701 Atherosclerosis of renal artery: Secondary | ICD-10-CM

## 2020-09-12 DIAGNOSIS — I6523 Occlusion and stenosis of bilateral carotid arteries: Secondary | ICD-10-CM | POA: Diagnosis not present

## 2020-09-12 DIAGNOSIS — I890 Lymphedema, not elsewhere classified: Secondary | ICD-10-CM

## 2020-09-12 DIAGNOSIS — I2510 Atherosclerotic heart disease of native coronary artery without angina pectoris: Secondary | ICD-10-CM | POA: Diagnosis not present

## 2020-09-12 NOTE — Telephone Encounter (Signed)
Patient had a visit with GS today 09/12/20 he informed me he is going to put in an order for patient to have a CT done, he asked me to put a note in so when patient makes [place] aware she has been scheduled for her CT that we get her in with a renal duplex as well as CT results.  This note is for documentation purposes only.

## 2020-09-12 NOTE — Progress Notes (Signed)
MRN : [id number]  Kathryn Wise is a 87 y.o. (21-Nov-1929) female who presents with chief complaint of No chief complaint on file. [name]  History of Present Illness:   The patient is seen for evaluation of carotid stenosis. The carotid stenosis was identified after evaluation by opthamology.  The patient denies amaurosis fugax. There is no recent history of TIA symptoms or focal motor deficits. There is no prior documented CVA.  There is no history of migraine headaches. There is no history of seizures.  The patient is taking enteric-coated aspirin 81 mg daily.  Patient is also seen for leg pain and leg swelling. The patient first noticed the swelling remotely. The swelling is associated with pain and discoloration. The pain and swelling worsens with prolonged dependency and improves with elevation. The pain is unrelated to activity.  The patient notes that in the morning the legs are significantly improved but they steadily worsened throughout the course of the day. The patient also notes a steady worsening of the discoloration in the ankle and shin area.   The patient denies claudication symptoms.  The patient denies symptoms consistent with rest pain.  The patient denies and extensive history of DJD and LS spine disease.  The patient has no had any past angiography, interventions or vascular surgery.  Elevation makes the leg symptoms better, dependency makes them much worse. There is no history of ulcerations. The patient denies any recent changes in medications.  The patient has been wearing graduated compression for years and the degree of edema and the pain have been increasing.  The patient denies a history of DVT or PE. There is no prior history of phlebitis. There is no history of primary lymphedema.  No history of malignancies. No history of trauma or groin or pelvic surgery. There is no history of radiation treatment to the groin or pelvis  The patient has a history of  coronary artery disease, no recent episodes of angina or shortness of breath. The patient denies PAD or claudication symptoms. There is a history of hyperlipidemia which is being treated with a statin.    No outpatient medications have been marked as taking for the 09/12/20 encounter (Appointment) with Delana Meyer, Dolores Lory, MD.    Past Medical History:  Diagnosis Date  . Arthritis   . Dysphagia, pharyngoesophageal phase 09/10/2013   Upper GI study with barium swallow was done at  [facility] Mar 2015   No reflux seen  Small irreducible hiatal hernia  Mild changes of presbyeophagus (abnormal contractions of the esophagus that occur with aging) No strictures Normal gastric emptying  Incomplete visualization of stomach fold due to patient's inability turn    . GERD (gastroesophageal reflux disease)   . Heart murmur    has had years and years  . History of shingles Dec 2013   treated with steroids , post op from shoulder surgery  . History of squamous cell carcinoma 05/02/2016   right mid lateral pretibial  . Hypertension   . Hypothyroidism   . Major depressive disorder, single episode 11/05/2015  . Osteoarthritis of left shoulder 07/14/2012  . Polymyalgia rheumatica ([place])   . Squamous cell carcinoma of skin 05/12/2016   R mid lat pretibial - other skin cancers treated by Dr. Sharlett Iles  . Squamous cell carcinoma of skin 08/22/2020   left distal medial popliteal - [facility]    Past Surgical History:  Procedure Laterality Date  . ARTHOSCOPIC ROTAOR CUFF REPAIR     LEFT  10+  YEARS    . CATARACT EXTRACTION W/ INTRAOCULAR LENS IMPLANT     RIGHT EYE  . CATARACT EXTRACTION W/PHACO Left 03/28/2016   Procedure: CATARACT EXTRACTION PHACO AND INTRAOCULAR LENS PLACEMENT ([facility]);  Surgeon: Leandrew Koyanagi, MD;  Location: [place];  Service: Ophthalmology;  Laterality: Left;  TORIC  . FACELIFT    . FOOT SURGERY     rt foot   TUMOR REMOVED  . JOINT REPLACEMENT Left Dec 2013   shoulder  . LEFT  HEART CATH AND CORONARY ANGIOGRAPHY N/A 06/12/2017   Procedure: LEFT HEART CATH AND CORONARY ANGIOGRAPHY;  Surgeon: Minna Merritts, MD;  Location: [place] [facility];  Service: Cardiovascular;  Laterality: N/A;  . RENAL ANGIOGRAPHY Left 01/07/2019   Procedure: RENAL ANGIOGRAPHY;  Surgeon: Katha Cabal, MD;  Location: [place] [facility];  Service: Cardiovascular;  Laterality: Left;  . RENAL ARTERY STENT    . SKIN CANCER EXCISION    . TOTAL SHOULDER ARTHROPLASTY  07/14/2012   Procedure: TOTAL SHOULDER ARTHROPLASTY;  Surgeon: Johnny Bridge, MD;  Location: [place];  Service: Orthopedics;  Laterality: Left;    Social History Social History   Tobacco Use  . Smoking status: Former Smoker    Types: Cigarettes    Quit date: 07/30/1972    Years since quitting: 48.1  . Smokeless tobacco: Never Used  . Tobacco comment: smoked for only a few months  Vaping Use  . Vaping Use: Never used  Substance Use Topics  . Alcohol use: Yes    Comment: OCCAS WINE   . Drug use: No    Family History Family History  Problem Relation Age of Onset  . Cancer Daughter        Breast  . Breast cancer Daughter 73  . Cancer Son     Allergies  Allergen Reactions  . Alendronate Swelling    And nausea  And nausea  And nausea   . Lisinopril     Spike in BP  . Metoprolol Other (See Comments)    lethargy  . Oxycodone     Other reaction(s): Other (See Comments) Altered mental status  . Oxycontin [Oxycodone Hcl]     Altered mental status     REVIEW OF SYSTEMS (Negative unless checked)  Constitutional: [] Weight loss  [] Fever  [] Chills Cardiac: [] Chest pain   [] Chest pressure   [] Palpitations   [] Shortness of breath when laying flat   [] Shortness of breath with exertion. Vascular:  [] Pain in legs with walking   [x] Pain in legs at rest  [] History of DVT   [] Phlebitis   [x] Swelling in legs   [] Varicose veins   [] Non-healing ulcers Pulmonary:   [] Uses home oxygen   [] Productive cough    [] Hemoptysis   [] Wheeze  [] COPD   [] Asthma Neurologic:  [x] Dizziness   [] Seizures   [] History of stroke   [x] History of TIA  [] Aphasia   [] Vissual changes   [] Weakness or numbness in arm   [] Weakness or numbness in leg Musculoskeletal:   [] Joint swelling   [] Joint pain   [] Low back pain Hematologic:  [] Easy bruising  [] Easy bleeding   [] Hypercoagulable state   [] Anemic Gastrointestinal:  [] Diarrhea   [] Vomiting  [] Gastroesophageal reflux/heartburn   [] Difficulty swallowing. Genitourinary:  [] Chronic kidney disease   [] Difficult urination  [] Frequent urination   [] Blood in urine Skin:  [] Rashes   [] Ulcers  Psychological:  [] History of anxiety   []  History of major depression.  Physical Examination  There were no vitals filed  for this visit. There is no height or weight on file to calculate BMI. Gen: WD/WN, NAD Head: [place]/AT, No temporalis wasting.  Ear/Nose/Throat: Hearing grossly intact, nares w/o erythema or drainage Eyes: PER, EOMI, sclera nonicteric.  Neck: Supple, no large masses.   Pulmonary:  Good air movement, no audible wheezing bilaterally, no use of accessory muscles.  Cardiac: RRR, no JVD Vascular: no carotid bruits; scattered varicosities present bilaterally.  Moderate venous stasis changes to the legs bilaterally.  3-4+ soft pitting edema Vessel Right Left  Radial Palpable Palpable  Carotid Palpable Palpable  PT Palpable Palpable  DP Palpable Palpable  Gastrointestinal: Non-distended. No guarding/no peritoneal signs.  Musculoskeletal: M/S 5/5 throughout.  No deformity or atrophy.  Neurologic: CN 2-12 intact. Symmetrical.  Speech is fluent. Motor exam as listed above. Psychiatric: Judgment intact, Mood & affect appropriate for pt's clinical situation. Dermatologic: Moderate venous rashes no ulcers noted.  No changes consistent with cellulitis. Lymph : + lichenification / skin changes of chronic lymphedema.  CBC Lab Results  Component Value Date   WBC 8.8 02/23/2020    HGB 13.2 02/23/2020   HCT 40.1 02/23/2020   MCV 85.0 02/23/2020   PLT 188.0 02/23/2020    BMET    Component Value Date/Time   NA 136 05/04/2020 1458   NA 138 06/07/2014 0000   K 4.0 05/04/2020 1458   CL 101 05/04/2020 1458   CO2 29 05/04/2020 1458   GLUCOSE 94 05/04/2020 1458   BUN 16 05/04/2020 1458   BUN 20 06/07/2014 0000   CREATININE 0.91 05/04/2020 1458   CREATININE 1.14 (H) 12/03/2018 1417   CALCIUM 9.6 05/04/2020 1458   GFRNONAA 59 (L) 08/14/2019 1536   GFRAA >60 08/14/2019 1536   CrCl cannot be calculated (Patient's most recent lab result is older than the maximum 21 days allowed.).  COAG Lab Results  Component Value Date   INR 1.0 08/14/2019   INR 1.0 11/08/2018   INR 0.98 07/09/2012    Radiology US Carotid Bilateral  Result Date: 09/08/2020 CLINICAL DATA:  Amaurosis fugax EXAM: BILATERAL CAROTID DUPLEX ULTRASOUND TECHNIQUE: Pearline Cables scale imaging, color Doppler and duplex ultrasound were performed of bilateral carotid and vertebral arteries in the neck. COMPARISON:  None. FINDINGS: Criteria: Quantification of carotid stenosis is based on velocity parameters that correlate the residual internal carotid diameter with NASCET-based stenosis levels, using the diameter of the distal internal carotid lumen as the denominator for stenosis measurement. The following velocity measurements were obtained: RIGHT ICA: 139/30 cm/sec CCA: 96/29 cm/sec SYSTOLIC ICA/CCA RATIO:  1.5 ECA:  124 cm/sec LEFT ICA: 274/114 cm/sec CCA: 52/8 cm/sec SYSTOLIC ICA/CCA RATIO:  2.0 ECA:  99 cm/sec RIGHT CAROTID ARTERY: Heterogeneous atherosclerotic plaque in the proximal internal carotid artery. By peak systolic velocity criteria, the estimated stenosis falls in the 50-69% diameter range. RIGHT VERTEBRAL ARTERY:  Patent with normal antegrade flow. LEFT CAROTID ARTERY: Heterogeneous atherosclerotic plaque in the proximal internal carotid artery. By peak systolic velocity criteria, the estimated stenosis is  greater than 70%. The peak systolic velocity is significantly elevated to 274 centimeters/second. Additionally, there is spectral broadening and high velocity jetting. LEFT VERTEBRAL ARTERY:  Patent with normal antegrade flow. IMPRESSION: 1. Severe (70-99%) stenosis proximal left internal carotid artery secondary to bulky heterogeneous atherosclerotic plaque. 2. Moderate (50-69%) stenosis proximal right internal carotid artery secondary to heterogenous atherosclerotic plaque. 3. Vertebral arteries are patent with normal antegrade flow. Signed, Criselda Peaches, MD, [facility] Electronically Signed  By: Jacqulynn Cadet M.D.   On: 09/08/2020 16:06    Assessment/Plan 1. Bilateral carotid artery stenosis The patient remains asymptomatic with respect to the carotid stenosis.  However, the patient has now progressed and has a lesion the is >70%.  Patient should undergo CT angiography of the carotid arteries to define the degree of stenosis of the internal carotid arteries bilaterally and the anatomic suitability for surgery vs. intervention.  If the patient does indeed need surgery cardiac clearance will be required, once cleared the patient will be scheduled for surgery.  The risks, benefits and alternative therapies were reviewed in detail with the patient.  All questions were answered.  The patient agrees to proceed with imaging.  Continue antiplatelet therapy as prescribed. Continue management of CAD, HTN and Hyperlipidemia. Healthy heart diet, encouraged exercise at least 4 times per week.   - CT ANGIO NECK W OR WO CONTRAST; Future  2. Lymphedema Recommend:  No surgery or intervention at this point in time.    I have reviewed my previous discussion with the patient regarding swelling and why it causes symptoms.  Patient will continue wearing graduated compression stockings class 1 (20-30 mmHg) on a daily basis. The patient  will  beginning wearing the stockings first thing in the morning and removing them in the evening. The patient is instructed specifically not to sleep in the stockings.    In addition, behavioral modification including several periods of elevation of the lower extremities during the day will be continued.  This was reviewed with the patient during the initial visit.  The patient will also continue routine exercise, especially walking on a daily basis as was discussed during the initial visit.    Despite conservative treatments including graduated compression therapy class 1 and behavioral modification including exercise and elevation the patient  has not obtained adequate control of the lymphedema.  The patient still has stage 3 lymphedema and therefore, I believe that a lymph pump should be added to improve the control of the patient's lymphedema.  Additionally, a lymph pump is warranted because it will reduce the risk of cellulitis and ulceration in the future.  Patient should follow-up in six months   3. Renal artery stenosis (HCC) Given patient's arterial disease optimal control of the patient's hypertension is important. BP is acceptable today  The patient's vital signs and noninvasive studies support the renal artery stenosis is not significantly increased when compared to the previous study.  No invasive studies or intervention is indicated at this time.  The patient will continue the current antihypertensive medications, no changes at this time.   4. Coronary artery disease involving native coronary artery of native heart without angina pectoris Continue cardiac and antihypertensive medications as already ordered and reviewed, no changes at this time.  Continue statin as ordered and reviewed, no changes at this time  Nitrates PRN for chest pain   5. Renovascular hypertension Continue antihypertensive medications as already ordered, these medications have been reviewed and there  are no changes at this time.   6. Hyperlipidemia LDL goal <130 Continue statin as ordered and reviewed, no changes at this time     [name], MD  09/12/2020 10:41 AM

## 2020-09-13 ENCOUNTER — Encounter (INDEPENDENT_AMBULATORY_CARE_PROVIDER_SITE_OTHER): Payer: Self-pay | Admitting: Vascular Surgery

## 2020-09-21 ENCOUNTER — Other Ambulatory Visit: Payer: Self-pay

## 2020-09-21 ENCOUNTER — Ambulatory Visit (INDEPENDENT_AMBULATORY_CARE_PROVIDER_SITE_OTHER): Payer: Medicare Other | Admitting: Internal Medicine

## 2020-09-21 ENCOUNTER — Encounter: Payer: Self-pay | Admitting: Internal Medicine

## 2020-09-21 VITALS — BP 160/68 | HR 93 | Temp 98.0°F | Ht 65.98 in | Wt 157.5 lb

## 2020-09-21 DIAGNOSIS — E785 Hyperlipidemia, unspecified: Secondary | ICD-10-CM | POA: Diagnosis not present

## 2020-09-21 DIAGNOSIS — E034 Atrophy of thyroid (acquired): Secondary | ICD-10-CM | POA: Diagnosis not present

## 2020-09-21 DIAGNOSIS — I150 Renovascular hypertension: Secondary | ICD-10-CM

## 2020-09-21 DIAGNOSIS — R7303 Prediabetes: Secondary | ICD-10-CM

## 2020-09-21 DIAGNOSIS — R251 Tremor, unspecified: Secondary | ICD-10-CM

## 2020-09-21 DIAGNOSIS — I672 Cerebral atherosclerosis: Secondary | ICD-10-CM | POA: Diagnosis not present

## 2020-09-21 DIAGNOSIS — I6522 Occlusion and stenosis of left carotid artery: Secondary | ICD-10-CM

## 2020-09-21 DIAGNOSIS — I701 Atherosclerosis of renal artery: Secondary | ICD-10-CM | POA: Diagnosis not present

## 2020-09-21 LAB — POCT GLYCOSYLATED HEMOGLOBIN (HGB A1C): Hemoglobin A1C: 5.7 % — AB (ref 4.0–5.6)

## 2020-09-21 MED ORDER — METOPROLOL SUCCINATE ER 25 MG PO TB24: 25.0000 mg | ORAL_TABLET | Freq: Every day | ORAL | 3 refills | Status: DC

## 2020-09-21 NOTE — Assessment & Plan Note (Signed)
Taking plavi and statin

## 2020-09-21 NOTE — Patient Instructions (Signed)
I want you to add back a low dose of metoprolol in the evening .  This is to keep your heart from becoming too muscular .  Dr Rockey Situ recommended it

## 2020-09-21 NOTE — Progress Notes (Signed)
Subjective:  Patient ID: Kathryn Wise, female    DOB: 1930/01/19  Age: 87 y.o. MRN: [id number]  CC: The primary encounter diagnosis was Arteriosclerotic cerebrovascular disease. Diagnoses of Hypothyroidism due to acquired atrophy of thyroid, Prediabetes, Stenosis of left carotid artery, Renovascular hypertension, Hyperlipidemia LDL goal <130, and Tremulousness were also pertinent to this visit.  HPI Kathryn Wise presents for follow up on chronic conditions including hypertension, hypothyroidism, hyperlipidemia, and insomnia    This visit occurred during the SARS-CoV-2 public health emergency.  Safety protocols were in place, including screening questions prior to the visit, additional usage of staff PPE, and extensive cleaning of exam room while observing appropriate contact time as indicated for disinfecting solutions.   Kathryn Wise is a delightful 87 yr old woman with a history of hypothyroidism, acquired, labile hypertension ,  PAD and OA who presents for follow up on multiple issues:    1)  Episodes of feeling weak and trembly  brought on by during work in the home or in the yard ,  But have also occurred in the morning without physical exertion.  Most recent episode occurred today and lasted 4 hours before resolving spontaneously.  No relation to eating or fasting . Denies rapid heart rate,  Chest pain,  Jaw pain and dyspnea during episodes.     2) vision blurring  Saw Kathryn Wise was Referred to [facility], found to have worsening stenosis of left carotid :   3) carotid stenosis  Recent dopplers note worsening on the left now >  70%   Taking plavix,  no aspirin ,  and crestor  (last dose yesterday  PM)     4) labile HTN :  Measuring 937 to 169 systolic at home   On amlodipine only taking 5 mg daily (not 5 mg bid )  .  Review of office notes indicate that she should be on metoprolol for LVH  per [place] 2018 ECHO but has not taken in quite a while due to intolerance (per 2019 notes) .  Also not  taking telmisartan or lisinopril  (had a "spike" in BP on lisinopril)   5) had steroid injection left knee by orthopedics  for treatment of pain and swelling .  States that the injection was ineffective,  Then after several weeks,  The swelling suddenly resolved overnight .   Lab Results  Component Value Date   TSH 1.67 09/21/2020      Outpatient Medications Prior to Visit  Medication Sig Dispense Refill  . amLODipine (NORVASC) 5 MG tablet TAKE 2 TABLETS BY MOUTH ONCE DAILY 180 tablet 1  . Cholecalciferol (VITAMIN D-3) 1000 units CAPS Take 1 capsule by mouth daily.    . clopidogrel (PLAVIX) 75 MG tablet TAKE 1 TABLET BY MOUTH ONCE DAILY 30 tablet 4  . Cyanocobalamin (VITAMIN B 12) 100 MCG LOZG Take 1 tablet by mouth daily.     . furosemide (LASIX) 20 MG tablet Take 1 tablet (20 mg total) by mouth daily. 90 tablet 1  . ketoconazole (NIZORAL) 2 % cream Apply to the feet and between the toes every night. 60 g 3  . levothyroxine (SYNTHROID) 88 MCG tablet TAKE 1 TABLET BY MOUTH ONCE DAILY ON AN EMPTY STOMACH. WAIT 30 MINUTES BEFORE TAKING OTHER MEDS. 90 tablet 1  . nitroGLYCERIN (NITROSTAT) 0.4 MG SL tablet Place 1 tablet (0.4 mg total) every 5 (five) minutes as needed under the tongue for chest pain. 30 tablet 2  . omeprazole (PRILOSEC) 20 MG  capsule TAKE 1 CAPSULE BY MOUTH ONCE EVERY MORNING 90 capsule 2  . Potassium 99 MG TABS Take 99 mg by mouth daily.     . RESTASIS 0.05 % ophthalmic emulsion Place 1 drop into both eyes 2 (two) times daily.     . rosuvastatin (CRESTOR) 10 MG tablet Take by mouth.    Kathryn Wise PRESERVATIVE FREE 0.4-0.3 % SOLN     . temazepam (RESTORIL) 15 MG capsule TAKE 1 CAPSULE BY MOUTH AT BEDTIME AS NEEDED SLEEP 30 capsule 5  . TURMERIC PO Take by mouth.    . ciprofloxacin (CIPRO) 250 MG tablet Take 1 tablet (250 mg total) by mouth 2 (two) times daily. 6 tablet 0  . temazepam (RESTORIL) 15 MG capsule TAKE 1 CAPSULE BY MOUTH AT BEDTIME AS NEEDED SLEEP 30 capsule 0    No facility-administered medications prior to visit.    Review of Systems;  Patient denies headache, fevers, malaise, unintentional weight loss, skin rash, eye pain, sinus congestion and sinus pain, sore throat, dysphagia,  hemoptysis , cough, dyspnea, wheezing, chest pain, palpitations, orthopnea, edema, abdominal pain, nausea, melena, diarrhea, constipation, flank pain, dysuria, hematuria, urinary  Frequency, nocturia, numbness, tingling, seizures,  Focal weakness, Loss of consciousness,  Tremor, insomnia, depression, anxiety, and suicidal ideation.      Objective:  BP (!) 160/68 (BP Location: Left Arm, Patient Position: Sitting)   Pulse 93   Temp 98 F (36.7 C)   Ht 5' 5.98" (1.676 m)   Wt 157 lb 8 oz (71.4 kg)   SpO2 91%   BMI 25.43 kg/m   BP Readings from Last 3 Encounters:  09/21/20 (!) 160/68  09/12/20 (!) 142/70  05/04/20 (!) 150/76    Wt Readings from Last 3 Encounters:  09/21/20 157 lb 8 oz (71.4 kg)  09/12/20 157 lb 6.4 oz (71.4 kg)  05/04/20 153 lb 9.6 oz (69.7 kg)    General appearance: alert, cooperative and appears stated age Ears: normal TM's and external ear canals both ears Throat: lips, mucosa, and tongue normal; teeth and gums normal Neck: no adenopathy, no carotid bruit, supple, symmetrical, trachea midline and thyroid not enlarged, symmetric, no tenderness/mass/nodules Back: symmetric, no curvature. ROM normal. No CVA tenderness. Lungs: clear to auscultation bilaterally Heart: regular rate and rhythm, S1, S2 normal, no murmur, click, rub or gallop Abdomen: soft, non-tender; bowel sounds normal; no masses,  no organomegaly Pulses: 2+ and symmetric Skin: Skin color, texture, turgor normal. No rashes or lesions Lymph nodes: Cervical, supraclavicular, and axillary nodes normal.  Lab Results  Component Value Date   HGBA1C 5.7 (A) 09/21/2020   HGBA1C 5.9 11/03/2019   HGBA1C 6.3 10/01/2017    Lab Results  Component Value Date   CREATININE 1.01  09/21/2020   CREATININE 0.91 05/04/2020   CREATININE 0.87 02/23/2020    Lab Results  Component Value Date   WBC 8.8 02/23/2020   HGB 13.2 02/23/2020   HCT 40.1 02/23/2020   PLT 188.0 02/23/2020   GLUCOSE 108 (H) 09/21/2020   CHOL 122 09/21/2020   TRIG 110.0 09/21/2020   HDL 49.60 09/21/2020   LDLDIRECT 126.0 09/19/2016   LDLCALC 51 09/21/2020   ALT 10 09/21/2020   AST 17 09/21/2020   NA 134 (L) 09/21/2020   K 4.5 09/21/2020   CL 99 09/21/2020   CREATININE 1.01 09/21/2020   BUN 19 09/21/2020   CO2 28 09/21/2020   TSH 1.67 09/21/2020   INR 1.0 08/14/2019   HGBA1C 5.7 (A) 09/21/2020  US Carotid Bilateral  Result Date: 09/08/2020 CLINICAL DATA:  Amaurosis fugax EXAM: BILATERAL CAROTID DUPLEX ULTRASOUND TECHNIQUE: Pearline Cables scale imaging, color Doppler and duplex ultrasound were performed of bilateral carotid and vertebral arteries in the neck. COMPARISON:  None. FINDINGS: Criteria: Quantification of carotid stenosis is based on velocity parameters that correlate the residual internal carotid diameter with NASCET-based stenosis levels, using the diameter of the distal internal carotid lumen as the denominator for stenosis measurement. The following velocity measurements were obtained: RIGHT ICA: 139/30 cm/sec CCA: 93/81 cm/sec SYSTOLIC ICA/CCA RATIO:  1.5 ECA:  124 cm/sec LEFT ICA: 274/114 cm/sec CCA: 82/9 cm/sec SYSTOLIC ICA/CCA RATIO:  2.0 ECA:  99 cm/sec RIGHT CAROTID ARTERY: Heterogeneous atherosclerotic plaque in the proximal internal carotid artery. By peak systolic velocity criteria, the estimated stenosis falls in the 50-69% diameter range. RIGHT VERTEBRAL ARTERY:  Patent with normal antegrade flow. LEFT CAROTID ARTERY: Heterogeneous atherosclerotic plaque in the proximal internal carotid artery. By peak systolic velocity criteria, the estimated stenosis is greater than 70%. The peak systolic velocity is significantly elevated to 274 centimeters/second. Additionally, there is spectral  broadening and high velocity jetting. LEFT VERTEBRAL ARTERY:  Patent with normal antegrade flow. IMPRESSION: 1. Severe (70-99%) stenosis proximal left internal carotid artery secondary to bulky heterogeneous atherosclerotic plaque. 2. Moderate (50-69%) stenosis proximal right internal carotid artery secondary to heterogenous atherosclerotic plaque. 3. Vertebral arteries are patent with normal antegrade flow. Signed, Criselda Peaches, MD, [facility] Electronically Signed   By: Jacqulynn Cadet M.D.   On: 09/08/2020 16:06    Assessment & Plan:   Problem List Items Addressed This Visit      Unprioritized   Tremulousness    The Etiology of her episodes of weakness/tremulousness is  unclear.  screening labs non diagnostic   Lab Results  Component Value Date   TSH 1.67 09/21/2020   Lab Results  Component Value Date   HGBA1C 5.7 (A) 09/21/2020          Hypothyroidism   Relevant Medications   metoprolol succinate (TOPROL-XL) 25 MG 24 hr tablet   Other Relevant Orders   TSH (Completed)   Hypertension    Elevated on 5 mg amlodipine alone.  Has LVH on 2018 ECHO.  Will repeat a trial of low dose metoprolol succinate.  History of RAS on right , 80% occlusion stented.       Relevant Medications   metoprolol succinate (TOPROL-XL) 25 MG 24 hr tablet   Hyperlipidemia LDL goal <130    Tolerating  rosuvastatin  For mitigation of stroke risk g given evidence of atherosclerosis in brain MRI and carotid stenosis noted to have progressed on recent carotid doppler .  LDL is 51  Lab Results  Component Value Date   CHOL 122 09/21/2020   HDL 49.60 09/21/2020   LDLCALC 51 09/21/2020   LDLDIRECT 126.0 09/19/2016   TRIG 110.0 09/21/2020   CHOLHDL 2 09/21/2020   Lab Results  Component Value Date   ALT 10 09/21/2020   AST 17 09/21/2020   ALKPHOS 59 09/21/2020   BILITOT 0.7 09/21/2020         Relevant Medications    metoprolol succinate (TOPROL-XL) 25 MG 24 hr tablet   Carotid stenosis    Taking plavi and statin       Relevant Medications   metoprolol succinate (TOPROL-XL) 25 MG 24 hr tablet   Arteriosclerotic cerebrovascular disease - Primary    Recent events were suggestive of TIA and  MRI supports this possibility.  She was advised to continue plavix and statin       Relevant Medications   metoprolol succinate (TOPROL-XL) 25 MG 24 hr tablet   Other Relevant Orders   Lipid panel (Completed)    Other Visit Diagnoses    Prediabetes       Relevant Orders   Comprehensive metabolic panel (Completed)   POCT HgB A1C (Completed)      I have discontinued Shamarie B. Stailey's ciprofloxacin. I am also having her start on metoprolol succinate. Additionally, I am having her maintain her Restasis, Vitamin B 12, Vitamin D-3, Potassium, nitroGLYCERIN, furosemide, TURMERIC PO, Systane Preservative Free, rosuvastatin, amLODipine, temazepam, levothyroxine, omeprazole, clopidogrel, and ketoconazole.  Meds ordered this encounter  Medications  . metoprolol succinate (TOPROL-XL) 25 MG 24 hr tablet    Sig: Take 1 tablet (25 mg total) by mouth daily.    Dispense:  90 tablet    Refill:  3    Medications Discontinued During This Encounter  Medication Reason  . ciprofloxacin (CIPRO) 250 MG tablet   . temazepam (RESTORIL) 15 MG capsule     I provided  30 minutes of  face-to-face time during this encounter reviewing patient's current problems and past surgeries, labs and imaging studies, providing counseling on the above mentioned problems , and coordination  of care . Follow-up: No follow-ups on file.   [name], MD

## 2020-09-22 DIAGNOSIS — R251 Tremor, unspecified: Secondary | ICD-10-CM | POA: Insufficient documentation

## 2020-09-22 LAB — COMPREHENSIVE METABOLIC PANEL WITH GFR
ALT: 10 U/L (ref 0–35)
AST: 17 U/L (ref 0–37)
Albumin: 4.1 g/dL (ref 3.5–5.2)
Alkaline Phosphatase: 59 U/L (ref 39–117)
BUN: 19 mg/dL (ref 6–23)
CO2: 28 meq/L (ref 19–32)
Calcium: 9.2 mg/dL (ref 8.4–10.5)
Chloride: 99 meq/L (ref 96–112)
Creatinine, Ser: 1.01 mg/dL (ref 0.40–1.20)
GFR: 49.72 mL/min — ABNORMAL LOW
Glucose, Bld: 108 mg/dL — ABNORMAL HIGH (ref 70–99)
Potassium: 4.5 meq/L (ref 3.5–5.1)
Sodium: 134 meq/L — ABNORMAL LOW (ref 135–145)
Total Bilirubin: 0.7 mg/dL (ref 0.2–1.2)
Total Protein: 6.9 g/dL (ref 6.0–8.3)

## 2020-09-22 LAB — LIPID PANEL
Cholesterol: 122 mg/dL (ref 0–200)
HDL: 49.6 mg/dL
LDL Cholesterol: 51 mg/dL (ref 0–99)
NonHDL: 72.6
Total CHOL/HDL Ratio: 2
Triglycerides: 110 mg/dL (ref 0.0–149.0)
VLDL: 22 mg/dL (ref 0.0–40.0)

## 2020-09-22 LAB — TSH: TSH: 1.67 u[IU]/mL (ref 0.35–4.50)

## 2020-09-22 NOTE — Assessment & Plan Note (Addendum)
Elevated on 5 mg amlodipine alone.  Has LVH on 2018 ECHO.  Will repeat a trial of low dose metoprolol succinate.  History of RAS on right , 80% occlusion stented.

## 2020-09-22 NOTE — Assessment & Plan Note (Addendum)
The Etiology of her episodes of weakness/tremulousness is  unclear.  screening labs non diagnostic   Lab Results  Component Value Date   TSH 1.67 09/21/2020   Lab Results  Component Value Date   HGBA1C 5.7 (A) 09/21/2020

## 2020-09-22 NOTE — Assessment & Plan Note (Signed)
Tolerating  rosuvastatin  For mitigation of stroke risk g given evidence of atherosclerosis in brain MRI and carotid stenosis noted to have progressed on recent carotid doppler .  LDL is 51  Lab Results  Component Value Date   CHOL 122 09/21/2020   HDL 49.60 09/21/2020   LDLCALC 51 09/21/2020   LDLDIRECT 126.0 09/19/2016   TRIG 110.0 09/21/2020   CHOLHDL 2 09/21/2020   Lab Results  Component Value Date   ALT 10 09/21/2020   AST 17 09/21/2020   ALKPHOS 59 09/21/2020   BILITOT 0.7 09/21/2020

## 2020-09-22 NOTE — Assessment & Plan Note (Signed)
Recent events were suggestive of TIA and MRI supports this possibility.  She was advised to continue plavix and statin

## 2020-09-23 ENCOUNTER — Telehealth: Payer: Self-pay | Admitting: Internal Medicine

## 2020-09-23 NOTE — Telephone Encounter (Signed)
Called and spoke to [name] and gave lab results. Kathryn Wise verbalized understanding and had no further questions.

## 2020-09-23 NOTE — Telephone Encounter (Signed)
Patient would like a call from the office about her lab results. Patient does not use MyChart.

## 2020-09-27 ENCOUNTER — Ambulatory Visit
Admission: RE | Admit: 2020-09-27 | Discharge: 2020-09-27 | Disposition: A | Discharge status: Home / Self Care | Payer: Medicare Other | Source: Ambulatory Visit | Attending: Vascular Surgery | Admitting: Vascular Surgery

## 2020-09-27 ENCOUNTER — Other Ambulatory Visit: Payer: Self-pay

## 2020-09-27 DIAGNOSIS — H547 Unspecified visual loss: Secondary | ICD-10-CM | POA: Diagnosis not present

## 2020-09-27 DIAGNOSIS — I10 Essential (primary) hypertension: Secondary | ICD-10-CM | POA: Diagnosis not present

## 2020-09-27 DIAGNOSIS — I6503 Occlusion and stenosis of bilateral vertebral arteries: Secondary | ICD-10-CM | POA: Diagnosis not present

## 2020-09-27 DIAGNOSIS — I6523 Occlusion and stenosis of bilateral carotid arteries: Secondary | ICD-10-CM | POA: Diagnosis not present

## 2020-09-27 HISTORY — DX: Systemic involvement of connective tissue, unspecified: M35.9

## 2020-09-27 MED ORDER — IOHEXOL 350 MG/ML SOLN
60.0000 mL | Freq: Once | INTRAVENOUS | Status: AC | PRN
  Administered 2020-09-27: 60 mL via INTRAVENOUS

## 2020-09-28 ENCOUNTER — Telehealth (INDEPENDENT_AMBULATORY_CARE_PROVIDER_SITE_OTHER): Payer: Self-pay

## 2020-09-28 NOTE — Telephone Encounter (Signed)
Discussed Ct results with Olivia Mackie, patient has appt for next steps tomorrow 09/29/2020

## 2020-09-28 NOTE — Telephone Encounter (Signed)
Olivia Mackie from [facility] called and wanted to give the CT results for pt's please return call.

## 2020-09-29 ENCOUNTER — Other Ambulatory Visit: Payer: Self-pay

## 2020-09-29 ENCOUNTER — Encounter (INDEPENDENT_AMBULATORY_CARE_PROVIDER_SITE_OTHER): Payer: Self-pay | Admitting: Vascular Surgery

## 2020-09-29 ENCOUNTER — Ambulatory Visit (INDEPENDENT_AMBULATORY_CARE_PROVIDER_SITE_OTHER): Payer: Medicare Other | Admitting: Vascular Surgery

## 2020-09-29 ENCOUNTER — Encounter (INDEPENDENT_AMBULATORY_CARE_PROVIDER_SITE_OTHER): Payer: Self-pay

## 2020-09-29 VITALS — BP 145/65 | Ht 65.0 in | Wt 158.0 lb

## 2020-09-29 DIAGNOSIS — I739 Peripheral vascular disease, unspecified: Secondary | ICD-10-CM | POA: Diagnosis not present

## 2020-09-29 DIAGNOSIS — I701 Atherosclerosis of renal artery: Secondary | ICD-10-CM

## 2020-09-29 DIAGNOSIS — I6522 Occlusion and stenosis of left carotid artery: Secondary | ICD-10-CM | POA: Diagnosis not present

## 2020-09-29 DIAGNOSIS — I2510 Atherosclerotic heart disease of native coronary artery without angina pectoris: Secondary | ICD-10-CM

## 2020-09-29 DIAGNOSIS — I150 Renovascular hypertension: Secondary | ICD-10-CM | POA: Diagnosis not present

## 2020-09-29 NOTE — H&P (View-Only) (Signed)
MRN : [id number]  Kathryn Wise is a 87 y.o. (Apr 02, 1930) female who presents with chief complaint of No chief complaint on file. [name]  History of Present Illness:   The patient is seen for follow up evaluation of carotid stenosis status post CT angiogram. CT scan was done 09/27/2020. Patient reports that the test went well with no problems or complications.   The patient denies interval amaurosis fugax. There is no recent or interval TIA symptoms or focal motor deficits. There is no prior documented CVA.  The patient is taking enteric-coated aspirin 81 mg daily.  There is no history of migraine headaches. There is no history of seizures.  The patient has a history of coronary artery disease, no recent episodes of angina or shortness of breath. The patient denies PAD or claudication symptoms. There is a history of hyperlipidemia which is being treated with a statin.    CT angiogram is reviewed by me personally and shows 90% stenosis consistent with calcified plaque at the origin of the left internal carotid artery.   No outpatient medications have been marked as taking for the 09/29/20 encounter (Appointment) with Delana Meyer, Dolores Lory, MD.    Past Medical History:  Diagnosis Date  . Arthritis   . Collagen vascular disease ([place])   . Dysphagia, pharyngoesophageal phase 09/10/2013   Upper GI study with barium swallow was done at  [facility] Mar 2015   No reflux seen  Small irreducible hiatal hernia  Mild changes of presbyeophagus (abnormal contractions of the esophagus that occur with aging) No strictures Normal gastric emptying  Incomplete visualization of stomach fold due to patient's inability turn    . GERD (gastroesophageal reflux disease)   . Heart murmur    has had years and years  . History of shingles Dec 2013   treated with steroids , post op from shoulder surgery  . History of squamous cell carcinoma 05/02/2016   right mid lateral pretibial  . Hypertension   . Hypothyroidism   .  Major depressive disorder, single episode 11/05/2015  . Osteoarthritis of left shoulder 07/14/2012  . Squamous cell carcinoma of skin 05/12/2016   R mid lat pretibial - other skin cancers treated by Dr. Sharlett Iles  . Squamous cell carcinoma of skin 08/22/2020   left distal medial popliteal - [facility]    Past Surgical History:  Procedure Laterality Date  . ARTHOSCOPIC ROTAOR CUFF REPAIR     LEFT  10+  YEARS    . CATARACT EXTRACTION W/ INTRAOCULAR LENS IMPLANT     RIGHT EYE  . CATARACT EXTRACTION W/PHACO Left 03/28/2016   Procedure: CATARACT EXTRACTION PHACO AND INTRAOCULAR LENS PLACEMENT (IOC);  Surgeon: Leandrew Koyanagi, MD;  Location: [place];  Service: Ophthalmology;  Laterality: Left;  TORIC  . FACELIFT    . FOOT SURGERY     rt foot   TUMOR REMOVED  . JOINT REPLACEMENT Left Dec 2013   shoulder  . LEFT HEART CATH AND CORONARY ANGIOGRAPHY N/A 06/12/2017   Procedure: LEFT HEART CATH AND CORONARY ANGIOGRAPHY;  Surgeon: Minna Merritts, MD;  Location: [place] [facility];  Service: Cardiovascular;  Laterality: N/A;  . RENAL ANGIOGRAPHY Left 01/07/2019   Procedure: RENAL ANGIOGRAPHY;  Surgeon: Katha Cabal, MD;  Location: [place] [facility];  Service: Cardiovascular;  Laterality: Left;  . RENAL ARTERY STENT    . SKIN CANCER EXCISION    . TOTAL SHOULDER ARTHROPLASTY  07/14/2012   Procedure: TOTAL SHOULDER ARTHROPLASTY;  Surgeon: Vonna Kotyk  Llana Aliment, MD;  Location: [place];  Service: Orthopedics;  Laterality: Left;    Social History Social History   Tobacco Use  . Smoking status: Former Smoker    Types: Cigarettes    Quit date: 07/30/1972    Years since quitting: 48.2  . Smokeless tobacco: Never Used  . Tobacco comment: smoked for only a few months  Vaping Use  . Vaping Use: Never used  Substance Use Topics  . Alcohol use: Yes    Comment: OCCAS WINE   . Drug use: No    Family History Family History  Problem Relation Age of Onset  . Cancer Daughter        Breast   . Breast cancer Daughter 63  . Cancer Son     Allergies  Allergen Reactions  . Alendronate Swelling    And nausea  And nausea  And nausea   . Lisinopril     Spike in BP  . Metoprolol Other (See Comments)    lethargy  . Oxycodone     Other reaction(s): Other (See Comments) Altered mental status  . Oxycontin [Oxycodone Hcl]     Altered mental status     REVIEW OF SYSTEMS (Negative unless checked)  Constitutional: [] Weight loss  [] Fever  [] Chills Cardiac: [] Chest pain   [] Chest pressure   [] Palpitations   [] Shortness of breath when laying flat   [] Shortness of breath with exertion. Vascular:  [] Pain in legs with walking   [] Pain in legs at rest  [] History of DVT   [] Phlebitis   [] Swelling in legs   [] Varicose veins   [] Non-healing ulcers Pulmonary:   [] Uses home oxygen   [] Productive cough   [] Hemoptysis   [] Wheeze  [] COPD   [] Asthma Neurologic:  [] Dizziness   [] Seizures   [] History of stroke   [x] History of TIA  [] Aphasia   [x] Vissual changes   [] Weakness or numbness in arm   [] Weakness or numbness in leg Musculoskeletal:   [] Joint swelling   [] Joint pain   [] Low back pain Hematologic:  [] Easy bruising  [] Easy bleeding   [] Hypercoagulable state   [] Anemic Gastrointestinal:  [] Diarrhea   [] Vomiting  [] Gastroesophageal reflux/heartburn   [] Difficulty swallowing. Genitourinary:  [] Chronic kidney disease   [] Difficult urination  [] Frequent urination   [] Blood in urine Skin:  [] Rashes   [] Ulcers  Psychological:  [] History of anxiety   []  History of major depression.  Physical Examination  There were no vitals filed for this visit. There is no height or weight on file to calculate BMI. Gen: WD/WN, NAD Head: [place]/AT, No temporalis wasting.  Ear/Nose/Throat: Hearing grossly intact, nares w/o erythema or drainage Eyes: PER, EOMI, sclera nonicteric.  Neck: Supple, no large masses.   Pulmonary:  Good air movement, no audible wheezing bilaterally, no use of accessory muscles.   Cardiac: RRR, no JVD Vascular: left carotid bruit Vessel Right Left  Radial Palpable Palpable  Brachial Palpable Palpable  Carotid Palpable Palpable  Gastrointestinal: Non-distended. No guarding/no peritoneal signs.  Musculoskeletal: M/S 5/5 throughout.  No deformity or atrophy.  Neurologic: CN 2-12 intact. Symmetrical.  Speech is fluent. Motor exam as listed above. Psychiatric: Judgment intact, Mood & affect appropriate for pt's clinical situation. Dermatologic: No rashes or ulcers noted.  No changes consistent with cellulitis.  CBC Lab Results  Component Value Date   WBC 8.8 02/23/2020   HGB 13.2 02/23/2020   HCT 40.1 02/23/2020   MCV 85.0 02/23/2020   PLT 188.0 02/23/2020    BMET  Component Value Date/Time   NA 134 (L) 09/21/2020 1600   NA 138 06/07/2014 0000   K 4.5 09/21/2020 1600   CL 99 09/21/2020 1600   CO2 28 09/21/2020 1600   GLUCOSE 108 (H) 09/21/2020 1600   BUN 19 09/21/2020 1600   BUN 20 06/07/2014 0000   CREATININE 1.01 09/21/2020 1600   CREATININE 1.14 (H) 12/03/2018 1417   CALCIUM 9.2 09/21/2020 1600   GFRNONAA 59 (L) 08/14/2019 1536   GFRAA >60 08/14/2019 1536   Estimated Creatinine Clearance: 39 mL/min (by C-G formula based on SCr of 1.01 mg/dL).  COAG Lab Results  Component Value Date   INR 1.0 08/14/2019   INR 1.0 11/08/2018   INR 0.98 07/09/2012    Radiology CT ANGIO NECK W OR WO CONTRAST  Result Date: 09/28/2020 CLINICAL DATA:  Episode of vision loss. Abnormal carotid ultrasound. EXAM: CT ANGIOGRAPHY NECK TECHNIQUE: Multidetector CT imaging of the neck was performed using the standard protocol during bolus administration of intravenous contrast. Multiplanar CT image reconstructions and MIPs were obtained to evaluate the vascular anatomy. Carotid stenosis measurements (when applicable) are obtained utilizing NASCET criteria, using the distal internal carotid diameter as the denominator. CONTRAST:  2mL OMNIPAQUE IOHEXOL 350 MG/ML SOLN  COMPARISON:  Carotid ultrasound September 08, 2020. FINDINGS: Aortic arch: Calcific atherosclerosis of the aorta and its branch vessels. Mildly enlarged main pulmonary artery. Right carotid system: Predominately calcific atherosclerosis at the carotid bifurcation with approximately 50% stenosis of the proximal right ICA. Left carotid system: Mixed calcific and noncalcific atherosclerosis at the carotid bifurcation with critical/near occlusive stenosis of the proximal left ICA (series 6, image 171; series 8, image 88) with the lumen measuring approximately 0.6 mm in diameter. Using the NASCET criteria this correlates with an approximately 80% stenosis, but this may be underestimated given the distal left ICA is diminutive in the neck, possibly related to poor flow. z Vertebral arteries: Bilateral vertebral artery origin stenosis with moderate stenosis of the proximal left vertebral artery. Otherwise, scattered atherosclerosis without evidence of significant stenosis. Skeleton: No acute abnormality Other neck: No mass or suspicious adenopathy. Upper chest: Visualized lung apices are clear. IMPRESSION: 1. Critical/near occlusive stenosis of the left proximal internal carotid artery with the lumen measuring approximately 0.6 mm in diameter. Using the NASCET criteria this correlates with an approximately 80% stenosis, but this may be underestimated given the distal left ICA is diminutive in the neck, possibly in part secondary to poor flow. 2. Approximately 50% stenosis of the proximal right ICA bifurcation. 3. Moderate stenosis of the proximal left vertebral artery. 4. Mildly enlarged main pulmonary artery, which can be seen with pulmonary arterial hypertension. These results will be called to the ordering clinician or representative by the Radiologist Assistant, and communication documented in the PACS or [employer]. Electronically Signed   By: Margaretha Sheffield MD   On: 09/28/2020 08:21   US Carotid  Bilateral  Result Date: 09/08/2020 CLINICAL DATA:  Amaurosis fugax EXAM: BILATERAL CAROTID DUPLEX ULTRASOUND TECHNIQUE: Pearline Cables scale imaging, color Doppler and duplex ultrasound were performed of bilateral carotid and vertebral arteries in the neck. COMPARISON:  None. FINDINGS: Criteria: Quantification of carotid stenosis is based on velocity parameters that correlate the residual internal carotid diameter with NASCET-based stenosis levels, using the diameter of the distal internal carotid lumen as the denominator for stenosis measurement. The following velocity measurements were obtained: RIGHT ICA: 139/30 cm/sec CCA: 27/25 cm/sec SYSTOLIC ICA/CCA RATIO:  1.5 ECA:  124 cm/sec LEFT ICA: 274/114 cm/sec CCA:  62/9 cm/sec SYSTOLIC ICA/CCA RATIO:  2.0 ECA:  99 cm/sec RIGHT CAROTID ARTERY: Heterogeneous atherosclerotic plaque in the proximal internal carotid artery. By peak systolic velocity criteria, the estimated stenosis falls in the 50-69% diameter range. RIGHT VERTEBRAL ARTERY:  Patent with normal antegrade flow. LEFT CAROTID ARTERY: Heterogeneous atherosclerotic plaque in the proximal internal carotid artery. By peak systolic velocity criteria, the estimated stenosis is greater than 70%. The peak systolic velocity is significantly elevated to 274 centimeters/second. Additionally, there is spectral broadening and high velocity jetting. LEFT VERTEBRAL ARTERY:  Patent with normal antegrade flow. IMPRESSION: 1. Severe (70-99%) stenosis proximal left internal carotid artery secondary to bulky heterogeneous atherosclerotic plaque. 2. Moderate (50-69%) stenosis proximal right internal carotid artery secondary to heterogenous atherosclerotic plaque. 3. Vertebral arteries are patent with normal antegrade flow. Signed, Criselda Peaches, MD, [facility] Radiology Electronically Signed   By: Jacqulynn Cadet M.D.   On: 09/08/2020 16:06     Assessment/Plan 1.  Stenosis of left carotid artery Recommend:  The patient is symptomatic with respect to the carotid stenosis.  The patient now has a lesion the is >90%.  Patient's CT angiography of the carotid arteries confirms >90% left ICA stenosis.  The anatomical considerations support stenting over surgery.  This was discussed in detail with the patient.  The risks, benefits and alternative therapies were reviewed in detail with the patient.  All questions were answered.  The patient agrees to proceed with stenting of the left carotid artery.  Continue antiplatelet therapy as prescribed. Continue management of CAD, HTN and Hyperlipidemia. Healthy heart diet, encouraged exercise at least 4 times per week.   2. PAD (peripheral artery disease) (HCC)  Recommend:  The patient has evidence of atherosclerosis of the lower extremities with claudication.  The patient does not voice lifestyle limiting changes at this point in time.  Noninvasive studies do not suggest clinically significant change.  No invasive studies, angiography or surgery at this time The patient should continue walking and begin a more formal exercise program.  The patient should continue antiplatelet therapy and aggressive treatment of the lipid abnormalities  No changes in the patient's medications at this time  The patient should continue wearing graduated compression socks 10-15 mmHg strength to control the mild edema.    3. Renal artery stenosis (HCC) Given patient's arterial disease optimal control of the patient's hypertension is important. BP is acceptable today  The patient's vital signs and previous noninvasive studies support the renal artery stenosis is not significantly increased when compared to the previous study.  No invasive studies or intervention is indicated at this time.  The patient will continue the current antihypertensive medications, no changes at this time.  The primary medical service will continue  aggressive antihypertensive therapy as per the AHA guidelines   4. Coronary artery disease involving native coronary artery of native heart without angina pectoris Continue cardiac and antihypertensive medications as already ordered and reviewed, no changes at this time.  Continue statin as ordered and reviewed, no changes at this time  Nitrates PRN for chest pain   5. Renovascular hypertension Continue antihypertensive medications as already ordered, these medications have been reviewed and there are no changes at this time.    [name], MD  09/29/2020 12:30 PM

## 2020-09-29 NOTE — Progress Notes (Signed)
MRN : [id number]  Kathryn Wise is a 87 y.o. (06-Nov-1929) female who presents with chief complaint of No chief complaint on file. [name]  History of Present Illness:   The patient is seen for follow up evaluation of carotid stenosis status post CT angiogram. CT scan was done 09/27/2020. Patient reports that the test went well with no problems or complications.   The patient denies interval amaurosis fugax. There is no recent or interval TIA symptoms or focal motor deficits. There is no prior documented CVA.  The patient is taking enteric-coated aspirin 81 mg daily.  There is no history of migraine headaches. There is no history of seizures.  The patient has a history of coronary artery disease, no recent episodes of angina or shortness of breath. The patient denies PAD or claudication symptoms. There is a history of hyperlipidemia which is being treated with a statin.    CT angiogram is reviewed by me personally and shows 90% stenosis consistent with calcified plaque at the origin of the left internal carotid artery.   No outpatient medications have been marked as taking for the 09/29/20 encounter (Appointment) with Delana Meyer, Dolores Lory, MD.    Past Medical History:  Diagnosis Date  . Arthritis   . Collagen vascular disease ([place])   . Dysphagia, pharyngoesophageal phase 09/10/2013   Upper GI study with barium swallow was done at  [facility] Mar 2015   No reflux seen  Small irreducible hiatal hernia  Mild changes of presbyeophagus (abnormal contractions of the esophagus that occur with aging) No strictures Normal gastric emptying  Incomplete visualization of stomach fold due to patient's inability turn    . GERD (gastroesophageal reflux disease)   . Heart murmur    has had years and years  . History of shingles Dec 2013   treated with steroids , post op from shoulder surgery  . History of squamous cell carcinoma 05/02/2016   right mid lateral pretibial  . Hypertension   . Hypothyroidism   .  Major depressive disorder, single episode 11/05/2015  . Osteoarthritis of left shoulder 07/14/2012  . Squamous cell carcinoma of skin 05/12/2016   R mid lat pretibial - other skin cancers treated by Dr. Sharlett Iles  . Squamous cell carcinoma of skin 08/22/2020   left distal medial popliteal - [facility]    Past Surgical History:  Procedure Laterality Date  . ARTHOSCOPIC ROTAOR CUFF REPAIR     LEFT  10+  YEARS    . CATARACT EXTRACTION W/ INTRAOCULAR LENS IMPLANT     RIGHT EYE  . CATARACT EXTRACTION W/PHACO Left 03/28/2016   Procedure: CATARACT EXTRACTION PHACO AND INTRAOCULAR LENS PLACEMENT (IOC);  Surgeon: Leandrew Koyanagi, MD;  Location: [facility];  Service: Ophthalmology;  Laterality: Left;  TORIC  . FACELIFT    . FOOT SURGERY     rt foot   TUMOR REMOVED  . JOINT REPLACEMENT Left Dec 2013   shoulder  . LEFT HEART CATH AND CORONARY ANGIOGRAPHY N/A 06/12/2017   Procedure: LEFT HEART CATH AND CORONARY ANGIOGRAPHY;  Surgeon: Minna Merritts, MD;  Location: [place] [facility];  Service: Cardiovascular;  Laterality: N/A;  . RENAL ANGIOGRAPHY Left 01/07/2019   Procedure: RENAL ANGIOGRAPHY;  Surgeon: Katha Cabal, MD;  Location: [place] [facility];  Service: Cardiovascular;  Laterality: Left;  . RENAL ARTERY STENT    . SKIN CANCER EXCISION    . TOTAL SHOULDER ARTHROPLASTY  07/14/2012   Procedure: TOTAL SHOULDER ARTHROPLASTY;  Surgeon: Vonna Kotyk  Llana Aliment, MD;  Location: [facility];  Service: Orthopedics;  Laterality: Left;    Social History Social History   Tobacco Use  . Smoking status: Former Smoker    Types: Cigarettes    Quit date: 07/30/1972    Years since quitting: 48.2  . Smokeless tobacco: Never Used  . Tobacco comment: smoked for only a few months  Vaping Use  . Vaping Use: Never used  Substance Use Topics  . Alcohol use: Yes    Comment: OCCAS WINE   . Drug use: No    Family History Family History  Problem Relation Age of Onset  . Cancer Daughter        Breast   . Breast cancer Daughter 80  . Cancer Son     Allergies  Allergen Reactions  . Alendronate Swelling    And nausea  And nausea  And nausea   . Lisinopril     Spike in BP  . Metoprolol Other (See Comments)    lethargy  . Oxycodone     Other reaction(s): Other (See Comments) Altered mental status  . Oxycontin [Oxycodone Hcl]     Altered mental status     REVIEW OF SYSTEMS (Negative unless checked)  Constitutional: [] Weight loss  [] Fever  [] Chills Cardiac: [] Chest pain   [] Chest pressure   [] Palpitations   [] Shortness of breath when laying flat   [] Shortness of breath with exertion. Vascular:  [] Pain in legs with walking   [] Pain in legs at rest  [] History of DVT   [] Phlebitis   [] Swelling in legs   [] Varicose veins   [] Non-healing ulcers Pulmonary:   [] Uses home oxygen   [] Productive cough   [] Hemoptysis   [] Wheeze  [] COPD   [] Asthma Neurologic:  [] Dizziness   [] Seizures   [] History of stroke   [x] History of TIA  [] Aphasia   [x] Vissual changes   [] Weakness or numbness in arm   [] Weakness or numbness in leg Musculoskeletal:   [] Joint swelling   [] Joint pain   [] Low back pain Hematologic:  [] Easy bruising  [] Easy bleeding   [] Hypercoagulable state   [] Anemic Gastrointestinal:  [] Diarrhea   [] Vomiting  [] Gastroesophageal reflux/heartburn   [] Difficulty swallowing. Genitourinary:  [] Chronic kidney disease   [] Difficult urination  [] Frequent urination   [] Blood in urine Skin:  [] Rashes   [] Ulcers  Psychological:  [] History of anxiety   []  History of major depression.  Physical Examination  There were no vitals filed for this visit. There is no height or weight on file to calculate BMI. Gen: WD/WN, NAD Head: [place]/AT, No temporalis wasting.  Ear/Nose/Throat: Hearing grossly intact, nares w/o erythema or drainage Eyes: PER, EOMI, sclera nonicteric.  Neck: Supple, no large masses.   Pulmonary:  Good air movement, no audible wheezing bilaterally, no use of accessory muscles.   Cardiac: RRR, no JVD Vascular: left carotid bruit Vessel Right Left  Radial Palpable Palpable  Brachial Palpable Palpable  Carotid Palpable Palpable  Gastrointestinal: Non-distended. No guarding/no peritoneal signs.  Musculoskeletal: M/S 5/5 throughout.  No deformity or atrophy.  Neurologic: CN 2-12 intact. Symmetrical.  Speech is fluent. Motor exam as listed above. Psychiatric: Judgment intact, Mood & affect appropriate for pt's clinical situation. Dermatologic: No rashes or ulcers noted.  No changes consistent with cellulitis.  CBC Lab Results  Component Value Date   WBC 8.8 02/23/2020   HGB 13.2 02/23/2020   HCT 40.1 02/23/2020   MCV 85.0 02/23/2020   PLT 188.0 02/23/2020    BMET  Component Value Date/Time   NA 134 (L) 09/21/2020 1600   NA 138 06/07/2014 0000   K 4.5 09/21/2020 1600   CL 99 09/21/2020 1600   CO2 28 09/21/2020 1600   GLUCOSE 108 (H) 09/21/2020 1600   BUN 19 09/21/2020 1600   BUN 20 06/07/2014 0000   CREATININE 1.01 09/21/2020 1600   CREATININE 1.14 (H) 12/03/2018 1417   CALCIUM 9.2 09/21/2020 1600   GFRNONAA 59 (L) 08/14/2019 1536   GFRAA >60 08/14/2019 1536   Estimated Creatinine Clearance: 39 mL/min (by C-G formula based on SCr of 1.01 mg/dL).  COAG Lab Results  Component Value Date   INR 1.0 08/14/2019   INR 1.0 11/08/2018   INR 0.98 07/09/2012    Radiology CT ANGIO NECK W OR WO CONTRAST  Result Date: 09/28/2020 CLINICAL DATA:  Episode of vision loss. Abnormal carotid ultrasound. EXAM: CT ANGIOGRAPHY NECK TECHNIQUE: Multidetector CT imaging of the neck was performed using the standard protocol during bolus administration of intravenous contrast. Multiplanar CT image reconstructions and MIPs were obtained to evaluate the vascular anatomy. Carotid stenosis measurements (when applicable) are obtained utilizing NASCET criteria, using the distal internal carotid diameter as the denominator. CONTRAST:  12mL OMNIPAQUE IOHEXOL 350 MG/ML SOLN  COMPARISON:  Carotid ultrasound September 08, 2020. FINDINGS: Aortic arch: Calcific atherosclerosis of the aorta and its branch vessels. Mildly enlarged main pulmonary artery. Right carotid system: Predominately calcific atherosclerosis at the carotid bifurcation with approximately 50% stenosis of the proximal right ICA. Left carotid system: Mixed calcific and noncalcific atherosclerosis at the carotid bifurcation with critical/near occlusive stenosis of the proximal left ICA (series 6, image 171; series 8, image 88) with the lumen measuring approximately 0.6 mm in diameter. Using the NASCET criteria this correlates with an approximately 80% stenosis, but this may be underestimated given the distal left ICA is diminutive in the neck, possibly related to poor flow. z Vertebral arteries: Bilateral vertebral artery origin stenosis with moderate stenosis of the proximal left vertebral artery. Otherwise, scattered atherosclerosis without evidence of significant stenosis. Skeleton: No acute abnormality Other neck: No mass or suspicious adenopathy. Upper chest: Visualized lung apices are clear. IMPRESSION: 1. Critical/near occlusive stenosis of the left proximal internal carotid artery with the lumen measuring approximately 0.6 mm in diameter. Using the NASCET criteria this correlates with an approximately 80% stenosis, but this may be underestimated given the distal left ICA is diminutive in the neck, possibly in part secondary to poor flow. 2. Approximately 50% stenosis of the proximal right ICA bifurcation. 3. Moderate stenosis of the proximal left vertebral artery. 4. Mildly enlarged main pulmonary artery, which can be seen with pulmonary arterial hypertension. These results will be called to the ordering clinician or representative by the Radiologist Assistant, and communication documented in the PACS or [employer]. Electronically Signed   By: Margaretha Sheffield MD   On: 09/28/2020 08:21   US Carotid  Bilateral  Result Date: 09/08/2020 CLINICAL DATA:  Amaurosis fugax EXAM: BILATERAL CAROTID DUPLEX ULTRASOUND TECHNIQUE: Pearline Cables scale imaging, color Doppler and duplex ultrasound were performed of bilateral carotid and vertebral arteries in the neck. COMPARISON:  None. FINDINGS: Criteria: Quantification of carotid stenosis is based on velocity parameters that correlate the residual internal carotid diameter with NASCET-based stenosis levels, using the diameter of the distal internal carotid lumen as the denominator for stenosis measurement. The following velocity measurements were obtained: RIGHT ICA: 139/30 cm/sec CCA: 62/56 cm/sec SYSTOLIC ICA/CCA RATIO:  1.5 ECA:  124 cm/sec LEFT ICA: 274/114 cm/sec CCA:  78/4 cm/sec SYSTOLIC ICA/CCA RATIO:  2.0 ECA:  99 cm/sec RIGHT CAROTID ARTERY: Heterogeneous atherosclerotic plaque in the proximal internal carotid artery. By peak systolic velocity criteria, the estimated stenosis falls in the 50-69% diameter range. RIGHT VERTEBRAL ARTERY:  Patent with normal antegrade flow. LEFT CAROTID ARTERY: Heterogeneous atherosclerotic plaque in the proximal internal carotid artery. By peak systolic velocity criteria, the estimated stenosis is greater than 70%. The peak systolic velocity is significantly elevated to 274 centimeters/second. Additionally, there is spectral broadening and high velocity jetting. LEFT VERTEBRAL ARTERY:  Patent with normal antegrade flow. IMPRESSION: 1. Severe (70-99%) stenosis proximal left internal carotid artery secondary to bulky heterogeneous atherosclerotic plaque. 2. Moderate (50-69%) stenosis proximal right internal carotid artery secondary to heterogenous atherosclerotic plaque. 3. Vertebral arteries are patent with normal antegrade flow. Signed, Criselda Peaches, MD, [facility] Radiology Electronically Signed   By: Jacqulynn Cadet M.D.   On: 09/08/2020 16:06     Assessment/Plan 1.  Stenosis of left carotid artery Recommend:  The patient is symptomatic with respect to the carotid stenosis.  The patient now has a lesion the is >90%.  Patient's CT angiography of the carotid arteries confirms >90% left ICA stenosis.  The anatomical considerations support stenting over surgery.  This was discussed in detail with the patient.  The risks, benefits and alternative therapies were reviewed in detail with the patient.  All questions were answered.  The patient agrees to proceed with stenting of the left carotid artery.  Continue antiplatelet therapy as prescribed. Continue management of CAD, HTN and Hyperlipidemia. Healthy heart diet, encouraged exercise at least 4 times per week.   2. PAD (peripheral artery disease) (HCC)  Recommend:  The patient has evidence of atherosclerosis of the lower extremities with claudication.  The patient does not voice lifestyle limiting changes at this point in time.  Noninvasive studies do not suggest clinically significant change.  No invasive studies, angiography or surgery at this time The patient should continue walking and begin a more formal exercise program.  The patient should continue antiplatelet therapy and aggressive treatment of the lipid abnormalities  No changes in the patient's medications at this time  The patient should continue wearing graduated compression socks 10-15 mmHg strength to control the mild edema.    3. Renal artery stenosis (HCC) Given patient's arterial disease optimal control of the patient's hypertension is important. BP is acceptable today  The patient's vital signs and previous noninvasive studies support the renal artery stenosis is not significantly increased when compared to the previous study.  No invasive studies or intervention is indicated at this time.  The patient will continue the current antihypertensive medications, no changes at this time.  The primary medical service will continue  aggressive antihypertensive therapy as per the AHA guidelines   4. Coronary artery disease involving native coronary artery of native heart without angina pectoris Continue cardiac and antihypertensive medications as already ordered and reviewed, no changes at this time.  Continue statin as ordered and reviewed, no changes at this time  Nitrates PRN for chest pain   5. Renovascular hypertension Continue antihypertensive medications as already ordered, these medications have been reviewed and there are no changes at this time.    [name], MD  09/29/2020 12:30 PM

## 2020-09-30 ENCOUNTER — Telehealth (INDEPENDENT_AMBULATORY_CARE_PROVIDER_SITE_OTHER): Payer: Self-pay

## 2020-09-30 NOTE — Telephone Encounter (Signed)
I attempted to contact the patient and was unable to leave a message. Patient requested through Mychart to be scheduled for her left carotid stent with Dr. Delana Meyer on 10/19/20. Patient has been scheduled with a 6:45 am arrival time to the [facility]. Covid testing on 10/17/20 between 8-1 pm at the [place]. Pre-procedure instructions will be mailed and I will attempt to contact the patient again.

## 2020-10-02 ENCOUNTER — Encounter (INDEPENDENT_AMBULATORY_CARE_PROVIDER_SITE_OTHER): Payer: Self-pay | Admitting: Vascular Surgery

## 2020-10-10 ENCOUNTER — Telehealth: Payer: Self-pay | Admitting: Internal Medicine

## 2020-10-10 DIAGNOSIS — I150 Renovascular hypertension: Secondary | ICD-10-CM

## 2020-10-10 NOTE — Telephone Encounter (Signed)
Patient called in stated that Kathryn Wise just can not take metoprolol succinate (TOPROL-XL) 25 MG 24 hr tablet

## 2020-10-12 MED ORDER — TELMISARTAN 20 MG PO TABS: 20.0000 mg | ORAL_TABLET | Freq: Every day | ORAL | 2 refills | Status: DC

## 2020-10-12 NOTE — Telephone Encounter (Signed)
Pt stated that she is not able to take the metoprolol. She stated that it is making her feel so weak and unable to function. Pt stated that the has tried cutting the dose in half and it didn't change the way she felt. She has now went to taking it avery other day with no change. Pt is wanting to know if she can stop it and possibly try something different.

## 2020-10-12 NOTE — Telephone Encounter (Signed)
Spoke with pt and informed her of the medication change. Pt gave a verbal understanding.   I will call pt back in the morning to schedule lab appt.

## 2020-10-12 NOTE — Assessment & Plan Note (Signed)
Did not tolerate metoprolol 25.  Trial of telmisartan 20 mg.  bmet one week after start

## 2020-10-12 NOTE — Telephone Encounter (Signed)
Certainly,  And I appreciate that she gave  the metoprolol one more try.  I have sent  The lowest dose of telmisartan to [facility].  20 mg once daily continue amlodipine 5 mg as well  Needs BMET after one week  Of treatment

## 2020-10-13 NOTE — Telephone Encounter (Signed)
Spoke with pt and scheduled her for a lab appt next week.

## 2020-10-14 ENCOUNTER — Telehealth: Payer: Self-pay | Admitting: Internal Medicine

## 2020-10-14 ENCOUNTER — Telehealth (INDEPENDENT_AMBULATORY_CARE_PROVIDER_SITE_OTHER): Payer: Self-pay

## 2020-10-14 NOTE — Telephone Encounter (Signed)
That will not disturb anything at all.  We will continue with procedure as planned.

## 2020-10-14 NOTE — Telephone Encounter (Signed)
I called the pt and made her aware of the Np's instructions. 

## 2020-10-14 NOTE — Telephone Encounter (Signed)
Pt called back and stated that she has not started the Telmisartan yet because she is having surgery on Wednesday to try and remove a blockage in an artery in her neck. Pt stated that she has tried reaching out to the surgeons office to let them know that she has been changed from metoprolol to telmisartan and make sure it is okay with them but nobody has called her back. Pt wanted to know if you thought it would be okay for her to start the telmisartan before her surgery.

## 2020-10-14 NOTE — Telephone Encounter (Signed)
Pt called and left a VM on the nurses line saying that her Metoprolol has now been changed to telmisartan and she is due to have a carotid stent placed Wednesday  She wants to know is this ok . Please advise.

## 2020-10-14 NOTE — Telephone Encounter (Signed)
Patient was returning call stated that cardio vascular office just called her

## 2020-10-14 NOTE — Telephone Encounter (Signed)
Patient called in about her medication change and need a call back because she is having surgery on wednesday

## 2020-10-14 NOTE — Telephone Encounter (Signed)
LMTCB

## 2020-10-17 ENCOUNTER — Other Ambulatory Visit: Payer: Self-pay

## 2020-10-17 ENCOUNTER — Other Ambulatory Visit
Admission: RE | Admit: 2020-10-17 | Discharge: 2020-10-17 | Disposition: A | Discharge status: Home / Self Care | Payer: Medicare Other | Source: Ambulatory Visit | Attending: Vascular Surgery | Admitting: Vascular Surgery

## 2020-10-17 DIAGNOSIS — Z01812 Encounter for preprocedural laboratory examination: Secondary | ICD-10-CM | POA: Insufficient documentation

## 2020-10-17 DIAGNOSIS — Z20822 Contact with and (suspected) exposure to covid-19: Secondary | ICD-10-CM | POA: Insufficient documentation

## 2020-10-17 LAB — SARS CORONAVIRUS 2 (TAT 6-24 HRS): SARS Coronavirus 2: NEGATIVE

## 2020-10-18 ENCOUNTER — Other Ambulatory Visit (INDEPENDENT_AMBULATORY_CARE_PROVIDER_SITE_OTHER): Payer: Self-pay | Admitting: Nurse Practitioner

## 2020-10-18 ENCOUNTER — Telehealth: Payer: Self-pay | Admitting: Internal Medicine

## 2020-10-18 ENCOUNTER — Other Ambulatory Visit (INDEPENDENT_AMBULATORY_CARE_PROVIDER_SITE_OTHER): Payer: Medicare Other

## 2020-10-18 DIAGNOSIS — I150 Renovascular hypertension: Secondary | ICD-10-CM | POA: Diagnosis not present

## 2020-10-18 NOTE — Telephone Encounter (Signed)
Spoke with Kathryn Wise when she came in to the office for her blood work today. Pt stated that she was told that she can take the telmisartan.

## 2020-10-18 NOTE — Telephone Encounter (Signed)
See previous telephone encounter.

## 2020-10-18 NOTE — Telephone Encounter (Signed)
Patient was returning call about labs results

## 2020-10-18 NOTE — Telephone Encounter (Signed)
Left message with pt's friend to have pt give [place] a call back.

## 2020-10-19 ENCOUNTER — Inpatient Hospital Stay
Admission: RE | Admit: 2020-10-19 | Discharge: 2020-10-20 | DRG: 036 | Disposition: A | Discharge status: Home / Self Care | Payer: Medicare Other | Attending: Vascular Surgery | Admitting: Vascular Surgery

## 2020-10-19 ENCOUNTER — Other Ambulatory Visit: Payer: Self-pay

## 2020-10-19 ENCOUNTER — Encounter
Admission: RE | Disposition: A | Discharge status: Home / Self Care | Payer: Self-pay | Source: Home / Self Care | Attending: Vascular Surgery

## 2020-10-19 ENCOUNTER — Encounter: Payer: Self-pay | Admitting: Vascular Surgery

## 2020-10-19 DIAGNOSIS — Z20822 Contact with and (suspected) exposure to covid-19: Secondary | ICD-10-CM | POA: Diagnosis present

## 2020-10-19 DIAGNOSIS — I6522 Occlusion and stenosis of left carotid artery: Secondary | ICD-10-CM

## 2020-10-19 DIAGNOSIS — Z7982 Long term (current) use of aspirin: Secondary | ICD-10-CM | POA: Diagnosis not present

## 2020-10-19 DIAGNOSIS — Z96612 Presence of left artificial shoulder joint: Secondary | ICD-10-CM | POA: Diagnosis present

## 2020-10-19 DIAGNOSIS — I6523 Occlusion and stenosis of bilateral carotid arteries: Principal | ICD-10-CM | POA: Diagnosis present

## 2020-10-19 DIAGNOSIS — Z885 Allergy status to narcotic agent status: Secondary | ICD-10-CM | POA: Diagnosis not present

## 2020-10-19 DIAGNOSIS — R079 Chest pain, unspecified: Secondary | ICD-10-CM | POA: Diagnosis present

## 2020-10-19 DIAGNOSIS — K219 Gastro-esophageal reflux disease without esophagitis: Secondary | ICD-10-CM | POA: Diagnosis present

## 2020-10-19 DIAGNOSIS — Z9842 Cataract extraction status, left eye: Secondary | ICD-10-CM | POA: Diagnosis not present

## 2020-10-19 DIAGNOSIS — I2510 Atherosclerotic heart disease of native coronary artery without angina pectoris: Secondary | ICD-10-CM | POA: Diagnosis present

## 2020-10-19 DIAGNOSIS — Z9841 Cataract extraction status, right eye: Secondary | ICD-10-CM

## 2020-10-19 DIAGNOSIS — E785 Hyperlipidemia, unspecified: Secondary | ICD-10-CM | POA: Diagnosis present

## 2020-10-19 DIAGNOSIS — Z888 Allergy status to other drugs, medicaments and biological substances status: Secondary | ICD-10-CM

## 2020-10-19 DIAGNOSIS — Z87891 Personal history of nicotine dependence: Secondary | ICD-10-CM | POA: Diagnosis not present

## 2020-10-19 DIAGNOSIS — I701 Atherosclerosis of renal artery: Secondary | ICD-10-CM | POA: Diagnosis present

## 2020-10-19 DIAGNOSIS — E039 Hypothyroidism, unspecified: Secondary | ICD-10-CM | POA: Diagnosis present

## 2020-10-19 DIAGNOSIS — Z961 Presence of intraocular lens: Secondary | ICD-10-CM | POA: Diagnosis present

## 2020-10-19 DIAGNOSIS — Z85828 Personal history of other malignant neoplasm of skin: Secondary | ICD-10-CM

## 2020-10-19 DIAGNOSIS — I739 Peripheral vascular disease, unspecified: Secondary | ICD-10-CM | POA: Diagnosis present

## 2020-10-19 DIAGNOSIS — I10 Essential (primary) hypertension: Secondary | ICD-10-CM | POA: Diagnosis present

## 2020-10-19 HISTORY — PX: CAROTID PTA/STENT INTERVENTION: CATH118231

## 2020-10-19 LAB — CREATININE, SERUM
Creatinine, Ser: 0.93 mg/dL (ref 0.44–1.00)
GFR, Estimated: 59 mL/min — ABNORMAL LOW (ref >60)

## 2020-10-19 LAB — BASIC METABOLIC PANEL
BUN: 14 mg/dL (ref 6–23)
CO2: 26 mEq/L (ref 19–32)
Calcium: 9.2 mg/dL (ref 8.4–10.5)
Chloride: 100 mEq/L (ref 96–112)
Creatinine, Ser: 0.94 mg/dL (ref 0.40–1.20)
GFR: 54.17 mL/min — ABNORMAL LOW (ref >60.00)
Glucose, Bld: 135 mg/dL — ABNORMAL HIGH (ref 70–99)
Potassium: 4.5 mEq/L (ref 3.5–5.1)
Sodium: 134 mEq/L — ABNORMAL LOW (ref 135–145)

## 2020-10-19 LAB — BUN: BUN: 14 mg/dL (ref 8–23)

## 2020-10-19 LAB — GLUCOSE, CAPILLARY: Glucose-Capillary: 93 mg/dL (ref 70–99)

## 2020-10-19 LAB — MRSA PCR SCREENING: MRSA by PCR: NEGATIVE

## 2020-10-19 SURGERY — CAROTID PTA/STENT INTERVENTION
Anesthesia: Moderate Sedation | Laterality: Left

## 2020-10-19 MED ORDER — ASPIRIN EC 81 MG PO TBEC
81.0000 mg | DELAYED_RELEASE_TABLET | Freq: Every day | ORAL | Status: DC
  Administered 2020-10-20: 81 mg via ORAL
  Filled 2020-10-19: qty 1

## 2020-10-19 MED ORDER — PANTOPRAZOLE SODIUM 40 MG IV SOLR
40.0000 mg | Freq: Every day | INTRAVENOUS | Status: DC
  Administered 2020-10-19: 40 mg via INTRAVENOUS
  Filled 2020-10-19: qty 40

## 2020-10-19 MED ORDER — CEFAZOLIN SODIUM-DEXTROSE 2-4 GM/100ML-% IV SOLN
INTRAVENOUS | Status: AC
  Administered 2020-10-19: 2 g via INTRAVENOUS
  Filled 2020-10-19: qty 100

## 2020-10-19 MED ORDER — KCL IN DEXTROSE-NACL 20-5-0.9 MEQ/L-%-% IV SOLN
INTRAVENOUS | Status: DC
  Filled 2020-10-19 (×3): qty 1000

## 2020-10-19 MED ORDER — HEPARIN SODIUM (PORCINE) 1000 UNIT/ML IJ SOLN
INTRAMUSCULAR | Status: AC
  Filled 2020-10-19: qty 1

## 2020-10-19 MED ORDER — MIDAZOLAM HCL 2 MG/2ML IJ SOLN
INTRAMUSCULAR | Status: DC | PRN
  Administered 2020-10-19: 0.5 mg via INTRAVENOUS
  Administered 2020-10-19: 2 mg via INTRAVENOUS

## 2020-10-19 MED ORDER — CHLORHEXIDINE GLUCONATE CLOTH 2 % EX PADS
6.0000 | MEDICATED_PAD | Freq: Every day | CUTANEOUS | Status: DC
  Administered 2020-10-20: 6 via TOPICAL

## 2020-10-19 MED ORDER — ATROPINE SULFATE 1 MG/10ML IJ SOSY
PREFILLED_SYRINGE | INTRAMUSCULAR | Status: AC
  Filled 2020-10-19: qty 10

## 2020-10-19 MED ORDER — LEVOTHYROXINE SODIUM 88 MCG PO TABS
88.0000 ug | ORAL_TABLET | Freq: Every day | ORAL | Status: DC
  Filled 2020-10-19: qty 1

## 2020-10-19 MED ORDER — MORPHINE SULFATE (PF) 4 MG/ML IV SOLN: 2.0000 mg | INTRAVENOUS | Status: DC | PRN

## 2020-10-19 MED ORDER — CLOPIDOGREL BISULFATE 75 MG PO TABS
75.0000 mg | ORAL_TABLET | Freq: Every day | ORAL | Status: DC
  Administered 2020-10-20: 75 mg via ORAL
  Filled 2020-10-19: qty 1

## 2020-10-19 MED ORDER — CYCLOSPORINE 0.05 % OP EMUL
1.0000 [drp] | Freq: Two times a day (BID) | OPHTHALMIC | Status: DC
  Administered 2020-10-19 – 2020-10-20 (×2): 1 [drp] via OPHTHALMIC
  Filled 2020-10-19 (×4): qty 1

## 2020-10-19 MED ORDER — AMLODIPINE BESYLATE 5 MG PO TABS
5.0000 mg | ORAL_TABLET | Freq: Every day | ORAL | Status: DC
Start: 2020-10-20 — End: 2020-10-19

## 2020-10-19 MED ORDER — METHYLPREDNISOLONE SODIUM SUCC 125 MG IJ SOLR: 125.0000 mg | Freq: Once | INTRAMUSCULAR | Status: DC | PRN

## 2020-10-19 MED ORDER — SODIUM CHLORIDE 0.9 % IV SOLN: INTRAVENOUS | Status: DC

## 2020-10-19 MED ORDER — PHENYLEPHRINE HCL (PRESSORS) 10 MG/ML IV SOLN
INTRAVENOUS | Status: AC
  Filled 2020-10-19: qty 1

## 2020-10-19 MED ORDER — ONDANSETRON HCL 4 MG/2ML IJ SOLN: 4.0000 mg | Freq: Four times a day (QID) | INTRAMUSCULAR | Status: DC | PRN

## 2020-10-19 MED ORDER — PHENOL 1.4 % MT LIQD
1.0000 | OROMUCOSAL | Status: DC | PRN
  Filled 2020-10-19: qty 177

## 2020-10-19 MED ORDER — DOPAMINE-DEXTROSE 3.2-5 MG/ML-% IV SOLN
INTRAVENOUS | Status: AC
  Filled 2020-10-19: qty 250

## 2020-10-19 MED ORDER — HEPARIN SODIUM (PORCINE) 1000 UNIT/ML IJ SOLN
INTRAMUSCULAR | Status: DC | PRN
  Administered 2020-10-19: 7000 [IU] via INTRAVENOUS

## 2020-10-19 MED ORDER — ROSUVASTATIN CALCIUM 10 MG PO TABS
10.0000 mg | ORAL_TABLET | Freq: Every day | ORAL | Status: DC
  Administered 2020-10-19 – 2020-10-20 (×2): 10 mg via ORAL
  Filled 2020-10-19 (×2): qty 1

## 2020-10-19 MED ORDER — SODIUM CHLORIDE 0.9 % IV SOLN: 500.0000 mL | Freq: Once | INTRAVENOUS | Status: DC | PRN

## 2020-10-19 MED ORDER — HYDRALAZINE HCL 20 MG/ML IJ SOLN: 5.0000 mg | INTRAMUSCULAR | Status: DC | PRN

## 2020-10-19 MED ORDER — FENTANYL CITRATE (PF) 100 MCG/2ML IJ SOLN: 12.5000 ug | Freq: Once | INTRAMUSCULAR | Status: DC | PRN

## 2020-10-19 MED ORDER — GUAIFENESIN-DM 100-10 MG/5ML PO SYRP: 15.0000 mL | ORAL_SOLUTION | ORAL | Status: DC | PRN

## 2020-10-19 MED ORDER — ALUM & MAG HYDROXIDE-SIMETH 200-200-20 MG/5ML PO SUSP: 15.0000 mL | ORAL | Status: DC | PRN

## 2020-10-19 MED ORDER — NITROGLYCERIN 0.4 MG SL SUBL: 0.4000 mg | SUBLINGUAL_TABLET | SUBLINGUAL | Status: DC | PRN

## 2020-10-19 MED ORDER — TRAMADOL HCL 50 MG PO TABS: 50.0000 mg | ORAL_TABLET | Freq: Four times a day (QID) | ORAL | Status: DC | PRN

## 2020-10-19 MED ORDER — CEFAZOLIN SODIUM-DEXTROSE 2-4 GM/100ML-% IV SOLN
2.0000 g | Freq: Three times a day (TID) | INTRAVENOUS | Status: AC
  Administered 2020-10-19 (×2): 2 g via INTRAVENOUS
  Filled 2020-10-19 (×3): qty 100

## 2020-10-19 MED ORDER — MIDAZOLAM HCL 2 MG/ML PO SYRP: 8.0000 mg | ORAL_SOLUTION | Freq: Once | ORAL | Status: DC | PRN

## 2020-10-19 MED ORDER — DOCUSATE SODIUM 100 MG PO CAPS
100.0000 mg | ORAL_CAPSULE | Freq: Every day | ORAL | Status: DC
  Administered 2020-10-20: 100 mg via ORAL
  Filled 2020-10-19: qty 1

## 2020-10-19 MED ORDER — CEFAZOLIN SODIUM-DEXTROSE 2-4 GM/100ML-% IV SOLN: 2.0000 g | Freq: Once | INTRAVENOUS | Status: AC

## 2020-10-19 MED ORDER — CLOPIDOGREL BISULFATE 75 MG PO TABS
150.0000 mg | ORAL_TABLET | ORAL | Status: AC
  Administered 2020-10-19: 150 mg via ORAL
  Filled 2020-10-19: qty 2

## 2020-10-19 MED ORDER — MIDAZOLAM HCL 5 MG/5ML IJ SOLN
INTRAMUSCULAR | Status: AC
  Filled 2020-10-19: qty 5

## 2020-10-19 MED ORDER — DIPHENHYDRAMINE HCL 50 MG/ML IJ SOLN: 50.0000 mg | Freq: Once | INTRAMUSCULAR | Status: DC | PRN

## 2020-10-19 MED ORDER — ACETAMINOPHEN 325 MG RE SUPP
325.0000 mg | RECTAL | Status: DC | PRN
  Filled 2020-10-19: qty 2

## 2020-10-19 MED ORDER — FAMOTIDINE 20 MG PO TABS: 40.0000 mg | ORAL_TABLET | Freq: Once | ORAL | Status: DC | PRN

## 2020-10-19 MED ORDER — LEVOTHYROXINE SODIUM 88 MCG PO TABS
88.0000 ug | ORAL_TABLET | Freq: Every day | ORAL | Status: DC
  Administered 2020-10-19: 88 ug via ORAL
  Filled 2020-10-19 (×2): qty 1

## 2020-10-19 MED ORDER — FENTANYL CITRATE (PF) 100 MCG/2ML IJ SOLN
INTRAMUSCULAR | Status: DC | PRN
  Administered 2020-10-19: 50 ug via INTRAVENOUS
  Administered 2020-10-19: 25 ug via INTRAVENOUS

## 2020-10-19 MED ORDER — ACETAMINOPHEN 325 MG PO TABS: 325.0000 mg | ORAL_TABLET | ORAL | Status: DC | PRN

## 2020-10-19 MED ORDER — FENTANYL CITRATE (PF) 100 MCG/2ML IJ SOLN
INTRAMUSCULAR | Status: AC
  Filled 2020-10-19: qty 2

## 2020-10-19 SURGICAL SUPPLY — 23 items
BALLN VIATRAC 4X20X135 (BALLOONS) ×2
BALLOON VIATRAC 4X20X135 (BALLOONS) ×1 IMPLANT
CATH ANGIO 5F PIGTAIL 100CM (CATHETERS) ×2 IMPLANT
CATH BEACON 5 .035 100 H1 TIP (CATHETERS) ×2 IMPLANT
CATH G 5FX100 (CATHETERS) ×2 IMPLANT
CATH G XP .038X100 (CATHETERS) ×2 IMPLANT
COVER PROBE U/S 5X48 (MISCELLANEOUS) ×2 IMPLANT
DEVICE EMBOSHIELD NAV6 4.0-7.0 (FILTER) ×2 IMPLANT
DEVICE SAFEGUARD 24CM (GAUZE/BANDAGES/DRESSINGS) ×2 IMPLANT
DEVICE STARCLOSE SE CLOSURE (Vascular Products) ×2 IMPLANT
DEVICE TORQUE (MISCELLANEOUS) ×2 IMPLANT
GLIDEWIRE ADV .035X260CM (WIRE) ×2 IMPLANT
GLIDEWIRE ANGLED SS 035X260CM (WIRE) ×2 IMPLANT
KIT CAROTID MANIFOLD (MISCELLANEOUS) ×2 IMPLANT
KIT ENCORE 26 ADVANTAGE (KITS) ×2 IMPLANT
NEEDLE ENTRY 21GA 7CM ECHOTIP (NEEDLE) ×2 IMPLANT
PACK ANGIOGRAPHY (CUSTOM PROCEDURE TRAY) ×2 IMPLANT
SHEATH BRITE TIP 6FRX11 (SHEATH) ×2 IMPLANT
SHEATH SHUTTLE SELECT 6F (SHEATH) ×2 IMPLANT
STENT XACT CAR 9-7X40X136 (Permanent Stent) ×2 IMPLANT
TOWEL OR 17X26 4PK STRL BLUE (TOWEL DISPOSABLE) ×2 IMPLANT
WIRE G VAS 035X260 STIFF (WIRE) ×2 IMPLANT
WIRE GUIDERIGHT .035X150 (WIRE) ×2 IMPLANT

## 2020-10-19 NOTE — OR Nursing (Signed)
MD Schnier at bedside updating pt and her daughter.

## 2020-10-19 NOTE — Plan of Care (Signed)
Pt admitted to ICU after carotid stent placement. Site oozing slowly, providers aware, monitoring/controlling with 4x4 as needed. No hematoma. Pulses distal to site intact. Neuro intact, at baseline. Daughter at bedside. Started on regular diet this evening. Straight cath done this evening for acute retention.   Problem: Health Behavior/Discharge Planning: Goal: Ability to manage health-related needs will improve Outcome: Progressing   Problem: Elimination: Goal: Will not experience complications related to bowel motility Outcome: Progressing Goal: Will not experience complications related to urinary retention Outcome: Progressing   Problem: Cardiovascular: Goal: Vascular access site(s) Level 0-1 will be maintained Outcome: Progressing

## 2020-10-19 NOTE — Interval H&P Note (Signed)
History and Physical Interval Note:  10/19/2020 8:12 AM  Kathryn Wise  has presented today for surgery, with the diagnosis of LT Carotid Stent Placement   LT Carotid Artery Stenosis   ABBOTT Rep Pt to have Covid on 3-21.  The various methods of treatment have been discussed with the patient and family. After consideration of risks, benefits and other options for treatment, the patient has consented to  Procedure(s): CAROTID PTA/STENT INTERVENTION (Left) as a surgical intervention.  The patient's history has been reviewed, patient examined, no change in status, stable for surgery.  I have reviewed the patient's chart and labs.  Questions were answered to the patient's satisfaction.     [name]

## 2020-10-19 NOTE — Op Note (Signed)
OPERATIVE NOTE DATE: 10/19/2020  PROCEDURE: 1.  Ultrasound guidance for vascular access right femoral artery 2.  Placement of a 7 x 9 x 40 exact stent with the use of the NAV-6 embolic protection device in the left internal carotid artery  PRE-OPERATIVE DIAGNOSIS: 1.  Symptomatic left carotid artery stenosis without infarction. 2.  Very high lesion inaccessible to surgery  POST-OPERATIVE DIAGNOSIS:  Same as above  SURGEON: [name]  ASSISTANT(S): Algernon Huxley, MD  ANESTHESIA: local/MCS  ESTIMATED BLOOD LOSS: 200 cc  CONTRAST: 45 cc  FLUORO TIME: 11.4 minutes  MODERATE CONSCIOUS SEDATION TIME: Continuous ECG pulse oximetry and cardiopulmonary monitoring was performed throughout the entire procedure by the interventional radiology nurse total sedation time was 1 hour 10 minutes and 1 second  FINDING(S): 1.   Greater than 95% left internal carotid artery stenosis  SPECIMEN(S):   none  INDICATIONS:   Patient is a 87 y.o. female who presents with symptomatic left internal carotid artery stenosis.  The patient has a very high lesion that is a poor surgical situation and carotid artery stenting was felt to be preferred to endarterectomy for that reason.  Risks and benefits were discussed and informed consent was obtained.   DESCRIPTION: After obtaining full informed written consent, the patient was brought back to the vascular suite and placed supine upon the table.  The patient received IV antibiotics prior to induction. Moderate conscious sedation was administered during a face to face encounter with the patient throughout the procedure with my supervision of the RN administering medicines and monitoring the patients vital signs and mental status throughout from the start of the procedure until the patient was taken to the recovery room.    After obtaining adequate sedation, the patient was prepped and draped in the standard fashion.    A first assistant is required in  order to allow for a safe and more efficient operation.  Duties include wire manipulations as well as assistance with pinning the sheath and positioning the detector for proper angle, assistance and deploying the stent in the proper position and appropriate images.  Further duties include assisting with patient positioning during the procedure.  I believe that this procedure requires a first assistant in order for it to be performed at a level in keeping with the high standards of this institution.  The right femoral artery was visualized with ultrasound and found to be widely patent. It was then accessed under direct ultrasound guidance without difficulty with a micropuncture needle. A permanent image was recorded.  A microwire was then advanced without difficulty under fluoroscopic guidance followed by a micro-sheath.  A J-wire was placed and we then placed a 6 French sheath. The patient was then heparinized and a total of 7000 units of intravenous heparin were given and an ACT was checked to confirm successful anticoagulation.   A pigtail catheter was then placed into the ascending aorta. This showed type I arch. The left common artery was then selectively cannulated without difficulty with a JB2 catheter and the catheter advanced into the mid left common carotid artery.  Cervical and cerebral carotid angiography was then performed. There were no obvious intracranial filling defects. The carotid bifurcation demonstrated a long segment greater than 95% stenosis within the internal carotid artery on the left.  I then advanced into the external carotid artery with a Glidewire and the straight glide catheter and then exchanged for the Amplatz Super Stiff wire. Over the Amplatz Super Stiff wire, a 6  [place] shuttle sheath was placed into the mid common carotid artery. I then used the NAV-6  Embolic protection device and crossed the lesion and parked this in the distal internal carotid artery at the base of the  skull.  I then selected a 9 x 7 x 40 exact stent.  However this would not cross the lesion and therefore the stent was removed and a 4 mm x 20 mm balloon was used to predilate the lesion.  Single inflation was made to 6 atm.  The stent was then reintroduced and crossed the lesion without further difficulty.  This was deployed across the lesion encompassing it in its entirety. A 4 mm x 20 length balloon was used to post dilate the stent. Only about a 10% residual stenosis was present after angioplasty. Completion angiogram showed normal intracranial filling without new defects. At this point I elected to terminate the procedure. The sheath was removed and StarClose closure device was deployed in the right femoral artery with excellent hemostatic result. The patient was taken to the recovery room in stable condition having tolerated the procedure well.  COMPLICATIONS: none  CONDITION: stable  [name] 10/19/2020 9:58 AM   This note was created with Dragon Medical transcription system. Any errors in dictation are purely unintentional.

## 2020-10-20 DIAGNOSIS — I6522 Occlusion and stenosis of left carotid artery: Secondary | ICD-10-CM | POA: Diagnosis not present

## 2020-10-20 LAB — POCT ACTIVATED CLOTTING TIME: Activated Clotting Time: 339 seconds

## 2020-10-20 LAB — CBC
HCT: 29.5 % — ABNORMAL LOW (ref 36.0–46.0)
Hemoglobin: 9.3 g/dL — ABNORMAL LOW (ref 12.0–15.0)
MCH: 27.5 pg (ref 26.0–34.0)
MCHC: 31.5 g/dL (ref 30.0–36.0)
MCV: 87.3 fL (ref 80.0–100.0)
Platelets: 129 10*3/uL — ABNORMAL LOW (ref 150–400)
RBC: 3.38 MIL/uL — ABNORMAL LOW (ref 3.87–5.11)
RDW: 15 % (ref 11.5–15.5)
WBC: 6.1 10*3/uL (ref 4.0–10.5)
nRBC: 0 % (ref 0.0–0.2)

## 2020-10-20 LAB — BASIC METABOLIC PANEL
Anion gap: 4 — ABNORMAL LOW (ref 5–15)
BUN: 11 mg/dL (ref 8–23)
CO2: 23 mmol/L (ref 22–32)
Calcium: 8.1 mg/dL — ABNORMAL LOW (ref 8.9–10.3)
Chloride: 107 mmol/L (ref 98–111)
Creatinine, Ser: 0.93 mg/dL (ref 0.44–1.00)
GFR, Estimated: 59 mL/min — ABNORMAL LOW (ref >60)
Glucose, Bld: 115 mg/dL — ABNORMAL HIGH (ref 70–99)
Potassium: 4.2 mmol/L (ref 3.5–5.1)
Sodium: 134 mmol/L — ABNORMAL LOW (ref 135–145)

## 2020-10-20 MED ORDER — ASPIRIN EC 81 MG PO TBEC: 81.0000 mg | DELAYED_RELEASE_TABLET | Freq: Every day | ORAL | 3 refills | Status: DC

## 2020-10-20 MED ORDER — CLOPIDOGREL BISULFATE 75 MG PO TABS: 75.0000 mg | ORAL_TABLET | Freq: Every day | ORAL | 3 refills | Status: DC

## 2020-10-20 NOTE — Plan of Care (Signed)
Pt meets all criteria for discharge. IVs removed, pt has voided and post void residual is acceptable. All instructions provided and pt is planning to follow up with PCP regarding reassessing BP meds. Also will call to schedule 1 month follow up with Dr.Schnier. Daughter at bedside to take home.   Problem: Health Behavior/Discharge Planning: Goal: Ability to manage health-related needs will improve Outcome: Completed/Met   Problem: Elimination: Goal: Will not experience complications related to bowel motility Outcome: Completed/Met Goal: Will not experience complications related to urinary retention Outcome: Completed/Met   Problem: Cardiovascular: Goal: Vascular access site(s) Level 0-1 will be maintained Outcome: Completed/Met

## 2020-10-20 NOTE — Progress Notes (Signed)
R groin site continued to ooze overnight. PAD removed and surgicel placed with gauze and tegaderm in effort to promote clotting. Site without hematoma, pulses intact, neuro exam without deficit, VSS.

## 2020-10-20 NOTE — Discharge Instructions (Signed)
Vascular Surgery Discharge Instructions:  1) You may shower as of tomorrow. Keep your groins clean and dry. Gently clean your groin with soap and water. Gently pat dry. 2) Please do not engage in strenuous activity or lifting greater than 10 pounds until you are cleared at your first postoperative follow-up.

## 2020-10-20 NOTE — Progress Notes (Signed)
During change of shift report, it was reported to this RN that the site is oozing bright red blood with clots around the PAD and that MD has been notified and is aware. This continued throughout my shift- PAD changed once at beginning of shift by charge nurse and gauze pads applied around PAD to absorb excess drainage/blood. Gauze pads changed 3 times throughout shift. MD notified again and made aware of the clots and bright red blood. Will continue to monitor

## 2020-10-20 NOTE — Discharge Summary (Signed)
[facility]    Discharge Summary  Patient ID:  Kathryn Wise MRN: [id number] DOB/AGE: June 30, 1930 87 y.o.  Admit date: 10/19/2020 Discharge date: 10/20/2020 Date of Surgery: 10/19/2020 Surgeon: Surgeon(s): Schnier, Dolores Lory, MD Algernon Huxley, MD  Admission Diagnosis: Carotid stenosis, symptomatic w/o infarct, bilateral [I65.23]  Discharge Diagnoses:  Carotid stenosis, symptomatic w/o infarct, bilateral [I65.23]  Secondary Diagnoses: Past Medical History:  Diagnosis Date  . Arthritis   . Collagen vascular disease ([place])   . Dysphagia, pharyngoesophageal phase 09/10/2013   Upper GI study with barium swallow was done at  [facility] Mar 2015   No reflux seen  Small irreducible hiatal hernia  Mild changes of presbyeophagus (abnormal contractions of the esophagus that occur with aging) No strictures Normal gastric emptying  Incomplete visualization of stomach fold due to patient's inability turn    . GERD (gastroesophageal reflux disease)   . Heart murmur    has had years and years  . History of shingles Dec 2013   treated with steroids , post op from shoulder surgery  . History of squamous cell carcinoma 05/02/2016   right mid lateral pretibial  . Hypertension   . Hypothyroidism   . Major depressive disorder, single episode 11/05/2015  . Osteoarthritis of left shoulder 07/14/2012  . Squamous cell carcinoma of skin 05/12/2016   R mid lat pretibial - other skin cancers treated by Dr. Sharlett Iles  . Squamous cell carcinoma of skin 08/22/2020   left distal medial popliteal - [facility]   Procedure(s): 1.  Ultrasound guidance for vascular access right femoral artery 2.  Placement of a 7 x 9 x 40 exact stent with the use of the NAV-6 embolic protection device in the left internal carotid artery  Discharged Condition: Good  HPI / Hospital Course:  Patient is a 87 year old female who presents with symptomatic left internal carotid artery stenosis.  The patient has a very high  lesion that is a poor surgical situation and carotid artery stenting was felt to be preferred to endarterectomy for that reason. On 10/19/20, the patient underwent:  3.  Ultrasound guidance for vascular access right femoral artery 4.  Placement of a 7 x 9 x 40 exact stent with the use of the NAV-6 embolic protection device in the left internal carotid artery  The patient tolerated the procedure well was transferred from the angiography suite to the ICU for observation overnight.  Patient's night of procedure was unremarkable.  During her brief stay, her diet was advanced, she was urinating independently, her pain was controlled with use of p.o. pain medication she was ambulating at baseline.  Day of discharge, the patient was afebrile with stable vital signs and essentially unremarkable physical exam.  Physical exam:  Alert and oriented x3, no acute distress Face: Symmetrical, tongue midline Neck: Trachea is midline.  No swelling or ecchymosis noted. Cardiovascular: Regular rate and rhythm Pulmonary: Clear to auscultation bilaterally Abdomen: Soft, nontender, nondistended, positive bowel sounds Right groin:  Access site: PAD removed.  Clean dry and intact. Extremity: Bilaterally warm distally toes Neuro: Intact.  No deficits noted on exam.  5/out of 5 upper and lower extremity  Labs: As below  Complications: None  Consults: None  Significant Diagnostic Studies: CBC Lab Results  Component Value Date   WBC 6.1 10/20/2020   HGB 9.3 (L) 10/20/2020   HCT 29.5 (L) 10/20/2020   MCV 87.3 10/20/2020   PLT 129 (L) 10/20/2020   BMET    Component  Value Date/Time   NA 134 (L) 10/20/2020 0409   NA 138 06/07/2014 0000   K 4.2 10/20/2020 0409   CL 107 10/20/2020 0409   CO2 23 10/20/2020 0409   GLUCOSE 115 (H) 10/20/2020 0409   BUN 11 10/20/2020 0409   BUN 20 06/07/2014 0000   CREATININE 0.93 10/20/2020 0409   CREATININE 1.14 (H) 12/03/2018 1417   CALCIUM 8.1 (L) 10/20/2020 0409    GFRNONAA 59 (L) 10/20/2020 0409   GFRAA >60 08/14/2019 1536   COAG Lab Results  Component Value Date   INR 1.0 08/14/2019   INR 1.0 11/08/2018   INR 0.98 07/09/2012   Disposition:  Discharge to :Home  Allergies as of 10/20/2020      Reactions   Alendronate Swelling   And nausea    Lisinopril    Spike in BP   Metoprolol Other (See Comments)   Lethargy - PT CURRENTLY TAKING    Oxycodone    Other reaction(s): Other (See Comments) Altered mental status   Oxycontin [oxycodone Hcl]    Altered mental status      Medication List    TAKE these medications   amLODipine 5 MG tablet Commonly known as: NORVASC TAKE 2 TABLETS BY MOUTH ONCE DAILY What changed: how much to take   aspirin EC 81 MG tablet Take 1 tablet (81 mg total) by mouth daily. Swallow whole.   b complex vitamins capsule Take 1 capsule by mouth daily.   clopidogrel 75 MG tablet Commonly known as: PLAVIX Take 1 tablet (75 mg total) by mouth daily.   furosemide 20 MG tablet Commonly known as: LASIX Take 1 tablet (20 mg total) by mouth daily.   ketoconazole 2 % cream Commonly known as: NIZORAL Apply to the feet and between the toes every night. What changed:   how much to take  how to take this  when to take this   levothyroxine 88 MCG tablet Commonly known as: SYNTHROID TAKE 1 TABLET BY MOUTH ONCE DAILY ON AN EMPTY STOMACH. WAIT 30 MINUTES BEFORE TAKING OTHER MEDS. What changed: See the new instructions.   nitroGLYCERIN 0.4 MG SL tablet Commonly known as: NITROSTAT Place 1 tablet (0.4 mg total) every 5 (five) minutes as needed under the tongue for chest pain.   omeprazole 20 MG capsule Commonly known as: PRILOSEC TAKE 1 CAPSULE BY MOUTH ONCE EVERY MORNING What changed: See the new instructions.   Potassium 99 MG Tabs Take 99 mg by mouth daily.   Restasis 0.05 % ophthalmic emulsion Generic drug: cycloSPORINE Place 1 drop into both eyes 2 (two) times daily.   rosuvastatin 10 MG  tablet Commonly known as: CRESTOR Take 10 mg by mouth daily.   Systane Preservative Free 0.4-0.3 % Soln Generic drug: Polyethyl Glyc-Propyl Glyc PF Place 1 drop into both eyes 4 (four) times daily as needed (dry eyes).   telmisartan 20 MG tablet Commonly known as: MICARDIS Take 1 tablet (20 mg total) by mouth daily.   temazepam 15 MG capsule Commonly known as: RESTORIL TAKE 1 CAPSULE BY MOUTH AT BEDTIME AS NEEDED SLEEP What changed: See the new instructions.   Turmeric 500 MG Caps Take 500 mg by mouth daily.   VITAMIN B 12 PO Take 1,000 mcg by mouth daily.   Vitamin D-3 25 MCG (1000 UT) Caps Take 1,000 Units by mouth daily.      Verbal and written Discharge instructions given to the patient. Wound care per Discharge AVS  Follow-up Information  Schnier, Dolores Lory, MD Follow up in 1 month(s).   Specialties: Vascular Surgery, Cardiology, Radiology, Vascular Surgery Why: Can see Schnier found.  Will need carotid duplex with visit. Contact information: [place] [address] [phone]              Signed: Sela Hua, PA-C  10/20/2020, 1:06 PM

## 2020-10-21 ENCOUNTER — Emergency Department: Payer: Medicare Other

## 2020-10-21 ENCOUNTER — Encounter: Payer: Self-pay | Admitting: Emergency Medicine

## 2020-10-21 ENCOUNTER — Telehealth: Payer: Medicare Other | Admitting: Nurse Practitioner

## 2020-10-21 ENCOUNTER — Encounter (INDEPENDENT_AMBULATORY_CARE_PROVIDER_SITE_OTHER): Payer: Self-pay

## 2020-10-21 ENCOUNTER — Encounter: Payer: Self-pay | Admitting: Nurse Practitioner

## 2020-10-21 ENCOUNTER — Inpatient Hospital Stay: Payer: Medicare Other

## 2020-10-21 ENCOUNTER — Inpatient Hospital Stay
Admission: EM | Admit: 2020-10-21 | Discharge: 2020-10-24 | DRG: 189 | Disposition: A | Discharge status: Home / Self Care | Payer: Medicare Other | Source: Ambulatory Visit | Attending: Internal Medicine | Admitting: Internal Medicine

## 2020-10-21 ENCOUNTER — Other Ambulatory Visit: Payer: Self-pay

## 2020-10-21 DIAGNOSIS — I739 Peripheral vascular disease, unspecified: Secondary | ICD-10-CM | POA: Diagnosis present

## 2020-10-21 DIAGNOSIS — R1314 Dysphagia, pharyngoesophageal phase: Secondary | ICD-10-CM | POA: Diagnosis present

## 2020-10-21 DIAGNOSIS — Z888 Allergy status to other drugs, medicaments and biological substances status: Secondary | ICD-10-CM

## 2020-10-21 DIAGNOSIS — Z96612 Presence of left artificial shoulder joint: Secondary | ICD-10-CM | POA: Diagnosis present

## 2020-10-21 DIAGNOSIS — I2510 Atherosclerotic heart disease of native coronary artery without angina pectoris: Secondary | ICD-10-CM | POA: Diagnosis present

## 2020-10-21 DIAGNOSIS — E871 Hypo-osmolality and hyponatremia: Secondary | ICD-10-CM | POA: Diagnosis present

## 2020-10-21 DIAGNOSIS — Z885 Allergy status to narcotic agent status: Secondary | ICD-10-CM

## 2020-10-21 DIAGNOSIS — E222 Syndrome of inappropriate secretion of antidiuretic hormone: Secondary | ICD-10-CM | POA: Diagnosis present

## 2020-10-21 DIAGNOSIS — J690 Pneumonitis due to inhalation of food and vomit: Secondary | ICD-10-CM | POA: Diagnosis present

## 2020-10-21 DIAGNOSIS — J189 Pneumonia, unspecified organism: Secondary | ICD-10-CM | POA: Diagnosis not present

## 2020-10-21 DIAGNOSIS — I447 Left bundle-branch block, unspecified: Secondary | ICD-10-CM | POA: Diagnosis present

## 2020-10-21 DIAGNOSIS — I672 Cerebral atherosclerosis: Secondary | ICD-10-CM | POA: Diagnosis present

## 2020-10-21 DIAGNOSIS — M1990 Unspecified osteoarthritis, unspecified site: Secondary | ICD-10-CM | POA: Diagnosis present

## 2020-10-21 DIAGNOSIS — I248 Other forms of acute ischemic heart disease: Secondary | ICD-10-CM | POA: Diagnosis present

## 2020-10-21 DIAGNOSIS — J9601 Acute respiratory failure with hypoxia: Principal | ICD-10-CM

## 2020-10-21 DIAGNOSIS — D696 Thrombocytopenia, unspecified: Secondary | ICD-10-CM | POA: Diagnosis present

## 2020-10-21 DIAGNOSIS — I5031 Acute diastolic (congestive) heart failure: Secondary | ICD-10-CM | POA: Diagnosis not present

## 2020-10-21 DIAGNOSIS — Z8673 Personal history of transient ischemic attack (TIA), and cerebral infarction without residual deficits: Secondary | ICD-10-CM | POA: Diagnosis not present

## 2020-10-21 DIAGNOSIS — K219 Gastro-esophageal reflux disease without esophagitis: Secondary | ICD-10-CM | POA: Diagnosis present

## 2020-10-21 DIAGNOSIS — R059 Cough, unspecified: Secondary | ICD-10-CM

## 2020-10-21 DIAGNOSIS — Z85828 Personal history of other malignant neoplasm of skin: Secondary | ICD-10-CM | POA: Diagnosis not present

## 2020-10-21 DIAGNOSIS — Z9114 Patient's other noncompliance with medication regimen: Secondary | ICD-10-CM

## 2020-10-21 DIAGNOSIS — M353 Polymyalgia rheumatica: Secondary | ICD-10-CM | POA: Diagnosis present

## 2020-10-21 DIAGNOSIS — I517 Cardiomegaly: Secondary | ICD-10-CM | POA: Diagnosis not present

## 2020-10-21 DIAGNOSIS — Z95828 Presence of other vascular implants and grafts: Secondary | ICD-10-CM

## 2020-10-21 DIAGNOSIS — I5033 Acute on chronic diastolic (congestive) heart failure: Secondary | ICD-10-CM | POA: Diagnosis present

## 2020-10-21 DIAGNOSIS — Z7902 Long term (current) use of antithrombotics/antiplatelets: Secondary | ICD-10-CM

## 2020-10-21 DIAGNOSIS — R778 Other specified abnormalities of plasma proteins: Secondary | ICD-10-CM | POA: Diagnosis present

## 2020-10-21 DIAGNOSIS — E039 Hypothyroidism, unspecified: Secondary | ICD-10-CM | POA: Diagnosis present

## 2020-10-21 DIAGNOSIS — F325 Major depressive disorder, single episode, in full remission: Secondary | ICD-10-CM | POA: Diagnosis present

## 2020-10-21 DIAGNOSIS — I509 Heart failure, unspecified: Secondary | ICD-10-CM

## 2020-10-21 DIAGNOSIS — I701 Atherosclerosis of renal artery: Secondary | ICD-10-CM | POA: Diagnosis not present

## 2020-10-21 DIAGNOSIS — I10 Essential (primary) hypertension: Secondary | ICD-10-CM | POA: Diagnosis not present

## 2020-10-21 DIAGNOSIS — Z79890 Hormone replacement therapy (postmenopausal): Secondary | ICD-10-CM

## 2020-10-21 DIAGNOSIS — K449 Diaphragmatic hernia without obstruction or gangrene: Secondary | ICD-10-CM | POA: Diagnosis not present

## 2020-10-21 DIAGNOSIS — R7981 Abnormal blood-gas level: Secondary | ICD-10-CM | POA: Diagnosis not present

## 2020-10-21 DIAGNOSIS — E785 Hyperlipidemia, unspecified: Secondary | ICD-10-CM | POA: Diagnosis present

## 2020-10-21 DIAGNOSIS — I110 Hypertensive heart disease with heart failure: Secondary | ICD-10-CM | POA: Diagnosis present

## 2020-10-21 DIAGNOSIS — Z79899 Other long term (current) drug therapy: Secondary | ICD-10-CM

## 2020-10-21 DIAGNOSIS — R0602 Shortness of breath: Secondary | ICD-10-CM | POA: Diagnosis not present

## 2020-10-21 DIAGNOSIS — J90 Pleural effusion, not elsewhere classified: Secondary | ICD-10-CM | POA: Diagnosis not present

## 2020-10-21 DIAGNOSIS — Z7982 Long term (current) use of aspirin: Secondary | ICD-10-CM

## 2020-10-21 DIAGNOSIS — R7989 Other specified abnormal findings of blood chemistry: Secondary | ICD-10-CM | POA: Diagnosis present

## 2020-10-21 DIAGNOSIS — Z803 Family history of malignant neoplasm of breast: Secondary | ICD-10-CM

## 2020-10-21 DIAGNOSIS — Z20822 Contact with and (suspected) exposure to covid-19: Secondary | ICD-10-CM | POA: Diagnosis present

## 2020-10-21 DIAGNOSIS — Z87891 Personal history of nicotine dependence: Secondary | ICD-10-CM

## 2020-10-21 HISTORY — DX: Pneumonia, unspecified organism: J18.9

## 2020-10-21 LAB — CBC WITH DIFFERENTIAL/PLATELET
Abs Immature Granulocytes: 0.07 10*3/uL (ref 0.00–0.07)
Basophils Absolute: 0 10*3/uL (ref 0.0–0.1)
Basophils Relative: 0 %
Eosinophils Absolute: 0 10*3/uL (ref 0.0–0.5)
Eosinophils Relative: 0 %
HCT: 29.6 % — ABNORMAL LOW (ref 36.0–46.0)
Hemoglobin: 9.7 g/dL — ABNORMAL LOW (ref 12.0–15.0)
Immature Granulocytes: 1 %
Lymphocytes Relative: 11 %
Lymphs Abs: 1.5 10*3/uL (ref 0.7–4.0)
MCH: 28.1 pg (ref 26.0–34.0)
MCHC: 32.8 g/dL (ref 30.0–36.0)
MCV: 85.8 fL (ref 80.0–100.0)
Monocytes Absolute: 0.9 10*3/uL (ref 0.1–1.0)
Monocytes Relative: 7 %
Neutro Abs: 10.8 10*3/uL — ABNORMAL HIGH (ref 1.7–7.7)
Neutrophils Relative %: 81 %
Platelets: 141 10*3/uL — ABNORMAL LOW (ref 150–400)
RBC: 3.45 MIL/uL — ABNORMAL LOW (ref 3.87–5.11)
RDW: 15 % (ref 11.5–15.5)
WBC: 13.3 10*3/uL — ABNORMAL HIGH (ref 4.0–10.5)
nRBC: 0 % (ref 0.0–0.2)

## 2020-10-21 LAB — LACTIC ACID, PLASMA: Lactic Acid, Venous: 1.6 mmol/L (ref 0.5–1.9)

## 2020-10-21 LAB — COMPREHENSIVE METABOLIC PANEL
ALT: 12 U/L (ref 0–44)
AST: 29 U/L (ref 15–41)
Albumin: 3.6 g/dL (ref 3.5–5.0)
Alkaline Phosphatase: 38 U/L (ref 38–126)
Anion gap: 8 (ref 5–15)
BUN: 14 mg/dL (ref 8–23)
CO2: 20 mmol/L — ABNORMAL LOW (ref 22–32)
Calcium: 8.6 mg/dL — ABNORMAL LOW (ref 8.9–10.3)
Chloride: 100 mmol/L (ref 98–111)
Creatinine, Ser: 0.89 mg/dL (ref 0.44–1.00)
GFR, Estimated: >60 mL/min (ref >60)
Glucose, Bld: 177 mg/dL — ABNORMAL HIGH (ref 70–99)
Potassium: 4 mmol/L (ref 3.5–5.1)
Sodium: 128 mmol/L — ABNORMAL LOW (ref 135–145)
Total Bilirubin: 1.2 mg/dL (ref 0.3–1.2)
Total Protein: 6.6 g/dL (ref 6.5–8.1)

## 2020-10-21 LAB — RESP PANEL BY RT-PCR (FLU A&B, COVID) ARPGX2
Influenza A by PCR: NEGATIVE
Influenza B by PCR: NEGATIVE
SARS Coronavirus 2 by RT PCR: NEGATIVE

## 2020-10-21 LAB — PROTIME-INR
INR: 1.1 (ref 0.8–1.2)
Prothrombin Time: 14 seconds (ref 11.4–15.2)

## 2020-10-21 LAB — APTT: aPTT: 33 seconds (ref 24–36)

## 2020-10-21 LAB — TROPONIN I (HIGH SENSITIVITY): Troponin I (High Sensitivity): 976 ng/L (ref <18)

## 2020-10-21 LAB — BRAIN NATRIURETIC PEPTIDE: B Natriuretic Peptide: 966.7 pg/mL — ABNORMAL HIGH (ref 0.0–100.0)

## 2020-10-21 MED ORDER — SODIUM CHLORIDE 0.9% FLUSH
3.0000 mL | Freq: Two times a day (BID) | INTRAVENOUS | Status: DC
  Administered 2020-10-21 – 2020-10-24 (×6): 3 mL via INTRAVENOUS

## 2020-10-21 MED ORDER — CLOPIDOGREL BISULFATE 75 MG PO TABS
75.0000 mg | ORAL_TABLET | Freq: Every day | ORAL | Status: DC
  Administered 2020-10-22 – 2020-10-24 (×3): 75 mg via ORAL
  Filled 2020-10-21 (×3): qty 1

## 2020-10-21 MED ORDER — TEMAZEPAM 7.5 MG PO CAPS
15.0000 mg | ORAL_CAPSULE | Freq: Every day | ORAL | Status: DC
  Administered 2020-10-22 – 2020-10-23 (×3): 15 mg via ORAL
  Filled 2020-10-21 (×3): qty 2

## 2020-10-21 MED ORDER — SODIUM CHLORIDE 0.9 % IV SOLN
1.0000 g | INTRAVENOUS | Status: DC
  Administered 2020-10-22: 1 g via INTRAVENOUS
  Filled 2020-10-21: qty 10
  Filled 2020-10-21: qty 1

## 2020-10-21 MED ORDER — SODIUM CHLORIDE 0.9 % IV SOLN
500.0000 mg | Freq: Once | INTRAVENOUS | Status: AC
  Administered 2020-10-21: 500 mg via INTRAVENOUS
  Filled 2020-10-21: qty 500

## 2020-10-21 MED ORDER — HEPARIN (PORCINE) 25000 UT/250ML-% IV SOLN
900.0000 [IU]/h | INTRAVENOUS | Status: DC
  Administered 2020-10-21: 900 [IU]/h via INTRAVENOUS
  Filled 2020-10-21 (×3): qty 250

## 2020-10-21 MED ORDER — SODIUM CHLORIDE 0.9% FLUSH: 3.0000 mL | INTRAVENOUS | Status: DC | PRN

## 2020-10-21 MED ORDER — LEVOTHYROXINE SODIUM 88 MCG PO TABS: 88.0000 ug | ORAL_TABLET | Freq: Every day | ORAL | Status: DC

## 2020-10-21 MED ORDER — FUROSEMIDE 10 MG/ML IJ SOLN
40.0000 mg | Freq: Once | INTRAMUSCULAR | Status: AC
  Administered 2020-10-21: 40 mg via INTRAVENOUS
  Filled 2020-10-21: qty 4

## 2020-10-21 MED ORDER — ENOXAPARIN SODIUM 40 MG/0.4ML ~~LOC~~ SOLN: 40.0000 mg | SUBCUTANEOUS | Status: DC

## 2020-10-21 MED ORDER — ACETAMINOPHEN 325 MG PO TABS: 650.0000 mg | ORAL_TABLET | ORAL | Status: DC | PRN

## 2020-10-21 MED ORDER — SODIUM CHLORIDE 0.9 % IV SOLN
250.0000 mL | INTRAVENOUS | Status: DC | PRN
  Administered 2020-10-23: 250 mL via INTRAVENOUS

## 2020-10-21 MED ORDER — HEPARIN SODIUM (PORCINE) 5000 UNIT/ML IJ SOLN
4000.0000 [IU] | Freq: Once | INTRAMUSCULAR | Status: AC
  Administered 2020-10-21: 4000 [IU] via INTRAVENOUS
  Filled 2020-10-21: qty 1

## 2020-10-21 MED ORDER — SODIUM CHLORIDE 0.9 % IV SOLN
1.0000 g | Freq: Once | INTRAVENOUS | Status: AC
  Administered 2020-10-21: 1 g via INTRAVENOUS
  Filled 2020-10-21: qty 10

## 2020-10-21 MED ORDER — SODIUM CHLORIDE 0.9 % IV SOLN
500.0000 mg | INTRAVENOUS | Status: DC
  Administered 2020-10-22: 500 mg via INTRAVENOUS
  Filled 2020-10-21 (×2): qty 500

## 2020-10-21 MED ORDER — ONDANSETRON HCL 4 MG/2ML IJ SOLN: 4.0000 mg | Freq: Four times a day (QID) | INTRAMUSCULAR | Status: DC | PRN

## 2020-10-21 MED ORDER — FUROSEMIDE 10 MG/ML IJ SOLN
40.0000 mg | Freq: Two times a day (BID) | INTRAMUSCULAR | Status: DC
  Administered 2020-10-22 – 2020-10-23 (×3): 40 mg via INTRAVENOUS
  Filled 2020-10-21 (×3): qty 4

## 2020-10-21 NOTE — Consult Note (Signed)
ANTICOAGULATION CONSULT NOTE - Initial Consult  Pharmacy Consult for heparin Indication: chest pain/ACS  Allergies  Allergen Reactions   Alendronate Swelling    And nausea     Lisinopril     Spike in BP   Metoprolol Other (See Comments)    Lethargy - PT CURRENTLY TAKING    Oxycodone     Other reaction(s): Other (See Comments) Altered mental status   Oxycontin [Oxycodone Hcl]     Altered mental status    Patient Measurements: Height: 5\' 5"  (165.1 cm) Weight: 73.5 kg (162 lb) IBW/kg (Calculated) : 57 Heparin Dosing Weight: 73.5  Vital Signs: Temp: 99.3 F (37.4 C) (03/25 1808) Temp Source: Oral (03/25 1808) BP: 117/54 (03/25 1900) Pulse Rate: 77 (03/25 1900)  Labs: Recent Labs    10/19/20 0819 10/20/20 0409 10/21/20 1737  HGB  --  9.3* 9.7*  HCT  --  29.5* 29.6*  PLT  --  129* 141*  CREATININE 0.93 0.93 0.89  TROPONINIHS  --   --  976*    Estimated Creatinine Clearance: 43.9 mL/min (by C-G formula based on SCr of 0.89 mg/dL).   Medical History: Past Medical History:  Diagnosis Date   Arthritis    Collagen vascular disease ([place])    Dysphagia, pharyngoesophageal phase 09/10/2013   Upper GI study with barium swallow was done at  [facility] Mar 2015   No reflux seen  Small irreducible hiatal hernia  Mild changes of presbyeophagus (abnormal contractions of the esophagus that occur with aging) No strictures Normal gastric emptying  Incomplete visualization of stomach fold due to patient's inability turn     GERD (gastroesophageal reflux disease)    Heart murmur    has had years and years   History of shingles Dec 2013   treated with steroids , post op from shoulder surgery   History of squamous cell carcinoma 05/02/2016   right mid lateral pretibial   Hypertension    Hypothyroidism    Major depressive disorder, single episode 11/05/2015   Osteoarthritis of left shoulder 07/14/2012   Squamous cell carcinoma of skin 05/12/2016   R mid lat pretibial -  other skin cancers treated by Dr. Sharlett Iles   Squamous cell carcinoma of skin 08/22/2020   left distal medial popliteal - [facility]    Medications:  No anticoagulants prior to admission per chart review  Assessment: 87 yo F presented with new onset SOB with history of cough x 3 weeks. PMH includes GERD, HTN, hypothyroidism, and MDD. Pharmacy has been consulted to dose heparin for ACS/chest pain.  Hgb 9.7, plt 141  Goal of Therapy:  Heparin level 0.3-0.7 units/ml Monitor platelets by anticoagulation protocol: Yes   Plan:  MD ordered 4000 unit bolus x 1 Will start heparin infusion at 900 units/hr Check anti-Xa level in 8 hours and daily while on heparin Continue to monitor H&H and platelets  Benn Moulder, PharmD Pharmacy Resident  10/21/2020 8:48 PM

## 2020-10-21 NOTE — ED Provider Notes (Addendum)
[facility] Emergency Department Provider Note   ____________________________________________    I have reviewed the triage vital signs and the nursing notes.   HISTORY  Chief Complaint Cough and Shortness of Breath     HPI Kathryn Wise is a 87 y.o. female who presents with complaints of cough and shortness of breath.  Patient reports she was just discharged yesterday after having a carotid stent placed by vascular surgery.  She reports she felt well yesterday.  This morning she woke up feeling short of breath with a cough.  She went to urgent care where she was found to have a temperature of 100.2 and her x-ray was concerning for pneumonia.  She denies chest pain  Past Medical History:  Diagnosis Date  . Arthritis   . Collagen vascular disease ([place])   . Dysphagia, pharyngoesophageal phase 09/10/2013   Upper GI study with barium swallow was done at  [facility] Mar 2015   No reflux seen  Small irreducible hiatal hernia  Mild changes of presbyeophagus (abnormal contractions of the esophagus that occur with aging) No strictures Normal gastric emptying  Incomplete visualization of stomach fold due to patient's inability turn    . GERD (gastroesophageal reflux disease)   . Heart murmur    has had years and years  . History of shingles Dec 2013   treated with steroids , post op from shoulder surgery  . History of squamous cell carcinoma 05/02/2016   right mid lateral pretibial  . Hypertension   . Hypothyroidism   . Major depressive disorder, single episode 11/05/2015  . Osteoarthritis of left shoulder 07/14/2012  . Squamous cell carcinoma of skin 05/12/2016   R mid lat pretibial - other skin cancers treated by Dr. Sharlett Iles  . Squamous cell carcinoma of skin 08/22/2020   left distal medial popliteal - [facility]    Patient Active Problem List   Diagnosis Date Noted  . Acute respiratory failure with hypoxemia ([place]) 10/21/2020  . Hyponatremia 10/21/2020  .  Troponin I above reference range 10/21/2020  . Carotid stenosis, symptomatic w/o infarct, bilateral 10/19/2020  . Tremulousness 09/22/2020  . Carotid stenosis 09/12/2020  . Urinary incontinence, urge 05/05/2020  . Arteriosclerotic cerebrovascular disease 05/05/2020  . Major depressive disorder with single episode, in full remission ([place]) 05/04/2020  . Recurrent occipital headache 03/07/2019  . Hearing loss due to cerumen impaction, left 01/23/2019  . Renal artery stenosis ([place]) 01/01/2019  . Lymphedema 01/01/2019  . PAD (peripheral artery disease) ([place]) 01/01/2019  . History of CVA (cerebrovascular accident) without residual deficits 11/13/2018  . Anemia, unspecified 11/13/2018  . Tinnitus aurium, bilateral 10/19/2018  . Blurred vision, bilateral 10/19/2018  . Insomnia due to psychological stress 10/07/2018  . Food intolerance in adult 10/07/2018  . Skin lesion of hand 10/07/2018  . Myalgia due to statin 12/10/2017  . Hyperlipidemia LDL goal <130 10/03/2017  . Hiatal hernia 07/03/2017  . Coronary artery disease 07/03/2017  . Hospital discharge follow-up 07/03/2017  . Chest pain 06/10/2017  . Pain in left wrist 09/20/2016  . Breast cancer screening by mammogram 09/20/2016  . Hypothyroidism 04/28/2015  . Renovascular hypertension 01/13/2014  . Osteopenia 10/25/2013  . Disorder of bone and cartilage 10/25/2013  . GERD (gastroesophageal reflux disease) 09/11/2013  . Encounter for preventive health examination 09/11/2013  . Heart murmur 11/28/2012  . History of shingles   . Polymyalgia rheumatica ([place]) 11/18/2012  . Edema 11/08/2012  . Osteoarthritis of left shoulder 07/14/2012    Past  Surgical History:  Procedure Laterality Date  . ARTHOSCOPIC ROTAOR CUFF REPAIR     LEFT  10+  YEARS    . CAROTID PTA/STENT INTERVENTION Left 10/19/2020   Procedure: CAROTID PTA/STENT INTERVENTION;  Surgeon: Katha Cabal, MD;  Location: [facility];  Service: Cardiovascular;   Laterality: Left;  . CATARACT EXTRACTION W/ INTRAOCULAR LENS IMPLANT     RIGHT EYE  . CATARACT EXTRACTION W/PHACO Left 03/28/2016   Procedure: CATARACT EXTRACTION PHACO AND INTRAOCULAR LENS PLACEMENT (IOC);  Surgeon: Leandrew Koyanagi, MD;  Location: [facility];  Service: Ophthalmology;  Laterality: Left;  TORIC  . FACELIFT    . FOOT SURGERY     rt foot   TUMOR REMOVED  . JOINT REPLACEMENT Left Dec 2013   shoulder  . LEFT HEART CATH AND CORONARY ANGIOGRAPHY N/A 06/12/2017   Procedure: LEFT HEART CATH AND CORONARY ANGIOGRAPHY;  Surgeon: Minna Merritts, MD;  Location: [facility];  Service: Cardiovascular;  Laterality: N/A;  . RENAL ANGIOGRAPHY Left 01/07/2019   Procedure: RENAL ANGIOGRAPHY;  Surgeon: Katha Cabal, MD;  Location: [place] [facility];  Service: Cardiovascular;  Laterality: Left;  . RENAL ARTERY STENT    . SKIN CANCER EXCISION    . TOTAL SHOULDER ARTHROPLASTY  07/14/2012   Procedure: TOTAL SHOULDER ARTHROPLASTY;  Surgeon: Johnny Bridge, MD;  Location: [facility];  Service: Orthopedics;  Laterality: Left;    Prior to Admission medications   Medication Sig Start Date End Date Taking? Authorizing Provider  amLODipine (NORVASC) 5 MG tablet TAKE 2 TABLETS BY MOUTH ONCE DAILY Patient taking differently: Take 5 mg by mouth daily. 05/06/20   [name], MD  aspirin EC 81 MG tablet Take 1 tablet (81 mg total) by mouth daily. Swallow whole. 10/20/20   Stegmayer, Joelene Millin A, PA-C  b complex vitamins capsule Take 1 capsule by mouth daily.    [provider]  Cholecalciferol (VITAMIN D-3) 1000 units CAPS Take 1,000 Units by mouth daily.    [provider]  clopidogrel (PLAVIX) 75 MG tablet Take 1 tablet (75 mg total) by mouth daily. 10/20/20   Stegmayer, Janalyn Harder, PA-C  Cyanocobalamin (VITAMIN B 12 PO) Take 1,000 mcg by mouth daily.    [provider]  furosemide (LASIX) 20 MG tablet Take 1 tablet (20 mg total) by mouth  daily. Patient not taking: Reported on 10/05/2020 12/01/18   [name], MD  ketoconazole (NIZORAL) 2 % cream Apply to the feet and between the toes every night. Patient taking differently: Apply 1 application topically at bedtime. Apply to the feet and between the toes every night. 08/22/20   Ralene Bathe, MD  levothyroxine (SYNTHROID) 88 MCG tablet TAKE 1 TABLET BY MOUTH ONCE DAILY ON AN EMPTY STOMACH. WAIT 30 MINUTES BEFORE TAKING OTHER MEDS. Patient taking differently: Take 88 mcg by mouth daily before breakfast. TAKE 1 TABLET BY MOUTH ONCE DAILY ON AN EMPTY STOMACH. WAIT 30 MINUTES BEFORE TAKING OTHER MEDS. 07/19/20   [name], MD  nitroGLYCERIN (NITROSTAT) 0.4 MG SL tablet Place 1 tablet (0.4 mg total) every 5 (five) minutes as needed under the tongue for chest pain. 06/12/17   Demetrios Loll, MD  omeprazole (PRILOSEC) 20 MG capsule TAKE 1 CAPSULE BY MOUTH ONCE EVERY MORNING Patient taking differently: Take 20 mg by mouth daily as needed (heartburn). TAKE 1 CAPSULE BY MOUTH ONCE EVERY MORNING AS NEEDED 08/01/20   [name], MD  Potassium 99 MG TABS  Take 99 mg by mouth daily.     [provider]  RESTASIS 0.05 % ophthalmic emulsion Place 1 drop into both eyes 2 (two) times daily.  10/22/15   [provider]  rosuvastatin (CRESTOR) 10 MG tablet Take 10 mg by mouth daily. 02/24/20   [provider]  SYSTANE PRESERVATIVE FREE 0.4-0.3 % SOLN Place 1 drop into both eyes 4 (four) times daily as needed (dry eyes). 08/17/19   [provider]  telmisartan (MICARDIS) 20 MG tablet Take 1 tablet (20 mg total) by mouth daily. 10/12/20   [name], MD  temazepam (RESTORIL) 15 MG capsule TAKE 1 CAPSULE BY MOUTH AT BEDTIME AS NEEDED SLEEP Patient taking differently: Take by mouth at bedtime as needed for sleep. 05/23/20   [name], MD  Turmeric 500 MG CAPS Take 500 mg by mouth daily.    [provider]     Allergies Alendronate, Lisinopril,  Metoprolol, Oxycodone, and Oxycontin [oxycodone hcl]  Family History  Problem Relation Age of Onset  . Cancer Daughter        Breast  . Breast cancer Daughter 63  . Cancer Son     Social History Social History   Tobacco Use  . Smoking status: Former Smoker    Types: Cigarettes    Quit date: 07/30/1972    Years since quitting: 48.2  . Smokeless tobacco: Never Used  . Tobacco comment: smoked for only a few months  Vaping Use  . Vaping Use: Never used  Substance Use Topics  . Alcohol use: Yes    Comment: OCCAS WINE   . Drug use: No    Review of Systems  Constitutional: Positive fever Eyes: No visual changes.  ENT: No sore throat. Cardiovascular:  as above Respiratory: As above Gastrointestinal: No abdominal pain.  No nausea, no vomiting.   Genitourinary: Negative for dysuria. Musculoskeletal: No myalgias Skin: Negative for rash. Neurological: Negative for headaches or weakness   ____________________________________________   PHYSICAL EXAM:  VITAL SIGNS: ED Triage Vitals  Enc Vitals Group     BP 10/21/20 1723 (!) 128/55     Pulse Rate 10/21/20 1719 95     Resp 10/21/20 1719 (!) 22     Temp 10/21/20 1721 98.4 F (36.9 C)     Temp Source 10/21/20 1808 Oral     SpO2 10/21/20 1719 95 %     Weight 10/21/20 1721 73.5 kg (162 lb)     Height 10/21/20 1721 1.651 m (5\' 5" )     Head Circumference --      Peak Flow --      Pain Score 10/21/20 1808 0     Pain Loc --      Pain Edu? --      Excl. in [place]? --     Constitutional: Alert and oriented.   Nose: No congestion/rhinnorhea. Mouth/Throat: Mucous membranes are moist.   Neck:  Painless ROM Cardiovascular: Normal rate, regular rhythm. Grossly normal heart sounds.  Good peripheral circulation. Respiratory: Tachypnea, no retractions.  Bibasilar Rales Gastrointestinal: Soft and nontender. No distention.  No CVA tenderness.  Musculoskeletal: No lower extremity tenderness nor edema.  Warm and well  perfused Neurologic:  Normal speech and language. No gross focal neurologic deficits are appreciated.  Skin:  Skin is warm, dry and intact. No rash noted. Psychiatric: Mood and affect are normal. Speech and behavior are normal.  ____________________________________________   LABS (all labs ordered are listed, but only abnormal results are displayed)  Labs Reviewed  CBC WITH DIFFERENTIAL/PLATELET - Abnormal; Notable for the following components:      Result Value   WBC 13.3 (*)    RBC 3.45 (*)    Hemoglobin 9.7 (*)    HCT 29.6 (*)    Platelets 141 (*)    Neutro Abs 10.8 (*)    All other components within normal limits  COMPREHENSIVE METABOLIC PANEL - Abnormal; Notable for the following components:   Sodium 128 (*)    CO2 20 (*)    Glucose, Bld 177 (*)    Calcium 8.6 (*)    All other components within normal limits  BRAIN NATRIURETIC PEPTIDE - Abnormal; Notable for the following components:   B Natriuretic Peptide 966.7 (*)    All other components within normal limits  TROPONIN I (HIGH SENSITIVITY) - Abnormal; Notable for the following components:   Troponin I (High Sensitivity) 976 (*)    All other components within normal limits  RESP PANEL BY RT-PCR (FLU A&B, COVID) ARPGX2  CULTURE, BLOOD (ROUTINE X 2)  CULTURE, BLOOD (ROUTINE X 2)  LACTIC ACID, PLASMA  LACTIC ACID, PLASMA   ____________________________________________  EKG  ED ECG REPORT I, Lavonia Drafts, the attending physician, personally viewed and interpreted this ECG.  Date: 10/21/2020  Rhythm: normal sinus rhythm QRS Axis: normal Intervals: Left bundle branch block ST/T Wave abnormalities: Nonspecific changes Narrative Interpretation: no evidence of acute ischemia  No significant change from prior EKG  ____________________________________________  RADIOLOGY  Chest x-ray viewed by me, question edema versus pneumonia ____________________________________________   PROCEDURES  Procedure(s)  performed: No  Procedures   Critical Care performed: yes  CRITICAL CARE Performed by: Lavonia Drafts   Total critical care time:30 minutes  Critical care time was exclusive of separately billable procedures and treating other patients.  Critical care was necessary to treat or prevent imminent or life-threatening deterioration.  Critical care was time spent personally by me on the following activities: development of treatment plan with patient and/or surrogate as well as nursing, discussions with consultants, evaluation of patient's response to treatment, examination of patient, obtaining history from patient or surrogate, ordering and performing treatments and interventions, ordering and review of laboratory studies, ordering and review of radiographic studies, pulse oximetry and re-evaluation of patient's condition.  ____________________________________________   INITIAL IMPRESSION / ASSESSMENT AND PLAN / ED COURSE  Pertinent labs & imaging results that were available during my care of the patient were reviewed by me and considered in my medical decision making (see chart for details).  Patient presents with shortness of breath, cough, reported fever at urgent care.  Chest x-ray there concerning for pneumonia.  Afebrile here but mildly elevated temperature.  She states her symptoms do feel like pneumonia which she has had in the past.  She was not put under general anesthesia for her procedure.  Differential includes pneumonia, URI, less likely Covid, CHF  Lab work here is notable for elevated white blood cell count.  Chest x-ray is suspicious for possible edema versus pneumonia.  Given reports of fever at urgent care have covered with Rocephin and azithromycin  Patient will require admission for further treatment given her tachypnea, oxygen saturations in the low 90s  Notified of elevated troponin of over 900, no active chest pain, given elevated BNP, likely edema on chest  x-ray suspect elevation related to strain.  Will give IV Lasix.  Will discuss with cardiology  Discussed with the hospitalist for admission, he has requested CT chest as well -----------------------------------------  7:42 PM on 10/21/2020 -----------------------------------------  Discussed with Dr. Curt Bears of cardiology, he recommends heparinization, have ordered       ____________________________________________   FINAL CLINICAL IMPRESSION(S) / ED DIAGNOSES  Final diagnoses:  Acute congestive heart failure, unspecified heart failure type [facility])        Note:  This document was prepared using Dragon voice recognition software and may include unintentional dictation errors.   Lavonia Drafts, MD 10/21/20 Raye Sorrow    Lavonia Drafts, MD 10/21/20 [phone]

## 2020-10-21 NOTE — ED Triage Notes (Signed)
Pt to ED via POV for cough and shortness of breath. Pt states that this has been going on for 3 weeks. Pt states that she was just discharged from ICU yesterday for carotid stents. Pt had CXR done at [facility] that showed a hazy infiltrates in the right upper lobe and left lung base. Pt is obviously short of breath.

## 2020-10-21 NOTE — H&P (Signed)
History and Physical   Kathryn Wise:[id number] DOB: 1930/01/16 DOA: 10/21/2020  Referring MD/NP/PA: Dr. Corky Downs  PCP: [name], MD   Outpatient Specialists: [facility] cardiology  Patient coming from: Home  Chief Complaint: Shortness of breath and cough  HPI: Kathryn Wise is a 87 y.o. female with medical history significant of GERD, hypertension, hypothyroidism, coronary artery disease, depression, dysphagia and osteoarthritis who was sent over from urgent care center where she went with complaint of cough shortness of breath as well as fever.  Patient had recent carotid stent placed by vascular surgery early yesterday.  She was feeling well when she went home but came back with the symptoms.  At the urgent care center temperature was 100.2.  X-ray was concerning for possible pneumonia and she was sent over to the hospital for further evaluation and treatment.  In the hospital patient was noted to have mild hypoxia and continued shortness of breath.  Initial chest x-ray suggested pulmonary edema versus pneumonia.  Subsequent CT chest confirmed pneumonia with some mild cavitary lesion.  She also has elevated troponin as well as BNP concerning for CHF exacerbation.  Patient has no prior documented CHF but has been on Lasix which she has not been taking.  At this point she has been admitted with acute respiratory failure with hypoxia most likely as a result of pneumonia but also suspicious for fluid overload probably reactive..  ED Course: Temperature is 99.3 blood pressure 157/57, pulse 95 respirate 22 and oxygen sats 90% on room air currently 96% 3 L.  White count is 13.3 hemoglobin 9.7 and platelets 141.  Sodium is 128 potassium 4.0 chloride 100 CO2 20 BUN 14 creatinine 0.8 and calcium 8.6.  COVID-19 screen is negative influenza is negative.  PT 14.2 INR 1.1.  Chest x-ray shows small bilateral pleural effusions with borderline cardiomegaly and vascular congestion.  This suggests mild  pulmonary edema.  There is slightly more patchy opacity in the right perihilar lung possible pneumonia.  Subsequent CT chest without contrast shows patchy perihilar groundglass opacity consistent with right lung pneumonia.  There is a possible cavitary right lower lobe nodule about 8 mm probably underlying infectious process.  Small bilateral peripheral effusions right greater than left and reactive lymph nodes.  Patient therefore being admitted for further evaluation and treatment.  Review of Systems: As per HPI otherwise 10 point review of systems negative.    Past Medical History:  Diagnosis Date  . Arthritis   . Collagen vascular disease ([place])   . Dysphagia, pharyngoesophageal phase 09/10/2013   Upper GI study with barium swallow was done at  [facility] Mar 2015   No reflux seen  Small irreducible hiatal hernia  Mild changes of presbyeophagus (abnormal contractions of the esophagus that occur with aging) No strictures Normal gastric emptying  Incomplete visualization of stomach fold due to patient's inability turn    . GERD (gastroesophageal reflux disease)   . Heart murmur    has had years and years  . History of shingles Dec 2013   treated with steroids , post op from shoulder surgery  . History of squamous cell carcinoma 05/02/2016   right mid lateral pretibial  . Hypertension   . Hypothyroidism   . Major depressive disorder, single episode 11/05/2015  . Osteoarthritis of left shoulder 07/14/2012  . Squamous cell carcinoma of skin 05/12/2016   R mid lat pretibial - other skin cancers treated by Dr. Sharlett Iles  . Squamous cell carcinoma of skin 08/22/2020  left distal medial popliteal - [facility]    Past Surgical History:  Procedure Laterality Date  . ARTHOSCOPIC ROTAOR CUFF REPAIR     LEFT  10+  YEARS    . CAROTID PTA/STENT INTERVENTION Left 10/19/2020   Procedure: CAROTID PTA/STENT INTERVENTION;  Surgeon: Katha Cabal, MD;  Location: [place] [facility];  Service: Cardiovascular;   Laterality: Left;  . CATARACT EXTRACTION W/ INTRAOCULAR LENS IMPLANT     RIGHT EYE  . CATARACT EXTRACTION W/PHACO Left 03/28/2016   Procedure: CATARACT EXTRACTION PHACO AND INTRAOCULAR LENS PLACEMENT (IOC);  Surgeon: Leandrew Koyanagi, MD;  Location: [place];  Service: Ophthalmology;  Laterality: Left;  TORIC  . FACELIFT    . FOOT SURGERY     rt foot   TUMOR REMOVED  . JOINT REPLACEMENT Left Dec 2013   shoulder  . LEFT HEART CATH AND CORONARY ANGIOGRAPHY N/A 06/12/2017   Procedure: LEFT HEART CATH AND CORONARY ANGIOGRAPHY;  Surgeon: Minna Merritts, MD;  Location: [place] [facility];  Service: Cardiovascular;  Laterality: N/A;  . RENAL ANGIOGRAPHY Left 01/07/2019   Procedure: RENAL ANGIOGRAPHY;  Surgeon: Katha Cabal, MD;  Location: [place] [facility];  Service: Cardiovascular;  Laterality: Left;  . RENAL ARTERY STENT    . SKIN CANCER EXCISION    . TOTAL SHOULDER ARTHROPLASTY  07/14/2012   Procedure: TOTAL SHOULDER ARTHROPLASTY;  Surgeon: Johnny Bridge, MD;  Location: [place];  Service: Orthopedics;  Laterality: Left;     reports that she quit smoking about 48 years ago. Her smoking use included cigarettes. She has never used smokeless tobacco. She reports current alcohol use. She reports that she does not use drugs.  Allergies  Allergen Reactions  . Alendronate Swelling    And nausea    . Lisinopril     Spike in BP  . Metoprolol Other (See Comments)    Lethargy - PT CURRENTLY TAKING   . Oxycodone     Other reaction(s): Other (See Comments) Altered mental status  . Oxycontin [Oxycodone Hcl]     Altered mental status    Family History  Problem Relation Age of Onset  . Cancer Daughter        Breast  . Breast cancer Daughter 76  . Cancer Son      Prior to Admission medications   Medication Sig Start Date End Date Taking? Authorizing Provider  amLODipine (NORVASC) 5 MG tablet TAKE 2 TABLETS BY MOUTH ONCE DAILY Patient taking differently: Take 5 mg  by mouth daily. 05/06/20   [name], MD  aspirin EC 81 MG tablet Take 1 tablet (81 mg total) by mouth daily. Swallow whole. 10/20/20   Stegmayer, Joelene Millin A, PA-C  b complex vitamins capsule Take 1 capsule by mouth daily.    [provider]  Cholecalciferol (VITAMIN D-3) 1000 units CAPS Take 1,000 Units by mouth daily.    [provider]  clopidogrel (PLAVIX) 75 MG tablet Take 1 tablet (75 mg total) by mouth daily. 10/20/20   Stegmayer, Janalyn Harder, PA-C  Cyanocobalamin (VITAMIN B 12 PO) Take 1,000 mcg by mouth daily.    [provider]  furosemide (LASIX) 20 MG tablet Take 1 tablet (20 mg total) by mouth daily. Patient not taking: Reported on 10/05/2020 12/01/18   [name], MD  ketoconazole (NIZORAL) 2 % cream Apply to the feet and between the toes every night. Patient taking differently: Apply 1 application topically at bedtime. Apply to the feet and between  the toes every night. 08/22/20   Ralene Bathe, MD  levothyroxine (SYNTHROID) 88 MCG tablet TAKE 1 TABLET BY MOUTH ONCE DAILY ON AN EMPTY STOMACH. WAIT 30 MINUTES BEFORE TAKING OTHER MEDS. Patient taking differently: Take 88 mcg by mouth daily before breakfast. TAKE 1 TABLET BY MOUTH ONCE DAILY ON AN EMPTY STOMACH. WAIT 30 MINUTES BEFORE TAKING OTHER MEDS. 07/19/20   [name], MD  nitroGLYCERIN (NITROSTAT) 0.4 MG SL tablet Place 1 tablet (0.4 mg total) every 5 (five) minutes as needed under the tongue for chest pain. 06/12/17   Demetrios Loll, MD  omeprazole (PRILOSEC) 20 MG capsule TAKE 1 CAPSULE BY MOUTH ONCE EVERY MORNING Patient taking differently: Take 20 mg by mouth daily as needed (heartburn). TAKE 1 CAPSULE BY MOUTH ONCE EVERY MORNING AS NEEDED 08/01/20   [name], MD  Potassium 99 MG TABS Take 99 mg by mouth daily.     [provider]  RESTASIS 0.05 % ophthalmic emulsion Place 1 drop into both eyes 2 (two) times daily.  10/22/15   [provider]  rosuvastatin (CRESTOR) 10  MG tablet Take 10 mg by mouth daily. 02/24/20   [provider]  SYSTANE PRESERVATIVE FREE 0.4-0.3 % SOLN Place 1 drop into both eyes 4 (four) times daily as needed (dry eyes). 08/17/19   [provider]  telmisartan (MICARDIS) 20 MG tablet Take 1 tablet (20 mg total) by mouth daily. 10/12/20   [name], MD  temazepam (RESTORIL) 15 MG capsule TAKE 1 CAPSULE BY MOUTH AT BEDTIME AS NEEDED SLEEP Patient taking differently: Take by mouth at bedtime as needed for sleep. 05/23/20   [name], MD  Turmeric 500 MG CAPS Take 500 mg by mouth daily.    [provider]    Physical Exam: Vitals:   10/21/20 1808 10/21/20 1900 10/21/20 2002 10/21/20 2102  BP: (!) 124/50 (!) 117/54 (!) 109/51 (!) 157/57  Pulse: 84 77 81 81  Resp: (!) 21 (!) 21 15 20   Temp: 99.3 F (37.4 C)  98.6 F (37 C) 98.8 F (37.1 C)  TempSrc: Oral  Oral Oral  SpO2: 95% 92% 94% 96%  Weight:    75.3 kg  Height:    5\' 5"  (1.651 m)      Constitutional: Acutely ill looking, no distress Vitals:   10/21/20 1808 10/21/20 1900 10/21/20 2002 10/21/20 2102  BP: (!) 124/50 (!) 117/54 (!) 109/51 (!) 157/57  Pulse: 84 77 81 81  Resp: (!) 21 (!) 21 15 20   Temp: 99.3 F (37.4 C)  98.6 F (37 C) 98.8 F (37.1 C)  TempSrc: Oral  Oral Oral  SpO2: 95% 92% 94% 96%  Weight:    75.3 kg  Height:    5\' 5"  (1.651 m)   Eyes: PERRL, lids and conjunctivae normal ENMT: Mucous membranes are moist. Posterior pharynx clear of any exudate or lesions.Normal dentition.  Neck: normal, supple, no masses, no thyromegaly Respiratory:  coarse breath sound bilaterally, no wheeze, some crackles and rales normal respiratory effort. No accessory muscle use.  Cardiovascular: Sinus tachycardia, no murmurs / rubs / gallops. No extremity edema. 2+ pedal pulses. No carotid bruits.  Abdomen: no tenderness, no masses palpated. No hepatosplenomegaly. Bowel sounds positive.  Musculoskeletal: no clubbing / cyanosis. No joint  deformity upper and lower extremities. Good ROM, no contractures. Normal muscle tone.  Skin: no rashes, lesions, ulcers. No induration Neurologic: CN 2-12 grossly intact. Sensation intact, DTR normal. Strength 5/5 in all  4.  Psychiatric: Normal judgment and insight. Alert and oriented x 3. Normal mood.     Labs on Admission: I have personally reviewed following labs and imaging studies  CBC: Recent Labs  Lab 10/20/20 0409 10/21/20 1737  WBC 6.1 13.3*  NEUTROABS  --  10.8*  HGB 9.3* 9.7*  HCT 29.5* 29.6*  MCV 87.3 85.8  PLT 129* 185*   Basic Metabolic Panel: Recent Labs  Lab 10/18/20 1419 10/19/20 0819 10/20/20 0409 10/21/20 1737  NA 134*  --  134* 128*  K 4.5  --  4.2 4.0  CL 100  --  107 100  CO2 26  --  23 20*  GLUCOSE 135*  --  115* 177*  BUN 14 14 11 14   CREATININE 0.94 0.93 0.93 0.89  CALCIUM 9.2  --  8.1* 8.6*   GFR: Estimated Creatinine Clearance: 44.4 mL/min (by C-G formula based on SCr of 0.89 mg/dL). Liver Function Tests: Recent Labs  Lab 10/21/20 1737  AST 29  ALT 12  ALKPHOS 38  BILITOT 1.2  PROT 6.6  ALBUMIN 3.6   No results for input(s): LIPASE, AMYLASE in the last 168 hours. No results for input(s): AMMONIA in the last 168 hours. Coagulation Profile: Recent Labs  Lab 10/21/20 1739  INR 1.1   Cardiac Enzymes: No results for input(s): CKTOTAL, CKMB, CKMBINDEX, TROPONINI in the last 168 hours. BNP (last 3 results) No results for input(s): PROBNP in the last 8760 hours. HbA1C: No results for input(s): HGBA1C in the last 72 hours. CBG: Recent Labs  Lab 10/19/20 1104  GLUCAP 93   Lipid Profile: No results for input(s): CHOL, HDL, LDLCALC, TRIG, CHOLHDL, LDLDIRECT in the last 72 hours. Thyroid Function Tests: No results for input(s): TSH, T4TOTAL, FREET4, T3FREE, THYROIDAB in the last 72 hours. Anemia Panel: No results for input(s): VITAMINB12, FOLATE, FERRITIN, TIBC, IRON, RETICCTPCT in the last 72 hours. Urine analysis:     Component Value Date/Time   COLORURINE YELLOW 05/04/2020 1458   APPEARANCEUR CLEAR 05/04/2020 1458   LABSPEC 1.010 05/04/2020 1458   PHURINE 6.0 05/04/2020 1458   GLUCOSEU NEGATIVE 05/04/2020 1458   HGBUR TRACE-INTACT (A) 05/04/2020 1458   BILIRUBINUR NEGATIVE 05/04/2020 1458   KETONESUR NEGATIVE 05/04/2020 1458   PROTEINUR 30 (A) 02/24/2019 1134   UROBILINOGEN 0.2 05/04/2020 1458   NITRITE NEGATIVE 05/04/2020 1458   LEUKOCYTESUR SMALL (A) 05/04/2020 1458   Sepsis Labs: @LABRCNTIP (procalcitonin:4,lacticidven:4) ) Recent Results (from the past 240 hour(s))  SARS CORONAVIRUS 2 (TAT 6-24 HRS) Nasopharyngeal Nasopharyngeal Swab     Status: None   Collection Time: 10/17/20  8:22 AM   Specimen: Nasopharyngeal Swab  Result Value Ref Range Status   SARS Coronavirus 2 NEGATIVE NEGATIVE Final    Comment: (NOTE) SARS-CoV-2 target nucleic acids are NOT DETECTED.  The SARS-CoV-2 RNA is generally detectable in upper and lower respiratory specimens during the acute phase of infection. Negative results do not preclude SARS-CoV-2 infection, do not rule out co-infections with other pathogens, and should not be used as the sole basis for treatment or other patient management decisions. Negative results must be combined with clinical observations, patient history, and epidemiological information. The expected result is Negative.  Fact Sheet for Patients: SugarRoll.be  Fact Sheet for Healthcare Providers: https://www.woods-mathews.com/  This test is not yet approved or cleared by the [place] FDA and  has been authorized for detection and/or diagnosis of SARS-CoV-2 by FDA under an Emergency Use Authorization (EUA). This EUA will remain  in effect (meaning  this test can be used) for the duration of the COVID-19 declaration under Se ction 564(b)(1) of the Act, 21 U.S.C. section 360bbb-3(b)(1), unless the authorization is terminated or revoked  sooner.  Performed at [facility], [place] [address], [place], [place] [address]   MRSA PCR Screening     Status: None   Collection Time: 10/19/20 11:31 AM   Specimen: Nasal Mucosa; Nasopharyngeal  Result Value Ref Range Status   MRSA by PCR NEGATIVE NEGATIVE Final    Comment:        The GeneXpert MRSA Assay (FDA approved for NASAL specimens only), is one component of a comprehensive MRSA colonization surveillance program. It is not intended to diagnose MRSA infection nor to guide or monitor treatment for MRSA infections. Performed at [facility], [place], [place] [address]   Resp Panel by RT-PCR (Flu A&B, Covid) Nasopharyngeal Swab     Status: None   Collection Time: 10/21/20  5:37 PM   Specimen: Nasopharyngeal Swab; Nasopharyngeal(NP) swabs in vial transport medium  Result Value Ref Range Status   SARS Coronavirus 2 by RT PCR NEGATIVE NEGATIVE Final    Comment: (NOTE) SARS-CoV-2 target nucleic acids are NOT DETECTED.  The SARS-CoV-2 RNA is generally detectable in upper respiratory specimens during the acute phase of infection. The lowest concentration of SARS-CoV-2 viral copies this assay can detect is 138 copies/mL. A negative result does not preclude SARS-Cov-2 infection and should not be used as the sole basis for treatment or other patient management decisions. A negative result may occur with  improper specimen collection/handling, submission of specimen other than nasopharyngeal swab, presence of viral mutation(s) within the areas targeted by this assay, and inadequate number of viral copies(<138 copies/mL). A negative result must be combined with clinical observations, patient history, and epidemiological information. The expected result is Negative.  Fact Sheet for Patients:  EntrepreneurPulse.com.au  Fact Sheet for Healthcare Providers:  IncredibleEmployment.be  This test is no t yet approved  or cleared by the [place] FDA and  has been authorized for detection and/or diagnosis of SARS-CoV-2 by FDA under an Emergency Use Authorization (EUA). This EUA will remain  in effect (meaning this test can be used) for the duration of the COVID-19 declaration under Section 564(b)(1) of the Act, 21 U.S.C.section 360bbb-3(b)(1), unless the authorization is terminated  or revoked sooner.       Influenza A by PCR NEGATIVE NEGATIVE Final   Influenza B by PCR NEGATIVE NEGATIVE Final    Comment: (NOTE) The Xpert Xpress SARS-CoV-2/FLU/RSV plus assay is intended as an aid in the diagnosis of influenza from Nasopharyngeal swab specimens and should not be used as a sole basis for treatment. Nasal washings and aspirates are unacceptable for Xpert Xpress SARS-CoV-2/FLU/RSV testing.  Fact Sheet for Patients: EntrepreneurPulse.com.au  Fact Sheet for Healthcare Providers: IncredibleEmployment.be  This test is not yet approved or cleared by the [place] FDA and has been authorized for detection and/or diagnosis of SARS-CoV-2 by FDA under an Emergency Use Authorization (EUA). This EUA will remain in effect (meaning this test can be used) for the duration of the COVID-19 declaration under Section 564(b)(1) of the Act, 21 U.S.C. section 360bbb-3(b)(1), unless the authorization is terminated or revoked.  Performed at [facility], [address] [place], [place] [address]      Radiological Exams on Admission: DG Chest 2 View  Result Date: 10/21/2020 CLINICAL DATA:  Cough for 3 weeks. Shortness of breath. Recent carotid stent placement,  discharged yesterday. Concern for pneumonia EXAM: CHEST - 2 VIEW COMPARISON:  Most recent radiograph 11/08/2018. FINDINGS: Upper normal heart size. Unchanged mediastinal contours. Aortic atherosclerosis. There is a retrocardiac hiatal hernia. Small symmetric bilateral pleural effusions. There is vascular  congestion and suggestion of mild pulmonary edema. Slightly more patchy opacity in the right perihilar lung. No pneumothorax. Left humeral arthroplasty in place. IMPRESSION: 1. Small bilateral pleural effusions. Borderline cardiomegaly with vascular congestion and suggestion of mild pulmonary edema. Findings suggestive of fluid overload/mild CHF. 2. Slightly more patchy opacity in the right perihilar lung, may be atelectasis or pneumonia. Electronically Signed   By: Keith Rake M.D.   On: 10/21/2020 18:17   CT Chest Wo Contrast  Result Date: 10/21/2020 CLINICAL DATA:  Pneumonia, effusion or abscess suspected, xray done Shortness of breath since this morning.  Cough for 3 weeks. EXAM: CT CHEST WITHOUT CONTRAST TECHNIQUE: Multidetector CT imaging of the chest was performed following the standard protocol without IV contrast. COMPARISON:  Radiograph earlier today.  Chest CTA 06/10/2017 FINDINGS: Cardiovascular: Upper normal heart size. Aortic atherosclerosis. No aortic aneurysm. Mild aortic tortuosity. There are coronary artery calcifications. Mitral annulus and aortic valvular calcifications. Trace pericardial fluid. Mediastinum/Nodes: Multiple prominent mediastinal nodes largest measuring 10 mm in the subcarinal station. Limited assessment for hilar adenopathy on this noncontrast exam. Moderate-sized hiatal hernia. Lungs/Pleura: Small bilateral pleural effusions, right greater than left. Minimal fluid in the fissures. Patchy perihilar ground-glass opacity in the right lung involves the upper, middle, and lower lobe. Few nodular areas in the area of ground-glass opacity, including a partially cavitary right lower lobe nodule measuring 8 mm, series 3, image 77. Mild biapical pleuroparenchymal scarring. There is minimal basilar septal thickening which may represent mild pulmonary edema. Mild peribronchial thickening. Upper Abdomen: Moderate hiatal hernia. Stent in the right renal artery. Abdominal vascular  calcifications. Diverticulosis noted of the splenic flexure. Musculoskeletal: There are no acute or suspicious osseous abnormalities. Left humeral arthroplasty. IMPRESSION: 1. Patchy perihilar ground-glass opacity in the right lung consistent with pneumonia. Few nodular areas in the area of ground-glass opacity, including a partially cavitary right lower lobe nodule measuring 8 mm, likely part of underlying infectious process. 2. Small bilateral pleural effusions, right greater than left. Minimal basilar septal thickening may represent mild pulmonary edema. 3. Prominent mediastinal nodes are likely reactive. 4. Coronary artery calcifications. Mitral annulus and aortic valvular calcifications. Aortic atherosclerosis. 5. Moderate hiatal hernia. Aortic Atherosclerosis (ICD10-I70.0). Electronically Signed   By: Keith Rake M.D.   On: 10/21/2020 20:06    EKG: Independently reviewed.  It shows paced rhythm with evidence of IVCD in place.  Assessment/Plan Principal Problem:   Acute respiratory failure with hypoxemia (HCC) Active Problems:   GERD (gastroesophageal reflux disease)   Hypothyroidism   Coronary artery disease   Arteriosclerotic cerebrovascular disease   Hyponatremia   Troponin I above reference range   Pneumonia     #1 acute respiratory failure secondary to pneumonia and CHF exacerbation: Based on patient's chest x-ray and CT it appears patient may have had pneumonia.  Was just discharged from the hospital yesterday and this could reflect healthcare associated pneumonia versus aspiration pneumonia following her procedure.  Patient's pulmonary edema could reflect underlying CHF.  She used to be on Lasix but not taking it.  Admit the patient.  Treat with antibiotics as well as diuretics.  Get echocardiogram.  Blood cultures obtained.  #2 pneumonia: Aspiration versus healthcare associated.  Was only discharged yesterday.  No prior exposure and COVID-19  negative.  Empiric antibiotics.   Currently on Rocephin and Zithromax.  We may expand to cover for aspiration pneumonia.  #3 CHF with elevated troponin: We will get echocardiogram.  IV Lasix.  Patient most likely has underlying CHF.  She was previously on diuretic that she has not been taking lately.  This could also be flash pulmonary edema following her procedure.  #4 coronary artery disease: Stable.  We will trend enzymes.  Follow echocardiogram.  If further elevation cardiology consult in the morning.  #5 hyponatremia: Could be reactive.  Could also be related to lung pathology.  Patient may be having early S I ADH.  Continue to monitor.  Free water restriction.  #6 hypothyroidism: Confirm on resume levothyroxine.  #7 GERD: Continue with PPIs.  #8 thrombocytopenia: Appears to be chronic.  Mild and borderline.  Continue with DVT prophylaxis but watch platelets.   DVT prophylaxis: Lovenox Code Status: Full code Family Communication: No family at bedside Disposition Plan: Home Consults called: None but may consult cardiology in the morning if needed Admission status: Inpatient  Severity of Illness: The appropriate patient status for this patient is INPATIENT. Inpatient status is judged to be reasonable and necessary in order to provide the required intensity of service to ensure the patient's safety. The patient's presenting symptoms, physical exam findings, and initial radiographic and laboratory data in the context of their chronic comorbidities is felt to place them at high risk for further clinical deterioration. Furthermore, it is not anticipated that the patient will be medically stable for discharge from the hospital within 2 midnights of admission. The following factors support the patient status of inpatient.   " The patient's presenting symptoms include shortness of breath and cough. " The worrisome physical exam findings include coarse breath sound bilaterally. " The initial radiographic and laboratory data are  worrisome because of evidence of pneumonia and CHF. " The chronic co-morbidities include coronary artery disease.   * I certify that at the point of admission it is my clinical judgment that the patient will require inpatient hospital care spanning beyond 2 midnights from the point of admission due to high intensity of service, high risk for further deterioration and high frequency of surveillance required.Barbette Merino MD [facility] Pager [phone]  If 7PM-7AM, please contact night-coverage [email or web address] Password [facility]  10/21/2020, 11:23 PM

## 2020-10-21 NOTE — Progress Notes (Signed)
Ms. Kathryn Wise, [place] are scheduled for a virtual visit with your provider today.    Just as we do with appointments in the office, we must obtain your consent to participate.  Your consent will be active for this visit and any virtual visit you may have with one of our providers in the next 365 days.    If you have a MyChart account, I can also send a copy of this consent to you electronically.  All virtual visits are billed to your insurance company just like a traditional visit in the office.  As this is a virtual visit, video technology does not allow for your provider to perform a traditional examination.  This may limit your provider's ability to fully assess your condition.  If your provider identifies any concerns that need to be evaluated in person or the need to arrange testing such as labs, EKG, etc, we will make arrangements to do so.    Although advances in technology are sophisticated, we cannot ensure that it will always work on either your end or our end.  If the connection with a video visit is poor, we may have to switch to a telephone visit.  With either a video or telephone visit, we are not always able to ensure that we have a secure connection.   I need to obtain your verbal consent now.   Are you willing to proceed with your visit today?   Kathryn Wise has provided verbal consent on 10/21/2020 for a virtual visit (video or telephone).   [place], [facility] 10/21/2020  8:38 AM   Virtual Visit via video Note   Due to COVID-19 pandemic this visit was conducted virtually. This visit type was conducted due to national recommendations for restrictions regarding the COVID-19 Pandemic (e.g. social distancing, sheltering in place) in an effort to limit this patient's exposure and mitigate transmission in our community. All issues noted in this document were discussed and addressed.  A physical exam was not performed with this format.  I connected with  Kathryn Wise  on 10/21/20 at  *8:35** by video and verified that I am speaking with the correct person using two identifiers. Kathryn Wise is currently located at home and her daughter is currently with her during visit. The provider, Mary-Margaret Hassell Done, FNP is located in their office at time of visit.  I discussed the limitations, risks, security and privacy concerns of performing an evaluation and management service by video  and the availability of in person appointments. I also discussed with the patient that there may be a patient responsible charge related to this service. The patient expressed understanding and agreed to proceed.   History and Present Illness:  Chief Complaint: Cough   HPI Patient is with her daughter. She had carotid surgery yesterday. When she got up this morning she was coughing. But her main concern is SOB with any activty and her oxygen saturation is 90 % at rest. Not sure is heart rateis going up and down at this point   Review of Systems  Constitutional: Negative for chills and fever.  HENT: Negative for congestion, sinus pain and sore throat.   Respiratory: Positive for cough and shortness of breath.   Musculoskeletal: Negative.   Neurological: Negative.   Psychiatric/Behavioral: Negative.   All other systems reviewed and are negative.      Observations/Objective: Alert and oriented- answers all questions appropriately No distress Color good Cough Sob noted when talking   Assessment and Plan:  Kathryn Wise in today with chief complaint of Cough   1. SOB (shortness of breath)  2. Cough  3. Low O2 saturation Since patient just had surgery yesterday, I suggest she go to the ED to make sure she does not have a PE. Daughter wasin agreement.        Follow Up Instructions: prn    I discussed the assessment and treatment plan with the patient. The patient was provided an opportunity to ask questions and all were answered. The patient agreed with the plan and  demonstrated an understanding of the instructions.   The patient was advised to call back or seek an in-person evaluation if the symptoms worsen or if the condition fails to improve as anticipated.  The above assessment and management plan was discussed with the patient. The patient verbalized understanding of and has agreed to the management plan. Patient is aware to call the clinic if symptoms persist or worsen. Patient is aware when to return to the clinic for a follow-up visit. Patient educated on when it is appropriate to go to the emergency department.   Time call ended:8:50 I provided 20 minutes of face-to-face time during this encounter.    Mary-Margaret Hassell Done, FNP

## 2020-10-21 NOTE — ED Triage Notes (Signed)
First Nurse Note:  Arrives with c/o SOB since 0530 this morning.  Patient states she has had cough x 3 weeks, but awoke this morning with SOB.  Dry cough noted.  DOE noted.  Daughter states patient had carotid stent placed Wednesday and was just discharged from ICU yesterday.  Patient is AAOx3.  Skin warm and dry.

## 2020-10-22 ENCOUNTER — Encounter: Payer: Self-pay | Admitting: Internal Medicine

## 2020-10-22 DIAGNOSIS — R778 Other specified abnormalities of plasma proteins: Secondary | ICD-10-CM | POA: Diagnosis not present

## 2020-10-22 DIAGNOSIS — I10 Essential (primary) hypertension: Secondary | ICD-10-CM

## 2020-10-22 DIAGNOSIS — J189 Pneumonia, unspecified organism: Secondary | ICD-10-CM | POA: Diagnosis not present

## 2020-10-22 DIAGNOSIS — E871 Hypo-osmolality and hyponatremia: Secondary | ICD-10-CM

## 2020-10-22 DIAGNOSIS — J9601 Acute respiratory failure with hypoxia: Secondary | ICD-10-CM | POA: Diagnosis not present

## 2020-10-22 DIAGNOSIS — I2510 Atherosclerotic heart disease of native coronary artery without angina pectoris: Secondary | ICD-10-CM

## 2020-10-22 LAB — CBC WITH DIFFERENTIAL/PLATELET
Abs Immature Granulocytes: 0.04 10*3/uL (ref 0.00–0.07)
Basophils Absolute: 0 10*3/uL (ref 0.0–0.1)
Basophils Relative: 0 %
Eosinophils Absolute: 0 10*3/uL (ref 0.0–0.5)
Eosinophils Relative: 0 %
HCT: 27.5 % — ABNORMAL LOW (ref 36.0–46.0)
Hemoglobin: 9 g/dL — ABNORMAL LOW (ref 12.0–15.0)
Immature Granulocytes: 0 %
Lymphocytes Relative: 19 %
Lymphs Abs: 1.9 10*3/uL (ref 0.7–4.0)
MCH: 28 pg (ref 26.0–34.0)
MCHC: 32.7 g/dL (ref 30.0–36.0)
MCV: 85.4 fL (ref 80.0–100.0)
Monocytes Absolute: 1.2 10*3/uL — ABNORMAL HIGH (ref 0.1–1.0)
Monocytes Relative: 12 %
Neutro Abs: 7.1 10*3/uL (ref 1.7–7.7)
Neutrophils Relative %: 69 %
Platelets: 133 10*3/uL — ABNORMAL LOW (ref 150–400)
RBC: 3.22 MIL/uL — ABNORMAL LOW (ref 3.87–5.11)
RDW: 15.1 % (ref 11.5–15.5)
WBC: 10.3 10*3/uL (ref 4.0–10.5)
nRBC: 0 % (ref 0.0–0.2)

## 2020-10-22 LAB — BASIC METABOLIC PANEL
Anion gap: 8 (ref 5–15)
BUN: 15 mg/dL (ref 8–23)
CO2: 22 mmol/L (ref 22–32)
Calcium: 8 mg/dL — ABNORMAL LOW (ref 8.9–10.3)
Chloride: 100 mmol/L (ref 98–111)
Creatinine, Ser: 0.82 mg/dL (ref 0.44–1.00)
GFR, Estimated: >60 mL/min (ref >60)
Glucose, Bld: 119 mg/dL — ABNORMAL HIGH (ref 70–99)
Potassium: 3.5 mmol/L (ref 3.5–5.1)
Sodium: 130 mmol/L — ABNORMAL LOW (ref 135–145)

## 2020-10-22 LAB — HEPARIN LEVEL (UNFRACTIONATED): Heparin Unfractionated: 0.18 IU/mL — ABNORMAL LOW (ref 0.30–0.70)

## 2020-10-22 LAB — CREATININE, SERUM
Creatinine, Ser: 0.81 mg/dL (ref 0.44–1.00)
GFR, Estimated: >60 mL/min (ref >60)

## 2020-10-22 LAB — TROPONIN I (HIGH SENSITIVITY): Troponin I (High Sensitivity): 545 ng/L (ref <18)

## 2020-10-22 LAB — LACTIC ACID, PLASMA: Lactic Acid, Venous: 0.9 mmol/L (ref 0.5–1.9)

## 2020-10-22 MED ORDER — HEPARIN (PORCINE) 25000 UT/250ML-% IV SOLN
1100.0000 [IU]/h | INTRAVENOUS | Status: DC
  Filled 2020-10-22: qty 250

## 2020-10-22 MED ORDER — HEPARIN (PORCINE) 25000 UT/250ML-% IV SOLN
1100.0000 [IU]/h | INTRAVENOUS | Status: DC
  Administered 2020-10-22: 1100 [IU]/h via INTRAVENOUS
  Filled 2020-10-22 (×2): qty 250

## 2020-10-22 MED ORDER — LEVOTHYROXINE SODIUM 88 MCG PO TABS
88.0000 ug | ORAL_TABLET | Freq: Every day | ORAL | Status: DC
  Administered 2020-10-22 – 2020-10-23 (×3): 88 ug via ORAL
  Filled 2020-10-22 (×4): qty 1

## 2020-10-22 MED ORDER — HEPARIN BOLUS VIA INFUSION
2200.0000 [IU] | Freq: Once | INTRAVENOUS | Status: AC
  Administered 2020-10-22: 2200 [IU] via INTRAVENOUS
  Filled 2020-10-22: qty 2200

## 2020-10-22 MED ORDER — ENOXAPARIN SODIUM 40 MG/0.4ML ~~LOC~~ SOLN: 40.0000 mg | SUBCUTANEOUS | Status: DC

## 2020-10-22 NOTE — Consult Note (Signed)
[place] for heparin Indication: chest pain/ACS  Allergies  Allergen Reactions  . Alendronate Swelling    And nausea    . Lisinopril     Spike in BP  . Metoprolol Other (See Comments)    Lethargy - PT CURRENTLY TAKING   . Oxycodone     Other reaction(s): Other (See Comments) Altered mental status  . Oxycontin [Oxycodone Hcl]     Altered mental status    Patient Measurements: Height: 5\' 5"  (165.1 cm) Weight: 75.3 kg (166 lb) IBW/kg (Calculated) : 57 Heparin Dosing Weight: 73.5  Vital Signs: Temp: 99 F (37.2 C) (03/26 1543) Temp Source: Oral (03/26 1543) BP: 95/55 (03/26 1543) Pulse Rate: 77 (03/26 1543)  Labs: Recent Labs    10/20/20 0409 10/21/20 1737 10/21/20 1739 10/22/20 0018 10/22/20 0704 10/22/20 0842  HGB 9.3* 9.7*  --  9.0*  --   --   HCT 29.5* 29.6*  --  27.5*  --   --   PLT 129* 141*  --  133*  --   --   APTT  --   --  33  --   --   --   LABPROT  --   --  14.0  --   --   --   INR  --   --  1.1  --   --   --   HEPARINUNFRC  --   --   --   --  0.18*  --   CREATININE 0.93 0.89  --  0.82  0.81  --   --   TROPONINIHS  --  976*  --   --   --  545*    Estimated Creatinine Clearance: 48.1 mL/min (by C-G formula based on SCr of 0.82 mg/dL).   Medical History: Past Medical History:  Diagnosis Date  . Arthritis   . Collagen vascular disease ([place])   . Dysphagia, pharyngoesophageal phase 09/10/2013   Upper GI study with barium swallow was done at  [facility] Mar 2015   No reflux seen  Small irreducible hiatal hernia  Mild changes of presbyeophagus (abnormal contractions of the esophagus that occur with aging) No strictures Normal gastric emptying  Incomplete visualization of stomach fold due to patient's inability turn    . GERD (gastroesophageal reflux disease)   . Heart murmur    has had years and years  . History of shingles Dec 2013   treated with steroids , post op from shoulder surgery  . History of squamous cell  carcinoma 05/02/2016   right mid lateral pretibial  . Hypertension   . Hypothyroidism   . Major depressive disorder, single episode 11/05/2015  . Osteoarthritis of left shoulder 07/14/2012  . Squamous cell carcinoma of skin 05/12/2016   R mid lat pretibial - other skin cancers treated by Dr. Sharlett Iles  . Squamous cell carcinoma of skin 08/22/2020   left distal medial popliteal - [facility]    Medications:  No anticoagulants prior to admission per chart review  Assessment: 87 yo F presented with new onset SOB with history of cough x 3 weeks. PMH includes GERD, HTN, hypothyroidism, and MDD. Pharmacy has been consulted to dose heparin for ACS/chest pain.  Labs: Hgb 9.7>9.0, plt 141>133  Date Time HL Rate/Comment 3/26 0704 0.18 900 > 1100 un/hr 3/26 1620 --- Hep gtt off 1205-1630; reconsulted for restart  Goal of Therapy:  Heparin level 0.3-0.7 units/ml Monitor platelets by anticoagulation protocol: Yes   Plan:  Discontinue prophylactic lovenox and resume heparin starting now.  Will give 2200 unit bolus x 1; then Restart heparin infusion at last titrated rate of 1100 units/hr Check anti-Xa level in 8 hours and daily while on heparin Continue to monitor H&H and platelets  Lorna Dibble, [facility] Clinical Pharmacist 10/22/2020

## 2020-10-22 NOTE — Plan of Care (Signed)

## 2020-10-22 NOTE — Consult Note (Signed)
[place] for heparin Indication: chest pain/ACS  Allergies  Allergen Reactions  . Alendronate Swelling    And nausea    . Lisinopril     Spike in BP  . Metoprolol Other (See Comments)    Lethargy - PT CURRENTLY TAKING   . Oxycodone     Other reaction(s): Other (See Comments) Altered mental status  . Oxycontin [Oxycodone Hcl]     Altered mental status    Patient Measurements: Height: 5\' 5"  (165.1 cm) Weight: 75.3 kg (166 lb) IBW/kg (Calculated) : 57 Heparin Dosing Weight: 73.5  Vital Signs: Temp: 98.6 F (37 C) (03/26 0806) Temp Source: Oral (03/26 0806) BP: 104/50 (03/26 0806) Pulse Rate: 70 (03/26 0806)  Labs: Recent Labs    10/20/20 0409 10/21/20 1737 10/21/20 1739 10/22/20 0018 10/22/20 0704  HGB 9.3* 9.7*  --  9.0*  --   HCT 29.5* 29.6*  --  27.5*  --   PLT 129* 141*  --  133*  --   APTT  --   --  33  --   --   LABPROT  --   --  14.0  --   --   INR  --   --  1.1  --   --   HEPARINUNFRC  --   --   --   --  0.18*  CREATININE 0.93 0.89  --  0.82  0.81  --   TROPONINIHS  --  976*  --   --   --     Estimated Creatinine Clearance: 48.1 mL/min (by C-G formula based on SCr of 0.82 mg/dL).   Medical History: Past Medical History:  Diagnosis Date  . Arthritis   . Collagen vascular disease ([place])   . Dysphagia, pharyngoesophageal phase 09/10/2013   Upper GI study with barium swallow was done at  [facility] Mar 2015   No reflux seen  Small irreducible hiatal hernia  Mild changes of presbyeophagus (abnormal contractions of the esophagus that occur with aging) No strictures Normal gastric emptying  Incomplete visualization of stomach fold due to patient's inability turn    . GERD (gastroesophageal reflux disease)   . Heart murmur    has had years and years  . History of shingles Dec 2013   treated with steroids , post op from shoulder surgery  . History of squamous cell carcinoma 05/02/2016   right mid lateral pretibial  .  Hypertension   . Hypothyroidism   . Major depressive disorder, single episode 11/05/2015  . Osteoarthritis of left shoulder 07/14/2012  . Squamous cell carcinoma of skin 05/12/2016   R mid lat pretibial - other skin cancers treated by Dr. Sharlett Iles  . Squamous cell carcinoma of skin 08/22/2020   left distal medial popliteal - [facility]    Medications:  No anticoagulants prior to admission per chart review  Assessment: 87 yo F presented with new onset SOB with history of cough x 3 weeks. PMH includes GERD, HTN, hypothyroidism, and MDD. Pharmacy has been consulted to dose heparin for ACS/chest pain.  Hgb 9.7, plt 141  Goal of Therapy:  Heparin level 0.3-0.7 units/ml Monitor platelets by anticoagulation protocol: Yes   Plan:  3/26 @ 0704 HL= 0.18 Will order 2200 unit bolus x 1 Will increase heparin infusion to 1100 units/hr Check anti-Xa level in 8 hours and daily while on heparin Continue to monitor H&H and platelets  Chinita Greenland PharmD Clinical Pharmacist 10/22/2020

## 2020-10-22 NOTE — Plan of Care (Signed)
  Problem: Education: Goal: Knowledge of General Education information will improve Description: Including pain rating scale, medication(s)/side effects and non-pharmacologic comfort measures Outcome: Progressing   Problem: Health Behavior/Discharge Planning: Goal: Ability to manage health-related needs will improve Outcome: Progressing   Problem: Clinical Measurements: Goal: Ability to maintain clinical measurements within normal limits will improve Outcome: Progressing Goal: Will remain free from infection Outcome: Progressing Goal: Diagnostic test results will improve Outcome: Progressing Goal: Respiratory complications will improve Outcome: Progressing Goal: Cardiovascular complication will be avoided Outcome: Progressing   Problem: Clinical Measurements: Goal: Will remain free from infection Outcome: Progressing   Problem: Clinical Measurements: Goal: Diagnostic test results will improve Outcome: Progressing   Problem: Clinical Measurements: Goal: Respiratory complications will improve Outcome: Progressing   Problem: Clinical Measurements: Goal: Cardiovascular complication will be avoided Outcome: Progressing   Problem: Nutrition: Goal: Adequate nutrition will be maintained Outcome: Progressing   Problem: Activity: Goal: Risk for activity intolerance will decrease Outcome: Progressing   Problem: Coping: Goal: Level of anxiety will decrease Outcome: Progressing   Problem: Elimination: Goal: Will not experience complications related to bowel motility Outcome: Progressing Goal: Will not experience complications related to urinary retention Outcome: Progressing   Problem: Elimination: Goal: Will not experience complications related to urinary retention Outcome: Progressing   Problem: Pain Managment: Goal: General experience of comfort will improve Outcome: Progressing   Problem: Safety: Goal: Ability to remain free from injury will improve Outcome:  Progressing   Problem: Skin Integrity: Goal: Risk for impaired skin integrity will decrease Outcome: Progressing

## 2020-10-22 NOTE — Hospital Course (Addendum)
Kathryn Wise is a 87 y.o. female with medical history significant of GERD, hypertension, hypothyroidism, coronary artery disease, depression, dysphagia and osteoarthritis who was referred to the ED on 10/21/20 from urgent care where she was seen for complaints of cough shortness of breath as well as fever. She'd had carotid stent placement the day before.  Chest xray at urgent care was concerning for PNA and she had a temp of 100.2 F there.  Imaging here confirmed PNA and also showed some pulmonary edema.  Patient admitted at started on antibiotics and diuretics.    She had troponin elevation in the ED, was started on heparin drip.  Cardiology consulted and attributes to demand ischemia, so heparin infusion stopped.  Pt had no chest pain or any signs of ACS.  Patient's respiratory status improved significantly with diuresis and antibiotics.

## 2020-10-22 NOTE — Consult Note (Addendum)
Cardiology Consultation:   Patient ID: Kathryn Wise MRN: [id number]; DOB: June 06, 1930  Admit date: 10/21/2020 Date of Consult: 10/22/2020  PCP:  [name], MD   [place]  Cardiologist:  Remotely seen by Dr. Irving Burton 2018 Advanced Practice Provider:  No care team member to display Electrophysiologist:  None   Patient Profile:   Kathryn Wise is a 87 y.o. female with a hx of AS, HTN, polymyalgia rheumatica, HLD, hypothyroidism, nonosbtructive CAD, PAD, renal artery stenosis s/p left stenting 12/2018, carotid artery disease s/p recent left stent who is being seen today for the evaluation of CHF at the request of Dr. Arbutus Ped.  History of Present Illness:   Ms. Thomaston has been seen by Dr. Saunders Revel in the past. Previously seen by Dr. [name] for cardiac murmur in 2017. TTE showed LVEF 60-65%, no RMA, mild concentric LVH with mild LVOT gradient, G1DD, mild MR, RV systolic function was normal. Reports previous stress tests with her PCP that were normal.   Last Seen during admission 05/2017 for chest pain and dyspnea. Lexiscan was abdnormal and she underwent heart cath which showed moderate LCX and RCA disease with normal LV/hyperdynamic function. Medical management was recommended. Echo showed normal LV function. She was lost to follow-up.   Patient had left renal artery stenting 12/2018 for difficult to control hypertension.  Patient follows with [facility] for carotid artery disease. She underwent recent left stent placement 10/19/20 and was discharged the next day.   Patient presented to [facility] ER 10/21/20 for SOB and cough. She went to an urgent care who noted fever 100.2 F and CXR with pna. No chest pain. She says the cough started prior to the carotid stenting procedure. The lower leg edema, orthopnea, and worsening sob was new. Says she has been having recent BP issues and volume overload over the last month. She was started on lasix at that time. Reports dry weight of 152lbs.    In the ER vitals showed she was tachypneic, hypoxic placed on 3L O2. Labs showed Hgb 9.7, WBC 13.3, glucose 177, CO2 20, sodium 128. BNP 966.  CXR showed edema vs ona. HS troponin came back at 545>976. She was given IV laisx and started on IV heparin. CT chest showed RL pna, small b/l peripheral effusions. EKG showed SR with LBBB (old).  On interview she says she is feeling much better. Denies chest pain. Breathing is much better.    Past Medical History:  Diagnosis Date  . Arthritis   . Collagen vascular disease ([place])   . Dysphagia, pharyngoesophageal phase 09/10/2013   Upper GI study with barium swallow was done at  [facility] Mar 2015   No reflux seen  Small irreducible hiatal hernia  Mild changes of presbyeophagus (abnormal contractions of the esophagus that occur with aging) No strictures Normal gastric emptying  Incomplete visualization of stomach fold due to patient's inability turn    . GERD (gastroesophageal reflux disease)   . Heart murmur    has had years and years  . History of shingles Dec 2013   treated with steroids , post op from shoulder surgery  . History of squamous cell carcinoma 05/02/2016   right mid lateral pretibial  . Hypertension   . Hypothyroidism   . Major depressive disorder, single episode 11/05/2015  . Osteoarthritis of left shoulder 07/14/2012  . Squamous cell carcinoma of skin 05/12/2016   R mid lat pretibial - other skin cancers treated by Dr. Sharlett Iles  . Squamous cell  carcinoma of skin 08/22/2020   left distal medial popliteal - [facility]    Past Surgical History:  Procedure Laterality Date  . ARTHOSCOPIC ROTAOR CUFF REPAIR     LEFT  10+  YEARS    . CAROTID PTA/STENT INTERVENTION Left 10/19/2020   Procedure: CAROTID PTA/STENT INTERVENTION;  Surgeon: Katha Cabal, MD;  Location: [place] [facility];  Service: Cardiovascular;  Laterality: Left;  . CATARACT EXTRACTION W/ INTRAOCULAR LENS IMPLANT     RIGHT EYE  . CATARACT EXTRACTION W/PHACO Left  03/28/2016   Procedure: CATARACT EXTRACTION PHACO AND INTRAOCULAR LENS PLACEMENT (IOC);  Surgeon: Leandrew Koyanagi, MD;  Location: [place];  Service: Ophthalmology;  Laterality: Left;  TORIC  . FACELIFT    . FOOT SURGERY     rt foot   TUMOR REMOVED  . JOINT REPLACEMENT Left Dec 2013   shoulder  . LEFT HEART CATH AND CORONARY ANGIOGRAPHY N/A 06/12/2017   Procedure: LEFT HEART CATH AND CORONARY ANGIOGRAPHY;  Surgeon: Minna Merritts, MD;  Location: [place] [facility];  Service: Cardiovascular;  Laterality: N/A;  . RENAL ANGIOGRAPHY Left 01/07/2019   Procedure: RENAL ANGIOGRAPHY;  Surgeon: Katha Cabal, MD;  Location: [place] [facility];  Service: Cardiovascular;  Laterality: Left;  . RENAL ARTERY STENT    . SKIN CANCER EXCISION    . TOTAL SHOULDER ARTHROPLASTY  07/14/2012   Procedure: TOTAL SHOULDER ARTHROPLASTY;  Surgeon: Johnny Bridge, MD;  Location: [place];  Service: Orthopedics;  Laterality: Left;     Home Medications:  Prior to Admission medications   Medication Sig Start Date End Date Taking? Authorizing Provider  amLODipine (NORVASC) 5 MG tablet TAKE 2 TABLETS BY MOUTH ONCE DAILY Patient taking differently: Take 5 mg by mouth daily. 05/06/20  Yes [name], MD  aspirin EC 81 MG tablet Take 1 tablet (81 mg total) by mouth daily. Swallow whole. 10/20/20  Yes Stegmayer, Joelene Millin A, PA-C  b complex vitamins capsule Take 1 capsule by mouth daily.   Yes [provider]  Cholecalciferol (VITAMIN D-3) 1000 units CAPS Take 1,000 Units by mouth daily.   Yes [provider]  clopidogrel (PLAVIX) 75 MG tablet Take 1 tablet (75 mg total) by mouth daily. 10/20/20  Yes Stegmayer, Janalyn Harder, PA-C  Cyanocobalamin (VITAMIN B 12 PO) Take 1,000 mcg by mouth daily.   Yes [provider]  furosemide (LASIX) 20 MG tablet Take 1 tablet (20 mg total) by mouth daily. Patient taking differently: Take 20 mg by mouth daily. 12/01/18  Yes [name], MD   ketoconazole (NIZORAL) 2 % cream Apply to the feet and between the toes every night. Patient taking differently: Apply 1 application topically at bedtime. Apply to the feet and between the toes every night. 08/22/20  Yes Ralene Bathe, MD  levothyroxine (SYNTHROID) 88 MCG tablet TAKE 1 TABLET BY MOUTH ONCE DAILY ON AN EMPTY STOMACH. WAIT 30 MINUTES BEFORE TAKING OTHER MEDS. Patient taking differently: No sig reported 07/19/20  Yes [name], MD  omeprazole (PRILOSEC) 20 MG capsule TAKE 1 CAPSULE BY MOUTH ONCE EVERY MORNING Patient taking differently: Take 20 mg by mouth daily as needed (heartburn). TAKE 1 CAPSULE BY MOUTH ONCE EVERY MORNING AS NEEDED 08/01/20  Yes [name], MD  Potassium 99 MG TABS Take 99 mg by mouth daily.    Yes [provider]  RESTASIS 0.05 % ophthalmic emulsion Place 1 drop into both eyes 2 (two) times daily.  10/22/15  Yes [provider]  rosuvastatin (CRESTOR) 10 MG tablet Take 10 mg by mouth daily. 02/24/20  Yes [provider]  SYSTANE PRESERVATIVE FREE 0.4-0.3 % SOLN Place 1 drop into both eyes 4 (four) times daily as needed (dry eyes). 08/17/19  Yes [provider]  telmisartan (MICARDIS) 20 MG tablet Take 1 tablet (20 mg total) by mouth daily. 10/12/20  Yes [name], MD  temazepam (RESTORIL) 15 MG capsule TAKE 1 CAPSULE BY MOUTH AT BEDTIME AS NEEDED SLEEP Patient taking differently: Take by mouth at bedtime as needed for sleep. 05/23/20  Yes [name], MD  Turmeric 500 MG CAPS Take 500 mg by mouth daily.   Yes [provider]  nitroGLYCERIN (NITROSTAT) 0.4 MG SL tablet Place 1 tablet (0.4 mg total) every 5 (five) minutes as needed under the tongue for chest pain. Patient not taking: Reported on 10/22/2020 06/12/17   Demetrios Loll, MD    Inpatient Medications: Scheduled Meds: . clopidogrel  75 mg Oral Daily  . furosemide  40 mg Intravenous BID  . levothyroxine  88 mcg Oral QHS  . sodium chloride  flush  3 mL Intravenous Q12H  . temazepam  15 mg Oral QHS   Continuous Infusions: . sodium chloride    . azithromycin    . cefTRIAXone (ROCEPHIN)  IV    . heparin 1,100 Units/hr (10/22/20 1002)   PRN Meds: sodium chloride, acetaminophen, ondansetron (ZOFRAN) IV, sodium chloride flush  Allergies:    Allergies  Allergen Reactions  . Alendronate Swelling    And nausea    . Lisinopril     Spike in BP  . Metoprolol Other (See Comments)    Lethargy - PT CURRENTLY TAKING   . Oxycodone     Other reaction(s): Other (See Comments) Altered mental status  . Oxycontin [Oxycodone Hcl]     Altered mental status    Social History:   Social History   Socioeconomic History  . Marital status: Widowed    Spouse name: Not on file  . Number of children: Not on file  . Years of education: Not on file  . Highest education level: Not on file  Occupational History  . Not on file  Tobacco Use  . Smoking status: Former Smoker    Types: Cigarettes    Quit date: 07/30/1972    Years since quitting: 48.2  . Smokeless tobacco: Never Used  . Tobacco comment: smoked for only a few months  Vaping Use  . Vaping Use: Never used  Substance and Sexual Activity  . Alcohol use: Yes    Comment: OCCAS WINE   . Drug use: No  . Sexual activity: Never  Other Topics Concern  . Not on file  Social History Narrative  . Not on file   Social Determinants of Health   Financial Resource Strain: Low Risk   . Difficulty of Paying Living Expenses: Not hard at all  Food Insecurity: No Food Insecurity  . Worried About Charity fundraiser in the Last Year: Never true  . Ran Out of Food in the Last Year: Never true  Transportation Needs: No Transportation Needs  . Lack of Transportation (Medical): No  . Lack of Transportation (Non-Medical): No  Physical Activity: Sufficiently Active  . Days of Exercise per Week: 4 days  . Minutes of Exercise per Session: 60 min  Stress: No Stress Concern Present  . Feeling  of Stress : Not at all  Social Connections: Unknown  .  Frequency of Communication with Friends and Family: More than three times a week  . Frequency of Social Gatherings with Friends and Family: Not on file  . Attends Religious Services: Not on file  . Active Member of Clubs or Organizations: Not on file  . Attends Archivist Meetings: Not on file  . Marital Status: Not on file  Intimate Partner Violence: Not At Risk  . Fear of Current or Ex-Partner: No  . Emotionally Abused: No  . Physically Abused: No  . Sexually Abused: No    Family History:    Family History  Problem Relation Age of Onset  . Cancer Daughter        Breast  . Breast cancer Daughter 29  . Cancer Son      ROS:  Please see the history of present illness.  All other ROS reviewed and negative.     Physical Exam/Data:   Vitals:   10/21/20 2102 10/22/20 0043 10/22/20 0437 10/22/20 0806  BP: (!) 157/57 (!) 123/51 (!) 106/50 (!) 104/50  Pulse: 81 75 83 70  Resp: 20  20 16   Temp: 98.8 F (37.1 C) 98.2 F (36.8 C) 99.1 F (37.3 C) 98.6 F (37 C)  TempSrc: Oral Oral Oral Oral  SpO2: 96% 93% 96% (!) 89%  Weight: 75.3 kg     Height: 5\' 5"  (1.651 m)       Intake/Output Summary (Last 24 hours) at 10/22/2020 1027 Last data filed at 10/22/2020 0900 Gross per 24 hour  Intake 1254.35 ml  Output 1200 ml  Net 54.35 ml   Last 3 Weights 10/21/2020 10/21/2020 10/19/2020  Weight (lbs) 166 lb 162 lb 152 lb  Weight (kg) 75.297 kg 73.483 kg 68.947 kg     Body mass index is 27.62 kg/m.  General:  Well nourished, well developed, in no acute distress HEENT: normal Lymph: no adenopathy Neck: minimal JVD Endocrine:  No thryomegaly Vascular: No carotid bruits; FA pulses 2+ bilaterally without bruits  Cardiac:  normal S1, S2; RRR; + murmur  Lungs:  + wheezing, no rhonchi or rales  Abd: soft, nontender, no hepatomegaly  Ext: Trace lower leg edema Musculoskeletal:  No deformities, BUE and BLE strength normal and  equal Skin: warm and dry  Neuro:  CNs 2-12 intact, no focal abnormalities noted Psych:  Normal affect   EKG:  The EKG was personally reviewed and demonstrates:  Sr, LBBB Telemetry:  Telemetry was personally reviewed and demonstrates:  SR, Hr 70-80s  Relevant CV Studies:  Echo ordered  Echo 2018 Study Conclusions   - Left ventricle: The cavity size was normal. Near cavity  obliteration in systole. There was mild focal basal and mild  concentric hypertrophy of the septum. Systolic function was  normal. The estimated ejection fraction was in the range of 55%  to 60%. Wall motion was normal; there were no regional wall  motion abnormalities. Doppler parameters are consistent with  abnormal left ventricular relaxation (grade 1 diastolic  dysfunction).  - Aortic valve: Transvalvular velocity was increased. There was  very mild stenosis.  - Mitral valve: Calcified annulus. There was moderate  regurgitation.  - Left atrium: The atrium was mildly dilated.  - Right ventricle: Systolic function was normal.  - Pulmonary arteries: Systolic pressure was moderately elevated. PA  peak pressure: 50 mm Hg (S).    Cardiac cath 05/2017  Mid RCA lesion is 50% stenosed.  Prox Cx to Mid Cx lesion is 55% stenosed.  Ost LM to Mid  LM lesion is 30% stenosed.  The left ventricular ejection fraction is greater than 65% by visual estimate.  LV end diastolic pressure is normal.  There is hyperdynamic left ventricular systolic function.  Final Conclusions:   Moderate mid LCX and RCA disease, Medical management recommended Normal/hyperdynamic function  Recommendations:  Follow up on echocardiogram Start metoprolol tartrate 25 mg po BID  Laboratory Data:  High Sensitivity Troponin:   Recent Labs  Lab 10/21/20 1737  TROPONINIHS 976*     Chemistry Recent Labs  Lab 10/20/20 0409 10/21/20 1737 10/22/20 0018  NA 134* 128* 130*  K 4.2 4.0 3.5  CL 107 100 100  CO2  23 20* 22  GLUCOSE 115* 177* 119*  BUN 11 14 15   CREATININE 0.93 0.89 0.82  0.81  CALCIUM 8.1* 8.6* 8.0*  GFRNONAA 59* >60 >60  >60  ANIONGAP 4* 8 8    Recent Labs  Lab 10/21/20 1737  PROT 6.6  ALBUMIN 3.6  AST 29  ALT 12  ALKPHOS 38  BILITOT 1.2   Hematology Recent Labs  Lab 10/20/20 0409 10/21/20 1737 10/22/20 0018  WBC 6.1 13.3* 10.3  RBC 3.38* 3.45* 3.22*  HGB 9.3* 9.7* 9.0*  HCT 29.5* 29.6* 27.5*  MCV 87.3 85.8 85.4  MCH 27.5 28.1 28.0  MCHC 31.5 32.8 32.7  RDW 15.0 15.0 15.1  PLT 129* 141* 133*   BNP Recent Labs  Lab 10/21/20 1737  BNP 966.7*    DDimer No results for input(s): DDIMER in the last 168 hours.   Radiology/Studies:  DG Chest 2 View  Result Date: 10/21/2020 CLINICAL DATA:  Cough for 3 weeks. Shortness of breath. Recent carotid stent placement, discharged yesterday. Concern for pneumonia EXAM: CHEST - 2 VIEW COMPARISON:  Most recent radiograph 11/08/2018. FINDINGS: Upper normal heart size. Unchanged mediastinal contours. Aortic atherosclerosis. There is a retrocardiac hiatal hernia. Small symmetric bilateral pleural effusions. There is vascular congestion and suggestion of mild pulmonary edema. Slightly more patchy opacity in the right perihilar lung. No pneumothorax. Left humeral arthroplasty in place. IMPRESSION: 1. Small bilateral pleural effusions. Borderline cardiomegaly with vascular congestion and suggestion of mild pulmonary edema. Findings suggestive of fluid overload/mild CHF. 2. Slightly more patchy opacity in the right perihilar lung, may be atelectasis or pneumonia. Electronically Signed   By: Keith Rake M.D.   On: 10/21/2020 18:17   CT Chest Wo Contrast  Result Date: 10/21/2020 CLINICAL DATA:  Pneumonia, effusion or abscess suspected, xray done Shortness of breath since this morning.  Cough for 3 weeks. EXAM: CT CHEST WITHOUT CONTRAST TECHNIQUE: Multidetector CT imaging of the chest was performed following the standard protocol  without IV contrast. COMPARISON:  Radiograph earlier today.  Chest CTA 06/10/2017 FINDINGS: Cardiovascular: Upper normal heart size. Aortic atherosclerosis. No aortic aneurysm. Mild aortic tortuosity. There are coronary artery calcifications. Mitral annulus and aortic valvular calcifications. Trace pericardial fluid. Mediastinum/Nodes: Multiple prominent mediastinal nodes largest measuring 10 mm in the subcarinal station. Limited assessment for hilar adenopathy on this noncontrast exam. Moderate-sized hiatal hernia. Lungs/Pleura: Small bilateral pleural effusions, right greater than left. Minimal fluid in the fissures. Patchy perihilar ground-glass opacity in the right lung involves the upper, middle, and lower lobe. Few nodular areas in the area of ground-glass opacity, including a partially cavitary right lower lobe nodule measuring 8 mm, series 3, image 77. Mild biapical pleuroparenchymal scarring. There is minimal basilar septal thickening which may represent mild pulmonary edema. Mild peribronchial thickening. Upper Abdomen: Moderate hiatal hernia. Stent in the right renal  artery. Abdominal vascular calcifications. Diverticulosis noted of the splenic flexure. Musculoskeletal: There are no acute or suspicious osseous abnormalities. Left humeral arthroplasty. IMPRESSION: 1. Patchy perihilar ground-glass opacity in the right lung consistent with pneumonia. Few nodular areas in the area of ground-glass opacity, including a partially cavitary right lower lobe nodule measuring 8 mm, likely part of underlying infectious process. 2. Small bilateral pleural effusions, right greater than left. Minimal basilar septal thickening may represent mild pulmonary edema. 3. Prominent mediastinal nodes are likely reactive. 4. Coronary artery calcifications. Mitral annulus and aortic valvular calcifications. Aortic atherosclerosis. 5. Moderate hiatal hernia. Aortic Atherosclerosis (ICD10-I70.0). Electronically Signed   By: Keith Rake M.D.   On: 10/21/2020 20:06   PERIPHERAL VASCULAR CATHETERIZATION  Result Date: 10/19/2020 See Op Note    Assessment and Plan:   Acute hypoxic respiratory failure due to PNA and CHF - in the setting of post-op from recent left carotid stenting found to have pna and acute CHF - elevated BNP and troponin>>echo ordered - supplemental O2>>now off - Iv lasix - IV abx per IM - patient feels breathing is much better  Elevated troponin - troponin elevated 545> 976. Patient denies anginal symptoms - LHC in 2018 with nonobstructive disease - echo ordered - IV heparin started. Given that patient is asymptomatic can likely d/c - If echo is abnormal may need repeat cath. If wnl, further work-up as OP - On plavix for PAD - continue statin. Can consider BB in OP, seems to have had issues with BP for a couple years  Acute diastolic CHF - BNP elevated and imaging with possible edema - Echo ordered. Prior echo showed LVEF 55-60%, G1DD, mod MR, mild AS - IV lasix 40 mg BID - PTA lasix 20mg  daily since a month ago. Apparently had has been having some BP issues with new LLE, breathing issues. - UOP -1.2L  - Weight 162lbs, Patient reports dry weight around 152lbs. - creatinine stable - edema improving, continue diuresis  Carotid artery disease s/p recent left stenting - per [facility] - plavix, statin  HLD - continue rosuvastatin - LDL goal <70  HTN  - PTA amlodipine 5mg  daily and telmisartan (not continued on admission) - IV lasix - pressures wnl - would consider low dose BB for CAD/CHF  Mod MR Mild AS - echo ordered as above   For questions or updates, please contact [facility] Please consult [email or web address] for contact info under    Signed, Cadence Arlyss Repress  10/22/2020 10:27 AM   Attending Note:   The patient was seen and examined.  Agree with assessment and plan as noted above.  Changes made to the above note as needed.  Patient seen and independently examined  with  Cadence Kathlen Mody, PA .   We discussed all aspects of the encounter. I agree with the assessment and plan as stated above.  1.   Elevated troponins:   Most likely due to demand ischemia.  She denies any CP.   Ok to DC heparin .   Will check an echo .  If her LV function is normal,  We can do further work up as OP . If her LV function is depressed, we will need to consider cath   2.  LBBB :  Stable  3.  Pneumonia :  Plans per IM   4. Carotid artery disease:   S/p stenting   5.  HTN:  She was on amlodipine and telmisartan as OP.  The amlodipine may be contributing  to her leg edeam   5.  MR / LVOT gradient;  Repeat echo today    I have spent a total of 40 minutes with patient reviewing hospital  notes , telemetry, EKGs, labs and examining patient as well as establishing an assessment and plan that was discussed with the patient. > 50% of time was spent in direct patient care.    Thayer Headings, Brooke Bonito., MD, [facility] 10/22/2020, 11:53 AM [address]  [place] Pager [phone]

## 2020-10-22 NOTE — Progress Notes (Signed)
PROGRESS NOTE    Kathryn Wise   GGE:[id number]  DOB: [birth year]  PCP: [name], MD    DOA: 10/21/2020 LOS: 1   Brief Narrative   Kathryn Wise is a 87 y.o. female with medical history significant of GERD, hypertension, hypothyroidism, coronary artery disease, depression, dysphagia and osteoarthritis who was referred to the ED on 10/21/20 from urgent care where she was seen for complaints of cough shortness of breath as well as fever. She'd had carotid stent placement the day before.  Chest xray at urgent care was concerning for PNA and she had a temp of 100.2 F there.  Imaging here confirmed PNA and also showed some pulmonary edema.  Patient admitted at started on antibiotics and diuretics.    She had troponin elevation in the ED, was started on heparin drip.  Cardiology consulted and attributes to demand ischemia, so heparin infusion stopped.  Pt had no chest pain or any signs of ACS.  Patient's respiratory status improved significantly today.       Assessment & Plan   Principal Problem:   Acute respiratory failure with hypoxemia (HCC) Active Problems:   GERD (gastroesophageal reflux disease)   Hypothyroidism   Coronary artery disease   Arteriosclerotic cerebrovascular disease   Hyponatremia   Troponin I above reference range   Pneumonia   Acute respiratory failure with hypoxia -present on admission due to pneumonia and CHF.  Patient presented with shortness of breath and O2 sat 89% on room air. 3/26: Weaned to room air --Oxygen if needed to maintain sats above 90%  Community-acquired pneumonia versus aspiration -patient developed fever and shortness of breath in the 24 hours following her procedure. --Continue empiric antibiotics --Follow cultures  Acute on chronic diastolic CHF -patient reported 14 pound weight gain, increased lower extremity swelling and worsening shortness of breath.  This is improved since admission and diuresis.  She also reported not  recently taking her diuretic. --Cardiology following, appreciate recommendations --Continue IV Lasix 40 mg twice daily --Follow-up pending echo --Monitor volume status with strict I/O's Daily weights --Fluid restriction  Elevated troponin -likely due to demand ischemia in the setting of above.  Was started on heparin drip empirically per ACS protocol.  Troponin has trended down and patient is without any chest pain.  Appreciate cardiology's input.  Hyponatremia -presented with sodium 128.  Suspect hypervolemic in the setting of CHF and volume overload.  Improving with diuretic.  Monitor BMP daily.  She may also have some component of SIADH given her respiratory issues.  On fluid restriction as above.  Carotid artery stenosis status post recent left stent -on 3/24 with vascular surgery.  Continue Plavix and statin  Hypertension -takes amlodipine 5 mg and telmisartan at home which are held and patient normotensive.  Continue IV Lasix.  Consider addition of low-dose beta-blocker given her CAD and CHF.  CAD -stable with no active chest pain.  Echo is pending.  Cardiology is following.  PAD -on Plavix  Hyperlipidemia -on statin.  LDL goal under 70  Hypothyroidism -continue levothyroxine  GERD -continue PPI  Thrombocytopenia -appears chronic.  Mild.  Monitor CBC closely given use of heparin.  Patient BMI: Body mass index is 27.62 kg/m.   DVT prophylaxis: enoxaparin (LOVENOX) injection 40 mg Start: 10/22/20 2200   Diet:  Diet Orders (From admission, onward)    Start     Ordered   10/22/20 0827  Diet Heart Room service appropriate? Yes; Fluid consistency: Thin; Fluid restriction: 1500 mL Fluid  Diet effective now       Question Answer Comment  Room service appropriate? Yes   Fluid consistency: Thin   Fluid restriction: 1500 mL Fluid      10/22/20 0826            Code Status: Full Code    Subjective 10/22/20    Pt says feeling a great deal better today, able to take deeper  breaths and not feeling in distress.  No fever/chils.  Feels leg swelling better today.  No other acute complaints.   Disposition Plan & Communication   Status is: Inpatient  Inpatient status appropriate due to severity of illness requiring IV therapies as outlined above.  Dispo: The patient is from: home              Anticipated d/c is to: home              Patient currently not medically stable for d/c   Difficult to place patient No  Consults, Procedures, Significant Events   Consultants:   Cardiology  Procedures:   None  Antimicrobials:  Anti-infectives (From admission, onward)   Start     Dose/Rate Route Frequency Ordered Stop   10/22/20 1900  azithromycin (ZITHROMAX) 500 mg in sodium chloride 0.9 % 250 mL IVPB        500 mg 250 mL/hr over 60 Minutes Intravenous Every 24 hours 10/21/20 2124     10/22/20 1800  cefTRIAXone (ROCEPHIN) 1 g in sodium chloride 0.9 % 100 mL IVPB        1 g 200 mL/hr over 30 Minutes Intravenous Every 24 hours 10/21/20 2124     10/21/20 1730  cefTRIAXone (ROCEPHIN) 1 g in sodium chloride 0.9 % 100 mL IVPB        1 g 200 mL/hr over 30 Minutes Intravenous  Once 10/21/20 1724 10/21/20 1853   10/21/20 1730  azithromycin (ZITHROMAX) 500 mg in sodium chloride 0.9 % 250 mL IVPB        500 mg 250 mL/hr over 60 Minutes Intravenous  Once 10/21/20 1724 10/21/20 2005        Micro    Objective   Vitals:   10/22/20 0437 10/22/20 0806 10/22/20 1043 10/22/20 1509  BP: (!) 106/50 (!) 104/50 (!) 112/49 (!) 106/52  Pulse: 83 70 78 72  Resp: 20 16 20 20   Temp: 99.1 F (37.3 C) 98.6 F (37 C) 98.2 F (36.8 C) 99.2 F (37.3 C)  TempSrc: Oral Oral Oral Oral  SpO2: 96% (!) 89% 91% 90%  Weight:      Height:        Intake/Output Summary (Last 24 hours) at 10/22/2020 1541 Last data filed at 10/22/2020 1300 Gross per 24 hour  Intake 1494.35 ml  Output 1400 ml  Net 94.35 ml   Filed Weights   10/21/20 1721 10/21/20 2102  Weight: 73.5 kg 75.3 kg     Physical Exam:  General exam: awake, alert, no acute distress HEENT: atraumatic, clear conjunctiva, anicteric sclera, moist mucus membranes, hearing grossly normal  Respiratory system: CTAB, no wheezes, rales or rhonchi, normal respiratory effort, on room air. Cardiovascular system: normal S1/S2, RRR Gastrointestinal system: soft, NT, ND Central nervous system: A&Ox4, no gross focal neurologic deficits, normal speech Extremities: trace to 1+ lower extremity edema bilaterally, moves all extremities, normal tone, no cyanosis Skin: dry, intact, normal temperature, normal color, no rashes seen on visualized skin Psychiatry: normal mood, congruent affect, judgement and insight appear normal  Labs  Data Reviewed: I have personally reviewed following labs and imaging studies  CBC: Recent Labs  Lab 10/20/20 0409 10/21/20 1737 10/22/20 0018  WBC 6.1 13.3* 10.3  NEUTROABS  --  10.8* 7.1  HGB 9.3* 9.7* 9.0*  HCT 29.5* 29.6* 27.5*  MCV 87.3 85.8 85.4  PLT 129* 141* 893*   Basic Metabolic Panel: Recent Labs  Lab 10/18/20 1419 10/19/20 0819 10/20/20 0409 10/21/20 1737 10/22/20 0018  NA 134*  --  134* 128* 130*  K 4.5  --  4.2 4.0 3.5  CL 100  --  107 100 100  CO2 26  --  23 20* 22  GLUCOSE 135*  --  115* 177* 119*  BUN 14 14 11 14 15   CREATININE 0.94 0.93 0.93 0.89 0.82  0.81  CALCIUM 9.2  --  8.1* 8.6* 8.0*   GFR: Estimated Creatinine Clearance: 48.1 mL/min (by C-G formula based on SCr of 0.82 mg/dL). Liver Function Tests: Recent Labs  Lab 10/21/20 1737  AST 29  ALT 12  ALKPHOS 38  BILITOT 1.2  PROT 6.6  ALBUMIN 3.6   No results for input(s): LIPASE, AMYLASE in the last 168 hours. No results for input(s): AMMONIA in the last 168 hours. Coagulation Profile: Recent Labs  Lab 10/21/20 1739  INR 1.1   Cardiac Enzymes: No results for input(s): CKTOTAL, CKMB, CKMBINDEX, TROPONINI in the last 168 hours. BNP (last 3 results) No results for input(s): PROBNP in  the last 8760 hours. HbA1C: No results for input(s): HGBA1C in the last 72 hours. CBG: Recent Labs  Lab 10/19/20 1104  GLUCAP 93   Lipid Profile: No results for input(s): CHOL, HDL, LDLCALC, TRIG, CHOLHDL, LDLDIRECT in the last 72 hours. Thyroid Function Tests: No results for input(s): TSH, T4TOTAL, FREET4, T3FREE, THYROIDAB in the last 72 hours. Anemia Panel: No results for input(s): VITAMINB12, FOLATE, FERRITIN, TIBC, IRON, RETICCTPCT in the last 72 hours. Sepsis Labs: Recent Labs  Lab 10/21/20 1737 10/22/20 0018  LATICACIDVEN 1.6 0.9    Recent Results (from the past 240 hour(s))  SARS CORONAVIRUS 2 (TAT 6-24 HRS) Nasopharyngeal Nasopharyngeal Swab     Status: None   Collection Time: 10/17/20  8:22 AM   Specimen: Nasopharyngeal Swab  Result Value Ref Range Status   SARS Coronavirus 2 NEGATIVE NEGATIVE Final    Comment: (NOTE) SARS-CoV-2 target nucleic acids are NOT DETECTED.  The SARS-CoV-2 RNA is generally detectable in upper and lower respiratory specimens during the acute phase of infection. Negative results do not preclude SARS-CoV-2 infection, do not rule out co-infections with other pathogens, and should not be used as the sole basis for treatment or other patient management decisions. Negative results must be combined with clinical observations, patient history, and epidemiological information. The expected result is Negative.  Fact Sheet for Patients: SugarRoll.be  Fact Sheet for Healthcare Providers: https://www.woods-mathews.com/  This test is not yet approved or cleared by the [place] FDA and  has been authorized for detection and/or diagnosis of SARS-CoV-2 by FDA under an Emergency Use Authorization (EUA). This EUA will remain  in effect (meaning this test can be used) for the duration of the COVID-19 declaration under Se ction 564(b)(1) of the Act, 21 U.S.C. section 360bbb-3(b)(1), unless the  authorization is terminated or revoked sooner.  Performed at [facility], [place] [address], [place], [place] [address]   MRSA PCR Screening     Status: None   Collection Time: 10/19/20 11:31 AM   Specimen: Nasal Mucosa; Nasopharyngeal  Result Value Ref Range Status   MRSA by PCR NEGATIVE NEGATIVE Final    Comment:        The GeneXpert MRSA Assay (FDA approved for NASAL specimens only), is one component of a comprehensive MRSA colonization surveillance program. It is not intended to diagnose MRSA infection nor to guide or monitor treatment for MRSA infections. Performed at [facility], [address] [place], [place] [address]   Blood culture (routine x 2)     Status: None (Preliminary result)   Collection Time: 10/21/20  5:37 PM   Specimen: BLOOD  Result Value Ref Range Status   Specimen Description BLOOD LEFT ANTECUBITAL  Final   Special Requests   Final    BOTTLES DRAWN AEROBIC AND ANAEROBIC Blood Culture adequate volume   Culture   Final    NO GROWTH < 12 HOURS Performed at [facility], [address], [place], [place] [address]    Report Status PENDING  Incomplete  Resp Panel by RT-PCR (Flu A&B, Covid) Nasopharyngeal Swab     Status: None   Collection Time: 10/21/20  5:37 PM   Specimen: Nasopharyngeal Swab; Nasopharyngeal(NP) swabs in vial transport medium  Result Value Ref Range Status   SARS Coronavirus 2 by RT PCR NEGATIVE NEGATIVE Final    Comment: (NOTE) SARS-CoV-2 target nucleic acids are NOT DETECTED.  The SARS-CoV-2 RNA is generally detectable in upper respiratory specimens during the acute phase of infection. The lowest concentration of SARS-CoV-2 viral copies this assay can detect is 138 copies/mL. A negative result does not preclude SARS-Cov-2 infection and should not be used as the sole basis for treatment or other patient management decisions. A negative result may occur with  improper specimen collection/handling,  submission of specimen other than nasopharyngeal swab, presence of viral mutation(s) within the areas targeted by this assay, and inadequate number of viral copies(<138 copies/mL). A negative result must be combined with clinical observations, patient history, and epidemiological information. The expected result is Negative.  Fact Sheet for Patients:  EntrepreneurPulse.com.au  Fact Sheet for Healthcare Providers:  IncredibleEmployment.be  This test is no t yet approved or cleared by the [place] FDA and  has been authorized for detection and/or diagnosis of SARS-CoV-2 by FDA under an Emergency Use Authorization (EUA). This EUA will remain  in effect (meaning this test can be used) for the duration of the COVID-19 declaration under Section 564(b)(1) of the Act, 21 U.S.C.section 360bbb-3(b)(1), unless the authorization is terminated  or revoked sooner.       Influenza A by PCR NEGATIVE NEGATIVE Final   Influenza B by PCR NEGATIVE NEGATIVE Final    Comment: (NOTE) The Xpert Xpress SARS-CoV-2/FLU/RSV plus assay is intended as an aid in the diagnosis of influenza from Nasopharyngeal swab specimens and should not be used as a sole basis for treatment. Nasal washings and aspirates are unacceptable for Xpert Xpress SARS-CoV-2/FLU/RSV testing.  Fact Sheet for Patients: EntrepreneurPulse.com.au  Fact Sheet for Healthcare Providers: IncredibleEmployment.be  This test is not yet approved or cleared by the [place] FDA and has been authorized for detection and/or diagnosis of SARS-CoV-2 by FDA under an Emergency Use Authorization (EUA). This EUA will remain in effect (meaning this test can be used) for the duration of the COVID-19 declaration under Section 564(b)(1) of the Act, 21 U.S.C. section 360bbb-3(b)(1), unless the authorization is terminated or revoked.  Performed at [facility], [address], [place], [place] [address]   Blood culture (routine x 2)  Status: None (Preliminary result)   Collection Time: 10/21/20  5:39 PM   Specimen: BLOOD  Result Value Ref Range Status   Specimen Description BLOOD BLOOD RIGHT FOREARM  Final   Special Requests   Final    BOTTLES DRAWN AEROBIC AND ANAEROBIC Blood Culture adequate volume   Culture   Final    NO GROWTH < 12 HOURS Performed at [facility], [address] [place], [place] [address]    Report Status PENDING  Incomplete      Imaging Studies   DG Chest 2 View  Result Date: 10/21/2020 CLINICAL DATA:  Cough for 3 weeks. Shortness of breath. Recent carotid stent placement, discharged yesterday. Concern for pneumonia EXAM: CHEST - 2 VIEW COMPARISON:  Most recent radiograph 11/08/2018. FINDINGS: Upper normal heart size. Unchanged mediastinal contours. Aortic atherosclerosis. There is a retrocardiac hiatal hernia. Small symmetric bilateral pleural effusions. There is vascular congestion and suggestion of mild pulmonary edema. Slightly more patchy opacity in the right perihilar lung. No pneumothorax. Left humeral arthroplasty in place. IMPRESSION: 1. Small bilateral pleural effusions. Borderline cardiomegaly with vascular congestion and suggestion of mild pulmonary edema. Findings suggestive of fluid overload/mild CHF. 2. Slightly more patchy opacity in the right perihilar lung, may be atelectasis or pneumonia. Electronically Signed   By: Keith Rake M.D.   On: 10/21/2020 18:17   CT Chest Wo Contrast  Result Date: 10/21/2020 CLINICAL DATA:  Pneumonia, effusion or abscess suspected, xray done Shortness of breath since this morning.  Cough for 3 weeks. EXAM: CT CHEST WITHOUT CONTRAST TECHNIQUE: Multidetector CT imaging of the chest was performed following the standard protocol without IV contrast. COMPARISON:  Radiograph earlier today.  Chest CTA 06/10/2017 FINDINGS: Cardiovascular: Upper normal heart size. Aortic  atherosclerosis. No aortic aneurysm. Mild aortic tortuosity. There are coronary artery calcifications. Mitral annulus and aortic valvular calcifications. Trace pericardial fluid. Mediastinum/Nodes: Multiple prominent mediastinal nodes largest measuring 10 mm in the subcarinal station. Limited assessment for hilar adenopathy on this noncontrast exam. Moderate-sized hiatal hernia. Lungs/Pleura: Small bilateral pleural effusions, right greater than left. Minimal fluid in the fissures. Patchy perihilar ground-glass opacity in the right lung involves the upper, middle, and lower lobe. Few nodular areas in the area of ground-glass opacity, including a partially cavitary right lower lobe nodule measuring 8 mm, series 3, image 77. Mild biapical pleuroparenchymal scarring. There is minimal basilar septal thickening which may represent mild pulmonary edema. Mild peribronchial thickening. Upper Abdomen: Moderate hiatal hernia. Stent in the right renal artery. Abdominal vascular calcifications. Diverticulosis noted of the splenic flexure. Musculoskeletal: There are no acute or suspicious osseous abnormalities. Left humeral arthroplasty. IMPRESSION: 1. Patchy perihilar ground-glass opacity in the right lung consistent with pneumonia. Few nodular areas in the area of ground-glass opacity, including a partially cavitary right lower lobe nodule measuring 8 mm, likely part of underlying infectious process. 2. Small bilateral pleural effusions, right greater than left. Minimal basilar septal thickening may represent mild pulmonary edema. 3. Prominent mediastinal nodes are likely reactive. 4. Coronary artery calcifications. Mitral annulus and aortic valvular calcifications. Aortic atherosclerosis. 5. Moderate hiatal hernia. Aortic Atherosclerosis (ICD10-I70.0). Electronically Signed   By: Keith Rake M.D.   On: 10/21/2020 20:06     Medications   Scheduled Meds: . clopidogrel  75 mg Oral Daily  . enoxaparin (LOVENOX)  injection  40 mg Subcutaneous Q24H  . furosemide  40 mg Intravenous BID  . levothyroxine  88 mcg Oral QHS  . sodium chloride flush  3 mL Intravenous Q12H  .  temazepam  15 mg Oral QHS   Continuous Infusions: . sodium chloride    . azithromycin    . cefTRIAXone (ROCEPHIN)  IV         LOS: 1 day    Time spent: 30 minutes    Ezekiel Slocumb, DO [facility]  10/22/2020, 3:41 PM      If 7PM-7AM, please contact night-coverage. How to contact the [facility] Attending or Consulting Rian Koon [place] or covering Sherol Sabas during after hours [place], for this patient?    1. Check the care team in [facility] and look for a) attending/consulting [facility] Nishika Parkhurst listed and b) the [facility] team listed 2. Log into [email or web address] and use [place]'s universal password to access. If you do not have the password, please contact the hospital operator. 3. Locate the [facility] Fritz Cauthon you are looking for under [facility] and page to a number that you can be directly reached. 4. If you still have difficulty reaching the Lacey Wallman, please page the [facility] (Director on Call) for the Hospitalists listed on amion for assistance.

## 2020-10-23 DIAGNOSIS — J189 Pneumonia, unspecified organism: Secondary | ICD-10-CM | POA: Diagnosis not present

## 2020-10-23 DIAGNOSIS — J9601 Acute respiratory failure with hypoxia: Secondary | ICD-10-CM | POA: Diagnosis not present

## 2020-10-23 DIAGNOSIS — I2510 Atherosclerotic heart disease of native coronary artery without angina pectoris: Secondary | ICD-10-CM | POA: Diagnosis not present

## 2020-10-23 DIAGNOSIS — I672 Cerebral atherosclerosis: Secondary | ICD-10-CM | POA: Diagnosis not present

## 2020-10-23 DIAGNOSIS — E871 Hypo-osmolality and hyponatremia: Secondary | ICD-10-CM | POA: Diagnosis not present

## 2020-10-23 LAB — BASIC METABOLIC PANEL
Anion gap: 7 (ref 5–15)
BUN: 23 mg/dL (ref 8–23)
CO2: 23 mmol/L (ref 22–32)
Calcium: 8.2 mg/dL — ABNORMAL LOW (ref 8.9–10.3)
Chloride: 98 mmol/L (ref 98–111)
Creatinine, Ser: 0.9 mg/dL (ref 0.44–1.00)
GFR, Estimated: >60 mL/min (ref >60)
Glucose, Bld: 110 mg/dL — ABNORMAL HIGH (ref 70–99)
Potassium: 3.4 mmol/L — ABNORMAL LOW (ref 3.5–5.1)
Sodium: 128 mmol/L — ABNORMAL LOW (ref 135–145)

## 2020-10-23 LAB — OSMOLALITY: Osmolality: 281 mOsm/kg (ref 275–295)

## 2020-10-23 LAB — HEPARIN LEVEL (UNFRACTIONATED)
Heparin Unfractionated: 0.48 IU/mL (ref 0.30–0.70)
Heparin Unfractionated: 0.32 IU/mL (ref 0.30–0.70)

## 2020-10-23 LAB — EXPECTORATED SPUTUM ASSESSMENT W GRAM STAIN, RFLX TO RESP C

## 2020-10-23 LAB — MAGNESIUM: Magnesium: 1.9 mg/dL (ref 1.7–2.4)

## 2020-10-23 LAB — PROCALCITONIN: Procalcitonin: <0.1 ng/mL

## 2020-10-23 LAB — OSMOLALITY, URINE: Osmolality, Ur: 334 mOsm/kg (ref 300–900)

## 2020-10-23 MED ORDER — SODIUM CHLORIDE 0.9 % IV SOLN
3.0000 g | Freq: Four times a day (QID) | INTRAVENOUS | Status: DC
  Administered 2020-10-23 – 2020-10-24 (×4): 3 g via INTRAVENOUS
  Filled 2020-10-23 (×3): qty 8
  Filled 2020-10-23 (×3): qty 3
  Filled 2020-10-23: qty 8

## 2020-10-23 MED ORDER — POTASSIUM CHLORIDE CRYS ER 20 MEQ PO TBCR
20.0000 meq | EXTENDED_RELEASE_TABLET | Freq: Every day | ORAL | Status: DC
  Administered 2020-10-24: 20 meq via ORAL
  Filled 2020-10-23: qty 1

## 2020-10-23 MED ORDER — GUAIFENESIN-DM 100-10 MG/5ML PO SYRP
5.0000 mL | ORAL_SOLUTION | ORAL | Status: DC | PRN
  Administered 2020-10-23 – 2020-10-24 (×2): 5 mL via ORAL
  Filled 2020-10-23 (×2): qty 5

## 2020-10-23 MED ORDER — POTASSIUM CHLORIDE CRYS ER 20 MEQ PO TBCR
40.0000 meq | EXTENDED_RELEASE_TABLET | ORAL | Status: AC
  Administered 2020-10-23 (×2): 40 meq via ORAL
  Filled 2020-10-23 (×2): qty 2

## 2020-10-23 MED ORDER — FUROSEMIDE 20 MG PO TABS
20.0000 mg | ORAL_TABLET | Freq: Every day | ORAL | Status: DC
  Administered 2020-10-24: 20 mg via ORAL
  Filled 2020-10-23: qty 1

## 2020-10-23 MED ORDER — DM-GUAIFENESIN ER 30-600 MG PO TB12
1.0000 | ORAL_TABLET | Freq: Two times a day (BID) | ORAL | Status: DC
  Administered 2020-10-23 – 2020-10-24 (×3): 1 via ORAL
  Filled 2020-10-23 (×3): qty 1

## 2020-10-23 NOTE — Progress Notes (Signed)
Pharmacy Antibiotic Note  Kathryn Wise is a 87 y.o. female admitted on 10/21/2020  post-op from recent left carotid stenting now with pneumonia and CHF.  Pharmacy has been consulted for Unasyn dosing. Her renal function is stable and at apparent baseline level.  Plan: start Unasyn 3 grams IV every 6 hours   Height: 5\' 5"  (165.1 cm) Weight: 72.1 kg (158 lb 15.2 oz) IBW/kg (Calculated) : 57  Temp (24hrs), Avg:98.6 F (37 C), Min:97.8 F (36.6 C), Max:99.5 F (37.5 C)  Recent Labs  Lab 10/19/20 0819 10/20/20 0409 10/21/20 1737 10/22/20 0018 10/23/20 0136  WBC  --  6.1 13.3* 10.3  --   CREATININE 0.93 0.93 0.89 0.82  0.81 0.90  LATICACIDVEN  --   --  1.6 0.9  --     Estimated Creatinine Clearance: 43 mL/min (by C-G formula based on SCr of 0.9 mg/dL).    Allergies  Allergen Reactions  . Alendronate Swelling    And nausea    . Lisinopril     Spike in BP  . Metoprolol Other (See Comments)    Lethargy - PT CURRENTLY TAKING   . Oxycodone     Other reaction(s): Other (See Comments) Altered mental status  . Oxycontin [Oxycodone Hcl]     Altered mental status    Antimicrobials this admission: Unasyn 3/27 >>   Microbiology results: 3/25 BCx: NG x 2 days 3/25 Sputum: pending  3/25 SARS CoV-2: negative 3/25 influenza A/B: negative  Thank you for allowing pharmacy to be a part of this patient's care.  Dallie Piles 10/23/2020 8:47 AM

## 2020-10-23 NOTE — Progress Notes (Addendum)
Progress Note  Patient Name: ACHSAH MCQUADE Date of Encounter: 10/23/2020  [facility] Cardiologist: Remotely seen by End in 2018  Subjective   UOP 2.9L. Restarted on IV heparin due to concern with EKG. She denies chest pain. Breathing is better but still coughing. Lower leg edema improved but she still feels abdomen is full.   Inpatient Medications    Scheduled Meds: . clopidogrel  75 mg Oral Daily  . furosemide  40 mg Intravenous BID  . levothyroxine  88 mcg Oral QHS  . potassium chloride  40 mEq Oral Q4H  . sodium chloride flush  3 mL Intravenous Q12H  . temazepam  15 mg Oral QHS   Continuous Infusions: . sodium chloride    . ampicillin-sulbactam (UNASYN) IV    . heparin 1,100 Units/hr (10/22/20 1727)   PRN Meds: sodium chloride, acetaminophen, ondansetron (ZOFRAN) IV, sodium chloride flush   Vital Signs    Vitals:   10/22/20 1949 10/22/20 2319 10/23/20 0425 10/23/20 0808  BP: (!) 104/53 111/63 (!) 134/54 (!) 119/50  Pulse: 79 70 (!) 53 77  Resp: 20 18 18 18   Temp: 99.5 F (37.5 C) 97.8 F (36.6 C) 98.5 F (36.9 C) 98.3 F (36.8 C)  TempSrc: Oral Oral Oral Oral  SpO2: 91% 96% 95% 91%  Weight:   72.1 kg   Height:   5\' 5"  (1.651 m)     Intake/Output Summary (Last 24 hours) at 10/23/2020 1001 Last data filed at 10/23/2020 0840 Gross per 24 hour  Intake 590.87 ml  Output 3400 ml  Net -2809.13 ml   Last 3 Weights 10/23/2020 10/21/2020 10/21/2020  Weight (lbs) 158 lb 15.2 oz 166 lb 162 lb  Weight (kg) 72.1 kg 75.297 kg 73.483 kg      Telemetry    NSR, frequent PACs, HR 70-80s - Personally Reviewed  ECG    No new tracing - Personally Reviewed  Physical Exam   GEN: No acute distress.   Neck: No JVD Cardiac: RRR,  2/6 harsh systolic murmur at LSB.   Likely due to her dynamic LVOT gradient   Respiratory: Clear to auscultation bilaterally. GI: hard  MS: No edema; No deformity. Neuro:  Nonfocal  Psych: Normal affect   Labs    High Sensitivity  Troponin:   Recent Labs  Lab 10/21/20 1737 10/22/20 0842  TROPONINIHS 976* 545*      Chemistry Recent Labs  Lab 10/21/20 1737 10/22/20 0018 10/23/20 0136  NA 128* 130* 128*  K 4.0 3.5 3.4*  CL 100 100 98  CO2 20* 22 23  GLUCOSE 177* 119* 110*  BUN 14 15 23   CREATININE 0.89 0.82  0.81 0.90  CALCIUM 8.6* 8.0* 8.2*  PROT 6.6  --   --   ALBUMIN 3.6  --   --   AST 29  --   --   ALT 12  --   --   ALKPHOS 38  --   --   BILITOT 1.2  --   --   GFRNONAA >60 >60  >60 >60  ANIONGAP 8 8 7      Hematology Recent Labs  Lab 10/20/20 0409 10/21/20 1737 10/22/20 0018  WBC 6.1 13.3* 10.3  RBC 3.38* 3.45* 3.22*  HGB 9.3* 9.7* 9.0*  HCT 29.5* 29.6* 27.5*  MCV 87.3 85.8 85.4  MCH 27.5 28.1 28.0  MCHC 31.5 32.8 32.7  RDW 15.0 15.0 15.1  PLT 129* 141* 133*    BNP Recent Labs  Lab 10/21/20  1737  BNP 966.7*     DDimer No results for input(s): DDIMER in the last 168 hours.   Radiology    DG Chest 2 View  Result Date: 10/21/2020 CLINICAL DATA:  Cough for 3 weeks. Shortness of breath. Recent carotid stent placement, discharged yesterday. Concern for pneumonia EXAM: CHEST - 2 VIEW COMPARISON:  Most recent radiograph 11/08/2018. FINDINGS: Upper normal heart size. Unchanged mediastinal contours. Aortic atherosclerosis. There is a retrocardiac hiatal hernia. Small symmetric bilateral pleural effusions. There is vascular congestion and suggestion of mild pulmonary edema. Slightly more patchy opacity in the right perihilar lung. No pneumothorax. Left humeral arthroplasty in place. IMPRESSION: 1. Small bilateral pleural effusions. Borderline cardiomegaly with vascular congestion and suggestion of mild pulmonary edema. Findings suggestive of fluid overload/mild CHF. 2. Slightly more patchy opacity in the right perihilar lung, may be atelectasis or pneumonia. Electronically Signed   By: Keith Rake M.D.   On: 10/21/2020 18:17   CT Chest Wo Contrast  Result Date: 10/21/2020 CLINICAL  DATA:  Pneumonia, effusion or abscess suspected, xray done Shortness of breath since this morning.  Cough for 3 weeks. EXAM: CT CHEST WITHOUT CONTRAST TECHNIQUE: Multidetector CT imaging of the chest was performed following the standard protocol without IV contrast. COMPARISON:  Radiograph earlier today.  Chest CTA 06/10/2017 FINDINGS: Cardiovascular: Upper normal heart size. Aortic atherosclerosis. No aortic aneurysm. Mild aortic tortuosity. There are coronary artery calcifications. Mitral annulus and aortic valvular calcifications. Trace pericardial fluid. Mediastinum/Nodes: Multiple prominent mediastinal nodes largest measuring 10 mm in the subcarinal station. Limited assessment for hilar adenopathy on this noncontrast exam. Moderate-sized hiatal hernia. Lungs/Pleura: Small bilateral pleural effusions, right greater than left. Minimal fluid in the fissures. Patchy perihilar ground-glass opacity in the right lung involves the upper, middle, and lower lobe. Few nodular areas in the area of ground-glass opacity, including a partially cavitary right lower lobe nodule measuring 8 mm, series 3, image 77. Mild biapical pleuroparenchymal scarring. There is minimal basilar septal thickening which may represent mild pulmonary edema. Mild peribronchial thickening. Upper Abdomen: Moderate hiatal hernia. Stent in the right renal artery. Abdominal vascular calcifications. Diverticulosis noted of the splenic flexure. Musculoskeletal: There are no acute or suspicious osseous abnormalities. Left humeral arthroplasty. IMPRESSION: 1. Patchy perihilar ground-glass opacity in the right lung consistent with pneumonia. Few nodular areas in the area of ground-glass opacity, including a partially cavitary right lower lobe nodule measuring 8 mm, likely part of underlying infectious process. 2. Small bilateral pleural effusions, right greater than left. Minimal basilar septal thickening may represent mild pulmonary edema. 3. Prominent  mediastinal nodes are likely reactive. 4. Coronary artery calcifications. Mitral annulus and aortic valvular calcifications. Aortic atherosclerosis. 5. Moderate hiatal hernia. Aortic Atherosclerosis (ICD10-I70.0). Electronically Signed   By: Keith Rake M.D.   On: 10/21/2020 20:06    Cardiac Studies   Echo ordered  Echo 2018 Study Conclusions   - Left ventricle: The cavity size was normal. Near cavity  obliteration in systole. There was mild focal basal and mild  concentric hypertrophy of the septum. Systolic function was  normal. The estimated ejection fraction was in the range of 55%  to 60%. Wall motion was normal; there were no regional wall  motion abnormalities. Doppler parameters are consistent with  abnormal left ventricular relaxation (grade 1 diastolic  dysfunction).  - Aortic valve: Transvalvular velocity was increased. There was  very mild stenosis.  - Mitral valve: Calcified annulus. There was moderate  regurgitation.  - Left atrium: The atrium was mildly  dilated.  - Right ventricle: Systolic function was normal.  - Pulmonary arteries: Systolic pressure was moderately elevated. PA  peak pressure: 50 mm Hg (S).    Cardiac cath 05/2017  Mid RCA lesion is 50% stenosed.  Prox Cx to Mid Cx lesion is 55% stenosed.  Ost LM to Mid LM lesion is 30% stenosed.  The left ventricular ejection fraction is greater than 65% by visual estimate.  LV end diastolic pressure is normal.  There is hyperdynamic left ventricular systolic function.  Final Conclusions:  Moderate mid LCX and RCA disease, Medical management recommended Normal/hyperdynamic function  Recommendations:  Follow up on echocardiogram Start metoprolol tartrate 25 mg po BID   Patient Profile     87 y.o. female with a hx of AS, HTN, polymyalgia rheumatica, HLD, hypothyroidism, nonosbtructive CAD, PAD, renal artery stenosis s/p left stenting 12/2018, carotid artery disease s/p  recent left stent who is being seen today for the evaluation of CHF.  Assessment & Plan    Acute hypoxic respiratory failure due to PNA and CHF - in the setting of post-op from recent left carotid stenting found to have pna and acute CHF - elevated BNP and troponin>>echo ordered - supplemental O2>>now off - Iv lasix and IV abx per IM - patient feels breathing is much better  Elevated troponin - troponin elevated 545> 976. Patient denies anginal symptoms - LHC in 2018 with nonobstructive disease - echo ordered - IV heparin started. Given that patient was asymptomatic heparin was d/c'd however with concern for EKG it was restarted. LBBB is old, likely OK to discontinue IV heparin.  - If echo wnl further work-up as OP - On plavix for PAD - continue statin. Can consider BB in OP  Acute diastolic CHF - BNP elevated and imaging with possible edema - Echo ordered. Prior echo showed LVEF 55-60%, G1DD, mod MR, mild AS - IV lasix 40 mg BID - PTA lasix 20mg  daily since a month ago. - UOP -2.9L, net -2.7L  - Weight 158lbs, Patient reports dry weight around 152lbs. - creatinine stable - LLE edema improving, however she still feels abdomen is full. Can likely switch to oral lasix   Carotid artery disease s/p recent left stenting - per VVS - plavix, statin  HLD - continue rosuvastatin - LDL goal <70  HTN  - PTA amlodipine 5mg  daily and telmisartan (not continued on admission) - pressures soft overnight - would consider low dose BB for CAD/CHF  Mod MR Mild AS - echo ordered as above  For questions or updates, please contact [facility] Please consult [email or web address] for contact info under        Signed, Cadence Ninfa Meeker, PA-C  10/23/2020, 10:01 AM    Attending Note:   The patient was seen and examined.  Agree with assessment and plan as noted above.  Changes made to the above note as needed.  Patient seen and independently examined with Cadence Kathlen Mody, PA .   We  discussed all aspects of the encounter. I agree with the assessment and plan as stated above.  1.   Respiratory failure- likely due to pneumonia with transient CHF Improving ,  Feeling much better   2. Elevated troponin :  Troponin 976 - 545. I was notified about possible ST changes by secure chat.   We started heparin . In reviewing her ECG , she has a LBBB which is responsible for the ST changes.  She is completely pain free. Will DC heparin .  3.  HTN:    BP is low / well controlled here in the hospital  She is not requiring the amlodipine or telmisartan. I suspect her diet may be lower in salt here in the hospital compared to her home diet .  4.  CHF :   Likely had transient diastolic CHF related to her pneumonia.  Echo tech has not been available this weekend She is doing great and can get the echo as an OP if needed.  If she needs to remain in the hospital for more days for IV abx, she might be able to get her echo without prolonging her stay . Will change her lasix to 20 mg po QD starting tomorrow Kdur 20 meq daily starting tomorrow    I have spent a total of 40 minutes with patient reviewing hospital  notes , telemetry, EKGs, labs and examining patient as well as establishing an assessment and plan that was discussed with the patient. > 50% of time was spent in direct patient care.    Thayer Headings, Brooke Bonito., MD, [facility] 10/23/2020, 10:40 AM [address]  [place] Pager [phone]

## 2020-10-23 NOTE — Consult Note (Signed)
[place] for heparin Indication: chest pain/ACS  Allergies  Allergen Reactions  . Alendronate Swelling    And nausea    . Lisinopril     Spike in BP  . Metoprolol Other (See Comments)    Lethargy - PT CURRENTLY TAKING   . Oxycodone     Other reaction(s): Other (See Comments) Altered mental status  . Oxycontin [Oxycodone Hcl]     Altered mental status    Patient Measurements: Height: 5\' 5"  (165.1 cm) Weight: 72.1 kg (158 lb 15.2 oz) IBW/kg (Calculated) : 57 Heparin Dosing Weight: 73.5  Vital Signs: Temp: 98.3 F (36.8 C) (03/27 0808) Temp Source: Oral (03/27 0808) BP: 119/50 (03/27 0808) Pulse Rate: 77 (03/27 0808)  Labs: Recent Labs    10/21/20 1737 10/21/20 1739 10/22/20 0018 10/22/20 0704 10/22/20 0842 10/23/20 0136 10/23/20 0935  HGB 9.7*  --  9.0*  --   --   --   --   HCT 29.6*  --  27.5*  --   --   --   --   PLT 141*  --  133*  --   --   --   --   APTT  --  33  --   --   --   --   --   LABPROT  --  14.0  --   --   --   --   --   INR  --  1.1  --   --   --   --   --   HEPARINUNFRC  --   --   --  0.18*  --  0.48 0.32  CREATININE 0.89  --  0.82  0.81  --   --  0.90  --   TROPONINIHS 976*  --   --   --  545*  --   --     Estimated Creatinine Clearance: 43 mL/min (by C-G formula based on SCr of 0.9 mg/dL).   Medical History: Past Medical History:  Diagnosis Date  . Arthritis   . Collagen vascular disease ([place])   . Dysphagia, pharyngoesophageal phase 09/10/2013   Upper GI study with barium swallow was done at  [facility] Mar 2015   No reflux seen  Small irreducible hiatal hernia  Mild changes of presbyeophagus (abnormal contractions of the esophagus that occur with aging) No strictures Normal gastric emptying  Incomplete visualization of stomach fold due to patient's inability turn    . GERD (gastroesophageal reflux disease)   . Heart murmur    has had years and years  . History of shingles Dec 2013   treated with  steroids , post op from shoulder surgery  . History of squamous cell carcinoma 05/02/2016   right mid lateral pretibial  . Hypertension   . Hypothyroidism   . Major depressive disorder, single episode 11/05/2015  . Osteoarthritis of left shoulder 07/14/2012  . Squamous cell carcinoma of skin 05/12/2016   R mid lat pretibial - other skin cancers treated by Dr. Sharlett Iles  . Squamous cell carcinoma of skin 08/22/2020   left distal medial popliteal - [facility]    Medications:  No anticoagulants prior to admission per chart review  Assessment: 87 yo F presented with new onset SOB with history of cough x 3 weeks. PMH includes GERD, HTN, hypothyroidism, and MDD. Pharmacy has been consulted to dose heparin for ACS/chest pain.  Labs: Hgb 9.7>9.0, plt 141>133  Date Time HL Rate/Comment 3/26 0704 0.18  900 > 1100 un/hr 3/26 1620 --- Hep gtt off 1205-1630; reconsulted for restart 3/27 0136 0.48 Therapeutic x 1 3/27 0935 0.32 Therapeutic x 2, cont 1100 units/hr  Goal of Therapy:  Heparin level 0.3-0.7 units/ml Monitor platelets by anticoagulation protocol: Yes   Plan:  Continue heparin infusion at rate of 1100 units/hr.  CBC not drawn it appears. F/u HL with am labs Continue to monitor H&H and platelets  Chinita Greenland PharmD Clinical Pharmacist 10/23/2020

## 2020-10-23 NOTE — Consult Note (Signed)
[place] for heparin Indication: chest pain/ACS  Allergies  Allergen Reactions  . Alendronate Swelling    And nausea    . Lisinopril     Spike in BP  . Metoprolol Other (See Comments)    Lethargy - PT CURRENTLY TAKING   . Oxycodone     Other reaction(s): Other (See Comments) Altered mental status  . Oxycontin [Oxycodone Hcl]     Altered mental status    Patient Measurements: Height: 5\' 5"  (165.1 cm) Weight: 75.3 kg (166 lb) IBW/kg (Calculated) : 57 Heparin Dosing Weight: 73.5  Vital Signs: Temp: 97.8 F (36.6 C) (03/26 2319) Temp Source: Oral (03/26 2319) BP: 111/63 (03/26 2319) Pulse Rate: 70 (03/26 2319)  Labs: Recent Labs    10/20/20 0409 10/21/20 1737 10/21/20 1739 10/22/20 0018 10/22/20 0704 10/22/20 0842 10/23/20 0136  HGB 9.3* 9.7*  --  9.0*  --   --   --   HCT 29.5* 29.6*  --  27.5*  --   --   --   PLT 129* 141*  --  133*  --   --   --   APTT  --   --  33  --   --   --   --   LABPROT  --   --  14.0  --   --   --   --   INR  --   --  1.1  --   --   --   --   HEPARINUNFRC  --   --   --   --  0.18*  --  0.48  CREATININE 0.93 0.89  --  0.82  0.81  --   --   --   TROPONINIHS  --  976*  --   --   --  545*  --     Estimated Creatinine Clearance: 48.1 mL/min (by C-G formula based on SCr of 0.82 mg/dL).   Medical History: Past Medical History:  Diagnosis Date  . Arthritis   . Collagen vascular disease ([place])   . Dysphagia, pharyngoesophageal phase 09/10/2013   Upper GI study with barium swallow was done at  [facility] Mar 2015   No reflux seen  Small irreducible hiatal hernia  Mild changes of presbyeophagus (abnormal contractions of the esophagus that occur with aging) No strictures Normal gastric emptying  Incomplete visualization of stomach fold due to patient's inability turn    . GERD (gastroesophageal reflux disease)   . Heart murmur    has had years and years  . History of shingles Dec 2013   treated with  steroids , post op from shoulder surgery  . History of squamous cell carcinoma 05/02/2016   right mid lateral pretibial  . Hypertension   . Hypothyroidism   . Major depressive disorder, single episode 11/05/2015  . Osteoarthritis of left shoulder 07/14/2012  . Squamous cell carcinoma of skin 05/12/2016   R mid lat pretibial - other skin cancers treated by Dr. Sharlett Iles  . Squamous cell carcinoma of skin 08/22/2020   left distal medial popliteal - [facility]    Medications:  No anticoagulants prior to admission per chart review  Assessment: 87 yo F presented with new onset SOB with history of cough x 3 weeks. PMH includes GERD, HTN, hypothyroidism, and MDD. Pharmacy has been consulted to dose heparin for ACS/chest pain.  Labs: Hgb 9.7>9.0, plt 141>133  Date Time HL Rate/Comment 3/26 0704 0.18 900 > 1100 un/hr 3/26 1620 ---  Hep gtt off 1205-1630; reconsulted for restart 3/27 0136 0.48 Therapeutic x 1  Goal of Therapy:  Heparin level 0.3-0.7 units/ml Monitor platelets by anticoagulation protocol: Yes   Plan:  Discontinue prophylactic lovenox and resume heparin starting now.  Continue heparin infusion at rate of 1100 units/hr Recheck HL in 8 hr to confirm, then daily while on heparin Continue to monitor H&H and platelets  Renda Rolls, PharmD, [facility] 10/23/2020 2:07 AM

## 2020-10-23 NOTE — Progress Notes (Addendum)
PROGRESS NOTE    Kathryn Wise   FMB:[id number]  DOB: 05/15/1930  PCP: [name], MD    DOA: 10/21/2020 LOS: 2   Brief Narrative   Kathryn Wise is a 87 y.o. female with medical history significant of GERD, hypertension, hypothyroidism, coronary artery disease, depression, dysphagia and osteoarthritis who was referred to the ED on 10/21/20 from urgent care where she was seen for complaints of cough shortness of breath as well as fever. She'd had carotid stent placement the day before.  Chest xray at urgent care was concerning for PNA and she had a temp of 100.2 F there.  Imaging here confirmed PNA and also showed some pulmonary edema.  Patient admitted at started on antibiotics and diuretics.    She had troponin elevation in the ED, was started on heparin drip.  Cardiology consulted and attributes to demand ischemia, so heparin infusion stopped.  Pt had no chest pain or any signs of ACS.  Patient's respiratory status improved significantly today.       Assessment & Plan   Principal Problem:   Acute respiratory failure with hypoxemia (HCC) Active Problems:   GERD (gastroesophageal reflux disease)   Hypothyroidism   Coronary artery disease   Arteriosclerotic cerebrovascular disease   Hyponatremia   Troponin I above reference range   Pneumonia   Acute respiratory failure with hypoxia -present on admission due to pneumonia and CHF.  Patient presented with shortness of breath and O2 sat 89% on room air. 3/26: Weaned to room air --Oxygen if needed to maintain sats above 90%  Community-acquired pneumonia versus aspiration -patient developed fever and shortness of breath in the 24 hours following her procedure. --Continue empiric antibiotics - changed to Unasyn for anaerobic coverage --Follow cultures --Mucinex BID scheduled and PRN --Incentive spirometer, flutter valve  Hyponatremia -presented with sodium 128.  Suspect multifactorial.  Initially suspect hypervolemic in  the setting of CHF and volume overload.  Na 128>130 diuretic, then 128 today and volume status euvolemic to dry.  She may also SIADH given her respiratory issues.   --sOsm 281, urOsm 334 (on diuretics) --Follow up TSH, uric acid, cortisol, urine urea with Am labs --Monitor BMP daily.   --On fluid restriction as above  Acute on chronic diastolic CHF -patient reported 14 pound weight gain, increased lower extremity swelling and worsening shortness of breath.  This is improved since admission and diuresis.  She also reported not recently taking her diuretic. --Cardiology following, appreciate recommendations --Stop IV diuresis --Placed on oral Lasix 20 mg daily --Follow-up pending echo - can be done outpatient if pt otherwise stable for d/c before obtained --Monitor volume status with strict I/O's Daily weights --Fluid restriction  Elevated troponin -likely due to demand ischemia in the setting of above.  Was started on heparin drip empirically per ACS protocol.  Troponin has trended down and patient is without any chest pain.  Appreciate cardiology's input.   Carotid artery stenosis status post recent left stent -on 3/24 with vascular surgery.  Continue Plavix and statin  Hypertension -takes amlodipine 5 mg and telmisartan at home which are held and patient normotensive.  Continue IV Lasix.  Consider addition of low-dose beta-blocker given her CAD and CHF.  CAD -stable with no active chest pain.  Echo is pending.  Cardiology is following.  PAD -on Plavix  Hyperlipidemia -on statin.  LDL goal under 70  Hypothyroidism -continue levothyroxine  GERD -continue PPI  Thrombocytopenia -appears chronic.  Mild.  Monitor CBC closely given  use of heparin.  Patient BMI: Body mass index is 26.45 kg/m.   DVT prophylaxis:    Diet:  Diet Orders (From admission, onward)    Start     Ordered   10/22/20 0827  Diet Heart Room service appropriate? Yes; Fluid consistency: Thin; Fluid restriction:  1500 mL Fluid  Diet effective now       Question Answer Comment  Room service appropriate? Yes   Fluid consistency: Thin   Fluid restriction: 1500 mL Fluid      10/22/20 0826            Code Status: Full Code    Subjective 10/23/20    Pt seen today, reports feeling better.  Swelling in arms and legs has gone away.  Cough today is more productive.  Denies fever/chills.  No chest pain, SOB, N/V/D or other acute complaints.   Disposition Plan & Communication   Status is: Inpatient  Inpatient status appropriate due to severity of illness requiring IV therapies as outlined above.  Dispo: The patient is from: home              Anticipated d/c is to: home              Patient currently not medically stable for d/c   Difficult to place patient No  Consults, Procedures, Significant Events   Consultants:   Cardiology  Procedures:   None  Antimicrobials:  Anti-infectives (From admission, onward)   Start     Dose/Rate Route Frequency Ordered Stop   10/23/20 1200  Ampicillin-Sulbactam (UNASYN) 3 g in sodium chloride 0.9 % 100 mL IVPB        3 g 200 mL/hr over 30 Minutes Intravenous Every 6 hours 10/23/20 0847     10/22/20 1900  azithromycin (ZITHROMAX) 500 mg in sodium chloride 0.9 % 250 mL IVPB  Status:  Discontinued        500 mg 250 mL/hr over 60 Minutes Intravenous Every 24 hours 10/21/20 2124 10/23/20 0839   10/22/20 1800  cefTRIAXone (ROCEPHIN) 1 g in sodium chloride 0.9 % 100 mL IVPB  Status:  Discontinued        1 g 200 mL/hr over 30 Minutes Intravenous Every 24 hours 10/21/20 2124 10/23/20 0839   10/21/20 1730  cefTRIAXone (ROCEPHIN) 1 g in sodium chloride 0.9 % 100 mL IVPB        1 g 200 mL/hr over 30 Minutes Intravenous  Once 10/21/20 1724 10/21/20 1853   10/21/20 1730  azithromycin (ZITHROMAX) 500 mg in sodium chloride 0.9 % 250 mL IVPB        500 mg 250 mL/hr over 60 Minutes Intravenous  Once 10/21/20 1724 10/21/20 2005        Micro    Objective    Vitals:   10/22/20 2319 10/23/20 0425 10/23/20 0808 10/23/20 1614  BP: 111/63 (!) 134/54 (!) 119/50 112/60  Pulse: 70 (!) 53 77 66  Resp: 18 18 18  (!) 21  Temp: 97.8 F (36.6 C) 98.5 F (36.9 C) 98.3 F (36.8 C) 98.5 F (36.9 C)  TempSrc: Oral Oral Oral Oral  SpO2: 96% 95% 91% 94%  Weight:  72.1 kg    Height:  5\' 5"  (1.651 m)      Intake/Output Summary (Last 24 hours) at 10/23/2020 1726 Last data filed at 10/23/2020 1502 Gross per 24 hour  Intake 444.83 ml  Output 2300 ml  Net -1855.17 ml   Filed Weights   10/21/20 1721 10/21/20 2102  10/23/20 0425  Weight: 73.5 kg 75.3 kg 72.1 kg    Physical Exam:  General exam: awake, alert, no acute distress, pleasant and conversational Respiratory system: Right-sided rhonchi, no wheezes, normal respiratory effort, on room air Cardiovascular system: normal S1/S2, RRR Gastrointestinal system: soft, NT, ND Central nervous system: A&Ox4, no gross focal neurologic deficits, normal speech Extremities: No lower extremity edema, moves all extremities Psychiatry: normal mood, congruent affect, judgement and insight appear normal  Labs   Data Reviewed: I have personally reviewed following labs and imaging studies  CBC: Recent Labs  Lab 10/20/20 0409 10/21/20 1737 10/22/20 0018  WBC 6.1 13.3* 10.3  NEUTROABS  --  10.8* 7.1  HGB 9.3* 9.7* 9.0*  HCT 29.5* 29.6* 27.5*  MCV 87.3 85.8 85.4  PLT 129* 141* 588*   Basic Metabolic Panel: Recent Labs  Lab 10/18/20 1419 10/19/20 0819 10/20/20 0409 10/21/20 1737 10/22/20 0018 10/23/20 0136  NA 134*  --  134* 128* 130* 128*  K 4.5  --  4.2 4.0 3.5 3.4*  CL 100  --  107 100 100 98  CO2 26  --  23 20* 22 23  GLUCOSE 135*  --  115* 177* 119* 110*  BUN 14 14 11 14 15 23   CREATININE 0.94 0.93 0.93 0.89 0.82  0.81 0.90  CALCIUM 9.2  --  8.1* 8.6* 8.0* 8.2*  MG  --   --   --   --   --  1.9   GFR: Estimated Creatinine Clearance: 43 mL/min (by C-G formula based on SCr of 0.9  mg/dL). Liver Function Tests: Recent Labs  Lab 10/21/20 1737  AST 29  ALT 12  ALKPHOS 38  BILITOT 1.2  PROT 6.6  ALBUMIN 3.6   No results for input(s): LIPASE, AMYLASE in the last 168 hours. No results for input(s): AMMONIA in the last 168 hours. Coagulation Profile: Recent Labs  Lab 10/21/20 1739  INR 1.1   Cardiac Enzymes: No results for input(s): CKTOTAL, CKMB, CKMBINDEX, TROPONINI in the last 168 hours. BNP (last 3 results) No results for input(s): PROBNP in the last 8760 hours. HbA1C: No results for input(s): HGBA1C in the last 72 hours. CBG: Recent Labs  Lab 10/19/20 1104  GLUCAP 93   Lipid Profile: No results for input(s): CHOL, HDL, LDLCALC, TRIG, CHOLHDL, LDLDIRECT in the last 72 hours. Thyroid Function Tests: No results for input(s): TSH, T4TOTAL, FREET4, T3FREE, THYROIDAB in the last 72 hours. Anemia Panel: No results for input(s): VITAMINB12, FOLATE, FERRITIN, TIBC, IRON, RETICCTPCT in the last 72 hours. Sepsis Labs: Recent Labs  Lab 10/21/20 1737 10/22/20 0018 10/23/20 0935  PROCALCITON  --   --  <0.10  LATICACIDVEN 1.6 0.9  --     Recent Results (from the past 240 hour(s))  SARS CORONAVIRUS 2 (TAT 6-24 HRS) Nasopharyngeal Nasopharyngeal Swab     Status: None   Collection Time: 10/17/20  8:22 AM   Specimen: Nasopharyngeal Swab  Result Value Ref Range Status   SARS Coronavirus 2 NEGATIVE NEGATIVE Final    Comment: (NOTE) SARS-CoV-2 target nucleic acids are NOT DETECTED.  The SARS-CoV-2 RNA is generally detectable in upper and lower respiratory specimens during the acute phase of infection. Negative results do not preclude SARS-CoV-2 infection, do not rule out co-infections with other pathogens, and should not be used as the sole basis for treatment or other patient management decisions. Negative results must be combined with clinical observations, patient history, and epidemiological information. The expected result is Negative.  Fact  Sheet for Patients: SugarRoll.be  Fact Sheet for Healthcare Providers: https://www.woods-mathews.com/  This test is not yet approved or cleared by the [place] FDA and  has been authorized for detection and/or diagnosis of SARS-CoV-2 by FDA under an Emergency Use Authorization (EUA). This EUA will remain  in effect (meaning this test can be used) for the duration of the COVID-19 declaration under Se ction 564(b)(1) of the Act, 21 U.S.C. section 360bbb-3(b)(1), unless the authorization is terminated or revoked sooner.  Performed at [facility], [place] [address] [place], [place] [address]   MRSA PCR Screening     Status: None   Collection Time: 10/19/20 11:31 AM   Specimen: Nasal Mucosa; Nasopharyngeal  Result Value Ref Range Status   MRSA by PCR NEGATIVE NEGATIVE Final    Comment:        The GeneXpert MRSA Assay (FDA approved for NASAL specimens only), is one component of a comprehensive MRSA colonization surveillance program. It is not intended to diagnose MRSA infection nor to guide or monitor treatment for MRSA infections. Performed at [facility], [address] [place], [place] [address]   Blood culture (routine x 2)     Status: None (Preliminary result)   Collection Time: 10/21/20  5:37 PM   Specimen: BLOOD  Result Value Ref Range Status   Specimen Description BLOOD LEFT ANTECUBITAL  Final   Special Requests   Final    BOTTLES DRAWN AEROBIC AND ANAEROBIC Blood Culture adequate volume   Culture   Final    NO GROWTH 2 DAYS Performed at [facility], [address], [place], [place] [address]    Report Status PENDING  Incomplete  Resp Panel by RT-PCR (Flu A&B, Covid) Nasopharyngeal Swab     Status: None   Collection Time: 10/21/20  5:37 PM   Specimen: Nasopharyngeal Swab; Nasopharyngeal(NP) swabs in vial transport medium  Result Value Ref Range Status   SARS Coronavirus 2 by RT PCR NEGATIVE  NEGATIVE Final    Comment: (NOTE) SARS-CoV-2 target nucleic acids are NOT DETECTED.  The SARS-CoV-2 RNA is generally detectable in upper respiratory specimens during the acute phase of infection. The lowest concentration of SARS-CoV-2 viral copies this assay can detect is 138 copies/mL. A negative result does not preclude SARS-Cov-2 infection and should not be used as the sole basis for treatment or other patient management decisions. A negative result may occur with  improper specimen collection/handling, submission of specimen other than nasopharyngeal swab, presence of viral mutation(s) within the areas targeted by this assay, and inadequate number of viral copies(<138 copies/mL). A negative result must be combined with clinical observations, patient history, and epidemiological information. The expected result is Negative.  Fact Sheet for Patients:  EntrepreneurPulse.com.au  Fact Sheet for Healthcare Providers:  IncredibleEmployment.be  This test is no t yet approved or cleared by the [place] FDA and  has been authorized for detection and/or diagnosis of SARS-CoV-2 by FDA under an Emergency Use Authorization (EUA). This EUA will remain  in effect (meaning this test can be used) for the duration of the COVID-19 declaration under Section 564(b)(1) of the Act, 21 U.S.C.section 360bbb-3(b)(1), unless the authorization is terminated  or revoked sooner.       Influenza A by PCR NEGATIVE NEGATIVE Final   Influenza B by PCR NEGATIVE NEGATIVE Final    Comment: (NOTE) The Xpert Xpress SARS-CoV-2/FLU/RSV plus assay is intended as an aid in the diagnosis of influenza from Nasopharyngeal swab specimens and should not  be used as a sole basis for treatment. Nasal washings and aspirates are unacceptable for Xpert Xpress SARS-CoV-2/FLU/RSV testing.  Fact Sheet for Patients: EntrepreneurPulse.com.au  Fact Sheet for Healthcare  Providers: IncredibleEmployment.be  This test is not yet approved or cleared by the [place] FDA and has been authorized for detection and/or diagnosis of SARS-CoV-2 by FDA under an Emergency Use Authorization (EUA). This EUA will remain in effect (meaning this test can be used) for the duration of the COVID-19 declaration under Section 564(b)(1) of the Act, 21 U.S.C. section 360bbb-3(b)(1), unless the authorization is terminated or revoked.  Performed at [facility], [address] [place], [place] [address]   Blood culture (routine x 2)     Status: None (Preliminary result)   Collection Time: 10/21/20  5:39 PM   Specimen: BLOOD  Result Value Ref Range Status   Specimen Description BLOOD BLOOD RIGHT FOREARM  Final   Special Requests   Final    BOTTLES DRAWN AEROBIC AND ANAEROBIC Blood Culture adequate volume   Culture   Final    NO GROWTH 2 DAYS Performed at [facility], [address], [place], [place] [address]    Report Status PENDING  Incomplete  Expectorated Sputum Assessment w Gram Stain, Rflx to Resp Cult     Status: None   Collection Time: 10/23/20  2:55 PM   Specimen: Expectorated Sputum  Result Value Ref Range Status   Specimen Description EXPECTORATED SPUTUM  Final   Special Requests NONE  Final   Sputum evaluation   Final    Sputum specimen not acceptable for testing.  Please recollect.   [facility] PEREA 10/23/20 AT 6160 BY ACR Performed at [facility], [address] [place], [place] [address]    Report Status 10/23/2020 FINAL  Final      Imaging Studies   DG Chest 2 View  Result Date: 10/21/2020 CLINICAL DATA:  Cough for 3 weeks. Shortness of breath. Recent carotid stent placement, discharged yesterday. Concern for pneumonia EXAM: CHEST - 2 VIEW COMPARISON:  Most recent radiograph 11/08/2018. FINDINGS: Upper normal heart size. Unchanged mediastinal contours. Aortic atherosclerosis. There is a  retrocardiac hiatal hernia. Small symmetric bilateral pleural effusions. There is vascular congestion and suggestion of mild pulmonary edema. Slightly more patchy opacity in the right perihilar lung. No pneumothorax. Left humeral arthroplasty in place. IMPRESSION: 1. Small bilateral pleural effusions. Borderline cardiomegaly with vascular congestion and suggestion of mild pulmonary edema. Findings suggestive of fluid overload/mild CHF. 2. Slightly more patchy opacity in the right perihilar lung, may be atelectasis or pneumonia. Electronically Signed   By: Keith Rake M.D.   On: 10/21/2020 18:17   CT Chest Wo Contrast  Result Date: 10/21/2020 CLINICAL DATA:  Pneumonia, effusion or abscess suspected, xray done Shortness of breath since this morning.  Cough for 3 weeks. EXAM: CT CHEST WITHOUT CONTRAST TECHNIQUE: Multidetector CT imaging of the chest was performed following the standard protocol without IV contrast. COMPARISON:  Radiograph earlier today.  Chest CTA 06/10/2017 FINDINGS: Cardiovascular: Upper normal heart size. Aortic atherosclerosis. No aortic aneurysm. Mild aortic tortuosity. There are coronary artery calcifications. Mitral annulus and aortic valvular calcifications. Trace pericardial fluid. Mediastinum/Nodes: Multiple prominent mediastinal nodes largest measuring 10 mm in the subcarinal station. Limited assessment for hilar adenopathy on this noncontrast exam. Moderate-sized hiatal hernia. Lungs/Pleura: Small bilateral pleural effusions, right greater than left. Minimal fluid in the fissures. Patchy perihilar ground-glass opacity in the right lung involves the upper, middle, and lower lobe. Few  nodular areas in the area of ground-glass opacity, including a partially cavitary right lower lobe nodule measuring 8 mm, series 3, image 77. Mild biapical pleuroparenchymal scarring. There is minimal basilar septal thickening which may represent mild pulmonary edema. Mild peribronchial thickening.  Upper Abdomen: Moderate hiatal hernia. Stent in the right renal artery. Abdominal vascular calcifications. Diverticulosis noted of the splenic flexure. Musculoskeletal: There are no acute or suspicious osseous abnormalities. Left humeral arthroplasty. IMPRESSION: 1. Patchy perihilar ground-glass opacity in the right lung consistent with pneumonia. Few nodular areas in the area of ground-glass opacity, including a partially cavitary right lower lobe nodule measuring 8 mm, likely part of underlying infectious process. 2. Small bilateral pleural effusions, right greater than left. Minimal basilar septal thickening may represent mild pulmonary edema. 3. Prominent mediastinal nodes are likely reactive. 4. Coronary artery calcifications. Mitral annulus and aortic valvular calcifications. Aortic atherosclerosis. 5. Moderate hiatal hernia. Aortic Atherosclerosis (ICD10-I70.0). Electronically Signed   By: Keith Rake M.D.   On: 10/21/2020 20:06     Medications   Scheduled Meds: . clopidogrel  75 mg Oral Daily  . dextromethorphan-guaiFENesin  1 tablet Oral BID  . [START ON 10/24/2020] furosemide  20 mg Oral Daily  . levothyroxine  88 mcg Oral QHS  . [START ON 10/24/2020] potassium chloride  20 mEq Oral Daily  . sodium chloride flush  3 mL Intravenous Q12H  . temazepam  15 mg Oral QHS   Continuous Infusions: . sodium chloride    . ampicillin-sulbactam (UNASYN) IV Stopped (10/23/20 1116)       LOS: 2 days    Time spent: 35 minutes with > 50% spent at bedside and in coordination of care.    Ezekiel Slocumb, DO [facility]  10/23/2020, 5:26 PM      If 7PM-7AM, please contact night-coverage. How to contact the [facility] Attending or Consulting provider [place] or covering provider during after hours [place], for this patient?    1. Check the care team in [facility] and look for a) attending/consulting [facility] provider listed and b) the [facility] team listed 2. Log into [email or web address] and use [place]'s  universal password to access. If you do not have the password, please contact the hospital operator. 3. Locate the [facility] provider you are looking for under [facility] and page to a number that you can be directly reached. 4. If you still have difficulty reaching the provider, please page the [facility] (Director on Call) for the Hospitalists listed on amion for assistance.

## 2020-10-24 ENCOUNTER — Inpatient Hospital Stay (HOSPITAL_COMMUNITY)
Admit: 2020-10-24 | Discharge: 2020-10-24 | Disposition: A | Discharge status: Home / Self Care | Payer: Medicare Other | Attending: Internal Medicine | Admitting: Internal Medicine

## 2020-10-24 DIAGNOSIS — I5031 Acute diastolic (congestive) heart failure: Secondary | ICD-10-CM

## 2020-10-24 DIAGNOSIS — J9601 Acute respiratory failure with hypoxia: Secondary | ICD-10-CM | POA: Diagnosis not present

## 2020-10-24 LAB — BASIC METABOLIC PANEL
Anion gap: 6 (ref 5–15)
BUN: 17 mg/dL (ref 8–23)
CO2: 23 mmol/L (ref 22–32)
Calcium: 8.5 mg/dL — ABNORMAL LOW (ref 8.9–10.3)
Chloride: 105 mmol/L (ref 98–111)
Creatinine, Ser: 0.76 mg/dL (ref 0.44–1.00)
GFR, Estimated: >60 mL/min (ref >60)
Glucose, Bld: 110 mg/dL — ABNORMAL HIGH (ref 70–99)
Potassium: 4.1 mmol/L (ref 3.5–5.1)
Sodium: 134 mmol/L — ABNORMAL LOW (ref 135–145)

## 2020-10-24 LAB — CBC
HCT: 27.8 % — ABNORMAL LOW (ref 36.0–46.0)
Hemoglobin: 9.2 g/dL — ABNORMAL LOW (ref 12.0–15.0)
MCH: 28.2 pg (ref 26.0–34.0)
MCHC: 33.1 g/dL (ref 30.0–36.0)
MCV: 85.3 fL (ref 80.0–100.0)
Platelets: 170 10*3/uL (ref 150–400)
RBC: 3.26 MIL/uL — ABNORMAL LOW (ref 3.87–5.11)
RDW: 15.1 % (ref 11.5–15.5)
WBC: 8.4 10*3/uL (ref 4.0–10.5)
nRBC: 0 % (ref 0.0–0.2)

## 2020-10-24 LAB — ECHOCARDIOGRAM COMPLETE
AR max vel: 5.91 cm2
AV Area VTI: 6.37 cm2
AV Area mean vel: 5.94 cm2
AV Mean grad: 11 mmHg
AV Peak grad: 19.3 mmHg
Ao pk vel: 2.2 m/s
Area-P 1/2: 2.19 cm2
Height: 65 in
MV VTI: 5 cm2
S' Lateral: 2.37 cm
Weight: 2603.19 oz

## 2020-10-24 LAB — T4, FREE: Free T4: 1.12 ng/dL (ref 0.61–1.12)

## 2020-10-24 LAB — TSH: TSH: 5.326 u[IU]/mL — ABNORMAL HIGH (ref 0.350–4.500)

## 2020-10-24 LAB — CORTISOL: Cortisol, Plasma: 8.4 ug/dL

## 2020-10-24 LAB — URIC ACID: Uric Acid, Serum: 4.9 mg/dL (ref 2.5–7.1)

## 2020-10-24 MED ORDER — ALBUTEROL SULFATE HFA 108 (90 BASE) MCG/ACT IN AERS: 2.0000 | INHALATION_SPRAY | Freq: Four times a day (QID) | RESPIRATORY_TRACT | 0 refills | Status: DC | PRN

## 2020-10-24 MED ORDER — DM-GUAIFENESIN ER 30-600 MG PO TB12
1.0000 | ORAL_TABLET | Freq: Two times a day (BID) | ORAL | 0 refills | Status: AC
Start: 2020-10-24 — End: 2020-10-31

## 2020-10-24 MED ORDER — AMOXICILLIN-POT CLAVULANATE 875-125 MG PO TABS: 1.0000 | ORAL_TABLET | Freq: Two times a day (BID) | ORAL | 0 refills | Status: AC

## 2020-10-24 MED ORDER — POTASSIUM CHLORIDE CRYS ER 20 MEQ PO TBCR: 20.0000 meq | EXTENDED_RELEASE_TABLET | Freq: Every day | ORAL | 1 refills | Status: DC

## 2020-10-24 MED ORDER — GUAIFENESIN-DM 100-10 MG/5ML PO SYRP: 5.0000 mL | ORAL_SOLUTION | ORAL | 0 refills | Status: DC | PRN

## 2020-10-24 MED ORDER — FUROSEMIDE 20 MG PO TABS: 20.0000 mg | ORAL_TABLET | Freq: Every day | ORAL | 1 refills | Status: DC

## 2020-10-24 NOTE — Care Management Important Message (Signed)
Important Message  Patient Details  Name: Kathryn Wise MRN: [id number] Date of Birth: 04-01-1930   Medicare Important Message Given:  Yes     Dannette Barbara 10/24/2020, 11:42 AM

## 2020-10-24 NOTE — TOC Initial Note (Signed)
Transition of Care [facility]) - Initial/Assessment Note    Patient Details  Name: Kathryn Wise MRN: [id number] Date of Birth: 1930-01-15  Transition of Care [facility]) CM/SW Contact:    Beverly Sessions, RN Phone Number: 10/24/2020, 10:39 AM  Clinical Narrative:                 Patient admitted from home with anemia Patient lives at home alone Daughter is in town and will be staying with patient after discharge Daughter to transport at discharge today  PCP [place] Denies issues obtaining medications  Baseline patient is independent and drives herself.  Going to water aerobics 3 times a week  Patient, daughter, and MD in agreement that no home health services indicated at discharge  Expected Discharge Plan: Home/Self Care Barriers to Discharge: No Barriers Identified   Patient Goals and CMS Choice        Expected Discharge Plan and Services Expected Discharge Plan: Home/Self Care   Discharge Planning Services: CM Consult   Living arrangements for the past 2 months: Single Family Home Expected Discharge Date: 10/24/20                                    Prior Living Arrangements/Services Living arrangements for the past 2 months: Single Family Home Lives with:: Self Patient language and need for interpreter reviewed:: Yes Do you feel safe going back to the place where you live?: Yes      Need for Family Participation in Patient Care: Yes (Comment) Care giver support system in place?: Yes (comment) Current home services: DME Criminal Activity/Legal Involvement Pertinent to Current Situation/Hospitalization: No - Comment as needed  Activities of Daily Living Home Assistive Devices/Equipment: None ADL Screening (condition at time of admission) Patient's cognitive ability adequate to safely complete daily activities?: Yes Is the patient deaf or have difficulty hearing?: No Does the patient have difficulty seeing, even when wearing glasses/contacts?:  No Does the patient have difficulty concentrating, remembering, or making decisions?: No Patient able to express need for assistance with ADLs?: Yes Does the patient have difficulty dressing or bathing?: No Independently performs ADLs?: Yes (appropriate for developmental age) Does the patient have difficulty walking or climbing stairs?: No Weakness of Legs: None Weakness of Arms/Hands: None  Permission Sought/Granted                  Emotional Assessment Appearance:: Appears stated age Attitude/Demeanor/Rapport: Engaged Affect (typically observed): Accepting Orientation: : Oriented to Self,Oriented to Place,Oriented to  Time,Oriented to Situation Alcohol / Substance Use: Not Applicable Psych Involvement: No (comment)  Admission diagnosis:  Acute respiratory failure with hypoxemia (HCC) [J96.01] Acute congestive heart failure, unspecified heart failure type [facility]) [I50.9] Patient Active Problem List   Diagnosis Date Noted  . Acute respiratory failure with hypoxemia ([place]) 10/21/2020  . Hyponatremia 10/21/2020  . Troponin I above reference range 10/21/2020  . Pneumonia 10/21/2020  . Carotid stenosis, symptomatic w/o infarct, bilateral 10/19/2020  . Tremulousness 09/22/2020  . Carotid stenosis 09/12/2020  . Urinary incontinence, urge 05/05/2020  . Arteriosclerotic cerebrovascular disease 05/05/2020  . Major depressive disorder with single episode, in full remission ([place]) 05/04/2020  . Recurrent occipital headache 03/07/2019  . Hearing loss due to cerumen impaction, left 01/23/2019  . Renal artery stenosis ([place]) 01/01/2019  . Lymphedema 01/01/2019  . PAD (peripheral artery disease) ([place]) 01/01/2019  . History of CVA (cerebrovascular accident) without residual deficits  11/13/2018  . Anemia, unspecified 11/13/2018  . Tinnitus aurium, bilateral 10/19/2018  . Blurred vision, bilateral 10/19/2018  . Insomnia due to psychological stress 10/07/2018  . Food intolerance in adult  10/07/2018  . Skin lesion of Wise 10/07/2018  . Myalgia due to statin 12/10/2017  . Hyperlipidemia LDL goal <130 10/03/2017  . Hiatal hernia 07/03/2017  . Coronary artery disease 07/03/2017  . Hospital discharge follow-up 07/03/2017  . Chest pain 06/10/2017  . Pain in left wrist 09/20/2016  . Breast cancer screening by mammogram 09/20/2016  . Hypothyroidism 04/28/2015  . Renovascular hypertension 01/13/2014  . Osteopenia 10/25/2013  . Disorder of bone and cartilage 10/25/2013  . GERD (gastroesophageal reflux disease) 09/11/2013  . Encounter for preventive health examination 09/11/2013  . Heart murmur 11/28/2012  . History of shingles   . Polymyalgia rheumatica ([place]) 11/18/2012  . Edema 11/08/2012  . Osteoarthritis of left shoulder 07/14/2012   PCP:  [name], MD Pharmacy:   [place], [place] [address] Phone: [phone] Fax: [phone]     Social Determinants of Health (SDOH) Interventions    Readmission Risk Interventions No flowsheet data found.

## 2020-10-24 NOTE — Progress Notes (Signed)
*  PRELIMINARY RESULTS* Echocardiogram 2D Echocardiogram has been performed.  Kathryn Wise 10/24/2020, 8:46 AM

## 2020-10-24 NOTE — Progress Notes (Signed)
[name] to be D/C'd home per MD order.  Discussed prescriptions and follow up appointments with the patient. Prescriptions given to patient, medication list explained in detail. Pt verbalized understanding.  Allergies as of 10/24/2020       Reactions   Alendronate Swelling   And nausea    Lisinopril    Spike in BP   Metoprolol Other (See Comments)   Lethargy - PT CURRENTLY TAKING    Oxycodone    Other reaction(s): Other (See Comments) Altered mental status   Oxycontin [oxycodone Hcl]    Altered mental status        Medication List     STOP taking these medications    amLODipine 5 MG tablet Commonly known as: NORVASC   Potassium 99 MG Tabs   telmisartan 20 MG tablet Commonly known as: MICARDIS       TAKE these medications    albuterol 108 (90 Base) MCG/ACT inhaler Commonly known as: VENTOLIN HFA Inhale 2 puffs into the lungs every 6 (six) hours as needed for wheezing or shortness of breath.   amoxicillin-clavulanate 875-125 MG tablet Commonly known as: Augmentin Take 1 tablet by mouth every 12 (twelve) hours for 4 days.   aspirin EC 81 MG tablet Take 1 tablet (81 mg total) by mouth daily. Swallow whole.   b complex vitamins capsule Take 1 capsule by mouth daily.   clopidogrel 75 MG tablet Commonly known as: PLAVIX Take 1 tablet (75 mg total) by mouth daily.   dextromethorphan-guaiFENesin 30-600 MG 12hr tablet Commonly known as: MUCINEX DM Take 1 tablet by mouth 2 (two) times daily for 7 days.   guaiFENesin-dextromethorphan 100-10 MG/5ML syrup Commonly known as: ROBITUSSIN DM Take 5 mLs by mouth every 4 (four) hours as needed for cough.   furosemide 20 MG tablet Commonly known as: LASIX Take 1 tablet (20 mg total) by mouth daily.   ketoconazole 2 % cream Commonly known as: NIZORAL Apply to the feet and between the toes every night. What changed:  how much to take how to take this when to take this   levothyroxine 88 MCG tablet Commonly  known as: SYNTHROID TAKE 1 TABLET BY MOUTH ONCE DAILY ON AN EMPTY STOMACH. WAIT 30 MINUTES BEFORE TAKING OTHER MEDS. What changed: See the new instructions.   nitroGLYCERIN 0.4 MG SL tablet Commonly known as: NITROSTAT Place 1 tablet (0.4 mg total) every 5 (five) minutes as needed under the tongue for chest pain.   omeprazole 20 MG capsule Commonly known as: PRILOSEC TAKE 1 CAPSULE BY MOUTH ONCE EVERY MORNING What changed: See the new instructions.   potassium chloride SA 20 MEQ tablet Commonly known as: KLOR-CON Take 1 tablet (20 mEq total) by mouth daily.   Restasis 0.05 % ophthalmic emulsion Generic drug: cycloSPORINE Place 1 drop into both eyes 2 (two) times daily.   rosuvastatin 10 MG tablet Commonly known as: CRESTOR Take 10 mg by mouth daily.   Systane Preservative Free 0.4-0.3 % Soln Generic drug: Polyethyl Glyc-Propyl Glyc PF Place 1 drop into both eyes 4 (four) times daily as needed (dry eyes).   temazepam 15 MG capsule Commonly known as: RESTORIL TAKE 1 CAPSULE BY MOUTH AT BEDTIME AS NEEDED SLEEP What changed: See the new instructions.   Turmeric 500 MG Caps Take 500 mg by mouth daily.   VITAMIN B 12 PO Take 1,000 mcg by mouth daily.   Vitamin D-3 25 MCG (1000 UT) Caps Take 1,000 Units by mouth daily.  Vitals:   10/24/20 0448 10/24/20 0740  BP: (!) 144/55 (!) 115/54  Pulse: 68 67  Resp: 16 (!) 22  Temp: 98.4 F (36.9 C) 98.4 F (36.9 C)  SpO2: 95% 93%    Skin clean, dry and intact without evidence of skin break down, no evidence of skin tears noted. IV catheter discontinued intact. Site without signs and symptoms of complications. Dressing and pressure applied. Pt denies pain at this time. No complaints noted.  An After Visit Summary was printed and given to the patient. Patient escorted via [place], and D/C home via private auto.  [place]

## 2020-10-24 NOTE — Discharge Summary (Signed)
Physician Discharge Summary  Kathryn Wise PRF:[id number] DOB: 10/01/1929 DOA: 10/21/2020  PCP: [name], MD  Admit date: 10/21/2020 Discharge date: 11/09/2020  Admitted From: home Disposition:  home  Recommendations for Outpatient Follow-up:  1. Follow up with PCP in 1-2 weeks 2. Please obtain BMP/CBC in one week 3. Please follow up with cardiology in 1-2 weeks to review results of echocardiogram and follow up for CHF admission  Home Health: No  Equipment/Devices: None   Discharge Condition: Stable  CODE STATUS: Full  Diet recommendation: Heart Healthy with 1500 mL/day fluid restriction    Discharge Diagnoses: Active Problems:   GERD (gastroesophageal reflux disease)   Hypothyroidism   Coronary artery disease   Arteriosclerotic cerebrovascular disease   Hyponatremia   Troponin I above reference range   Pneumonia    Summary of HPI and Hospital Course:  Kathryn Wise is a 87 y.o. female with medical history significant of GERD, hypertension, hypothyroidism, coronary artery disease, depression, dysphagia and osteoarthritis who was referred to the ED on 10/21/20 from urgent care where she was seen for complaints of cough shortness of breath as well as fever. She'd had carotid stent placement the day before.  Chest xray at urgent care was concerning for PNA and she had a temp of 100.2 F there.  Imaging here confirmed PNA and also showed some pulmonary edema.  Patient admitted at started on antibiotics and diuretics.    She had troponin elevation in the ED, was started on heparin drip.  Cardiology consulted and attributes to demand ischemia, so heparin infusion stopped.  Pt had no chest pain or any signs of ACS.  Patient's respiratory status improved significantly with diuresis and antibiotics.      Acute respiratory failure with hypoxia -present on admission due to pneumonia and CHF.  Patient presented with shortness of breath and O2 sat 89% on room air. 3/26: Weaned to  room air  Community-acquired pneumonia versus aspiration -patient developed fever and shortness of breath in the 24 hours following her procedure. --Covered with empiric Rocephin/Zithromax >> Unasyn for anaerobic coverage.  Discharged with 4 more days Augmentin, Mucinex, inhaler  Hyponatremia -presented with sodium 128.  Suspect multifactorial.  Initially suspect hypervolemic in the setting of CHF and volume overload.   Na 128>130>126>>134 at time of d/c. She may also SIADH given her respiratory issues.   --sOsm 281, urOsm 334 (on diuretics) --Fluid restriction   Acute on chronic diastolic CHF -patient reported 14 pound weight gain, increased lower extremity swelling and worsening shortness of breath.  This improved with IV diuresis.   She also reported not recently taking her diuretic. --Cardiology following, appreciate recommendations --Started on oral Lasix 20 mg daily --Cardiology follow up to discuss echo results --Continue Daily weights at home --Fluid restriction and low sodium diet  Elevated troponin -likely due to demand ischemia in the setting of above.  Was started on heparin drip empirically per ACS protocol.  Troponin has trended down and patient remained without any chest pain.  Appreciate cardiology's input.   Carotid artery stenosis status post recent left stent -on 3/24 with vascular surgery.  Continue Plavix and statin  Hypertension -takes amlodipine 5 mg and telmisartan at home which were held and patient remained normotensive while on IV Lasix.  Consider addition of low-dose beta-blocker given her CAD and CHF if BP tolerates. At discharge: off amlodipine due to soft/normal BP's Started on 20 mg lasix daily  CAD -stable with no active chest pain.  on ASA  and Plavix. Echo is pending, resulted to be reviewed with cardiology in follow up.  PAD -on ASA and Plavix Hyperlipidemia -on statin.  LDL goal under 70 Hypothyroidism -continue levothyroxine GERD  -continue PPI Thrombocytopenia -appears chronic.  Stable.    Discharge Instructions   Discharge Instructions    (HEART FAILURE PATIENTS) Call MD:  Anytime you have any of the following symptoms: 1) 3 pound weight gain in 24 hours or 5 pounds in 1 week 2) shortness of breath, with or without a dry hacking cough 3) swelling in the hands, feet or stomach 4) if you have to sleep on extra pillows at night in order to breathe.   Complete by: As directed    Call MD for:   Complete by: As directed    Worsening shortness of breath, waking up at night short of breath.   Call MD for:  extreme fatigue   Complete by: As directed    Call MD for:  persistant dizziness or light-headedness   Complete by: As directed    Call MD for:  persistant nausea and vomiting   Complete by: As directed    Call MD for:  severe uncontrolled pain   Complete by: As directed    Call MD for:  temperature >100.4   Complete by: As directed    Diet - low sodium heart healthy   Complete by: As directed    Discharge instructions   Complete by: As directed    Continue to take antibiotic (Augmentin) twice daily for 4 more days to clear up your pneumonia. Follow up with primary care doctor in about 1-2 weeks to see how you're recovering.   Blood pressure - we have held your amlodipine here in the hospital and your BP has been normal (low side of normal).  Since Lasix also lowers your blood pressure, we will stop amlodipine for now to prevent your BP from dropping to low.   If you feel dizzy or lightheaded, please check your BP as it might be low.   Would recommend checking your BP 1-2 times daily and bring those readings to your cardiology and/or primary care follow up visits.   Increase activity slowly   Complete by: As directed    No wound care   Complete by: As directed      Allergies as of 10/24/2020      Reactions   Alendronate Swelling   And nausea    Lisinopril    Spike in BP   Metoprolol Other (See Comments)    Lethargy - PT CURRENTLY TAKING    Oxycodone    Other reaction(s): Other (See Comments) Altered mental status   Oxycontin [oxycodone Hcl]    Altered mental status      Medication List    STOP taking these medications   amLODipine 5 MG tablet Commonly known as: NORVASC   Potassium 99 MG Tabs   telmisartan 20 MG tablet Commonly known as: MICARDIS     TAKE these medications   albuterol 108 (90 Base) MCG/ACT inhaler Commonly known as: VENTOLIN HFA Inhale 2 puffs into the lungs every 6 (six) hours as needed for wheezing or shortness of breath.   aspirin EC 81 MG tablet Take 1 tablet (81 mg total) by mouth daily. Swallow whole.   b complex vitamins capsule Take 1 capsule by mouth daily.   clopidogrel 75 MG tablet Commonly known as: PLAVIX Take 1 tablet (75 mg total) by mouth daily.   furosemide 20 MG tablet  Commonly known as: LASIX Take 1 tablet (20 mg total) by mouth daily.   guaiFENesin-dextromethorphan 100-10 MG/5ML syrup Commonly known as: ROBITUSSIN DM Take 5 mLs by mouth every 4 (four) hours as needed for cough.   ketoconazole 2 % cream Commonly known as: NIZORAL Apply to the feet and between the toes every night. What changed:   how much to take  how to take this  when to take this   levothyroxine 88 MCG tablet Commonly known as: SYNTHROID TAKE 1 TABLET BY MOUTH ONCE DAILY ON AN EMPTY STOMACH. WAIT 30 MINUTES BEFORE TAKING OTHER MEDS. What changed: See the new instructions.   nitroGLYCERIN 0.4 MG SL tablet Commonly known as: NITROSTAT Place 1 tablet (0.4 mg total) every 5 (five) minutes as needed under the tongue for chest pain.   omeprazole 20 MG capsule Commonly known as: PRILOSEC TAKE 1 CAPSULE BY MOUTH ONCE EVERY MORNING What changed: See the new instructions.   potassium chloride SA 20 MEQ tablet Commonly known as: KLOR-CON Take 1 tablet (20 mEq total) by mouth daily.   Restasis 0.05 % ophthalmic emulsion Generic drug:  cycloSPORINE Place 1 drop into both eyes 2 (two) times daily.   rosuvastatin 10 MG tablet Commonly known as: CRESTOR Take 10 mg by mouth daily.   Systane Preservative Free 0.4-0.3 % Soln Generic drug: Polyethyl Glyc-Propyl Glyc PF Place 1 drop into both eyes 4 (four) times daily as needed (dry eyes).   temazepam 15 MG capsule Commonly known as: RESTORIL TAKE 1 CAPSULE BY MOUTH AT BEDTIME AS NEEDED SLEEP What changed: See the new instructions.   Turmeric 500 MG Caps Take 500 mg by mouth daily.   VITAMIN B 12 PO Take 1,000 mcg by mouth daily.   Vitamin D-3 25 MCG (1000 UT) Caps Take 1,000 Units by mouth daily.     ASK your doctor about these medications   amoxicillin-clavulanate 875-125 MG tablet Commonly known as: Augmentin Take 1 tablet by mouth every 12 (twelve) hours for 4 days. Ask about: Should I take this medication?   dextromethorphan-guaiFENesin 30-600 MG 12hr tablet Commonly known as: MUCINEX DM Take 1 tablet by mouth 2 (two) times daily for 7 days. Ask about: Should I take this medication?       Allergies  Allergen Reactions  . Alendronate Swelling    And nausea    . Lisinopril     Spike in BP  . Metoprolol Other (See Comments)    Lethargy - PT CURRENTLY TAKING   . Oxycodone     Other reaction(s): Other (See Comments) Altered mental status  . Oxycontin [Oxycodone Hcl]     Altered mental status     If you experience worsening of your admission symptoms, develop shortness of breath, life threatening emergency, suicidal or homicidal thoughts you must seek medical attention immediately by calling 911 or calling your MD immediately  if symptoms less severe.    Please note   You were cared for by a hospitalist during your hospital stay. If you have any questions about your discharge medications or the care you received while you were in the hospital after you are discharged, you can call the unit and asked to speak with the hospitalist on call if the  hospitalist that took care of you is not available. Once you are discharged, your primary care physician will handle any further medical issues. Please note that NO REFILLS for any discharge medications will be authorized once you are discharged, as it is imperative  that you return to your primary care physician (or establish a relationship with a primary care physician if you do not have one) for your aftercare needs so that they can reassess your need for medications and monitor your lab values.   Consultations:  Cardiology    Procedures/Studies: DG Chest 2 View  Result Date: 10/21/2020 CLINICAL DATA:  Cough for 3 weeks. Shortness of breath. Recent carotid stent placement, discharged yesterday. Concern for pneumonia EXAM: CHEST - 2 VIEW COMPARISON:  Most recent radiograph 11/08/2018. FINDINGS: Upper normal heart size. Unchanged mediastinal contours. Aortic atherosclerosis. There is a retrocardiac hiatal hernia. Small symmetric bilateral pleural effusions. There is vascular congestion and suggestion of mild pulmonary edema. Slightly more patchy opacity in the right perihilar lung. No pneumothorax. Left humeral arthroplasty in place. IMPRESSION: 1. Small bilateral pleural effusions. Borderline cardiomegaly with vascular congestion and suggestion of mild pulmonary edema. Findings suggestive of fluid overload/mild CHF. 2. Slightly more patchy opacity in the right perihilar lung, may be atelectasis or pneumonia. Electronically Signed   By: Keith Rake M.D.   On: 10/21/2020 18:17   CT Chest Wo Contrast  Result Date: 10/21/2020 CLINICAL DATA:  Pneumonia, effusion or abscess suspected, xray done Shortness of breath since this morning.  Cough for 3 weeks. EXAM: CT CHEST WITHOUT CONTRAST TECHNIQUE: Multidetector CT imaging of the chest was performed following the standard protocol without IV contrast. COMPARISON:  Radiograph earlier today.  Chest CTA 06/10/2017 FINDINGS: Cardiovascular: Upper normal  heart size. Aortic atherosclerosis. No aortic aneurysm. Mild aortic tortuosity. There are coronary artery calcifications. Mitral annulus and aortic valvular calcifications. Trace pericardial fluid. Mediastinum/Nodes: Multiple prominent mediastinal nodes largest measuring 10 mm in the subcarinal station. Limited assessment for hilar adenopathy on this noncontrast exam. Moderate-sized hiatal hernia. Lungs/Pleura: Small bilateral pleural effusions, right greater than left. Minimal fluid in the fissures. Patchy perihilar ground-glass opacity in the right lung involves the upper, middle, and lower lobe. Few nodular areas in the area of ground-glass opacity, including a partially cavitary right lower lobe nodule measuring 8 mm, series 3, image 77. Mild biapical pleuroparenchymal scarring. There is minimal basilar septal thickening which may represent mild pulmonary edema. Mild peribronchial thickening. Upper Abdomen: Moderate hiatal hernia. Stent in the right renal artery. Abdominal vascular calcifications. Diverticulosis noted of the splenic flexure. Musculoskeletal: There are no acute or suspicious osseous abnormalities. Left humeral arthroplasty. IMPRESSION: 1. Patchy perihilar ground-glass opacity in the right lung consistent with pneumonia. Few nodular areas in the area of ground-glass opacity, including a partially cavitary right lower lobe nodule measuring 8 mm, likely part of underlying infectious process. 2. Small bilateral pleural effusions, right greater than left. Minimal basilar septal thickening may represent mild pulmonary edema. 3. Prominent mediastinal nodes are likely reactive. 4. Coronary artery calcifications. Mitral annulus and aortic valvular calcifications. Aortic atherosclerosis. 5. Moderate hiatal hernia. Aortic Atherosclerosis (ICD10-I70.0). Electronically Signed   By: Keith Rake M.D.   On: 10/21/2020 20:06   PERIPHERAL VASCULAR CATHETERIZATION  Result Date: 10/19/2020 See Op  Note  ECHOCARDIOGRAM COMPLETE  Result Date: 10/24/2020    ECHOCARDIOGRAM REPORT   Patient Name:   Kathryn Wise Date of Exam: 10/24/2020 Medical Rec #:  [id number]      Height:       65.0 in Accession #:    [id number]     Weight:       162.7 lb Date of Birth:  30-Aug-1929      BSA:          1.812  m Patient Age:    54 years       BP:           115/54 mmHg Patient Gender: F              HR:           67 bpm. Exam Location:  [facility] Procedure: 2D Echo, Cardiac Doppler, Color Doppler and Strain Analysis Indications:     CHF-acute diastolic Z66.29  History:         Patient has prior history of Echocardiogram examinations, most                  recent 06/26/2017. Signs/Symptoms:Murmur; Risk                  Factors:Hypertension.  Sonographer:     Sherrie Sport RDCS (AE) Referring Phys:  [place] Diagnosing Phys: Kathlyn Sacramento MD  Sonographer Comments: Suboptimal apical window. Global longitudinal strain was attempted. IMPRESSIONS  1. Left ventricular ejection fraction, by estimation, is 55 to 60%. The left ventricle has normal function. Left ventricular endocardial border not optimally defined to evaluate regional wall motion. There is moderate left ventricular hypertrophy. Left ventricular diastolic parameters are consistent with Grade II diastolic dysfunction (pseudonormalization). Moderate LVOT obstruction with peak gradient of 68 mm Hg at rest and 74 mm Hg with Valsalva.  2. Right ventricular systolic function is normal. The right ventricular size is normal. There is moderately elevated pulmonary artery systolic pressure. The estimated right ventricular systolic pressure is 47.6 mmHg.  3. Left atrial size was moderately dilated.  4. The mitral valve is abnormal. Mild mitral valve regurgitation. Mild mitral stenosis. The mean mitral valve gradient is 6.0 mmHg. Moderate mitral annular calcification.  5. The aortic valve is normal in structure. Aortic valve regurgitation is not visualized. Mild to moderate  aortic valve sclerosis/calcification is present, without any evidence of aortic stenosis. FINDINGS  Left Ventricle: Left ventricular ejection fraction, by estimation, is 55 to 60%. The left ventricle has normal function. Left ventricular endocardial border not optimally defined to evaluate regional wall motion. Global longitudinal strain performed but  not reported based on interpreter judgement due to suboptimal tracking. The left ventricular internal cavity size was normal in size. There is moderate left ventricular hypertrophy. Left ventricular diastolic parameters are consistent with Grade II diastolic dysfunction (pseudonormalization). Right Ventricle: The right ventricular size is normal. No increase in right ventricular wall thickness. Right ventricular systolic function is normal. There is moderately elevated pulmonary artery systolic pressure. The tricuspid regurgitant velocity is 3.22 m/s, and with an assumed right atrial pressure of 5 mmHg, the estimated right ventricular systolic pressure is 54.6 mmHg. Left Atrium: Left atrial size was moderately dilated. Right Atrium: Right atrial size was normal in size. Pericardium: There is no evidence of pericardial effusion. Mitral Valve: The mitral valve is abnormal. Moderate mitral annular calcification. Mild mitral valve regurgitation. Mild mitral valve stenosis. MV peak gradient, 14.0 mmHg. The mean mitral valve gradient is 6.0 mmHg. Tricuspid Valve: The tricuspid valve is normal in structure. Tricuspid valve regurgitation is mild . No evidence of tricuspid stenosis. Aortic Valve: The aortic valve is normal in structure. Aortic valve regurgitation is not visualized. Mild to moderate aortic valve sclerosis/calcification is present, without any evidence of aortic stenosis. Aortic valve mean gradient measures 11.0 mmHg.  Aortic valve peak gradient measures 19.3 mmHg. Aortic valve area, by VTI measures 6.37 cm. Pulmonic Valve: The pulmonic valve was normal in  structure.  Pulmonic valve regurgitation is not visualized. No evidence of pulmonic stenosis. Aorta: The aortic root is normal in size and structure. Venous: The inferior vena cava was not well visualized. IAS/Shunts: No atrial level shunt detected by color flow Doppler.  LEFT VENTRICLE PLAX 2D LVIDd:         3.29 cm  Diastology LVIDs:         2.37 cm  LV e' medial:    4.03 cm/s LV PW:         1.56 cm  LV E/e' medial:  34.7 LV IVS:        1.23 cm  LV e' lateral:   5.22 cm/s LVOT diam:     2.00 cm  LV E/e' lateral: 26.8 LV SV:         284 LV SV Index:   157 LVOT Area:     3.14 cm                          3D Volume EF:                         3D EF:        59 %                         LV EDV:       193 ml                         LV ESV:       78 ml                         LV SV:        115 ml RIGHT VENTRICLE RV Basal diam:  3.94 cm RV S prime:     13.60 cm/s TAPSE (M-mode): 3.6 cm LEFT ATRIUM             Index       RIGHT ATRIUM           Index LA diam:        4.60 cm 2.54 cm/m  RA Area:     16.10 cm LA Vol (A2C):   68.8 ml 37.97 ml/m RA Volume:   42.70 ml  23.57 ml/m LA Vol (A4C):   97.8 ml 53.97 ml/m LA Biplane Vol: 83.3 ml 45.97 ml/m  AORTIC VALVE                    PULMONIC VALVE AV Area (Vmax):    5.91 cm     PV Vmax:        0.65 m/s AV Area (Vmean):   5.94 cm     PV Peak grad:   1.7 mmHg AV Area (VTI):     6.37 cm     RVOT Peak grad: 4 mmHg AV Vmax:           219.67 cm/s AV Vmean:          151.667 cm/s AV VTI:            0.446 m AV Peak Grad:      19.3 mmHg AV Mean Grad:      11.0 mmHg LVOT Vmax:         413.00 cm/s LVOT Vmean:        287.000 cm/s LVOT VTI:  0.903 m LVOT/AV VTI ratio: 2.03  AORTA Ao Root diam: 3.07 cm MITRAL VALVE                TRICUSPID VALVE MV Area (PHT): 2.19 cm     TR Peak grad:   41.5 mmHg MV Area VTI:   5.00 cm     TR Vmax:        322.00 cm/s MV Peak grad:  14.0 mmHg MV Mean grad:  6.0 mmHg     SHUNTS MV Vmax:       1.87 m/s     Systemic VTI:  0.90 m MV Vmean:       109.0 cm/s   Systemic Diam: 2.00 cm MV Decel Time: 346 msec MV E velocity: 140.00 cm/s MV A velocity: 154.00 cm/s MV E/A ratio:  0.91 Kathlyn Sacramento MD Electronically signed by Kathlyn Sacramento MD Signature Date/Time: 10/24/2020/1:43:35 PM    Final        Subjective: Pt reports feeling well this AM.  No SOB or CP.  NO acute events reported.  Okay with cardiology follow up for echo results.   Discharge Exam: Vitals:   10/24/20 0448 10/24/20 0740  BP: (!) 144/55 (!) 115/54  Pulse: 68 67  Resp: 16 (!) 22  Temp: 98.4 F (36.9 C) 98.4 F (36.9 C)  SpO2: 95% 93%   Vitals:   10/23/20 2347 10/24/20 0448 10/24/20 0600 10/24/20 0740  BP: (!) 119/43 (!) 144/55  (!) 115/54  Pulse: 63 68  67  Resp: 20 16  (!) 22  Temp: 98.8 F (37.1 C) 98.4 F (36.9 C)  98.4 F (36.9 C)  TempSrc: Oral   Oral  SpO2: 95% 95%  93%  Weight:   73.8 kg   Height:        General: Pt is alert, awake, not in acute distress Cardiovascular: RRR, S1/S2 +, no rubs, no gallops Respiratory: CTA bilaterally, no wheezing, no rhonchi Abdominal: Soft, NT, ND, bowel sounds + Extremities: no edema, no cyanosis    The results of significant diagnostics from this hospitalization (including imaging, microbiology, ancillary and laboratory) are listed below for reference.     Microbiology: No results found for this or any previous visit (from the past 240 hour(s)).   Labs: BNP (last 3 results) Recent Labs    10/21/20 1737  BNP 962.9*   Basic Metabolic Panel: Recent Labs  Lab 11/03/20 1054  NA 134*  K 4.6  CL 98  CO2 30  GLUCOSE 90  BUN 12  CREATININE 0.93  CALCIUM 9.4   Liver Function Tests: No results for input(s): AST, ALT, ALKPHOS, BILITOT, PROT, ALBUMIN in the last 168 hours. No results for input(s): LIPASE, AMYLASE in the last 168 hours. No results for input(s): AMMONIA in the last 168 hours. CBC: No results for input(s): WBC, NEUTROABS, HGB, HCT, MCV, PLT in the last 168 hours. Cardiac  Enzymes: No results for input(s): CKTOTAL, CKMB, CKMBINDEX, TROPONINI in the last 168 hours. BNP: Invalid input(s): POCBNP CBG: No results for input(s): GLUCAP in the last 168 hours. D-Dimer No results for input(s): DDIMER in the last 72 hours. Hgb A1c No results for input(s): HGBA1C in the last 72 hours. Lipid Profile No results for input(s): CHOL, HDL, LDLCALC, TRIG, CHOLHDL, LDLDIRECT in the last 72 hours. Thyroid function studies No results for input(s): TSH, T4TOTAL, T3FREE, THYROIDAB in the last 72 hours.  Invalid input(s): FREET3 Anemia work up No results for input(s): VITAMINB12, FOLATE, FERRITIN, TIBC, IRON, RETICCTPCT  in the last 72 hours. Urinalysis    Component Value Date/Time   COLORURINE YELLOW 05/04/2020 1458   APPEARANCEUR CLEAR 05/04/2020 1458   LABSPEC 1.010 05/04/2020 1458   PHURINE 6.0 05/04/2020 1458   GLUCOSEU NEGATIVE 05/04/2020 1458   HGBUR TRACE-INTACT (A) 05/04/2020 1458   BILIRUBINUR NEGATIVE 05/04/2020 1458   KETONESUR NEGATIVE 05/04/2020 1458   PROTEINUR 30 (A) 02/24/2019 1134   UROBILINOGEN 0.2 05/04/2020 1458   NITRITE NEGATIVE 05/04/2020 1458   LEUKOCYTESUR SMALL (A) 05/04/2020 1458   Sepsis Labs Invalid input(s): PROCALCITONIN,  WBC,  LACTICIDVEN Microbiology No results found for this or any previous visit (from the past 240 hour(s)).   Time coordinating discharge: Over 30 minutes  SIGNED:   Ezekiel Slocumb, DO [facility] 11/09/2020, 1:38 PM   If 7PM-7AM, please contact night-coverage [email or web address]

## 2020-10-25 ENCOUNTER — Telehealth: Payer: Self-pay

## 2020-10-25 LAB — UREA NITROGEN, URINE: Urea Nitrogen, Ur: 337 mg/dL

## 2020-10-25 NOTE — Telephone Encounter (Signed)
I have sent the patient a message stating we have scheduled her HFU for Friday and that we could  Order or discuss ordering home health at the visit on Friday, due to DC note states MD did not recommend home health at discharge, or that they would need to call the discharge planning nurse if they were advised differently at discharge.

## 2020-10-25 NOTE — Telephone Encounter (Signed)
Transition Care Management Unsuccessful Follow-up Telephone Call  Date of discharge and from where:  10/24/20 from [facility]  Attempts:  1st Attempt  Reason for unsuccessful TCM follow-up call:  Unable to reach patient  Will follow. Hospital follow up scheduled 10/28/20 @ 11:00.

## 2020-10-26 LAB — CULTURE, BLOOD (ROUTINE X 2)
Culture: NO GROWTH
Culture: NO GROWTH
Special Requests: ADEQUATE
Special Requests: ADEQUATE

## 2020-10-26 NOTE — Telephone Encounter (Signed)
Transition Care Management Follow-up Telephone Call  Date of discharge and from where: 10/24/20 from [facility]  How have you been since you were released from the hospital? Patient states," I am just zapped after working around the house a bit. I do still have a cough with some phlegm from time to time and the shortness of breath is getting better." Spirometer in use every hour. Denies dizziness, n/v/d, pain, blurred vision, fever, wheezing. Input/output appropriate.   Any questions or concerns? No  Items Reviewed:  Did the pt receive and understand the discharge instructions provided? Yes , increase activity slowly, rest.  Medications obtained and verified? Yes , taking all scheduled medications as directed.   Other? No   Any new allergies since your discharge? No   Dietary orders reviewed? Yes  Do you have support at home? No , daughter is with her.   Home Care and Equipment/Supplies: Were home health services ordered? No  Functional Questionnaire: (I = Independent and D = Dependent) ADLs: I  Follow up appointments reviewed:   PCP Hospital f/u appt confirmed? Yes  Scheduled to see Dr. Derrel Nip on 10/28/20 @ 11:00.  [facility] f/u ? Awaiting call back from Vascular Surgeon.  Are transportation arrangements needed? No   If their condition worsens, is the pt aware to call PCP or go to the Emergency Dept.? Yes  Was the patient provided with contact information for the PCP's office or ED? Yes  Was to pt encouraged to call back with questions or concerns? Yes

## 2020-10-28 ENCOUNTER — Other Ambulatory Visit: Payer: Self-pay

## 2020-10-28 ENCOUNTER — Encounter: Payer: Self-pay | Admitting: Internal Medicine

## 2020-10-28 ENCOUNTER — Telehealth: Payer: Self-pay | Admitting: Internal Medicine

## 2020-10-28 ENCOUNTER — Ambulatory Visit (INDEPENDENT_AMBULATORY_CARE_PROVIDER_SITE_OTHER): Payer: Medicare Other | Admitting: Internal Medicine

## 2020-10-28 VITALS — BP 130/62 | HR 79 | Temp 98.1°F | Ht 65.0 in | Wt 155.8 lb

## 2020-10-28 DIAGNOSIS — J984 Other disorders of lung: Secondary | ICD-10-CM | POA: Diagnosis not present

## 2020-10-28 DIAGNOSIS — J189 Pneumonia, unspecified organism: Secondary | ICD-10-CM

## 2020-10-28 DIAGNOSIS — R29898 Other symptoms and signs involving the musculoskeletal system: Secondary | ICD-10-CM

## 2020-10-28 DIAGNOSIS — I6523 Occlusion and stenosis of bilateral carotid arteries: Secondary | ICD-10-CM

## 2020-10-28 DIAGNOSIS — Z09 Encounter for follow-up examination after completed treatment for conditions other than malignant neoplasm: Secondary | ICD-10-CM

## 2020-10-28 DIAGNOSIS — H538 Other visual disturbances: Secondary | ICD-10-CM

## 2020-10-28 DIAGNOSIS — I150 Renovascular hypertension: Secondary | ICD-10-CM

## 2020-10-28 DIAGNOSIS — R2689 Other abnormalities of gait and mobility: Secondary | ICD-10-CM

## 2020-10-28 MED ORDER — LEVALBUTEROL HCL 0.63 MG/3ML IN NEBU: 0.6300 mg | INHALATION_SOLUTION | Freq: Four times a day (QID) | RESPIRATORY_TRACT | 0 refills | Status: DC | PRN

## 2020-10-28 NOTE — Progress Notes (Signed)
Subjective:  Patient ID: Kathryn Wise, female    DOB: 12-Aug-1929  Age: 87 y.o. MRN: [id number]  CC: The primary encounter diagnosis was Poor balance. Diagnoses of Proximal leg weakness, Cavitary lesion of lung, Blurred vision, bilateral, Renovascular hypertension, Hospital discharge follow-up, Pneumonia of both lungs due to infectious organism, unspecified part of lung, and Carotid stenosis, symptomatic w/o infarct, bilateral were also pertinent to this visit.  HPI Kathryn Wise presents for hospital follow up  This visit occurred during the SARS-CoV-2 public health emergency.  Safety protocols were in place, including screening questions prior to the visit, additional usage of staff PPE, and extensive cleaning of exam room while observing appropriate contact time as indicated for disinfecting solutions.   Kathryn Wise was admitted to [facility] on March 25 with bilateral pneumonia (RUL, LLL) and hypoxemic respiratory failure.  She was COVID negative and fully vaccinated at time of illness.  She had been coughing for 3 weeks ,  But did not become short of breath until she was discharged home after overnight observation in the ICU at [facility] following an elective vascular procedure.  (She was admitted to [facility] for stenting of the left carotid on March 23 after a CTA done to investigate  A report of amaurosis fugax by Dr Lelon Frohlich noted a 90% stenosis of the left ICA.  She tolerated the procedure and notes today that her visual acuity has improved since the stent was placed. )  ON March 25 she developed  hypoxia and dyspnea and was admitted for treatment of pneumonia and diastolic heart failure with demand ischemia .  She was given broad spectrum antibiotics and low dose furosemide.  Blood cultures were negative .  She was discharged home on march 28 with 4 more days of Augmentin and 20 mg furosemide   Balance and legs weak after 5 days of laying in the bed.  Does not want PT  But needs it  Doing incentive  spirometry at home.  Personal best is 1750 ml .  Having trouble using albuterol MDI due to arthritis.    Her PULSATILE TINNITUS has been much worse  Since discharge and is  Keeping her awake along with right sided occipital soreness . Using temazepam for the past year  C/p ".  Thoughts are fuzzy "  But limited to remembering names .   Blood pressure has been < 140/80 at home without amlodipine or telmisartan..  Using furosemide only.   Outpatient Medications Prior to Visit  Medication Sig Dispense Refill  . albuterol (VENTOLIN HFA) 108 (90 Base) MCG/ACT inhaler Inhale 2 puffs into the lungs every 6 (six) hours as needed for wheezing or shortness of breath. 8 g 0  . amoxicillin-clavulanate (AUGMENTIN) 875-125 MG tablet Take 1 tablet by mouth every 12 (twelve) hours for 4 days. 8 tablet 0  . aspirin EC 81 MG tablet Take 1 tablet (81 mg total) by mouth daily. Swallow whole. 90 tablet 3  . b complex vitamins capsule Take 1 capsule by mouth daily.    . Cholecalciferol (VITAMIN D-3) 1000 units CAPS Take 1,000 Units by mouth daily.    . clopidogrel (PLAVIX) 75 MG tablet Take 1 tablet (75 mg total) by mouth daily. 90 tablet 3  . Cyanocobalamin (VITAMIN B 12 PO) Take 1,000 mcg by mouth daily.    [name] dextromethorphan-guaiFENesin (MUCINEX DM) 30-600 MG 12hr tablet Take 1 tablet by mouth 2 (two) times daily for 7 days. 14 tablet 0  . furosemide (LASIX) 20 MG  tablet Take 1 tablet (20 mg total) by mouth daily. 90 tablet 1  . guaiFENesin-dextromethorphan (ROBITUSSIN DM) 100-10 MG/5ML syrup Take 5 mLs by mouth every 4 (four) hours as needed for cough. 118 mL 0  . ketoconazole (NIZORAL) 2 % cream Apply to the feet and between the toes every night. (Patient taking differently: Apply 1 application topically at bedtime. Apply to the feet and between the toes every night.) 60 g 3  . levothyroxine (SYNTHROID) 88 MCG tablet TAKE 1 TABLET BY MOUTH ONCE DAILY ON AN EMPTY STOMACH. WAIT 30 MINUTES BEFORE TAKING OTHER MEDS.  (Patient taking differently: Take 88 mcg by mouth daily before breakfast. TAKE 1 TABLET BY MOUTH ONCE DAILY ON AN EMPTY STOMACH. WAIT 30 MINUTES BEFORE TAKING OTHER MEDS.) 90 tablet 1  . nitroGLYCERIN (NITROSTAT) 0.4 MG SL tablet Place 1 tablet (0.4 mg total) every 5 (five) minutes as needed under the tongue for chest pain. 30 tablet 2  . omeprazole (PRILOSEC) 20 MG capsule TAKE 1 CAPSULE BY MOUTH ONCE EVERY MORNING (Patient taking differently: Take 20 mg by mouth daily as needed (heartburn). TAKE 1 CAPSULE BY MOUTH ONCE EVERY MORNING AS NEEDED) 90 capsule 2  . potassium chloride SA (KLOR-CON) 20 MEQ tablet Take 1 tablet (20 mEq total) by mouth daily. 30 tablet 1  . RESTASIS 0.05 % ophthalmic emulsion Place 1 drop into both eyes 2 (two) times daily.     . rosuvastatin (CRESTOR) 10 MG tablet Take 10 mg by mouth daily.    [name] PRESERVATIVE FREE 0.4-0.3 % SOLN Place 1 drop into both eyes 4 (four) times daily as needed (dry eyes).    . temazepam (RESTORIL) 15 MG capsule TAKE 1 CAPSULE BY MOUTH AT BEDTIME AS NEEDED SLEEP (Patient taking differently: Take by mouth at bedtime as needed for sleep.) 30 capsule 5  . Turmeric 500 MG CAPS Take 500 mg by mouth daily.     No facility-administered medications prior to visit.    Review of Systems;  Patient denies headache, fevers, malaise, unintentional weight loss, skin rash, eye pain, sinus congestion and sinus pain, sore throat, dysphagia,  hemoptysis , cough, dyspnea, wheezing, chest pain, palpitations, orthopnea, edema, abdominal pain, nausea, melena, diarrhea, constipation, flank pain, dysuria, hematuria, urinary  Frequency, nocturia, numbness, tingling, seizures,  Focal weakness, Loss of consciousness,  Tremor, insomnia, depression, anxiety, and suicidal ideation.      Objective:  BP 130/62 (BP Location: Left Arm, Patient Position: Sitting, Cuff Size: Normal)   Pulse 79   Temp 98.1 F (36.7 C)   Ht 5\' 5"  (1.651 m)   Wt 155 lb 12.8 oz (70.7 kg)    SpO2 99%   BMI 25.93 kg/m   BP Readings from Last 3 Encounters:  10/28/20 130/62  10/24/20 (!) 115/54  10/20/20 (!) 116/48    Wt Readings from Last 3 Encounters:  10/28/20 155 lb 12.8 oz (70.7 kg)  10/24/20 162 lb 11.2 oz (73.8 kg)  10/19/20 152 lb (68.9 kg)    General appearance: alert, cooperative and appears stated age Ears: normal TM's and external ear canals both ears Throat: lips, mucosa, and tongue normal; teeth and gums normal Neck: no adenopathy, no carotid bruit, supple, symmetrical, trachea midline and thyroid not enlarged, symmetric, no tenderness/mass/nodules Back: symmetric, no curvature. ROM normal. No CVA tenderness. Lungs: clear to auscultation bilaterally Heart: regular rate and rhythm, S1, S2 normal, no murmur, click, rub or gallop Abdomen: soft, non-tender; bowel sounds normal; no masses,  no organomegaly Pulses: 2+  and symmetric Skin: Skin color, texture, turgor normal. No rashes or lesions Lymph nodes: Cervical, supraclavicular, and axillary nodes normal.  Lab Results  Component Value Date   HGBA1C 5.7 (A) 09/21/2020   HGBA1C 5.9 11/03/2019   HGBA1C 6.3 10/01/2017    Lab Results  Component Value Date   CREATININE 0.76 10/24/2020   CREATININE 0.90 10/23/2020   CREATININE 0.82 10/22/2020   CREATININE 0.81 10/22/2020    Lab Results  Component Value Date   WBC 8.4 10/24/2020   HGB 9.2 (L) 10/24/2020   HCT 27.8 (L) 10/24/2020   PLT 170 10/24/2020   GLUCOSE 110 (H) 10/24/2020   CHOL 122 09/21/2020   TRIG 110.0 09/21/2020   HDL 49.60 09/21/2020   LDLDIRECT 126.0 09/19/2016   LDLCALC 51 09/21/2020   ALT 12 10/21/2020   AST 29 10/21/2020   NA 134 (L) 10/24/2020   K 4.1 10/24/2020   CL 105 10/24/2020   CREATININE 0.76 10/24/2020   BUN 17 10/24/2020   CO2 23 10/24/2020   TSH 5.326 (H) 10/24/2020   INR 1.1 10/21/2020   HGBA1C 5.7 (A) 09/21/2020    ECHOCARDIOGRAM COMPLETE  Result Date: 10/24/2020    ECHOCARDIOGRAM REPORT   Patient Name:    Kathryn Wise Date of Exam: 10/24/2020 Medical Rec #:  [id number]      Height:       65.0 in Accession #:    [id number]     Weight:       162.7 lb Date of Birth:  1929/10/08      BSA:          1.812 m Patient Age:    89 years       BP:           115/54 mmHg Patient Gender: F              HR:           67 bpm. Exam Location:  [facility] Procedure: 2D Echo, Cardiac Doppler, Color Doppler and Strain Analysis Indications:     CHF-acute diastolic X32.35  History:         Patient has prior history of Echocardiogram examinations, most                  recent 06/26/2017. Signs/Symptoms:Murmur; Risk                  Factors:Hypertension.  Sonographer:     Sherrie Sport RDCS (AE) Referring Phys:  [place] Diagnosing Phys: Kathlyn Sacramento MD  Sonographer Comments: Suboptimal apical window. Global longitudinal strain was attempted. IMPRESSIONS  1. Left ventricular ejection fraction, by estimation, is 55 to 60%. The left ventricle has normal function. Left ventricular endocardial border not optimally defined to evaluate regional wall motion. There is moderate left ventricular hypertrophy. Left ventricular diastolic parameters are consistent with Grade II diastolic dysfunction (pseudonormalization). Moderate LVOT obstruction with peak gradient of 68 mm Hg at rest and 74 mm Hg with Valsalva.  2. Right ventricular systolic function is normal. The right ventricular size is normal. There is moderately elevated pulmonary artery systolic pressure. The estimated right ventricular systolic pressure is 57.3 mmHg.  3. Left atrial size was moderately dilated.  4. The mitral valve is abnormal. Mild mitral valve regurgitation. Mild mitral stenosis. The mean mitral valve gradient is 6.0 mmHg. Moderate mitral annular calcification.  5. The aortic valve is normal in structure. Aortic valve regurgitation is not visualized. Mild to moderate aortic valve sclerosis/calcification  is present, without any evidence of aortic stenosis. FINDINGS  Left  Ventricle: Left ventricular ejection fraction, by estimation, is 55 to 60%. The left ventricle has normal function. Left ventricular endocardial border not optimally defined to evaluate regional wall motion. Global longitudinal strain performed but  not reported based on interpreter judgement due to suboptimal tracking. The left ventricular internal cavity size was normal in size. There is moderate left ventricular hypertrophy. Left ventricular diastolic parameters are consistent with Grade II diastolic dysfunction (pseudonormalization). Right Ventricle: The right ventricular size is normal. No increase in right ventricular wall thickness. Right ventricular systolic function is normal. There is moderately elevated pulmonary artery systolic pressure. The tricuspid regurgitant velocity is 3.22 m/s, and with an assumed right atrial pressure of 5 mmHg, the estimated right ventricular systolic pressure is 40.1 mmHg. Left Atrium: Left atrial size was moderately dilated. Right Atrium: Right atrial size was normal in size. Pericardium: There is no evidence of pericardial effusion. Mitral Valve: The mitral valve is abnormal. Moderate mitral annular calcification. Mild mitral valve regurgitation. Mild mitral valve stenosis. MV peak gradient, 14.0 mmHg. The mean mitral valve gradient is 6.0 mmHg. Tricuspid Valve: The tricuspid valve is normal in structure. Tricuspid valve regurgitation is mild . No evidence of tricuspid stenosis. Aortic Valve: The aortic valve is normal in structure. Aortic valve regurgitation is not visualized. Mild to moderate aortic valve sclerosis/calcification is present, without any evidence of aortic stenosis. Aortic valve mean gradient measures 11.0 mmHg.  Aortic valve peak gradient measures 19.3 mmHg. Aortic valve area, by VTI measures 6.37 cm. Pulmonic Valve: The pulmonic valve was normal in structure. Pulmonic valve regurgitation is not visualized. No evidence of pulmonic stenosis. Aorta: The  aortic root is normal in size and structure. Venous: The inferior vena cava was not well visualized. IAS/Shunts: No atrial level shunt detected by color flow Doppler.  LEFT VENTRICLE PLAX 2D LVIDd:         3.29 cm  Diastology LVIDs:         2.37 cm  LV e' medial:    4.03 cm/s LV PW:         1.56 cm  LV E/e' medial:  34.7 LV IVS:        1.23 cm  LV e' lateral:   5.22 cm/s LVOT diam:     2.00 cm  LV E/e' lateral: 26.8 LV SV:         284 LV SV Index:   157 LVOT Area:     3.14 cm                          3D Volume EF:                         3D EF:        59 %                         LV EDV:       193 ml                         LV ESV:       78 ml                         LV SV:        115 ml RIGHT VENTRICLE  RV Basal diam:  3.94 cm RV S prime:     13.60 cm/s TAPSE (M-mode): 3.6 cm LEFT ATRIUM             Index       RIGHT ATRIUM           Index LA diam:        4.60 cm 2.54 cm/m  RA Area:     16.10 cm LA Vol (A2C):   68.8 ml 37.97 ml/m RA Volume:   42.70 ml  23.57 ml/m LA Vol (A4C):   97.8 ml 53.97 ml/m LA Biplane Vol: 83.3 ml 45.97 ml/m  AORTIC VALVE                    PULMONIC VALVE AV Area (Vmax):    5.91 cm     PV Vmax:        0.65 m/s AV Area (Vmean):   5.94 cm     PV Peak grad:   1.7 mmHg AV Area (VTI):     6.37 cm     RVOT Peak grad: 4 mmHg AV Vmax:           219.67 cm/s AV Vmean:          151.667 cm/s AV VTI:            0.446 m AV Peak Grad:      19.3 mmHg AV Mean Grad:      11.0 mmHg LVOT Vmax:         413.00 cm/s LVOT Vmean:        287.000 cm/s LVOT VTI:          0.903 m LVOT/AV VTI ratio: 2.03  AORTA Ao Root diam: 3.07 cm MITRAL VALVE                TRICUSPID VALVE MV Area (PHT): 2.19 cm     TR Peak grad:   41.5 mmHg MV Area VTI:   5.00 cm     TR Vmax:        322.00 cm/s MV Peak grad:  14.0 mmHg MV Mean grad:  6.0 mmHg     SHUNTS MV Vmax:       1.87 m/s     Systemic VTI:  0.90 m MV Vmean:      109.0 cm/s   Systemic Diam: 2.00 cm MV Decel Time: 346 msec MV E velocity: 140.00 cm/s MV A velocity:  154.00 cm/s MV E/A ratio:  0.91 Kathlyn Sacramento MD Electronically signed by Kathlyn Sacramento MD Signature Date/Time: 10/24/2020/1:43:35 PM    Final     Assessment & Plan:   Problem List Items Addressed This Visit      Unprioritized   Blurred vision, bilateral    Patient reports that her visual acuity has improved since left carotid stent placement . She will follow up iwht Dr Mordecai Rasmussen as needed       Renovascular hypertension    Blood pressure control has improved since her carotid stenting.  telmisartan was not started,  And amlodipine was held at discharge from [facility] on March 28.  She remains normotensive on furosemide alone.  No additions today       Relevant Orders   Basic metabolic panel   Carotid stenosis, symptomatic w/o infarct, bilateral    With 90% stenosis of left ICA noted on CTA and a report of amaurosis fugax by her ophthalmologist prompting intervention.  She is now s/p left carotid stent.  Continue Plavix, asa and statin .        Pneumonia    Bilateral  By CT chest.  Symptoms are resolving but she would benefit from a Xopenex nebulized home treatment since she cannot use the MDI due to OA of fingers.  She was given a nebulizer in the office and instructed on how to use it.  Xopenex rx sent to her pharmacy for [place] given need to avoid tachycardia.  Repeat CT chest will be done in 6 weeks to ensure resolution of RUL and LLL infiltrates       Relevant Medications   levalbuterol (XOPENEX) 0.63 MG/3ML nebulizer solution   Proximal leg weakness    Post hospitalization, resulting in use of walker and fear of falling. .  Patient lives independently.  Home health PT ordered       Relevant Orders   Ambulatory referral to [facility] discharge follow-up    Patient is stable post discharge from [facility] on 3/28  and has no new issues or questions about discharge plans at the visit today for hospital follow up. All labs , imaging studies and progress notes from admission were  reviewed with patient today  .  She will return for a BMET on April 4  Lab Results  Component Value Date   NA 134 (L) 10/24/2020   K 4.1 10/24/2020   CL 105 10/24/2020   CO2 23 10/24/2020          Other Visit Diagnoses    Poor balance    -  Primary   Relevant Orders   Ambulatory referral to [place]   Cavitary lesion of lung       Relevant Orders   CT Chest Wo Contrast      I am having Tuwana B. Pieratt start on levalbuterol. I am also having her maintain her Restasis, Cyanocobalamin (VITAMIN B 12 PO), Vitamin D-3, nitroGLYCERIN, Turmeric, Systane Preservative Free, rosuvastatin, temazepam, levothyroxine, omeprazole, ketoconazole, b complex vitamins, aspirin EC, clopidogrel, dextromethorphan-guaiFENesin, guaiFENesin-dextromethorphan, potassium chloride SA, furosemide, and albuterol.  Meds ordered this encounter  Medications  . levalbuterol (XOPENEX) 0.63 MG/3ML nebulizer solution    Sig: Take 3 mLs (0.63 mg total) by nebulization every 6 (six) hours as needed for wheezing or shortness of breath.    Dispense:  75 mL    Refill:  0    QTY SUFFICIENT FOR 25 TREATMENTS    There are no discontinued medications.  Follow-up: Return in about 7 weeks (around 12/16/2020).   [name], MD

## 2020-10-28 NOTE — Patient Instructions (Addendum)
WE will repeat your chest CT in 6 weeks to make sure the spots are gone   You can use the nebulizer  Instead of the albuterol inhaler every 6 hours if needed  (not more)  Your blood pressure is fine  Physical Therapy has been ordered to improve your balance and leg strength

## 2020-10-28 NOTE — Telephone Encounter (Signed)
lft vm for pt to call ofc to sch CT. thanks

## 2020-10-30 DIAGNOSIS — R29898 Other symptoms and signs involving the musculoskeletal system: Secondary | ICD-10-CM | POA: Insufficient documentation

## 2020-10-30 NOTE — Assessment & Plan Note (Signed)
Patient reports that her visual acuity has improved since left carotid stent placement . She will follow up iwht Dr Mordecai Rasmussen as needed

## 2020-10-30 NOTE — Assessment & Plan Note (Signed)
Patient is stable post discharge from [facility] on 3/28  and has no new issues or questions about discharge plans at the visit today for hospital follow up. All labs , imaging studies and progress notes from admission were reviewed with patient today  .  She will return for a BMET on April 4  Lab Results  Component Value Date   NA 134 (L) 10/24/2020   K 4.1 10/24/2020   CL 105 10/24/2020   CO2 23 10/24/2020

## 2020-10-30 NOTE — Assessment & Plan Note (Addendum)
With 90% stenosis of left ICA noted on CTA and a report of amaurosis fugax by her ophthalmologist prompting intervention.  She is now s/p left carotid stent.  Continue Plavix, asa and statin .

## 2020-10-30 NOTE — Assessment & Plan Note (Addendum)
Bilateral  By CT chest.  Symptoms are resolving but she would benefit from a Xopenex nebulized home treatment since she cannot use the MDI due to OA of fingers.  She was given a nebulizer in the office and instructed on how to use it.  Xopenex rx sent to her pharmacy for [place] given need to avoid tachycardia.  Repeat CT chest will be done in 6 weeks to ensure resolution of RUL and LLL infiltrates

## 2020-10-30 NOTE — Assessment & Plan Note (Signed)
Post hospitalization, resulting in use of walker and fear of falling. .  Patient lives independently.  Home health PT ordered

## 2020-10-30 NOTE — Assessment & Plan Note (Addendum)
Blood pressure control has improved since her carotid stenting.  telmisartan was not started,  And amlodipine was held at discharge from [facility] on March 28.  She remains normotensive on furosemide alone.  No additions today

## 2020-10-31 ENCOUNTER — Telehealth: Payer: Self-pay

## 2020-10-31 NOTE — Telephone Encounter (Signed)
Patient returned office phone call, patient is scheduled for labs. Patient was checking on her referral for physical therapy.

## 2020-10-31 NOTE — Telephone Encounter (Signed)
LMTCB. Need to schedule pt for a non fasting lab appt this week per Dr. Derrel Nip. Lab has been ordered.

## 2020-11-01 NOTE — Telephone Encounter (Signed)
Spoke with pt and explained to her that [facility] will be calling her to set up her PT. Pt stated that she still has not heard anything. I advised pt that we usually tell our pt's once a referral is placed to give a week and if they do not hear from them to let us know. Pt stated that she will give them until Thursday and when she comes in for lab work she will let us know if they have contacted her yet.

## 2020-11-03 ENCOUNTER — Other Ambulatory Visit (INDEPENDENT_AMBULATORY_CARE_PROVIDER_SITE_OTHER): Payer: Medicare Other

## 2020-11-03 ENCOUNTER — Other Ambulatory Visit: Payer: Self-pay

## 2020-11-03 DIAGNOSIS — R944 Abnormal results of kidney function studies: Secondary | ICD-10-CM

## 2020-11-03 DIAGNOSIS — I150 Renovascular hypertension: Secondary | ICD-10-CM

## 2020-11-03 LAB — BASIC METABOLIC PANEL
BUN: 12 mg/dL (ref 6–23)
CO2: 30 mEq/L (ref 19–32)
Calcium: 9.4 mg/dL (ref 8.4–10.5)
Chloride: 98 mEq/L (ref 96–112)
Creatinine, Ser: 0.93 mg/dL (ref 0.40–1.20)
GFR: 54.86 mL/min — ABNORMAL LOW (ref >60.00)
Glucose, Bld: 90 mg/dL (ref 70–99)
Potassium: 4.6 mEq/L (ref 3.5–5.1)
Sodium: 134 mEq/L — ABNORMAL LOW (ref 135–145)

## 2020-11-03 NOTE — Addendum Note (Signed)
Addended by: [name] on: 11/03/2020 04:38 PM   Modules accepted: Orders

## 2020-11-03 NOTE — Assessment & Plan Note (Signed)
Slight increase in CR and potassium noted one week post discharge.  Repeat in 2 weeks  Taking furosemide, NOT taking telmisartan,

## 2020-11-04 ENCOUNTER — Telehealth: Payer: Self-pay

## 2020-11-04 NOTE — Telephone Encounter (Signed)
LMTCB in regards to lab results.  

## 2020-11-04 NOTE — Telephone Encounter (Signed)
Patient returned office phone call for results 

## 2020-11-04 NOTE — Telephone Encounter (Signed)
See result note message 

## 2020-11-07 DIAGNOSIS — I6523 Occlusion and stenosis of bilateral carotid arteries: Secondary | ICD-10-CM | POA: Diagnosis not present

## 2020-11-07 DIAGNOSIS — M353 Polymyalgia rheumatica: Secondary | ICD-10-CM | POA: Diagnosis not present

## 2020-11-07 DIAGNOSIS — J189 Pneumonia, unspecified organism: Secondary | ICD-10-CM | POA: Diagnosis not present

## 2020-11-07 DIAGNOSIS — I150 Renovascular hypertension: Secondary | ICD-10-CM | POA: Diagnosis not present

## 2020-11-07 DIAGNOSIS — Z95828 Presence of other vascular implants and grafts: Secondary | ICD-10-CM | POA: Diagnosis not present

## 2020-11-07 DIAGNOSIS — I5032 Chronic diastolic (congestive) heart failure: Secondary | ICD-10-CM | POA: Diagnosis not present

## 2020-11-07 DIAGNOSIS — I739 Peripheral vascular disease, unspecified: Secondary | ICD-10-CM | POA: Diagnosis not present

## 2020-11-07 DIAGNOSIS — I2510 Atherosclerotic heart disease of native coronary artery without angina pectoris: Secondary | ICD-10-CM | POA: Diagnosis not present

## 2020-11-07 DIAGNOSIS — Z7902 Long term (current) use of antithrombotics/antiplatelets: Secondary | ICD-10-CM | POA: Diagnosis not present

## 2020-11-07 DIAGNOSIS — I701 Atherosclerosis of renal artery: Secondary | ICD-10-CM | POA: Diagnosis not present

## 2020-11-07 DIAGNOSIS — H93A1 Pulsatile tinnitus, right ear: Secondary | ICD-10-CM | POA: Diagnosis not present

## 2020-11-07 DIAGNOSIS — Z87891 Personal history of nicotine dependence: Secondary | ICD-10-CM | POA: Diagnosis not present

## 2020-11-09 DIAGNOSIS — I6523 Occlusion and stenosis of bilateral carotid arteries: Secondary | ICD-10-CM | POA: Diagnosis not present

## 2020-11-09 DIAGNOSIS — I739 Peripheral vascular disease, unspecified: Secondary | ICD-10-CM | POA: Diagnosis not present

## 2020-11-09 DIAGNOSIS — I701 Atherosclerosis of renal artery: Secondary | ICD-10-CM | POA: Diagnosis not present

## 2020-11-09 DIAGNOSIS — J189 Pneumonia, unspecified organism: Secondary | ICD-10-CM | POA: Diagnosis not present

## 2020-11-09 DIAGNOSIS — I5032 Chronic diastolic (congestive) heart failure: Secondary | ICD-10-CM | POA: Diagnosis not present

## 2020-11-09 DIAGNOSIS — I150 Renovascular hypertension: Secondary | ICD-10-CM | POA: Diagnosis not present

## 2020-11-14 DIAGNOSIS — I739 Peripheral vascular disease, unspecified: Secondary | ICD-10-CM | POA: Diagnosis not present

## 2020-11-14 DIAGNOSIS — Z87891 Personal history of nicotine dependence: Secondary | ICD-10-CM | POA: Diagnosis not present

## 2020-11-14 DIAGNOSIS — Z7902 Long term (current) use of antithrombotics/antiplatelets: Secondary | ICD-10-CM | POA: Diagnosis not present

## 2020-11-14 DIAGNOSIS — I2510 Atherosclerotic heart disease of native coronary artery without angina pectoris: Secondary | ICD-10-CM | POA: Diagnosis not present

## 2020-11-14 DIAGNOSIS — I6523 Occlusion and stenosis of bilateral carotid arteries: Secondary | ICD-10-CM | POA: Diagnosis not present

## 2020-11-14 DIAGNOSIS — M353 Polymyalgia rheumatica: Secondary | ICD-10-CM | POA: Diagnosis not present

## 2020-11-14 DIAGNOSIS — I5032 Chronic diastolic (congestive) heart failure: Secondary | ICD-10-CM | POA: Diagnosis not present

## 2020-11-14 DIAGNOSIS — I701 Atherosclerosis of renal artery: Secondary | ICD-10-CM | POA: Diagnosis not present

## 2020-11-14 DIAGNOSIS — J189 Pneumonia, unspecified organism: Secondary | ICD-10-CM | POA: Diagnosis not present

## 2020-11-14 DIAGNOSIS — H93A1 Pulsatile tinnitus, right ear: Secondary | ICD-10-CM | POA: Diagnosis not present

## 2020-11-14 DIAGNOSIS — Z95828 Presence of other vascular implants and grafts: Secondary | ICD-10-CM | POA: Diagnosis not present

## 2020-11-14 DIAGNOSIS — I150 Renovascular hypertension: Secondary | ICD-10-CM | POA: Diagnosis not present

## 2020-11-15 DIAGNOSIS — H04123 Dry eye syndrome of bilateral lacrimal glands: Secondary | ICD-10-CM | POA: Diagnosis not present

## 2020-11-16 DIAGNOSIS — J189 Pneumonia, unspecified organism: Secondary | ICD-10-CM | POA: Diagnosis not present

## 2020-11-16 DIAGNOSIS — I6523 Occlusion and stenosis of bilateral carotid arteries: Secondary | ICD-10-CM | POA: Diagnosis not present

## 2020-11-16 DIAGNOSIS — I5032 Chronic diastolic (congestive) heart failure: Secondary | ICD-10-CM | POA: Diagnosis not present

## 2020-11-16 DIAGNOSIS — I701 Atherosclerosis of renal artery: Secondary | ICD-10-CM | POA: Diagnosis not present

## 2020-11-16 DIAGNOSIS — I150 Renovascular hypertension: Secondary | ICD-10-CM | POA: Diagnosis not present

## 2020-11-16 DIAGNOSIS — I739 Peripheral vascular disease, unspecified: Secondary | ICD-10-CM | POA: Diagnosis not present

## 2020-11-17 ENCOUNTER — Other Ambulatory Visit: Payer: Medicare Other

## 2020-11-17 ENCOUNTER — Other Ambulatory Visit: Payer: Self-pay

## 2020-11-17 ENCOUNTER — Other Ambulatory Visit (INDEPENDENT_AMBULATORY_CARE_PROVIDER_SITE_OTHER): Payer: Medicare Other

## 2020-11-17 DIAGNOSIS — R944 Abnormal results of kidney function studies: Secondary | ICD-10-CM | POA: Diagnosis not present

## 2020-11-17 LAB — BASIC METABOLIC PANEL
BUN: 13 mg/dL (ref 6–23)
CO2: 30 mEq/L (ref 19–32)
Calcium: 9.4 mg/dL (ref 8.4–10.5)
Chloride: 97 mEq/L (ref 96–112)
Creatinine, Ser: 0.92 mg/dL (ref 0.40–1.20)
GFR: 55.56 mL/min — ABNORMAL LOW (ref >60.00)
Glucose, Bld: 95 mg/dL (ref 70–99)
Potassium: 4.2 mEq/L (ref 3.5–5.1)
Sodium: 133 mEq/L — ABNORMAL LOW (ref 135–145)

## 2020-11-18 ENCOUNTER — Other Ambulatory Visit (INDEPENDENT_AMBULATORY_CARE_PROVIDER_SITE_OTHER): Payer: Self-pay | Admitting: Vascular Surgery

## 2020-11-18 DIAGNOSIS — I6522 Occlusion and stenosis of left carotid artery: Secondary | ICD-10-CM

## 2020-11-18 DIAGNOSIS — Z959 Presence of cardiac and vascular implant and graft, unspecified: Secondary | ICD-10-CM

## 2020-11-21 ENCOUNTER — Ambulatory Visit (INDEPENDENT_AMBULATORY_CARE_PROVIDER_SITE_OTHER): Payer: Medicare Other | Admitting: Vascular Surgery

## 2020-11-21 ENCOUNTER — Encounter (INDEPENDENT_AMBULATORY_CARE_PROVIDER_SITE_OTHER): Payer: Self-pay | Admitting: Vascular Surgery

## 2020-11-21 ENCOUNTER — Ambulatory Visit (INDEPENDENT_AMBULATORY_CARE_PROVIDER_SITE_OTHER): Payer: Medicare Other

## 2020-11-21 ENCOUNTER — Other Ambulatory Visit: Payer: Self-pay

## 2020-11-21 VITALS — BP 161/85 | HR 64 | Ht 65.0 in | Wt 155.0 lb

## 2020-11-21 DIAGNOSIS — I150 Renovascular hypertension: Secondary | ICD-10-CM | POA: Diagnosis not present

## 2020-11-21 DIAGNOSIS — I6523 Occlusion and stenosis of bilateral carotid arteries: Secondary | ICD-10-CM

## 2020-11-21 DIAGNOSIS — J189 Pneumonia, unspecified organism: Secondary | ICD-10-CM | POA: Diagnosis not present

## 2020-11-21 DIAGNOSIS — I5032 Chronic diastolic (congestive) heart failure: Secondary | ICD-10-CM | POA: Diagnosis not present

## 2020-11-21 DIAGNOSIS — I6522 Occlusion and stenosis of left carotid artery: Secondary | ICD-10-CM | POA: Diagnosis not present

## 2020-11-21 DIAGNOSIS — I739 Peripheral vascular disease, unspecified: Secondary | ICD-10-CM | POA: Diagnosis not present

## 2020-11-21 DIAGNOSIS — Z959 Presence of cardiac and vascular implant and graft, unspecified: Secondary | ICD-10-CM | POA: Diagnosis not present

## 2020-11-21 DIAGNOSIS — I701 Atherosclerosis of renal artery: Secondary | ICD-10-CM | POA: Diagnosis not present

## 2020-11-21 NOTE — Progress Notes (Signed)
Patient ID: Kathryn Wise, female   DOB: [birth year], 87 y.o.   MRN: [id number]  No chief complaint on file.   HPI Kathryn Wise is a 87 y.o. female.    The patient is seen for follow up evaluation of carotid stenosis status post left carotid stent on 10/19/2020.  There were no post operative problems or complications related to the surgery.  The patient denies groin pain.  Procedure: 1. Placement of a 7 x 9 x 40 exact stent with the use of the NAV-6 embolic protection device in the left internal carotid artery  The patient denies interval amaurosis fugax. There is no recent history of TIA symptoms or focal motor deficits. There is no prior documented CVA.  The patient denies headache.  The patient is taking enteric-coated aspirin 81 mg daily.  The patient has a history of coronary artery disease, no recent episodes of angina or shortness of breath. The patient denies PAD or claudication symptoms. There is a history of hyperlipidemia which is being treated with a statin.     Past Medical History:  Diagnosis Date  . Arthritis   . Collagen vascular disease ([place])   . Dysphagia, pharyngoesophageal phase 09/10/2013   Upper GI study with barium swallow was done at  [facility] Mar 2015   No reflux seen  Small irreducible hiatal hernia  Mild changes of presbyeophagus (abnormal contractions of the esophagus that occur with aging) No strictures Normal gastric emptying  Incomplete visualization of stomach fold due to patient's inability turn    . GERD (gastroesophageal reflux disease)   . Heart murmur    has had years and years  . History of shingles Dec 2013   treated with steroids , post op from shoulder surgery  . History of squamous cell carcinoma 05/02/2016   right mid lateral pretibial  . Hypertension   . Hypothyroidism   . Major depressive disorder, single episode 11/05/2015  . Osteoarthritis of left shoulder 07/14/2012  . Squamous cell carcinoma of skin 05/12/2016   R mid lat  pretibial - other skin cancers treated by Dr. Sharlett Iles  . Squamous cell carcinoma of skin 08/22/2020   left distal medial popliteal - [facility]    Past Surgical History:  Procedure Laterality Date  . ARTHOSCOPIC ROTAOR CUFF REPAIR     LEFT  10+  YEARS    . CAROTID PTA/STENT INTERVENTION Left 10/19/2020   Procedure: CAROTID PTA/STENT INTERVENTION;  Surgeon: Katha Cabal, MD;  Location: [place] [facility];  Service: Cardiovascular;  Laterality: Left;  . CATARACT EXTRACTION W/ INTRAOCULAR LENS IMPLANT     RIGHT EYE  . CATARACT EXTRACTION W/PHACO Left 03/28/2016   Procedure: CATARACT EXTRACTION PHACO AND INTRAOCULAR LENS PLACEMENT (IOC);  Surgeon: Leandrew Koyanagi, MD;  Location: [place];  Service: Ophthalmology;  Laterality: Left;  TORIC  . FACELIFT    . FOOT SURGERY     rt foot   TUMOR REMOVED  . JOINT REPLACEMENT Left Dec 2013   shoulder  . LEFT HEART CATH AND CORONARY ANGIOGRAPHY N/A 06/12/2017   Procedure: LEFT HEART CATH AND CORONARY ANGIOGRAPHY;  Surgeon: Minna Merritts, MD;  Location: [place] [facility];  Service: Cardiovascular;  Laterality: N/A;  . RENAL ANGIOGRAPHY Left 01/07/2019   Procedure: RENAL ANGIOGRAPHY;  Surgeon: Katha Cabal, MD;  Location: [place] [facility];  Service: Cardiovascular;  Laterality: Left;  . RENAL ARTERY STENT    . SKIN CANCER EXCISION    . TOTAL SHOULDER  ARTHROPLASTY  07/14/2012   Procedure: TOTAL SHOULDER ARTHROPLASTY;  Surgeon: Johnny Bridge, MD;  Location: [place];  Service: Orthopedics;  Laterality: Left;      Allergies  Allergen Reactions  . Alendronate Swelling    And nausea    . Lisinopril     Spike in BP  . Metoprolol Other (See Comments)    Lethargy - PT CURRENTLY TAKING   . Oxycodone     Other reaction(s): Other (See Comments) Altered mental status  . Oxycontin [Oxycodone Hcl]     Altered mental status    Current Outpatient Medications  Medication Sig Dispense Refill  . albuterol (VENTOLIN HFA)  108 (90 Base) MCG/ACT inhaler Inhale 2 puffs into the lungs every 6 (six) hours as needed for wheezing or shortness of breath. 8 g 0  . aspirin EC 81 MG tablet Take 1 tablet (81 mg total) by mouth daily. Swallow whole. 90 tablet 3  . b complex vitamins capsule Take 1 capsule by mouth daily.    . Cholecalciferol (VITAMIN D-3) 1000 units CAPS Take 1,000 Units by mouth daily.    . clopidogrel (PLAVIX) 75 MG tablet Take 1 tablet (75 mg total) by mouth daily. 90 tablet 3  . Cyanocobalamin (VITAMIN B 12 PO) Take 1,000 mcg by mouth daily.    . furosemide (LASIX) 20 MG tablet Take 1 tablet (20 mg total) by mouth daily. 90 tablet 1  . guaiFENesin-dextromethorphan (ROBITUSSIN DM) 100-10 MG/5ML syrup Take 5 mLs by mouth every 4 (four) hours as needed for cough. 118 mL 0  . ketoconazole (NIZORAL) 2 % cream Apply to the feet and between the toes every night. (Patient taking differently: Apply 1 application topically at bedtime. Apply to the feet and between the toes every night.) 60 g 3  . levalbuterol (XOPENEX) 0.63 MG/3ML nebulizer solution Take 3 mLs (0.63 mg total) by nebulization every 6 (six) hours as needed for wheezing or shortness of breath. 75 mL 0  . levothyroxine (SYNTHROID) 88 MCG tablet TAKE 1 TABLET BY MOUTH ONCE DAILY ON AN EMPTY STOMACH. WAIT 30 MINUTES BEFORE TAKING OTHER MEDS. (Patient taking differently: Take 88 mcg by mouth daily before breakfast. TAKE 1 TABLET BY MOUTH ONCE DAILY ON AN EMPTY STOMACH. WAIT 30 MINUTES BEFORE TAKING OTHER MEDS.) 90 tablet 1  . nitroGLYCERIN (NITROSTAT) 0.4 MG SL tablet Place 1 tablet (0.4 mg total) every 5 (five) minutes as needed under the tongue for chest pain. 30 tablet 2  . omeprazole (PRILOSEC) 20 MG capsule TAKE 1 CAPSULE BY MOUTH ONCE EVERY MORNING (Patient taking differently: Take 20 mg by mouth daily as needed (heartburn). TAKE 1 CAPSULE BY MOUTH ONCE EVERY MORNING AS NEEDED) 90 capsule 2  . potassium chloride SA (KLOR-CON) 20 MEQ tablet Take 1 tablet (20  mEq total) by mouth daily. 30 tablet 1  . RESTASIS 0.05 % ophthalmic emulsion Place 1 drop into both eyes 2 (two) times daily.     . rosuvastatin (CRESTOR) 10 MG tablet Take 10 mg by mouth daily.    [name] PRESERVATIVE FREE 0.4-0.3 % SOLN Place 1 drop into both eyes 4 (four) times daily as needed (dry eyes).    . temazepam (RESTORIL) 15 MG capsule TAKE 1 CAPSULE BY MOUTH AT BEDTIME AS NEEDED SLEEP (Patient taking differently: Take by mouth at bedtime as needed for sleep.) 30 capsule 5  . Turmeric 500 MG CAPS Take 500 mg by mouth daily.     No current facility-administered medications for this visit.  Physical Exam There were no vitals taken for this visit. Gen:  WD/WN, NAD Skin: incision C/D/I Neuro: intact    Assessment/Plan: 1. Carotid stenosis, symptomatic w/o infarct, bilateral Recommend:  The patient is s/p successful left carotid stent  Duplex ultrasound preoperatively shows 50-69% contralateral stenosis.  Continue antiplatelet therapy as prescribed Continue management of CAD, HTN and Hyperlipidemia Healthy heart diet,  encouraged exercise at least 4 times per week  Follow up in 60months with duplex ultrasound and physical exam based on the patient's carotid stenting and >50% stenosis of the right carotid artery   - VAS US CAROTID; Future      [name] 11/21/2020, 3:13 PM   This note was created with Dragon medical transcription system.  Any errors from dictation are unintentional.

## 2020-11-28 DIAGNOSIS — I701 Atherosclerosis of renal artery: Secondary | ICD-10-CM | POA: Diagnosis not present

## 2020-11-28 DIAGNOSIS — I150 Renovascular hypertension: Secondary | ICD-10-CM | POA: Diagnosis not present

## 2020-11-28 DIAGNOSIS — I6523 Occlusion and stenosis of bilateral carotid arteries: Secondary | ICD-10-CM | POA: Diagnosis not present

## 2020-11-28 DIAGNOSIS — I5032 Chronic diastolic (congestive) heart failure: Secondary | ICD-10-CM | POA: Diagnosis not present

## 2020-11-28 DIAGNOSIS — I739 Peripheral vascular disease, unspecified: Secondary | ICD-10-CM | POA: Diagnosis not present

## 2020-11-28 DIAGNOSIS — J189 Pneumonia, unspecified organism: Secondary | ICD-10-CM | POA: Diagnosis not present

## 2020-12-05 DIAGNOSIS — I701 Atherosclerosis of renal artery: Secondary | ICD-10-CM | POA: Diagnosis not present

## 2020-12-05 DIAGNOSIS — J189 Pneumonia, unspecified organism: Secondary | ICD-10-CM | POA: Diagnosis not present

## 2020-12-05 DIAGNOSIS — I5032 Chronic diastolic (congestive) heart failure: Secondary | ICD-10-CM | POA: Diagnosis not present

## 2020-12-05 DIAGNOSIS — I150 Renovascular hypertension: Secondary | ICD-10-CM | POA: Diagnosis not present

## 2020-12-05 DIAGNOSIS — I739 Peripheral vascular disease, unspecified: Secondary | ICD-10-CM | POA: Diagnosis not present

## 2020-12-05 DIAGNOSIS — I6523 Occlusion and stenosis of bilateral carotid arteries: Secondary | ICD-10-CM | POA: Diagnosis not present

## 2020-12-07 DIAGNOSIS — Z7902 Long term (current) use of antithrombotics/antiplatelets: Secondary | ICD-10-CM | POA: Diagnosis not present

## 2020-12-07 DIAGNOSIS — M353 Polymyalgia rheumatica: Secondary | ICD-10-CM | POA: Diagnosis not present

## 2020-12-07 DIAGNOSIS — Z95828 Presence of other vascular implants and grafts: Secondary | ICD-10-CM | POA: Diagnosis not present

## 2020-12-07 DIAGNOSIS — I5032 Chronic diastolic (congestive) heart failure: Secondary | ICD-10-CM | POA: Diagnosis not present

## 2020-12-07 DIAGNOSIS — H93A1 Pulsatile tinnitus, right ear: Secondary | ICD-10-CM | POA: Diagnosis not present

## 2020-12-07 DIAGNOSIS — I6523 Occlusion and stenosis of bilateral carotid arteries: Secondary | ICD-10-CM | POA: Diagnosis not present

## 2020-12-07 DIAGNOSIS — J189 Pneumonia, unspecified organism: Secondary | ICD-10-CM | POA: Diagnosis not present

## 2020-12-07 DIAGNOSIS — Z87891 Personal history of nicotine dependence: Secondary | ICD-10-CM | POA: Diagnosis not present

## 2020-12-07 DIAGNOSIS — I739 Peripheral vascular disease, unspecified: Secondary | ICD-10-CM | POA: Diagnosis not present

## 2020-12-07 DIAGNOSIS — I2510 Atherosclerotic heart disease of native coronary artery without angina pectoris: Secondary | ICD-10-CM | POA: Diagnosis not present

## 2020-12-07 DIAGNOSIS — I150 Renovascular hypertension: Secondary | ICD-10-CM | POA: Diagnosis not present

## 2020-12-07 DIAGNOSIS — I701 Atherosclerosis of renal artery: Secondary | ICD-10-CM | POA: Diagnosis not present

## 2020-12-12 DIAGNOSIS — I701 Atherosclerosis of renal artery: Secondary | ICD-10-CM | POA: Diagnosis not present

## 2020-12-12 DIAGNOSIS — I5032 Chronic diastolic (congestive) heart failure: Secondary | ICD-10-CM | POA: Diagnosis not present

## 2020-12-12 DIAGNOSIS — J189 Pneumonia, unspecified organism: Secondary | ICD-10-CM | POA: Diagnosis not present

## 2020-12-12 DIAGNOSIS — I739 Peripheral vascular disease, unspecified: Secondary | ICD-10-CM | POA: Diagnosis not present

## 2020-12-12 DIAGNOSIS — I150 Renovascular hypertension: Secondary | ICD-10-CM | POA: Diagnosis not present

## 2020-12-12 DIAGNOSIS — I6523 Occlusion and stenosis of bilateral carotid arteries: Secondary | ICD-10-CM | POA: Diagnosis not present

## 2020-12-13 ENCOUNTER — Ambulatory Visit
Admission: RE | Admit: 2020-12-13 | Discharge: 2020-12-13 | Disposition: A | Discharge status: Home / Self Care | Payer: Medicare Other | Source: Ambulatory Visit | Attending: Internal Medicine | Admitting: Internal Medicine

## 2020-12-13 ENCOUNTER — Other Ambulatory Visit: Payer: Self-pay

## 2020-12-13 DIAGNOSIS — J984 Other disorders of lung: Secondary | ICD-10-CM | POA: Insufficient documentation

## 2020-12-13 DIAGNOSIS — Z8701 Personal history of pneumonia (recurrent): Secondary | ICD-10-CM | POA: Diagnosis not present

## 2020-12-13 DIAGNOSIS — I2510 Atherosclerotic heart disease of native coronary artery without angina pectoris: Secondary | ICD-10-CM | POA: Diagnosis not present

## 2020-12-13 DIAGNOSIS — Z9889 Other specified postprocedural states: Secondary | ICD-10-CM | POA: Diagnosis not present

## 2020-12-13 DIAGNOSIS — K449 Diaphragmatic hernia without obstruction or gangrene: Secondary | ICD-10-CM | POA: Diagnosis not present

## 2020-12-19 ENCOUNTER — Encounter: Payer: Self-pay | Admitting: Internal Medicine

## 2020-12-19 ENCOUNTER — Ambulatory Visit (INDEPENDENT_AMBULATORY_CARE_PROVIDER_SITE_OTHER): Payer: Medicare Other | Admitting: Internal Medicine

## 2020-12-19 DIAGNOSIS — I6523 Occlusion and stenosis of bilateral carotid arteries: Secondary | ICD-10-CM | POA: Diagnosis not present

## 2020-12-19 DIAGNOSIS — K449 Diaphragmatic hernia without obstruction or gangrene: Secondary | ICD-10-CM

## 2020-12-19 DIAGNOSIS — I739 Peripheral vascular disease, unspecified: Secondary | ICD-10-CM | POA: Diagnosis not present

## 2020-12-19 DIAGNOSIS — J432 Centrilobular emphysema: Secondary | ICD-10-CM | POA: Diagnosis not present

## 2020-12-19 DIAGNOSIS — I701 Atherosclerosis of renal artery: Secondary | ICD-10-CM | POA: Diagnosis not present

## 2020-12-19 DIAGNOSIS — J189 Pneumonia, unspecified organism: Secondary | ICD-10-CM | POA: Diagnosis not present

## 2020-12-19 DIAGNOSIS — I5032 Chronic diastolic (congestive) heart failure: Secondary | ICD-10-CM | POA: Diagnosis not present

## 2020-12-19 DIAGNOSIS — I150 Renovascular hypertension: Secondary | ICD-10-CM | POA: Diagnosis not present

## 2020-12-19 NOTE — Progress Notes (Signed)
Telephone visit   This visit type was conducted due to national recommendations for restrictions regarding the COVID-19 pandemic (e.g. social distancing).  This format is felt to be most appropriate for this patient at this time.  All issues noted in this document were discussed and addressed.  No physical exam was performed (except for noted visual exam findings with Video Visits).   I connected with@ on 12/19/20 at  3:30 PM EDT by telephone  and verified that I am speaking with the correct person using two identifiers. Location patient: home Location provider: work or home office Persons participating in the virtual visit: patient, provider  I discussed the limitations, risks, security and privacy concerns of performing an evaluation and management service by telephone and the availability of in person appointments. I also discussed with the patient that there may be a patient responsible charge related to this service. The patient expressed understanding and agreed to proceed.  Reason for visit: follow up on chest CT   HPI:  Kathryn Wise  is a delightful 87 yr old female with a history of  Hypertension, aortic/cerebral /coronary  Atherosclerosis who was scheduled today to review recent Chest CT pwhich was done to follow up on reviously seen RLL cavitary nodule, which has now resolved.     Cc:  "I still can't breathe"    DENIES dyspnea at rest or with ADLs//  Walks 1/2 mile to mailbox,  Gets SOB on the way back which is uphill .  No longer using xonenex nebulizer , made her mouth too dry to swallow. ("the treatment was worse than the condition)  [facility].  Reviewed recent hospitalization for pneumonia,  Findings on follow up CT which noted resolution of RLL "cavitary nodule" and incidental finding of centrilobular emphysema.   Reviewed her smoking history , which is remote.  She has deferred treatment for COPD given her recent intolerance to bronchodilator  therapy.    Appetite poor but sustaining herself. Nothing tastes good  weight loss. Discussed use of high protein drinks to maintain nutritional status .  ROS: See pertinent positives and negatives per HPI.  Past Medical History:  Diagnosis Date  . Arthritis   . Collagen vascular disease ([place])   . Dysphagia, pharyngoesophageal phase 09/10/2013   Upper GI study with barium swallow was done at  [facility] Mar 2015   No reflux seen  Small irreducible hiatal hernia  Mild changes of presbyeophagus (abnormal contractions of the esophagus that occur with aging) No strictures Normal gastric emptying  Incomplete visualization of stomach fold due to patient's inability turn    . GERD (gastroesophageal reflux disease)   . Heart murmur    has had years and years  . History of shingles Dec 2013   treated with steroids , post op from shoulder surgery  . History of squamous cell carcinoma 05/02/2016   right mid lateral pretibial  . Hypertension   . Hypothyroidism   . Major depressive disorder, single episode 11/05/2015  . Osteoarthritis of left shoulder 07/14/2012  . Pneumonia 10/21/2020  . Squamous cell carcinoma of skin 05/12/2016   R mid lat pretibial - other skin cancers treated by Dr. Sharlett Iles  . Squamous cell carcinoma of skin 08/22/2020   left distal medial popliteal - [facility]    Past Surgical History:  Procedure Laterality Date  . ARTHOSCOPIC ROTAOR CUFF REPAIR     LEFT  10+  YEARS    . CAROTID PTA/STENT INTERVENTION Left  10/19/2020   Procedure: CAROTID PTA/STENT INTERVENTION;  Surgeon: Katha Cabal, MD;  Location: [place] [facility];  Service: Cardiovascular;  Laterality: Left;  . CATARACT EXTRACTION W/ INTRAOCULAR LENS IMPLANT     RIGHT EYE  . CATARACT EXTRACTION W/PHACO Left 03/28/2016   Procedure: CATARACT EXTRACTION PHACO AND INTRAOCULAR LENS PLACEMENT (IOC);  Surgeon: Leandrew Koyanagi, MD;  Location: [place];  Service: Ophthalmology;  Laterality: Left;  TORIC  .  FACELIFT    . FOOT SURGERY     rt foot   TUMOR REMOVED  . JOINT REPLACEMENT Left Dec 2013   shoulder  . LEFT HEART CATH AND CORONARY ANGIOGRAPHY N/A 06/12/2017   Procedure: LEFT HEART CATH AND CORONARY ANGIOGRAPHY;  Surgeon: Minna Merritts, MD;  Location: [place] [facility];  Service: Cardiovascular;  Laterality: N/A;  . RENAL ANGIOGRAPHY Left 01/07/2019   Procedure: RENAL ANGIOGRAPHY;  Surgeon: Katha Cabal, MD;  Location: [place] [facility];  Service: Cardiovascular;  Laterality: Left;  . RENAL ARTERY STENT    . SKIN CANCER EXCISION    . TOTAL SHOULDER ARTHROPLASTY  07/14/2012   Procedure: TOTAL SHOULDER ARTHROPLASTY;  Surgeon: Johnny Bridge, MD;  Location: [place];  Service: Orthopedics;  Laterality: Left;    Family History  Problem Relation Age of Onset  . Cancer Daughter        Breast  . Breast cancer Daughter 79  . Cancer Son     SOCIAL HX: reports that she quit smoking about 48 years ago. Her smoking use included cigarettes. She has never used smokeless tobacco. She reports current alcohol use. She reports that she does not use drugs.   Current Outpatient Medications:  .  aspirin EC 81 MG tablet, Take 1 tablet (81 mg total) by mouth daily. Swallow whole., Disp: 90 tablet, Rfl: 3 .  b complex vitamins capsule, Take 1 capsule by mouth daily., Disp: , Rfl:  .  Cholecalciferol (VITAMIN D-3) 1000 units CAPS, Take 1,000 Units by mouth daily., Disp: , Rfl:  .  clopidogrel (PLAVIX) 75 MG tablet, Take 1 tablet (75 mg total) by mouth daily., Disp: 90 tablet, Rfl: 3 .  Cyanocobalamin (VITAMIN B 12 PO), Take 1,000 mcg by mouth daily., Disp: , Rfl:  .  furosemide (LASIX) 20 MG tablet, Take 1 tablet (20 mg total) by mouth daily., Disp: 90 tablet, Rfl: 1 .  ketoconazole (NIZORAL) 2 % cream, Apply to the feet and between the toes every night. (Patient taking differently: Apply 1 application topically at bedtime. Apply to the feet and between the toes every night.), Disp: 60 g,  Rfl: 3 .  levalbuterol (XOPENEX) 0.63 MG/3ML nebulizer solution, Take 3 mLs (0.63 mg total) by nebulization every 6 (six) hours as needed for wheezing or shortness of breath., Disp: 75 mL, Rfl: 0 .  levothyroxine (SYNTHROID) 88 MCG tablet, TAKE 1 TABLET BY MOUTH ONCE DAILY ON AN EMPTY STOMACH. WAIT 30 MINUTES BEFORE TAKING OTHER MEDS. (Patient taking differently: Take 88 mcg by mouth daily before breakfast. TAKE 1 TABLET BY MOUTH ONCE DAILY ON AN EMPTY STOMACH. WAIT 30 MINUTES BEFORE TAKING OTHER MEDS.), Disp: 90 tablet, Rfl: 1 .  nitroGLYCERIN (NITROSTAT) 0.4 MG SL tablet, Place 1 tablet (0.4 mg total) every 5 (five) minutes as needed under the tongue for chest pain., Disp: 30 tablet, Rfl: 2 .  omeprazole (PRILOSEC) 20 MG capsule, TAKE 1 CAPSULE BY MOUTH ONCE EVERY MORNING (Patient taking differently: Take 20 mg by mouth daily as needed (heartburn).  TAKE 1 CAPSULE BY MOUTH ONCE EVERY MORNING AS NEEDED), Disp: 90 capsule, Rfl: 2 .  potassium chloride SA (KLOR-CON) 20 MEQ tablet, Take 1 tablet (20 mEq total) by mouth daily., Disp: 30 tablet, Rfl: 1 .  RESTASIS 0.05 % ophthalmic emulsion, Place 1 drop into both eyes 2 (two) times daily. , Disp: , Rfl:  .  rosuvastatin (CRESTOR) 10 MG tablet, Take 10 mg by mouth daily., Disp: , Rfl:  .  SYSTANE PRESERVATIVE FREE 0.4-0.3 % SOLN, Place 1 drop into both eyes 4 (four) times daily as needed (dry eyes)., Disp: , Rfl:  .  temazepam (RESTORIL) 15 MG capsule, TAKE 1 CAPSULE BY MOUTH AT BEDTIME AS NEEDED SLEEP, Disp: 30 capsule, Rfl: 5 .  Turmeric 500 MG CAPS, Take 500 mg by mouth daily., Disp: , Rfl:  .  albuterol (VENTOLIN HFA) 108 (90 Base) MCG/ACT inhaler, Inhale 2 puffs into the lungs every 6 (six) hours as needed for wheezing or shortness of breath. (Patient not taking: No sig reported), Disp: 8 g, Rfl: 0 .  guaiFENesin-dextromethorphan (ROBITUSSIN DM) 100-10 MG/5ML syrup, Take 5 mLs by mouth every 4 (four) hours as needed for cough. (Patient not taking: No sig  reported), Disp: 118 mL, Rfl: 0  EXAM:   General impression: alert, cooperative and articulate.  No signs of being in distress  Lungs: speech is fluent sentence length suggests that patient is not short of breat  h and not punctuated by cough, sneezing or sniffing. [name]   Psych: affect normal.  speech is articulate and non pressured .  Denies suicidal thoughts  ASSESSMENT AND PLAN:  Discussed the following assessment and plan:  Centrilobular emphysema ([place])  Hiatal hernia  Hiatal hernia Asymptomatic incidental finding on chest CT . Reviewed management with patient.   Centrilobular emphysema ([place]) Reviewed findings on CT.  She defers treatment at this time as she did not tolerate Xopenex nebulizers.  She maintains that her dyspnea is due to deconditioning from her recent pneumonia and [facility]     I discussed the assessment and treatment plan with the patient. The patient was provided an opportunity to ask questions and all were answered. The patient agreed with the plan and demonstrated an understanding of the instructions.   The patient was advised to call back or seek an in-person evaluation if the symptoms worsen or if the condition fails to improve as anticipated.   I spent 30 minutes dedicated to the care of this patient on the date of this encounter to include pre-visit review of her medical history,  Including review of CT scan ,  visit ordering of testing and therapeutics.    [name], MD

## 2020-12-19 NOTE — Assessment & Plan Note (Addendum)
Asymptomatic incidental finding on chest CT . Reviewed management with patient.

## 2020-12-20 NOTE — Assessment & Plan Note (Signed)
Reviewed findings on CT.  She defers treatment at this time as she did not tolerate Xopenex nebulizers.  She maintains that her dyspnea is due to deconditioning from her recent pneumonia and [facility].

## 2020-12-26 DIAGNOSIS — I150 Renovascular hypertension: Secondary | ICD-10-CM | POA: Diagnosis not present

## 2020-12-26 DIAGNOSIS — I739 Peripheral vascular disease, unspecified: Secondary | ICD-10-CM | POA: Diagnosis not present

## 2020-12-26 DIAGNOSIS — I701 Atherosclerosis of renal artery: Secondary | ICD-10-CM | POA: Diagnosis not present

## 2020-12-26 DIAGNOSIS — I6523 Occlusion and stenosis of bilateral carotid arteries: Secondary | ICD-10-CM | POA: Diagnosis not present

## 2020-12-26 DIAGNOSIS — J189 Pneumonia, unspecified organism: Secondary | ICD-10-CM | POA: Diagnosis not present

## 2020-12-26 DIAGNOSIS — I5032 Chronic diastolic (congestive) heart failure: Secondary | ICD-10-CM | POA: Diagnosis not present

## 2021-01-03 DIAGNOSIS — J189 Pneumonia, unspecified organism: Secondary | ICD-10-CM | POA: Diagnosis not present

## 2021-01-03 DIAGNOSIS — I5032 Chronic diastolic (congestive) heart failure: Secondary | ICD-10-CM | POA: Diagnosis not present

## 2021-01-03 DIAGNOSIS — I739 Peripheral vascular disease, unspecified: Secondary | ICD-10-CM | POA: Diagnosis not present

## 2021-01-03 DIAGNOSIS — I150 Renovascular hypertension: Secondary | ICD-10-CM | POA: Diagnosis not present

## 2021-01-03 DIAGNOSIS — I701 Atherosclerosis of renal artery: Secondary | ICD-10-CM | POA: Diagnosis not present

## 2021-01-03 DIAGNOSIS — I6523 Occlusion and stenosis of bilateral carotid arteries: Secondary | ICD-10-CM | POA: Diagnosis not present

## 2021-01-06 ENCOUNTER — Other Ambulatory Visit: Payer: Self-pay | Admitting: Internal Medicine

## 2021-01-06 NOTE — Telephone Encounter (Signed)
Last OV 12/19/2020 Next OV 03/27/21 Last refill 05/23/20  Patient only gets medication as needed and she only has one left.

## 2021-01-16 ENCOUNTER — Ambulatory Visit (INDEPENDENT_AMBULATORY_CARE_PROVIDER_SITE_OTHER): Payer: Medicare Other | Admitting: Vascular Surgery

## 2021-01-16 ENCOUNTER — Encounter (INDEPENDENT_AMBULATORY_CARE_PROVIDER_SITE_OTHER): Payer: Medicare Other

## 2021-01-26 ENCOUNTER — Ambulatory Visit (INDEPENDENT_AMBULATORY_CARE_PROVIDER_SITE_OTHER): Payer: Medicare Other | Admitting: Dermatology

## 2021-01-26 ENCOUNTER — Other Ambulatory Visit: Payer: Self-pay

## 2021-01-26 DIAGNOSIS — Z872 Personal history of diseases of the skin and subcutaneous tissue: Secondary | ICD-10-CM

## 2021-01-26 DIAGNOSIS — L299 Pruritus, unspecified: Secondary | ICD-10-CM

## 2021-01-26 DIAGNOSIS — Z85828 Personal history of other malignant neoplasm of skin: Secondary | ICD-10-CM | POA: Diagnosis not present

## 2021-01-26 DIAGNOSIS — I701 Atherosclerosis of renal artery: Secondary | ICD-10-CM | POA: Diagnosis not present

## 2021-01-26 DIAGNOSIS — L821 Other seborrheic keratosis: Secondary | ICD-10-CM | POA: Diagnosis not present

## 2021-01-26 NOTE — Progress Notes (Signed)
   Follow-Up Visit   Subjective  Kathryn Wise is a 87 y.o. female who presents for the following: Seborrheic Keratosis (6 months f/u legs treated with LN2 with a good response ). 6 months f/u hx of SCC left distal medial popliteal.   The following portions of the chart were reviewed this encounter and updated as appropriate:   Tobacco  Allergies  Meds  Problems  Med Hx  Surg Hx  Fam Hx      Review of Systems:  No other skin or systemic complaints except as noted in HPI or Assessment and Plan.  Objective  Well appearing patient in no apparent distress; mood and affect are within normal limits.  A focused examination was performed including face, legs,back. Relevant physical exam findings are noted in the Assessment and Plan.  left distal medial popliteal Well healed scar with no evidence of recurrence, no lymphadenopathy.   right mid lateral pretibial Well healed scar with no evidence of recurrence.   Mid Back No rash    Assessment & Plan  History of SCC (squamous cell carcinoma) of skin left distal medial popliteal  Clear. Observe for recurrence. Call clinic for new or changing lesions.  Recommend regular skin exams, daily broad-spectrum spf 30+ sunscreen use, and photoprotection.     History of actinic keratosis right mid lateral pretibial  Clear. Observe for recurrence. Call clinic for new or changing lesions.  Recommend regular skin exams, daily broad-spectrum spf 30+ sunscreen use, and photoprotection.     Pruritus Mid Back  Chronic and persistent   Start over the counter Sarna lotion apply to itchy skin daily  Seborrheic Keratoses Legs, back  - Stuck-on, waxy, tan-brown papules and/or plaques  - Benign-appearing - Discussed benign etiology and prognosis. - Observe - Call for any changes  Return in about 1 year (around 01/26/2022) for TBSE, Hx of SCC, hx of AKs, hx of ISK .  I, Marye Round, CMA, am acting as scribe for Sarina Ser, MD .   Documentation: I have reviewed the above documentation for accuracy and completeness, and I agree with the above.  Sarina Ser, MD

## 2021-01-26 NOTE — Patient Instructions (Signed)
Recommend over the counter Sarna lotion for itchy skin    If you have any questions or concerns for your doctor, please call our main line at [phone] and press option 4 to reach your doctor's medical assistant. If no one answers, please leave a voicemail as directed and we will return your call as soon as possible. Messages left after 4 pm will be answered the following business day.   You may also send [place] a message via [place]. We typically respond to MyChart messages within 1-2 business days.  For prescription refills, please ask your pharmacy to contact our office. Our fax number is [phone].  If you have an urgent issue when the clinic is closed that cannot wait until the next business day, you can page your doctor at the number below.    Please note that while we do our best to be available for urgent issues outside of office hours, we are not available 24/7.   If you have an urgent issue and are unable to reach [place], you may choose to seek medical care at your doctor's office, retail clinic, urgent care center, or emergency room.  If you have a medical emergency, please immediately call 911 or go to the emergency department.  Pager Numbers  - Dr. Nehemiah Massed: [phone]  - Dr. Laurence Ferrari: [phone]  - Dr. Nicole Kindred: [phone]  In the event of inclement weather, please call our main line at [phone] for an update on the status of any delays or closures.  Dermatology Medication Tips: Please keep the boxes that topical medications come in in order to help keep track of the instructions about where and how to use these. Pharmacies typically print the medication instructions only on the boxes and not directly on the medication tubes.   If your medication is too expensive, please contact our office at [phone] option 4 or send [place] a message through [place].   We are unable to tell what your co-pay for medications will be in advance as this is different depending on your  insurance coverage. However, we may be able to find a substitute medication at lower cost or fill out paperwork to get insurance to cover a needed medication.   If a prior authorization is required to get your medication covered by your insurance company, please allow [place] 1-2 business days to complete this process.  Drug prices often vary depending on where the prescription is filled and some pharmacies may offer cheaper prices.  The website www.goodrx.com contains coupons for medications through different pharmacies. The prices here do not account for what the cost may be with help from insurance (it may be cheaper with your insurance), but the website can give you the price if you did not use any insurance.  - You can print the associated coupon and take it with your prescription to the pharmacy.  - You may also stop by our office during regular business hours and pick up a GoodRx coupon card.  - If you need your prescription sent electronically to a different pharmacy, notify our office through [facility] or by phone at [phone] option 4.

## 2021-02-04 ENCOUNTER — Encounter: Payer: Self-pay | Admitting: Dermatology

## 2021-02-08 ENCOUNTER — Ambulatory Visit: Payer: Medicare Other | Admitting: Dermatology

## 2021-02-10 ENCOUNTER — Other Ambulatory Visit: Payer: Self-pay

## 2021-02-10 NOTE — Telephone Encounter (Signed)
Temazepam is the medication

## 2021-02-10 NOTE — Telephone Encounter (Signed)
Refilled: 01/06/2021 Last OV: 12/19/2020 Next OV: 03/27/2021

## 2021-02-13 MED ORDER — TEMAZEPAM 15 MG PO CAPS: ORAL_CAPSULE | ORAL | 5 refills | Status: DC

## 2021-02-13 NOTE — Telephone Encounter (Signed)
LMTCB

## 2021-02-13 NOTE — Telephone Encounter (Signed)
Pt called about temazepam and states that she has not been able to sleep in two nights because she has been without her medication.

## 2021-02-14 NOTE — Telephone Encounter (Signed)
Pt called into the office after hours to request a refill on Temazepam.

## 2021-02-14 NOTE — Telephone Encounter (Signed)
Called and spoke to [place] and she states that she was notified last night through text and picked up her medication this morning.

## 2021-02-27 DIAGNOSIS — H35353 Cystoid macular degeneration, bilateral: Secondary | ICD-10-CM | POA: Diagnosis not present

## 2021-03-27 ENCOUNTER — Other Ambulatory Visit: Payer: Self-pay

## 2021-03-27 ENCOUNTER — Encounter: Payer: Self-pay | Admitting: Internal Medicine

## 2021-03-27 ENCOUNTER — Ambulatory Visit (INDEPENDENT_AMBULATORY_CARE_PROVIDER_SITE_OTHER): Payer: Medicare Other | Admitting: Internal Medicine

## 2021-03-27 VITALS — BP 144/76 | HR 75 | Temp 96.5°F | Ht 65.0 in | Wt 154.2 lb

## 2021-03-27 DIAGNOSIS — R609 Edema, unspecified: Secondary | ICD-10-CM | POA: Diagnosis not present

## 2021-03-27 DIAGNOSIS — I700 Atherosclerosis of aorta: Secondary | ICD-10-CM | POA: Diagnosis not present

## 2021-03-27 DIAGNOSIS — T23309A Burn of third degree of unspecified hand, unspecified site, initial encounter: Secondary | ICD-10-CM | POA: Insufficient documentation

## 2021-03-27 DIAGNOSIS — N1831 Chronic kidney disease, stage 3a: Secondary | ICD-10-CM

## 2021-03-27 DIAGNOSIS — E871 Hypo-osmolality and hyponatremia: Secondary | ICD-10-CM

## 2021-03-27 DIAGNOSIS — I701 Atherosclerosis of renal artery: Secondary | ICD-10-CM | POA: Diagnosis not present

## 2021-03-27 DIAGNOSIS — J432 Centrilobular emphysema: Secondary | ICD-10-CM

## 2021-03-27 DIAGNOSIS — T23351A Burn of third degree of right palm, initial encounter: Secondary | ICD-10-CM | POA: Diagnosis not present

## 2021-03-27 DIAGNOSIS — R011 Cardiac murmur, unspecified: Secondary | ICD-10-CM | POA: Diagnosis not present

## 2021-03-27 MED ORDER — SILVER SULFADIAZINE 1 % EX CREA: 1.0000 "application " | TOPICAL_CREAM | Freq: Every day | CUTANEOUS | 1 refills | Status: DC
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
## 2021-03-27 NOTE — Patient Instructions (Addendum)
I will continue to prescribe the temazepam if you want to continue paying for it out of pocket  I am rechecking your sodium level today to make sure it has not dropped
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
## 2021-03-28 ENCOUNTER — Other Ambulatory Visit (INDEPENDENT_AMBULATORY_CARE_PROVIDER_SITE_OTHER): Payer: Medicare Other

## 2021-03-28 DIAGNOSIS — I700 Atherosclerosis of aorta: Secondary | ICD-10-CM | POA: Insufficient documentation

## 2021-03-28 DIAGNOSIS — E871 Hypo-osmolality and hyponatremia: Secondary | ICD-10-CM | POA: Diagnosis not present

## 2021-03-28 DIAGNOSIS — R944 Abnormal results of kidney function studies: Secondary | ICD-10-CM

## 2021-03-28 NOTE — Assessment & Plan Note (Signed)
Managed with low dose lasix per patient preference

## 2021-03-28 NOTE — Assessment & Plan Note (Signed)
Reviewed findings on CT.  She defers treatment at this time as she did not tolerate Xopenex nebulizers.  She maintains that her dyspnea is due to deconditioning from her recent pneumonia and [facility].

## 2021-03-28 NOTE — Assessment & Plan Note (Signed)
Reviewed findings of prior CT scan today..  Patient is tolerating high potency statin therapy  

## 2021-03-28 NOTE — Assessment & Plan Note (Signed)
Moderate mitral regurgitation and mild aortic stenosis by 2018 ECHO with normal EF.  She remains asymptomatic

## 2021-03-29 LAB — BASIC METABOLIC PANEL
BUN: 17 mg/dL (ref 6–23)
CO2: 24 mEq/L (ref 19–32)
Calcium: 9.1 mg/dL (ref 8.4–10.5)
Chloride: 101 mEq/L (ref 96–112)
Creatinine, Ser: 1 mg/dL (ref 0.40–1.20)
GFR: 50.14 mL/min — ABNORMAL LOW (ref >60.00)
Glucose, Bld: 116 mg/dL — ABNORMAL HIGH (ref 70–99)
Potassium: 4.3 mEq/L (ref 3.5–5.1)
Sodium: 134 mEq/L — ABNORMAL LOW (ref 135–145)

## 2021-03-29 LAB — OSMOLALITY, URINE: Osmolality, Ur: 332 mOsm/kg (ref 50–1200)

## 2021-03-30 LAB — OSMOLALITY: Osmolality: 288 mOsm/kg (ref 278–305)

## 2021-03-30 NOTE — Addendum Note (Signed)
Addended by: [name] on: 03/30/2021 04:57 PM   Modules accepted: Orders

## 2021-04-01 ENCOUNTER — Other Ambulatory Visit: Payer: Self-pay | Admitting: Internal Medicine

## 2021-04-01 DIAGNOSIS — R944 Abnormal results of kidney function studies: Secondary | ICD-10-CM | POA: Insufficient documentation

## 2021-04-01 NOTE — Assessment & Plan Note (Addendum)
Mild and stable  serium osmolality is normal.  She has no history of cirrhosis, CHF, or   Diuretic use .  Lab Results  Component Value Date   NA 134 (L) 03/28/2021   K 4.3 03/28/2021   CL 101 03/28/2021   CO2 24 03/28/2021

## 2021-04-01 NOTE — Assessment & Plan Note (Signed)
Present for the past 4 months.  Advised to suspend any OTC NSAIds and repeat renal panel in 2 + weeks

## 2021-04-04 ENCOUNTER — Telehealth: Payer: Medicare Other | Admitting: Family Medicine

## 2021-04-04 ENCOUNTER — Other Ambulatory Visit: Payer: Self-pay

## 2021-04-04 DIAGNOSIS — J209 Acute bronchitis, unspecified: Secondary | ICD-10-CM | POA: Diagnosis not present

## 2021-04-04 DIAGNOSIS — J019 Acute sinusitis, unspecified: Secondary | ICD-10-CM | POA: Diagnosis not present

## 2021-04-04 DIAGNOSIS — Z03818 Encounter for observation for suspected exposure to other biological agents ruled out: Secondary | ICD-10-CM | POA: Diagnosis not present

## 2021-04-17 ENCOUNTER — Other Ambulatory Visit: Payer: Self-pay | Admitting: Internal Medicine

## 2021-04-21 ENCOUNTER — Ambulatory Visit: Payer: Medicare Other

## 2021-04-26 ENCOUNTER — Other Ambulatory Visit (INDEPENDENT_AMBULATORY_CARE_PROVIDER_SITE_OTHER): Payer: Medicare Other

## 2021-04-26 ENCOUNTER — Other Ambulatory Visit: Payer: Self-pay

## 2021-04-26 ENCOUNTER — Ambulatory Visit (INDEPENDENT_AMBULATORY_CARE_PROVIDER_SITE_OTHER): Payer: Medicare Other

## 2021-04-26 DIAGNOSIS — N1831 Chronic kidney disease, stage 3a: Secondary | ICD-10-CM | POA: Diagnosis not present

## 2021-04-26 DIAGNOSIS — Z23 Encounter for immunization: Secondary | ICD-10-CM

## 2021-04-26 DIAGNOSIS — R944 Abnormal results of kidney function studies: Secondary | ICD-10-CM | POA: Diagnosis not present

## 2021-04-26 LAB — BASIC METABOLIC PANEL
BUN: 14 mg/dL (ref 6–23)
CO2: 28 mEq/L (ref 19–32)
Calcium: 9.2 mg/dL (ref 8.4–10.5)
Chloride: 104 mEq/L (ref 96–112)
Creatinine, Ser: 0.86 mg/dL (ref 0.40–1.20)
GFR: 60.05 mL/min (ref >60.00)
Glucose, Bld: 102 mg/dL — ABNORMAL HIGH (ref 70–99)
Potassium: 4.2 mEq/L (ref 3.5–5.1)
Sodium: 138 mEq/L (ref 135–145)

## 2021-04-26 LAB — RENAL FUNCTION PANEL
Albumin: 3.9 g/dL (ref 3.5–5.2)
BUN: 14 mg/dL (ref 6–23)
CO2: 28 mEq/L (ref 19–32)
Calcium: 9.2 mg/dL (ref 8.4–10.5)
Chloride: 104 mEq/L (ref 96–112)
Creatinine, Ser: 0.86 mg/dL (ref 0.40–1.20)
GFR: 60.05 mL/min (ref >60.00)
Glucose, Bld: 102 mg/dL — ABNORMAL HIGH (ref 70–99)
Phosphorus: 4.2 mg/dL (ref 2.3–4.6)
Potassium: 4.2 mEq/L (ref 3.5–5.1)
Sodium: 138 mEq/L (ref 135–145)

## 2021-05-18 ENCOUNTER — Other Ambulatory Visit (INDEPENDENT_AMBULATORY_CARE_PROVIDER_SITE_OTHER): Payer: Self-pay | Admitting: Vascular Surgery

## 2021-05-18 DIAGNOSIS — I701 Atherosclerosis of renal artery: Secondary | ICD-10-CM

## 2021-05-21 NOTE — Progress Notes (Signed)
MRN : [id number]  Kathryn Wise is a 87 y.o. (November 24, 1929) female who presents with chief complaint of follow up for carotid artery.  History of Present Illness:   The patient is seen for follow up evaluation of carotid stenosis status post left carotid stent on 10/19/2020.  There were no post operative problems or complications related to the surgery.  The patient denies groin pain.   Procedure: Placement of a 7 x 9 x 40 exact stent with the use of the NAV-6 embolic protection device in the left internal carotid artery   The patient denies interval amaurosis fugax. There is no recent history of TIA symptoms or focal motor deficits. There is no prior documented CVA.   The patient denies headache.   The patient is taking enteric-coated aspirin 81 mg daily.   The patient has a history of coronary artery disease, no recent episodes of angina or shortness of breath. The patient denies PAD or claudication symptoms. There is a history of hyperlipidemia which is being treated with a statin.   Carotid Duplex RICA 8-67% and LICA (stent) 67-20%  Renal duplex <50% stenosis bilaterally  No outpatient medications have been marked as taking for the 05/22/21 encounter (Appointment) with Delana Meyer, Dolores Lory, MD.    Past Medical History:  Diagnosis Date   Arthritis    Collagen vascular disease ([place])    Dysphagia, pharyngoesophageal phase 09/10/2013   Upper GI study with barium swallow was done at  [facility] Mar 2015   No reflux seen  Small irreducible hiatal hernia  Mild changes of presbyeophagus (abnormal contractions of the esophagus that occur with aging) No strictures Normal gastric emptying  Incomplete visualization of stomach fold due to patient's inability turn     GERD (gastroesophageal reflux disease)    Heart murmur    has had years and years   History of shingles Dec 2013   treated with steroids , post op from shoulder surgery   History of squamous cell carcinoma 05/02/2016   right mid  lateral pretibial   Hypertension    Hypothyroidism    Major depressive disorder, single episode 11/05/2015   Osteoarthritis of left shoulder 07/14/2012   Pneumonia 10/21/2020   Squamous cell carcinoma of skin 05/12/2016   R mid lat pretibial - other skin cancers treated by Dr. Sharlett Iles   Squamous cell carcinoma of skin 08/22/2020   left distal medial popliteal - [facility]    Past Surgical History:  Procedure Laterality Date   ARTHOSCOPIC ROTAOR CUFF REPAIR     LEFT  10+  YEARS     CAROTID PTA/STENT INTERVENTION Left 10/19/2020   Procedure: CAROTID PTA/STENT INTERVENTION;  Surgeon: Katha Cabal, MD;  Location: [facility] [facility];  Service: Cardiovascular;  Laterality: Left;   CATARACT EXTRACTION W/ INTRAOCULAR LENS IMPLANT     RIGHT EYE   CATARACT EXTRACTION W/PHACO Left 03/28/2016   Procedure: CATARACT EXTRACTION PHACO AND INTRAOCULAR LENS PLACEMENT (IOC);  Surgeon: Leandrew Koyanagi, MD;  Location: [facility];  Service: Ophthalmology;  Laterality: Left;  TORIC   FACELIFT     FOOT SURGERY     rt foot   TUMOR REMOVED   JOINT REPLACEMENT Left Dec 2013   shoulder   LEFT HEART CATH AND CORONARY ANGIOGRAPHY N/A 06/12/2017   Procedure: LEFT HEART CATH AND CORONARY ANGIOGRAPHY;  Surgeon: Minna Merritts, MD;  Location: [facility];  Service: Cardiovascular;  Laterality: N/A;   RENAL ANGIOGRAPHY Left 01/07/2019   Procedure: RENAL  ANGIOGRAPHY;  Surgeon: Katha Cabal, MD;  Location: [place] [facility];  Service: Cardiovascular;  Laterality: Left;   RENAL ARTERY STENT     SKIN CANCER EXCISION     TOTAL SHOULDER ARTHROPLASTY  07/14/2012   Procedure: TOTAL SHOULDER ARTHROPLASTY;  Surgeon: Johnny Bridge, MD;  Location: [facility];  Service: Orthopedics;  Laterality: Left;    Social History Social History   Tobacco Use   Smoking status: Former    Types: Cigarettes    Quit date: 07/30/1972    Years since quitting: 48.8   Smokeless tobacco: Never   Tobacco comments:     smoked for only a few months  Vaping Use   Vaping Use: Never used  Substance Use Topics   Alcohol use: Yes    Comment: OCCAS WINE    Drug use: No    Family History Family History  Problem Relation Age of Onset   Cancer Daughter        Breast   Breast cancer Daughter 65   Cancer Son     Allergies  Allergen Reactions   Alendronate Swelling    And nausea     Lisinopril     Spike in BP   Metoprolol Other (See Comments)    Lethargy - PT CURRENTLY TAKING    Oxycodone     Other reaction(s): Other (See Comments) Altered mental status   Oxycontin [Oxycodone Hcl]     Altered mental status     REVIEW OF SYSTEMS (Negative unless checked)  Constitutional: [] Weight loss  [] Fever  [] Chills Cardiac: [] Chest pain   [] Chest pressure   [] Palpitations   [] Shortness of breath when laying flat   [] Shortness of breath with exertion. Vascular:  [] Pain in legs with walking   [] Pain in legs at rest  [] History of DVT   [] Phlebitis   [] Swelling in legs   [] Varicose veins   [] Non-healing ulcers Pulmonary:   [] Uses home oxygen   [] Productive cough   [] Hemoptysis   [] Wheeze  [] COPD   [] Asthma Neurologic:  [] Dizziness   [] Seizures   [] History of stroke   [] History of TIA  [] Aphasia   [] Vissual changes   [] Weakness or numbness in arm   [] Weakness or numbness in leg Musculoskeletal:   [] Joint swelling   [] Joint pain   [] Low back pain Hematologic:  [] Easy bruising  [] Easy bleeding   [] Hypercoagulable state   [] Anemic Gastrointestinal:  [] Diarrhea   [] Vomiting  [] Gastroesophageal reflux/heartburn   [] Difficulty swallowing. Genitourinary:  [] Chronic kidney disease   [] Difficult urination  [] Frequent urination   [] Blood in urine Skin:  [] Rashes   [] Ulcers  Psychological:  [] History of anxiety   []  History of major depression.  Physical Examination  There were no vitals filed for this visit. There is no height or weight on file to calculate BMI. Gen: WD/WN, NAD Head: [place]/AT, No temporalis wasting.   Ear/Nose/Throat: Hearing grossly intact, nares w/o erythema or drainage Eyes: PER, EOMI, sclera nonicteric.  Neck: Supple, no masses.  No bruit or JVD.  Pulmonary:  Good air movement, no audible wheezing, no use of accessory muscles.  Cardiac: RRR, normal S1, S2, no Murmurs. Vascular:  carotid bruit left side Vessel Right Left  Radial Palpable Palpable  Carotid Palpable Palpable  PT Not Palpable Not Palpable  DP Not Palpable Not Palpable  Gastrointestinal: soft, non-distended. No guarding/no peritoneal signs.  Musculoskeletal: M/S 5/5 throughout.  No visible deformity.  Neurologic: CN 2-12 intact. Pain and light touch intact in extremities.  Symmetrical.  Speech is fluent. Motor exam as listed above. Psychiatric: Judgment intact, Mood & affect appropriate for pt's clinical situation. Dermatologic: No rashes or ulcers noted.  No changes consistent with cellulitis.   CBC Lab Results  Component Value Date   WBC 8.4 10/24/2020   HGB 9.2 (L) 10/24/2020   HCT 27.8 (L) 10/24/2020   MCV 85.3 10/24/2020   PLT 170 10/24/2020    BMET    Component Value Date/Time   NA 138 04/26/2021 0913   NA 138 04/26/2021 0913   NA 138 06/07/2014 0000   K 4.2 04/26/2021 0913   K 4.2 04/26/2021 0913   CL 104 04/26/2021 0913   CL 104 04/26/2021 0913   CO2 28 04/26/2021 0913   CO2 28 04/26/2021 0913   GLUCOSE 102 (H) 04/26/2021 0913   GLUCOSE 102 (H) 04/26/2021 0913   BUN 14 04/26/2021 0913   BUN 14 04/26/2021 0913   BUN 20 06/07/2014 0000   CREATININE 0.86 04/26/2021 0913   CREATININE 0.86 04/26/2021 0913   CREATININE 1.14 (H) 12/03/2018 1417   CALCIUM 9.2 04/26/2021 0913   CALCIUM 9.2 04/26/2021 0913   GFRNONAA >60 10/24/2020 0540   GFRAA >60 08/14/2019 1536   CrCl cannot be calculated (Patient's most recent lab result is older than the maximum 21 days allowed.).  COAG Lab Results  Component Value Date   INR 1.1 10/21/2020   INR 1.0 08/14/2019   INR 1.0 11/08/2018     Radiology No results found.   Assessment/Plan 1. Carotid stenosis, symptomatic w/o infarct, bilateral Recommend:  The patient is s/p successful left stent  Duplex ultrasound preoperatively shows 1-39% RICA and 03-70% LICA.  Continue antiplatelet therapy as prescribed Continue management of CAD, HTN and Hyperlipidemia Healthy heart diet,  encouraged exercise at least 4 times per week  Follow up in 12 months with duplex ultrasound and physical exam based on the patient's carotid stent and <50% stenosis of the right carotid artery    2. Renal artery stenosis (HCC) Given patient's arterial disease optimal control of the patient's hypertension is important.  BP is acceptable today  The patient's vital signs and noninvasive studies support the renal artery stenosis is not significantly increased when compared to the previous study.  No invasive studies or intervention is indicated at this time.  The patient will continue the current antihypertensive medications, no changes at this time.  The primary medical service will continue aggressive antihypertensive therapy as per the AHA guidelines    3. PAD (peripheral artery disease) (HCC)  Recommend:  The patient has evidence of atherosclerosis of the lower extremities with claudication.  The patient does not voice lifestyle limiting changes at this point in time.  Noninvasive studies do not suggest clinically significant change.  No invasive studies, angiography or surgery at this time The patient should continue walking and begin a more formal exercise program.  The patient should continue antiplatelet therapy and aggressive treatment of the lipid abnormalities  No changes in the patient's medications at this time  The patient should continue wearing graduated compression socks 10-15 mmHg strength to control the mild edema.    4. Essential hypertension Continue antihypertensive medications as already ordered, these  medications have been reviewed and there are no changes at this time.   5. Centrilobular emphysema ([place]) Continue pulmonary medications and aerosols as already ordered, these medications have been reviewed and there are no changes at this time.    6. Hyperlipidemia LDL goal <130 Continue statin as  ordered and reviewed, no changes at this time     [name], MD  05/21/2021 11:16 AM

## 2021-05-22 ENCOUNTER — Other Ambulatory Visit: Payer: Self-pay

## 2021-05-22 ENCOUNTER — Encounter (INDEPENDENT_AMBULATORY_CARE_PROVIDER_SITE_OTHER): Payer: Self-pay | Admitting: Vascular Surgery

## 2021-05-22 ENCOUNTER — Ambulatory Visit (INDEPENDENT_AMBULATORY_CARE_PROVIDER_SITE_OTHER): Payer: Medicare Other | Admitting: Vascular Surgery

## 2021-05-22 ENCOUNTER — Ambulatory Visit (INDEPENDENT_AMBULATORY_CARE_PROVIDER_SITE_OTHER): Payer: Medicare Other

## 2021-05-22 VITALS — BP 140/75 | HR 69 | Ht 65.0 in | Wt 151.0 lb

## 2021-05-22 DIAGNOSIS — I739 Peripheral vascular disease, unspecified: Secondary | ICD-10-CM | POA: Diagnosis not present

## 2021-05-22 DIAGNOSIS — E785 Hyperlipidemia, unspecified: Secondary | ICD-10-CM

## 2021-05-22 DIAGNOSIS — J432 Centrilobular emphysema: Secondary | ICD-10-CM

## 2021-05-22 DIAGNOSIS — I701 Atherosclerosis of renal artery: Secondary | ICD-10-CM

## 2021-05-22 DIAGNOSIS — I10 Essential (primary) hypertension: Secondary | ICD-10-CM | POA: Diagnosis not present

## 2021-05-22 DIAGNOSIS — I6523 Occlusion and stenosis of bilateral carotid arteries: Secondary | ICD-10-CM

## 2021-05-28 ENCOUNTER — Encounter (INDEPENDENT_AMBULATORY_CARE_PROVIDER_SITE_OTHER): Payer: Self-pay | Admitting: Vascular Surgery

## 2021-05-30 DIAGNOSIS — Z20828 Contact with and (suspected) exposure to other viral communicable diseases: Secondary | ICD-10-CM | POA: Diagnosis not present

## 2021-07-03 DIAGNOSIS — Z20822 Contact with and (suspected) exposure to covid-19: Secondary | ICD-10-CM | POA: Diagnosis not present

## 2021-08-08 DIAGNOSIS — Z20822 Contact with and (suspected) exposure to covid-19: Secondary | ICD-10-CM | POA: Diagnosis not present

## 2021-08-21 ENCOUNTER — Other Ambulatory Visit: Payer: Self-pay | Admitting: Internal Medicine

## 2021-08-21 DIAGNOSIS — K219 Gastro-esophageal reflux disease without esophagitis: Secondary | ICD-10-CM

## 2021-08-21 DIAGNOSIS — F5102 Adjustment insomnia: Secondary | ICD-10-CM

## 2021-08-21 MED ORDER — OMEPRAZOLE 20 MG PO CPDR: 20.0000 mg | DELAYED_RELEASE_CAPSULE | Freq: Every day | ORAL | 2 refills | Status: DC | PRN

## 2021-08-21 MED ORDER — TEMAZEPAM 15 MG PO CAPS: ORAL_CAPSULE | ORAL | 5 refills | Status: DC

## 2021-08-21 NOTE — Telephone Encounter (Signed)
Pt called in stating she needs refill on omeprazole (PRILOSEC) 20 MG capsule and temazepam (RESTORIL) 15 MG capsule sent to [facility] in [place]. Pt has changed pharmacies

## 2021-09-04 DIAGNOSIS — H04123 Dry eye syndrome of bilateral lacrimal glands: Secondary | ICD-10-CM | POA: Diagnosis not present

## 2021-09-09 DIAGNOSIS — Z20822 Contact with and (suspected) exposure to covid-19: Secondary | ICD-10-CM | POA: Diagnosis not present

## 2021-09-11 ENCOUNTER — Telehealth: Payer: Self-pay | Admitting: Internal Medicine

## 2021-09-11 MED ORDER — LEVOTHYROXINE SODIUM 88 MCG PO TABS: 88.0000 ug | ORAL_TABLET | Freq: Every day | ORAL | 1 refills | Status: DC

## 2021-09-11 NOTE — Addendum Note (Signed)
Addended by: Adair Laundry on: 09/11/2021 12:39 PM   Modules accepted: Orders

## 2021-09-11 NOTE — Telephone Encounter (Signed)
Patient is now using [facility] in [place], please send all refills to [facility], she could not tell front office which ones needs refilled.

## 2021-09-11 NOTE — Telephone Encounter (Signed)
Medications have been refilled ?

## 2021-09-12 ENCOUNTER — Other Ambulatory Visit: Payer: Self-pay | Admitting: Internal Medicine

## 2021-09-12 ENCOUNTER — Other Ambulatory Visit: Payer: Self-pay

## 2021-09-12 DIAGNOSIS — E785 Hyperlipidemia, unspecified: Secondary | ICD-10-CM

## 2021-09-12 DIAGNOSIS — R944 Abnormal results of kidney function studies: Secondary | ICD-10-CM

## 2021-09-12 DIAGNOSIS — I672 Cerebral atherosclerosis: Secondary | ICD-10-CM

## 2021-09-12 DIAGNOSIS — Z20822 Contact with and (suspected) exposure to covid-19: Secondary | ICD-10-CM | POA: Diagnosis not present

## 2021-09-12 NOTE — Telephone Encounter (Signed)
Medication not prescribed by PCP, originally prescribed by [name]. Pt stated that she will not be going back to see [name] because she did not "like him". Pt is wanting to know if we can take over filling this rx.

## 2021-09-13 ENCOUNTER — Telehealth: Payer: Self-pay

## 2021-09-13 MED ORDER — ROSUVASTATIN CALCIUM 10 MG PO TABS: 10.0000 mg | ORAL_TABLET | Freq: Every day | ORAL | 1 refills | Status: DC

## 2021-09-15 NOTE — Telephone Encounter (Signed)
Lm for pt

## 2021-09-26 ENCOUNTER — Encounter: Payer: Self-pay | Admitting: Internal Medicine

## 2021-09-26 ENCOUNTER — Other Ambulatory Visit: Payer: Self-pay

## 2021-09-26 ENCOUNTER — Ambulatory Visit (INDEPENDENT_AMBULATORY_CARE_PROVIDER_SITE_OTHER): Payer: Medicare Other | Admitting: Internal Medicine

## 2021-09-26 VITALS — BP 150/72 | HR 83 | Temp 98.0°F | Ht 65.0 in | Wt 155.0 lb

## 2021-09-26 DIAGNOSIS — R5383 Other fatigue: Secondary | ICD-10-CM

## 2021-09-26 DIAGNOSIS — I700 Atherosclerosis of aorta: Secondary | ICD-10-CM | POA: Diagnosis not present

## 2021-09-26 DIAGNOSIS — I150 Renovascular hypertension: Secondary | ICD-10-CM

## 2021-09-26 DIAGNOSIS — I10 Essential (primary) hypertension: Secondary | ICD-10-CM

## 2021-09-26 DIAGNOSIS — E119 Type 2 diabetes mellitus without complications: Secondary | ICD-10-CM

## 2021-09-26 DIAGNOSIS — E034 Atrophy of thyroid (acquired): Secondary | ICD-10-CM

## 2021-09-26 DIAGNOSIS — H905 Unspecified sensorineural hearing loss: Secondary | ICD-10-CM

## 2021-09-26 DIAGNOSIS — E785 Hyperlipidemia, unspecified: Secondary | ICD-10-CM

## 2021-09-26 DIAGNOSIS — R519 Headache, unspecified: Secondary | ICD-10-CM | POA: Diagnosis not present

## 2021-09-26 DIAGNOSIS — M2550 Pain in unspecified joint: Secondary | ICD-10-CM | POA: Diagnosis not present

## 2021-09-26 DIAGNOSIS — R7301 Impaired fasting glucose: Secondary | ICD-10-CM | POA: Diagnosis not present

## 2021-09-26 MED ORDER — PREDNISONE 10 MG PO TABS: ORAL_TABLET | ORAL | 0 refills | Status: DC

## 2021-09-26 NOTE — Assessment & Plan Note (Addendum)
She continues to report that she has PMR despite remote rheumatology opinion to the contrary.  She has had repeatedly normal ESR 's   and again normal today. Given her insistence and the relative benign nature of the treatment she is requesting (prednisone taper) I will prescribe a 6 day taper   Lab Results  Component Value Date   ESRSEDRATE 12 09/26/2021   Lab Results  Component Value Date   CRP <1.0 09/26/2021

## 2021-09-26 NOTE — Assessment & Plan Note (Signed)
She underwent MRI with 8th Cranial nerve protocol in Oct 2020 for further evaluation:   No evidence of cerebellopontine angle mass or internalauditory canal lesion. There are four cranial nerves within the internal auditory canals. Normal morphology of the inner ear structures. No abnormal enhancement. No restricted diffusion.

## 2021-09-26 NOTE — Progress Notes (Addendum)
Subjective:  Patient ID: Kathryn Wise, female    DOB: 10-Sep-1929  Age: 88 y.o. MRN: [id number]  CC: The primary encounter diagnosis was Essential hypertension. Diagnoses of Hypothyroidism due to acquired atrophy of thyroid, Hyperlipidemia LDL goal <130, Other fatigue, Impaired fasting glucose, Renovascular hypertension, Aortic atherosclerosis ([place]), Recurrent occipital headache, Sensorineural hearing loss (SNHL) of left ear, unspecified hearing status on contralateral side, Polyarthralgia, and Diet-controlled diabetes mellitus ([place]) were also pertinent to this visit.   This visit occurred during the SARS-CoV-2 public health emergency.  Safety protocols were in place, including screening questions prior to the visit, additional usage of staff PPE, and extensive cleaning of exam room while observing appropriate contact time as indicated for disinfecting solutions.    HPI Kathryn Wise presents for  6 month follow up on chroic issues. Chief Complaint  Patient presents with   Follow-up    6 month follow up    1) "I feel terrible."   "my poly is acting up."  For the past 2.5 weeks.  wants to  resume prednisone .  'it's in my head now"  . Very argumentative today.  States that the pain started in the top of her head, affected her thinking.  Now at the occiput . And behind the left ear.    The  soreness travels all over my body , from head to toe,  , caused her hearing loss, which she still blames on lack of prednisone.  "Started in my eyes"  saw [facility], has referred her for eyelid surgery.  Went to dentist for a teeth cleaning  . Teeth are now sore all the time.  Left side of jaw is painful and grinds.  Has seen rheumatologist in the distant past.  Doesn't want to see one.    Denies being in pain.  "Just sore"  not taking anything , just wants prednisone . For the last 2 days has been unable to swallow food ,  states that it is taking her a long time to get something down  but is not choking   2)  HTN:  states that  the carotid artery procedure done 2 years ago cured her.  Home readings have been 130/80.     Outpatient Medications Prior to Visit  Medication Sig Dispense Refill   aspirin EC 81 MG tablet Take 1 tablet (81 mg total) by mouth daily. Swallow whole. 90 tablet 3   b complex vitamins capsule Take 1 capsule by mouth daily.     Cholecalciferol (VITAMIN D-3) 1000 units CAPS Take 1,000 Units by mouth daily.     Cyanocobalamin (VITAMIN B 12 PO) Take 1,000 mcg by mouth daily.     ketoconazole (NIZORAL) 2 % cream Apply to the feet and between the toes every night. (Patient taking differently: Apply 1 application topically at bedtime. Apply to the feet and between the toes every night.) 60 g 3   levothyroxine (SYNTHROID) 88 MCG tablet Take 1 tablet (88 mcg total) by mouth daily before breakfast. 90 tablet 1   nitroGLYCERIN (NITROSTAT) 0.4 MG SL tablet Place 1 tablet (0.4 mg total) every 5 (five) minutes as needed under the tongue for chest pain. 30 tablet 2   omeprazole (PRILOSEC) 20 MG capsule Take 1 capsule (20 mg total) by mouth daily as needed (heartburn). TAKE 1 CAPSULE BY MOUTH ONCE EVERY MORNING AS NEEDED 90 capsule 2   potassium chloride SA (KLOR-CON) 20 MEQ tablet Take 1 tablet (20 mEq total) by mouth  daily. 30 tablet 1   RESTASIS 0.05 % ophthalmic emulsion Place 1 drop into both eyes 2 (two) times daily.      rosuvastatin (CRESTOR) 10 MG tablet Take 1 tablet (10 mg total) by mouth daily. 90 tablet 1   temazepam (RESTORIL) 15 MG capsule TAKE 1 CAPSULE BY MOUTH AT BEDTIME AS NEEDED SLEEP 30 capsule 5   Turmeric 500 MG CAPS Take 500 mg by mouth daily.     clopidogrel (PLAVIX) 75 MG tablet Take 1 tablet (75 mg total) by mouth daily. (Patient not taking: Reported on 09/26/2021) 90 tablet 3   furosemide (LASIX) 20 MG tablet Take 1 tablet (20 mg total) by mouth daily. (Patient not taking: Reported on 09/26/2021) 90 tablet 1   levalbuterol (XOPENEX) 0.63 MG/3ML nebulizer solution Take 3  mLs (0.63 mg total) by nebulization every 6 (six) hours as needed for wheezing or shortness of breath. (Patient not taking: Reported on 09/26/2021) 75 mL 0   silver sulfADIAZINE (SILVADENE) 1 % cream Apply 1 application topically daily. Apply to right palm (Patient not taking: Reported on 09/26/2021) 400 g 1   SYSTANE PRESERVATIVE FREE 0.4-0.3 % SOLN Place 1 drop into both eyes 4 (four) times daily as needed (dry eyes). (Patient not taking: Reported on 09/26/2021)     No facility-administered medications prior to visit.    Review of Systems;  Patient denies headache, fevers, malaise, unintentional weight loss, skin rash, eye pain, sinus congestion and sinus pain, sore throat, dysphagia,  hemoptysis , cough, dyspnea, wheezing, chest pain, palpitations, orthopnea, edema, abdominal pain, nausea, melena, diarrhea, constipation, flank pain, dysuria, hematuria, urinary  Frequency, nocturia, numbness, tingling, seizures,  Focal weakness, Loss of consciousness,  Tremor, insomnia, depression, anxiety, and suicidal ideation.      Objective:  BP (!) 150/72 (BP Location: Left Arm, Patient Position: Sitting, Cuff Size: Normal)    Pulse 83    Temp 98 F (36.7 C) (Oral)    Ht 5' 5" (1.651 m)    Wt 155 lb (70.3 kg)    SpO2 98%    BMI 25.79 kg/m   BP Readings from Last 3 Encounters:  09/26/21 (!) 150/72  05/22/21 140/75  03/27/21 (!) 144/76    Wt Readings from Last 3 Encounters:  09/26/21 155 lb (70.3 kg)  05/22/21 151 lb (68.5 kg)  03/27/21 154 lb 3.2 oz (69.9 kg)    General appearance: alert, cooperative and appears stated age Ears: normal TM's and external ear canals both ears Throat: lips, mucosa, and tongue normal; teeth and gums normal Neck: no adenopathy, no carotid bruit, supple, symmetrical, trachea midline and thyroid not enlarged, symmetric, no tenderness/mass/nodules Back: symmetric, no curvature. ROM normal. No CVA tenderness. Lungs: clear to auscultation bilaterally Heart: regular rate  and rhythm, S1, S2 normal, no murmur, click, rub or gallop Abdomen: soft, non-tender; bowel sounds normal; no masses,  no organomegaly Pulses: 2+ and symmetric Skin: Skin color, texture, turgor normal. No rashes or lesions Lymph nodes: Cervical, supraclavicular, and axillary nodes normal.  Lab Results  Component Value Date   HGBA1C 6.4 09/26/2021   HGBA1C 5.7 (A) 09/21/2020   HGBA1C 5.9 11/03/2019    Lab Results  Component Value Date   CREATININE 0.98 09/26/2021   CREATININE 0.86 04/26/2021   CREATININE 0.86 04/26/2021    Lab Results  Component Value Date   WBC 9.1 09/26/2021   HGB 10.8 (L) 09/26/2021   HCT 35.2 (L) 09/26/2021   PLT 187.0 09/26/2021   GLUCOSE 167 (H) 09/26/2021  CHOL 134 09/26/2021   TRIG 130.0 09/26/2021   HDL 52.10 09/26/2021   LDLDIRECT 126.0 09/19/2016   LDLCALC 56 09/26/2021   ALT 11 09/26/2021   AST 20 09/26/2021   NA 134 (L) 09/26/2021   K 4.3 09/26/2021   CL 100 09/26/2021   CREATININE 0.98 09/26/2021   BUN 18 09/26/2021   CO2 26 09/26/2021   TSH 1.49 09/26/2021   INR 1.1 10/21/2020   HGBA1C 6.4 09/26/2021    CT Chest Wo Contrast  Result Date: 12/14/2020 CLINICAL DATA:  Right lower lobe cavitary nodule. History of postoperative pneumonia. EXAM: CT CHEST WITHOUT CONTRAST TECHNIQUE: Multidetector CT imaging of the chest was performed following the standard protocol without IV contrast. COMPARISON:  10/21/2020 and 06/10/2017. FINDINGS: Cardiovascular: Atherosclerotic calcification of the aorta, aortic valve and coronary arteries. Pulmonic trunk is enlarged. Heart is at the upper limits of normal in size to mildly enlarged. Mediastinum/Nodes: No pathologically enlarged mediastinal or axillary lymph nodes. Hilar regions are difficult to definitively evaluate without IV contrast. Esophagus is grossly unremarkable. Small to moderate hiatal hernia. Lungs/Pleura: Biapical pleuroparenchymal scarring. Centrilobular emphysema. Millimetric right apical  pulmonary nodule (3/25), unchanged from 06/10/2017 and benign. Scattered foci of mucoid impaction. Interval clearing of patchy pulmonary parenchymal ground-glass and nodular consolidation previously seen on 10/21/2020. No residual pleural fluid. Airway is unremarkable. Upper Abdomen: Visualized portions of the liver, gallbladder, adrenal glands, kidneys, spleen, pancreas, stomach and bowel are unremarkable with exception of a small to moderate hiatal hernia. Right renal artery stent. Left renal artery ostial calcification. Musculoskeletal: Degenerative changes in the spine. Left shoulder arthroplasty. IMPRESSION: 1. Interval clearing of previously seen multi lobar pneumonia and bilateral pleural effusions. No residual suspicious pulmonary nodules. 2. Small to moderate hiatal hernia. 3. Aortic atherosclerosis (ICD10-I70.0). Coronary artery calcification. 4. Enlarged pulmonic trunk, indicative of pulmonary arterial hypertension. 5.  Emphysema (ICD10-J43.9). Electronically Signed   By: Lorin Picket M.D.   On: 12/14/2020 08:44    Assessment & Plan:   Problem List Items Addressed This Visit     Aortic atherosclerosis ([place])    Untreated due to statin intolerance      Diet-controlled diabetes mellitus ([place])    She has been asked to return in one month to discuss  Lab Results  Component Value Date   HGBA1C 6.4 09/26/2021         RESOLVED: Essential hypertension - Primary   Relevant Orders   Urine Microalbumin w/creat. ratio   Hyperlipidemia LDL goal <130   Relevant Orders   Lipid Profile (Completed)   Hypothyroidism   Relevant Orders   TSH (Completed)   Polyarthralgia    She continues to report that she has PMR despite remote rheumatology opinion to the contrary.  She has had repeatedly normal ESR 's   and again normal today. Given her insistence and the relative benign nature of the treatment she is requesting (prednisone taper) I will prescribe a 6 day taper   Lab Results  Component  Value Date   ESRSEDRATE 12 09/26/2021   Lab Results  Component Value Date   CRP <1.0 09/26/2021         Recurrent occipital headache    Checking ESR and CRP today due to patient presenting with occipital pain and diffuse  Body pain that she reports is a flare of her polymyalgia       Relevant Orders   Sedimentation rate (Completed)   C-reactive protein (Completed)   CBC with Differential/Platelet (Completed)   Renovascular hypertension  Currently taking nothing for BP due to multiple intolerances  AND reports of home readings consistently < 130/80      Relevant Orders   Comprehensive metabolic panel (Completed)   Sensorineural hearing loss (SNHL) of left ear    She underwent MRI with 8th Cranial nerve protocol in Oct 2020 for further evaluation:   No evidence of cerebellopontine angle mass or internal auditory canal lesion.  There are four cranial nerves within the internal auditory canals. Normal morphology of the inner ear structures. No abnormal enhancement. No restricted diffusion.             Other Visit Diagnoses     Other fatigue       Impaired fasting glucose       Relevant Orders   HgB A1c (Completed)       I spent 30 minutes dedicated to the care of this patient on the date of this encounter to include pre-visit review of patient's medical history,  most recent imaging studies, Face-to-face time with the patient , and post visit ordering of testing and therapeutics.    Follow-up: Return in about 2 weeks (around 10/10/2021).   [name], MD

## 2021-09-26 NOTE — Assessment & Plan Note (Signed)
Untreated due to statin intolerance  

## 2021-09-26 NOTE — Assessment & Plan Note (Signed)
Checking ESR and CRP today due to patient presenting with occipital pain and diffuse  Body pain that she reports is a flare of her polymyalgia

## 2021-09-26 NOTE — Patient Instructions (Signed)
I have sent a prescription for prednisone to your pharmacy  to start while we wait for the blood work  Please return in 2 weeks

## 2021-09-26 NOTE — Assessment & Plan Note (Addendum)
Currently taking nothing for BP due to multiple intolerances  AND reports of home readings consistently < 130/80

## 2021-09-26 NOTE — Addendum Note (Signed)
Addended by: Leeanne Rio on: 09/26/2021 02:45 PM   Modules accepted: Orders

## 2021-09-27 DIAGNOSIS — H02831 Dermatochalasis of right upper eyelid: Secondary | ICD-10-CM | POA: Diagnosis not present

## 2021-09-27 DIAGNOSIS — E119 Type 2 diabetes mellitus without complications: Secondary | ICD-10-CM | POA: Insufficient documentation

## 2021-09-27 DIAGNOSIS — H02834 Dermatochalasis of left upper eyelid: Secondary | ICD-10-CM | POA: Diagnosis not present

## 2021-09-27 LAB — LIPID PANEL
Cholesterol: 134 mg/dL (ref 0–200)
HDL: 52.1 mg/dL (ref >39.00)
LDL Cholesterol: 56 mg/dL (ref 0–99)
NonHDL: 81.69
Total CHOL/HDL Ratio: 3
Triglycerides: 130 mg/dL (ref 0.0–149.0)
VLDL: 26 mg/dL (ref 0.0–40.0)

## 2021-09-27 LAB — TSH: TSH: 1.49 u[IU]/mL (ref 0.35–5.50)

## 2021-09-27 LAB — CBC WITH DIFFERENTIAL/PLATELET
Basophils Absolute: 0.1 10*3/uL (ref 0.0–0.1)
Basophils Relative: 1.1 % (ref 0.0–3.0)
Eosinophils Absolute: 0.1 10*3/uL (ref 0.0–0.7)
Eosinophils Relative: 0.7 % (ref 0.0–5.0)
HCT: 35.2 % — ABNORMAL LOW (ref 36.0–46.0)
Hemoglobin: 10.8 g/dL — ABNORMAL LOW (ref 12.0–15.0)
Lymphocytes Relative: 27.7 % (ref 12.0–46.0)
Lymphs Abs: 2.5 10*3/uL (ref 0.7–4.0)
MCHC: 30.7 g/dL (ref 30.0–36.0)
MCV: 77.3 fl — ABNORMAL LOW (ref 78.0–100.0)
Monocytes Absolute: 0.9 10*3/uL (ref 0.1–1.0)
Monocytes Relative: 10.2 % (ref 3.0–12.0)
Neutro Abs: 5.5 10*3/uL (ref 1.4–7.7)
Neutrophils Relative %: 60.3 % (ref 43.0–77.0)
Platelets: 187 10*3/uL (ref 150.0–400.0)
RBC: 4.56 Mil/uL (ref 3.87–5.11)
RDW: 18.8 % — ABNORMAL HIGH (ref 11.5–15.5)
WBC: 9.1 10*3/uL (ref 4.0–10.5)

## 2021-09-27 LAB — HEMOGLOBIN A1C: Hgb A1c MFr Bld: 6.4 % (ref 4.6–6.5)

## 2021-09-27 LAB — COMPREHENSIVE METABOLIC PANEL
ALT: 11 U/L (ref 0–35)
AST: 20 U/L (ref 0–37)
Albumin: 4 g/dL (ref 3.5–5.2)
Alkaline Phosphatase: 56 U/L (ref 39–117)
BUN: 18 mg/dL (ref 6–23)
CO2: 26 mEq/L (ref 19–32)
Calcium: 9.1 mg/dL (ref 8.4–10.5)
Chloride: 100 mEq/L (ref 96–112)
Creatinine, Ser: 0.98 mg/dL (ref 0.40–1.20)
GFR: 51.19 mL/min — ABNORMAL LOW (ref >60.00)
Glucose, Bld: 167 mg/dL — ABNORMAL HIGH (ref 70–99)
Potassium: 4.3 mEq/L (ref 3.5–5.1)
Sodium: 134 mEq/L — ABNORMAL LOW (ref 135–145)
Total Bilirubin: 0.8 mg/dL (ref 0.2–1.2)
Total Protein: 6.6 g/dL (ref 6.0–8.3)

## 2021-09-27 LAB — C-REACTIVE PROTEIN: CRP: <1 mg/dL (ref 0.5–20.0)

## 2021-09-27 LAB — SEDIMENTATION RATE: Sed Rate: 12 mm/hr (ref 0–30)

## 2021-09-27 NOTE — Assessment & Plan Note (Signed)
She has been asked to return in one month to discuss ? ?Lab Results  ?Component Value Date  ? HGBA1C 6.4 09/26/2021  ? ? ?

## 2021-09-28 DIAGNOSIS — L574 Cutis laxa senilis: Secondary | ICD-10-CM | POA: Diagnosis not present

## 2021-10-05 ENCOUNTER — Telehealth: Payer: Self-pay | Admitting: Internal Medicine

## 2021-10-05 NOTE — Telephone Encounter (Signed)
LMTCB. Need to find out if pt is taking the furosemide because it is marked in her chart as not taking.  ?

## 2021-10-05 NOTE — Telephone Encounter (Signed)
Pt returning call

## 2021-10-05 NOTE — Telephone Encounter (Signed)
Patient called and would like a refill on her furosemide (LASIX) 20 MG tablet. ?

## 2021-10-06 NOTE — Telephone Encounter (Signed)
Spoke with pt to let her know that she does not need to take the lasix if she is not having any swelling.  ?

## 2021-10-06 NOTE — Telephone Encounter (Signed)
Spoke with pt and she stated that she was wanting to know if she needed to take lasix since she had been on the prednisone recently. Pt has finished prednisone and stated that she is not having any swelling just her normal bloating.  ?

## 2021-10-10 ENCOUNTER — Other Ambulatory Visit: Payer: Self-pay

## 2021-10-10 ENCOUNTER — Ambulatory Visit (INDEPENDENT_AMBULATORY_CARE_PROVIDER_SITE_OTHER): Payer: Medicare Other | Admitting: Internal Medicine

## 2021-10-10 ENCOUNTER — Encounter: Payer: Self-pay | Admitting: Internal Medicine

## 2021-10-10 VITALS — BP 150/70 | HR 86 | Temp 98.2°F | Ht 65.0 in | Wt 155.4 lb

## 2021-10-10 DIAGNOSIS — M2550 Pain in unspecified joint: Secondary | ICD-10-CM

## 2021-10-10 DIAGNOSIS — I150 Renovascular hypertension: Secondary | ICD-10-CM | POA: Diagnosis not present

## 2021-10-10 DIAGNOSIS — B353 Tinea pedis: Secondary | ICD-10-CM | POA: Diagnosis not present

## 2021-10-10 DIAGNOSIS — I10 Essential (primary) hypertension: Secondary | ICD-10-CM

## 2021-10-10 DIAGNOSIS — R7303 Prediabetes: Secondary | ICD-10-CM

## 2021-10-10 DIAGNOSIS — H6120 Impacted cerumen, unspecified ear: Secondary | ICD-10-CM | POA: Insufficient documentation

## 2021-10-10 DIAGNOSIS — D649 Anemia, unspecified: Secondary | ICD-10-CM | POA: Diagnosis not present

## 2021-10-10 DIAGNOSIS — H6122 Impacted cerumen, left ear: Secondary | ICD-10-CM

## 2021-10-10 DIAGNOSIS — I701 Atherosclerosis of renal artery: Secondary | ICD-10-CM | POA: Diagnosis not present

## 2021-10-10 DIAGNOSIS — D509 Iron deficiency anemia, unspecified: Secondary | ICD-10-CM

## 2021-10-10 LAB — B12 AND FOLATE PANEL
Folate: >24.2 ng/mL (ref >5.9)
Vitamin B-12: 1391 pg/mL — ABNORMAL HIGH (ref 211–911)

## 2021-10-10 LAB — IBC + FERRITIN
Ferritin: 11.5 ng/mL (ref 10.0–291.0)
Iron: 27 ug/dL — ABNORMAL LOW (ref 42–145)
Saturation Ratios: 6.4 % — ABNORMAL LOW (ref 20.0–50.0)
TIBC: 421.4 ug/dL (ref 250.0–450.0)
Transferrin: 301 mg/dL (ref 212.0–360.0)

## 2021-10-10 LAB — MICROALBUMIN / CREATININE URINE RATIO
Creatinine,U: 132.9 mg/dL
Microalb Creat Ratio: 7.9 mg/g (ref 0.0–30.0)
Microalb, Ur: 10.4 mg/dL — ABNORMAL HIGH (ref 0.0–1.9)

## 2021-10-10 MED ORDER — KETOCONAZOLE 2 % EX CREA: 1.0000 "application " | TOPICAL_CREAM | Freq: Every day | CUTANEOUS | 2 refills | Status: DC

## 2021-10-10 NOTE — Assessment & Plan Note (Signed)
She has a history of multiple drug intolerances and reports normal readings for age based on home measurements  ?

## 2021-10-10 NOTE — Assessment & Plan Note (Signed)
a1c is now 6.4  She is exercising regularly.  encouarge to continue doing so and WATCH her starch intake  ?

## 2021-10-10 NOTE — Patient Instructions (Addendum)
You can try using Debrox in your left ear to soften the ear wax  ? ?Your a1c of 6.4 means that you are very close to developing type 2 Diabetes! ? ?Watch your sugar intake including starchy foods.  ? ? ?Let me know if want an ENT referral    I think very highly of Kathryn Wise in [place]  ? ?Your anemia is improving but not resolved.  I am checking your iron and b12 levels today to see if they need supplementing  ?

## 2021-10-10 NOTE — Progress Notes (Signed)
? ?Subjective:  ?Patient ID: Kathryn Wise, female    DOB: September 04, 1929  Age: 88 y.o. MRN: [id number] ? ?CC: The primary encounter diagnosis was Anemia, unspecified type. Diagnoses of Impacted cerumen of left ear, Tinea pedis of both feet, Renovascular hypertension, Renal artery stenosis ([facility]), Polyarthralgia, and Prediabetes were also pertinent to this visit. ? ? ?This visit occurred during the SARS-CoV-2 public health emergency.  Safety protocols were in place, including screening questions prior to the visit, additional usage of staff PPE, and extensive cleaning of exam room while observing appropriate contact time as indicated for disinfecting solutions.   ? ?HPI ?Kathryn Wise presents for  ?Chief Complaint  ?Patient presents with  ? Follow-up  ?  2 week follow up on Polyarthralgia  ? ?Patient was treated empirically with prednisone taper of one week,  when she presented  2 weeks ago for  a presumed flare up of PMR .   Inflammatory markers were normal (ESR/CRP) at time of visit.  She states that the symptoms had been present for 1-2 weeks prior to presentation and have all improved with the exception of jaw pain .  Symptoms of diffuse joint pain have improved and  she reports feeling back to her old self again .  Headache improved but her  left TMJ still painful  with popping reported when she opens and closes her jaw.  Has seen her dentist and x rays given . She continues to report feeling "out if it"  at last visit,  that she has been having trouble finishing conversations, but this has resolved.   ? ?Prediabetes:  a1c done last month was 6.4,  random glucose was 167.  Does not have a sweet tooth.  Exercising using swimming  ? ?HTN:  Hypertension: patient checks blood pressure twice weekly at home.  Readings have been for the most part < 140/80 at rest . Patient is following a reduce salt diet most days and is taking medications as prescribed . Reports anxiety driving prior  to coming to office,  anxiety about  her health  ? ?Left ear occlusion (partial)  by ear wax.   ? ?Outpatient Medications Prior to Visit  ?Medication Sig Dispense Refill  ? aspirin EC 81 MG tablet Take 1 tablet (81 mg total) by mouth daily. Swallow whole. 90 tablet 3  ? b complex vitamins capsule Take 1 capsule by mouth daily.    ? Cholecalciferol (VITAMIN D-3) 1000 units CAPS Take 1,000 Units by mouth daily.    ? Cyanocobalamin (VITAMIN B 12 PO) Take 1,000 mcg by mouth daily.    ? levothyroxine (SYNTHROID) 88 MCG tablet Take 1 tablet (88 mcg total) by mouth daily before breakfast. 90 tablet 1  ? nitroGLYCERIN (NITROSTAT) 0.4 MG SL tablet Place 1 tablet (0.4 mg total) every 5 (five) minutes as needed under the tongue for chest pain. 30 tablet 2  ? omeprazole (PRILOSEC) 20 MG capsule Take 1 capsule (20 mg total) by mouth daily as needed (heartburn). TAKE 1 CAPSULE BY MOUTH ONCE EVERY MORNING AS NEEDED 90 capsule 2  ? potassium chloride SA (KLOR-CON) 20 MEQ tablet Take 1 tablet (20 mEq total) by mouth daily. 30 tablet 1  ? predniSONE (DELTASONE) 10 MG tablet 6 tablets on Day 1 , then reduce by 1 tablet daily until gone 21 tablet 0  ? RESTASIS 0.05 % ophthalmic emulsion Place 1 drop into both eyes 2 (two) times daily.     ? rosuvastatin (CRESTOR) 10 MG tablet Take  1 tablet (10 mg total) by mouth daily. 90 tablet 1  ? temazepam (RESTORIL) 15 MG capsule TAKE 1 CAPSULE BY MOUTH AT BEDTIME AS NEEDED SLEEP 30 capsule 5  ? Turmeric 500 MG CAPS Take 500 mg by mouth daily.    ? ketoconazole (NIZORAL) 2 % cream Apply to the feet and between the toes every night. (Patient taking differently: Apply 1 application. topically at bedtime. Apply to the feet and between the toes every night.) 60 g 3  ? furosemide (LASIX) 20 MG tablet Take 1 tablet (20 mg total) by mouth daily. (Patient not taking: Reported on 09/26/2021) 90 tablet 1  ? clopidogrel (PLAVIX) 75 MG tablet Take 1 tablet (75 mg total) by mouth daily. (Patient not taking: Reported on 09/26/2021) 90 tablet 3   ? ?No facility-administered medications prior to visit.  ? ? ?Review of Systems; ? ?Patient denies headache, fevers, malaise, unintentional weight loss, skin rash, eye pain, sinus congestion and sinus pain, sore throat, dysphagia,  hemoptysis , cough, dyspnea, wheezing, chest pain, palpitations, orthopnea, edema, abdominal pain, nausea, melena, diarrhea, constipation, flank pain, dysuria, hematuria, urinary  Frequency, nocturia, numbness, tingling, seizures,  Focal weakness, Loss of consciousness,  Tremor, insomnia, depression, anxiety, and suicidal ideation.   ? ? ? ?Objective:  ?BP (!) 150/70 (BP Location: Left Arm, Patient Position: Sitting, Cuff Size: Normal)   Pulse 86   Temp 98.2 ?F (36.8 ?C) (Oral)   Ht 5' 5" (1.651 m)   Wt 155 lb 6.4 oz (70.5 kg)   SpO2 97%   BMI 25.86 kg/m?  ? ?BP Readings from Last 3 Encounters:  ?10/10/21 (!) 150/70  ?09/26/21 (!) 150/72  ?05/22/21 140/75  ? ? ?Wt Readings from Last 3 Encounters:  ?10/10/21 155 lb 6.4 oz (70.5 kg)  ?09/26/21 155 lb (70.3 kg)  ?05/22/21 151 lb (68.5 kg)  ? ? ?General appearance: alert, cooperative and appears stated age ?Ears: normal TM's and external ear canals both ears ?Throat: lips, mucosa, and tongue normal; teeth and gums normal ?Neck: no adenopathy, no carotid bruit, supple, symmetrical, trachea midline and thyroid not enlarged, symmetric, no tenderness/mass/nodules ?Back: symmetric, no curvature. ROM normal. No CVA tenderness. ?Lungs: clear to auscultation bilaterally ?Heart: regular rate and rhythm, S1, S2 normal, no murmur, click, rub or gallop ?Abdomen: soft, non-tender; bowel sounds normal; no masses,  no organomegaly ?Pulses: 2+ and symmetric ?Skin: Skin color, texture, turgor normal. No rashes or lesions ?Lymph nodes: Cervical, supraclavicular, and axillary nodes normal. ? ?Lab Results  ?Component Value Date  ? HGBA1C 6.4 09/26/2021  ? HGBA1C 5.7 (A) 09/21/2020  ? HGBA1C 5.9 11/03/2019  ? ? ?Lab Results  ?Component Value Date  ?  CREATININE 0.98 09/26/2021  ? CREATININE 0.86 04/26/2021  ? CREATININE 0.86 04/26/2021  ? ? ?Lab Results  ?Component Value Date  ? WBC 9.1 09/26/2021  ? HGB 10.8 (L) 09/26/2021  ? HCT 35.2 (L) 09/26/2021  ? PLT 187.0 09/26/2021  ? GLUCOSE 167 (H) 09/26/2021  ? CHOL 134 09/26/2021  ? TRIG 130.0 09/26/2021  ? HDL 52.10 09/26/2021  ? LDLDIRECT 126.0 09/19/2016  ? [place] 56 09/26/2021  ? ALT 11 09/26/2021  ? AST 20 09/26/2021  ? NA 134 (L) 09/26/2021  ? K 4.3 09/26/2021  ? CL 100 09/26/2021  ? CREATININE 0.98 09/26/2021  ? BUN 18 09/26/2021  ? CO2 26 09/26/2021  ? TSH 1.49 09/26/2021  ? INR 1.1 10/21/2020  ? HGBA1C 6.4 09/26/2021  ? ? ?CT Chest Wo Contrast ? ?Result  Date: 12/14/2020 ?CLINICAL DATA:  Right lower lobe cavitary nodule. History of postoperative pneumonia. EXAM: CT CHEST WITHOUT CONTRAST TECHNIQUE: Multidetector CT imaging of the chest was performed following the standard protocol without IV contrast. COMPARISON:  10/21/2020 and 06/10/2017. FINDINGS: Cardiovascular: Atherosclerotic calcification of the aorta, aortic valve and coronary arteries. Pulmonic trunk is enlarged. Heart is at the upper limits of normal in size to mildly enlarged. Mediastinum/Nodes: No pathologically enlarged mediastinal or axillary lymph nodes. Hilar regions are difficult to definitively evaluate without IV contrast. Esophagus is grossly unremarkable. Small to moderate hiatal hernia. Lungs/Pleura: Biapical pleuroparenchymal scarring. Centrilobular emphysema. Millimetric right apical pulmonary nodule (3/25), unchanged from 06/10/2017 and benign. Scattered foci of mucoid impaction. Interval clearing of patchy pulmonary parenchymal ground-glass and nodular consolidation previously seen on 10/21/2020. No residual pleural fluid. Airway is unremarkable. Upper Abdomen: Visualized portions of the liver, gallbladder, adrenal glands, kidneys, spleen, pancreas, stomach and bowel are unremarkable with exception of a small to moderate hiatal  hernia. Right renal artery stent. Left renal artery ostial calcification. Musculoskeletal: Degenerative changes in the spine. Left shoulder arthroplasty. IMPRESSION: 1. Interval clearing of previously seen multi lobar pn

## 2021-10-10 NOTE — Assessment & Plan Note (Addendum)
Currently taking nothing for BP due to multiple intolerances  AND reports of home readings consistently < 130/80 at home .  No changes made today  ?

## 2021-10-10 NOTE — Assessment & Plan Note (Signed)
Incomplete.  Recommend use of Debrox .  Referral to ENT offered but she is undecided . ?

## 2021-10-10 NOTE — Assessment & Plan Note (Signed)
All symptoms previously reported except for Left TMJ pain have resolved after a 1 week prednisone taper.  Inflammatory markers were normal ? ?Lab Results  ?Component Value Date  ? ESRSEDRATE 12 09/26/2021  ? ?Lab Results  ?Component Value Date  ? CRP <1.0 09/26/2021  ? ? ?

## 2021-10-12 ENCOUNTER — Emergency Department: Payer: Medicare Other

## 2021-10-12 ENCOUNTER — Observation Stay
Admission: EM | Admit: 2021-10-12 | Discharge: 2021-10-13 | Disposition: A | Discharge status: Home / Self Care | Payer: Medicare Other | Attending: Hospitalist | Admitting: Hospitalist

## 2021-10-12 ENCOUNTER — Other Ambulatory Visit: Payer: Self-pay

## 2021-10-12 DIAGNOSIS — I517 Cardiomegaly: Secondary | ICD-10-CM | POA: Diagnosis not present

## 2021-10-12 DIAGNOSIS — E039 Hypothyroidism, unspecified: Secondary | ICD-10-CM | POA: Insufficient documentation

## 2021-10-12 DIAGNOSIS — M2550 Pain in unspecified joint: Secondary | ICD-10-CM | POA: Diagnosis present

## 2021-10-12 DIAGNOSIS — I10 Essential (primary) hypertension: Secondary | ICD-10-CM | POA: Diagnosis not present

## 2021-10-12 DIAGNOSIS — Z96612 Presence of left artificial shoulder joint: Secondary | ICD-10-CM | POA: Diagnosis not present

## 2021-10-12 DIAGNOSIS — Z7982 Long term (current) use of aspirin: Secondary | ICD-10-CM | POA: Diagnosis not present

## 2021-10-12 DIAGNOSIS — R0902 Hypoxemia: Secondary | ICD-10-CM | POA: Diagnosis not present

## 2021-10-12 DIAGNOSIS — D509 Iron deficiency anemia, unspecified: Secondary | ICD-10-CM | POA: Insufficient documentation

## 2021-10-12 DIAGNOSIS — I160 Hypertensive urgency: Principal | ICD-10-CM | POA: Diagnosis present

## 2021-10-12 DIAGNOSIS — R778 Other specified abnormalities of plasma proteins: Secondary | ICD-10-CM | POA: Insufficient documentation

## 2021-10-12 DIAGNOSIS — R519 Headache, unspecified: Secondary | ICD-10-CM | POA: Diagnosis not present

## 2021-10-12 DIAGNOSIS — Z85828 Personal history of other malignant neoplasm of skin: Secondary | ICD-10-CM | POA: Insufficient documentation

## 2021-10-12 DIAGNOSIS — I2510 Atherosclerotic heart disease of native coronary artery without angina pectoris: Secondary | ICD-10-CM | POA: Insufficient documentation

## 2021-10-12 DIAGNOSIS — Z79899 Other long term (current) drug therapy: Secondary | ICD-10-CM | POA: Diagnosis not present

## 2021-10-12 DIAGNOSIS — M26629 Arthralgia of temporomandibular joint, unspecified side: Secondary | ICD-10-CM

## 2021-10-12 DIAGNOSIS — K219 Gastro-esophageal reflux disease without esophagitis: Secondary | ICD-10-CM | POA: Diagnosis present

## 2021-10-12 DIAGNOSIS — Z87891 Personal history of nicotine dependence: Secondary | ICD-10-CM | POA: Insufficient documentation

## 2021-10-12 LAB — CBC
HCT: 36 % (ref 36.0–46.0)
Hemoglobin: 10.9 g/dL — ABNORMAL LOW (ref 12.0–15.0)
MCH: 23.9 pg — ABNORMAL LOW (ref 26.0–34.0)
MCHC: 30.3 g/dL (ref 30.0–36.0)
MCV: 78.9 fL — ABNORMAL LOW (ref 80.0–100.0)
Platelets: 166 10*3/uL (ref 150–400)
RBC: 4.56 MIL/uL (ref 3.87–5.11)
RDW: 18.2 % — ABNORMAL HIGH (ref 11.5–15.5)
WBC: 8.3 10*3/uL (ref 4.0–10.5)
nRBC: 0 % (ref 0.0–0.2)

## 2021-10-12 LAB — COMPREHENSIVE METABOLIC PANEL
ALT: 15 U/L (ref 0–44)
AST: 26 U/L (ref 15–41)
Albumin: 3.6 g/dL (ref 3.5–5.0)
Alkaline Phosphatase: 50 U/L (ref 38–126)
Anion gap: 11 (ref 5–15)
BUN: 15 mg/dL (ref 8–23)
CO2: 23 mmol/L (ref 22–32)
Calcium: 9.2 mg/dL (ref 8.9–10.3)
Chloride: 101 mmol/L (ref 98–111)
Creatinine, Ser: 0.94 mg/dL (ref 0.44–1.00)
GFR, Estimated: 58 mL/min — ABNORMAL LOW (ref >60)
Glucose, Bld: 96 mg/dL (ref 70–99)
Potassium: 4.5 mmol/L (ref 3.5–5.1)
Sodium: 135 mmol/L (ref 135–145)
Total Bilirubin: 0.7 mg/dL (ref 0.3–1.2)
Total Protein: 6.8 g/dL (ref 6.5–8.1)

## 2021-10-12 LAB — TROPONIN I (HIGH SENSITIVITY)
Troponin I (High Sensitivity): 35 ng/L — ABNORMAL HIGH (ref <18)
Troponin I (High Sensitivity): 36 ng/L — ABNORMAL HIGH (ref <18)

## 2021-10-12 LAB — GLUCOSE, CAPILLARY: Glucose-Capillary: 97 mg/dL (ref 70–99)

## 2021-10-12 MED ORDER — SODIUM CHLORIDE 0.9% FLUSH
3.0000 mL | Freq: Two times a day (BID) | INTRAVENOUS | Status: DC
  Administered 2021-10-13 (×2): 3 mL via INTRAVENOUS

## 2021-10-12 MED ORDER — AMLODIPINE BESYLATE 5 MG PO TABS
10.0000 mg | ORAL_TABLET | Freq: Once | ORAL | Status: AC
  Administered 2021-10-12: 10 mg via ORAL
  Filled 2021-10-12: qty 2

## 2021-10-12 MED ORDER — NITROGLYCERIN 0.4 MG SL SUBL: 0.4000 mg | SUBLINGUAL_TABLET | SUBLINGUAL | Status: DC | PRN

## 2021-10-12 MED ORDER — LEVOTHYROXINE SODIUM 88 MCG PO TABS: 88.0000 ug | ORAL_TABLET | Freq: Every day | ORAL | Status: DC

## 2021-10-12 MED ORDER — IRON (FERROUS SULFATE) 325 (65 FE) MG PO TABS: 325.0000 mg | ORAL_TABLET | Freq: Every day | ORAL | 2 refills | Status: DC

## 2021-10-12 MED ORDER — SODIUM CHLORIDE 0.9 % IV SOLN: 250.0000 mL | INTRAVENOUS | Status: DC | PRN

## 2021-10-12 MED ORDER — TEMAZEPAM 7.5 MG PO CAPS
15.0000 mg | ORAL_CAPSULE | Freq: Every evening | ORAL | Status: DC | PRN
  Administered 2021-10-12: 15 mg via ORAL
  Filled 2021-10-12: qty 2

## 2021-10-12 MED ORDER — LABETALOL HCL 5 MG/ML IV SOLN
5.0000 mg | Freq: Once | INTRAVENOUS | Status: AC
  Administered 2021-10-12: 5 mg via INTRAVENOUS
  Filled 2021-10-12: qty 4

## 2021-10-12 MED ORDER — ROSUVASTATIN CALCIUM 10 MG PO TABS
10.0000 mg | ORAL_TABLET | Freq: Every day | ORAL | Status: DC
  Administered 2021-10-12: 10 mg via ORAL
  Filled 2021-10-12 (×2): qty 1

## 2021-10-12 MED ORDER — B COMPLEX-C PO TABS
1.0000 | ORAL_TABLET | Freq: Every day | ORAL | Status: DC
  Administered 2021-10-13: 1 via ORAL
  Filled 2021-10-12: qty 1

## 2021-10-12 MED ORDER — ASPIRIN EC 81 MG PO TBEC: 81.0000 mg | DELAYED_RELEASE_TABLET | Freq: Every day | ORAL | Status: DC

## 2021-10-12 MED ORDER — CYCLOSPORINE 0.05 % OP EMUL
1.0000 [drp] | Freq: Two times a day (BID) | OPHTHALMIC | Status: DC
  Administered 2021-10-13: 1 [drp] via OPHTHALMIC
  Filled 2021-10-12 (×2): qty 30

## 2021-10-12 MED ORDER — IBUPROFEN 400 MG PO TABS
400.0000 mg | ORAL_TABLET | Freq: Once | ORAL | Status: AC
  Administered 2021-10-12: 400 mg via ORAL
  Filled 2021-10-12: qty 1

## 2021-10-12 MED ORDER — LEVOTHYROXINE SODIUM 88 MCG PO TABS
88.0000 ug | ORAL_TABLET | Freq: Every day | ORAL | Status: DC
  Administered 2021-10-12: 88 ug via ORAL
  Filled 2021-10-12: qty 1

## 2021-10-12 MED ORDER — ASPIRIN 81 MG PO CHEW
324.0000 mg | CHEWABLE_TABLET | Freq: Once | ORAL | Status: AC
  Administered 2021-10-12: 324 mg via ORAL
  Filled 2021-10-12: qty 4

## 2021-10-12 MED ORDER — ASPIRIN EC 81 MG PO TBEC
81.0000 mg | DELAYED_RELEASE_TABLET | Freq: Every day | ORAL | Status: DC
  Administered 2021-10-13: 81 mg via ORAL
  Filled 2021-10-12: qty 1

## 2021-10-12 MED ORDER — ONDANSETRON HCL 4 MG PO TABS: 4.0000 mg | ORAL_TABLET | Freq: Four times a day (QID) | ORAL | Status: DC | PRN

## 2021-10-12 MED ORDER — PANTOPRAZOLE SODIUM 40 MG PO TBEC
40.0000 mg | DELAYED_RELEASE_TABLET | Freq: Every day | ORAL | Status: DC
Start: 2021-10-12 — End: 2021-10-13
  Administered 2021-10-12 – 2021-10-13 (×2): 40 mg via ORAL
  Filled 2021-10-12 (×2): qty 1

## 2021-10-12 MED ORDER — LEVOTHYROXINE SODIUM 88 MCG PO TABS
88.0000 ug | ORAL_TABLET | Freq: Every day | ORAL | Status: DC
  Filled 2021-10-12: qty 1

## 2021-10-12 MED ORDER — ACETAMINOPHEN 325 MG PO TABS: 650.0000 mg | ORAL_TABLET | Freq: Four times a day (QID) | ORAL | Status: DC | PRN

## 2021-10-12 MED ORDER — CYANOCOBALAMIN 500 MCG PO TABS
1000.0000 ug | ORAL_TABLET | Freq: Every day | ORAL | Status: DC
  Administered 2021-10-12 – 2021-10-13 (×2): 1000 ug via ORAL
  Filled 2021-10-12 (×5): qty 2

## 2021-10-12 MED ORDER — AMLODIPINE BESYLATE 10 MG PO TABS: 10.0000 mg | ORAL_TABLET | Freq: Every day | ORAL | 1 refills | Status: DC

## 2021-10-12 MED ORDER — ACETAMINOPHEN 650 MG RE SUPP: 650.0000 mg | Freq: Four times a day (QID) | RECTAL | Status: DC | PRN

## 2021-10-12 MED ORDER — ENOXAPARIN SODIUM 40 MG/0.4ML IJ SOSY
40.0000 mg | PREFILLED_SYRINGE | INTRAMUSCULAR | Status: DC
  Administered 2021-10-12: 40 mg via SUBCUTANEOUS
  Filled 2021-10-12: qty 0.4

## 2021-10-12 MED ORDER — ONDANSETRON HCL 4 MG/2ML IJ SOLN: 4.0000 mg | Freq: Four times a day (QID) | INTRAMUSCULAR | Status: DC | PRN

## 2021-10-12 MED ORDER — SODIUM CHLORIDE 0.9% FLUSH: 3.0000 mL | INTRAVENOUS | Status: DC | PRN

## 2021-10-12 MED ORDER — VITAMIN D 25 MCG (1000 UNIT) PO TABS
1000.0000 [IU] | ORAL_TABLET | Freq: Every day | ORAL | Status: DC
  Administered 2021-10-12 – 2021-10-13 (×2): 1000 [IU] via ORAL
  Filled 2021-10-12 (×2): qty 1

## 2021-10-12 NOTE — ED Notes (Signed)
This RN called lab re: receiving blood sent down before orders were placed. They will receive it now. ?

## 2021-10-12 NOTE — Assessment & Plan Note (Signed)
Unclear etiology and may be related to recent steroid administration of her polymyalgia rheumatica ?Patient received 15 mg of amlodipine as well as labetalol 5 mg ?Manual blood pressure check every shift ?We will start her on amlodipine 10 mg daily ?

## 2021-10-12 NOTE — Assessment & Plan Note (Signed)
Advised to start an iron supplement and repeat labs in 4 weeks ?

## 2021-10-12 NOTE — Assessment & Plan Note (Signed)
Will give 1 dose of ibuprofen 400 mg with meals to assess response ?

## 2021-10-12 NOTE — Addendum Note (Signed)
Addended by: [name] on: 10/12/2021 09:19 PM ? ? Modules accepted: Orders ? ?

## 2021-10-12 NOTE — Assessment & Plan Note (Signed)
Most likely secondary to demand ischemia from elevated blood pressure ?Patient denies having any chest pain  ?Obtain 2D echocardiogram to assess LVEF and rule out regional wall motion abnormality ? ?

## 2021-10-12 NOTE — Assessment & Plan Note (Signed)
Improved

## 2021-10-12 NOTE — Progress Notes (Deleted)
Received  MD order to discharge patient to home.  Attempted to review discharge instructions, follow up appointments and home meds with patient but patient declined and said she would read over them herself.   ?

## 2021-10-12 NOTE — ED Triage Notes (Signed)
Pt BIB EMS from home for elevated BP. Pt was noted to have A. Fib on the monitor with EMS, no known hx of. Pt did take amlodipine she has leftover at home, but is no longer taking, as her PCP took her off. ?

## 2021-10-12 NOTE — Assessment & Plan Note (Signed)
Stable.  Continue PPI. 

## 2021-10-12 NOTE — Assessment & Plan Note (Signed)
Patient has a known history of coronary artery disease ?Continue aspirin 81 mg daily and statins ?

## 2021-10-12 NOTE — H&P (Signed)
?History and Physical  ? ? ?Patient: Kathryn Wise VPX:[id number] DOB: [birth year] ?DOA: 10/12/2021 ?DOS: the patient was seen and examined on 10/12/2021 ?PCP: [name], MD  ?Patient coming from: Home ? ?Chief Complaint:  ?Chief Complaint  ?Patient presents with  ? Hypertension  ? ?HPI: Kathryn Wise is a 88 y.o. female with medical history significant for polymyalgia rheumatica, GERD, renal artery stenosis status post stent angioplasty, carotid artery stenosis status post stent angioplasty hypothyroidism who presents to the ER for evaluation of high blood pressure.  ?Patient states that she used to be on antihypertensive medications but that was discontinued after she had stent angioplasty for renal artery stenosis.  She checks her blood pressure daily and usually gets a systolic of about 462 mmHg. she was seen by her primary care provider 2 weeks ago and during that visit had a blood pressure of 172mHg.  On the day of admission she checked her blood pressure at home and noted a reading of 180/81.  This was concerning to her and so she went ahead and took one of her old blood pressure medications, amlodipine 5 mg.  She rechecked her blood pressure again and it was still elevated so she came into the ER. ?She complains of blurred vision and pain in her left jaw when she chews which she has had for about 5 days.  She was recently placed on a steroid taper by her primary care provider for her polymyalgia rheumatica with some improvement. ?She denies having any chest pain, no shortness of breath, no dizziness, no lightheadedness, no fever, no focal deficit, no cough, no urinary symptoms, no abdominal pain, no changes in her bowel habits. ?She received a dose of amlodipine 10 mg and labetalol 5 mg IV in the ER as well as aspirin 324.  She will be referred to observation status for further evaluation ?Review of Systems: As mentioned in the history of present illness. All other systems reviewed and are  negative. ?Past Medical History:  ?Diagnosis Date  ? Arthritis   ? Collagen vascular disease [place]   ? Dysphagia, pharyngoesophageal phase 09/10/2013  ? [facility]   No reflux seen  Small irreducible hiatal hernia  Mild changes of presbyeophagus (abnormal contractions of the esophagus that occur with aging) No strictures Normal gastric emptying  Incomplete visualization of stomach fold due to patient's inability turn    ? GERD (gastroesophageal reflux disease)   ? Heart murmur   ? has had years and years  ? History of shingles Dec 2013  ? treated with steroids , post op from shoulder surgery  ? History of squamous cell carcinoma 05/02/2016  ? right mid lateral pretibial  ? Hypertension   ? Hypothyroidism   ? Major depressive disorder, single episode 11/05/2015  ? Osteoarthritis of left shoulder 07/14/2012  ? Pneumonia 10/21/2020  ? Squamous cell carcinoma of skin 05/12/2016  ? R mid lat pretibial - other skin cancers treated by Dr. PSharlett Iles ? Squamous cell carcinoma of skin 08/22/2020  ? left distal medial popliteal - [facility]  ? ?Past Surgical History:  ?Procedure Laterality Date  ? ARTHOSCOPIC ROTAOR CUFF REPAIR    ? LEFT  10+  YEARS    ? CAROTID PTA/STENT INTERVENTION Left 10/19/2020  ? Procedure: CAROTID PTA/STENT INTERVENTION;  Surgeon: SKatha Cabal MD;  [facility];  Service: Cardiovascular;  Laterality: Left;  ? CATARACT EXTRACTION W/ INTRAOCULAR LENS  IMPLANT    ? RIGHT EYE  ? CATARACT EXTRACTION W/PHACO Left 03/28/2016  ? Procedure: CATARACT EXTRACTION PHACO AND INTRAOCULAR LENS PLACEMENT (IOC);  Surgeon: Leandrew Koyanagi, MD;  Location: [facility];  Service: Ophthalmology;  Laterality: Left;  TORIC  ? FACELIFT    ? FOOT SURGERY    ? rt foot   TUMOR REMOVED  ? JOINT REPLACEMENT Left Dec 2013  ? shoulder  ? LEFT HEART CATH AND CORONARY ANGIOGRAPHY N/A 06/12/2017  ? Procedure: LEFT HEART CATH AND CORONARY ANGIOGRAPHY;  Surgeon:  Minna Merritts, MD;  Location: [facility];  Service: Cardiovascular;  Laterality: N/A;  ? RENAL ANGIOGRAPHY Left 01/07/2019  ? Procedure: RENAL ANGIOGRAPHY;  Surgeon: Katha Cabal, MD;  Location: [facility];  Service: Cardiovascular;  Laterality: Left;  ? RENAL ARTERY STENT    ? SKIN CANCER EXCISION    ? TOTAL SHOULDER ARTHROPLASTY  07/14/2012  ? Procedure: TOTAL SHOULDER ARTHROPLASTY;  Surgeon: Johnny Bridge, MD;  Location: [facility];  Service: Orthopedics;  Laterality: Left;  ? ?Social History:  reports that she quit smoking about 49 years ago. Her smoking use included cigarettes. She has never used smokeless tobacco. She reports current alcohol use. She reports that she does not use drugs. ? ?Allergies  ?Allergen Reactions  ? Alendronate Swelling  ?  And nausea  ?  ? Lisinopril   ?  Spike in BP  ? Metoprolol Other (See Comments)  ?  Lethargy - PT CURRENTLY TAKING   ? Oxycodone   ?  Other reaction(s): Other (See Comments) ?Altered mental status  ? Oxycontin [Oxycodone Hcl]   ?  Altered mental status  ? ? ?Family History  ?Problem Relation Age of Onset  ? Cancer Daughter   ?     Breast  ? Breast cancer Daughter 39  ? Cancer Son   ? ? ?Prior to Admission medications   ?Medication Sig Start Date End Date Taking? Authorizing Provider  ?amLODipine (NORVASC) 10 MG tablet Take 1 tablet (10 mg total) by mouth daily. 10/12/21 10/12/22 Yes Merlyn Lot, MD  ?aspirin EC 81 MG tablet Take 1 tablet (81 mg total) by mouth daily. Swallow whole. 10/20/20   Stegmayer, Joelene Millin A, PA-C  ?b complex vitamins capsule Take 1 capsule by mouth daily.    [provider]  ?Cholecalciferol (VITAMIN D-3) 1000 units CAPS Take 1,000 Units by mouth daily.    [provider]  ?Cyanocobalamin (VITAMIN B 12 PO) Take 1,000 mcg by mouth daily.    [provider]  ?furosemide (LASIX) 20 MG tablet Take 1 tablet (20 mg total) by mouth daily. ?Patient not taking: Reported on 09/26/2021 10/24/20    Nicole Kindred A, DO  ?ketoconazole (NIZORAL) 2 % cream Apply 1 application. topically at bedtime. Apply to the feet and between the toes every night. 10/10/21   [name], MD  ?levothyroxine (SYNTHROID) 88 MCG tablet Take 1 tablet (88 mcg total) by mouth daily before breakfast. 09/11/21   [name], MD  ?nitroGLYCERIN (NITROSTAT) 0.4 MG SL tablet Place 1 tablet (0.4 mg total) every 5 (five) minutes as needed under the tongue for chest pain. 06/12/17   Demetrios Loll, MD  ?omeprazole (PRILOSEC) 20 MG capsule Take 1 capsule (20 mg total) by mouth daily as needed (heartburn). TAKE 1 CAPSULE BY MOUTH ONCE EVERY MORNING AS NEEDED 08/21/21   [name], MD  ?potassium chloride SA (KLOR-CON) 20 MEQ tablet Take 1 tablet (20 mEq  total) by mouth daily. 10/24/20   Ezekiel Slocumb, DO  ?predniSONE (DELTASONE) 10 MG tablet 6 tablets on Day 1 , then reduce by 1 tablet daily until gone ?Patient not taking: Reported on 10/12/2021 09/26/21   [name], MD  ?RESTASIS 0.05 % ophthalmic emulsion Place 1 drop into both eyes 2 (two) times daily.  10/22/15   [provider]  ?rosuvastatin (CRESTOR) 10 MG tablet Take 1 tablet (10 mg total) by mouth daily. 09/13/21   [name], MD  ?temazepam (RESTORIL) 15 MG capsule TAKE 1 CAPSULE BY MOUTH AT BEDTIME AS NEEDED SLEEP 08/21/21   [name], MD  ?Turmeric 500 MG CAPS Take 500 mg by mouth daily.    [provider]  ? ? ?Physical Exam: ?Vitals:  ? 10/12/21 1430 10/12/21 1500 10/12/21 1530 10/12/21 1600  ?BP: (!) 168/63 (!) 179/69 (!) 197/58 (!) 182/64  ?Pulse: 63 75 70 68  ?Resp: 14 (!) '21 19 15  '$ ?Temp:      ?TempSrc:      ?SpO2: 100% 99% 97% 96%  ?Weight:      ?Height:      ? ?Physical Exam ?Vitals and nursing note reviewed.  ?Constitutional:   ?   Appearance: Normal appearance. She is normal weight.  ?HENT:  ?   Head: Normocephalic.  ?   Nose: Nose normal.  ?   Mouth/Throat:  ?   Mouth: Mucous membranes are moist.  ?Eyes:  ?   Pupils: Pupils are  equal, round, and reactive to light.  ?Cardiovascular:  ?   Rate and Rhythm: Normal rate and regular rhythm.  ?Pulmonary:  ?   Effort: Pulmonary effort is normal.  ?   Breath sounds: Normal breath sounds.  ?Abdominal:  ?   G

## 2021-10-12 NOTE — ED Provider Notes (Addendum)
? ?[facility] ?Provider Note ? ? ? Event Date/Time  ? First MD Initiated Contact with Patient 10/12/21 1156   ?  (approximate) ? ? ?History  ? ?Hypertension ? ? ?HPI ? ?Kathryn Wise is a 88 y.o. female   with a history of hypertension presents to the ER for evaluation of elevated blood pressure today.  States she checked her blood pressure routinely this morning it was elevated greater than 180.  States that her blood pressure never runs that high.  She has been having intermittent posterior headaches and left jaw pain for the past week.  States that she has told her primary care doctor about this but nothing is been done.  She denies any numbness or tingling.  Denies any chest pain or pressure.  No nausea or vomiting.  Did take 5 mg amlodipine before arrival. ? ?  ? ? ?Physical Exam  ? ?Triage Vital Signs: ?ED Triage Vitals  ?Enc Vitals Group  ?   BP 10/12/21 1200 (!) 218/67  ?   Pulse Rate 10/12/21 1200 76  ?   Resp 10/12/21 1200 14  ?   Temp 10/12/21 1200 97.7 ?F (36.5 ?C)  ?   Temp Source 10/12/21 1200 Oral  ?   SpO2 10/12/21 1158 94 %  ?   Weight 10/12/21 1202 155 lb (70.3 kg)  ?   Height 10/12/21 1202 '5\' 5"'$  (1.651 m)  ?   Head Circumference --   ?   Peak Flow --   ?   Pain Score 10/12/21 1202 0  ?   Pain Loc --   ?   Pain Edu? --   ?   Excl. in [place]? --   ? ? ?Most recent vital signs: ?Vitals:  ? 10/12/21 1530 10/12/21 1600  ?BP: (!) 197/58 (!) 182/64  ?Pulse: 70 68  ?Resp: 19 15  ?Temp:    ?SpO2: 97% 96%  ? ? ? ?Constitutional: Alert  ?Eyes: Conjunctivae are normal.  ?Head: Atraumatic. ?Nose: No congestion/rhinnorhea. ?Mouth/Throat: Mucous membranes are moist.   ?Neck: Painless ROM.  ?Cardiovascular:   Good peripheral circulation. ?Respiratory: Normal respiratory effort.  No retractions.  ?Gastrointestinal: Soft and nontender.  ?Musculoskeletal:  no deformity ?Neurologic:  MAE spontaneously. No gross focal neurologic deficits are appreciated.  ?Skin:  Skin is warm, dry and intact. No  rash noted. ?Psychiatric: Mood and affect are normal. Speech and behavior are normal. ? ? ? ?ED Results / Procedures / Treatments  ? ?Labs ?(all labs ordered are listed, but only abnormal results are displayed) ?Labs Reviewed  ?CBC - Abnormal; Notable for the following components:  ?    Result Value  ? Hemoglobin 10.9 (*)   ? MCV 78.9 (*)   ? MCH 23.9 (*)   ? RDW 18.2 (*)   ? All other components within normal limits  ?COMPREHENSIVE METABOLIC PANEL - Abnormal; Notable for the following components:  ? GFR, Estimated 58 (*)   ? All other components within normal limits  ?TROPONIN I (HIGH SENSITIVITY) - Abnormal; Notable for the following components:  ? Troponin I (High Sensitivity) 35 (*)   ? All other components within normal limits  ?TROPONIN I (HIGH SENSITIVITY) - Abnormal; Notable for the following components:  ? Troponin I (High Sensitivity) 36 (*)   ? All other components within normal limits  ? ? ? ?EKG ? ?ED ECG REPORT ?I, Merlyn Lot, the attending physician, personally viewed and interpreted this ECG. ? ? Date: 10/12/2021 ?  EKG Time: 12:06 ? Rate: 65 ? Rhythm: sinus ? Axis: normal ? Intervals: lbbb ? ST&T Change: nonspecific st abn, no stemi ? ? ? ?RADIOLOGY ?Please see ED Course for my review and interpretation. ? ?I personally reviewed all radiographic images ordered to evaluate for the above acute complaints and reviewed radiology reports and findings.  These findings were personally discussed with the patient.  Please see medical record for radiology report. ? ? ? ?PROCEDURES: ? ?Critical Care performed: No ? ?Procedures ? ? ?MEDICATIONS ORDERED IN ED: ?Medications  ?labetalol (NORMODYNE) injection 5 mg (has no administration in time range)  ?amLODipine (NORVASC) tablet 10 mg (10 mg Oral Given 10/12/21 1313)  ? ? ? ?IMPRESSION / MDM / ASSESSMENT AND PLAN / ED COURSE  ?I reviewed the triage vital signs and the nursing notes. ?             ?               ? ?Differential diagnosis includes, but is not  limited to, htn, dehydration, electroylte abn, cva, mass, tia, chf, acs ? ?Patient presenting with symptoms as described above.  Patient actually asymptomatic than some mild headache for the past week.  Primary concern is elevated blood pressure.  Denies any chest pain or pressure.  Otherwise very well-appearing in no acute distress. ? ?Clinical Course as of 10/12/21 1613  ?Thu Oct 12, 2021  ?1303 My review and interpretation of CT head do not appreciate evidence of mass or bleed. [PR]  ?1514 Patient reassessed.  She remains asymptomatic.  Blood pressure improved to 929 systolic after p.o. amlodipine which has been on the past.  Her troponin is mildly elevated but not rising she adamantly denies any chest pain pressure shortness of breath.  States that she is been having some jaw pain but is from clicking sensation consistent with TMJ in the left side.  Discussed option for hospitalization given her high blood pressure elevated troponin for further medical work-up patient states that she feels fine does not want to be admitted the hospital and prefer outpatient follow-up which given her well appearance I think is reasonable. [PR]  ?  ?Clinical Course User Index ?[PR] Merlyn Lot, MD  ? ?Patient now complaining that her vision is been blurry for the past several days her blood pressure did increase back up to the 200 she does not feel comfortable going home will prefer to be admitted to the hospital therefore will consult hospitalist. ? ? ?FINAL CLINICAL IMPRESSION(S) / ED DIAGNOSES  ? ?Final diagnoses:  ?Hypertension, unspecified type  ? ? ? ?Rx / DC Orders  ? ?ED Discharge Orders   ? ?      Ordered  ?  amLODipine (NORVASC) 10 MG tablet  Daily       ? 10/12/21 1515  ? ?  ?  ? ?  ? ? ? ?Note:  This document was prepared using Dragon voice recognition software and may include unintentional dictation errors. ? ? ?  ?Merlyn Lot, MD ?10/12/21 1613 ? ?

## 2021-10-12 NOTE — ED Notes (Signed)
Pt given gingerale as requested. HOB adjusted for pt. Call bell within reach. Stretcher locked low. Rail up. Visitor remains at bedside.  ?

## 2021-10-12 NOTE — Assessment & Plan Note (Signed)
Stable Continue Synthroid 

## 2021-10-13 DIAGNOSIS — I160 Hypertensive urgency: Secondary | ICD-10-CM | POA: Diagnosis not present

## 2021-10-13 LAB — CBC
HCT: 32.7 % — ABNORMAL LOW (ref 36.0–46.0)
Hemoglobin: 9.9 g/dL — ABNORMAL LOW (ref 12.0–15.0)
MCH: 23.5 pg — ABNORMAL LOW (ref 26.0–34.0)
MCHC: 30.3 g/dL (ref 30.0–36.0)
MCV: 77.7 fL — ABNORMAL LOW (ref 80.0–100.0)
Platelets: 163 10*3/uL (ref 150–400)
RBC: 4.21 MIL/uL (ref 3.87–5.11)
RDW: 18 % — ABNORMAL HIGH (ref 11.5–15.5)
WBC: 7.5 10*3/uL (ref 4.0–10.5)
nRBC: 0 % (ref 0.0–0.2)

## 2021-10-13 LAB — BASIC METABOLIC PANEL
Anion gap: 6 (ref 5–15)
BUN: 14 mg/dL (ref 8–23)
CO2: 25 mmol/L (ref 22–32)
Calcium: 8.9 mg/dL (ref 8.9–10.3)
Chloride: 107 mmol/L (ref 98–111)
Creatinine, Ser: 0.94 mg/dL (ref 0.44–1.00)
GFR, Estimated: 58 mL/min — ABNORMAL LOW (ref >60)
Glucose, Bld: 88 mg/dL (ref 70–99)
Potassium: 4.3 mmol/L (ref 3.5–5.1)
Sodium: 138 mmol/L (ref 135–145)

## 2021-10-13 LAB — GLUCOSE, CAPILLARY: Glucose-Capillary: 98 mg/dL (ref 70–99)

## 2021-10-13 MED ORDER — AMLODIPINE BESYLATE 5 MG PO TABS
5.0000 mg | ORAL_TABLET | Freq: Every day | ORAL | Status: DC
  Administered 2021-10-13: 5 mg via ORAL
  Filled 2021-10-13: qty 1

## 2021-10-13 MED ORDER — AMLODIPINE BESYLATE 10 MG PO TABS: 10.0000 mg | ORAL_TABLET | Freq: Every day | ORAL | 2 refills | Status: DC

## 2021-10-13 NOTE — Discharge Summary (Signed)
? ?Physician Discharge Summary ? ? ?Kathryn Wise  female DOB: 1929-08-10  ?LFY:[id number] ? ?PCP: [name], MD ? ?Admit date: 10/12/2021 ?Discharge date: 10/13/2021 ? ?Admitted From: home ?Disposition:  home ?CODE STATUS: DNR ? ? ?Hospital Course:  ?For full details, please see H&P, progress notes, consult notes and ancillary notes.  ?Briefly,  ?Kathryn Wise is a 88 y.o. female with medical history significant for polymyalgia rheumatica, renal artery stenosis status post stent angioplasty, carotid artery stenosis status post stent angioplasty hypothyroidism who presented to the ER for evaluation of high blood pressure.  ? ?Patient states that she used to be on antihypertensive medications but that was discontinued after she had stent angioplasty for renal artery stenosis.  She checks her blood pressure daily and usually gets a systolic of about 852 mmHg. she was seen by her primary care provider on 10/10/21 and during that visit had a blood pressure of 155mHg.  On the day of admission she checked her blood pressure at home and noted a reading of 180/81.  This was concerning to her and so she went ahead and took one of her old blood pressure medications, amlodipine 5 mg.  She rechecked her blood pressure again and it was still elevated so she came into the ER. ? ?* Hypertensive urgency ?Unclear etiology.  Pt received only amlodipine 10 mg and IV labetalol 5 mg in the ED, and BP was already decreased down to 130's.   ?--pt was discharged on amlodipine 10 mg daily and to follow up with PCP. ?--pt requested to be seen by cardiology.  [facility] ? ?TMJ arthralgia ?S/p 1 dose of ibuprofen 400 mg.  Pt tolerating oral intake. ?  ?Polyarthralgia ?Improved.  Already finished steroid course about 2 weeks ago, per pt. ?  ?Elevated troponin ?Mild, 35 and 36.  Most likely secondary to demand ischemia from elevated blood  pressure. ?Patient denies having any chest pain  ?  ?Coronary artery disease ?Continue aspirin 81 mg daily and statin ?  ?Hypothyroidism ?Continue Synthroid ?  ?GERD (gastroesophageal reflux disease) ?Continue PPI ?  ? ?Discharge Diagnoses:  ?Principal Problem: ?  Hypertensive urgency ?Active Problems: ?  GERD (gastroesophageal reflux disease) ?  Hypothyroidism ?  Coronary artery disease ?  Elevated troponin ?  Polyarthralgia ?  TMJ arthralgia ? ? ? ? ?Discharge Instructions: ? ?Allergies as of 10/13/2021   ? ?   Reactions  ? Alendronate Swelling  ? And nausea   ? Lisinopril   ? Spike in BP  ? Metoprolol Other (See Comments)  ? Lethargy - PT CURRENTLY TAKING   ? Oxycodone   ? Other reaction(s): Other (See Comments) ?Altered mental status  ? Oxycontin [oxycodone Hcl]   ? Altered mental status  ? ?  ? ?  ?Medication List  ?  ? ?STOP taking these medications   ? ?furosemide 20 MG tablet ?Commonly known as: LASIX ?  ?predniSONE 10 MG tablet ?Commonly known as: DELTASONE ?  ?Restasis 0.05 % ophthalmic emulsion ?Generic drug: cycloSPORINE ?  ? ?  ? ?TAKE these medications   ? ?amLODipine 10 MG tablet ?Commonly known as: NORVASC ?Take 1 tablet (10 mg total) by mouth daily. ?  ?aspirin EC 81 MG tablet ?Take 1 tablet (81 mg total) by mouth daily. Swallow whole. ?  ?b complex vitamins capsule ?Take 1 capsule by mouth daily. ?  ?Iron (Ferrous Sulfate) 325 (65  Fe) MG Tabs ?Take 325 mg by mouth daily. ?  ?ketoconazole 2 % cream ?Commonly known as: NIZORAL ?Apply 1 application. topically at bedtime. Apply to the feet and between the toes every night. ?  ?levothyroxine 88 MCG tablet ?Commonly known as: SYNTHROID ?Take 1 tablet (88 mcg total) by mouth daily before breakfast. ?  ?nitroGLYCERIN 0.4 MG SL tablet ?Commonly known as: NITROSTAT ?Place 1 tablet (0.4 mg total) every 5 (five) minutes as needed under the tongue for chest pain. ?  ?omeprazole 20 MG capsule ?Commonly known as: PRILOSEC ?Take 1 capsule (20 mg total) by mouth  daily as needed (heartburn). TAKE 1 CAPSULE BY MOUTH ONCE EVERY MORNING AS NEEDED ?  ?potassium chloride SA 20 MEQ tablet ?Commonly known as: KLOR-CON M ?Take 1 tablet (20 mEq total) by mouth daily. ?  ?rosuvastatin 10 MG tablet ?Commonly known as: CRESTOR ?Take 1 tablet (10 mg total) by mouth daily. ?  ?temazepam 15 MG capsule ?Commonly known as: RESTORIL ?TAKE 1 CAPSULE BY MOUTH AT BEDTIME AS NEEDED SLEEP ?  ?Turmeric 500 MG Caps ?Take 500 mg by mouth daily. ?  ?VITAMIN B 12 PO ?Take 1,000 mcg by mouth daily. ?  ?Vitamin D-3 25 MCG (1000 UT) Caps ?Take 1,000 Units by mouth daily. ?  ? ?  ? ? ? Follow-up Information   ? ? [name], MD Follow up in 1 week(s).   ?Specialty: Internal Medicine ?Contact information: ?[address] ?[address] ?[place] [address] ?[phone] ? ? ?  ?  ? ? [name], MD .   ?Specialty: Internal Medicine ?Contact information: ? [address] ?[address] ?[place] [address] ?[phone] ? ? ?  ?  ? ? Minna Merritts, MD Follow up.   ?Specialty: Cardiology ?Contact information: ?[address] ?[place] [address] ?[phone] ? ? ?  ?  ? ?  ?  ? ?  ? ? ?Allergies  ?Allergen Reactions  ? Alendronate Swelling  ?  And nausea  ?  ? Lisinopril   ?  Spike in BP  ? Metoprolol Other (See Comments)  ?  Lethargy - PT CURRENTLY TAKING   ? Oxycodone   ?  Other reaction(s): Other (See Comments) ?Altered mental status  ? Oxycontin [Oxycodone Hcl]   ?  Altered mental status  ? ? ? ?The results of significant diagnostics from this hospitalization (including imaging, microbiology, ancillary and laboratory) are listed below for reference.  ? ?Consultations: ? ? ?Procedures/Studies: ?CT HEAD WO CONTRAST (5MM) ? ?Result Date: 10/12/2021 ?CLINICAL DATA:  Headache, new or worsening. EXAM: CT HEAD WITHOUT CONTRAST TECHNIQUE: Contiguous axial images were obtained from the base of the skull through the vertex without intravenous contrast. RADIATION DOSE REDUCTION: This exam  was performed according to the departmental dose-optimization program which includes automated exposure control, adjustment of the mA and/or kV according to patient size and/or use of iterative reconstruction technique. COMPARISON:  02/23/2020 FINDINGS: Brain: Stable low-density in the white matter is suggestive for chronic changes. No evidence for acute hemorrhage, mass lesion, midline shift, hydrocephalus or large infarct. Vascular: No hyperdense vessel or unexpected calcification. Skull: Normal. Negative for fracture or focal lesion. Sinuses/Orbits: No acute finding. Other: None. IMPRESSION: 1. No acute intracranial abnormality. 2. Evidence for chronic small vessel ischemic changes. Electronically Signed   By: Markus Daft M.D.   On: 10/12/2021 13:02  ? ?DG Chest Portable 1 View ? ?Result Date: 10/12/2021 ?CLINICAL DATA:  htn eval for cardiomegaly EXAM: PORTABLE  CHEST 1 VIEW COMPARISON:  Radiograph 10/21/2020 FINDINGS: Enlarged cardiac silhouette. There is no focal airspace consolidation. There is no large pleural effusion. There is no visible pneumothorax. There is no acute osseous abnormality. Prior left shoulder arthroplasty. IMPRESSION: Cardiomegaly.  No focal airspace consolidation. Electronically Signed   By: Maurine Simmering M.D.   On: 10/12/2021 12:51   ? ? ? ?Labs: ?BNP (last 3 results) ?Recent Labs  ?  10/21/20 ?1737  ?BNP 966.7*  ? ?Basic Metabolic Panel: ?Recent Labs  ?Lab 10/12/21 ?1204 10/13/21 ?0446  ?NA 135 138  ?K 4.5 4.3  ?CL 101 107  ?CO2 23 25  ?GLUCOSE 96 88  ?BUN 15 14  ?CREATININE 0.94 0.94  ?CALCIUM 9.2 8.9  ? ?Liver Function Tests: ?Recent Labs  ?Lab 10/12/21 ?1204  ?AST 26  ?ALT 15  ?ALKPHOS 50  ?BILITOT 0.7  ?PROT 6.8  ?ALBUMIN 3.6  ? ?No results for input(s): LIPASE, AMYLASE in the last 168 hours. ?No results for input(s): AMMONIA in the last 168 hours. ?CBC: ?Recent Labs  ?Lab 10/12/21 ?1204 10/13/21 ?0446  ?WBC 8.3 7.5  ?HGB 10.9* 9.9*  ?HCT 36.0 32.7*  ?MCV 78.9* 77.7*  ?PLT 166 163   ? ?Cardiac Enzymes: ?No results for input(s): CKTOTAL, CKMB, CKMBINDEX, TROPONINI in the last 168 hours. ?BNP: ?Invalid input(s): POCBNP ?CBG: ?Recent Labs  ?Lab 10/12/21 ?2336 10/13/21 ?0810  ?GLUCAP 97 98  ? ?D-Dimer ?No

## 2021-10-13 NOTE — Progress Notes (Signed)
Received MD orders to discharge patient to home. Reviewed discharge instructions, follow up appointments home meds and prescriptions with patient and patient verbalized understanding.  ?

## 2021-10-13 NOTE — Progress Notes (Signed)
Per telemetry pat had a 5 beat run of V-Tach. NP Sharion Settler notified. No new orders. Continue to monitor patient.  ?

## 2021-10-14 DIAGNOSIS — Z20822 Contact with and (suspected) exposure to covid-19: Secondary | ICD-10-CM | POA: Diagnosis not present

## 2021-10-19 ENCOUNTER — Inpatient Hospital Stay: Payer: Medicare Other | Admitting: Internal Medicine

## 2021-11-09 DIAGNOSIS — Z20822 Contact with and (suspected) exposure to covid-19: Secondary | ICD-10-CM | POA: Diagnosis not present

## 2021-11-13 ENCOUNTER — Emergency Department: Payer: Medicare Other

## 2021-11-13 ENCOUNTER — Encounter: Payer: Self-pay | Admitting: Emergency Medicine

## 2021-11-13 ENCOUNTER — Observation Stay: Payer: Medicare Other

## 2021-11-13 ENCOUNTER — Observation Stay
Admit: 2021-11-13 | Discharge: 2021-11-13 | Disposition: A | Discharge status: Home / Self Care | Payer: Medicare Other | Attending: Internal Medicine | Admitting: Internal Medicine

## 2021-11-13 ENCOUNTER — Other Ambulatory Visit: Payer: Self-pay

## 2021-11-13 ENCOUNTER — Observation Stay
Admission: EM | Admit: 2021-11-13 | Discharge: 2021-11-14 | Disposition: A | Discharge status: Home / Self Care | Payer: Medicare Other | Attending: Internal Medicine | Admitting: Internal Medicine

## 2021-11-13 DIAGNOSIS — Z8673 Personal history of transient ischemic attack (TIA), and cerebral infarction without residual deficits: Secondary | ICD-10-CM | POA: Diagnosis not present

## 2021-11-13 DIAGNOSIS — R600 Localized edema: Secondary | ICD-10-CM | POA: Insufficient documentation

## 2021-11-13 DIAGNOSIS — E039 Hypothyroidism, unspecified: Secondary | ICD-10-CM | POA: Diagnosis not present

## 2021-11-13 DIAGNOSIS — M7989 Other specified soft tissue disorders: Secondary | ICD-10-CM | POA: Diagnosis not present

## 2021-11-13 DIAGNOSIS — R202 Paresthesia of skin: Secondary | ICD-10-CM | POA: Diagnosis not present

## 2021-11-13 DIAGNOSIS — M79604 Pain in right leg: Secondary | ICD-10-CM

## 2021-11-13 DIAGNOSIS — R200 Anesthesia of skin: Secondary | ICD-10-CM

## 2021-11-13 DIAGNOSIS — Z85828 Personal history of other malignant neoplasm of skin: Secondary | ICD-10-CM | POA: Insufficient documentation

## 2021-11-13 DIAGNOSIS — R2243 Localized swelling, mass and lump, lower limb, bilateral: Secondary | ICD-10-CM

## 2021-11-13 DIAGNOSIS — M26629 Arthralgia of temporomandibular joint, unspecified side: Secondary | ICD-10-CM | POA: Insufficient documentation

## 2021-11-13 DIAGNOSIS — Z87891 Personal history of nicotine dependence: Secondary | ICD-10-CM | POA: Diagnosis not present

## 2021-11-13 DIAGNOSIS — Z7982 Long term (current) use of aspirin: Secondary | ICD-10-CM | POA: Diagnosis not present

## 2021-11-13 DIAGNOSIS — R0600 Dyspnea, unspecified: Secondary | ICD-10-CM | POA: Diagnosis not present

## 2021-11-13 DIAGNOSIS — I2510 Atherosclerotic heart disease of native coronary artery without angina pectoris: Secondary | ICD-10-CM

## 2021-11-13 DIAGNOSIS — I6200 Nontraumatic subdural hemorrhage, unspecified: Principal | ICD-10-CM | POA: Insufficient documentation

## 2021-11-13 DIAGNOSIS — Z96612 Presence of left artificial shoulder joint: Secondary | ICD-10-CM | POA: Diagnosis not present

## 2021-11-13 DIAGNOSIS — Z79899 Other long term (current) drug therapy: Secondary | ICD-10-CM | POA: Insufficient documentation

## 2021-11-13 DIAGNOSIS — G459 Transient cerebral ischemic attack, unspecified: Secondary | ICD-10-CM | POA: Diagnosis present

## 2021-11-13 DIAGNOSIS — Z95828 Presence of other vascular implants and grafts: Secondary | ICD-10-CM | POA: Diagnosis not present

## 2021-11-13 DIAGNOSIS — M79605 Pain in left leg: Secondary | ICD-10-CM | POA: Diagnosis not present

## 2021-11-13 DIAGNOSIS — S065XAA Traumatic subdural hemorrhage with loss of consciousness status unknown, initial encounter: Secondary | ICD-10-CM

## 2021-11-13 DIAGNOSIS — K449 Diaphragmatic hernia without obstruction or gangrene: Secondary | ICD-10-CM

## 2021-11-13 DIAGNOSIS — Z20822 Contact with and (suspected) exposure to covid-19: Secondary | ICD-10-CM | POA: Diagnosis not present

## 2021-11-13 DIAGNOSIS — R0602 Shortness of breath: Secondary | ICD-10-CM | POA: Diagnosis not present

## 2021-11-13 DIAGNOSIS — R609 Edema, unspecified: Secondary | ICD-10-CM | POA: Diagnosis present

## 2021-11-13 DIAGNOSIS — R29818 Other symptoms and signs involving the nervous system: Secondary | ICD-10-CM | POA: Diagnosis not present

## 2021-11-13 LAB — DIFFERENTIAL
Abs Immature Granulocytes: 0.03 10*3/uL (ref 0.00–0.07)
Basophils Absolute: 0 10*3/uL (ref 0.0–0.1)
Basophils Relative: 0 %
Eosinophils Absolute: 0.1 10*3/uL (ref 0.0–0.5)
Eosinophils Relative: 1 %
Immature Granulocytes: 0 %
Lymphocytes Relative: 25 %
Lymphs Abs: 2 10*3/uL (ref 0.7–4.0)
Monocytes Absolute: 0.8 10*3/uL (ref 0.1–1.0)
Monocytes Relative: 10 %
Neutro Abs: 5.2 10*3/uL (ref 1.7–7.7)
Neutrophils Relative %: 64 %

## 2021-11-13 LAB — APTT: aPTT: 28 seconds (ref 24–36)

## 2021-11-13 LAB — CBC
HCT: 40.6 % (ref 36.0–46.0)
Hemoglobin: 12.3 g/dL (ref 12.0–15.0)
MCH: 25.3 pg — ABNORMAL LOW (ref 26.0–34.0)
MCHC: 30.3 g/dL (ref 30.0–36.0)
MCV: 83.5 fL (ref 80.0–100.0)
Platelets: 181 10*3/uL (ref 150–400)
RBC: 4.86 MIL/uL (ref 3.87–5.11)
RDW: 20.4 % — ABNORMAL HIGH (ref 11.5–15.5)
WBC: 8.2 10*3/uL (ref 4.0–10.5)
nRBC: 0 % (ref 0.0–0.2)

## 2021-11-13 LAB — TROPONIN I (HIGH SENSITIVITY)
Troponin I (High Sensitivity): 28 ng/L — ABNORMAL HIGH (ref <18)
Troponin I (High Sensitivity): 29 ng/L — ABNORMAL HIGH (ref <18)

## 2021-11-13 LAB — COMPREHENSIVE METABOLIC PANEL
ALT: 14 U/L (ref 0–44)
AST: 21 U/L (ref 15–41)
Albumin: 4.1 g/dL (ref 3.5–5.0)
Alkaline Phosphatase: 56 U/L (ref 38–126)
Anion gap: 7 (ref 5–15)
BUN: 16 mg/dL (ref 8–23)
CO2: 27 mmol/L (ref 22–32)
Calcium: 9.7 mg/dL (ref 8.9–10.3)
Chloride: 104 mmol/L (ref 98–111)
Creatinine, Ser: 0.99 mg/dL (ref 0.44–1.00)
GFR, Estimated: 55 mL/min — ABNORMAL LOW (ref >60)
Glucose, Bld: 115 mg/dL — ABNORMAL HIGH (ref 70–99)
Potassium: 4.7 mmol/L (ref 3.5–5.1)
Sodium: 138 mmol/L (ref 135–145)
Total Bilirubin: 0.7 mg/dL (ref 0.3–1.2)
Total Protein: 7.4 g/dL (ref 6.5–8.1)

## 2021-11-13 LAB — PROTIME-INR
INR: 1 (ref 0.8–1.2)
Prothrombin Time: 13.2 seconds (ref 11.4–15.2)

## 2021-11-13 LAB — BRAIN NATRIURETIC PEPTIDE: B Natriuretic Peptide: 418.1 pg/mL — ABNORMAL HIGH (ref 0.0–100.0)

## 2021-11-13 MED ORDER — AMLODIPINE BESYLATE 10 MG PO TABS: 10.0000 mg | ORAL_TABLET | Freq: Every day | ORAL | Status: DC

## 2021-11-13 MED ORDER — PANTOPRAZOLE SODIUM 40 MG PO TBEC
40.0000 mg | DELAYED_RELEASE_TABLET | Freq: Every day | ORAL | Status: DC
  Administered 2021-11-13 – 2021-11-14 (×2): 40 mg via ORAL
  Filled 2021-11-13 (×2): qty 1

## 2021-11-13 MED ORDER — LEVOTHYROXINE SODIUM 88 MCG PO TABS: 88.0000 ug | ORAL_TABLET | Freq: Every day | ORAL | Status: DC

## 2021-11-13 MED ORDER — TEMAZEPAM 7.5 MG PO CAPS: 15.0000 mg | ORAL_CAPSULE | Freq: Every evening | ORAL | Status: DC | PRN

## 2021-11-13 MED ORDER — NITROGLYCERIN 0.4 MG SL SUBL: 0.4000 mg | SUBLINGUAL_TABLET | SUBLINGUAL | Status: DC | PRN

## 2021-11-13 MED ORDER — STROKE: EARLY STAGES OF RECOVERY BOOK: Freq: Once | Status: AC

## 2021-11-13 MED ORDER — ASPIRIN EC 81 MG PO TBEC: 81.0000 mg | DELAYED_RELEASE_TABLET | Freq: Every day | ORAL | Status: DC

## 2021-11-13 MED ORDER — ACETAMINOPHEN 650 MG RE SUPP: 650.0000 mg | RECTAL | Status: DC | PRN

## 2021-11-13 MED ORDER — VITAMIN B-12 1000 MCG PO TABS
1000.0000 ug | ORAL_TABLET | Freq: Every day | ORAL | Status: DC
Start: 2021-11-14 — End: 2021-11-14
  Administered 2021-11-14: 1000 ug via ORAL
  Filled 2021-11-13: qty 1

## 2021-11-13 MED ORDER — FERROUS SULFATE 325 (65 FE) MG PO TABS
325.0000 mg | ORAL_TABLET | Freq: Every day | ORAL | Status: DC
  Administered 2021-11-14: 325 mg via ORAL
  Filled 2021-11-13: qty 1

## 2021-11-13 MED ORDER — VITAMIN D 25 MCG (1000 UNIT) PO TABS
1000.0000 [IU] | ORAL_TABLET | Freq: Every day | ORAL | Status: DC
  Administered 2021-11-14: 1000 [IU] via ORAL
  Filled 2021-11-13: qty 1

## 2021-11-13 MED ORDER — ACETAMINOPHEN 160 MG/5ML PO SOLN
650.0000 mg | ORAL | Status: DC | PRN
  Filled 2021-11-13: qty 20.3

## 2021-11-13 MED ORDER — ACETAMINOPHEN 325 MG PO TABS
650.0000 mg | ORAL_TABLET | ORAL | Status: DC | PRN
Start: 2021-11-13 — End: 2021-11-14
  Administered 2021-11-13: 650 mg via ORAL
  Filled 2021-11-13: qty 2

## 2021-11-13 MED ORDER — LEVOTHYROXINE SODIUM 88 MCG PO TABS
88.0000 ug | ORAL_TABLET | Freq: Every day | ORAL | Status: DC
  Administered 2021-11-13: 88 ug via ORAL
  Filled 2021-11-13: qty 1

## 2021-11-13 MED ORDER — ROSUVASTATIN CALCIUM 10 MG PO TABS
10.0000 mg | ORAL_TABLET | Freq: Every day | ORAL | Status: DC
  Administered 2021-11-13: 10 mg via ORAL
  Filled 2021-11-13: qty 1

## 2021-11-13 NOTE — ED Triage Notes (Signed)
Pt via POV from home. Pt c/o L arm numbness and generalized weakness that started yesterday. No facial droop. States that also her bilateral legs and feet have been swollen. Denies hx of CHF. Pt also endorses CHF. Denies any major cardiac hx. Pt is A&OX4 and NAD ?

## 2021-11-13 NOTE — H&P (Signed)
?History and Physical  ? ? ?Patient: Kathryn Wise KDX:[id number] DOB: Mar 22, 1930 ?DOA: 11/13/2021 ?DOS: the patient was seen and examined on 11/13/2021 ?PCP: [name], MD  ?Patient coming from: Home ? ?Chief Complaint:  ?Chief Complaint  ?Patient presents with  ? Weakness  ? Numbness  ? ?HPI: Kathryn Wise is a 88 y.o. female with medical history significant for hypertension, hypothyroidism, depression, GERD, history of polymyalgia rheumatica, history of renal artery stenosis status post stent angioplasty, history of carotid artery stenosis status post stent angioplasty who presents to the ER via private vehicle for evaluation of multiple vague complaints which include sudden onset left arm numbness which happened at about 7:30 AM and has since resolved, bilateral lower extremity swelling and generalized weakness. ?She was brought to the ER by her friend who she called this morning when she developed numbness in her left arm.  Her friend states that she did not notice any slurred speech and that when she went to pick up the patient she could walk to the car but was unsteady on her feet.  Patient denies having any falls.  She denies having any headache, no difficulty swallowing, no blurred or double vision.  She feels weak but it is nonfocal. ?She has mild lower extremity swelling and denies any dietary indiscretion.  She has been compliant with her blood pressure medication and diet. ?She denies having any chest pain, no shortness of breath, no nausea, no vomiting, no dizziness, no lightheadedness, no blurred vision or focal deficit. ?Review of Systems: As mentioned in the history of present illness. All other systems reviewed and are negative. ?Past Medical History:  ?Diagnosis Date  ? Arthritis   ? Collagen vascular disease ([place])   ? Dysphagia, pharyngoesophageal phase 09/10/2013  ? Upper GI study with barium swallow was done at  [facility] Mar 2015   No reflux seen  Small irreducible hiatal hernia  Mild changes  of presbyeophagus (abnormal contractions of the esophagus that occur with aging) No strictures Normal gastric emptying  Incomplete visualization of stomach fold due to patient's inability turn    ? GERD (gastroesophageal reflux disease)   ? Heart murmur   ? has had years and years  ? History of shingles Dec 2013  ? treated with steroids , post op from shoulder surgery  ? History of squamous cell carcinoma 05/02/2016  ? right mid lateral pretibial  ? Hypertension   ? Hypothyroidism   ? Major depressive disorder, single episode 11/05/2015  ? Osteoarthritis of left shoulder 07/14/2012  ? Pneumonia 10/21/2020  ? Squamous cell carcinoma of skin 05/12/2016  ? R mid lat pretibial - other skin cancers treated by Dr. Sharlett Iles  ? Squamous cell carcinoma of skin 08/22/2020  ? left distal medial popliteal - [facility]  ? ?Past Surgical History:  ?Procedure Laterality Date  ? ARTHOSCOPIC ROTAOR CUFF REPAIR    ? LEFT  10+  YEARS    ? CAROTID PTA/STENT INTERVENTION Left 10/19/2020  ? Procedure: CAROTID PTA/STENT INTERVENTION;  Surgeon: Katha Cabal, MD;  Location: [place] [facility];  Service: Cardiovascular;  Laterality: Left;  ? CATARACT EXTRACTION W/ INTRAOCULAR LENS IMPLANT    ? RIGHT EYE  ? CATARACT EXTRACTION W/PHACO Left 03/28/2016  ? Procedure: CATARACT EXTRACTION PHACO AND INTRAOCULAR LENS PLACEMENT (IOC);  Surgeon: Leandrew Koyanagi, MD;  Location: [place];  Service: Ophthalmology;  Laterality: Left;  TORIC  ? FACELIFT    ? FOOT SURGERY    ? rt foot  TUMOR REMOVED  ? JOINT REPLACEMENT Left Dec 2013  ? shoulder  ? LEFT HEART CATH AND CORONARY ANGIOGRAPHY N/A 06/12/2017  ? Procedure: LEFT HEART CATH AND CORONARY ANGIOGRAPHY;  Surgeon: Minna Merritts, MD;  Location: [place] [facility];  Service: Cardiovascular;  Laterality: N/A;  ? RENAL ANGIOGRAPHY Left 01/07/2019  ? Procedure: RENAL ANGIOGRAPHY;  Surgeon: Katha Cabal, MD;  Location: [place] [facility];  Service: Cardiovascular;  Laterality: Left;   ? RENAL ARTERY STENT    ? SKIN CANCER EXCISION    ? TOTAL SHOULDER ARTHROPLASTY  07/14/2012  ? Procedure: TOTAL SHOULDER ARTHROPLASTY;  Surgeon: Johnny Bridge, MD;  Location: [place];  Service: Orthopedics;  Laterality: Left;  ? ?Social History:  reports that she quit smoking about 49 years ago. Her smoking use included cigarettes. She has never used smokeless tobacco. She reports current alcohol use. She reports that she does not use drugs. ? ?Allergies  ?Allergen Reactions  ? Alendronate Swelling  ?  And nausea  ?  ? Lisinopril   ?  Spike in BP  ? Metoprolol Other (See Comments)  ?  Lethargy - PT CURRENTLY TAKING   ? Oxycodone   ?  Other reaction(s): Other (See Comments) ?Altered mental status  ? Oxycontin [Oxycodone Hcl]   ?  Altered mental status  ? ? ?Family History  ?Problem Relation Age of Onset  ? Cancer Daughter   ?     Breast  ? Breast cancer Daughter 27  ? Cancer Son   ? ? ?Prior to Admission medications   ?Medication Sig Start Date End Date Taking? Authorizing Provider  ?amLODipine (NORVASC) 10 MG tablet Take 1 tablet (10 mg total) by mouth daily. 10/13/21 01/11/22  Enzo Bi, MD  ?aspirin EC 81 MG tablet Take 1 tablet (81 mg total) by mouth daily. Swallow whole. 10/20/20   Stegmayer, Joelene Millin A, PA-C  ?b complex vitamins capsule Take 1 capsule by mouth daily.    [provider]  ?Cholecalciferol (VITAMIN D-3) 1000 units CAPS Take 1,000 Units by mouth daily.    [provider]  ?Cyanocobalamin (VITAMIN B 12 PO) Take 1,000 mcg by mouth daily.    [provider]  ?Iron, Ferrous Sulfate, 325 (65 Fe) MG TABS Take 325 mg by mouth daily. 10/12/21   [name], MD  ?ketoconazole (NIZORAL) 2 % cream Apply 1 application. topically at bedtime. Apply to the feet and between the toes every night. 10/10/21   [name], MD  ?levothyroxine (SYNTHROID) 88 MCG tablet Take 1 tablet (88 mcg total) by mouth daily before breakfast. 09/11/21   [name], MD  ?nitroGLYCERIN (NITROSTAT)  0.4 MG SL tablet Place 1 tablet (0.4 mg total) every 5 (five) minutes as needed under the tongue for chest pain. 06/12/17   Demetrios Loll, MD  ?omeprazole (PRILOSEC) 20 MG capsule Take 1 capsule (20 mg total) by mouth daily as needed (heartburn). TAKE 1 CAPSULE BY MOUTH ONCE EVERY MORNING AS NEEDED 08/21/21   [name], MD  ?potassium chloride SA (KLOR-CON) 20 MEQ tablet Take 1 tablet (20 mEq total) by mouth daily. 10/24/20   Ezekiel Slocumb, DO  ?rosuvastatin (CRESTOR) 10 MG tablet Take 1 tablet (10 mg total) by mouth daily. 09/13/21   [name], MD  ?temazepam (RESTORIL) 15 MG capsule TAKE 1 CAPSULE BY MOUTH AT BEDTIME AS NEEDED SLEEP 08/21/21   [name], MD  ?Turmeric 500 MG CAPS Take 500 mg by mouth  daily.    [provider]  ? ? ?Physical Exam: ?Vitals:  ? 11/13/21 1000 11/13/21 1002 11/13/21 1225 11/13/21 1430  ?BP:  (!) 144/74 (!) 145/60 (!) 170/78  ?Pulse:  78 69 77  ?Resp:  '20 19 15  '$ ?Temp:  97.7 ?F (36.5 ?C) 98 ?F (36.7 ?C)   ?TempSrc:  Oral Oral   ?SpO2:  97% 98% 95%  ?Weight: 68.9 kg     ?Height: '5\' 5"'$  (1.651 m)     ? ?Physical Exam ?Vitals and nursing note reviewed.  ?Constitutional:   ?   Appearance: Normal appearance. She is normal weight.  ?HENT:  ?   Head: Normocephalic and atraumatic.  ?   Nose: Nose normal.  ?Eyes:  ?   Conjunctiva/sclera: Conjunctivae normal.  ?Cardiovascular:  ?   Rate and Rhythm: Normal rate and regular rhythm.  ?Pulmonary:  ?   Effort: Pulmonary effort is normal.  ?   Breath sounds: Normal breath sounds.  ?Abdominal:  ?   General: Abdomen is flat. Bowel sounds are normal.  ?   Palpations: Abdomen is soft.  ?Musculoskeletal:     ?   General: Normal range of motion.  ?   Cervical back: Normal range of motion and neck supple.  ?   Comments: Trace bilateral lower extremity swelling  ?Skin: ?   General: Skin is warm and dry.  ?Neurological:  ?   General: No focal deficit present.  ?   Mental Status: She is alert and oriented to person, place, and time.   ?Psychiatric:     ?   Mood and Affect: Mood normal.     ?   Behavior: Behavior normal.  ? ? ?Data Reviewed: ?Relevant notes from primary care and specialist visits, past discharge summaries as available in [facility]

## 2021-11-13 NOTE — ED Provider Notes (Addendum)
? ?[facility] ?Provider Note ? ? ? Event Date/Time  ? First MD Initiated Contact with Patient 11/13/21 1055   ?  (approximate) ? ? ?History  ? ?Weakness and Numbness ? ? ?HPI ? ?Kathryn Wise is a 88 y.o. female with history of carotid stent and kidney stents due to clogged arteries presents to the emergency department with complaints of numbness in the left arm, swelling of the lower legs, shortness of breath on exertion.  Symptoms started yesterday.  States both legs are swollen but the left is a little worse than the right.  Patient is supposed to see Dr. Rockey Situ.  Has not had her initial appointment yet.  Was seen here for elevated blood pressure and admitted about 4 weeks ago.  They did start her back on amlodipine.  She denies any fever or chills, no cough or congestion, no abdominal pain.  No chest pain. ? ?  ? ? ?Physical Exam  ? ?Triage Vital Signs: ?ED Triage Vitals  ?Enc Vitals Group  ?   BP 11/13/21 1002 (!) 144/74  ?   Pulse Rate 11/13/21 1002 78  ?   Resp 11/13/21 1002 20  ?   Temp 11/13/21 1002 97.7 ?F (36.5 ?C)  ?   Temp Source 11/13/21 1002 Oral  ?   SpO2 11/13/21 1002 97 %  ?   Weight 11/13/21 1000 152 lb (68.9 kg)  ?   Height 11/13/21 1000 '5\' 5"'$  (1.651 m)  ?   Head Circumference --   ?   Peak Flow --   ?   Pain Score 11/13/21 0959 0  ?   Pain Loc --   ?   Pain Edu? --   ?   Excl. in [place]? --   ? ? ?Most recent vital signs: ?Vitals:  ? 11/13/21 1225 11/13/21 1430  ?BP: (!) 145/60 (!) 170/78  ?Pulse: 69 77  ?Resp: 19 15  ?Temp: 98 ?F (36.7 ?C)   ?SpO2: 98% 95%  ? ? ? ?General: Awake, no distress.   ?CV:  Good peripheral perfusion. regular rate and  rhythm ?Resp:  Normal effort. Lungs CTA ?Abd:  No distention.  Nontender ?Other:  Swelling noted in the lower extremities bilaterally, worse on the left than the right, calfs are tender bilaterally ? ? ?ED Results / Procedures / Treatments  ? ?Labs ?(all labs ordered are listed, but only abnormal results are displayed) ?Labs Reviewed   ?CBC - Abnormal; Notable for the following components:  ?    Result Value  ? MCH 25.3 (*)   ? RDW 20.4 (*)   ? All other components within normal limits  ?COMPREHENSIVE METABOLIC PANEL - Abnormal; Notable for the following components:  ? Glucose, Bld 115 (*)   ? GFR, Estimated 55 (*)   ? All other components within normal limits  ?BRAIN NATRIURETIC PEPTIDE - Abnormal; Notable for the following components:  ? B Natriuretic Peptide 418.1 (*)   ? All other components within normal limits  ?TROPONIN I (HIGH SENSITIVITY) - Abnormal; Notable for the following components:  ? Troponin I (High Sensitivity) 28 (*)   ? All other components within normal limits  ?TROPONIN I (HIGH SENSITIVITY) - Abnormal; Notable for the following components:  ? Troponin I (High Sensitivity) 29 (*)   ? All other components within normal limits  ?PROTIME-INR  ?APTT  ?DIFFERENTIAL  ?CBG MONITORING, ED  ? ? ? ?EKG ? ?EKG ? ? ?RADIOLOGY ?Chest x-ray, CT of the head ? ? ? ?  PROCEDURES: ? ? ?Procedures ? ? ?MEDICATIONS ORDERED IN ED: ?Medications  ?aspirin EC tablet 81 mg (has no administration in time range)  ?nitroGLYCERIN (NITROSTAT) SL tablet 0.4 mg (has no administration in time range)  ?rosuvastatin (CRESTOR) tablet 10 mg (has no administration in time range)  ?temazepam (RESTORIL) capsule 15 mg (has no administration in time range)  ?levothyroxine (SYNTHROID) tablet 88 mcg (has no administration in time range)  ?pantoprazole (PROTONIX) EC tablet 40 mg (has no administration in time range)  ?Vitamin B 12 TABS 1,000 mcg (has no administration in time range)  ?Iron (Ferrous Sulfate) TABS 325 mg (has no administration in time range)  ?Vitamin D-3 CAPS 1,000 Units (has no administration in time range)  ? stroke: early stages of recovery book (has no administration in time range)  ?acetaminophen (TYLENOL) tablet 650 mg (has no administration in time range)  ?  Or  ?acetaminophen (TYLENOL) 160 MG/5ML solution 650 mg (has no administration in time  range)  ?  Or  ?acetaminophen (TYLENOL) suppository 650 mg (has no administration in time range)  ? ? ? ?IMPRESSION / MDM / ASSESSMENT AND PLAN / ED COURSE  ?I reviewed the triage vital signs and the nursing notes. ?             ?               ? ?Differential diagnosis includes, but is not limited to, CVA, DVT, CHF, MI, PE ? ?Initial labs are reassuring, patient CBC, metabolic panel, PT PTT are all normal ? ?Chest x-ray was independently reviewed by me does not show congestive heart failure or pneumonia.  Confirmed by radiology as no acute abnormality. ? ?CT of the head was independently reviewed by me.  Appears to be stable from CT done 1 month ago.  This is confirmed by radiology as no acute abnormality. ? ?Patient's troponin is slightly elevated at 28, BNP is elevated 418 ?Second troponin is stable at 29. ? ?Patient is actually getting lower extremity ultrasounds.  I did discuss admission with her at 6 she has a elevated BNP along with shortness of breath on exertion.  Discussed this with Dr. Ellender Hose.  He agrees patient should be admitted for cardiac rule out and CHF. ? ?Consult to hospitalist ? ?Dr Francine Graven would like for [place] to get a ct chest prior to admitting to see if there is fluid.  Chest CT shows calcifications of the coronary arteries.  Radiologist recommends associate with cardiac type symptoms. ? ?Hospitalist did not see the patient.  Due to the continued numbness along with the shortness of breath we will be admitting the patient.  Patient was in agreement for admission. ? ?MRI showed a 2 mm subdural hematoma.  Received a phone call from hospitalist.  I did notify Dr. Cari Caraway.  Recommends rescan in 6 hours. ? ?  ? ? ?FINAL CLINICAL IMPRESSION(S) / ED DIAGNOSES  ? ?Final diagnoses:  ?Numbness and tingling in left arm  ?Shortness of breath on exertion  ?Localized swelling of both lower legs  ? ? ? ?Rx / DC Orders  ? ?ED Discharge Orders   ? ? None  ? ?  ? ? ? ?Note:  This document was prepared using  Dragon voice recognition software and may include unintentional dictation errors. ? ?  ?Versie Starks, PA-C ?11/13/21 1536 ? ?  ?Versie Starks, PA-C ?11/13/21 1902 ? ?  ?Duffy Bruce, MD ?11/14/21 [phone] ? ?

## 2021-11-13 NOTE — Assessment & Plan Note (Deleted)
Chronic ?Pain control as needed ?

## 2021-11-13 NOTE — Assessment & Plan Note (Addendum)
Decrease dose of Norvasc.  Add low-dose Lasix.  Considering tapering Norvasc further as outpatient.  TED hose. ?

## 2021-11-13 NOTE — Assessment & Plan Note (Signed)
Stable Continue Synthroid 

## 2021-11-13 NOTE — Assessment & Plan Note (Addendum)
Can go back on aspirin in 3 days.  LDL 55.  Continue Crestor. ?

## 2021-11-13 NOTE — Assessment & Plan Note (Deleted)
Patient with a prior history of CVA with no focal deficits for evaluation of left arm numbness which has resolved and an unsteady gait. ?Initial CT scan of the head is negative for a bleed ?We will obtain MRI of the brain without contrast to rule out an acute stroke ?Continue aspirin and statin ?Allow for permissive hypertension until an acute stroke is ruled out ?Obtain 2D echocardiogram to assess LVEF and rule out cardiac thrombus ?We will request PT/OT/ST consult ?We will hold off on neurology consult unless MRI confirms an acute stroke ?

## 2021-11-14 DIAGNOSIS — G319 Degenerative disease of nervous system, unspecified: Secondary | ICD-10-CM | POA: Diagnosis not present

## 2021-11-14 DIAGNOSIS — R609 Edema, unspecified: Secondary | ICD-10-CM | POA: Diagnosis not present

## 2021-11-14 DIAGNOSIS — I2510 Atherosclerotic heart disease of native coronary artery without angina pectoris: Secondary | ICD-10-CM | POA: Diagnosis not present

## 2021-11-14 DIAGNOSIS — S065XAA Traumatic subdural hemorrhage with loss of consciousness status unknown, initial encounter: Secondary | ICD-10-CM

## 2021-11-14 DIAGNOSIS — E039 Hypothyroidism, unspecified: Secondary | ICD-10-CM | POA: Diagnosis not present

## 2021-11-14 DIAGNOSIS — Z8673 Personal history of transient ischemic attack (TIA), and cerebral infarction without residual deficits: Secondary | ICD-10-CM | POA: Diagnosis not present

## 2021-11-14 DIAGNOSIS — I6200 Nontraumatic subdural hemorrhage, unspecified: Secondary | ICD-10-CM | POA: Diagnosis not present

## 2021-11-14 DIAGNOSIS — R531 Weakness: Secondary | ICD-10-CM | POA: Diagnosis not present

## 2021-11-14 DIAGNOSIS — Z20822 Contact with and (suspected) exposure to covid-19: Secondary | ICD-10-CM | POA: Diagnosis not present

## 2021-11-14 DIAGNOSIS — Z87828 Personal history of other (healed) physical injury and trauma: Secondary | ICD-10-CM | POA: Diagnosis not present

## 2021-11-14 DIAGNOSIS — K449 Diaphragmatic hernia without obstruction or gangrene: Secondary | ICD-10-CM

## 2021-11-14 LAB — ECHOCARDIOGRAM COMPLETE
AR max vel: 2.23 cm2
AV Area VTI: 2.7 cm2
AV Area mean vel: 2.43 cm2
AV Mean grad: 8 mmHg
AV Peak grad: 15.7 mmHg
Ao pk vel: 1.98 m/s
Area-P 1/2: 2.76 cm2
Height: 65 in
MV VTI: 2.37 cm2
S' Lateral: 2.11 cm
Weight: 2432 oz

## 2021-11-14 LAB — LIPID PANEL
Cholesterol: 115 mg/dL (ref 0–200)
HDL: 42 mg/dL (ref >40)
LDL Cholesterol: 55 mg/dL (ref 0–99)
Total CHOL/HDL Ratio: 2.7 RATIO
Triglycerides: 91 mg/dL (ref <150)
VLDL: 18 mg/dL (ref 0–40)

## 2021-11-14 MED ORDER — HYDROCHLOROTHIAZIDE 12.5 MG PO TABS: 12.5000 mg | ORAL_TABLET | Freq: Every day | ORAL | 0 refills | Status: DC

## 2021-11-14 MED ORDER — POTASSIUM CHLORIDE CRYS ER 20 MEQ PO TBCR: 20.0000 meq | EXTENDED_RELEASE_TABLET | Freq: Every day | ORAL | 0 refills | Status: DC

## 2021-11-14 MED ORDER — AMLODIPINE BESYLATE 5 MG PO TABS
5.0000 mg | ORAL_TABLET | Freq: Every day | ORAL | Status: DC
  Administered 2021-11-14: 5 mg via ORAL
  Filled 2021-11-14: qty 1

## 2021-11-14 MED ORDER — ROSUVASTATIN CALCIUM 10 MG PO TABS: 10.0000 mg | ORAL_TABLET | Freq: Every day | ORAL | Status: DC

## 2021-11-14 MED ORDER — AMLODIPINE BESYLATE 5 MG PO TABS: 5.0000 mg | ORAL_TABLET | Freq: Every day | ORAL | 0 refills | Status: DC

## 2021-11-14 MED ORDER — HYDROCHLOROTHIAZIDE 12.5 MG PO TABS
12.5000 mg | ORAL_TABLET | Freq: Every day | ORAL | Status: DC
  Administered 2021-11-14: 12.5 mg via ORAL
  Filled 2021-11-14: qty 1

## 2021-11-14 MED ORDER — POTASSIUM CHLORIDE CRYS ER 20 MEQ PO TBCR
20.0000 meq | EXTENDED_RELEASE_TABLET | Freq: Every day | ORAL | Status: DC
  Administered 2021-11-14: 20 meq via ORAL
  Filled 2021-11-14: qty 1

## 2021-11-14 MED ORDER — FUROSEMIDE 20 MG PO TABS
20.0000 mg | ORAL_TABLET | Freq: Every day | ORAL | Status: DC
  Administered 2021-11-14: 20 mg via ORAL
  Filled 2021-11-14: qty 1

## 2021-11-14 MED ORDER — LEVOTHYROXINE SODIUM 88 MCG PO TABS: 88.0000 ug | ORAL_TABLET | Freq: Every day | ORAL | Status: DC

## 2021-11-14 MED ORDER — FUROSEMIDE 20 MG PO TABS: 20.0000 mg | ORAL_TABLET | Freq: Every day | ORAL | 0 refills | Status: DC

## 2021-11-14 NOTE — Discharge Instructions (Signed)
Can restart aspirin in 3 days ?

## 2021-11-14 NOTE — Plan of Care (Signed)
?  Problem: Education: ?Goal: Knowledge of General Education information will improve ?Description: Including pain rating scale, medication(s)/side effects and non-pharmacologic comfort measures ?Outcome: Progressing ?  ?Problem: Clinical Measurements: ?Goal: Ability to maintain clinical measurements within normal limits will improve ?Outcome: Progressing ?Goal: Will remain free from infection ?Outcome: Progressing ?Goal: Diagnostic test results will improve ?Outcome: Progressing ?Goal: Respiratory complications will improve ?Outcome: Progressing ?  ?Problem: Activity: ?Goal: Risk for activity intolerance will decrease ?Outcome: Progressing ?  ?Problem: Nutrition: ?Goal: Adequate nutrition will be maintained ?Outcome: Progressing ?  ?Problem: Coping: ?Goal: Level of anxiety will decrease ?Outcome: Progressing ?  ?Problem: Elimination: ?Goal: Will not experience complications related to bowel motility ?Outcome: Progressing ?Goal: Will not experience complications related to urinary retention ?Outcome: Progressing ?  ?Problem: Pain Managment: ?Goal: General experience of comfort will improve ?Outcome: Progressing ?  ?Problem: Safety: ?Goal: Ability to remain free from injury will improve ?Outcome: Progressing ?  ?

## 2021-11-14 NOTE — Consult Note (Signed)
? ?Referring Physician:  ?No referring provider defined for this encounter. ? ?Primary Physician:  ?[name], MD ? ?Chief Complaint:  weakness ?History of Present Illness: ?11/14/2021 ?Kathryn Wise is a 88 y.o. female who presents with the chief complaint of weakness and concern for possible transient ischemic attack.  Of note, she has history of carotid artery disease status post stenting. ? ?She had some left arm numbness but since resolved.  She denies any headache, nausea, or vomiting.  She thinks that her current neurologic status is consistent with her baseline.  She has no history of falls that she is aware of. ?During work-up, a possible subdural hematoma was identified.  I was consulted for review. ? ? ?Review of Systems:  ?A 10 point review of systems is negative, except for the pertinent positives and negatives detailed in the HPI. ? ?Past Medical History: ?Past Medical History:  ?Diagnosis Date  ? Arthritis   ? Collagen vascular disease ([place])   ? Dysphagia, pharyngoesophageal phase 09/10/2013  ? Upper GI study with barium swallow was done at  [facility] Mar 2015   No reflux seen  Small irreducible hiatal hernia  Mild changes of presbyeophagus (abnormal contractions of the esophagus that occur with aging) No strictures Normal gastric emptying  Incomplete visualization of stomach fold due to patient's inability turn    ? GERD (gastroesophageal reflux disease)   ? Heart murmur   ? has had years and years  ? History of shingles Dec 2013  ? treated with steroids , post op from shoulder surgery  ? History of squamous cell carcinoma 05/02/2016  ? right mid lateral pretibial  ? Hypertension   ? Hypothyroidism   ? Major depressive disorder, single episode 11/05/2015  ? Osteoarthritis of left shoulder 07/14/2012  ? Pneumonia 10/21/2020  ? Squamous cell carcinoma of skin 05/12/2016  ? R mid lat pretibial - other skin cancers treated by Dr. Sharlett Iles  ? Squamous cell carcinoma of skin 08/22/2020  ? left distal medial  popliteal - [facility]  ? ? ?Past Surgical History: ?Past Surgical History:  ?Procedure Laterality Date  ? ARTHOSCOPIC ROTAOR CUFF REPAIR    ? LEFT  10+  YEARS    ? CAROTID PTA/STENT INTERVENTION Left 10/19/2020  ? Procedure: CAROTID PTA/STENT INTERVENTION;  Surgeon: Katha Cabal, MD;  Location: [facility];  Service: Cardiovascular;  Laterality: Left;  ? CATARACT EXTRACTION W/ INTRAOCULAR LENS IMPLANT    ? RIGHT EYE  ? CATARACT EXTRACTION W/PHACO Left 03/28/2016  ? Procedure: CATARACT EXTRACTION PHACO AND INTRAOCULAR LENS PLACEMENT ([facility]);  Surgeon: Leandrew Koyanagi, MD;  Location: [facility];  Service: Ophthalmology;  Laterality: Left;  TORIC  ? FACELIFT    ? FOOT SURGERY    ? rt foot   TUMOR REMOVED  ? JOINT REPLACEMENT Left Dec 2013  ? shoulder  ? LEFT HEART CATH AND CORONARY ANGIOGRAPHY N/A 06/12/2017  ? Procedure: LEFT HEART CATH AND CORONARY ANGIOGRAPHY;  Surgeon: Minna Merritts, MD;  Location: [facility];  Service: Cardiovascular;  Laterality: N/A;  ? RENAL ANGIOGRAPHY Left 01/07/2019  ? Procedure: RENAL ANGIOGRAPHY;  Surgeon: Katha Cabal, MD;  Location: [facility];  Service: Cardiovascular;  Laterality: Left;  ? RENAL ARTERY STENT    ? SKIN CANCER EXCISION    ? TOTAL SHOULDER ARTHROPLASTY  07/14/2012  ? Procedure: TOTAL SHOULDER ARTHROPLASTY;  Surgeon: Johnny Bridge, MD;  Location: [facility];  Service: Orthopedics;  Laterality: Left;  ? ? ?  Allergies: ?Allergies as of 11/13/2021 - Review Complete 11/13/2021  ?Allergen Reaction Noted  ? Alendronate Swelling 10/08/2014  ? Lisinopril  11/03/2019  ? Metoprolol Other (See Comments) 10/03/2017  ? Oxycodone  07/09/2012  ? Oxycontin [oxycodone hcl]  07/09/2012  ? ? ?Medications: ? ?Current Facility-Administered Medications:  ?  acetaminophen (TYLENOL) tablet 650 mg, 650 mg, Oral, Q4H PRN, 650 mg at 11/13/21 2156 **OR** acetaminophen (TYLENOL) 160 MG/5ML solution 650 mg, 650 mg, Per Tube, Q4H PRN **OR** acetaminophen  (TYLENOL) suppository 650 mg, 650 mg, Rectal, Q4H PRN, Agbata, Tochukwu, MD ?  amLODipine (NORVASC) tablet 10 mg, 10 mg, Oral, Daily, Agbata, Tochukwu, MD ?  cholecalciferol (VITAMIN D3) tablet 1,000 Units, 1,000 Units, Oral, Daily, Agbata, Tochukwu, MD ?  ferrous sulfate tablet 325 mg, 325 mg, Oral, Q breakfast, Agbata, Tochukwu, MD ?  levothyroxine (SYNTHROID) tablet 88 mcg, 88 mcg, Oral, QHS, Foust, Katy L, NP, 88 mcg at 11/13/21 2150 ?  nitroGLYCERIN (NITROSTAT) SL tablet 0.4 mg, 0.4 mg, Sublingual, Q5 min PRN, Agbata, Tochukwu, MD ?  pantoprazole (PROTONIX) EC tablet 40 mg, 40 mg, Oral, Daily, Agbata, Tochukwu, MD, 40 mg at 11/13/21 1759 ?  rosuvastatin (CRESTOR) tablet 10 mg, 10 mg, Oral, QHS, Agbata, Tochukwu, MD, 10 mg at 11/13/21 2150 ?  temazepam (RESTORIL) capsule 15 mg, 15 mg, Oral, QHS PRN, Agbata, Tochukwu, MD ?  vitamin B-12 (CYANOCOBALAMIN) tablet 1,000 mcg, 1,000 mcg, Oral, Daily, Agbata, Tochukwu, MD ? ? ?Social History: ?Social History  ? ?Tobacco Use  ? Smoking status: Former  ?  Types: Cigarettes  ?  Quit date: 07/30/1972  ?  Years since quitting: 49.3  ? Smokeless tobacco: Never  ? Tobacco comments:  ?  smoked for only a few months  ?Vaping Use  ? Vaping Use: Never used  ?Substance Use Topics  ? Alcohol use: Yes  ?  Comment: OCCAS WINE   ? Drug use: No  ? ? ?Family Medical History: ?Family History  ?Problem Relation Age of Onset  ? Cancer Daughter   ?     Breast  ? Breast cancer Daughter 20  ? Cancer Son   ? ? ?Physical Examination: ?Vitals:  ? 11/14/21 0419 11/14/21 0721  ?BP: (!) 141/72 (!) 153/82  ?Pulse: 72 85  ?Resp:  16  ?Temp: 97.9 ?F (36.6 ?C) 98.1 ?F (36.7 ?C)  ?SpO2: 97% 94%  ? ? ? ?General: Patient is well developed, well nourished, calm, collected, and in no apparent distress. ? ?Psychiatric: Patient is non-anxious. ? ?Head:  Pupils equal, round, and reactive to light. ? ?ENT:  Oral mucosa appears well hydrated. ? ?Neck:   Supple.  Full range of motion. ? ?Respiratory: Patient is  breathing without any difficulty. ? ?Extremities: No edema. ? ?Vascular: Palpable pulses in dorsal pedal vessels. ? ?Skin:   On exposed skin, there are no abnormal skin lesions. ? ?NEUROLOGICAL:  ?General: In no acute distress.   ?Awake, alert, oriented to person, place, and time.  Pupils equal round and reactive to light.  Facial tone is symmetric.  Tongue protrusion is midline.  There is no pronator drift. ? ? ?Strength: ?Side Biceps Triceps Deltoid Interossei Grip Wrist Ext. Wrist Flex.  ?R '5 5 5 5 5 5 5  '$ ?L '5 5 5 5 5 5 5  '$ ? ?Side Iliopsoas Quads Hamstring PF DF EHL  ?R '5 5 5 5 5 5  '$ ?L '5 5 5 5 5 5  '$ ? ? ?Bilateral upper and lower extremity sensation is intact to  light touch. ?Reflexes are 1+ and symmetric at the biceps, triceps, brachioradialis, patella and achilles. Hoffman's is absent. ? ?Clonus is not present.  Toes are down-going.   ? ?Gait is untested.  ? ?Imaging: ?CT Head 11/13/21 (repeat) ?IMPRESSION: ?1. No correlate to the FLAIR abnormality seen on the earlier MRI, ?which may be a small amount of chronic subdural blood. ?2. Hypodensity of the white matter is most commonly associated with ?chronic microvascular disease. ?  ?  ?Electronically Signed ?  By: Ulyses Jarred M.D. ?  On: 11/14/2021 00:20 ? ?I have personally reviewed the images and agree with the above interpretation. ? ?Labs: ? ?  Latest Ref Rng & Units 11/13/2021  ?  9:58 AM 10/13/2021  ?  4:46 AM 10/12/2021  ? 12:04 PM  ?CBC  ?WBC 4.0 - 10.5 K/uL 8.2   7.5   8.3    ?Hemoglobin 12.0 - 15.0 g/dL 12.3   9.9   10.9    ?Hematocrit 36.0 - 46.0 % 40.6   32.7   36.0    ?Platelets 150 - 400 K/uL 181   163   166    ? ? ? ? ? ?Assessment and Plan: ?Ms. Lindo is a pleasant 88 y.o. female with possible small subdural hematoma on MRI scan.  This is not well visualized on CT imaging. ? ?I would not recommend any further imaging unless she has a change in mental status.  I think it is safe to continue aspirin.  I would not recommend antiepileptic medication given  the lack of subdural hematoma seen on CT scan, which normally drives decision-making for this condition. ? ?I do not think she needs neurosurgical follow-up.  However, I am available if she develops ongoing s

## 2021-11-14 NOTE — Assessment & Plan Note (Signed)
Seen on CT scan.  Patient states that she has a follow-up with cardiology as outpatient. ?

## 2021-11-14 NOTE — Progress Notes (Signed)
SLP Cancellation Note ? ?Patient Details ?Name: Kathryn Wise ?MRN: [id number] ?DOB: 04-May-1930 ? ? ?Cancelled treatment:       Reason Eval/Treat Not Completed: SLP screened, no needs identified, will sign off (chart reviewed; consulted NSG then met w/ pt in room. Pt was sitting EOB dressed to discharge.) ?Pt denied any difficulty swallowing and is currently on a regular diet; had eaten a full breakfast meal after walking w/ NSG around nursing station. Pt tolerates swallowing pills w/ water per NSG. Pt conversed in conversation w/out expressive/receptive deficits noted; pt denied any speech-language deficits. Speech clear. ?No further skilled ST services indicated as pt appears at her baseline. Pt agreed. NSG to reconsult if any change in status while admitted.   ? ? ? ? ?Orinda Kenner, MS, CCC-SLP ?Speech Language Pathologist ?Re[facility] Services; [place] ?[phone] (ascom) ?Fredric Slabach ?11/14/2021, 9:34 AM ?

## 2021-11-14 NOTE — Assessment & Plan Note (Signed)
Seen on CT scan

## 2021-11-14 NOTE — Care Management (Signed)
?  Transition of Care (TOC) Screening Note ? ? ?Patient Details  ?Name: Kathryn Wise ?Date of Birth: 1930-01-10 ? ? ?Transition of Care (TOC) CM/SW Contact:    ?Pete Pelt, RN ?Phone Number: ?11/14/2021, 8:58 AM ? ? ? ?Transition of Care Department [facility] has reviewed patient and no TOC needs have been identified at this time. We will continue to monitor patient advancement through interdisciplinary progression rounds. If new patient transition needs arise, please place a TOC consult. ?  ?

## 2021-11-14 NOTE — Progress Notes (Signed)
Late entry ?Patient presented to the ER for evaluation of left arm numbness which had resolved by the time of my evaluation but because of her history of prior CVA as well as carotid artery stenosis she was referred to observation status for possible TIA. ?Contacted by radiologist because patient had an abnormal MRI which showed 2 mm subdural hematoma overlying the right cerebral convexity. No mass effect on the underlying brain parenchyma or midline shift. No other evidence of acute intracranial pathology. ?Background moderate chronic white matter microangiopathy. ?Patient's aspirin was placed on hold ?Neurosurgery consult was requested and he recommended to repeat a CT scan of the brain without contrast in 6 hours for further evaluation. ?

## 2021-11-14 NOTE — Assessment & Plan Note (Signed)
Case discussed with neurosurgery.  No need for seizure medication prophylaxis.  Can go back on aspirin in a few days.  No need for neurosurgical follow-up as outpatient.  Follow-up with PMD. ?

## 2021-11-14 NOTE — Discharge Summary (Signed)
?Physician Discharge Summary ?  ?Patient: Kathryn Wise MRN: [id number] DOB: 08-27-1929  ?Admit date:     11/13/2021  ?Discharge date: 11/14/21  ?Discharge Physician: Loletha Grayer  ? ?PCP: [name], MD  ? ?Recommendations at discharge:  ? ?Follow-up PCP 5 days ? ?Discharge Diagnoses: ?Active Problems: ?  Subdural hematoma ([facility]CC) ?  Peripheral edema ?  History of CVA (cerebrovascular accident) without residual deficits ?  Hypothyroidism ?  Hiatal hernia ?  CAD (coronary atherosclerotic disease) ? ? ? ?Hospital Course: ?Patient was brought in as an observation on 11/13/2021 and discharged on 11/14/2021.  Patient was found to have a subdural hematoma.  She initially had some swelling of her lower extremity and felt some numbness on her left side.  She feels well and was able to ambulate without any issues with the nursing staff.  Seen by neurosurgery and cleared for discharge home and no need for follow-up from his standpoint.  Can go back on aspirin in 3 days and no need for seizure medication prophylaxis.  The patient complained of swelling of her lower extremities.  Initially had decreased the patient's Norvasc and added hydrochlorothiazide.  She stated the swelling had worsened a little bit so we changed the hydrochlorothiazide over to Lasix upon disposition.  Echocardiogram showed an EF of 50 to 55% with a thickened mitral valve with mild mitral stenosis choice which is insignificant as per cardiology.  Did have grade 1 diastolic dysfunction impaired relaxation. ? ?Assessment and Plan: ?Subdural hematoma ([place]) ?Case discussed with neurosurgery.  No need for seizure medication prophylaxis.  Can go back on aspirin in a few days.  No need for neurosurgical follow-up as outpatient.  Follow-up with PMD. ? ?Peripheral edema ?Decrease dose of Norvasc.  Add low-dose Lasix.  Considering tapering Norvasc further as outpatient.  TED hose. ? ?History of CVA (cerebrovascular accident) without residual deficits ?Can go  back on aspirin in 3 days.  LDL 55.  Continue Crestor. ? ?Hypothyroidism ?Stable ?Continue Synthroid ? ?CAD (coronary atherosclerotic disease) ?Seen on CT scan.  Patient states that she has a follow-up with cardiology as outpatient. ? ?Hiatal hernia ?Seen on CT scan. ? ? ? ? ?  ? ? ?Consultants: Neurosurgery ?Procedures performed: None ?Disposition: Home ?Diet recommendation:  ?Cardiac diet ?DISCHARGE MEDICATION: ?Allergies as of 11/14/2021   ? ?   Reactions  ? Alendronate Swelling  ? And nausea   ? Lisinopril   ? Spike in BP  ? Metoprolol Other (See Comments)  ? Lethargy - PT CURRENTLY TAKING   ? Oxycodone   ? Other reaction(s): Other (See Comments) ?Altered mental status  ? Oxycontin [oxycodone Hcl]   ? Altered mental status  ? ?  ? ?  ?Medication List  ?  ? ?STOP taking these medications   ? ?aspirin EC 81 MG tablet ?  ? ?  ? ?TAKE these medications   ? ?amLODipine 5 MG tablet ?Commonly known as: NORVASC ?Take 1 tablet (5 mg total) by mouth daily. ?What changed:  ?medication strength ?how much to take ?  ?b complex vitamins capsule ?Take 1 capsule by mouth daily. ?  ?furosemide 20 MG tablet ?Commonly known as: LASIX ?Take 1 tablet (20 mg total) by mouth daily. ?  ?Iron (Ferrous Sulfate) 325 (65 Fe) MG Tabs ?Take 325 mg by mouth daily. ?  ?ketoconazole 2 % cream ?Commonly known as: NIZORAL ?Apply 1 application. topically at bedtime. Apply to the feet and between the toes every night. ?  ?levothyroxine  88 MCG tablet ?Commonly known as: SYNTHROID ?Take 1 tablet (88 mcg total) by mouth at bedtime. ?  ?nitroGLYCERIN 0.4 MG SL tablet ?Commonly known as: NITROSTAT ?Place 1 tablet (0.4 mg total) every 5 (five) minutes as needed under the tongue for chest pain. ?  ?omeprazole 20 MG capsule ?Commonly known as: PRILOSEC ?Take 1 capsule (20 mg total) by mouth daily as needed (heartburn). TAKE 1 CAPSULE BY MOUTH ONCE EVERY MORNING AS NEEDED ?  ?potassium chloride SA 20 MEQ tablet ?Commonly known as: KLOR-CON M ?Take 1 tablet  (20 mEq total) by mouth daily. ?  ?rosuvastatin 10 MG tablet ?Commonly known as: CRESTOR ?Take 1 tablet (10 mg total) by mouth at bedtime. ?  ?temazepam 15 MG capsule ?Commonly known as: RESTORIL ?TAKE 1 CAPSULE BY MOUTH AT BEDTIME AS NEEDED SLEEP ?  ?Turmeric 500 MG Caps ?Take 500 mg by mouth daily. ?  ?VITAMIN B 12 PO ?Take 1,000 mcg by mouth daily. ?  ?Vitamin D-3 25 MCG (1000 UT) Caps ?Take 1,000 Units by mouth daily. ?  ? ?  ? ? Follow-up Information   ? ? [name], MD. Go on 11/22/2021.   ?Specialty: Internal Medicine ?Why: @ 11am ?Contact information: ?[address] ?[address] ?[place] [address] ?[phone] ? ? ?  ?  ? ?  ?  ? ?  ? ?Discharge Exam: ?Filed Weights  ? 11/13/21 1000  ?Weight: 68.9 kg  ? ?Physical Exam ?HENT:  ?   Head: Normocephalic.  ?   Mouth/Throat:  ?   Pharynx: No oropharyngeal exudate.  ?Eyes:  ?   General: Lids are normal.  ?   Conjunctiva/sclera: Conjunctivae normal.  ?Cardiovascular:  ?   Rate and Rhythm: Normal rate and regular rhythm.  ?   Heart sounds: Normal heart sounds, S1 normal and S2 normal.  ?Pulmonary:  ?   Breath sounds: No decreased breath sounds, wheezing, rhonchi or rales.  ?Abdominal:  ?   Palpations: Abdomen is soft.  ?   Tenderness: There is no abdominal tenderness.  ?Musculoskeletal:  ?   Right lower leg: Swelling present.  ?   Left lower leg: Swelling present.  ?Skin: ?   General: Skin is warm.  ?   Findings: No rash.  ?Neurological:  ?   Mental Status: She is alert and oriented to person, place, and time.  ?  ? ?Condition at discharge: stable ? ?The results of significant diagnostics from this hospitalization (including imaging, microbiology, ancillary and laboratory) are listed below for reference.  ? ?Imaging Studies: ?DG Chest 2 View ? ?Result Date: 11/13/2021 ?CLINICAL DATA:  LEFT arm numbness EXAM: CHEST - 2 VIEW COMPARISON:  10/12/2021 FINDINGS: Normal mediastinum and cardiac silhouette. Normal pulmonary vasculature. No evidence of effusion,  infiltrate, or pneumothorax. No acute bony abnormality. LEFT shoulder prosthetic IMPRESSION: No acute cardiopulmonary process. Electronically Signed   By: Suzy Bouchard M.D.   On: 11/13/2021 10:29  ? ?CT HEAD WO CONTRAST (5MM) ? ?Result Date: 11/14/2021 ?CLINICAL DATA:  Subdural hematoma follow-up EXAM: CT HEAD WITHOUT CONTRAST TECHNIQUE: Contiguous axial images were obtained from the base of the skull through the vertex without intravenous contrast. RADIATION DOSE REDUCTION: This exam was performed according to the departmental dose-optimization program which includes automated exposure control, adjustment of the mA and/or kV according to patient size and/or use of iterative reconstruction technique. COMPARISON:  Head CT and MRI 11/13/2021 FINDINGS: Brain: There is no mass, hemorrhage or extra-axial collection visible. No correlate to the FLAIR abnormality  seen on the earlier MRI. There is generalized atrophy without lobar predilection. Hypodensity of the white matter is most commonly associated with chronic microvascular disease. Vascular: No abnormal hyperdensity of the major intracranial arteries or dural venous sinuses. No intracranial atherosclerosis. Skull: The visualized skull base, calvarium and extracranial soft tissues are normal. Sinuses/Orbits: No fluid levels or advanced mucosal thickening of the visualized paranasal sinuses. No mastoid or middle ear effusion. The orbits are normal. IMPRESSION: 1. No correlate to the FLAIR abnormality seen on the earlier MRI, which may be a small amount of chronic subdural blood. 2. Hypodensity of the white matter is most commonly associated with chronic microvascular disease. Electronically Signed   By: Ulyses Jarred M.D.   On: 11/14/2021 00:20  ? ?CT HEAD WO CONTRAST (5MM) ? ?Result Date: 11/13/2021 ?CLINICAL DATA:  Acute neuro deficit.  LEFT arm numbness EXAM: CT HEAD WITHOUT CONTRAST TECHNIQUE: Contiguous axial images were obtained from the base of the skull  through the vertex without intravenous contrast. RADIATION DOSE REDUCTION: This exam was performed according to the departmental dose-optimization program which includes automated exposure control, adjustment of

## 2021-11-14 NOTE — Progress Notes (Signed)
PT Cancellation Note ? ?Patient Details ?Name: Kathryn Wise ?MRN: [id number] ?DOB: September 08, 1929 ? ? ?Cancelled Treatment:    Reason Eval/Treat Not Completed: PT screened, no needs identified, will sign off (Per attending physician, patient ambulating indep; no acute PT needs at this time.  Order completed.  Please re-consult should needs change.) ? ?Tashya Alberty H. Owens Shark, PT, DPT, NCS ?11/14/21, 9:37 AM ?[phone] ? ?

## 2021-11-15 NOTE — Progress Notes (Signed)
? ?Cardiology Office Note   ? ?Date:  11/20/2021  ? ?ID:  Kathryn Wise, DOB [birth year], MRN [id number] ? ?PCP:  [name], MD  ?Cardiologist:  Nelva Bush, MD  ?Electrophysiologist:  None  ? ?Chief Complaint: Hospital follow up ? ?History of Present Illness:  ? ?Kathryn Wise is a 87 y.o. female with history of nonobstructive CAD, HFpEF, difficult to control hypertension with left renal artery stenosis status post stenting in 12/2018, carotid artery disease status post left sided stenting in 09/2020, possible small SDH noted in 10/2021, LBBB, HLD, mitral regurgitation, and LVOT gradient who presents for hospital follow-up as outlined below. ? ?She was previously followed by Dr. [name] in 2017 for cardiac murmur with echo at that time demonstrating an EF of 60 to 65%, normal wall motion, grade 1 diastolic dysfunction, mildly dilated aortic root and ascending aorta, mild mitral regurgitation, normal RV systolic function and PASP.  She was admitted for chest pain in 05/2017 with Lexiscan being abnormal.  She subsequently underwent LHC in 05/2017, which demonstrated nonobstructive CAD involving the LCx and RCA with medical management recommended.  Echo in 05/2017 showed an EF of 55 to 60%, normal wall motion, grade 1 diastolic dysfunction, very mild aortic stenosis, moderate mitral regurgitation, mildly dilated left atrium, normal RV systolic function, and a PASP of 50 mmHg.  Following this, she was lost to follow-up. ? ?She was admitted to the hospital in 09/2020 with pneumonia and HFpEF.  Echo demonstrated an EF of 55 to 60%, moderate LVH, grade 2 diastolic dysfunction with moderate LVOT obstruction with a peak gradient of 68 mmHg at rest and 74 mmHg with Valsalva, normal RV systolic function and ventricular cavity size, moderately elevated PASP estimated at 46.5 mmHg, moderately dilated left atrium, mild mitral regurgitation, mild mitral stenosis, moderate mitral annular calcification, and mild to moderate  aortic valve sclerosis without evidence of stenosis.  High-sensitivity troponin trended to 976.  She has been lost to cardiology follow-up since. ? ?She was admitted in 09/2021 with hypertensive urgency with noted improvement in BP following 10 mg of amlodipine and 5 mg of IV labetalol.  She was noted to have a mildly elevated high-sensitivity troponin peaking at 36 felt to be related to supply demand ischemia from hypertension. ? ?She was most recently admitted to the hospital earlier this month noting left upper extremity numbness that was transient, bilateral lower extremity swelling, and generalized weakness.  She was consulted on by neurosurgery for possible TIA.  MRI of the brain showed a possible 2 mm subdural hematoma overlying the right cerebral convexity.  This was not well visualized on CT imaging.  She was unaware of any fall/trauma.  There was recommended the patient continue aspirin without need for antiepileptic medication given lack of subdural hematoma noted on CT imaging.  Echo, as read by outside group, showed an EF of 50 to 55%, no regional wall motion normalities, mild concentric LVH, grade 1 diastolic dysfunction, normal RV systolic function and ventricular cavity size, myxomatous mitral valve with mild stenosis, and mild aortic insufficiency.  Lower extremity ultrasound was negative for DVT bilaterally.  High-sensitivity troponin 28 with a delta troponin of 29.  BNP 418. ? ?She comes in today accompanied by her daughter.  She is feeling well from a cardiac perspective, without symptoms of angina or decompensation.  She does note an approximate 4-week history of left jaw pain.  In this setting, she had teeth extracted along the left side, though has not noted  any significant improvement in her jaw discomfort.  Jaw discomfort is exacerbated by exertion, chewing, and yawning.  She is without symptoms of frank chest pain.  She has not had any further left upper extremity paresthesia.  She reports  being told that she had a murmur approximately 30 years ago.  No significant dizziness, presyncope, or syncope.  She does note mild bilateral pretibial lower extremity swelling. ? ? ?Labs independently reviewed: ?10/2021 - TC 115, TG 91, HDL 42, LDL 55, potassium 4.7, BUN 16, serum creatinine 0.99, albumin 4.1, AST/ALT normal, Hgb 12.3, PLT 181 ?08/2021 - A1c 6.4, TSH normal ? ?Past Medical History:  ?Diagnosis Date  ? Arthritis   ? Collagen vascular disease ([place])   ? Dysphagia, pharyngoesophageal phase 09/10/2013  ? Upper GI study with barium swallow was done at  [facility] Mar 2015   No reflux seen  Small irreducible hiatal hernia  Mild changes of presbyeophagus (abnormal contractions of the esophagus that occur with aging) No strictures Normal gastric emptying  Incomplete visualization of stomach fold due to patient's inability turn    ? GERD (gastroesophageal reflux disease)   ? Heart murmur   ? has had years and years  ? History of shingles Dec 2013  ? treated with steroids , post op from shoulder surgery  ? History of squamous cell carcinoma 05/02/2016  ? right mid lateral pretibial  ? Hypertension   ? Hypothyroidism   ? Major depressive disorder, single episode 11/05/2015  ? Osteoarthritis of left shoulder 07/14/2012  ? Pneumonia 10/21/2020  ? Squamous cell carcinoma of skin 05/12/2016  ? R mid lat pretibial - other skin cancers treated by Dr. Sharlett Iles  ? Squamous cell carcinoma of skin 08/22/2020  ? left distal medial popliteal - [facility]  ? ? ?Past Surgical History:  ?Procedure Laterality Date  ? ARTHOSCOPIC ROTAOR CUFF REPAIR    ? LEFT  10+  YEARS    ? CAROTID PTA/STENT INTERVENTION Left 10/19/2020  ? Procedure: CAROTID PTA/STENT INTERVENTION;  Surgeon: Katha Cabal, MD;  Location: [facility];  Service: Cardiovascular;  Laterality: Left;  ? CATARACT EXTRACTION W/ INTRAOCULAR LENS IMPLANT    ? RIGHT EYE  ? CATARACT EXTRACTION W/PHACO Left 03/28/2016  ? Procedure: CATARACT EXTRACTION PHACO AND INTRAOCULAR LENS  PLACEMENT (IOC);  Surgeon: Leandrew Koyanagi, MD;  Location: [facility];  Service: Ophthalmology;  Laterality: Left;  TORIC  ? FACELIFT    ? FOOT SURGERY    ? rt foot   TUMOR REMOVED  ? JOINT REPLACEMENT Left Dec 2013  ? shoulder  ? LEFT HEART CATH AND CORONARY ANGIOGRAPHY N/A 06/12/2017  ? Procedure: LEFT HEART CATH AND CORONARY ANGIOGRAPHY;  Surgeon: Minna Merritts, MD;  Location: [facility];  Service: Cardiovascular;  Laterality: N/A;  ? RENAL ANGIOGRAPHY Left 01/07/2019  ? Procedure: RENAL ANGIOGRAPHY;  Surgeon: Katha Cabal, MD;  Location: [facility];  Service: Cardiovascular;  Laterality: Left;  ? RENAL ARTERY STENT    ? SKIN CANCER EXCISION    ? TOTAL SHOULDER ARTHROPLASTY  07/14/2012  ? Procedure: TOTAL SHOULDER ARTHROPLASTY;  Surgeon: Johnny Bridge, MD;  Location: [facility];  Service: Orthopedics;  Laterality: Left;  ? ? ?Current Medications: ?Current Meds  ?Medication Sig  ? b complex vitamins capsule Take 1 capsule by mouth daily.  ? Cholecalciferol (VITAMIN D-3) 1000 units CAPS Take 1,000 Units by mouth daily.  ? Cyanocobalamin (VITAMIN B 12 PO) Take 1,000 mcg by mouth daily.  ?  diltiazem (CARDIZEM CD) 180 MG 24 hr capsule Take 1 capsule (180 mg total) by mouth daily.  ? furosemide (LASIX) 20 MG tablet Take 1 tablet (20 mg total) by mouth daily.  ? Iron, Ferrous Sulfate, 325 (65 Fe) MG TABS Take 325 mg by mouth daily.  ? ketoconazole (NIZORAL) 2 % cream Apply 1 application. topically at bedtime. Apply to the feet and between the toes every night.  ? levothyroxine (SYNTHROID) 88 MCG tablet Take 1 tablet (88 mcg total) by mouth at bedtime.  ? nitroGLYCERIN (NITROSTAT) 0.4 MG SL tablet Place 1 tablet (0.4 mg total) every 5 (five) minutes as needed under the tongue for chest pain.  ? omeprazole (PRILOSEC) 20 MG capsule Take 1 capsule (20 mg total) by mouth daily as needed (heartburn). TAKE 1 CAPSULE BY MOUTH ONCE EVERY MORNING AS NEEDED  ? potassium chloride SA (KLOR-CON  M) 20 MEQ tablet Take 1 tablet (20 mEq total) by mouth daily.  ? rosuvastatin (CRESTOR) 10 MG tablet Take 1 tablet (10 mg total) by mouth at bedtime.  ? temazepam (RESTORIL) 15 MG capsule TAKE 1 CAPSULE B

## 2021-11-20 ENCOUNTER — Ambulatory Visit (INDEPENDENT_AMBULATORY_CARE_PROVIDER_SITE_OTHER): Payer: Medicare Other | Admitting: Physician Assistant

## 2021-11-20 ENCOUNTER — Encounter: Payer: Self-pay | Admitting: Physician Assistant

## 2021-11-20 VITALS — BP 160/72 | HR 69 | Ht 65.0 in | Wt 151.6 lb

## 2021-11-20 DIAGNOSIS — I5032 Chronic diastolic (congestive) heart failure: Secondary | ICD-10-CM

## 2021-11-20 DIAGNOSIS — I421 Obstructive hypertrophic cardiomyopathy: Secondary | ICD-10-CM | POA: Diagnosis not present

## 2021-11-20 DIAGNOSIS — I447 Left bundle-branch block, unspecified: Secondary | ICD-10-CM

## 2021-11-20 DIAGNOSIS — S065XAA Traumatic subdural hemorrhage with loss of consciousness status unknown, initial encounter: Secondary | ICD-10-CM

## 2021-11-20 DIAGNOSIS — R072 Precordial pain: Secondary | ICD-10-CM | POA: Diagnosis not present

## 2021-11-20 DIAGNOSIS — I2510 Atherosclerotic heart disease of native coronary artery without angina pectoris: Secondary | ICD-10-CM | POA: Diagnosis not present

## 2021-11-20 DIAGNOSIS — I059 Rheumatic mitral valve disease, unspecified: Secondary | ICD-10-CM | POA: Diagnosis not present

## 2021-11-20 DIAGNOSIS — I6522 Occlusion and stenosis of left carotid artery: Secondary | ICD-10-CM | POA: Diagnosis not present

## 2021-11-20 DIAGNOSIS — I701 Atherosclerosis of renal artery: Secondary | ICD-10-CM

## 2021-11-20 DIAGNOSIS — I10 Essential (primary) hypertension: Secondary | ICD-10-CM | POA: Diagnosis not present

## 2021-11-20 MED ORDER — DILTIAZEM HCL ER COATED BEADS 180 MG PO CP24: 180.0000 mg | ORAL_CAPSULE | Freq: Every day | ORAL | 3 refills | Status: DC

## 2021-11-20 NOTE — Patient Instructions (Signed)
Medication Instructions:  ?Your physician has recommended you make the following change in your medication:  ? ?STOP Amlodipine ?START Cardizem CD 180 mg once daily  ? ?*If you need a refill on your cardiac medications before your next appointment, please call your pharmacy* ? ? ?Lab Work: ?None ? ?If you have labs (blood work) drawn today and your tests are completely normal, you will receive your results only by: ?MyChart Message (if you have MyChart) OR ?A paper copy in the mail ?If you have any lab test that is abnormal or we need to change your treatment, we will call you to review the results. ? ? ?Testing/Procedures: ?Your physician has requested that you have a limited echocardiogram. Echocardiography is a painless test that uses sound waves to create images of your heart. It provides your doctor with information about the size and shape of your heart and how well your heart?s chambers and valves are working. This procedure takes approximately one hour. There are no restrictions for this procedure. ? ?[place] ? ?Your caregiver has ordered a Stress Test with nuclear imaging. The purpose of this test is to evaluate the blood supply to your heart muscle. This procedure is referred to as a "Non-Invasive Stress Test." This is because other than having an IV started in your vein, nothing is inserted or "invades" your body. Cardiac stress tests are done to find areas of poor blood flow to the heart by determining the extent of coronary artery disease (CAD). Some patients exercise on a treadmill, which naturally increases the blood flow to your heart, while others who are  unable to walk on a treadmill due to physical limitations have a pharmacologic/chemical stress agent called Lexiscan . This medicine will mimic walking on a treadmill by temporarily increasing your coronary blood flow.  ? ?Please note: these test may take anywhere between 2-4 hours to complete ? ?PLEASE REPORT TO [facility]TR[facility]   ?THE VOLUNTEERS AT THE FIRST DESK WILL DIRECT YOU WHERE TO GO ? ?Date of Procedure:________________ ? ?Arrival Time for Procedure:_______________________ ? ?Instructions regarding medication:  ? ?_XX___:  Hold the following medications the day of your procedure. You may take them once you return home.  ? ?HOLD FUROSEMIDE ?HOLD POTASSIUM ? ?PLEASE NOTIFY THE OFFICE AT LEAST 38 HOURS IN ADVANCE IF YOU ARE UNABLE TO KEEP YOUR APPOINTMENT.  [phone] ?AND  ?PLEASE NOTIFY NUCLEAR MEDICINE AT [facility] AT LEAST 2 HOURS IN ADVANCE IF YOU ARE UNABLE TO KEEP YOUR APPOINTMENT. [phone] ? ?How to prepare for your Myoview test: ? ?Do not eat or drink after midnight ?No caffeine for 24 hours prior to test ?No smoking 24 hours prior to test. ?Your medication may be taken with water.  If your doctor stopped a medication because of this test, do not take that medication. ?Ladies, please do not wear dresses.  Skirts or pants are appropriate. Please wear a short sleeve shirt. ?No perfume, cologne or lotion. ?Wear comfortable walking shoes. No heels! ? ? ?Follow-Up: ?At [facility], you and your health needs are our priority.  As part of our continuing mission to provide you with exceptional heart care, we have created designated Provider Care Teams.  These Care Teams include your primary Cardiologist (physician) and Advanced Practice Providers (APPs -  Physician Assistants and Nurse Practitioners) who all work together to provide you with the care you need, when you need it. ? ? ?Your next appointment:   ?1 month(s) ? ?The format for your next appointment:   ?  In Person ? ?Provider:   ?Nelva Bush, MD or Christell Faith, PA-C  ? ? ? ? ? ?Important Information About Sugar ? ? ? ? ?  ?

## 2021-11-22 ENCOUNTER — Encounter: Payer: Self-pay | Admitting: Internal Medicine

## 2021-11-22 ENCOUNTER — Ambulatory Visit (INDEPENDENT_AMBULATORY_CARE_PROVIDER_SITE_OTHER): Payer: Medicare Other | Admitting: Internal Medicine

## 2021-11-22 VITALS — BP 162/72 | HR 69 | Temp 97.7°F | Ht 65.0 in | Wt 153.2 lb

## 2021-11-22 DIAGNOSIS — Z66 Do not resuscitate: Secondary | ICD-10-CM | POA: Diagnosis not present

## 2021-11-22 DIAGNOSIS — Z8673 Personal history of transient ischemic attack (TIA), and cerebral infarction without residual deficits: Secondary | ICD-10-CM

## 2021-11-22 DIAGNOSIS — I150 Renovascular hypertension: Secondary | ICD-10-CM

## 2021-11-22 DIAGNOSIS — I160 Hypertensive urgency: Secondary | ICD-10-CM | POA: Diagnosis not present

## 2021-11-22 DIAGNOSIS — Z09 Encounter for follow-up examination after completed treatment for conditions other than malignant neoplasm: Secondary | ICD-10-CM

## 2021-11-22 NOTE — Assessment & Plan Note (Addendum)
Needs to bring her home machine for calibration  given the differential of 40 pts continue diltiazem and furosemide ?

## 2021-11-22 NOTE — Patient Instructions (Addendum)
Please schedule a nurse visit at your convenience and bring your blood pressure machine with you ? ? ? We will check your potassium when you return  ?

## 2021-11-22 NOTE — Progress Notes (Signed)
? ?Subjective:  ?Patient ID: Kathryn Wise, female    DOB: 11-21-1929  Age: 88 y.o. MRN: [id number] ? ?CC: The primary encounter diagnosis was DNR (do not resuscitate). Diagnoses of Renovascular hypertension, Hospital discharge follow-up, Hypertensive urgency, and History of TIA (transient ischemic attack) were also pertinent to this visit. ? ? ?This visit occurred during the SARS-CoV-2 public health emergency.  Safety protocols were in place, including screening questions prior to the visit, additional usage of staff PPE, and extensive cleaning of exam room while observing appropriate contact time as indicated for disinfecting solutions.   ? ?HPI ?Kathryn Wise presents for  ?Chief Complaint  ?Patient presents with  ? Hospitalization Follow-up  ?  Hospital follow up on elevated blood pressure, arm numbness, jaw pain  ?88 yr female with PAD , CAD sp carotid stent.,  Intermittent hypertension  presents for follow up on ER visits and hospitalizations over the last  month  ? ?1) ER visit for uncontrolled hypertension,  took amlodipine at home for systolic 983 but BP continued to climb. Taken to ER by ambulance  BP was  382 systolic .  Was  given 10 mg amlodipine and IV labetalol 5 mg   Troponin was elevated and attributed to demand ischemia.    Sent home the following day  ? ?1)  she was  admitted to to [facility] on April 17  with  weakness ,  left arm numbness ,  lower extremity edema.  Was  diagnosed with TIA with possible SDH (small) suggested on  MRI but not on CT head.  Seen by neurosurgery ;  no follow up needed unless new symptoms arise  .  Continue asa   no anti seizure meds needed  ? ?2) HTN:  reviewed ER visit from March 16 for uncontrolled htn;  amlodipine restarted  at 5 mg daily  but change to diltiazem by cardiology for [place] with documented intolerance to metoprolol .  Lexican scheduled for evaluation of known CAD   with c/o left arm pain and elevated troponin.   ? ?Since discharge she has been checking  bp at home 120/62   2 hours ago .    JUST FEELS WEAK bc  SHE HAS BEEN OFF HER EXERCISE .  Has been taking 20 mg lasix daily for one week  and LE edema has improved .  ? ?3)  eyelid surgery  planned for  May 19  by Kathryn Wise   ? ?Outpatient Medications Prior to Visit  ?Medication Sig Dispense Refill  ? b complex vitamins capsule Take 1 capsule by mouth daily.    ? Cholecalciferol (VITAMIN D-3) 1000 units CAPS Take 1,000 Units by mouth daily.    ? Cyanocobalamin (VITAMIN B 12 PO) Take 1,000 mcg by mouth daily.    ? diltiazem (CARDIZEM CD) 180 MG 24 hr capsule Take 1 capsule (180 mg total) by mouth daily. 90 capsule 3  ? furosemide (LASIX) 20 MG tablet Take 1 tablet (20 mg total) by mouth daily. 30 tablet 0  ? Iron, Ferrous Sulfate, 325 (65 Fe) MG TABS Take 325 mg by mouth daily. 30 tablet 2  ? ketoconazole (NIZORAL) 2 % cream Apply 1 application. topically at bedtime. Apply to the feet and between the toes every night. 30 g 2  ? levothyroxine (SYNTHROID) 88 MCG tablet Take 1 tablet (88 mcg total) by mouth at bedtime.    ? nitroGLYCERIN (NITROSTAT) 0.4 MG SL tablet Place 1 tablet (0.4 mg total) every 5 (five)  minutes as needed under the tongue for chest pain. 30 tablet 2  ? omeprazole (PRILOSEC) 20 MG capsule Take 1 capsule (20 mg total) by mouth daily as needed (heartburn). TAKE 1 CAPSULE BY MOUTH ONCE EVERY MORNING AS NEEDED 90 capsule 2  ? potassium chloride SA (KLOR-CON M) 20 MEQ tablet Take 1 tablet (20 mEq total) by mouth daily. 30 tablet 0  ? rosuvastatin (CRESTOR) 10 MG tablet Take 1 tablet (10 mg total) by mouth at bedtime.    ? temazepam (RESTORIL) 15 MG capsule TAKE 1 CAPSULE BY MOUTH AT BEDTIME AS NEEDED SLEEP 30 capsule 5  ? Turmeric 500 MG CAPS Take 500 mg by mouth daily.    ? ?No facility-administered medications prior to visit.  ? ? ?Review of Systems; ? ?Patient denies headache, fevers, malaise, unintentional weight loss, skin rash, eye pain, sinus congestion and sinus pain, sore throat, dysphagia,   hemoptysis , cough, dyspnea, wheezing, chest pain, palpitations, orthopnea, edema, abdominal pain, nausea, melena, diarrhea, constipation, flank pain, dysuria, hematuria, urinary  Frequency, nocturia, numbness, tingling, seizures,  Focal weakness, Loss of consciousness,  Tremor, insomnia, depression, anxiety, and suicidal ideation.   ? ? ? ?Objective:  ?BP (!) 162/72 (BP Location: Left Arm, Patient Position: Sitting, Cuff Size: Normal)   Pulse 69   Temp 97.7 ?F (36.5 ?C) (Oral)   Ht '5\' 5"'$  (1.651 m)   Wt 153 lb 3.2 oz (69.5 kg)   SpO2 97%   BMI 25.49 kg/m?  ? ?BP Readings from Last 3 Encounters:  ?11/22/21 (!) 162/72  ?11/20/21 (!) 160/72  ?11/14/21 (!) 153/82  ? ? ?Wt Readings from Last 3 Encounters:  ?11/22/21 153 lb 3.2 oz (69.5 kg)  ?11/20/21 151 lb 9.6 oz (68.8 kg)  ?11/13/21 152 lb (68.9 kg)  ? ? ?General appearance: alert, cooperative and appears stated age ?Ears: normal TM's and external ear canals both ears ?Throat: lips, mucosa, and tongue normal; teeth and gums normal ?Neck: no adenopathy, no carotid bruit, supple, symmetrical, trachea midline and thyroid not enlarged, symmetric, no tenderness/mass/nodules ?Back: symmetric, no curvature. ROM normal. No CVA tenderness. ?Lungs: clear to auscultation bilaterally ?Heart: regular rate and rhythm, S1, S2 normal, no murmur, click, rub or gallop ?Abdomen: soft, non-tender; bowel sounds normal; no masses,  no organomegaly ?Pulses: 2+ and symmetric ?Skin: Skin color, texture, turgor normal. No rashes or lesions ?Lymph nodes: Cervical, supraclavicular, and axillary nodes normal. ? ?Lab Results  ?Component Value Date  ? HGBA1C 6.4 09/26/2021  ? HGBA1C 5.7 (A) 09/21/2020  ? HGBA1C 5.9 11/03/2019  ? ? ?Lab Results  ?Component Value Date  ? CREATININE 0.99 11/13/2021  ? CREATININE 0.94 10/13/2021  ? CREATININE 0.94 10/12/2021  ? ? ?Lab Results  ?Component Value Date  ? WBC 8.2 11/13/2021  ? HGB 12.3 11/13/2021  ? HCT 40.6 11/13/2021  ? PLT 181 11/13/2021  ? GLUCOSE  115 (H) 11/13/2021  ? CHOL 115 11/14/2021  ? TRIG 91 11/14/2021  ? HDL 42 11/14/2021  ? LDLDIRECT 126.0 09/19/2016  ? [place] 55 11/14/2021  ? ALT 14 11/13/2021  ? AST 21 11/13/2021  ? NA 138 11/13/2021  ? K 4.7 11/13/2021  ? CL 104 11/13/2021  ? CREATININE 0.99 11/13/2021  ? BUN 16 11/13/2021  ? CO2 27 11/13/2021  ? TSH 1.49 09/26/2021  ? INR 1.0 11/13/2021  ? HGBA1C 6.4 09/26/2021  ? MICROALBUR 10.4 (H) 10/10/2021  ? ? ?DG Chest 2 View ? ?Result Date: 11/13/2021 ?CLINICAL DATA:  LEFT arm numbness EXAM:  CHEST - 2 VIEW COMPARISON:  10/12/2021 FINDINGS: Normal mediastinum and cardiac silhouette. Normal pulmonary vasculature. No evidence of effusion, infiltrate, or pneumothorax. No acute bony abnormality. LEFT shoulder prosthetic IMPRESSION: No acute cardiopulmonary process. Electronically Signed   By: Suzy Bouchard M.D.   On: 11/13/2021 10:29  ? ?CT HEAD WO CONTRAST (5MM) ? ?Result Date: 11/14/2021 ?CLINICAL DATA:  Subdural hematoma follow-up EXAM: CT HEAD WITHOUT CONTRAST TECHNIQUE: Contiguous axial images were obtained from the base of the skull through the vertex without intravenous contrast. RADIATION DOSE REDUCTION: This exam was performed according to the departmental dose-optimization program which includes automated exposure control, adjustment of the mA and/or kV according to patient size and/or use of iterative reconstruction technique. COMPARISON:  Head CT and MRI 11/13/2021 FINDINGS: Brain: There is no mass, hemorrhage or extra-axial collection visible. No correlate to the FLAIR abnormality seen on the earlier MRI. There is generalized atrophy without lobar predilection. Hypodensity of the white matter is most commonly associated with chronic microvascular disease. Vascular: No abnormal hyperdensity of the major intracranial arteries or dural venous sinuses. No intracranial atherosclerosis. Skull: The visualized skull base, calvarium and extracranial soft tissues are normal. Sinuses/Orbits: No fluid  levels or advanced mucosal thickening of the visualized paranasal sinuses. No mastoid or middle ear effusion. The orbits are normal. IMPRESSION: 1. No correlate to the FLAIR abnormality seen on the earlier MRI, whic

## 2021-11-23 NOTE — Assessment & Plan Note (Signed)
Continue aggressive BP control and aspirin  ?

## 2021-11-23 NOTE — Assessment & Plan Note (Signed)
Patient is stable post discharge and has no new issues or questions about discharge plans at the visit today for hospital follow up. All labs , imaging studies and progress notes from admission were reviewed with patient today   

## 2021-11-23 NOTE — Assessment & Plan Note (Signed)
RESOLVED continue current doses of amlodipine and fursemide ?

## 2021-11-27 ENCOUNTER — Encounter
Admission: RE | Admit: 2021-11-27 | Discharge: 2021-11-27 | Disposition: A | Discharge status: Home / Self Care | Payer: Medicare Other | Source: Ambulatory Visit | Attending: Physician Assistant | Admitting: Physician Assistant

## 2021-11-27 DIAGNOSIS — R072 Precordial pain: Secondary | ICD-10-CM | POA: Diagnosis not present

## 2021-11-27 LAB — NM MYOCAR MULTI W/SPECT W/WALL MOTION / EF
LV dias vol: 89 mL (ref 46–106)
LV sys vol: 43 mL
Nuc Stress EF: 52 %
Peak HR: 92 {beats}/min
Percent HR: 70 %
Rest HR: 59 {beats}/min
Rest Nuclear Isotope Dose: 10.3 mCi
SDS: 0
SRS: 7
SSS: 0
ST Depression (mm): 0 mm
Stress Nuclear Isotope Dose: 31.9 mCi
TID: 1.22

## 2021-11-27 MED ORDER — REGADENOSON 0.4 MG/5ML IV SOLN
0.4000 mg | Freq: Once | INTRAVENOUS | Status: AC
Start: 2021-11-27 — End: 2021-11-27
  Administered 2021-11-27: 0.4 mg via INTRAVENOUS

## 2021-11-27 MED ORDER — TECHNETIUM TC 99M TETROFOSMIN IV KIT
10.0000 | PACK | Freq: Once | INTRAVENOUS | Status: AC
  Administered 2021-11-27: 10.29 via INTRAVENOUS

## 2021-11-27 MED ORDER — TECHNETIUM TC 99M TETROFOSMIN IV KIT
30.0000 | PACK | Freq: Once | INTRAVENOUS | Status: AC
  Administered 2021-11-27: 31.89 via INTRAVENOUS

## 2021-11-28 DIAGNOSIS — Z20822 Contact with and (suspected) exposure to covid-19: Secondary | ICD-10-CM | POA: Diagnosis not present

## 2021-12-06 ENCOUNTER — Encounter: Payer: Self-pay | Admitting: Ophthalmology

## 2021-12-06 DIAGNOSIS — H02834 Dermatochalasis of left upper eyelid: Secondary | ICD-10-CM | POA: Diagnosis not present

## 2021-12-06 DIAGNOSIS — L574 Cutis laxa senilis: Secondary | ICD-10-CM | POA: Diagnosis not present

## 2021-12-06 DIAGNOSIS — H02831 Dermatochalasis of right upper eyelid: Secondary | ICD-10-CM | POA: Diagnosis not present

## 2021-12-07 ENCOUNTER — Ambulatory Visit (INDEPENDENT_AMBULATORY_CARE_PROVIDER_SITE_OTHER): Payer: Medicare Other

## 2021-12-07 ENCOUNTER — Encounter: Payer: Self-pay | Admitting: Anesthesiology

## 2021-12-07 DIAGNOSIS — I2510 Atherosclerotic heart disease of native coronary artery without angina pectoris: Secondary | ICD-10-CM

## 2021-12-07 DIAGNOSIS — R072 Precordial pain: Secondary | ICD-10-CM | POA: Diagnosis not present

## 2021-12-07 LAB — ECHOCARDIOGRAM LIMITED: S' Lateral: 3.2 cm

## 2021-12-07 NOTE — Anesthesia Preprocedure Evaluation (Deleted)
Anesthesia Evaluation  ? ? ?Airway ? ? ? ? ? ? ? Dental ?  ?Pulmonary ?former smoker,  ?  ? ? ? ? ? ? ? Cardiovascular ?hypertension,  ? ? ?  ?Neuro/Psych ?  ? GI/Hepatic ?  ?Endo/Other  ? ? Renal/GU ?  ? ?  ?Musculoskeletal ? ? Abdominal ?  ?Peds ? Hematology ?  ?Anesthesia Other Findings ? ? Reproductive/Obstetrics ? ?  ? ? ? ? ? ? ? ? ? ? ? ? ? ?  ?  ? ? ? ? ? ? ? ? ?Anesthesia Physical ?Anesthesia Plan ?Anesthesia Quick Evaluation ? ? ?From Cardiology Note 10/2021: ? ?1. Nonobstructive CAD with left jaw pain: Currently asymptomatic.  Schedule Lexiscan MPI to evaluate for high risk ischemia.  She remains on aspirin given prior vascular stenting as outlined below.  Continue rosuvastatin. ?? ?2. HFpEF/possible HOCM: Stop amlodipine.  Start Cardizem CD 180 mg.  She reports an intolerance to metoprolol with significant fatigue.  May need to consider rechallenge of beta-blocker.  Repeat limited echo to evaluate LVOT gradient.  Patient and daughter would prefer conservative approach with medications rather than referral to [facility] for consideration of myomectomy.  This appears reasonable. ?? ?3. HTN: Blood pressure is mildly elevated in the office today, though improved when compared to her hospital readings.  We will transition her from amlodipine to Cardizem CD as outlined above given concern for possible HOCM. ?? ?4. Mitral regurgitation/stenosis: Monitor with periodic echo. ?? ?5. LBBB: Stable and without symptoms of near syncope/syncope. ?? ?6. Carotid artery disease: Status post LICA stenting in 0/2585 with most recent noninvasive imaging from 04/2021 showing 1 to 39% RICA stenosis and 40 to 27% LICA stenosis with a patent LICA stent with no evidence of restenosis.  She remains on aspirin and rosuvastatin.  Followed by vascular surgery. ?? ?7. Left-sided renal artery stenosis: Status post stenting in 12/2018.  Most recent noninvasive imaging from 04/2021 showed no evidence of  RAS bilaterally.  Aspirin and rosuvastatin.  Followed by vascular surgery. ?? ?8. Possible SDH: Evaluated by neurosurgery with no intervention indicated. ? ?Pt had recent admission - will await Lexiscan and Echo and cardiac clearance before proceeding with elective surgery ?

## 2021-12-15 ENCOUNTER — Ambulatory Visit: Admit: 2021-12-15 | Payer: Medicare Other | Admitting: Ophthalmology

## 2021-12-15 SURGERY — BLEPHAROPLASTY
Anesthesia: Monitor Anesthesia Care | Laterality: Bilateral

## 2021-12-19 ENCOUNTER — Encounter: Payer: Self-pay | Admitting: Dermatology

## 2021-12-20 ENCOUNTER — Ambulatory Visit (INDEPENDENT_AMBULATORY_CARE_PROVIDER_SITE_OTHER): Payer: Medicare Other | Admitting: Dermatology

## 2021-12-20 DIAGNOSIS — L821 Other seborrheic keratosis: Secondary | ICD-10-CM | POA: Diagnosis not present

## 2021-12-20 DIAGNOSIS — I701 Atherosclerosis of renal artery: Secondary | ICD-10-CM

## 2021-12-20 DIAGNOSIS — L578 Other skin changes due to chronic exposure to nonionizing radiation: Secondary | ICD-10-CM | POA: Diagnosis not present

## 2021-12-20 DIAGNOSIS — L820 Inflamed seborrheic keratosis: Secondary | ICD-10-CM

## 2021-12-20 NOTE — Progress Notes (Signed)
   Follow-Up Visit   Subjective  Kathryn Wise is a 88 y.o. female who presents for the following: Other (Sore spot on chest). The patient has spots, moles and lesions to be evaluated, some may be new or changing and the patient has concerns that these could be cancer.  The following portions of the chart were reviewed this encounter and updated as appropriate:   Tobacco  Allergies  Meds  Problems  Med Hx  Surg Hx  Fam Hx     Review of Systems:  No other skin or systemic complaints except as noted in HPI or Assessment and Plan.  Objective  Well appearing patient in no apparent distress; mood and affect are within normal limits.  A focused examination was performed including chest, back, left leg. Relevant physical exam findings are noted in the Assessment and Plan.  left pretibial x 1, chest x 2, right post shoulder x 1 (4) Erythematous stuck-on, waxy papule or plaque   Assessment & Plan   Actinic Damage - chronic, secondary to cumulative UV radiation exposure/sun exposure over time - diffuse scaly erythematous macules with underlying dyspigmentation - Recommend daily broad spectrum sunscreen SPF 30+ to sun-exposed areas, reapply every 2 hours as needed.  - Recommend staying in the shade or wearing long sleeves, sun glasses (UVA+UVB protection) and wide brim hats (4-inch brim around the entire circumference of the hat). - Call for new or changing lesions.  Seborrheic Keratoses - Stuck-on, waxy, tan-brown papules and/or plaques  - Benign-appearing - Discussed benign etiology and prognosis. - Observe - Call for any changes  Inflamed seborrheic keratosis (4) left pretibial x 1, chest x 2, right post shoulder x 1  Destruction of lesion - left pretibial x 1, chest x 2, right post shoulder x 1 Complexity: simple   Destruction method: cryotherapy   Informed consent: discussed and consent obtained   Timeout:  patient name, date of birth, surgical site, and procedure  verified Lesion destroyed using liquid nitrogen: Yes   Region frozen until ice ball extended beyond lesion: Yes   Outcome: patient tolerated procedure well with no complications   Post-procedure details: wound care instructions given     Return for Follow up as scheduled.  I, Ashok Cordia, CMA, am acting as scribe for Sarina Ser, MD . Documentation: I have reviewed the above documentation for accuracy and completeness, and I agree with the above.  Sarina Ser, MD

## 2021-12-20 NOTE — Patient Instructions (Signed)

## 2021-12-25 ENCOUNTER — Encounter: Payer: Self-pay | Admitting: Dermatology

## 2021-12-27 NOTE — Progress Notes (Unsigned)
Cardiology Office Note    Date:  12/28/2021   ID:  Kathryn Wise, DOB [birth year], MRN [id number]  PCP:  Kathryn Mc, MD  Cardiologist:  Kathryn Bush, MD  Electrophysiologist:  None   Chief Complaint: Follow-up  History of Present Illness:   Kathryn Wise is a 88 y.o. female with history of nonobstructive CAD, HOCM, HFpEF, difficult to control hypertension with left renal artery stenosis status post stenting in 12/2018, carotid artery disease status post left sided stenting in 09/2020, possible small SDH noted in 10/2021, LBBB, HLD, and mitral regurgitation who presents for follow-up of Lexiscan MPI and echo.   She was previously followed by Dr. Yvone Wise in 2017 for cardiac murmur with echo at that time demonstrating an EF of 60 to 65%, normal wall motion, mild focal basal and mild concentric LVH of the septum with mild LVOT gradient, grade 1 diastolic dysfunction, mildly dilated aortic root and ascending aorta, mild mitral regurgitation, normal RV systolic function and PASP.  She was admitted for chest pain in 05/2017 with Lexiscan being abnormal.  She subsequently underwent LHC in 05/2017, which demonstrated nonobstructive CAD involving the LCx and RCA with medical management recommended.  Echo in 05/2017 showed an EF of 55 to 60%, normal wall motion, mild focal basal and mild concentric LVH, near cavity obliteration in systole, grade 1 diastolic dysfunction, very mild aortic stenosis, moderate mitral regurgitation, mildly dilated left atrium, normal RV systolic function, and a PASP of 50 mmHg.  Following this, she was lost to follow-up.   She was admitted to the hospital in 09/2020 with pneumonia and HFpEF.  Echo demonstrated an EF of 55 to 60%, moderate LVH, grade 2 diastolic dysfunction with moderate LVOT obstruction with a peak gradient of 68 mmHg at rest and 74 mmHg with Valsalva, normal RV systolic function and ventricular cavity size, moderately elevated PASP estimated at 46.5 mmHg,  moderately dilated left atrium, mild mitral regurgitation, mild mitral stenosis, moderate mitral annular calcification, and mild to moderate aortic valve sclerosis without evidence of stenosis.  High-sensitivity troponin trended to 976.  She was lost to follow-up.   She was admitted in 09/2021 with hypertensive urgency with noted improvement in BP following 10 mg of amlodipine and 5 mg of IV labetalol.  She was noted to have a mildly elevated high-sensitivity troponin peaking at 36 felt to be related to supply demand ischemia from hypertension.   She was most recently admitted to the hospital in 10/2021 noting left upper extremity numbness that was transient, bilateral lower extremity swelling, and generalized weakness.  She was consulted on by neurosurgery for possible TIA.  MRI of the brain showed a possible 2 mm subdural hematoma overlying the right cerebral convexity.  This was not well visualized on CT imaging.  She was unaware of any fall/trauma.  There was recommended the patient continue aspirin without need for antiepileptic medication given lack of subdural hematoma noted on CT imaging.  Echo, as read by outside group, showed an EF of 50 to 55%, no regional wall motion normalities, mild concentric LVH, grade 1 diastolic dysfunction, normal RV systolic function and ventricular cavity size, myxomatous mitral valve with mild stenosis, and mild aortic insufficiency.  Lower extremity ultrasound was negative for DVT bilaterally.  High-sensitivity troponin 28 with a delta troponin of 29.  BNP 418.  She was seen in hospital follow-up on 11/20/2021 and was without symptoms of angina or decompensation at that time.  She did report an approximate 4-week history  of left jaw pain, and in the setting had multiple teeth extracted along that side without symptomatic improvement.  This discomfort was exacerbated by exertion, chewing, and yawning.  Subsequent Lexiscan MPI on 11/27/2021 was overall low risk without evidence  of infarction or ischemia.  CT attenuated corrected images showed mild coronary artery calcification and aortic atherosclerosis.  Echo on 12/07/2021 demonstrated an EF of 60 to 65%, no regional wall motion abnormalities, moderate LVH with severe basal septal hypertrophy with an LVOT gradient of 39 mmHg at rest and 52 mmHg with Valsalva with one measurement of 127 mmHg, normal RV systolic function and ventricular cavity size, mildly dilated left atrium, moderate mitral valve regurgitation with severe mitral annular calcification, and aortic valve sclerosis without evidence of stenosis.  She comes in doing well from a cardiac perspective and is without symptoms of angina or decompensation.  She does feel like she is somewhat deconditioned as she has not been able to exercise regularly in the setting of the above.  She plans to resume this moving forward.  No dizziness, presyncope, or syncope.  She does note a long history of left lower extremity swelling that is intermittent with prior work-up being negative for DVT recently.  No significant orthopnea.  She is watching her salt and fluid intake.  She also notes her blood pressure has been improved following the addition of Cardizem CD.   Labs independently reviewed: 10/2021 - TC 115, TG 91, HDL 42, LDL 55, potassium 4.7, BUN 16, serum creatinine 0.99, albumin 4.1, AST/ALT normal, Hgb 12.3, PLT 181 08/2021 - A1c 6.4, TSH normal  Past Medical History:  Diagnosis Date   Arthritis    Collagen vascular disease ([place])    Dysphagia, pharyngoesophageal phase 09/10/2013   Upper GI study with barium swallow was done at  [facility] Mar 2015   No reflux seen  Small irreducible hiatal hernia  Mild changes of presbyeophagus (abnormal contractions of the esophagus that occur with aging) No strictures Normal gastric emptying  Incomplete visualization of stomach fold due to patient's inability turn     GERD (gastroesophageal reflux disease)    Heart murmur    has had years and  years   History of shingles Dec 2013   treated with steroids , post op from shoulder surgery   History of squamous cell carcinoma 05/02/2016   right mid lateral pretibial   Hypertension    Hypothyroidism    Major depressive disorder, single episode 11/05/2015   Osteoarthritis of left shoulder 07/14/2012   Pneumonia 10/21/2020   Squamous cell carcinoma of skin 05/12/2016   R mid lat pretibial - other skin cancers treated by Dr. Sharlett Iles   Squamous cell carcinoma of skin 08/22/2020   left distal medial popliteal - [facility]    Past Surgical History:  Procedure Laterality Date   ARTHOSCOPIC ROTAOR CUFF REPAIR     LEFT  10+  YEARS     CAROTID PTA/STENT INTERVENTION Left 10/19/2020   Procedure: CAROTID PTA/STENT INTERVENTION;  Surgeon: Katha Cabal, MD;  Location: [place] [facility];  Service: Cardiovascular;  Laterality: Left;   CATARACT EXTRACTION W/ INTRAOCULAR LENS IMPLANT     RIGHT EYE   CATARACT EXTRACTION W/PHACO Left 03/28/2016   Procedure: CATARACT EXTRACTION PHACO AND INTRAOCULAR LENS PLACEMENT (IOC);  Surgeon: Leandrew Koyanagi, MD;  Location: [place];  Service: Ophthalmology;  Laterality: Left;  TORIC   FACELIFT     FOOT SURGERY     rt foot   TUMOR REMOVED  JOINT REPLACEMENT Left Dec 2013   shoulder   LEFT HEART CATH AND CORONARY ANGIOGRAPHY N/A 06/12/2017   Procedure: LEFT HEART CATH AND CORONARY ANGIOGRAPHY;  Surgeon: Minna Merritts, MD;  Location: [place] [facility];  Service: Cardiovascular;  Laterality: N/A;   RENAL ANGIOGRAPHY Left 01/07/2019   Procedure: RENAL ANGIOGRAPHY;  Surgeon: Katha Cabal, MD;  Location: [place] [facility];  Service: Cardiovascular;  Laterality: Left;   RENAL ARTERY STENT     SKIN CANCER EXCISION     TOTAL SHOULDER ARTHROPLASTY  07/14/2012   Procedure: TOTAL SHOULDER ARTHROPLASTY;  Surgeon: Johnny Bridge, MD;  Location: [facility];  Service: Orthopedics;  Laterality: Left;    Current Medications: Current Meds   Medication Sig   b complex vitamins capsule Take 1 capsule by mouth daily.   Cholecalciferol (VITAMIN D-3) 1000 units CAPS Take 1,000 Units by mouth daily.   Cyanocobalamin (VITAMIN B 12 PO) Take 1,000 mcg by mouth daily.   Iron, Ferrous Sulfate, 325 (65 Fe) MG TABS Take 325 mg by mouth daily.   ketoconazole (NIZORAL) 2 % cream Apply 1 application. topically at bedtime. Apply to the feet and between the toes every night.   levothyroxine (SYNTHROID) 88 MCG tablet Take 1 tablet (88 mcg total) by mouth at bedtime.   nitroGLYCERIN (NITROSTAT) 0.4 MG SL tablet Place 1 tablet (0.4 mg total) every 5 (five) minutes as needed under the tongue for chest pain.   omeprazole (PRILOSEC) 20 MG capsule Take 1 capsule (20 mg total) by mouth daily as needed (heartburn). TAKE 1 CAPSULE BY MOUTH ONCE EVERY MORNING AS NEEDED   potassium chloride SA (KLOR-CON M) 20 MEQ tablet Take 1 tablet (20 mEq total) by mouth daily.   rosuvastatin (CRESTOR) 10 MG tablet Take 1 tablet (10 mg total) by mouth at bedtime.   temazepam (RESTORIL) 15 MG capsule TAKE 1 CAPSULE BY MOUTH AT BEDTIME AS NEEDED SLEEP   Turmeric 500 MG CAPS Take 500 mg by mouth daily.   [DISCONTINUED] diltiazem (CARDIZEM CD) 180 MG 24 hr capsule Take 1 capsule (180 mg total) by mouth daily.   [DISCONTINUED] furosemide (LASIX) 20 MG tablet Take 1 tablet (20 mg total) by mouth daily.    Allergies:   Alendronate, Codeine, Lisinopril, Metoprolol, Oxycodone, and Oxycontin [oxycodone hcl]   Social History   Socioeconomic History   Marital status: Widowed    Spouse name: Not on file   Number of children: Not on file   Years of education: Not on file   Highest education level: Not on file  Occupational History   Not on file  Tobacco Use   Smoking status: Former    Types: Cigarettes    Quit date: 07/30/1972    Years since quitting: 49.4   Smokeless tobacco: Never   Tobacco comments:    smoked for only a few months  Vaping Use   Vaping Use: Never used   Substance and Sexual Activity   Alcohol use: Yes    Comment: OCCAS WINE    Drug use: No   Sexual activity: Never  Other Topics Concern   Not on file  Social History Narrative   Not on file   Social Determinants of Health   Financial Resource Strain: Not on file  Food Insecurity: Not on file  Transportation Needs: Not on file  Physical Activity: Not on file  Stress: Not on file  Social Connections: Not on file     Family History:  The patient's family  history includes Breast cancer (age of onset: 60) in her daughter; Cancer in her daughter and son.  ROS:   12-point review of systems is negative unless otherwise noted in the HPI.   EKGs/Labs/Other Studies Reviewed:    Studies reviewed were summarized above. The additional studies were reviewed today:  Limited echo 12/07/2021: 1. Left ventricular ejection fraction, by estimation, is 60 to 65%. The  left ventricle has normal function. The left ventricle has no regional  wall motion abnormalities. There is moderat   2. There is moderate left ventricular hypertrophy with severe basal  septal hypertrophy. LVOT gradient noted, 39 mm Hg at rest, up to 52 mm Hg  with valsalva. One measurement estimating gradient at 127 mm Hg.   3. Right ventricular systolic function is normal. The right ventricular  size is normal. Tricuspid regurgitation signal is inadequate for assessing  PA pressure.   4. Left atrial size was mildly dilated.   5. The mitral valve is normal in structure. Moderate mitral valve  regurgitation. Severe mitral annular calcification.   6. The aortic valve is normal in structure. Aortic valve regurgitation is  not visualized. Aortic valve sclerosis/calcification is present, without  any evidence of aortic stenosis.   Comparison(s): 11/13/21 echo measured LVOT Vmax as 156 cm/s. __________  [facility] MPI 11/27/2021:   The study is normal. The study is low risk.   No ST deviation was noted.   LV perfusion is normal.  There is no evidence of ischemia. There is no evidence of infarction.   Left ventricular function is normal. End diastolic cavity size is normal. End systolic cavity size is normal.   Suboptimal study due to GI uptake.   CT attenuation images with mild aortic and coronary calcification. __________  2D echo 11/13/2021:  1. Left ventricular ejection fraction, by estimation, is 50 to 55%. The  left ventricle has low normal function. The left ventricle has no regional  wall motion abnormalities. There is mild concentric left ventricular  hypertrophy. Left ventricular  diastolic parameters are consistent with Grade I diastolic dysfunction  (impaired relaxation).   2. Right ventricular systolic function is normal. The right ventricular  size is normal.   3. The mitral valve is myxomatous. No evidence of mitral valve  regurgitation. Mild mitral stenosis.   4. The aortic valve is normal in structure. Aortic valve regurgitation is  mild. __________   2D echo 06/26/2017: - Left ventricle: The cavity size was normal. Near cavity    obliteration in systole. There was mild focal basal and mild    concentric hypertrophy of the septum. Systolic function was    normal. The estimated ejection fraction was in the range of 55%    to 60%. Wall motion was normal; there were no regional wall    motion abnormalities. Doppler parameters are consistent with    abnormal left ventricular relaxation (grade 1 diastolic    dysfunction).  - Aortic valve: Transvalvular velocity was increased. There was    very mild stenosis.  - Mitral valve: Calcified annulus. There was moderate    regurgitation.  - Left atrium: The atrium was mildly dilated.  - Right ventricle: Systolic function was normal.  - Pulmonary arteries: Systolic pressure was moderately elevated. PA    peak pressure: 50 mm Hg (S). __________  [facility] 06/12/2017: Coronary dominance: Right  Left mainstem:   Large vessel that bifurcates into the LAD and  left circumflex, Ostial 30% disease, calcified.  Left anterior descending (LAD):   Large  vessel that extends to the apical region, diagonal branch 2 of moderate size, mild lumenal irregularities  Left circumflex (LCx):  Large vessel with OM branch 2, moderate 50% mid vessel disease, calcified  Right coronary artery (RCA):  Right dominant vessel with PL and PDA, moderate focal disease, eccentric 50 to 60%  Left ventriculography: Left ventricular systolic function is normal/hyperdynamic, LVEF is estimated at 55-65%, there is no significant mitral regurgitation , no significant aortic valve stenosis  Final Conclusions:   Moderate mid LCX and RCA disease, Medical management recommended Normal/hyperdynamic function  Recommendations:  Follow up on echocardiogram Start metoprolol tartrate 25 mg po BID __________   [facility] MPI 06/11/2017: Abnormal, potentially high risk myocardial perfusion stress test. There is a moderate in size, mild in severity, reversible defect involving the mid inferolateral, apical lateral, and apical segments consistent with ischemia. There is a small in size, mild in severity, reversible defect involving the mid anterior segment, which may represent subtle ischemia and/or shifting breast attenuation. The left ventricular ejection fraction is normal (64%). Transient ischemic dilation is noted (TID 1.31), which is non-specific but can be seen balanced/multivessel ischemia. __________   2D echo 12/05/2015: - Left ventricle: The cavity size was normal. There was mild focal    basal and mild concentric hypertrophy of the septum, with mild    LVOT gradient. Systolic function was normal. The estimated    ejection fraction was in the range of 60% to 65%. Wall motion was    normal; there were no regional wall motion abnormalities. Doppler    parameters are consistent with abnormal left ventricular    relaxation (grade 1 diastolic dysfunction).  - Aorta: Aortic root with  milldy dilated, dimension: 35 mm (ED).    Ascending aorta was mildly dilated, diameter: 35 mm (S).  - Mitral valve: There was mild regurgitation.  - Left atrium: The atrium was normal in size.  - Right ventricle: Systolic function was normal.  - Pulmonary arteries: Systolic pressure was within the normal    range.  - Inferior vena cava: The vessel was normal in size. The    respirophasic diameter changes were in the normal range (>= 50%),    consistent with normal central venous pressure.   Impressions:   - Murmur likely secondary to aortic valve sclerosis without    significant stenosis, and mild LVOT gradient.   EKG:  EKG is ordered today.  The EKG ordered today demonstrates NSR, 65 bpm, LBBB (known)  Recent Labs: 09/26/2021: TSH 1.49 11/13/2021: ALT 14; B Natriuretic Peptide 418.1; BUN 16; Creatinine, Ser 0.99; Hemoglobin 12.3; Platelets 181; Potassium 4.7; Sodium 138  Recent Lipid Panel    Component Value Date/Time   CHOL 115 11/14/2021 0538   TRIG 91 11/14/2021 0538   HDL 42 11/14/2021 0538   CHOLHDL 2.7 11/14/2021 0538   VLDL 18 11/14/2021 0538   LDLCALC 55 11/14/2021 0538   LDLDIRECT 126.0 09/19/2016 1411    PHYSICAL EXAM:    VS:  BP 140/60 (BP Location: Left Arm, Patient Position: Sitting, Cuff Size: Normal)   Pulse 65   Ht '5\' 5"'$  (1.651 m)   Wt 154 lb (69.9 kg)   BMI 25.63 kg/m   BMI: Body mass index is 25.63 kg/m.  Physical Exam Vitals reviewed.  Constitutional:      Appearance: She is well-developed.  HENT:     Head: Normocephalic and atraumatic.  Eyes:     General:        Right eye: No discharge.  Left eye: No discharge.  Neck:     Vascular: No JVD.  Cardiovascular:     Rate and Rhythm: Normal rate and regular rhythm.     Pulses:          Posterior tibial pulses are 2+ on the right side and 2+ on the left side.     Heart sounds: S1 normal and S2 normal. Heart sounds not distant. No midsystolic click and no opening snap. Murmur heard.   Systolic murmur is present. II/VI systolic murmur heard throughout    No friction rub.  Pulmonary:     Effort: Pulmonary effort is normal. No respiratory distress.     Breath sounds: Normal breath sounds. No decreased breath sounds, wheezing or rales.  Chest:     Chest wall: No tenderness.  Abdominal:     General: There is no distension.     Palpations: Abdomen is soft.     Tenderness: There is no abdominal tenderness.  Musculoskeletal:     Cervical back: Normal range of motion.     Comments: Trivial left ankle swelling  Skin:    General: Skin is warm and dry.     Nails: There is no clubbing.  Neurological:     Mental Status: She is alert and oriented to person, place, and time.  Psychiatric:        Speech: Speech normal.        Behavior: Behavior normal.        Thought Content: Thought content normal.        Judgment: Judgment normal.    Wt Readings from Last 3 Encounters:  12/28/21 154 lb (69.9 kg)  11/22/21 153 lb 3.2 oz (69.5 kg)  11/20/21 151 lb 9.6 oz (68.8 kg)     ASSESSMENT & PLAN:   Nonobstructive CAD: She is without symptoms of angina or decompensation.  Recent Lexiscan MPI showed no evidence of ischemia and was overall low risk.  Continue aggressive risk factor modification and current medical therapy.  No indication for further ischemic testing at this time.  HOCM: No symptoms of dizziness, presyncope, or syncope.  Escalate Cardizem CD to 240 mg daily in an effort to achieve a target heart rate in the mid to upper 50s to low 60s bpm.  She is intolerant to beta-blockers secondary to significant fatigue.  She indicates first-degree relatives have previously been screened.  Given advanced age and comorbid conditions, patient and daughter, and patient today have previously preferred a conservative approach rather than referral to HOCM clinic or consideration of myomectomy.  This decision is quite reasonable and I agree with.  For these reasons, we will also defer  referral to EP for consideration of ICD (patient is DNR).  HFpEF: She appears euvolemic and well compensated.  Weight stable by office scale.  She remains on low-dose furosemide.  Optimal blood pressure control is recommended and improved at today's visit.  We did titrate Cardizem as outlined above.  HTN: Blood pressure is mildly elevated in the office today, though improved from prior readings.  Titrate Cardizem CD as outlined above.  Mitral valve regurgitation: Stable on most recent echo when compared to study in 2018.  LBBB: Stable and without symptoms of near syncope or syncope.  Carotid artery disease: Status post LICA stenting in 07/7406 with noninvasive imaging from 04/2021 demonstrating 1 to 39% RICA stenosis and 40 to 14% LICA stenosis with a patent LICA stent with no evidence of restenosis.  She remains on aspirin and rosuvastatin.  Followed by vascular surgery.  Left-sided renal artery stenosis: Status post stenting in 12/2018.  Most recent imaging from 04/2021 showed no evidence of RAS bilaterally.  Followed by vascular surgery.  Possible SDH: Evaluated by neurosurgery with no intervention indicated.   Disposition: F/u with Dr. Saunders Revel or an APP in 4 months.   Medication Adjustments/Labs and Tests Ordered: Current medicines are reviewed at length with the patient today.  Concerns regarding medicines are outlined above. Medication changes, Labs and Tests ordered today are summarized above and listed in the Patient Instructions accessible in Encounters.   Signed, Christell Faith, PA-C 12/28/2021 4:13 PM     [address] [place], [place] [address] [phone]

## 2021-12-28 ENCOUNTER — Encounter: Payer: Self-pay | Admitting: Physician Assistant

## 2021-12-28 ENCOUNTER — Ambulatory Visit (INDEPENDENT_AMBULATORY_CARE_PROVIDER_SITE_OTHER): Payer: Medicare Other | Admitting: Physician Assistant

## 2021-12-28 VITALS — BP 140/60 | HR 65 | Ht 65.0 in | Wt 154.0 lb

## 2021-12-28 DIAGNOSIS — I447 Left bundle-branch block, unspecified: Secondary | ICD-10-CM | POA: Diagnosis not present

## 2021-12-28 DIAGNOSIS — I10 Essential (primary) hypertension: Secondary | ICD-10-CM

## 2021-12-28 DIAGNOSIS — I6522 Occlusion and stenosis of left carotid artery: Secondary | ICD-10-CM

## 2021-12-28 DIAGNOSIS — I5032 Chronic diastolic (congestive) heart failure: Secondary | ICD-10-CM | POA: Diagnosis not present

## 2021-12-28 DIAGNOSIS — I059 Rheumatic mitral valve disease, unspecified: Secondary | ICD-10-CM

## 2021-12-28 DIAGNOSIS — S065XAD Traumatic subdural hemorrhage with loss of consciousness status unknown, subsequent encounter: Secondary | ICD-10-CM

## 2021-12-28 DIAGNOSIS — I701 Atherosclerosis of renal artery: Secondary | ICD-10-CM | POA: Diagnosis not present

## 2021-12-28 DIAGNOSIS — I421 Obstructive hypertrophic cardiomyopathy: Secondary | ICD-10-CM

## 2021-12-28 DIAGNOSIS — S065XAA Traumatic subdural hemorrhage with loss of consciousness status unknown, initial encounter: Secondary | ICD-10-CM

## 2021-12-28 DIAGNOSIS — I2510 Atherosclerotic heart disease of native coronary artery without angina pectoris: Secondary | ICD-10-CM | POA: Diagnosis not present

## 2021-12-28 MED ORDER — DILTIAZEM HCL ER COATED BEADS 240 MG PO CP24: 240.0000 mg | ORAL_CAPSULE | Freq: Every day | ORAL | 1 refills | Status: DC

## 2021-12-28 NOTE — Patient Instructions (Signed)
Medication Instructions:   Your physician has recommended you make the following change in your medication:   INCREASE Cardizem (diltiazem) 240 mg daily - A new Rx has been sent to your pharmacy  *If you need a refill on your cardiac medications before your next appointment, please call your pharmacy*   Lab Work:  None ordered  Testing/Procedures:  None ordered   Follow-Up: At [facility], you and your health needs are our priority.  As part of our continuing mission to provide you with exceptional heart care, we have created designated Provider Care Teams.  These Care Teams include your primary Cardiologist (physician) and Advanced Practice Providers (APPs -  Physician Assistants and Nurse Practitioners) who all work together to provide you with the care you need, when you need it.  We recommend signing up for the patient portal called "MyChart".  Sign up information is provided on this After Visit Summary.  MyChart is used to connect with patients for Virtual Visits (Telemedicine).  Patients are able to view lab/test results, encounter notes, upcoming appointments, etc.  Non-urgent messages can be sent to your provider as well.   To learn more about what you can do with MyChart, go to [email or web address].    Your next appointment:   4 month(s)  The format for your next appointment:   In Person  Provider:   Christell Faith, PA-C{   Important Information About Sugar

## 2022-01-02 ENCOUNTER — Telehealth: Payer: Self-pay | Admitting: Physician Assistant

## 2022-01-02 NOTE — Telephone Encounter (Signed)
Spoke with patient and she reports started medication on Thursday and having pain all over. She does report having polymyalgia and changes can make it act up. On Friday it started affecting her kidneys having incontinence and she has never had this before. Saturday she was having pain all over again and kidneys after lunch got back to normal with her drinking water, water, water. Then Sunday went to church and pain was still a problem then when she had lunch could not swallow and now no appetite. Yesterday she states "all hell broke loose". She went back to the lower dose and took 180 mg today. She states that any slight changes affect her polymyalgia and strongly feels it was due to medication increase. Tried to ease her concerns but to no avail. Then she went on to tell me about her ear from previous interactions with providers not listening to her. She is most confident with Christell Faith PA-C stating that he listens and knows her history. Advised he was out of the office but I would send this to covering provider for review and recommendations. She verbalized understanding with no further questions.   Left ear went about 4 years ago.  Vascular sent her to ear doctor and he laughed at her. She then went to [facility] and if they had treated in the first 4 days then it could have gone away but now there is nothing to do and only other option would be a cochlear implant.

## 2022-01-02 NOTE — Telephone Encounter (Signed)
Pt c/o medication issue:  1. Name of Medication: diltiazem 240 mg  2. How are you currently taking this medication (dosage and times per day)? 1 tablet daily  3. Are you having a reaction (difficulty breathing--STAT)? no  4. What is your medication issue? Patient states she believes the medication is too strong for her. She says her "kidney's are acting up" and she "cannot eat". She says she got up this morning and could not get  BP reading.

## 2022-01-03 NOTE — Telephone Encounter (Signed)
If going up to '240mg'$  on diltiazem caused this reaction, than I think it's reasonable to stay on '180mg'$ , which she previously tolerated.

## 2022-01-03 NOTE — Telephone Encounter (Signed)
Left voicemail message to call back to review recommendations.

## 2022-01-04 MED ORDER — DILTIAZEM HCL ER COATED BEADS 180 MG PO CP24: 180.0000 mg | ORAL_CAPSULE | Freq: Every day | ORAL | 1 refills | Status: DC

## 2022-01-04 NOTE — Telephone Encounter (Signed)
Patient is returning RN's call. Please advise. 

## 2022-01-04 NOTE — Telephone Encounter (Signed)
Spoke w/ pt.  Advised her of Chris's recommendation. She verbalizes understanding and is agreeable.  She reports that she has been cold recently and wearing winter clothes in the house. She reports chills and can't get warm.   She reports that her BP has been running a bit low, so she is unsure if this was the cause, but the number has been running normal for the past couple of days, systolic is back up in the 120s, where she could not even get a reading on her monitor previously.   Pt had been holding her baby aspirin thinking it was making her blood thin and thus making her cold; she will resume taking this.  She will continue to monitor BP and call back if sx do not improve.

## 2022-01-07 DIAGNOSIS — Z03818 Encounter for observation for suspected exposure to other biological agents ruled out: Secondary | ICD-10-CM | POA: Diagnosis not present

## 2022-01-07 DIAGNOSIS — J019 Acute sinusitis, unspecified: Secondary | ICD-10-CM | POA: Diagnosis not present

## 2022-01-08 ENCOUNTER — Telehealth: Payer: Self-pay | Admitting: Physician Assistant

## 2022-01-08 NOTE — Telephone Encounter (Signed)
No answer/No voicemail box set up.  

## 2022-01-08 NOTE — Telephone Encounter (Signed)
Copied from other telephone encounter.   Spoke with patient and reviewed her symptoms which sound related to her current illness and not the medication. She was taking higher dose of diltiazem and developed some intolerance to that higher dose. Recommendations were then to decrease back to previous dose which she tolerated better. She stopped taking it all together due to chills with feeling poorly. She went to Urgent Care and they tested her for multiple things and diagnosed her with sinus infection and prescribed antibiotics. She continues to feel poorly but reports her blood pressure this morning SBP was 174 and so she finally took a dose due to it being high. She inquired where her doctor was and what he thought. Advised that I would send this information to him for review and that if he had any further recommendations then I would call her back and if not we would see her at upcoming appointment.

## 2022-01-08 NOTE — Telephone Encounter (Signed)
Her elevated BP reading this morning may be in the setting of her having not had diltiazem and in the context of her feeling poorly.  If she is agreeable, I would like for her to resume Cardizem CD 180 mg daily and discontinue the higher dose of 240 mg.  Hopefully, as she gets her sinus infection treated, and in the context of resuming a lower dose Cardizem, she will begin to feel better.  We can reassess ongoing management of HOCM in follow-up.

## 2022-01-08 NOTE — Telephone Encounter (Signed)
Reviewed provider recommendations and she was agreeable with plan. She stated she didn't want to change her medication again because it affects her so much. Provided reassurance and she had no further concerns at this time.

## 2022-01-08 NOTE — Telephone Encounter (Signed)
Spoke with patient and reviewed her symptoms which sound related to her current illness and not the medication. She was taking higher dose of diltiazem and developed some intolerance to that higher dose. Recommendations were then to decrease back to previous dose which she tolerated better. She stopped taking it all together due to chills with feeling poorly. She went to Urgent Care and they tested her for multiple things and diagnosed her with sinus infection and prescribed antibiotics. She continues to feel poorly but reports her blood pressure this morning SBP was 174 and so she finally took a dose due to it being high. She inquired where her doctor was and what he thought. Advised that I would send this information to him for review and that if he had any further recommendations then I would call her back and if not we would see her at upcoming appointment.   See other telephone encounter.

## 2022-01-08 NOTE — Telephone Encounter (Signed)
Pt c/o medication issue:  1. Name of Medication:   diltiazem (CARDIZEM CD) 180 MG 24 hr capsule    2. How are you currently taking this medication (dosage and times per day)? Take 1 capsule (180 mg total) by mouth daily.  3. Are you having a reaction (difficulty breathing--STAT)? No  4. What is your medication issue? Pt states that medication is causing her to have chills. Pt states that she went to the Urgent Care over the weekend and was told that she has a Sinus Infection. Pt was put on an ABT and says that she has not taken above medication and wants to make sure that her BP or anything else will become high. Please advise

## 2022-02-06 ENCOUNTER — Ambulatory Visit: Payer: Medicare Other | Admitting: Physician Assistant

## 2022-02-07 ENCOUNTER — Ambulatory Visit (INDEPENDENT_AMBULATORY_CARE_PROVIDER_SITE_OTHER): Payer: Medicare Other | Admitting: Dermatology

## 2022-02-07 DIAGNOSIS — Z1283 Encounter for screening for malignant neoplasm of skin: Secondary | ICD-10-CM | POA: Diagnosis not present

## 2022-02-07 DIAGNOSIS — B353 Tinea pedis: Secondary | ICD-10-CM

## 2022-02-07 DIAGNOSIS — L578 Other skin changes due to chronic exposure to nonionizing radiation: Secondary | ICD-10-CM

## 2022-02-07 DIAGNOSIS — Z85828 Personal history of other malignant neoplasm of skin: Secondary | ICD-10-CM

## 2022-02-07 DIAGNOSIS — D229 Melanocytic nevi, unspecified: Secondary | ICD-10-CM

## 2022-02-07 DIAGNOSIS — L814 Other melanin hyperpigmentation: Secondary | ICD-10-CM

## 2022-02-07 DIAGNOSIS — L609 Nail disorder, unspecified: Secondary | ICD-10-CM

## 2022-02-07 DIAGNOSIS — D1800 Hemangioma unspecified site: Secondary | ICD-10-CM

## 2022-02-07 DIAGNOSIS — I701 Atherosclerosis of renal artery: Secondary | ICD-10-CM | POA: Diagnosis not present

## 2022-02-07 DIAGNOSIS — L820 Inflamed seborrheic keratosis: Secondary | ICD-10-CM | POA: Diagnosis not present

## 2022-02-07 DIAGNOSIS — L821 Other seborrheic keratosis: Secondary | ICD-10-CM

## 2022-02-07 MED ORDER — KETOCONAZOLE 2 % EX CREA: 1.0000 | TOPICAL_CREAM | Freq: Every day | CUTANEOUS | 6 refills | Status: DC

## 2022-02-07 NOTE — Progress Notes (Signed)
Follow-Up Visit   Subjective  Kathryn Wise is a 88 y.o. female who presents for the following: Annual Exam (Mole check ). Hx of SCC The patient presents for Total-Body Skin Exam (TBSE) for skin cancer screening and mole check.  The patient has spots, moles and lesions to be evaluated, some may be new or changing and the patient has concerns that these could be cancer.   The following portions of the chart were reviewed this encounter and updated as appropriate:   Tobacco  Allergies  Meds  Problems  Med Hx  Surg Hx  Fam Hx     Review of Systems:  No other skin or systemic complaints except as noted in HPI or Assessment and Plan.  Objective  Well appearing patient in no apparent distress; mood and affect are within normal limits.  A full examination was performed including scalp, head, eyes, ears, nose, lips, neck, chest, axillae, abdomen, back, buttocks, bilateral upper extremities, bilateral lower extremities, hands, feet, fingers, toes, fingernails, and toenails. All findings within normal limits unless otherwise noted below.  Right Forearm x 2, back x 1 (3) Stuck-on, waxy, tan-brown papule or plaque --Discussed benign etiology and prognosis.   toenails Toenail dystrophy    Assessment & Plan   Lentigines - Scattered tan macules - Due to sun exposure - Benign-appearing, observe - Recommend daily broad spectrum sunscreen SPF 30+ to sun-exposed areas, reapply every 2 hours as needed. - Call for any changes  Seborrheic Keratoses - Stuck-on, waxy, tan-brown papules and/or plaques  - Benign-appearing - Discussed benign etiology and prognosis. - Observe - Call for any changes  Melanocytic Nevi - Tan-brown and/or pink-flesh-colored symmetric macules and papules - Benign appearing on exam today - Observation - Call clinic for new or changing moles - Recommend daily use of broad spectrum spf 30+ sunscreen to sun-exposed areas.   Hemangiomas - Red papules -  Discussed benign nature - Observe - Call for any changes  Actinic Damage - Chronic condition, secondary to cumulative UV/sun exposure - diffuse scaly erythematous macules with underlying dyspigmentation - Recommend daily broad spectrum sunscreen SPF 30+ to sun-exposed areas, reapply every 2 hours as needed.  - Staying in the shade or wearing long sleeves, sun glasses (UVA+UVB protection) and wide brim hats (4-inch brim around the entire circumference of the hat) are also recommended for sun protection.  - Call for new or changing lesions.  History of Squamous Cell Carcinoma of the Skin Multiple see history - No evidence of recurrence today - No lymphadenopathy - Recommend regular full body skin exams - Recommend daily broad spectrum sunscreen SPF 30+ to sun-exposed areas, reapply every 2 hours as needed.  - Call if any new or changing lesions are noted between office visits  Skin cancer screening performed today.   Inflamed seborrheic keratosis (3) Right Forearm x 2, back x 1  Symptomatic, irritating, patient would like treated.   Destruction of lesion - Right Forearm x 2, back x 1 Complexity: simple   Destruction method: cryotherapy   Informed consent: discussed and consent obtained   Timeout:  patient name, date of birth, surgical site, and procedure verified Lesion destroyed using liquid nitrogen: Yes   Region frozen until ice ball extended beyond lesion: Yes   Outcome: patient tolerated procedure well with no complications   Post-procedure details: wound care instructions given    Nail problem Tinea pedis of both feet toenails Chronic and persistent condition with duration or expected duration over one year. Condition  is symptomatic / bothersome to patient. Not to goal.  Improving on Ketoconazole cream  Continue Ketoconazole cream   Related Medications ketoconazole (NIZORAL) 2 % cream Apply 1 Application topically at bedtime. Apply to the feet and between the toes  every night.  Return in about 1 year (around 02/08/2023) for hx of SCC.  IMarye Round, CMA, am acting as scribe for Sarina Ser, MD .  Documentation: I have reviewed the above documentation for accuracy and completeness, and I agree with the above.  Sarina Ser, MD

## 2022-02-07 NOTE — Patient Instructions (Addendum)
Cryotherapy Aftercare  Wash gently with soap and water everyday.   Apply Vaseline and Band-Aid daily until healed.     Due to recent changes in healthcare laws, you may see results of your pathology and/or laboratory studies on MyChart before the doctors have had a chance to review them. We understand that in some cases there may be results that are confusing or concerning to you. Please understand that not all results are received at the same time and often the doctors may need to interpret multiple results in order to provide you with the best plan of care or course of treatment. Therefore, we ask that you please give us 2 business days to thoroughly review all your results before contacting the office for clarification. Should we see a critical lab result, you will be contacted sooner.   If You Need Anything After Your Visit  If you have any questions or concerns for your doctor, please call our main line at [phone] and press option 4 to reach your doctor's medical assistant. If no one answers, please leave a voicemail as directed and we will return your call as soon as possible. Messages left after 4 pm will be answered the following business day.   You may also send us a message via MyChart. We typically respond to MyChart messages within 1-2 business days.  For prescription refills, please ask your pharmacy to contact our office. Our fax number is [phone].  If you have an urgent issue when the clinic is closed that cannot wait until the next business day, you can page your doctor at the number below.    Please note that while we do our best to be available for urgent issues outside of office hours, we are not available 24/7.   If you have an urgent issue and are unable to reach us, you may choose to seek medical care at your doctor's office, retail clinic, urgent care center, or emergency room.  If you have a medical emergency, please immediately call 911 or go to the  emergency department.  Pager Numbers  - Dr. Kowalski: [phone]  - Dr. Moye: [phone]  - Dr. Stewart: [phone]  In the event of inclement weather, please call our main line at [phone] for an update on the status of any delays or closures.  Dermatology Medication Tips: Please keep the boxes that topical medications come in in order to help keep track of the instructions about where and how to use these. Pharmacies typically print the medication instructions only on the boxes and not directly on the medication tubes.   If your medication is too expensive, please contact our office at [phone] option 4 or send us a message through MyChart.   We are unable to tell what your co-pay for medications will be in advance as this is different depending on your insurance coverage. However, we may be able to find a substitute medication at lower cost or fill out paperwork to get insurance to cover a needed medication.   If a prior authorization is required to get your medication covered by your insurance company, please allow us 1-2 business days to complete this process.  Drug prices often vary depending on where the prescription is filled and some pharmacies may offer cheaper prices.  The website www.goodrx.com contains coupons for medications through different pharmacies. The prices here do not account for what the cost may be with help from insurance (it may be cheaper with your insurance), but the website can   give you the price if you did not use any insurance.  - You can print the associated coupon and take it with your prescription to the pharmacy.  - You may also stop by our office during regular business hours and pick up a GoodRx coupon card.  - If you need your prescription sent electronically to a different pharmacy, notify our office through [facility] MyChart or by phone at [phone] option 4.     Si Usted Necesita Algo Despus de Su Visita  Tambin puede  enviarnos un mensaje a travs de MyChart. Por lo general respondemos a los mensajes de MyChart en el transcurso de 1 a 2 das hbiles.  Para renovar recetas, por favor pida a su farmacia que se ponga en contacto con nuestra oficina. Nuestro nmero de fax es el [phone].  Si tiene un asunto urgente cuando la clnica est cerrada y que no puede esperar hasta el siguiente da hbil, puede llamar/localizar a su doctor(a) al nmero que aparece a continuacin.   Por favor, tenga en cuenta que aunque hacemos todo lo posible para estar disponibles para asuntos urgentes fuera del horario de oficina, no estamos disponibles las 24 horas del da, los 7 das de la semana.   Si tiene un problema urgente y no puede comunicarse con nosotros, puede optar por buscar atencin mdica  en el consultorio de su doctor(a), en una clnica privada, en un centro de atencin urgente o en una sala de emergencias.  Si tiene una emergencia mdica, por favor llame inmediatamente al 911 o vaya a la sala de emergencias.  Nmeros de bper  - Dr. Kowalski: [phone]  - Dra. Moye: [phone]  - Dra. Stewart: [phone]  En caso de inclemencias del tiempo, por favor llame a nuestra lnea principal al [phone] para una actualizacin sobre el estado de cualquier retraso o cierre.  Consejos para la medicacin en dermatologa: Por favor, guarde las cajas en las que vienen los medicamentos de uso tpico para ayudarle a seguir las instrucciones sobre dnde y cmo usarlos. Las farmacias generalmente imprimen las instrucciones del medicamento slo en las cajas y no directamente en los tubos del medicamento.   Si su medicamento es muy caro, por favor, pngase en contacto con nuestra oficina llamando al [phone] y presione la opcin 4 o envenos un mensaje a travs de MyChart.   No podemos decirle cul ser su copago por los medicamentos por adelantado ya que esto es diferente dependiendo de la cobertura de su seguro.  Sin embargo, es posible que podamos encontrar un medicamento sustituto a menor costo o llenar un formulario para que el seguro cubra el medicamento que se considera necesario.   Si se requiere una autorizacin previa para que su compaa de seguros cubra su medicamento, por favor permtanos de 1 a 2 das hbiles para completar este proceso.  Los precios de los medicamentos varan con frecuencia dependiendo del lugar de dnde se surte la receta y alguna farmacias pueden ofrecer precios ms baratos.  El sitio web www.goodrx.com tiene cupones para medicamentos de diferentes farmacias. Los precios aqu no tienen en cuenta lo que podra costar con la ayuda del seguro (puede ser ms barato con su seguro), pero el sitio web puede darle el precio si no utiliz ningn seguro.  - Puede imprimir el cupn correspondiente y llevarlo con su receta a la farmacia.  - Tambin puede pasar por nuestra oficina durante el horario de atencin regular y recoger una tarjeta de cupones de GoodRx.  -   Si necesita que su receta se enve electrnicamente a una farmacia diferente, informe a nuestra oficina a travs de MyChart de [place] o por telfono llamando al [phone] y presione la opcin 4.  

## 2022-02-13 NOTE — Progress Notes (Signed)
Cardiology Office Note    Date:  02/16/2022   ID:  Kathryn Wise, DOB 06-27-1930, MRN [id number]  PCP:  [name], MD  Cardiologist:  Nelva Bush, MD  Electrophysiologist:  None   Chief Complaint: Follow-up  History of Present Illness:   Kathryn Wise is a 88 y.o. female with history of nonobstructive CAD, HOCM, HFpEF, difficult to control hypertension with left renal artery stenosis status post stenting in 12/2018, carotid artery disease status post left sided stenting in 09/2020, possible small SDH noted in 10/2021, LBBB, HLD, and mitral regurgitation who presents for follow-up of nonobstructive CAD, HOCM, and HFpEF.   She was previously followed by Dr. Yvone Wise in 2017 for cardiac murmur with echo at that time demonstrating an EF of 60 to 65%, normal wall motion, mild focal basal and mild concentric LVH of the septum with mild LVOT gradient, grade 1 diastolic dysfunction, mildly dilated aortic root and ascending aorta, mild mitral regurgitation, normal RV systolic function and PASP.  She was admitted for chest pain in 05/2017 with Lexiscan being abnormal.  She subsequently underwent LHC in 05/2017, which demonstrated nonobstructive CAD involving the LCx and RCA with medical management recommended.  Echo in 05/2017 showed an EF of 55 to 60%, normal wall motion, mild focal basal and mild concentric LVH, near cavity obliteration in systole, grade 1 diastolic dysfunction, very mild aortic stenosis, moderate mitral regurgitation, mildly dilated left atrium, normal RV systolic function, and a PASP of 50 mmHg.  Following this, she was lost to follow-up.   She was admitted to the hospital in 09/2020 with pneumonia and HFpEF.  Echo demonstrated an EF of 55 to 60%, moderate LVH, grade 2 diastolic dysfunction with moderate LVOT obstruction with a peak gradient of 68 mmHg at rest and 74 mmHg with Valsalva, normal RV systolic function and ventricular cavity size, moderately elevated PASP estimated at  46.5 mmHg, moderately dilated left atrium, mild mitral regurgitation, mild mitral stenosis, moderate mitral annular calcification, and mild to moderate aortic valve sclerosis without evidence of stenosis.  High-sensitivity troponin trended to 976.  She was lost to follow-up.   She was admitted in 09/2021 with hypertensive urgency with noted improvement in BP following 10 mg of amlodipine and 5 mg of IV labetalol.  She was noted to have a mildly elevated high-sensitivity troponin peaking at 36 felt to be related to supply demand ischemia from hypertension.   She was most recently admitted to the hospital in 10/2021 noting left upper extremity numbness that was transient, bilateral lower extremity swelling, and generalized weakness.  She was consulted on by neurosurgery for possible TIA.  MRI of the brain showed a possible 2 mm subdural hematoma overlying the right cerebral convexity.  This was not well visualized on CT imaging.  She was unaware of any fall/trauma.  There was recommended the patient continue aspirin without need for antiepileptic medication given lack of subdural hematoma noted on CT imaging.  Echo, as read by outside group, showed an EF of 50 to 55%, no regional wall motion normalities, mild concentric LVH, grade 1 diastolic dysfunction, normal RV systolic function and ventricular cavity size, myxomatous mitral valve with mild stenosis, and mild aortic insufficiency.  Lower extremity ultrasound was negative for DVT bilaterally.  High-sensitivity troponin 28 with a delta troponin of 29.  BNP 418.   She was seen in hospital follow-up on 11/20/2021 and was without symptoms of angina or decompensation at that time.  She did report an approximate  4-week history of left jaw pain, and in the setting had multiple teeth extracted along that side without symptomatic improvement.  This discomfort was exacerbated by exertion, chewing, and yawning.  Subsequent Lexiscan MPI on 11/27/2021 was overall low risk  without evidence of infarction or ischemia.  CT attenuated corrected images showed mild coronary artery calcification and aortic atherosclerosis.  Echo on 12/07/2021 demonstrated an EF of 60 to 65%, no regional wall motion abnormalities, moderate LVH with severe basal septal hypertrophy with an LVOT gradient of 39 mmHg at rest and 52 mmHg with Valsalva with one measurement of 127 mmHg, normal RV systolic function and ventricular cavity size, mildly dilated left atrium, moderate mitral valve regurgitation with severe mitral annular calcification, and aortic valve sclerosis without evidence of stenosis.  She was last seen in the office on 12/28/2021 and was without symptoms of angina or decompensation.  She did feel somewhat deconditioned as she had not been able to exercise regularly.  She was without symptoms of dizziness, presyncope, or syncope.  Her blood pressure was improved following the addition of Cardizem CD.  We subsequently titrated this to 240 mg daily.  She subsequently reported issues with pain all over, noting that slight "changes" led to a flareup of polymyalgia.  Given this, it was recommended she resume lower dose Cardizem 180 mg daily.  She subsequently discontinued this medication altogether indicating she was "feeling poorly."  She was later diagnosed with a sinus infection.  We recommended she resume Cardizem 180 mg daily due to elevated BP.   She comes in today noting diffuse myalgias and limited mobility while on diltiazem.  She did undergo intermittent washing out of diltiazem and noted improvement in symptoms while she was off of diltiazem.  While off diltiazem, she was able to return back to her water aerobics.  She would take diltiazem if her blood pressure was elevated, and with this would note return of myalgias.  She does request a prescription for prednisone today due to her myalgias.  She indicates medications have done this to her in the past.  No dizziness, presyncope, or syncope.   No symptoms concerning for angina or decompensation.   Labs independently reviewed: 10/2021 - TC 115, TG 91, HDL 42, LDL 55, potassium 4.7, BUN 16, serum creatinine 0.99, albumin 4.1, AST/ALT normal, Hgb 12.3, PLT 181 08/2021 - A1c 6.4, TSH normal  Past Medical History:  Diagnosis Date   Arthritis    Collagen vascular disease ([place])    Dysphagia, pharyngoesophageal phase 09/10/2013   Upper GI study with barium swallow was done at  [facility] Mar 2015   No reflux seen  Small irreducible hiatal hernia  Mild changes of presbyeophagus (abnormal contractions of the esophagus that occur with aging) No strictures Normal gastric emptying  Incomplete visualization of stomach fold due to patient's inability turn     GERD (gastroesophageal reflux disease)    Heart murmur    has had years and years   History of shingles Dec 2013   treated with steroids , post op from shoulder surgery   History of squamous cell carcinoma 05/02/2016   right mid lateral pretibial   Hypertension    Hypothyroidism    Major depressive disorder, single episode 11/05/2015   Osteoarthritis of left shoulder 07/14/2012   Pneumonia 10/21/2020   Squamous cell carcinoma of skin 05/12/2016   R mid lat pretibial - other skin cancers treated by Dr. Sharlett Iles   Squamous cell carcinoma of skin 08/22/2020   left distal  medial popliteal - [facility]    Past Surgical History:  Procedure Laterality Date   ARTHOSCOPIC ROTAOR CUFF REPAIR     LEFT  10+  YEARS     CAROTID PTA/STENT INTERVENTION Left 10/19/2020   Procedure: CAROTID PTA/STENT INTERVENTION;  Surgeon: Katha Cabal, MD;  Location: [facility];  Service: Cardiovascular;  Laterality: Left;   CATARACT EXTRACTION W/ INTRAOCULAR LENS IMPLANT     RIGHT EYE   CATARACT EXTRACTION W/PHACO Left 03/28/2016   Procedure: CATARACT EXTRACTION PHACO AND INTRAOCULAR LENS PLACEMENT (IOC);  Surgeon: Leandrew Koyanagi, MD;  Location: [place];  Service: Ophthalmology;  Laterality:  Left;  TORIC   FACELIFT     FOOT SURGERY     rt foot   TUMOR REMOVED   JOINT REPLACEMENT Left Dec 2013   shoulder   LEFT HEART CATH AND CORONARY ANGIOGRAPHY N/A 06/12/2017   Procedure: LEFT HEART CATH AND CORONARY ANGIOGRAPHY;  Surgeon: Minna Merritts, MD;  Location: [facility];  Service: Cardiovascular;  Laterality: N/A;   RENAL ANGIOGRAPHY Left 01/07/2019   Procedure: RENAL ANGIOGRAPHY;  Surgeon: Katha Cabal, MD;  Location: [facility];  Service: Cardiovascular;  Laterality: Left;   RENAL ARTERY STENT     SKIN CANCER EXCISION     TOTAL SHOULDER ARTHROPLASTY  07/14/2012   Procedure: TOTAL SHOULDER ARTHROPLASTY;  Surgeon: Johnny Bridge, MD;  Location: [facility];  Service: Orthopedics;  Laterality: Left;    Current Medications: Current Meds  Medication Sig   aspirin EC 81 MG tablet Take 81 mg by mouth daily.   b complex vitamins capsule Take 1 capsule by mouth daily.   carvedilol (COREG) 3.125 MG tablet Take 1 tablet (3.125 mg total) by mouth 2 (two) times daily.   Cholecalciferol (VITAMIN D-3) 1000 units CAPS Take 1,000 Units by mouth daily.   Cyanocobalamin (VITAMIN B 12 PO) Take 1,000 mcg by mouth daily.   Iron, Ferrous Sulfate, 325 (65 Fe) MG TABS Take 325 mg by mouth daily.   ketoconazole (NIZORAL) 2 % cream Apply 1 Application topically at bedtime. Apply to the feet and between the toes every night.   levothyroxine (SYNTHROID) 88 MCG tablet Take 1 tablet (88 mcg total) by mouth at bedtime.   nitroGLYCERIN (NITROSTAT) 0.4 MG SL tablet Place 1 tablet (0.4 mg total) every 5 (five) minutes as needed under the tongue for chest pain.   omeprazole (PRILOSEC) 20 MG capsule Take 1 capsule (20 mg total) by mouth daily as needed (heartburn). TAKE 1 CAPSULE BY MOUTH ONCE EVERY MORNING AS NEEDED   potassium chloride SA (KLOR-CON M) 20 MEQ tablet Take 1 tablet (20 mEq total) by mouth daily.   predniSONE (STERAPRED UNI-PAK 48 TAB) 10 MG (48) TBPK tablet Take 5 tablets  (50 mg total) by mouth daily for 1 day, THEN 4 tablets (40 mg total) daily for 1 day, THEN 3 tablets (30 mg total) daily for 1 day, THEN 2 tablets (20 mg total) daily for 1 day, THEN 1 tablet (10 mg total) daily for 1 day.   rosuvastatin (CRESTOR) 10 MG tablet Take 1 tablet (10 mg total) by mouth at bedtime.   Turmeric 500 MG CAPS Take 500 mg by mouth daily.   [DISCONTINUED] diltiazem (CARDIZEM CD) 180 MG 24 hr capsule Take 1 capsule (180 mg total) by mouth daily.    Allergies:   Alendronate, Codeine, Lisinopril, Metoprolol, Oxycodone, and Oxycontin [oxycodone hcl]   Social History   Socioeconomic History  Marital status: Widowed    Spouse name: Not on file   Number of children: Not on file   Years of education: Not on file   Highest education level: Not on file  Occupational History   Not on file  Tobacco Use   Smoking status: Former    Types: Cigarettes    Quit date: 07/30/1972    Years since quitting: 49.5   Smokeless tobacco: Never   Tobacco comments:    smoked for only a few months  Vaping Use   Vaping Use: Never used  Substance and Sexual Activity   Alcohol use: Yes    Comment: OCCAS WINE    Drug use: No   Sexual activity: Never  Other Topics Concern   Not on file  Social History Narrative   Not on file   Social Determinants of Health   Financial Resource Strain: Low Risk  (04/20/2020)   Overall Financial Resource Strain (CARDIA)    Difficulty of Paying Living Expenses: Not hard at all  Food Insecurity: No Food Insecurity (04/20/2020)   Hunger Vital Sign    Worried About Running Out of Food in the Last Year: Never true    [place] in the Last Year: Never true  Transportation Needs: No Transportation Needs (04/20/2020)   PRAPARE - Hydrologist (Medical): No    Lack of Transportation (Non-Medical): No  Physical Activity: Sufficiently Active (04/20/2020)   Exercise Vital Sign    Days of Exercise per Week: 4 days    Minutes of  Exercise per Session: 60 min  Stress: No Stress Concern Present (04/20/2020)   [place]    Feeling of Stress : Not at all  Social Connections: Unknown (04/20/2020)   Social Connection and Isolation Panel [NHANES]    Frequency of Communication with Friends and Family: More than three times a week    Frequency of Social Gatherings with Friends and Family: Not on file    Attends Religious Services: Not on file    Active Member of Clubs or Organizations: Not on file    Attends Archivist Meetings: Not on file    Marital Status: Not on file     Family History:  The patient's family history includes Breast cancer (age of onset: 41) in her daughter; Cancer in her daughter and son.  ROS:   12-point review of systems is negative unless otherwise noted in the HPI.   EKGs/Labs/Other Studies Reviewed:    Studies reviewed were summarized above. The additional studies were reviewed today:  Limited echo 12/07/2021: 1. Left ventricular ejection fraction, by estimation, is 60 to 65%. The  left ventricle has normal function. The left ventricle has no regional  wall motion abnormalities. There is moderat   2. There is moderate left ventricular hypertrophy with severe basal  septal hypertrophy. LVOT gradient noted, 39 mm Hg at rest, up to 52 mm Hg  with valsalva. One measurement estimating gradient at 127 mm Hg.   3. Right ventricular systolic function is normal. The right ventricular  size is normal. Tricuspid regurgitation signal is inadequate for assessing  PA pressure.   4. Left atrial size was mildly dilated.   5. The mitral valve is normal in structure. Moderate mitral valve  regurgitation. Severe mitral annular calcification.   6. The aortic valve is normal in structure. Aortic valve regurgitation is  not visualized. Aortic valve sclerosis/calcification is present, without  any evidence of aortic stenosis.    Comparison(s): 11/13/21 echo measured LVOT Vmax as 156 cm/s. __________   [facility] MPI 11/27/2021:   The study is normal. The study is low risk.   No ST deviation was noted.   LV perfusion is normal. There is no evidence of ischemia. There is no evidence of infarction.   Left ventricular function is normal. End diastolic cavity size is normal. End systolic cavity size is normal.   Suboptimal study due to GI uptake.   CT attenuation images with mild aortic and coronary calcification. __________   2D echo 11/13/2021:  1. Left ventricular ejection fraction, by estimation, is 50 to 55%. The  left ventricle has low normal function. The left ventricle has no regional  wall motion abnormalities. There is mild concentric left ventricular  hypertrophy. Left ventricular  diastolic parameters are consistent with Grade I diastolic dysfunction  (impaired relaxation).   2. Right ventricular systolic function is normal. The right ventricular  size is normal.   3. The mitral valve is myxomatous. No evidence of mitral valve  regurgitation. Mild mitral stenosis.   4. The aortic valve is normal in structure. Aortic valve regurgitation is  mild. __________   2D echo 06/26/2017: - Left ventricle: The cavity size was normal. Near cavity    obliteration in systole. There was mild focal basal and mild    concentric hypertrophy of the septum. Systolic function was    normal. The estimated ejection fraction was in the range of 55%    to 60%. Wall motion was normal; there were no regional wall    motion abnormalities. Doppler parameters are consistent with    abnormal left ventricular relaxation (grade 1 diastolic    dysfunction).  - Aortic valve: Transvalvular velocity was increased. There was    very mild stenosis.  - Mitral valve: Calcified annulus. There was moderate    regurgitation.  - Left atrium: The atrium was mildly dilated.  - Right ventricle: Systolic function was normal.  - Pulmonary  arteries: Systolic pressure was moderately elevated. PA    peak pressure: 50 mm Hg (S). __________  [facility] 06/12/2017: Coronary dominance: Right  Left mainstem:   Large vessel that bifurcates into the LAD and left circumflex, Ostial 30% disease, calcified.  Left anterior descending (LAD):   Large vessel that extends to the apical region, diagonal branch 2 of moderate size, mild lumenal irregularities  Left circumflex (LCx):  Large vessel with OM branch 2, moderate 50% mid vessel disease, calcified  Right coronary artery (RCA):  Right dominant vessel with PL and PDA, moderate focal disease, eccentric 50 to 60%  Left ventriculography: Left ventricular systolic function is normal/hyperdynamic, LVEF is estimated at 55-65%, there is no significant mitral regurgitation , no significant aortic valve stenosis  Final Conclusions:   Moderate mid LCX and RCA disease, Medical management recommended Normal/hyperdynamic function  Recommendations:  Follow up on echocardiogram Start metoprolol tartrate 25 mg po BID __________   [facility] Adam MPI 06/11/2017: Abnormal, potentially high risk myocardial perfusion stress test. There is a moderate in size, mild in severity, reversible defect involving the mid inferolateral, apical lateral, and apical segments consistent with ischemia. There is a small in size, mild in severity, reversible defect involving the mid anterior segment, which may represent subtle ischemia and/or shifting breast attenuation. The left ventricular ejection fraction is normal (64%). Transient ischemic dilation is noted (TID 1.31), which is non-specific but can be seen balanced/multivessel ischemia. __________   2D echo 12/05/2015: -  Left ventricle: The cavity size was normal. There was mild focal    basal and mild concentric hypertrophy of the septum, with mild    LVOT gradient. Systolic function was normal. The estimated    ejection fraction was in the range of 60% to 65%. Wall  motion was    normal; there were no regional wall motion abnormalities. Doppler    parameters are consistent with abnormal left ventricular    relaxation (grade 1 diastolic dysfunction).  - Aorta: Aortic root with milldy dilated, dimension: 35 mm (ED).    Ascending aorta was mildly dilated, diameter: 35 mm (S).  - Mitral valve: There was mild regurgitation.  - Left atrium: The atrium was normal in size.  - Right ventricle: Systolic function was normal.  - Pulmonary arteries: Systolic pressure was within the normal    range.  - Inferior vena cava: The vessel was normal in size. The    respirophasic diameter changes were in the normal range (>= 50%),    consistent with normal central venous pressure.   Impressions:   - Murmur likely secondary to aortic valve sclerosis without    significant stenosis, and mild LVOT gradient.   EKG:  EKG is ordered today.  The EKG ordered today demonstrates NSR, 74 bpm, LBBB (known)  Recent Labs: 09/26/2021: TSH 1.49 11/13/2021: ALT 14; B Natriuretic Peptide 418.1; BUN 16; Creatinine, Ser 0.99; Hemoglobin 12.3; Platelets 181; Potassium 4.7; Sodium 138  Recent Lipid Panel    Component Value Date/Time   CHOL 115 11/14/2021 0538   TRIG 91 11/14/2021 0538   HDL 42 11/14/2021 0538   CHOLHDL 2.7 11/14/2021 0538   VLDL 18 11/14/2021 0538   LDLCALC 55 11/14/2021 0538   LDLDIRECT 126.0 09/19/2016 1411    PHYSICAL EXAM:    VS:  BP (!) 178/80 (BP Location: Right Arm, Patient Position: Sitting, Cuff Size: Normal)   Pulse 74   Ht '5\' 5"'$  (1.651 m)   Wt 151 lb 6.4 oz (68.7 kg)   SpO2 95%   BMI 25.19 kg/m   BMI: Body mass index is 25.19 kg/m.  Physical Exam Vitals reviewed.  Constitutional:      Appearance: She is well-developed.  HENT:     Head: Normocephalic and atraumatic.  Eyes:     General:        Right eye: No discharge.        Left eye: No discharge.  Neck:     Vascular: No JVD.  Cardiovascular:     Rate and Rhythm: Normal rate and  regular rhythm.     Heart sounds: S1 normal and S2 normal. Heart sounds not distant. No midsystolic click and no opening snap. Murmur heard.     Systolic murmur is present with a grade of 2/6.     No friction rub.  Pulmonary:     Effort: Pulmonary effort is normal. No respiratory distress.     Breath sounds: Normal breath sounds. No decreased breath sounds, wheezing or rales.  Chest:     Chest wall: No tenderness.  Abdominal:     General: There is no distension.     Palpations: Abdomen is soft.     Tenderness: There is no abdominal tenderness.  Musculoskeletal:     Cervical back: Normal range of motion.  Skin:    General: Skin is warm and dry.     Nails: There is no clubbing.  Neurological:     Mental Status: She is alert and oriented to person,  place, and time.  Psychiatric:        Speech: Speech normal.        Behavior: Behavior normal.        Thought Content: Thought content normal.        Judgment: Judgment normal.     Wt Readings from Last 3 Encounters:  02/16/22 151 lb 6.4 oz (68.7 kg)  12/28/21 154 lb (69.9 kg)  11/22/21 153 lb 3.2 oz (69.5 kg)     ASSESSMENT & PLAN:   Nonobstructive CAD: She continues to do well and is without symptoms of angina or decompensation.  Recent Lexiscan MPI showed no evidence of ischemia and was overall low risk.  Continue aggressive risk factor modification and current medical therapy.  No indication for further ischemic testing at this time.  HOCM: No symptoms of dizziness, presyncope, or syncope.  She has not tolerated the titration/continuation of diltiazem secondary to myalgias.  Given this, we will discontinue diltiazem.  She has noted intolerance to metoprolol secondary to lethargy.  We will rechallenge her with carvedilol 3.125 mg twice daily.  She indicates first-degree relatives have previously been screened.  Given advanced age and comorbid conditions, patient has previously preferred a conservative approach rather than referral to  [place] [facility] or consideration of myomectomy.  This decision is quite reasonable and I agree with.  For these reasons, we will also defer referral to EP for consideration of ICD (patient is DNR), and cardiac MRI.  Myalgias: She feels this is attributed to diltiazem.  We will discontinue the medication as outlined above.  We will send in a 5-day prednisone taper at her request.  Check BMP and CK.  If symptoms persist despite discontinuation of diltiazem, though based on her history they have subsequently improved off medication, would recommend she follow-up with PCP.  HFpEF: She appears euvolemic and well compensated.  Her weight is down 3 pounds by our scale today.  She remains on low-dose furosemide.  Optimal blood pressure control is recommended.  Check BMP.  HTN: Blood pressure is elevated in the office today, though she did not sleep well last night as she was worried about oversleeping for her appointment today.  We are transitioning from Cardizem CD to carvedilol as outlined above.  Mitral valve regurgitation: Stable on most recent echo when compared to study in 2018.  LBBB: Stable and without symptoms of near syncope/syncope.  Carotid artery disease: Status post LICA stenting in 10/9824 with noninvasive imaging from 04/2021 demonstrating 1 to 39% RICA stenosis and 40 to 41% LICA stenosis with a patent LICA stent with no evidence of restenosis.  She remains on aspirin and rosuvastatin.  Followed by vascular surgery.  Left-sided renal artery stenosis: Status post stenting in 12/2020.  Most recent imaging from 04/2021 showed no evidence of RAS bilaterally.  Followed by vascular surgery.  Possible SDH: Evaluated by neurosurgery with no intervention indicated.   Disposition: F/u with Dr. Saunders Revel or an APP in 3 months.   Medication Adjustments/Labs and Tests Ordered: Current medicines are reviewed at length with the patient today.  Concerns regarding medicines are outlined above. Medication changes,  Labs and Tests ordered today are summarized above and listed in the Patient Instructions accessible in Encounters.   Signed, Christell Faith, PA-C 02/16/2022 9:16 AM     [place] [address] [place], [place] [id number] [phone]

## 2022-02-14 ENCOUNTER — Encounter: Payer: Self-pay | Admitting: Dermatology

## 2022-02-16 ENCOUNTER — Other Ambulatory Visit: Payer: Self-pay | Admitting: *Deleted

## 2022-02-16 ENCOUNTER — Ambulatory Visit (INDEPENDENT_AMBULATORY_CARE_PROVIDER_SITE_OTHER): Payer: Medicare Other | Admitting: Physician Assistant

## 2022-02-16 ENCOUNTER — Encounter: Payer: Self-pay | Admitting: Physician Assistant

## 2022-02-16 ENCOUNTER — Other Ambulatory Visit
Admission: RE | Admit: 2022-02-16 | Discharge: 2022-02-16 | Disposition: A | Discharge status: Home / Self Care | Payer: Medicare Other | Source: Ambulatory Visit | Attending: Physician Assistant | Admitting: Physician Assistant

## 2022-02-16 VITALS — BP 178/80 | HR 74 | Ht 65.0 in | Wt 151.4 lb

## 2022-02-16 DIAGNOSIS — M791 Myalgia, unspecified site: Secondary | ICD-10-CM

## 2022-02-16 DIAGNOSIS — I6522 Occlusion and stenosis of left carotid artery: Secondary | ICD-10-CM

## 2022-02-16 DIAGNOSIS — I059 Rheumatic mitral valve disease, unspecified: Secondary | ICD-10-CM

## 2022-02-16 DIAGNOSIS — I421 Obstructive hypertrophic cardiomyopathy: Secondary | ICD-10-CM | POA: Insufficient documentation

## 2022-02-16 DIAGNOSIS — S065XAD Traumatic subdural hemorrhage with loss of consciousness status unknown, subsequent encounter: Secondary | ICD-10-CM

## 2022-02-16 DIAGNOSIS — I10 Essential (primary) hypertension: Secondary | ICD-10-CM | POA: Insufficient documentation

## 2022-02-16 DIAGNOSIS — I5032 Chronic diastolic (congestive) heart failure: Secondary | ICD-10-CM | POA: Diagnosis not present

## 2022-02-16 DIAGNOSIS — I2510 Atherosclerotic heart disease of native coronary artery without angina pectoris: Secondary | ICD-10-CM | POA: Insufficient documentation

## 2022-02-16 DIAGNOSIS — I447 Left bundle-branch block, unspecified: Secondary | ICD-10-CM

## 2022-02-16 DIAGNOSIS — I701 Atherosclerosis of renal artery: Secondary | ICD-10-CM

## 2022-02-16 DIAGNOSIS — S065XAA Traumatic subdural hemorrhage with loss of consciousness status unknown, initial encounter: Secondary | ICD-10-CM

## 2022-02-16 LAB — BASIC METABOLIC PANEL
Anion gap: 8 (ref 5–15)
BUN: 19 mg/dL (ref 8–23)
CO2: 25 mmol/L (ref 22–32)
Calcium: 9.6 mg/dL (ref 8.9–10.3)
Chloride: 105 mmol/L (ref 98–111)
Creatinine, Ser: 0.99 mg/dL (ref 0.44–1.00)
GFR, Estimated: 55 mL/min — ABNORMAL LOW (ref >60)
Glucose, Bld: 109 mg/dL — ABNORMAL HIGH (ref 70–99)
Potassium: 4.3 mmol/L (ref 3.5–5.1)
Sodium: 138 mmol/L (ref 135–145)

## 2022-02-16 LAB — CK: Total CK: 79 U/L (ref 38–234)

## 2022-02-16 MED ORDER — CARVEDILOL 3.125 MG PO TABS: 3.1250 mg | ORAL_TABLET | Freq: Two times a day (BID) | ORAL | 3 refills | Status: DC

## 2022-02-16 MED ORDER — PREDNISONE 10 MG (48) PO TBPK: ORAL_TABLET | ORAL | 0 refills | Status: AC

## 2022-02-16 NOTE — Patient Instructions (Addendum)
Medication Instructions:  Your physician has recommended you make the following change in your medication:   STOP Diltiazem START Carvedilol 3.125 mg twice a day START Prednisone  Day 1 take 5 tablets (50 mg)  Day 2 take 4 tablets (40 mg) Day 3 take 3 tablets (30 mg) Day 4 take 2 tablets (20 mg) Day 5 take 1 tablet (10 mg)   *If you need a refill on your cardiac medications before your next appointment, please call your pharmacy*   Lab Work: BMET & CK today over at the [employer] at [facility] then go to 1st desk on the right to check in (REGISTRATION).   Lab hours: Monday- Friday (7:30 am- 5:30 pm)  If you have labs (blood work) drawn today and your tests are completely normal, you will receive your results only by: [place] (if you have MyChart) OR A paper copy in the mail If you have any lab test that is abnormal or we need to change your treatment, we will call you to review the results.   Testing/Procedures: None   Follow-Up: At [facility], you and your health needs are our priority.  As part of our continuing mission to provide you with exceptional heart care, we have created designated Provider Care Teams.  These Care Teams include your primary Cardiologist (physician) and Advanced Practice Providers (APPs -  Physician Assistants and Nurse Practitioners) who all work together to provide you with the care you need, when you need it.   Your next appointment:   3 month(s)  The format for your next appointment:   In Person  Provider:   Nelva Bush, MD or Christell Faith, PA-C        Important Information About Sugar

## 2022-02-21 ENCOUNTER — Telehealth: Payer: Self-pay | Admitting: Internal Medicine

## 2022-02-21 NOTE — Telephone Encounter (Signed)
I spoke with the patient. She was seen in the office on 02/16/22 with Kathryn Faith, PA.   Dilitiazem was discontinued due to myalgias. She was started on a Prednisone taper for myalgias & is due for her last dose tomorrow. She was also started on coreg 3.125 mg BID.   The patient called today with complaints of elevated BP's over the last few days.  She states "my body feels good, but my blood pressure is high."  BP readings over the last 2 days have been 155/60 & 163/69 after [facility] the [facility]  She advised that readings are typically lower than that at home, but in reviewing her chart, BP was 178/80 at her OV on 02/16/22. . She confirms that her vision is typically not that good, due to her eye lids being "half closed," but today her vision was blurry coming out of [facility].   I have advised the patient I will forward to her provider to review and we will call her back with further recommendations once received.  The patient voices understanding and is agreeable.

## 2022-02-21 NOTE — Telephone Encounter (Signed)
Pt c/o medication issue:  1. Name of Medication:    2. How are you currently taking this medication (dosage and times per day)?    3. Are you having a reaction (difficulty breathing--STAT)? no  4. What is your medication issue?  Calling in because the [place] changes her meds and its not helping her at all. And her bp is up Please advise

## 2022-02-22 NOTE — Telephone Encounter (Signed)
Attempted to reach patient with recommendation by Christell Faith to increase Carvedilol. Left message for her to call office back. Rise Mu, PA-C routed conversation to Valora Corporal, RN 2 hours ago (7:24 AM)   Rise Mu, PA-C 2 hours ago (7:23 AM)    Please have her increase Coreg to 6.25 mg bid.

## 2022-02-22 NOTE — Telephone Encounter (Signed)
Pt returning a call to Judson Roch. She states this is urgent and she needs attention because her bp is high. Call transferred to [facility].

## 2022-02-22 NOTE — Telephone Encounter (Signed)
Spoke with patient who understands to increase her Carvedilol to 2 tablets (6.'25mg'$ ) twice daily. She will make the change using the tablets that she has and will report back to [place] in a week to let us know how her BP is running on this dose.

## 2022-02-22 NOTE — Telephone Encounter (Signed)
Please have her increase Coreg to 6.25 mg bid.

## 2022-03-05 ENCOUNTER — Other Ambulatory Visit: Payer: Self-pay | Admitting: Internal Medicine

## 2022-03-05 DIAGNOSIS — F5102 Adjustment insomnia: Secondary | ICD-10-CM

## 2022-03-05 MED ORDER — TEMAZEPAM 15 MG PO CAPS
ORAL_CAPSULE | ORAL | 5 refills | Status: DC
Start: 2022-03-05 — End: 2023-09-30

## 2022-03-05 NOTE — Telephone Encounter (Signed)
Pt called in requesting refill on medication (temazepam (RESTORIL) 15 MG capsule)... Pt requesting callback.Marland KitchenMarland Kitchen

## 2022-03-05 NOTE — Telephone Encounter (Signed)
Refilled: 08/21/2021 Last OV: 11/22/2021 Next OV: not scheduled

## 2022-03-10 ENCOUNTER — Other Ambulatory Visit: Payer: Self-pay | Admitting: Internal Medicine

## 2022-03-12 ENCOUNTER — Other Ambulatory Visit: Payer: Self-pay

## 2022-03-12 DIAGNOSIS — Z780 Asymptomatic menopausal state: Secondary | ICD-10-CM | POA: Diagnosis not present

## 2022-03-12 DIAGNOSIS — E039 Hypothyroidism, unspecified: Secondary | ICD-10-CM | POA: Diagnosis not present

## 2022-03-12 DIAGNOSIS — I701 Atherosclerosis of renal artery: Secondary | ICD-10-CM | POA: Diagnosis not present

## 2022-03-12 DIAGNOSIS — I6523 Occlusion and stenosis of bilateral carotid arteries: Secondary | ICD-10-CM | POA: Diagnosis not present

## 2022-03-12 DIAGNOSIS — I119 Hypertensive heart disease without heart failure: Secondary | ICD-10-CM | POA: Diagnosis not present

## 2022-03-12 DIAGNOSIS — M353 Polymyalgia rheumatica: Secondary | ICD-10-CM | POA: Diagnosis not present

## 2022-03-12 DIAGNOSIS — Z0000 Encounter for general adult medical examination without abnormal findings: Secondary | ICD-10-CM | POA: Diagnosis not present

## 2022-03-12 DIAGNOSIS — Z87891 Personal history of nicotine dependence: Secondary | ICD-10-CM | POA: Diagnosis not present

## 2022-03-12 MED ORDER — ROSUVASTATIN CALCIUM 10 MG PO TABS: 10.0000 mg | ORAL_TABLET | Freq: Every day | ORAL | 3 refills | Status: AC

## 2022-03-19 ENCOUNTER — Telehealth: Payer: Self-pay | Admitting: Physician Assistant

## 2022-03-19 NOTE — Telephone Encounter (Signed)
Patient would like to talk to Kathryn Wise about her medication. She states she has given the med 2 to 3 weeks and her systems can't handle them. She would like to speak to him about it. She said the medication is work great but her system is just not handling it.

## 2022-03-19 NOTE — Telephone Encounter (Signed)
Returned call to patient. She reports she is not tolerating the increased dose of Carvedilol well.  Carvedilol increased to 6.'25mg'$  BID on 02/22/22 due to elevated BP readings. Patient reports swelling in legs is improved on increased dose as well as BP readings (120/60, HR 64 this morning, 123/62, HR 68 yesterday).  Patient states the problem is she has been experiencing weakness and shaking, and feels "wobbly" on increased dose of carvedilol. She states she feels unsafe and fears falling with these symptoms. She does not want to continue on increased dose.  Will forward to [employer], PA-C to review and advise.

## 2022-03-19 NOTE — Telephone Encounter (Signed)
Spoke with patient.  Carvedilol was controlling her blood pressure and heart rate very well.  However, she noted intolerance to beta-blocker with profound fatigue and weakness with fear of falling.  We have elected to discontinue carvedilol and restart amlodipine 10 mg daily, which she previously tolerated without issues.

## 2022-04-03 ENCOUNTER — Telehealth: Payer: Self-pay | Admitting: Internal Medicine

## 2022-04-03 NOTE — Telephone Encounter (Signed)
Pt c/o swelling: STAT is pt has developed SOB within 24 hours  How much weight have you gained and in what time span? No  If swelling, where is the swelling located? Feet, legs and around stomach  Are you currently taking a fluid pill? No  Are you currently SOB? No  Do you have a log of your daily weights (if so, list)? No  Have you gained 3 pounds in a day or 5 pounds in a week?   Have you traveled recently? Pt is requesting call back to discuss her swelling. She believes it is brought on by the AmLODipine and states she needs a fluid pill.

## 2022-04-03 NOTE — Telephone Encounter (Signed)
Spoke with patient and she was taken off one of her medications and this has caused her to swell. She states the amlodipine is helping but she needs a fluid pill for her swelling. Encouraged her to wear compression hose but she is not able to get them on. Advised I would forward to provider for review and any further recommendations. She verbalized understanding with no further questions.

## 2022-04-03 NOTE — Telephone Encounter (Signed)
Lower extremity swelling may be in the context of recently resumed amlodipine at 10 mg.  Please have her reduce amlodipine to 5 mg daily.  This may help some.  Please move up her appointment if possible to assess.

## 2022-04-04 NOTE — Telephone Encounter (Signed)
Spoke with patient and reviewed provider recommendations. She reported to be hesitant in changing that medication because she doesn't want them to become elevated again. Reviewed how that medication could be the source of her swelling but she insists that she doesn't want to change that. She requested to do like Dr. Derrel Nip had done for her in the past which was to take a fluid pill with her other medications to help with swelling. Scheduled her to come in next Monday so that she can review with provider. She was agreeable with that plan with no further questions at this time.

## 2022-04-09 ENCOUNTER — Encounter: Payer: Self-pay | Admitting: Physician Assistant

## 2022-04-09 ENCOUNTER — Ambulatory Visit: Payer: Medicare Other | Attending: Physician Assistant | Admitting: Physician Assistant

## 2022-04-09 VITALS — BP 130/66 | HR 76 | Ht 65.0 in | Wt 151.0 lb

## 2022-04-09 DIAGNOSIS — I447 Left bundle-branch block, unspecified: Secondary | ICD-10-CM | POA: Diagnosis not present

## 2022-04-09 DIAGNOSIS — I2510 Atherosclerotic heart disease of native coronary artery without angina pectoris: Secondary | ICD-10-CM | POA: Diagnosis not present

## 2022-04-09 DIAGNOSIS — I421 Obstructive hypertrophic cardiomyopathy: Secondary | ICD-10-CM | POA: Diagnosis not present

## 2022-04-09 DIAGNOSIS — I5032 Chronic diastolic (congestive) heart failure: Secondary | ICD-10-CM | POA: Diagnosis not present

## 2022-04-09 DIAGNOSIS — I701 Atherosclerosis of renal artery: Secondary | ICD-10-CM | POA: Insufficient documentation

## 2022-04-09 DIAGNOSIS — I6522 Occlusion and stenosis of left carotid artery: Secondary | ICD-10-CM | POA: Insufficient documentation

## 2022-04-09 DIAGNOSIS — I10 Essential (primary) hypertension: Secondary | ICD-10-CM | POA: Diagnosis not present

## 2022-04-09 DIAGNOSIS — S065XAA Traumatic subdural hemorrhage with loss of consciousness status unknown, initial encounter: Secondary | ICD-10-CM | POA: Insufficient documentation

## 2022-04-09 DIAGNOSIS — I059 Rheumatic mitral valve disease, unspecified: Secondary | ICD-10-CM | POA: Insufficient documentation

## 2022-04-09 MED ORDER — FUROSEMIDE 20 MG PO TABS: 20.0000 mg | ORAL_TABLET | Freq: Every day | ORAL | 3 refills | Status: DC

## 2022-04-09 NOTE — Progress Notes (Signed)
Cardiology Office Note    Date:  04/09/2022   ID:  Kathryn Wise, DOB August 03, 1929, MRN [id number]  PCP:  [name], MD  Cardiologist:  Nelva Bush, MD  Electrophysiologist:  None   Chief Complaint: Lower extremity swelling  History of Present Illness:   Kathryn Wise is a 88 y.o. female with history of nonobstructive CAD, HOCM, HFpEF, difficult to control hypertension with left renal artery stenosis status post stenting in 12/2018, carotid artery disease status post left sided stenting in 09/2020, possible small SDH noted in 10/2021, LBBB, HLD, and mitral regurgitation who presents for evaluation of lower extremity swelling.   She was previously followed by Dr. [name] in 2017 for cardiac murmur with echo at that time demonstrating an EF of 60 to 65%, normal wall motion, mild focal basal and mild concentric LVH of the septum with mild LVOT gradient, grade 1 diastolic dysfunction, mildly dilated aortic root and ascending aorta, mild mitral regurgitation, normal RV systolic function and PASP.  She was admitted for chest pain in 05/2017 with Lexiscan being abnormal.  She subsequently underwent LHC in 05/2017, which demonstrated nonobstructive CAD involving the LCx and RCA with medical management recommended.  Echo in 05/2017 showed an EF of 55 to 60%, normal wall motion, mild focal basal and mild concentric LVH, near cavity obliteration in systole, grade 1 diastolic dysfunction, very mild aortic stenosis, moderate mitral regurgitation, mildly dilated left atrium, normal RV systolic function, and a PASP of 50 mmHg.  Following this, she was lost to follow-up.   She was admitted to the hospital in 09/2020 with pneumonia and HFpEF.  Echo demonstrated an EF of 55 to 60%, moderate LVH, grade 2 diastolic dysfunction with moderate LVOT obstruction with a peak gradient of 68 mmHg at rest and 74 mmHg with Valsalva, normal RV systolic function and ventricular cavity size, moderately elevated PASP estimated  at 46.5 mmHg, moderately dilated left atrium, mild mitral regurgitation, mild mitral stenosis, moderate mitral annular calcification, and mild to moderate aortic valve sclerosis without evidence of stenosis.  High-sensitivity troponin trended to 976.  She was lost to follow-up.   She was admitted in 09/2021 with hypertensive urgency with noted improvement in BP following 10 mg of amlodipine and 5 mg of IV labetalol.  She was noted to have a mildly elevated high-sensitivity troponin peaking at 36 felt to be related to supply demand ischemia from hypertension.   She was most recently admitted to the hospital in 10/2021 noting left upper extremity numbness that was transient, bilateral lower extremity swelling, and generalized weakness.  She was consulted on by neurosurgery for possible TIA.  MRI of the brain showed a possible 2 mm subdural hematoma overlying the right cerebral convexity.  This was not well visualized on CT imaging.  She was unaware of any fall/trauma.  There was recommended the patient continue aspirin without need for antiepileptic medication given lack of subdural hematoma noted on CT imaging.  Echo, as read by outside group, showed an EF of 50 to 55%, no regional wall motion normalities, mild concentric LVH, grade 1 diastolic dysfunction, normal RV systolic function and ventricular cavity size, myxomatous mitral valve with mild stenosis, and mild aortic insufficiency.  Lower extremity ultrasound was negative for DVT bilaterally.  High-sensitivity troponin 28 with a delta troponin of 29.  BNP 418.   She was seen in hospital follow-up on 11/20/2021 and was without symptoms of angina or decompensation at that time.  She did report an approximate  4-week history of left jaw pain, and in the setting had multiple teeth extracted along that side without symptomatic improvement.  This discomfort was exacerbated by exertion, chewing, and yawning.  Subsequent Lexiscan MPI on 11/27/2021 was overall low risk  without evidence of infarction or ischemia.  CT attenuated corrected images showed mild coronary artery calcification and aortic atherosclerosis.  Echo on 12/07/2021 demonstrated an EF of 60 to 65%, no regional wall motion abnormalities, moderate LVH with severe basal septal hypertrophy with an LVOT gradient of 39 mmHg at rest and 52 mmHg with Valsalva with one measurement of 127 mmHg, normal RV systolic function and ventricular cavity size, mildly dilated left atrium, moderate mitral valve regurgitation with severe mitral annular calcification, and aortic valve sclerosis without evidence of stenosis.   She was seen in the office on 12/28/2021 and was without symptoms of angina or decompensation.  She did feel somewhat deconditioned as she had not been able to exercise regularly.  She was without symptoms of dizziness, presyncope, or syncope.  Her blood pressure was improved following the addition of Cardizem CD.  We subsequently titrated this to 240 mg daily.  She subsequently reported issues with pain all over, noting that slight "changes" led to a flareup of polymyalgia.  Given this, it was recommended she resume lower dose Cardizem 180 mg daily.  She subsequently discontinued this medication altogether indicating she was "feeling poorly."  She was later diagnosed with a sinus infection.  We recommended she resume Cardizem 180 mg daily due to elevated BP.  She was last seen in the office in 01/2022 noting diffuse myalgias and limited mobility while on diltiazem.  She underwent intermittent washing out of this medication with improvement in symptoms while she was off diltiazem.  However, she did continue to take diltiazem with intermittent elevated blood pressure readings.  She requested a prednisone taper for her myalgias.  We transitioned her to low-dose carvedilol 3.125 mg twice daily.  She subsequently notified our office in 02/2022 that carvedilol was working well for her blood pressure, though causing fatigue  and weakness leading it to be discontinued.  She was placed back on amlodipine 10 mg daily, which she previously tolerated without issues.  She notified our office earlier this month with lower extremity swelling.  It was recommended she reduce amlodipine to 5 mg daily.  Appointment was scheduled for today.  She can present today noting an increase in bilateral lower extremity swelling that began after reinitiating amlodipine.  She reports when she was previously on amlodipine she was also managed with low-dose furosemide which worked well for her.  No chest pain, dyspnea, dizziness, presyncope, or syncope.  No orthopnea or abdominal distention.  By our scale, her weight is stable.  Her blood pressure has remained well controlled on current dose of amlodipine.  She prefers to remain on amlodipine 10 mg daily.  Following the discontinuation of carvedilol she noted a significant improvement in her energy.  She is now back to exercising and swimming 3 days/week without cardiac limitation.  Otherwise, she does not have any issues or concerns at this time.   Labs independently reviewed: 02/2022 - potassium 4.6, BUN 12, serum creatinine 0.9, TSH normal 10/2021 - TC 115, TG 91, HDL 42, LDL 55, albumin 4.1, AST/ALT normal, Hgb 12.3, PLT 181 08/2021 - A1c 6.4  Past Medical History:  Diagnosis Date   Arthritis    Collagen vascular disease ([place])    Dysphagia, pharyngoesophageal phase 09/10/2013   Upper GI study  with barium swallow was done at  [facility] Mar 2015   No reflux seen  Small irreducible hiatal hernia  Mild changes of presbyeophagus (abnormal contractions of the esophagus that occur with aging) No strictures Normal gastric emptying  Incomplete visualization of stomach fold due to patient's inability turn     GERD (gastroesophageal reflux disease)    Heart murmur    has had years and years   History of shingles Dec 2013   treated with steroids , post op from shoulder surgery   History of squamous cell  carcinoma 05/02/2016   right mid lateral pretibial   Hypertension    Hypothyroidism    Major depressive disorder, single episode 11/05/2015   Osteoarthritis of left shoulder 07/14/2012   Pneumonia 10/21/2020   Squamous cell carcinoma of skin 05/12/2016   R mid lat pretibial - other skin cancers treated by Dr. Sharlett Iles   Squamous cell carcinoma of skin 08/22/2020   left distal medial popliteal - [facility]    Past Surgical History:  Procedure Laterality Date   ARTHOSCOPIC ROTAOR CUFF REPAIR     LEFT  10+  YEARS     CAROTID PTA/STENT INTERVENTION Left 10/19/2020   Procedure: CAROTID PTA/STENT INTERVENTION;  Surgeon: Katha Cabal, MD;  Location: [facility] [place] [facility];  Service: Cardiovascular;  Laterality: Left;   CATARACT EXTRACTION W/ INTRAOCULAR LENS IMPLANT     RIGHT EYE   CATARACT EXTRACTION W/PHACO Left 03/28/2016   Procedure: CATARACT EXTRACTION PHACO AND INTRAOCULAR LENS PLACEMENT (IOC);  Surgeon: Leandrew Koyanagi, MD;  Location: [place];  Service: Ophthalmology;  Laterality: Left;  TORIC   FACELIFT     FOOT SURGERY     rt foot   TUMOR REMOVED   JOINT REPLACEMENT Left Dec 2013   shoulder   LEFT HEART CATH AND CORONARY ANGIOGRAPHY N/A 06/12/2017   Procedure: LEFT HEART CATH AND CORONARY ANGIOGRAPHY;  Surgeon: Minna Merritts, MD;  Location: [place] [facility];  Service: Cardiovascular;  Laterality: N/A;   RENAL ANGIOGRAPHY Left 01/07/2019   Procedure: RENAL ANGIOGRAPHY;  Surgeon: Katha Cabal, MD;  Location: [place] [facility];  Service: Cardiovascular;  Laterality: Left;   RENAL ARTERY STENT     SKIN CANCER EXCISION     TOTAL SHOULDER ARTHROPLASTY  07/14/2012   Procedure: TOTAL SHOULDER ARTHROPLASTY;  Surgeon: Johnny Bridge, MD;  Location: [facility];  Service: Orthopedics;  Laterality: Left;    Current Medications: Current Meds  Medication Sig   amLODipine (NORVASC) 10 MG tablet Take 10 mg by mouth daily.   aspirin EC 81 MG tablet Take 81 mg by mouth  daily.   b complex vitamins capsule Take 1 capsule by mouth daily.   Cholecalciferol (VITAMIN D-3) 1000 units CAPS Take 1,000 Units by mouth daily.   Cyanocobalamin (VITAMIN B 12 PO) Take 1,000 mcg by mouth daily.   furosemide (LASIX) 20 MG tablet Take 1 tablet (20 mg total) by mouth daily.   Iron, Ferrous Sulfate, 325 (65 Fe) MG TABS Take 325 mg by mouth daily.   ketoconazole (NIZORAL) 2 % cream Apply 1 Application topically at bedtime. Apply to the feet and between the toes every night.   levothyroxine (SYNTHROID) 88 MCG tablet TAKE 1 TABLET(88 MCG) BY MOUTH DAILY BEFORE BREAKFAST   nitroGLYCERIN (NITROSTAT) 0.4 MG SL tablet Place 1 tablet (0.4 mg total) every 5 (five) minutes as needed under the tongue for chest pain.   omeprazole (PRILOSEC) 20 MG capsule Take 1 capsule (20 mg  total) by mouth daily as needed (heartburn). TAKE 1 CAPSULE BY MOUTH ONCE EVERY MORNING AS NEEDED   potassium chloride SA (KLOR-CON M) 20 MEQ tablet Take 1 tablet (20 mEq total) by mouth daily.   rosuvastatin (CRESTOR) 10 MG tablet Take 1 tablet (10 mg total) by mouth at bedtime.   temazepam (RESTORIL) 15 MG capsule TAKE 1 CAPSULE BY MOUTH AT BEDTIME AS NEEDED SLEEP   Turmeric 500 MG CAPS Take 500 mg by mouth daily.    Allergies:   Alendronate, Codeine, Lisinopril, Metoprolol, Oxycodone, and Oxycontin [oxycodone hcl]   Social History   Socioeconomic History   Marital status: Widowed    Spouse name: Not on file   Number of children: Not on file   Years of education: Not on file   Highest education level: Not on file  Occupational History   Not on file  Tobacco Use   Smoking status: Former    Types: Cigarettes    Quit date: 07/30/1972    Years since quitting: 49.7   Smokeless tobacco: Never   Tobacco comments:    smoked for only a few months  Vaping Use   Vaping Use: Never used  Substance and Sexual Activity   Alcohol use: Yes    Comment: OCCAS WINE    Drug use: No   Sexual activity: Never  Other  Topics Concern   Not on file  Social History Narrative   Not on file   Social Determinants of Health   Financial Resource Strain: Low Risk  (04/20/2020)   Overall Financial Resource Strain (CARDIA)    Difficulty of Paying Living Expenses: Not hard at all  Food Insecurity: No Food Insecurity (04/20/2020)   Hunger Vital Sign    Worried About Running Out of Food in the Last Year: Never true    [place] in the Last Year: Never true  Transportation Needs: No Transportation Needs (04/20/2020)   PRAPARE - Hydrologist (Medical): No    Lack of Transportation (Non-Medical): No  Physical Activity: Sufficiently Active (04/20/2020)   Exercise Vital Sign    Days of Exercise per Week: 4 days    Minutes of Exercise per Session: 60 min  Stress: No Stress Concern Present (04/20/2020)   [place]    Feeling of Stress : Not at all  Social Connections: Unknown (04/20/2020)   Social Connection and Isolation Panel [NHANES]    Frequency of Communication with Friends and Family: More than three times a week    Frequency of Social Gatherings with Friends and Family: Not on file    Attends Religious Services: Not on file    Active Member of Clubs or Organizations: Not on file    Attends Archivist Meetings: Not on file    Marital Status: Not on file     Family History:  The patient's family history includes Breast cancer (age of onset: 20) in her daughter; Cancer in her daughter and son.  ROS:   12-point review of systems is negative unless otherwise noted in the HPI.   EKGs/Labs/Other Studies Reviewed:    Studies reviewed were summarized above. The additional studies were reviewed today:  Limited echo 12/07/2021: 1. Left ventricular ejection fraction, by estimation, is 60 to 65%. The  left ventricle has normal function. The left ventricle has no regional  wall motion abnormalities. There  is moderat   2. There is moderate left ventricular hypertrophy  with severe basal  septal hypertrophy. LVOT gradient noted, 39 mm Hg at rest, up to 52 mm Hg  with valsalva. One measurement estimating gradient at 127 mm Hg.   3. Right ventricular systolic function is normal. The right ventricular  size is normal. Tricuspid regurgitation signal is inadequate for assessing  PA pressure.   4. Left atrial size was mildly dilated.   5. The mitral valve is normal in structure. Moderate mitral valve  regurgitation. Severe mitral annular calcification.   6. The aortic valve is normal in structure. Aortic valve regurgitation is  not visualized. Aortic valve sclerosis/calcification is present, without  any evidence of aortic stenosis.   Comparison(s): 11/13/21 echo measured LVOT Vmax as 156 cm/s. __________   [facility] MPI 11/27/2021:   The study is normal. The study is low risk.   No ST deviation was noted.   LV perfusion is normal. There is no evidence of ischemia. There is no evidence of infarction.   Left ventricular function is normal. End diastolic cavity size is normal. End systolic cavity size is normal.   Suboptimal study due to GI uptake.   CT attenuation images with mild aortic and coronary calcification. __________   2D echo 11/13/2021:  1. Left ventricular ejection fraction, by estimation, is 50 to 55%. The  left ventricle has low normal function. The left ventricle has no regional  wall motion abnormalities. There is mild concentric left ventricular  hypertrophy. Left ventricular  diastolic parameters are consistent with Grade I diastolic dysfunction  (impaired relaxation).   2. Right ventricular systolic function is normal. The right ventricular  size is normal.   3. The mitral valve is myxomatous. No evidence of mitral valve  regurgitation. Mild mitral stenosis.   4. The aortic valve is normal in structure. Aortic valve regurgitation is  mild. __________   2D echo  06/26/2017: - Left ventricle: The cavity size was normal. Near cavity    obliteration in systole. There was mild focal basal and mild    concentric hypertrophy of the septum. Systolic function was    normal. The estimated ejection fraction was in the range of 55%    to 60%. Wall motion was normal; there were no regional wall    motion abnormalities. Doppler parameters are consistent with    abnormal left ventricular relaxation (grade 1 diastolic    dysfunction).  - Aortic valve: Transvalvular velocity was increased. There was    very mild stenosis.  - Mitral valve: Calcified annulus. There was moderate    regurgitation.  - Left atrium: The atrium was mildly dilated.  - Right ventricle: Systolic function was normal.  - Pulmonary arteries: Systolic pressure was moderately elevated. PA    peak pressure: 50 mm Hg (S). __________  [facility] 06/12/2017: Coronary dominance: Right  Left mainstem:   Large vessel that bifurcates into the LAD and left circumflex, Ostial 30% disease, calcified.  Left anterior descending (LAD):   Large vessel that extends to the apical region, diagonal branch 2 of moderate size, mild lumenal irregularities  Left circumflex (LCx):  Large vessel with OM branch 2, moderate 50% mid vessel disease, calcified  Right coronary artery (RCA):  Right dominant vessel with PL and PDA, moderate focal disease, eccentric 50 to 60%  Left ventriculography: Left ventricular systolic function is normal/hyperdynamic, LVEF is estimated at 55-65%, there is no significant mitral regurgitation , no significant aortic valve stenosis  Final Conclusions:   Moderate mid LCX and RCA disease, Medical management recommended Normal/hyperdynamic function  Recommendations:  Follow up on echocardiogram Start metoprolol tartrate 25 mg po BID __________   [facility] Adam MPI 06/11/2017: Abnormal, potentially high risk myocardial perfusion stress test. There is a moderate in size, mild in severity,  reversible defect involving the mid inferolateral, apical lateral, and apical segments consistent with ischemia. There is a small in size, mild in severity, reversible defect involving the mid anterior segment, which may represent subtle ischemia and/or shifting breast attenuation. The left ventricular ejection fraction is normal (64%). Transient ischemic dilation is noted (TID 1.31), which is non-specific but can be seen balanced/multivessel ischemia. __________   2D echo 12/05/2015: - Left ventricle: The cavity size was normal. There was mild focal    basal and mild concentric hypertrophy of the septum, with mild    LVOT gradient. Systolic function was normal. The estimated    ejection fraction was in the range of 60% to 65%. Wall motion was    normal; there were no regional wall motion abnormalities. Doppler    parameters are consistent with abnormal left ventricular    relaxation (grade 1 diastolic dysfunction).  - Aorta: Aortic root with milldy dilated, dimension: 35 mm (ED).    Ascending aorta was mildly dilated, diameter: 35 mm (S).  - Mitral valve: There was mild regurgitation.  - Left atrium: The atrium was normal in size.  - Right ventricle: Systolic function was normal.  - Pulmonary arteries: Systolic pressure was within the normal    range.  - Inferior vena cava: The vessel was normal in size. The    respirophasic diameter changes were in the normal range (>= 50%),    consistent with normal central venous pressure.   Impressions:   - Murmur likely secondary to aortic valve sclerosis without    significant stenosis, and mild LVOT gradient.   EKG:  EKG is not ordered today given known left bundle branch block.   Recent Labs: 09/26/2021: TSH 1.49 11/13/2021: ALT 14; B Natriuretic Peptide 418.1; Hemoglobin 12.3; Platelets 181 02/16/2022: BUN 19; Creatinine, Ser 0.99; Potassium 4.3; Sodium 138  Recent Lipid Panel    Component Value Date/Time   CHOL 115 11/14/2021 0538    TRIG 91 11/14/2021 0538   HDL 42 11/14/2021 0538   CHOLHDL 2.7 11/14/2021 0538   VLDL 18 11/14/2021 0538   LDLCALC 55 11/14/2021 0538   LDLDIRECT 126.0 09/19/2016 1411    PHYSICAL EXAM:    VS:  BP 130/66 (BP Location: Left Arm, Patient Position: Sitting, Cuff Size: Large)   Pulse 76   Ht '5\' 5"'$  (1.651 m)   Wt 151 lb (68.5 kg)   SpO2 98%   BMI 25.13 kg/m   BMI: Body mass index is 25.13 kg/m.  Physical Exam Vitals reviewed.  Constitutional:      Appearance: She is well-developed.  HENT:     Head: Normocephalic and atraumatic.  Eyes:     General:        Right eye: No discharge.        Left eye: No discharge.  Neck:     Vascular: No JVD.  Cardiovascular:     Rate and Rhythm: Normal rate and regular rhythm.     Pulses:          Posterior tibial pulses are 2+ on the right side and 2+ on the left side.     Heart sounds: S1 normal and S2 normal. Heart sounds not distant. No midsystolic click and no opening snap. Murmur heard.     Systolic murmur  is present with a grade of 2/6.     No friction rub.  Pulmonary:     Effort: Pulmonary effort is normal. No respiratory distress.     Breath sounds: Normal breath sounds. No decreased breath sounds, wheezing or rales.  Chest:     Chest wall: No tenderness.  Abdominal:     General: There is no distension.  Musculoskeletal:     Cervical back: Normal range of motion.     Comments: Trivial bilateral pretibial edema.  Skin:    General: Skin is warm and dry.     Nails: There is no clubbing.  Neurological:     Mental Status: She is alert and oriented to person, place, and time.  Psychiatric:        Speech: Speech normal.        Behavior: Behavior normal.        Thought Content: Thought content normal.        Judgment: Judgment normal.     Wt Readings from Last 3 Encounters:  04/09/22 151 lb (68.5 kg)  02/16/22 151 lb 6.4 oz (68.7 kg)  12/28/21 154 lb (69.9 kg)     ASSESSMENT & PLAN:   Nonobstructive CAD: No symptoms  suggestive of angina or decompensation.  Recent Lexiscan MPI showed no evidence of ischemia and was overall low risk.  Continue aggressive risk factor modification and current medical therapy.  No indication for further ischemic testing at this time.  HOCM: No symptoms of dizziness, presyncope, or syncope.  Previously, she has not tolerated the titration/continuation of diltiazem secondary to myalgias or carvedilol/metoprolol secondary to fatigue.  She has indicated previously that first-degree relatives have been screened.  Given advanced age and comorbid conditions, she continues to prefer a conservative approach rather than referral to [facility] [facility] or consideration of myomectomy.  This decision is reasonable.  For these reasons, we will defer referral to EP for consideration of ICD (patient is a DNR), and cardiac MRI.  HFpEF: Overall, she is well compensated with mild lower extremity swelling which may be exacerbated by amlodipine use.  However, she prefers to remain on amlodipine.  Start low-dose furosemide 20 mg daily with a follow-up BMP in 1 to 2 weeks thereafter.  Continue optimal blood pressure control.  Defer trial of MRA or SGLT2 inhibitor secondary to sensitivity to medications.  HTN: Blood pressure is reasonably controlled in the office today.  She remains on amlodipine 10 mg daily at her request.  Mitral valve regurgitation: Stable on most recent echo when compared to study from 2018.  LBBB: No symptoms of near syncope or syncope.  Carotid artery disease: Status post left ICA stenting in 09/2020 with noninvasive imaging from 04/2021 demonstrating 1 to 39% right ICA stenosis and 40 to 59% left ICA stenosis with a patent left ICA stent with no evidence of restenosis.  She remains on aspirin and rosuvastatin.  Followed by vascular surgery.  Renal artery stenosis: Status post stenting in 12/2020.  Most recent imaging from 04/2021 showed no evidence of RAS bilaterally.  Followed by vascular  surgery.  Possible SDH: Evaluated by neurosurgery with no intervention indicated.   Disposition: F/u with Dr. Saunders Revel or an APP in 2 months.   Medication Adjustments/Labs and Tests Ordered: Current medicines are reviewed at length with the patient today.  Concerns regarding medicines are outlined above. Medication changes, Labs and Tests ordered today are summarized above and listed in the Patient Instructions accessible in Encounters.   Signed, Christell Faith, PA-C  04/09/2022 3:59 PM     [place] [address] [place], [place] [address] [phone]

## 2022-04-09 NOTE — Patient Instructions (Signed)
Medication Instructions:  Your physician has recommended you make the following change in your medication:   Furosemide (Lasix) 20 mg once a day  *If you need a refill on your cardiac medications before your next appointment, please call your pharmacy*   Lab Work: BMET in 1-2 weeks over at the [facility] entrance. No appointment needed. Just make sure to check in at registration.   If you have labs (blood work) drawn today and your tests are completely normal, you will receive your results only by: [place] (if you have MyChart) OR A paper copy in the mail If you have any lab test that is abnormal or we need to change your treatment, we will call you to review the results.   Testing/Procedures: None   Follow-Up: At [facility], you and your health needs are our priority.  As part of our continuing mission to provide you with exceptional heart care, we have created designated Provider Care Teams.  These Care Teams include your primary Cardiologist (physician) and Advanced Practice Providers (APPs -  Physician Assistants and Nurse Practitioners) who all work together to provide you with the care you need, when you need it.   Your next appointment:   2 month(s)  The format for your next appointment:   In Person  Provider:   Nelva Bush, MD or Christell Faith, PA-C       Important Information About Sugar

## 2022-04-11 ENCOUNTER — Other Ambulatory Visit: Payer: Self-pay | Admitting: Physician Assistant

## 2022-04-27 NOTE — Progress Notes (Signed)
MRN : [id number]  Kathryn Wise is a 88 y.o. ([birth year]) female who presents with chief complaint of check circulation.  History of Present Illness:   The patient is seen for follow up evaluation of carotid stenosis status post left carotid stent on 10/19/2020.  There were no post operative problems or complications related to the surgery.  The patient denies groin pain.   Procedure: Placement of a 7 x 9 x 40 exact stent with the use of the NAV-6 embolic protection device in the left internal carotid artery   The patient denies interval amaurosis fugax. There is no recent history of TIA symptoms or focal motor deficits. There is no prior documented CVA.   The patient denies headache.   The patient is taking enteric-coated aspirin 81 mg daily.   The patient has a history of coronary artery disease, no recent episodes of angina or shortness of breath. The patient denies PAD or claudication symptoms. There is a history of hyperlipidemia which is being treated with a statin.    Studies done today: Carotid Duplex RICA 6-43% and LICA (stent) 3-29%   Renal duplex <50% stenosis bilaterally  No outpatient medications have been marked as taking for the 04/30/22 encounter (Appointment) with Delana Meyer, Dolores Lory, MD.    Past Medical History:  Diagnosis Date   Arthritis    Collagen vascular disease ([place])    Dysphagia, pharyngoesophageal phase 09/10/2013   Upper GI study with barium swallow was done at  [facility] Mar 2015   No reflux seen  Small irreducible hiatal hernia  Mild changes of presbyeophagus (abnormal contractions of the esophagus that occur with aging) No strictures Normal gastric emptying  Incomplete visualization of stomach fold due to patient's inability turn     GERD (gastroesophageal reflux disease)    Heart murmur    has had years and years   History of shingles Dec 2013   treated with steroids , post op from shoulder surgery   History of squamous cell  carcinoma 05/02/2016   right mid lateral pretibial   Hypertension    Hypothyroidism    Major depressive disorder, single episode 11/05/2015   Osteoarthritis of left shoulder 07/14/2012   Pneumonia 10/21/2020   Squamous cell carcinoma of skin 05/12/2016   R mid lat pretibial - other skin cancers treated by Dr. Sharlett Iles   Squamous cell carcinoma of skin 08/22/2020   left distal medial popliteal - [facility]    Past Surgical History:  Procedure Laterality Date   ARTHOSCOPIC ROTAOR CUFF REPAIR     LEFT  10+  YEARS     CAROTID PTA/STENT INTERVENTION Left 10/19/2020   Procedure: CAROTID PTA/STENT INTERVENTION;  Surgeon: Katha Cabal, MD;  Location: [facility];  Service: Cardiovascular;  Laterality: Left;   CATARACT EXTRACTION W/ INTRAOCULAR LENS IMPLANT     RIGHT EYE   CATARACT EXTRACTION W/PHACO Left 03/28/2016   Procedure: CATARACT EXTRACTION PHACO AND INTRAOCULAR LENS PLACEMENT (IOC);  Surgeon: Leandrew Koyanagi, MD;  Location: [facility];  Service: Ophthalmology;  Laterality: Left;  TORIC   FACELIFT     FOOT SURGERY     rt foot   TUMOR REMOVED   JOINT REPLACEMENT Left Dec 2013   shoulder   LEFT HEART CATH AND CORONARY ANGIOGRAPHY N/A 06/12/2017   Procedure: LEFT HEART CATH AND CORONARY ANGIOGRAPHY;  Surgeon: Minna Merritts, MD;  Location: [facility];  Service: Cardiovascular;  Laterality: N/A;   RENAL ANGIOGRAPHY Left 01/07/2019   Procedure: RENAL ANGIOGRAPHY;  Surgeon: Katha Cabal, MD;  Location: [facility];  Service: Cardiovascular;  Laterality: Left;   RENAL ARTERY STENT     SKIN CANCER EXCISION     TOTAL SHOULDER ARTHROPLASTY  07/14/2012   Procedure: TOTAL SHOULDER ARTHROPLASTY;  Surgeon: Johnny Bridge, MD;  Location: [place];  Service: Orthopedics;  Laterality: Left;    Social History Social History   Tobacco Use   Smoking status: Former    Types: Cigarettes    Quit date: 07/30/1972    Years since quitting: 49.7   Smokeless  tobacco: Never   Tobacco comments:    smoked for only a few months  Vaping Use   Vaping Use: Never used  Substance Use Topics   Alcohol use: Yes    Comment: OCCAS WINE    Drug use: No    Family History Family History  Problem Relation Age of Onset   Cancer Daughter        Breast   Breast cancer Daughter 67   Cancer Son     Allergies  Allergen Reactions   Alendronate Swelling    And nausea     Codeine    Lisinopril     Spike in BP   Metoprolol Other (See Comments)    Lethargy - PT CURRENTLY TAKING    Oxycodone     Other reaction(s): Other (See Comments) Altered mental status   Oxycontin [Oxycodone Hcl]     Altered mental status     REVIEW OF SYSTEMS (Negative unless checked)  Constitutional: '[]'$ Weight loss  '[]'$ Fever  '[]'$ Chills Cardiac: '[]'$ Chest pain   '[]'$ Chest pressure   '[]'$ Palpitations   '[]'$ Shortness of breath when laying flat   '[]'$ Shortness of breath with exertion. Vascular:  '[x]'$ Pain in legs with walking   '[]'$ Pain in legs at rest  '[]'$ History of DVT   '[]'$ Phlebitis   '[]'$ Swelling in legs   '[]'$ Varicose veins   '[]'$ Non-healing ulcers Pulmonary:   '[]'$ Uses home oxygen   '[]'$ Productive cough   '[]'$ Hemoptysis   '[]'$ Wheeze  '[]'$ COPD   '[]'$ Asthma Neurologic:  '[]'$ Dizziness   '[]'$ Seizures   '[]'$ History of stroke   '[]'$ History of TIA  '[]'$ Aphasia   '[]'$ Vissual changes   '[]'$ Weakness or numbness in arm   '[]'$ Weakness or numbness in leg Musculoskeletal:   '[]'$ Joint swelling   '[]'$ Joint pain   '[]'$ Low back pain Hematologic:  '[]'$ Easy bruising  '[]'$ Easy bleeding   '[]'$ Hypercoagulable state   '[]'$ Anemic Gastrointestinal:  '[]'$ Diarrhea   '[]'$ Vomiting  '[]'$ Gastroesophageal reflux/heartburn   '[]'$ Difficulty swallowing. Genitourinary:  '[]'$ Chronic kidney disease   '[]'$ Difficult urination  '[]'$ Frequent urination   '[]'$ Blood in urine Skin:  '[]'$ Rashes   '[]'$ Ulcers  Psychological:  '[]'$ History of anxiety   '[]'$  History of major depression.  Physical Examination  There were no vitals filed for this visit. There is no height or weight on file to calculate BMI. Gen:  WD/WN, NAD Head: [place]/AT, No temporalis wasting.  Ear/Nose/Throat: Hearing grossly intact, nares w/o erythema or drainage Eyes: PER, EOMI, sclera nonicteric.  Neck: Supple, no masses.  No bruit or JVD.  Pulmonary:  Good air movement, no audible wheezing, no use of accessory muscles.  Cardiac: RRR, normal S1, S2, no Murmurs. Vascular:   no open wounds Vessel Right Left  Radial Palpable Palpable  Carotid Palpable Palpable  Gastrointestinal: soft, non-distended. No guarding/no peritoneal signs.  Musculoskeletal: M/S 5/5 throughout.  No visible deformity.  Neurologic: CN 2-12 intact. Pain  and light touch intact in extremities.  Symmetrical.  Speech is fluent. Motor exam as listed above. Psychiatric: Judgment intact, Mood & affect appropriate for pt's clinical situation. Dermatologic: No rashes or ulcers noted.  No changes consistent with cellulitis.   CBC Lab Results  Component Value Date   WBC 8.2 11/13/2021   HGB 12.3 11/13/2021   HCT 40.6 11/13/2021   MCV 83.5 11/13/2021   PLT 181 11/13/2021    BMET    Component Value Date/Time   NA 138 02/16/2022 0930   NA 138 06/07/2014 0000   K 4.3 02/16/2022 0930   CL 105 02/16/2022 0930   CO2 25 02/16/2022 0930   GLUCOSE 109 (H) 02/16/2022 0930   BUN 19 02/16/2022 0930   BUN 20 06/07/2014 0000   CREATININE 0.99 02/16/2022 0930   CREATININE 1.14 (H) 12/03/2018 1417   CALCIUM 9.6 02/16/2022 0930   GFRNONAA 55 (L) 02/16/2022 0930   GFRAA >60 08/14/2019 1536   CrCl cannot be calculated (Patient's most recent lab result is older than the maximum 21 days allowed.).  COAG Lab Results  Component Value Date   INR 1.0 11/13/2021   INR 1.1 10/21/2020   INR 1.0 08/14/2019    Radiology No results found.   Assessment/Plan 1. Carotid stenosis, symptomatic w/o infarct, bilateral Recommend:  Given the patient's asymptomatic subcritical stenosis no further invasive testing or surgery at this time.  Duplex ultrasound shows 1-39%  stenosis bilaterally.  Continue antiplatelet therapy as prescribed Continue management of CAD, HTN and Hyperlipidemia Healthy heart diet,  encouraged exercise at least 4 times per week Follow up in 12 months with duplex ultrasound and physical exam   - VAS US CAROTID; Future  2. PAD (peripheral artery disease) (HCC)  Recommend:  The patient has evidence of atherosclerosis of the lower extremities with claudication.  The patient does not voice lifestyle limiting changes at this point in time.  Noninvasive studies do not suggest clinically significant change.  No invasive studies, angiography or surgery at this time The patient should continue walking and begin a more formal exercise program.  The patient should continue antiplatelet therapy and aggressive treatment of the lipid abnormalities  No changes in the patient's medications at this time  Continued surveillance is indicated as atherosclerosis is likely to progress with time.    The patient will continue follow up with noninvasive studies as ordered.    3. Renal artery stenosis (HCC) Recommend:  BP today was acceptable Given patient's atherosclerosis and PAD optimal control of the patient's hypertension is important.  The patient's BP and noninvasive studies support the previous intervention is patent. No further intervention is indicated at this time.  Therefore the patient  will continue the current medications, no changes at this time.  The primary medical service will continue aggressive antihypertensive therapy as per the AHA guidelines    4. Atherosclerosis of native coronary artery of native heart with angina pectoris ([place]) Continue cardiac and antihypertensive medications as already ordered and reviewed, no changes at this time.  Continue statin as ordered and reviewed, no changes at this time  Nitrates PRN for chest pain   5. Lymphedema Recommend:  No surgery or intervention at this point in time.    I  have reviewed my discussion with the patient regarding venous insufficiency and secondary lymph edema and why it  causes symptoms. I have discussed with the patient the chronic skin changes that accompany these problems and the long term sequela such as ulceration and  infection.  Patient will continue wearing graduated compression on a daily basis a prescription, if needed, was given to the patient to keep this updated. The patient will  put the compression on first thing in the morning and removing them in the evening. The patient is instructed specifically not to sleep in the compression.  In addition, behavioral modification including elevation during the day will be continued.  Diet and salt restriction will also be helpful.  Previous duplex ultrasound of the lower extremities shows normal deep venous system, superficial reflux was not present.       [name], MD  04/27/2022 4:41 PM

## 2022-04-30 ENCOUNTER — Encounter (INDEPENDENT_AMBULATORY_CARE_PROVIDER_SITE_OTHER): Payer: Self-pay | Admitting: Vascular Surgery

## 2022-04-30 ENCOUNTER — Ambulatory Visit (INDEPENDENT_AMBULATORY_CARE_PROVIDER_SITE_OTHER): Payer: Medicare Other | Admitting: Vascular Surgery

## 2022-04-30 ENCOUNTER — Other Ambulatory Visit
Admission: RE | Admit: 2022-04-30 | Discharge: 2022-04-30 | Disposition: A | Discharge status: Home / Self Care | Payer: Medicare Other | Attending: Physician Assistant | Admitting: Physician Assistant

## 2022-04-30 ENCOUNTER — Ambulatory Visit (INDEPENDENT_AMBULATORY_CARE_PROVIDER_SITE_OTHER): Payer: Medicare Other

## 2022-04-30 ENCOUNTER — Ambulatory Visit: Payer: Medicare Other | Admitting: Physician Assistant

## 2022-04-30 VITALS — BP 134/74 | HR 66 | Resp 16 | Wt 150.4 lb

## 2022-04-30 DIAGNOSIS — I701 Atherosclerosis of renal artery: Secondary | ICD-10-CM

## 2022-04-30 DIAGNOSIS — I6523 Occlusion and stenosis of bilateral carotid arteries: Secondary | ICD-10-CM

## 2022-04-30 DIAGNOSIS — I2510 Atherosclerotic heart disease of native coronary artery without angina pectoris: Secondary | ICD-10-CM | POA: Diagnosis not present

## 2022-04-30 DIAGNOSIS — I890 Lymphedema, not elsewhere classified: Secondary | ICD-10-CM | POA: Diagnosis not present

## 2022-04-30 DIAGNOSIS — I5032 Chronic diastolic (congestive) heart failure: Secondary | ICD-10-CM | POA: Diagnosis not present

## 2022-04-30 DIAGNOSIS — I421 Obstructive hypertrophic cardiomyopathy: Secondary | ICD-10-CM | POA: Insufficient documentation

## 2022-04-30 DIAGNOSIS — I739 Peripheral vascular disease, unspecified: Secondary | ICD-10-CM | POA: Diagnosis not present

## 2022-04-30 DIAGNOSIS — I25119 Atherosclerotic heart disease of native coronary artery with unspecified angina pectoris: Secondary | ICD-10-CM | POA: Diagnosis not present

## 2022-04-30 LAB — BASIC METABOLIC PANEL
Anion gap: 6 (ref 5–15)
BUN: 16 mg/dL (ref 8–23)
CO2: 27 mmol/L (ref 22–32)
Calcium: 9.1 mg/dL (ref 8.9–10.3)
Chloride: 106 mmol/L (ref 98–111)
Creatinine, Ser: 1.01 mg/dL — ABNORMAL HIGH (ref 0.44–1.00)
GFR, Estimated: 53 mL/min — ABNORMAL LOW (ref >60)
Glucose, Bld: 165 mg/dL — ABNORMAL HIGH (ref 70–99)
Potassium: 4 mmol/L (ref 3.5–5.1)
Sodium: 139 mmol/L (ref 135–145)

## 2022-05-06 ENCOUNTER — Encounter (INDEPENDENT_AMBULATORY_CARE_PROVIDER_SITE_OTHER): Payer: Self-pay | Admitting: Vascular Surgery

## 2022-05-10 DIAGNOSIS — Z03818 Encounter for observation for suspected exposure to other biological agents ruled out: Secondary | ICD-10-CM | POA: Diagnosis not present

## 2022-05-10 DIAGNOSIS — J028 Acute pharyngitis due to other specified organisms: Secondary | ICD-10-CM | POA: Diagnosis not present

## 2022-05-10 DIAGNOSIS — B9789 Other viral agents as the cause of diseases classified elsewhere: Secondary | ICD-10-CM | POA: Diagnosis not present

## 2022-05-10 DIAGNOSIS — R059 Cough, unspecified: Secondary | ICD-10-CM | POA: Diagnosis not present

## 2022-05-10 DIAGNOSIS — R058 Other specified cough: Secondary | ICD-10-CM | POA: Diagnosis not present

## 2022-05-10 DIAGNOSIS — J069 Acute upper respiratory infection, unspecified: Secondary | ICD-10-CM | POA: Diagnosis not present

## 2022-05-15 ENCOUNTER — Other Ambulatory Visit: Payer: Self-pay | Admitting: Internal Medicine

## 2022-05-15 DIAGNOSIS — K219 Gastro-esophageal reflux disease without esophagitis: Secondary | ICD-10-CM

## 2022-05-21 ENCOUNTER — Encounter (INDEPENDENT_AMBULATORY_CARE_PROVIDER_SITE_OTHER): Payer: Medicare Other

## 2022-05-21 ENCOUNTER — Ambulatory Visit (INDEPENDENT_AMBULATORY_CARE_PROVIDER_SITE_OTHER): Payer: Medicare Other | Admitting: Vascular Surgery

## 2022-05-28 ENCOUNTER — Encounter (INDEPENDENT_AMBULATORY_CARE_PROVIDER_SITE_OTHER): Payer: Self-pay

## 2022-06-04 DIAGNOSIS — Z23 Encounter for immunization: Secondary | ICD-10-CM | POA: Diagnosis not present

## 2022-06-08 NOTE — Progress Notes (Unsigned)
Cardiology Office Note    Date:  06/12/2022   ID:  Kathryn Wise, DOB [birth year], MRN [id number]  PCP:  [name], MD  Cardiologist:  Nelva Bush, MD  Electrophysiologist:  None   Chief Complaint: Follow-up  History of Present Illness:   Kathryn Wise is a 88 y.o. female with history of nonobstructive CAD, HOCM, HFpEF, difficult to control hypertension with left renal artery stenosis status post stenting in 12/2018, carotid artery disease status post left sided stenting in 09/2020, possible small SDH noted in 10/2021, LBBB, HLD, and mitral regurgitation who presents for follow up of follow-up of CAD, HOCM, HFpEF, and HTN.   She was previously followed by Dr. [name] in 2017 for cardiac murmur with echo at that time demonstrating an EF of 60 to 65%, normal wall motion, mild focal basal and mild concentric LVH of the septum with mild LVOT gradient, grade 1 diastolic dysfunction, mildly dilated aortic root and ascending aorta, mild mitral regurgitation, normal RV systolic function and PASP.  She was admitted for chest pain in 05/2017 with Lexiscan being abnormal.  She subsequently underwent LHC in 05/2017, which demonstrated nonobstructive CAD involving the LCx and RCA with medical management recommended.  Echo in 05/2017 showed an EF of 55 to 60%, normal wall motion, mild focal basal and mild concentric LVH, near cavity obliteration in systole, grade 1 diastolic dysfunction, very mild aortic stenosis, moderate mitral regurgitation, mildly dilated left atrium, normal RV systolic function, and a PASP of 50 mmHg.  Following this, she was lost to follow-up.   She was admitted to the hospital in 09/2020 with pneumonia and HFpEF.  Echo demonstrated an EF of 55 to 60%, moderate LVH, grade 2 diastolic dysfunction with moderate LVOT obstruction with a peak gradient of 68 mmHg at rest and 74 mmHg with Valsalva, normal RV systolic function and ventricular cavity size, moderately elevated PASP estimated  at 46.5 mmHg, moderately dilated left atrium, mild mitral regurgitation, mild mitral stenosis, moderate mitral annular calcification, and mild to moderate aortic valve sclerosis without evidence of stenosis.  High-sensitivity troponin trended to 976.  She was lost to follow-up.   She was admitted in 09/2021 with hypertensive urgency with noted improvement in BP following 10 mg of amlodipine and 5 mg of IV labetalol.  She was noted to have a mildly elevated high-sensitivity troponin peaking at 36 felt to be related to supply demand ischemia from hypertension.   She was most recently admitted to the hospital in 10/2021 noting left upper extremity numbness that was transient, bilateral lower extremity swelling, and generalized weakness.  She was consulted on by neurosurgery for possible TIA.  MRI of the brain showed a possible 2 mm subdural hematoma overlying the right cerebral convexity.  This was not well visualized on CT imaging.  She was unaware of any fall/trauma.  There was recommended the patient continue aspirin without need for antiepileptic medication given lack of subdural hematoma noted on CT imaging.  Echo, as read by outside group, showed an EF of 50 to 55%, no regional wall motion normalities, mild concentric LVH, grade 1 diastolic dysfunction, normal RV systolic function and ventricular cavity size, myxomatous mitral valve with mild stenosis, and mild aortic insufficiency.  Lower extremity ultrasound was negative for DVT bilaterally.  High-sensitivity troponin 28 with a delta troponin of 29.  BNP 418.   She was seen in hospital follow-up on 11/20/2021 and was without symptoms of angina or decompensation at that time.  She did  report an approximate 4-week history of left jaw pain, and in the setting had multiple teeth extracted along that side without symptomatic improvement.  This discomfort was exacerbated by exertion, chewing, and yawning.  Subsequent Lexiscan MPI on 11/27/2021 was overall low risk  without evidence of infarction or ischemia.  CT attenuated corrected images showed mild coronary artery calcification and aortic atherosclerosis.  Echo on 12/07/2021 demonstrated an EF of 60 to 65%, no regional wall motion abnormalities, moderate LVH with severe basal septal hypertrophy with an LVOT gradient of 39 mmHg at rest and 52 mmHg with Valsalva with one measurement of 127 mmHg, normal RV systolic function and ventricular cavity size, mildly dilated left atrium, moderate mitral valve regurgitation with severe mitral annular calcification, and aortic valve sclerosis without evidence of stenosis.   She was seen in the office on 12/28/2021 and was without symptoms of angina or decompensation.  She did feel somewhat deconditioned as she had not been able to exercise regularly.  She was without symptoms of dizziness, presyncope, or syncope.  Her blood pressure was improved following the addition of Cardizem CD.  We subsequently titrated this to 240 mg daily.  She subsequently reported issues with pain all over, noting that slight "changes" led to a flareup of polymyalgia.  Given this, it was recommended she resume lower dose Cardizem 180 mg daily.  She subsequently discontinued this medication altogether indicating she was "feeling poorly."  She was later diagnosed with a sinus infection.  We recommended she resume Cardizem 180 mg daily due to elevated BP.   She was seen in the office in 01/2022 noting diffuse myalgias and limited mobility while on diltiazem.  She underwent intermittent washing out of this medication with improvement in symptoms while she was off diltiazem.  However, she did continue to take diltiazem with intermittent elevated blood pressure readings.  She requested a prednisone taper for her myalgias.  We transitioned her to low-dose carvedilol 3.125 mg twice daily.  She subsequently notified our office in 02/2022 that carvedilol was working well for her blood pressure, though causing fatigue and  weakness leading it to be discontinued.  She was placed back on amlodipine 10 mg daily, which she previously tolerated without issues.    She was last seen in the office in 03/2022 noting an increase in bilateral lower extremity swelling that began after reinitiating amlodipine.  She noticed an improvement in her energy following discontinuation of carvedilol.  She preferred to remain on amlodipine 10 mg daily.  She was started on furosemide 20 mg daily with follow-up labs showing stable renal function.  She comes in today doing very well from a cardiac perspective.  No symptoms of angina or decompensation.  Since her last visit, she reports an adjustment from her chiropractor and in this setting he noted resolution of her lower extremity swelling with normalization of her blood pressure.  Given this, she is not taking amlodipine and has not needed any as needed furosemide.  No dyspnea, palpitations, dizziness, presyncope, or syncope.  She remains active and is swimming on a regular basis.  With this, she does note some pedal cramping.  Otherwise, she is without complaints at this time.   Labs independently reviewed: 04/2022 - potassium 4.0, BUN 16, serum creatinine 1.01 02/2022 - TSH normal 10/2021 - TC 115, TG 91, HDL 42, LDL 55, albumin 4.1, AST/ALT normal, Hgb 12.3, PLT 181 08/2021 - A1c 6.4  Past Medical History:  Diagnosis Date   Arthritis    Collagen  vascular disease ([place])    Dysphagia, pharyngoesophageal phase 09/10/2013   Upper GI study with barium swallow was done at  [facility] Mar 2015   No reflux seen  Small irreducible hiatal hernia  Mild changes of presbyeophagus (abnormal contractions of the esophagus that occur with aging) No strictures Normal gastric emptying  Incomplete visualization of stomach fold due to patient's inability turn     GERD (gastroesophageal reflux disease)    Heart murmur    has had years and years   History of shingles Dec 2013   treated with steroids , post op from  shoulder surgery   History of squamous cell carcinoma 05/02/2016   right mid lateral pretibial   Hypertension    Hypothyroidism    Major depressive disorder, single episode 11/05/2015   Osteoarthritis of left shoulder 07/14/2012   Pneumonia 10/21/2020   Squamous cell carcinoma of skin 05/12/2016   R mid lat pretibial - other skin cancers treated by Dr. Sharlett Iles   Squamous cell carcinoma of skin 08/22/2020   left distal medial popliteal - [facility]    Past Surgical History:  Procedure Laterality Date   ARTHOSCOPIC ROTAOR CUFF REPAIR     LEFT  10+  YEARS     CAROTID PTA/STENT INTERVENTION Left 10/19/2020   Procedure: CAROTID PTA/STENT INTERVENTION;  Surgeon: Katha Cabal, MD;  Location: [place] [facility];  Service: Cardiovascular;  Laterality: Left;   CATARACT EXTRACTION W/ INTRAOCULAR LENS IMPLANT     RIGHT EYE   CATARACT EXTRACTION W/PHACO Left 03/28/2016   Procedure: CATARACT EXTRACTION PHACO AND INTRAOCULAR LENS PLACEMENT (IOC);  Surgeon: Leandrew Koyanagi, MD;  Location: [place];  Service: Ophthalmology;  Laterality: Left;  TORIC   FACELIFT     FOOT SURGERY     rt foot   TUMOR REMOVED   JOINT REPLACEMENT Left Dec 2013   shoulder   LEFT HEART CATH AND CORONARY ANGIOGRAPHY N/A 06/12/2017   Procedure: LEFT HEART CATH AND CORONARY ANGIOGRAPHY;  Surgeon: Minna Merritts, MD;  Location: [place] [facility];  Service: Cardiovascular;  Laterality: N/A;   RENAL ANGIOGRAPHY Left 01/07/2019   Procedure: RENAL ANGIOGRAPHY;  Surgeon: Katha Cabal, MD;  Location: [place] [facility];  Service: Cardiovascular;  Laterality: Left;   RENAL ARTERY STENT     SKIN CANCER EXCISION     TOTAL SHOULDER ARTHROPLASTY  07/14/2012   Procedure: TOTAL SHOULDER ARTHROPLASTY;  Surgeon: Johnny Bridge, MD;  Location: [facility];  Service: Orthopedics;  Laterality: Left;    Current Medications: Current Meds  Medication Sig   aspirin EC 81 MG tablet Take 81 mg by mouth daily.    Cholecalciferol (VITAMIN D-3) 1000 units CAPS Take 1,000 Units by mouth daily.   ketoconazole (NIZORAL) 2 % cream Apply 1 Application topically at bedtime. Apply to the feet and between the toes every night.   levothyroxine (SYNTHROID) 88 MCG tablet TAKE 1 TABLET(88 MCG) BY MOUTH DAILY BEFORE BREAKFAST   omeprazole (PRILOSEC) 20 MG capsule TAKE 1 CAPSULE(20 MG) BY MOUTH EVERY MORNING AS NEEDED FOR HEARTBURN   rosuvastatin (CRESTOR) 10 MG tablet Take 1 tablet (10 mg total) by mouth at bedtime.   temazepam (RESTORIL) 15 MG capsule TAKE 1 CAPSULE BY MOUTH AT BEDTIME AS NEEDED SLEEP   Turmeric 500 MG CAPS Take 500 mg by mouth daily.    Allergies:   Alendronate, Codeine, Lisinopril, Metoprolol, Oxycodone, and Oxycontin [oxycodone hcl]   Social History   Socioeconomic History   Marital status: Widowed  Spouse name: Not on file   Number of children: Not on file   Years of education: Not on file   Highest education level: Not on file  Occupational History   Not on file  Tobacco Use   Smoking status: Former    Types: Cigarettes    Quit date: 07/30/1972    Years since quitting: 49.9   Smokeless tobacco: Never   Tobacco comments:    smoked for only a few months  Vaping Use   Vaping Use: Never used  Substance and Sexual Activity   Alcohol use: Yes    Comment: OCCAS WINE    Drug use: No   Sexual activity: Never  Other Topics Concern   Not on file  Social History Narrative   Not on file   Social Determinants of Health   Financial Resource Strain: Low Risk  (04/20/2020)   Overall Financial Resource Strain (CARDIA)    Difficulty of Paying Living Expenses: Not hard at all  Food Insecurity: No Food Insecurity (04/20/2020)   Hunger Vital Sign    Worried About Running Out of Food in the Last Year: Never true    [place] in the Last Year: Never true  Transportation Needs: No Transportation Needs (04/20/2020)   PRAPARE - Hydrologist (Medical): No     Lack of Transportation (Non-Medical): No  Physical Activity: Sufficiently Active (04/20/2020)   Exercise Vital Sign    Days of Exercise per Week: 4 days    Minutes of Exercise per Session: 60 min  Stress: No Stress Concern Present (04/20/2020)   [place]    Feeling of Stress : Not at all  Social Connections: Unknown (04/20/2020)   Social Connection and Isolation Panel [NHANES]    Frequency of Communication with Friends and Family: More than three times a week    Frequency of Social Gatherings with Friends and Family: Not on file    Attends Religious Services: Not on file    Active Member of Clubs or Organizations: Not on file    Attends Archivist Meetings: Not on file    Marital Status: Not on file     Family History:  The patient's family history includes Breast cancer (age of onset: 40) in her daughter; Cancer in her daughter and son.  ROS:   12-point review of systems is negative unless otherwise noted in the HPI   EKGs/Labs/Other Studies Reviewed:    Studies reviewed were summarized above. The additional studies were reviewed today:  Limited echo 12/07/2021: 1. Left ventricular ejection fraction, by estimation, is 60 to 65%. The  left ventricle has normal function. The left ventricle has no regional  wall motion abnormalities. There is moderat   2. There is moderate left ventricular hypertrophy with severe basal  septal hypertrophy. LVOT gradient noted, 39 mm Hg at rest, up to 52 mm Hg  with valsalva. One measurement estimating gradient at 127 mm Hg.   3. Right ventricular systolic function is normal. The right ventricular  size is normal. Tricuspid regurgitation signal is inadequate for assessing  PA pressure.   4. Left atrial size was mildly dilated.   5. The mitral valve is normal in structure. Moderate mitral valve  regurgitation. Severe mitral annular calcification.   6. The aortic valve is  normal in structure. Aortic valve regurgitation is  not visualized. Aortic valve sclerosis/calcification is present, without  any evidence of aortic stenosis.  Comparison(s): 11/13/21 echo measured LVOT Vmax as 156 cm/s. __________   [facility] MPI 11/27/2021:   The study is normal. The study is low risk.   No ST deviation was noted.   LV perfusion is normal. There is no evidence of ischemia. There is no evidence of infarction.   Left ventricular function is normal. End diastolic cavity size is normal. End systolic cavity size is normal.   Suboptimal study due to GI uptake.   CT attenuation images with mild aortic and coronary calcification. __________   2D echo 11/13/2021:  1. Left ventricular ejection fraction, by estimation, is 50 to 55%. The  left ventricle has low normal function. The left ventricle has no regional  wall motion abnormalities. There is mild concentric left ventricular  hypertrophy. Left ventricular  diastolic parameters are consistent with Grade I diastolic dysfunction  (impaired relaxation).   2. Right ventricular systolic function is normal. The right ventricular  size is normal.   3. The mitral valve is myxomatous. No evidence of mitral valve  regurgitation. Mild mitral stenosis.   4. The aortic valve is normal in structure. Aortic valve regurgitation is  mild. __________   2D echo 06/26/2017: - Left ventricle: The cavity size was normal. Near cavity    obliteration in systole. There was mild focal basal and mild    concentric hypertrophy of the septum. Systolic function was    normal. The estimated ejection fraction was in the range of 55%    to 60%. Wall motion was normal; there were no regional wall    motion abnormalities. Doppler parameters are consistent with    abnormal left ventricular relaxation (grade 1 diastolic    dysfunction).  - Aortic valve: Transvalvular velocity was increased. There was    very mild stenosis.  - Mitral valve: Calcified  annulus. There was moderate    regurgitation.  - Left atrium: The atrium was mildly dilated.  - Right ventricle: Systolic function was normal.  - Pulmonary arteries: Systolic pressure was moderately elevated. PA    peak pressure: 50 mm Hg (S). __________  [facility] 06/12/2017: Coronary dominance: Right  Left mainstem:   Large vessel that bifurcates into the LAD and left circumflex, Ostial 30% disease, calcified.  Left anterior descending (LAD):   Large vessel that extends to the apical region, diagonal branch 2 of moderate size, mild lumenal irregularities  Left circumflex (LCx):  Large vessel with OM branch 2, moderate 50% mid vessel disease, calcified  Right coronary artery (RCA):  Right dominant vessel with PL and PDA, moderate focal disease, eccentric 50 to 60%  Left ventriculography: Left ventricular systolic function is normal/hyperdynamic, LVEF is estimated at 55-65%, there is no significant mitral regurgitation , no significant aortic valve stenosis  Final Conclusions:   Moderate mid LCX and RCA disease, Medical management recommended Normal/hyperdynamic function  Recommendations:  Follow up on echocardiogram Start metoprolol tartrate 25 mg po BID __________   [facility] MPI 06/11/2017: Abnormal, potentially high risk myocardial perfusion stress test. There is a moderate in size, mild in severity, reversible defect involving the mid inferolateral, apical lateral, and apical segments consistent with ischemia. There is a small in size, mild in severity, reversible defect involving the mid anterior segment, which may represent subtle ischemia and/or shifting breast attenuation. The left ventricular ejection fraction is normal (64%). Transient ischemic dilation is noted (TID 1.31), which is non-specific but can be seen balanced/multivessel ischemia. __________   2D echo 12/05/2015: - Left ventricle: The cavity size was normal. There  was mild focal    basal and mild concentric  hypertrophy of the septum, with mild    LVOT gradient. Systolic function was normal. The estimated    ejection fraction was in the range of 60% to 65%. Wall motion was    normal; there were no regional wall motion abnormalities. Doppler    parameters are consistent with abnormal left ventricular    relaxation (grade 1 diastolic dysfunction).  - Aorta: Aortic root with milldy dilated, dimension: 35 mm (ED).    Ascending aorta was mildly dilated, diameter: 35 mm (S).  - Mitral valve: There was mild regurgitation.  - Left atrium: The atrium was normal in size.  - Right ventricle: Systolic function was normal.  - Pulmonary arteries: Systolic pressure was within the normal    range.  - Inferior vena cava: The vessel was normal in size. The    respirophasic diameter changes were in the normal range (>= 50%),    consistent with normal central venous pressure.   Impressions:   - Murmur likely secondary to aortic valve sclerosis without    significant stenosis, and mild LVOT gradient.  EKG:  EKG is not ordered today.    Recent Labs: 09/26/2021: TSH 1.49 11/13/2021: ALT 14; B Natriuretic Peptide 418.1; Hemoglobin 12.3; Platelets 181 04/30/2022: BUN 16; Creatinine, Ser 1.01; Potassium 4.0; Sodium 139  Recent Lipid Panel    Component Value Date/Time   CHOL 115 11/14/2021 0538   TRIG 91 11/14/2021 0538   HDL 42 11/14/2021 0538   CHOLHDL 2.7 11/14/2021 0538   VLDL 18 11/14/2021 0538   LDLCALC 55 11/14/2021 0538   LDLDIRECT 126.0 09/19/2016 1411    PHYSICAL EXAM:    VS:  BP 122/66 (BP Location: Left Arm, Patient Position: Sitting, Cuff Size: Normal)   Pulse 79   Ht '5\' 6"'$  (1.676 m)   Wt 151 lb (68.5 kg)   SpO2 97%   BMI 24.37 kg/m   BMI: Body mass index is 24.37 kg/m.  Physical Exam Vitals reviewed.  Constitutional:      Appearance: She is well-developed.  HENT:     Head: Normocephalic and atraumatic.  Eyes:     General:        Right eye: No discharge.        Left eye: No  discharge.  Neck:     Vascular: No JVD.  Cardiovascular:     Rate and Rhythm: Normal rate and regular rhythm.     Pulses:          Posterior tibial pulses are 2+ on the right side and 2+ on the left side.     Heart sounds: S1 normal and S2 normal. Heart sounds not distant. No midsystolic click and no opening snap. Murmur heard.     Systolic murmur is present with a grade of 2/6.     No friction rub.  Pulmonary:     Effort: Pulmonary effort is normal. No respiratory distress.     Breath sounds: Normal breath sounds. No decreased breath sounds, wheezing or rales.  Chest:     Chest wall: No tenderness.  Abdominal:     General: There is no distension.  Musculoskeletal:     Cervical back: Normal range of motion.     Right lower leg: No edema.     Left lower leg: No edema.  Skin:    General: Skin is warm and dry.     Nails: There is no clubbing.  Neurological:  Mental Status: She is alert and oriented to person, place, and time.  Psychiatric:        Speech: Speech normal.        Behavior: Behavior normal.        Thought Content: Thought content normal.        Judgment: Judgment normal.     Wt Readings from Last 3 Encounters:  06/12/22 151 lb (68.5 kg)  04/30/22 150 lb 6.4 oz (68.2 kg)  04/09/22 151 lb (68.5 kg)     ASSESSMENT & PLAN:   Nonobstructive CAD: No symptoms suggestive of angina or decompensation.  Lexiscan MPI earlier this year showed no evidence of ischemia and was overall low risk.  Continue aggressive risk factor modification and current medical therapy.  No indication for further ischemic testing at this time.  HOCM: No symptoms of dizziness, presyncope, or syncope.  Previously, she has not tolerated carvedilol, metoprolol, or diltiazem secondary to myalgias and fatigue.  She has indicated first-degree relatives have been screened.  Given advanced age and comorbid conditions, she prefers to follow a conservative approach rather than referral to the [place] clinic  or consideration of myomectomy.  For these reasons, we will defer referral to EP for consideration of ICD (patient is a DNR), and cardiac MRI.  Not on beta-blocker or nondihydropyridine calcium channel blocker as outlined above.  HFpEF: She is well compensated and euvolemic.  Not requiring a standing loop diuretic.  HTN: Blood pressure is well controlled in the office today.  Not requiring medical therapy at this time.  Mitral valve regurgitation: Stable echo in 11/2021.  LBBB: No symptoms of near syncope or syncope.  Carotid artery disease: Status post left ICA stenting in 09/2020 with noninvasive imaging from 04/2022 demonstrating bilateral 1 to 39% ICA stenosis.  She remains on aspirin and rosuvastatin.  Followed by vascular surgery.  Renal artery stenosis: Status post right renal artery stenting with most recent ultrasound in 04/2022 showing a patent right renal artery stent with no evidence of renal artery stenosis bilaterally.  Followed by vascular surgery.  Possible SDH: Evaluated by neurosurgery with no intervention indicated.    Disposition: F/u with Dr. Saunders Revel or an APP in 6 months.   Medication Adjustments/Labs and Tests Ordered: Current medicines are reviewed at length with the patient today.  Concerns regarding medicines are outlined above. Medication changes, Labs and Tests ordered today are summarized above and listed in the Patient Instructions accessible in Encounters.   Signed, Christell Faith, PA-C 06/12/2022 3:32 PM     [facility] [address] [place] [address] [place], [place] [address] [phone]

## 2022-06-12 ENCOUNTER — Ambulatory Visit: Payer: Medicare Other | Attending: Physician Assistant | Admitting: Physician Assistant

## 2022-06-12 ENCOUNTER — Encounter: Payer: Self-pay | Admitting: Physician Assistant

## 2022-06-12 VITALS — BP 122/66 | HR 79 | Ht 66.0 in | Wt 151.0 lb

## 2022-06-12 DIAGNOSIS — I701 Atherosclerosis of renal artery: Secondary | ICD-10-CM | POA: Diagnosis not present

## 2022-06-12 DIAGNOSIS — I5032 Chronic diastolic (congestive) heart failure: Secondary | ICD-10-CM

## 2022-06-12 DIAGNOSIS — I447 Left bundle-branch block, unspecified: Secondary | ICD-10-CM | POA: Diagnosis not present

## 2022-06-12 DIAGNOSIS — I10 Essential (primary) hypertension: Secondary | ICD-10-CM | POA: Diagnosis not present

## 2022-06-12 DIAGNOSIS — I6522 Occlusion and stenosis of left carotid artery: Secondary | ICD-10-CM | POA: Diagnosis not present

## 2022-06-12 DIAGNOSIS — I2510 Atherosclerotic heart disease of native coronary artery without angina pectoris: Secondary | ICD-10-CM | POA: Diagnosis not present

## 2022-06-12 DIAGNOSIS — S065XAA Traumatic subdural hemorrhage with loss of consciousness status unknown, initial encounter: Secondary | ICD-10-CM | POA: Insufficient documentation

## 2022-06-12 DIAGNOSIS — S065XAD Traumatic subdural hemorrhage with loss of consciousness status unknown, subsequent encounter: Secondary | ICD-10-CM

## 2022-06-12 DIAGNOSIS — I059 Rheumatic mitral valve disease, unspecified: Secondary | ICD-10-CM

## 2022-06-12 DIAGNOSIS — I421 Obstructive hypertrophic cardiomyopathy: Secondary | ICD-10-CM | POA: Diagnosis not present

## 2022-06-12 NOTE — Patient Instructions (Signed)
Medication Instructions:  No changes at this time.   *If you need a refill on your cardiac medications before your next appointment, please call your pharmacy*   Lab Work: None  If you have labs (blood work) drawn today and your tests are completely normal, you will receive your results only by: [place] (if you have MyChart) OR A paper copy in the mail If you have any lab test that is abnormal or we need to change your treatment, we will call you to review the results.   Testing/Procedures: None   Follow-Up: At [facility], you and your health needs are our priority.  As part of our continuing mission to provide you with exceptional heart care, we have created designated Provider Care Teams.  These Care Teams include your primary Cardiologist (physician) and Advanced Practice Providers (APPs -  Physician Assistants and Nurse Practitioners) who all work together to provide you with the care you need, when you need it.   Your next appointment:   6 month(s)  The format for your next appointment:   In Person  Provider:   Nelva Bush, MD or Christell Faith, PA-C       Important Information About Sugar

## 2022-06-13 ENCOUNTER — Other Ambulatory Visit: Payer: Self-pay | Admitting: Internal Medicine

## 2022-06-13 DIAGNOSIS — K219 Gastro-esophageal reflux disease without esophagitis: Secondary | ICD-10-CM

## 2022-06-14 ENCOUNTER — Encounter (INDEPENDENT_AMBULATORY_CARE_PROVIDER_SITE_OTHER): Payer: Medicare Other

## 2022-06-14 ENCOUNTER — Ambulatory Visit (INDEPENDENT_AMBULATORY_CARE_PROVIDER_SITE_OTHER): Payer: Medicare Other | Admitting: Vascular Surgery

## 2022-07-09 ENCOUNTER — Other Ambulatory Visit: Payer: Self-pay | Admitting: Physician Assistant

## 2022-08-17 ENCOUNTER — Emergency Department: Payer: Medicare Other

## 2022-08-17 ENCOUNTER — Other Ambulatory Visit: Payer: Self-pay

## 2022-08-17 ENCOUNTER — Inpatient Hospital Stay
Admission: EM | Admit: 2022-08-17 | Discharge: 2022-08-18 | DRG: 303 | Disposition: A | Discharge status: Home / Self Care | Payer: Medicare Other | Attending: Family Medicine | Admitting: Family Medicine

## 2022-08-17 DIAGNOSIS — I479 Paroxysmal tachycardia, unspecified: Secondary | ICD-10-CM

## 2022-08-17 DIAGNOSIS — R079 Chest pain, unspecified: Secondary | ICD-10-CM | POA: Diagnosis not present

## 2022-08-17 DIAGNOSIS — Z87891 Personal history of nicotine dependence: Secondary | ICD-10-CM | POA: Diagnosis not present

## 2022-08-17 DIAGNOSIS — M1990 Unspecified osteoarthritis, unspecified site: Secondary | ICD-10-CM | POA: Diagnosis present

## 2022-08-17 DIAGNOSIS — Z79890 Hormone replacement therapy (postmenopausal): Secondary | ICD-10-CM

## 2022-08-17 DIAGNOSIS — I340 Nonrheumatic mitral (valve) insufficiency: Secondary | ICD-10-CM | POA: Diagnosis present

## 2022-08-17 DIAGNOSIS — Z885 Allergy status to narcotic agent status: Secondary | ICD-10-CM

## 2022-08-17 DIAGNOSIS — R202 Paresthesia of skin: Secondary | ICD-10-CM | POA: Diagnosis not present

## 2022-08-17 DIAGNOSIS — R002 Palpitations: Secondary | ICD-10-CM | POA: Diagnosis not present

## 2022-08-17 DIAGNOSIS — R42 Dizziness and giddiness: Secondary | ICD-10-CM | POA: Diagnosis not present

## 2022-08-17 DIAGNOSIS — I421 Obstructive hypertrophic cardiomyopathy: Secondary | ICD-10-CM | POA: Diagnosis present

## 2022-08-17 DIAGNOSIS — I5032 Chronic diastolic (congestive) heart failure: Secondary | ICD-10-CM | POA: Diagnosis not present

## 2022-08-17 DIAGNOSIS — Z7982 Long term (current) use of aspirin: Secondary | ICD-10-CM

## 2022-08-17 DIAGNOSIS — Z888 Allergy status to other drugs, medicaments and biological substances status: Secondary | ICD-10-CM

## 2022-08-17 DIAGNOSIS — Z85828 Personal history of other malignant neoplasm of skin: Secondary | ICD-10-CM

## 2022-08-17 DIAGNOSIS — R7303 Prediabetes: Secondary | ICD-10-CM | POA: Diagnosis present

## 2022-08-17 DIAGNOSIS — I25119 Atherosclerotic heart disease of native coronary artery with unspecified angina pectoris: Secondary | ICD-10-CM

## 2022-08-17 DIAGNOSIS — R0789 Other chest pain: Secondary | ICD-10-CM | POA: Diagnosis not present

## 2022-08-17 DIAGNOSIS — I480 Paroxysmal atrial fibrillation: Secondary | ICD-10-CM | POA: Diagnosis present

## 2022-08-17 DIAGNOSIS — I10 Essential (primary) hypertension: Secondary | ICD-10-CM | POA: Diagnosis not present

## 2022-08-17 DIAGNOSIS — D509 Iron deficiency anemia, unspecified: Secondary | ICD-10-CM | POA: Diagnosis not present

## 2022-08-17 DIAGNOSIS — I999 Unspecified disorder of circulatory system: Secondary | ICD-10-CM | POA: Diagnosis present

## 2022-08-17 DIAGNOSIS — E039 Hypothyroidism, unspecified: Secondary | ICD-10-CM | POA: Diagnosis not present

## 2022-08-17 DIAGNOSIS — I739 Peripheral vascular disease, unspecified: Secondary | ICD-10-CM | POA: Diagnosis not present

## 2022-08-17 DIAGNOSIS — R7989 Other specified abnormal findings of blood chemistry: Secondary | ICD-10-CM | POA: Diagnosis present

## 2022-08-17 DIAGNOSIS — I2510 Atherosclerotic heart disease of native coronary artery without angina pectoris: Secondary | ICD-10-CM | POA: Diagnosis not present

## 2022-08-17 DIAGNOSIS — I4719 Other supraventricular tachycardia: Secondary | ICD-10-CM | POA: Diagnosis present

## 2022-08-17 DIAGNOSIS — I447 Left bundle-branch block, unspecified: Secondary | ICD-10-CM | POA: Diagnosis present

## 2022-08-17 DIAGNOSIS — I110 Hypertensive heart disease with heart failure: Secondary | ICD-10-CM | POA: Diagnosis not present

## 2022-08-17 DIAGNOSIS — R0989 Other specified symptoms and signs involving the circulatory and respiratory systems: Secondary | ICD-10-CM

## 2022-08-17 DIAGNOSIS — I2489 Other forms of acute ischemic heart disease: Secondary | ICD-10-CM | POA: Diagnosis present

## 2022-08-17 DIAGNOSIS — I701 Atherosclerosis of renal artery: Secondary | ICD-10-CM | POA: Diagnosis not present

## 2022-08-17 DIAGNOSIS — K219 Gastro-esophageal reflux disease without esophagitis: Secondary | ICD-10-CM | POA: Diagnosis present

## 2022-08-17 DIAGNOSIS — R55 Syncope and collapse: Secondary | ICD-10-CM

## 2022-08-17 DIAGNOSIS — Z96612 Presence of left artificial shoulder joint: Secondary | ICD-10-CM | POA: Diagnosis present

## 2022-08-17 DIAGNOSIS — I4892 Unspecified atrial flutter: Secondary | ICD-10-CM | POA: Diagnosis present

## 2022-08-17 DIAGNOSIS — I5030 Unspecified diastolic (congestive) heart failure: Secondary | ICD-10-CM | POA: Insufficient documentation

## 2022-08-17 DIAGNOSIS — I6523 Occlusion and stenosis of bilateral carotid arteries: Secondary | ICD-10-CM | POA: Diagnosis not present

## 2022-08-17 DIAGNOSIS — Z79899 Other long term (current) drug therapy: Secondary | ICD-10-CM | POA: Diagnosis not present

## 2022-08-17 DIAGNOSIS — E119 Type 2 diabetes mellitus without complications: Secondary | ICD-10-CM

## 2022-08-17 DIAGNOSIS — R531 Weakness: Secondary | ICD-10-CM | POA: Diagnosis not present

## 2022-08-17 DIAGNOSIS — R072 Precordial pain: Secondary | ICD-10-CM | POA: Diagnosis present

## 2022-08-17 LAB — BASIC METABOLIC PANEL
Anion gap: 10 (ref 5–15)
BUN: 12 mg/dL (ref 8–23)
CO2: 23 mmol/L (ref 22–32)
Calcium: 9.1 mg/dL (ref 8.9–10.3)
Chloride: 104 mmol/L (ref 98–111)
Creatinine, Ser: 0.77 mg/dL (ref 0.44–1.00)
GFR, Estimated: >60 mL/min (ref >60)
Glucose, Bld: 116 mg/dL — ABNORMAL HIGH (ref 70–99)
Potassium: 3.8 mmol/L (ref 3.5–5.1)
Sodium: 137 mmol/L (ref 135–145)

## 2022-08-17 LAB — D-DIMER, QUANTITATIVE: D-Dimer, Quant: 1.22 ug/mL-FEU — ABNORMAL HIGH (ref 0.00–0.50)

## 2022-08-17 LAB — CBC
HCT: 40.1 % (ref 36.0–46.0)
Hemoglobin: 12.7 g/dL (ref 12.0–15.0)
MCH: 27.4 pg (ref 26.0–34.0)
MCHC: 31.7 g/dL (ref 30.0–36.0)
MCV: 86.6 fL (ref 80.0–100.0)
Platelets: 149 10*3/uL — ABNORMAL LOW (ref 150–400)
RBC: 4.63 MIL/uL (ref 3.87–5.11)
RDW: 14.7 % (ref 11.5–15.5)
WBC: 8.3 10*3/uL (ref 4.0–10.5)
nRBC: 0 % (ref 0.0–0.2)

## 2022-08-17 LAB — TROPONIN I (HIGH SENSITIVITY)
Troponin I (High Sensitivity): 277 ng/L (ref <18)
Troponin I (High Sensitivity): 286 ng/L (ref <18)
Troponin I (High Sensitivity): 305 ng/L (ref <18)

## 2022-08-17 LAB — TSH: TSH: 1.061 u[IU]/mL (ref 0.350–4.500)

## 2022-08-17 LAB — MAGNESIUM: Magnesium: 1.9 mg/dL (ref 1.7–2.4)

## 2022-08-17 LAB — T4, FREE: Free T4: 1.19 ng/dL — ABNORMAL HIGH (ref 0.61–1.12)

## 2022-08-17 LAB — BRAIN NATRIURETIC PEPTIDE: B Natriuretic Peptide: 710.6 pg/mL — ABNORMAL HIGH (ref 0.0–100.0)

## 2022-08-17 MED ORDER — BISOPROLOL FUMARATE 5 MG PO TABS
5.0000 mg | ORAL_TABLET | ORAL | Status: AC
  Administered 2022-08-17: 5 mg via ORAL
  Filled 2022-08-17: qty 1

## 2022-08-17 MED ORDER — ROSUVASTATIN CALCIUM 10 MG PO TABS
10.0000 mg | ORAL_TABLET | Freq: Every day | ORAL | Status: DC
  Administered 2022-08-17: 10 mg via ORAL
  Filled 2022-08-17 (×2): qty 1

## 2022-08-17 MED ORDER — BISOPROLOL FUMARATE 5 MG PO TABS
5.0000 mg | ORAL_TABLET | Freq: Every day | ORAL | 0 refills | Status: DC
Start: 2022-08-17 — End: 2022-08-19

## 2022-08-17 MED ORDER — INSULIN ASPART 100 UNIT/ML IJ SOLN: 0.0000 [IU] | Freq: Three times a day (TID) | INTRAMUSCULAR | Status: DC

## 2022-08-17 MED ORDER — INSULIN ASPART 100 UNIT/ML IJ SOLN: 0.0000 [IU] | Freq: Every day | INTRAMUSCULAR | Status: DC

## 2022-08-17 MED ORDER — LEVOTHYROXINE SODIUM 88 MCG PO TABS
88.0000 ug | ORAL_TABLET | Freq: Every day | ORAL | Status: DC
  Administered 2022-08-17: 88 ug via ORAL
  Filled 2022-08-17 (×2): qty 1

## 2022-08-17 MED ORDER — ACETAMINOPHEN 325 MG RE SUPP: 650.0000 mg | Freq: Four times a day (QID) | RECTAL | Status: DC | PRN

## 2022-08-17 MED ORDER — ASPIRIN 81 MG PO TBEC
81.0000 mg | DELAYED_RELEASE_TABLET | Freq: Every day | ORAL | Status: DC
  Administered 2022-08-18: 81 mg via ORAL
  Filled 2022-08-17: qty 1

## 2022-08-17 MED ORDER — HYDRALAZINE HCL 50 MG PO TABS
25.0000 mg | ORAL_TABLET | ORAL | Status: AC
  Administered 2022-08-17: 25 mg via ORAL
  Filled 2022-08-17: qty 1

## 2022-08-17 MED ORDER — ACETAMINOPHEN 325 MG PO TABS: 650.0000 mg | ORAL_TABLET | Freq: Four times a day (QID) | ORAL | Status: DC | PRN

## 2022-08-17 MED ORDER — TEMAZEPAM 15 MG PO CAPS
15.0000 mg | ORAL_CAPSULE | Freq: Every evening | ORAL | Status: DC | PRN
  Administered 2022-08-17: 15 mg via ORAL
  Filled 2022-08-17: qty 1

## 2022-08-17 MED ORDER — PANTOPRAZOLE SODIUM 40 MG IV SOLR
40.0000 mg | Freq: Two times a day (BID) | INTRAVENOUS | Status: DC
  Administered 2022-08-17 – 2022-08-18 (×2): 40 mg via INTRAVENOUS
  Filled 2022-08-17 (×2): qty 10

## 2022-08-17 MED ORDER — NITROGLYCERIN 0.4 MG SL SUBL: 0.4000 mg | SUBLINGUAL_TABLET | SUBLINGUAL | Status: DC | PRN

## 2022-08-17 NOTE — Assessment & Plan Note (Addendum)
PPI therapy.

## 2022-08-17 NOTE — Assessment & Plan Note (Addendum)
we will cont pt on bisoprolol. Pt to verify if her allergies are True allergies.  She will call her daughter.  She has h/o SDH and anticoagulation is high risk of  bleeding.

## 2022-08-17 NOTE — ED Notes (Signed)
Called pharmacy to get a med rec to get patients nighttime medications since she is worried to mess up her medications. Pharmacy previously talked to granddaughter earlier in the night about patients medications she takes on a regular bases.

## 2022-08-17 NOTE — Assessment & Plan Note (Addendum)
Suspect demand ischemia.  We will consult cardiology for eval and management.

## 2022-08-17 NOTE — ED Notes (Signed)
Pt ambulatory to the toilet with 1 assist with steady gait.

## 2022-08-17 NOTE — Assessment & Plan Note (Addendum)
Consistent carb diet.  Accucheck QAC/TID.

## 2022-08-17 NOTE — ED Provider Notes (Signed)
[facility] Provider Note    Event Date/Time   First MD Initiated Contact with Patient 08/17/22 1509     (approximate)   History   Chief Complaint: Chest Pain   HPI  ESMAE DONATHAN is a 89 y.o. female with a history of hypertension, GERD, HFpEF, HOCM who comes the ED complaining of chest tightness associated with near syncope that started around 12:00 noon today while at a hair salon.  She felt like her heart was racing as well.  The episode resolved after a minute or so so she went home, where it happened again.  She activated her medical response service who contacted EMS.  She was given 324 mg of aspirin prior to arrival.  On arrival symptoms are resolved, but on the monitor she is seen to have episodes of tachycardia which correspond to recurrence of her symptoms.  Symptoms resolved when she is back in her usual normal heart rate.  Denies any recent illness.  States that she has been struggling with blood pressure control lately, which is normally managed with as needed amlodipine.  Her PCP recommended she try hydralazine which she has not yet done.  She follows up with [facility] cardiology.  Reviewed outside records including cardiology notes, noting she has previously been tried on metoprolol, diltiazem, carvedilol, but patient was dissatisfied with side effects of fatigue.  Last heart catheterization was 2018, showing multivessel moderate disease and recommended for medical management.        Physical Exam   Triage Vital Signs: ED Triage Vitals  Enc Vitals Group     BP 08/17/22 1512 (!) 100/53     Pulse Rate 08/17/22 1512 70     Resp 08/17/22 1512 18     Temp 08/17/22 1514 98.1 F (36.7 C)     Temp Source 08/17/22 1514 Oral     SpO2 08/17/22 1512 97 %     Weight 08/17/22 1512 151 lb 0.2 oz (68.5 kg)     Height 08/17/22 1512 '5\' 6"'$  (1.676 m)     Head Circumference --      Peak Flow --      Pain Score 08/17/22 1512 6     Pain Loc --      Pain Edu?  --      Excl. in [place]? --     Most recent vital signs: Vitals:   08/17/22 1700 08/17/22 1730  BP: (!) 149/82 136/78  Pulse: 77 73  Resp: (!) 22 19  Temp:    SpO2: 98% 96%    General: Awake, no distress.  CV:  Good peripheral perfusion.  Regular rate and rhythm, normal distal pulses.  No murmurs. Resp:  Normal effort.  Clear to auscultation bilaterally Abd:  No distention.  Soft nontender Other:  Trace pitting edema bilateral lower extremities, symmetric.  No calf tenderness.   ED Results / Procedures / Treatments   Labs (all labs ordered are listed, but only abnormal results are displayed) Labs Reviewed  BASIC METABOLIC PANEL - Abnormal; Notable for the following components:      Result Value   Glucose, Bld 116 (*)    All other components within normal limits  CBC - Abnormal; Notable for the following components:   Platelets 149 (*)    All other components within normal limits  T4, FREE - Abnormal; Notable for the following components:   Free T4 1.19 (*)    All other components within normal limits  TROPONIN I (HIGH  SENSITIVITY) - Abnormal; Notable for the following components:   Troponin I (High Sensitivity) 277 (*)    All other components within normal limits  TROPONIN I (HIGH SENSITIVITY) - Abnormal; Notable for the following components:   Troponin I (High Sensitivity) 305 (*)    All other components within normal limits  TSH  MAGNESIUM  TROPONIN I (HIGH SENSITIVITY)     EKG EKG interpreted by me, shows sinus rhythm, rate of 73.  Normal axis, left bundle branch block.  No acute ischemic changes.  Repeat EKG performed at 3:16 PM shows atrial flutter, rate of 136.  Unchanged QRS morphology consistent with underlying left bundle branch block.   RADIOLOGY Chest x-ray interpreted by me, appears unremarkable.  Radiology report reviewed.   PROCEDURES:  Procedures   MEDICATIONS ORDERED IN ED: Medications  bisoprolol (ZEBETA) tablet 5 mg (5 mg Oral Given  08/17/22 1718)  hydrALAZINE (APRESOLINE) tablet 25 mg (25 mg Oral Given 08/17/22 1644)     IMPRESSION / MDM / ASSESSMENT AND PLAN / ED COURSE  I reviewed the triage vital signs and the nursing notes.                              Differential diagnosis includes, but is not limited to, paroxysmal dysrhythmia, electrolyte abnormality, nstemi, anemia, AKI, hypothyroidism  Patient's presentation is most consistent with acute presentation with potential threat to life or bodily function.  Pt p/w episodes of chest tightness and lightheadedness that corresponded to paroxysmal tachycardia which appears to be atrial flutter on EKG with her underlying left bundle branch block.  Blood pressure is also about 190/100.  In her sinus rhythm, she is asymptomatic.  Will check labs including electrolytes, thyroid function, troponins.  Case discussed with her cardiology team at [facility], Dr. Rockey Situ recommends bisoprolol 5 mg daily with 1 extra per day as needed to control her rhythm and see if this is better tolerated from a side effect perspective.  Will also give her a dose of hydralazine for her hypertension.    ----------------------------------------- 6:45 PM on 08/17/2022 -----------------------------------------  Blood pressure improved to about 135/80.  Heart rate is remaining around 70 in sinus rhythm, but she is still having episodes of tachycardia associated with near syncope and precordial pain.  Will need to hospitalize for further telemetry monitoring due to continued symptoms to ensure hemodynamics are stabilized.  Case discussed with hospitalist.      FINAL CLINICAL IMPRESSION(S) / ED DIAGNOSES   Final diagnoses:  Paroxysmal atrial flutter (HCC)  Precordial pain  Near syncope     Rx / DC Orders   ED Discharge Orders          Ordered    bisoprolol (ZEBETA) 5 MG tablet  Daily        08/17/22 1732             Note:  This document was prepared using Dragon voice recognition  software and may include unintentional dictation errors.   Carrie Mew, MD 08/17/22 [phone]

## 2022-08-17 NOTE — ED Notes (Addendum)
Pt had three episodes of spike of tachy and heart flutter. Pt states after the second episode she had blurry vision and after third episode was SOB. When this RN arrived her SPO2 was at 98%. Put patient on 2L [place]. MD was notified to come into pt room and see patient. MD stated he was going to consult to admit versus discharge like originally planned since starting to be symptomatic. Granddaughter at bedside.

## 2022-08-17 NOTE — ED Notes (Signed)
Pt ambulatory to toilet with steady gait no assisting.

## 2022-08-17 NOTE — Assessment & Plan Note (Addendum)
Suspicious for ischemic heart disease/ LBBB On EKG and st depression.  Cardiology on board for patient.  Asa/ rosuvastatin continued.

## 2022-08-17 NOTE — Hospital Course (Signed)
Patient is here for chest pain / a/fib/ flutter.

## 2022-08-17 NOTE — ED Notes (Signed)
MD at bedside. 

## 2022-08-17 NOTE — ED Triage Notes (Signed)
Pt here via [facility] with chest discomfort that started at 1200.Pt took 2 expired nitro at home with no improvement. Pt given 324 mg aspirin by ems. Pt unsure of hx of a fib, pt states she took metoprolol in the past but not currently. Pt was at her primary this morning for a routine check up. 20G LAC    70-150 184/77 98% RA

## 2022-08-17 NOTE — Assessment & Plan Note (Addendum)
Suspect IDA. Also puts strain on her heart.  Pt to start iron supplement upon d/c.  Defer to pcp for IDA eval.

## 2022-08-17 NOTE — Assessment & Plan Note (Addendum)
No change in dose of levothyroxine.

## 2022-08-17 NOTE — H&P (Signed)
History and Physical    Chief Complaint: Chest discomfort.    HISTORY OF PRESENT ILLNESS: Kathryn Wise is an 89 y.o. female seen in the emergency room brought by EMS for chest discomfort, Patient reportedly took 2 nitroglycerin at home that were expired was given aspirin 324 by EMS.  Patient has taken metoprolol in the past but currently Is not on medications and has taken medications for blood pressure and cardiomyopathy intermittently per report. Also per report patient had syncopal episode at home her symptoms started today by herself on, patient reported palpitations and heart racing symptoms resolved after A minute or so when she went home.  It happened again.  She called EMS and had taken nitroglycerin for chest pain and was given aspirin. Pt follows with [facility] and case was d/w Dr.Golan.  Per report pt has tried metoprolol, diltiazem,carvedilol, but pt has not liked taking the meds.  Chart review shows that patient had a MRI of the brain without contrast in April 2023 that showed pt has  2 mm chronic subdural hematoma without mass effect or midline shift. Past Medical History:  Diagnosis Date   Arthritis    Collagen vascular disease ([place])    Dysphagia, pharyngoesophageal phase 09/10/2013   Upper GI study with barium swallow was done at  [facility] Mar 2015   No reflux seen  Small irreducible hiatal hernia  Mild changes of presbyeophagus (abnormal contractions of the esophagus that occur with aging) No strictures Normal gastric emptying  Incomplete visualization of stomach fold due to patient's inability turn     GERD (gastroesophageal reflux disease)    Heart murmur    has had years and years   History of shingles Dec 2013   treated with steroids , post op from shoulder surgery   History of squamous cell carcinoma 05/02/2016   right mid lateral pretibial   Hypertension    Hypothyroidism    Major depressive disorder, single episode 11/05/2015   Osteoarthritis of left shoulder  07/14/2012   Pneumonia 10/21/2020   Squamous cell carcinoma of skin 05/12/2016   R mid lat pretibial - other skin cancers treated by Dr. Sharlett Iles   Squamous cell carcinoma of skin 08/22/2020   left distal medial popliteal - [facility]   Review of Systems  Cardiovascular:  Positive for chest pain and palpitations.  All other systems reviewed and are negative.  Allergies  Allergen Reactions   Alendronate Swelling    And nausea     Codeine    Lisinopril     Spike in BP   Metoprolol Other (See Comments)    Lethargy - PT CURRENTLY TAKING    Oxycodone     Other reaction(s): Other (See Comments) Altered mental status   Oxycontin [Oxycodone Hcl]     Altered mental status   Past Surgical History:  Procedure Laterality Date   ARTHOSCOPIC ROTAOR CUFF REPAIR     LEFT  10+  YEARS     CAROTID PTA/STENT INTERVENTION Left 10/19/2020   Procedure: CAROTID PTA/STENT INTERVENTION;  Surgeon: Katha Cabal, MD;  Location: [place] [facility];  Service: Cardiovascular;  Laterality: Left;   CATARACT EXTRACTION W/ INTRAOCULAR LENS IMPLANT     RIGHT EYE   CATARACT EXTRACTION W/PHACO Left 03/28/2016   Procedure: CATARACT EXTRACTION PHACO AND INTRAOCULAR LENS PLACEMENT (IOC);  Surgeon: Leandrew Koyanagi, MD;  Location: [place];  Service: Ophthalmology;  Laterality: Left;  TORIC   FACELIFT     FOOT SURGERY     rt  foot   TUMOR REMOVED   JOINT REPLACEMENT Left Dec 2013   shoulder   LEFT HEART CATH AND CORONARY ANGIOGRAPHY N/A 06/12/2017   Procedure: LEFT HEART CATH AND CORONARY ANGIOGRAPHY;  Surgeon: Minna Merritts, MD;  Location: [place] [facility];  Service: Cardiovascular;  Laterality: N/A;   RENAL ANGIOGRAPHY Left 01/07/2019   Procedure: RENAL ANGIOGRAPHY;  Surgeon: Katha Cabal, MD;  Location: [place] [facility];  Service: Cardiovascular;  Laterality: Left;   RENAL ARTERY STENT     SKIN CANCER EXCISION     TOTAL SHOULDER ARTHROPLASTY  07/14/2012   Procedure: TOTAL  SHOULDER ARTHROPLASTY;  Surgeon: Johnny Bridge, MD;  Location: [place];  Service: Orthopedics;  Laterality: Left;       MEDICATIONS: Current Outpatient Medications  Medication Instructions   aspirin EC 81 mg, Oral, Daily   bisoprolol (ZEBETA) 5 mg, Oral, Daily, Take once daily. May take an additional '5mg'$  once per day for fast heart rate.   cyanocobalamin (VITAMIN B12) 1,000 mcg, Oral, Daily   furosemide (LASIX) 20 mg, Daily   ketoconazole (NIZORAL) 2 % cream 1 Application, Topical, Daily at bedtime, Apply to the feet and between the toes every night.   levothyroxine (SYNTHROID) 88 MCG tablet TAKE 1 TABLET(88 MCG) BY MOUTH DAILY BEFORE BREAKFAST   omeprazole (PRILOSEC) 20 MG capsule TAKE 1 CAPSULE(20 MG) BY MOUTH EVERY MORNING AS NEEDED FOR HEARTBURN   rosuvastatin (CRESTOR) 10 mg, Oral, Daily at bedtime   temazepam (RESTORIL) 15 MG capsule TAKE 1 CAPSULE BY MOUTH AT BEDTIME AS NEEDED SLEEP   Turmeric 500 mg, Oral, Daily   Vitamin D-3 1,000 Units, Oral, Daily    ED Course: Vitals:   08/17/22 2100 08/17/22 2130 08/17/22 2200 08/17/22 2230  BP: (!) 158/69 (!) 155/48 130/64 (!) 128/47  Pulse: (!) 54 62 61 60  Resp: '16 20 18 17  '$ Temp:   97.7 F (36.5 C)   TempSrc:   Oral   SpO2: 97% 97% 96% 97%  Weight:      Height:      Pt in ED is A/O and afebrile. Hypertensive and found to be in a.fib/flutter.  No intake/output data recorded. SpO2: 97 % O2 Flow Rate (L/min): 2 L/min Blood work in ed shows: Glucose of 116, LFTs.  Platelet count 149, troponin 277, repeat of 305 and repeat of 286. 54 1.19 which is mildly hyperthyroid, TSH of 1.061. EKG is abnormal showing left bundle branch block that is similar to her previous EKG.           Echocardiogram 11/2021: 1. Left ventricular ejection fraction, by estimation, is 60 to 65%. The  left ventricle has normal function. The left ventricle has no regional  wall motion abnormalities. There is moderat   2. There is moderate left  ventricular hypertrophy with severe basal  septal hypertrophy. LVOT gradient noted, 39 mm Hg at rest, up to 52 mm Hg  with valsalva. One measurement estimating gradient at 127 mm Hg.   3. Right ventricular systolic function is normal. The right ventricular  size is normal. Tricuspid regurgitation signal is inadequate for assessing  PA pressure.   4. Left atrial size was mildly dilated.   5. The mitral valve is normal in structure. Moderate mitral valve  regurgitation. Severe mitral annular calcification.   6. The aortic valve is normal in structure. Aortic valve regurgitation is  not visualized. Aortic valve sclerosis/calcification is present, without  any evidence of aortic stenosis.  Results for  orders placed or performed during the hospital encounter of 08/17/22 (from the past 24 hour(s))  Basic metabolic panel     Status: Abnormal   Collection Time: 08/17/22  3:13 PM  Result Value Ref Range   Sodium 137 135 - 145 mmol/L   Potassium 3.8 3.5 - 5.1 mmol/L   Chloride 104 98 - 111 mmol/L   CO2 23 22 - 32 mmol/L   Glucose, Bld 116 (H) 70 - 99 mg/dL   BUN 12 8 - 23 mg/dL   Creatinine, Ser 0.77 0.44 - 1.00 mg/dL   Calcium 9.1 8.9 - 10.3 mg/dL   GFR, Estimated >60 >60 mL/min   Anion gap 10 5 - 15  CBC     Status: Abnormal   Collection Time: 08/17/22  3:13 PM  Result Value Ref Range   WBC 8.3 4.0 - 10.5 K/uL   RBC 4.63 3.87 - 5.11 MIL/uL   Hemoglobin 12.7 12.0 - 15.0 g/dL   HCT 40.1 36.0 - 46.0 %   MCV 86.6 80.0 - 100.0 fL   MCH 27.4 26.0 - 34.0 pg   MCHC 31.7 30.0 - 36.0 g/dL   RDW 14.7 11.5 - 15.5 %   Platelets 149 (L) 150 - 400 K/uL   nRBC 0.0 0.0 - 0.2 %  Troponin I (High Sensitivity)     Status: Abnormal   Collection Time: 08/17/22  3:13 PM  Result Value Ref Range   Troponin I (High Sensitivity) 277 (HH) <18 ng/L  Brain natriuretic peptide     Status: Abnormal   Collection Time: 08/17/22  3:13 PM  Result Value Ref Range   B Natriuretic Peptide 710.6 (H) 0.0 - 100.0 pg/mL   D-dimer, quantitative     Status: Abnormal   Collection Time: 08/17/22  3:14 PM  Result Value Ref Range   D-Dimer, Quant 1.22 (H) 0.00 - 0.50 ug/mL-FEU  T4, free     Status: Abnormal   Collection Time: 08/17/22  4:00 PM  Result Value Ref Range   Free T4 1.19 (H) 0.61 - 1.12 ng/dL  TSH     Status: None   Collection Time: 08/17/22  4:00 PM  Result Value Ref Range   TSH 1.061 0.350 - 4.500 uIU/mL  Magnesium     Status: None   Collection Time: 08/17/22  4:00 PM  Result Value Ref Range   Magnesium 1.9 1.7 - 2.4 mg/dL  Troponin I (High Sensitivity)     Status: Abnormal   Collection Time: 08/17/22  4:44 PM  Result Value Ref Range   Troponin I (High Sensitivity) 305 (HH) <18 ng/L  Troponin I (High Sensitivity)     Status: Abnormal   Collection Time: 08/17/22  5:22 PM  Result Value Ref Range   Troponin I (High Sensitivity) 286 (HH) <18 ng/L    Unresulted Labs (From admission, onward)    None      Pt has received : Orders Placed This Encounter  Procedures   DG Chest 2 View    If patient pregnant, contact provider.    Standing Status:   Standing    Number of Occurrences:   1    Order Specific Question:   Reason for Exam (SYMPTOM  OR DIAGNOSIS REQUIRED)    Answer:   cp    Order Specific Question:   Radiology Contrast Protocol - do NOT remove file path    Answer:   [email or web address]   Basic metabolic panel    Standing Status:   Standing  Number of Occurrences:   1   CBC    Standing Status:   Standing    Number of Occurrences:   1   T4, free    Standing Status:   Standing    Number of Occurrences:   1   TSH    Standing Status:   Standing    Number of Occurrences:   1   Magnesium    Standing Status:   Standing    Number of Occurrences:   1   Brain natriuretic peptide    Standing Status:   Standing    Number of Occurrences:   1   D-dimer, quantitative    Standing Status:   Standing    Number of Occurrences:   1    Amb referral to AFIB Clinic    Referral Priority:   Routine    Referral Type:   Consultation    Number of Visits Requested:   1   Diet heart healthy/carb modified Room service appropriate? Yes; Fluid consistency: Thin    Standing Status:   Standing    Number of Occurrences:   1    Order Specific Question:   Diet-HS Snack?    Answer:   Nothing    Order Specific Question:   Room service appropriate?    Answer:   Yes    Order Specific Question:   Fluid consistency:    Answer:   Thin   Document Height and Actual Weight    Use scales to weigh patient, not stated or estimated weight.    Standing Status:   Standing    Number of Occurrences:   1   Notify physician (specify)    Standing Status:   Standing    Number of Occurrences:   1    Order Specific Question:   Notify Physician    Answer:   HR<60 or >130    Order Specific Question:   Notify Physician    Answer:   SBP <90    Order Specific Question:   Notify Physician    Answer:   Pauses >2 sec    Order Specific Question:   Notify Physician    Answer:   new arrhythmia    Order Specific Question:   Notify Physician    Answer:   return to SR    Order Specific Question:   Notify Physician    Answer:   signs & symptoms of MI   Strict intake and output    Standing Status:   Standing    Number of Occurrences:   1   Target HR: 65-105 bpm with return to baseline rhythm and hemodynamic stability    with return to baseline rhythm and hemodynamic stability    Standing Status:   Standing    Number of Occurrences:   1    Order Specific Question:   Target HR    Answer:   65-105 bpm   Apply Atrial Arrhythmia Care Plan    Standing Status:   Standing    Number of Occurrences:   1   STAT CBG when hypoglycemia is suspected. If treated, recheck every 15 minutes after each treatment until CBG >/= 70 mg/dl    Standing Status:   Standing    Number of Occurrences:   1   Refer to Hypoglycemia Protocol Sidebar Report for treatment of CBG < 70 mg/dl     Standing Status:   Standing    Number of Occurrences:   1  Cardiac Monitoring - Continuous Indefinite    Standing Status:   Standing    Number of Occurrences:   1    Order Specific Question:   Indications for use:    Answer:   ICU/Stepdown patient   Vital signs    Standing Status:   Standing    Number of Occurrences:   1   Notify physician (specify)    Standing Status:   Standing    Number of Occurrences:   28    Order Specific Question:   Notify Physician    Answer:   for pulse less than 55 or greater than 120    Order Specific Question:   Notify Physician    Answer:   for respiratory rate less than 12 or greater than 25    Order Specific Question:   Notify Physician    Answer:   for temperature greater than 100.5 F    Order Specific Question:   Notify Physician    Answer:   for urinary output less than 30 mL/hr for four hours    Order Specific Question:   Notify Physician    Answer:   for systolic BP less than 90 or greater than 883, diastolic BP less than 60 or greater than 100   Progressive Mobility Protocol: No Restrictions    Standing Status:   Standing    Number of Occurrences:   1   Initiate Oral Care Protocol    Standing Status:   Standing    Number of Occurrences:   1   Initiate Carrier Fluid Protocol    Standing Status:   Standing    Number of Occurrences:   1   RN may order General Admission PRN Orders utilizing "General Admission PRN medications" (through manage orders) for the following patient needs: allergy symptoms (Claritin), cold sores (Carmex), cough (Robitussin DM), eye irritation (Liquifilm Tears), hemorrhoids (Tucks), indigestion (Maalox), minor skin irritation (Hydrocortisone Cream), muscle pain (Ben Gay), nose irritation (saline nasal spray) and sore throat (Chloraseptic spray).    Standing Status:   Standing    Number of Occurrences:   25498   Daily weights    Standing Status:   Standing    Number of Occurrences:   1   Intake and Output     Standing Status:   Standing    Number of Occurrences:   1   Full code    Standing Status:   Standing    Number of Occurrences:   1    Order Specific Question:   By:    Answer:   Other   Consult to hospitalist    Standing Status:   Standing    Number of Occurrences:   1    Order Specific Question:   Place call to:    Answer:   ED, [phone]    Order Specific Question:   Reason for Consult    Answer:   Admit    Order Specific Question:   Diagnosis/Clinical Info for Consult:    Answer:   chest pain, paroxysmal arrythmia   Inpatient consult to Cardiology [facility] only - Select consulting group: [facility]; Consult Timeframe: ROUTINE - requires response within 24 hours; Reason for Consult? Chest pain/ a.fib/ flutter on admission.    Standing Status:   Standing    Number of Occurrences:   1    Order Specific Question:   [place] only - Select consulting group    Answer:   [facility]    Order Specific Question:  Consult Timeframe    Answer:   ROUTINE - requires response within 24 hours    Order Specific Question:   Reason for Consult?    Answer:   Chest pain/ a.fib/ flutter on admission.   Pulse oximetry check with vital signs    Standing Status:   Standing    Number of Occurrences:   1   Oxygen therapy Mode or (Route): Nasal cannula; Liters Per Minute: 2; Keep 02 saturation: greater than 92 %    Standing Status:   Standing    Number of Occurrences:   1    Order Specific Question:   Mode or (Route)    Answer:   Nasal cannula    Order Specific Question:   Liters Per Minute    Answer:   2    Order Specific Question:   Keep 02 saturation    Answer:   greater than 92 %   ED EKG    Standing Status:   Standing    Number of Occurrences:   1    Order Specific Question:   Reason for Exam    Answer:   Chest Pain   EKG 12-Lead    Standing Status:   Standing    Number of Occurrences:   1   EKG 12-Lead    Standing Status:   Standing    Number of Occurrences:   1   EKG 12-lead    Standing Status:   Standing     Number of Occurrences:   20    Order Specific Question:   Notes    Answer:   atrial fibrillation / atrial flutter   Admit to Inpatient (patient's expected length of stay will be greater than 2 midnights or inpatient only procedure)    Standing Status:   Standing    Number of Occurrences:   1    Order Specific Question:   Hospital Area    Answer:   [place] [[id number]]    Order Specific Question:   Level of Care    Answer:   Telemetry Cardiac [103]    Order Specific Question:   Covid Evaluation    Answer:   Asymptomatic - no recent exposure (last 10 days) testing not required    Order Specific Question:   Diagnosis    Answer:   Chest pain due to CAD [facility]) [[id number]]    Order Specific Question:   Admitting Physician    Answer:   Cherylann Ratel    Order Specific Question:   Attending Physician    Answer:   Cherylann Ratel    Order Specific Question:   Certification:    Answer:   I certify this patient will need inpatient services for at least 2 midnights    Order Specific Question:   Estimated Length of Stay    Answer:   2    Meds ordered this encounter  Medications   bisoprolol (ZEBETA) tablet 5 mg   hydrALAZINE (APRESOLINE) tablet 25 mg   bisoprolol (ZEBETA) 5 MG tablet    Sig: Take 1 tablet (5 mg total) by mouth daily. Take once daily. May take an additional '5mg'$  once per day for fast heart rate.    Dispense:  90 tablet    Refill:  0   rosuvastatin (CRESTOR) tablet 10 mg   aspirin EC tablet 81 mg   DISCONTD: insulin aspart (novoLOG) injection 0-15 Units    Order Specific Question:   Correction coverage:  Answer:   Moderate (average weight, post-op)    Order Specific Question:   CBG < 70:    Answer:   Implement Hypoglycemia Standing Orders and refer to Hypoglycemia Standing Orders sidebar report    Order Specific Question:   CBG 70 - 120:    Answer:   0 units    Order Specific Question:   CBG 121 - 150:    Answer:   2 units    Order  Specific Question:   CBG 151 - 200:    Answer:   3 units    Order Specific Question:   CBG 201 - 250:    Answer:   5 units    Order Specific Question:   CBG 251 - 300:    Answer:   8 units    Order Specific Question:   CBG 301 - 350:    Answer:   11 units    Order Specific Question:   CBG 351 - 400:    Answer:   15 units    Order Specific Question:   CBG > 400    Answer:   call MD and obtain STAT lab verification   DISCONTD: insulin aspart (novoLOG) injection 0-5 Units    Order Specific Question:   Correction coverage:    Answer:   HS scale    Order Specific Question:   CBG < 70:    Answer:   Implement Hypoglycemia Standing Orders and refer to Hypoglycemia Standing Orders sidebar report    Order Specific Question:   CBG 70 - 120:    Answer:   0 units    Order Specific Question:   CBG 121 - 150:    Answer:   0 units    Order Specific Question:   CBG 151 - 200:    Answer:   0 units    Order Specific Question:   CBG 201 - 250:    Answer:   2 units    Order Specific Question:   CBG 251 - 300:    Answer:   3 units    Order Specific Question:   CBG 301 - 350:    Answer:   4 units    Order Specific Question:   CBG 351 - 400:    Answer:   5 units    Order Specific Question:   CBG > 400    Answer:   call MD and obtain STAT lab verification   OR Linked Order Group    acetaminophen (TYLENOL) tablet 650 mg    acetaminophen (TYLENOL) suppository 650 mg   pantoprazole (PROTONIX) injection 40 mg   nitroGLYCERIN (NITROSTAT) SL tablet 0.4 mg     Admission Imaging : DG Chest 2 View  Result Date: 08/17/2022 CLINICAL DATA:  Chest discomfort. EXAM: CHEST - 2 VIEW COMPARISON:  CT chest and chest x-ray dated November 13, 2021. FINDINGS: The heart size and mediastinal contours are within normal limits. Both lungs are clear. No acute osseous abnormality. IMPRESSION: No active cardiopulmonary disease. Electronically Signed   By: Titus Dubin M.D.   On: 08/17/2022 15:38   Physical  Examination: Vitals:   08/17/22 2100 08/17/22 2130 08/17/22 2200 08/17/22 2230  BP: (!) 158/69 (!) 155/48 130/64 (!) 128/47  Pulse: (!) 54 62 61 60  Temp:   97.7 F (36.5 C)   Resp: '16 20 18 17  '$ Height:      Weight:      SpO2: 97% 97% 96% 97%  TempSrc:  Oral   BMI (Calculated):       Physical Exam Vitals and nursing note reviewed.  Constitutional:      General: She is not in acute distress.    Appearance: Normal appearance. She is not ill-appearing, toxic-appearing or diaphoretic.  HENT:     Head: Normocephalic and atraumatic.     Right Ear: Hearing and external ear normal.     Left Ear: Hearing and external ear normal.     Nose: Nose normal. No nasal deformity.     Mouth/Throat:     Lips: Pink.     Mouth: Mucous membranes are moist.     Tongue: No lesions.     Pharynx: Oropharynx is clear.  Eyes:     Extraocular Movements: Extraocular movements intact.  Neck:     Vascular: No carotid bruit.  Cardiovascular:     Rate and Rhythm: Normal rate and regular rhythm.     Pulses: Normal pulses.     Heart sounds: Normal heart sounds.  Pulmonary:     Effort: Pulmonary effort is normal.     Breath sounds: Normal breath sounds.  Abdominal:     General: Bowel sounds are normal. There is no distension.     Palpations: Abdomen is soft. There is no mass.     Tenderness: There is no abdominal tenderness. There is no guarding.     Hernia: No hernia is present.  Musculoskeletal:     Right lower leg: No edema.     Left lower leg: No edema.  Skin:    General: Skin is warm.  Neurological:     General: No focal deficit present.     Mental Status: She is alert and oriented to person, place, and time.     Cranial Nerves: Cranial nerves 2-12 are intact.     Motor: Motor function is intact.  Psychiatric:        Attention and Perception: Attention normal.        Mood and Affect: Mood normal.        Speech: Speech normal.        Behavior: Behavior normal. Behavior is cooperative.         Cognition and Memory: Cognition normal.       Assessment and Plan: * Chest pain due to CAD ([place]) Suspicious for ischemic heart disease/ LBBB On EKG and st depression.  Cardiology on board for patient.  Asa/ rosuvastatin continued.    Elevated troponin Suspect demand ischemia.  We will consult cardiology for eval and management.    Paroxysmal atrial fibrillation with RVR ([place]) we will cont pt on bisoprolol. Pt to verify if her allergies are True allergies.  She will call her daughter.  She has h/o SDH and anticoagulation is high risk of  bleeding.    Hypothyroidism No change in dose of levothyroxine.   Iron deficiency anemia, unspecified Suspect IDA. Also puts strain on her heart.  Pt to start iron supplement upon d/c.  Defer to pcp for IDA eval.    Prediabetes Consistent carb diet.  Accucheck QAC/TID.   GERD (gastroesophageal reflux disease) PPI therapy.      DVT prophylaxis:  SCD's   Code Status:  Full code   Family Communication:  Estelle Grumbles (Granddaughter)  [phone]   Disposition Plan:  Home   Consults called:  [facility] CARDIOLOGY  Admission status: Inpatient   Unit/ Expected LOS: Cardiac tele / 2 days.    Para Skeans MD [facility]  6 PM- 2  AM. Please contact me via secure Chat 6 PM-2 AM. [phone]( Pager ) To contact the [facility] Attending or Consulting provider [place] or covering provider during after hours [place], for this patient.   Check the care team in [facility] and look for a) attending/consulting [facility] provider listed and b) the [facility] team listed Log into [email or web address] and use [place]'s universal password to access. If you do not have the password, please contact the hospital operator. Locate the [facility] provider you are looking for under [facility] and page to a number that you can be directly reached. If you still have difficulty reaching the provider, please page the [facility] (Director on Call) for the Hospitalists listed on  amion for assistance. [email or web address] 08/17/2022, 10:52 PM

## 2022-08-17 NOTE — ED Notes (Signed)
Pt ambulatory with 1 assist to the toilet and back.

## 2022-08-17 NOTE — ED Notes (Signed)
See triage note. Pt came via EMS. She has been having episodes of chest pain. When she is feeling one coming on she can feel in her chest and head and various areas of the body. However, when the episode stops pt is asymptomatic.

## 2022-08-18 DIAGNOSIS — I10 Essential (primary) hypertension: Secondary | ICD-10-CM | POA: Diagnosis not present

## 2022-08-18 DIAGNOSIS — I739 Peripheral vascular disease, unspecified: Secondary | ICD-10-CM

## 2022-08-18 DIAGNOSIS — R0989 Other specified symptoms and signs involving the circulatory and respiratory systems: Secondary | ICD-10-CM

## 2022-08-18 DIAGNOSIS — I4892 Unspecified atrial flutter: Secondary | ICD-10-CM

## 2022-08-18 DIAGNOSIS — I421 Obstructive hypertrophic cardiomyopathy: Secondary | ICD-10-CM

## 2022-08-18 DIAGNOSIS — R55 Syncope and collapse: Secondary | ICD-10-CM | POA: Diagnosis not present

## 2022-08-18 DIAGNOSIS — I479 Paroxysmal tachycardia, unspecified: Secondary | ICD-10-CM | POA: Diagnosis not present

## 2022-08-18 DIAGNOSIS — R079 Chest pain, unspecified: Secondary | ICD-10-CM | POA: Diagnosis present

## 2022-08-18 DIAGNOSIS — I25119 Atherosclerotic heart disease of native coronary artery with unspecified angina pectoris: Secondary | ICD-10-CM | POA: Diagnosis not present

## 2022-08-18 MED ORDER — NITROGLYCERIN 0.4 MG SL SUBL: 0.4000 mg | SUBLINGUAL_TABLET | SUBLINGUAL | 1 refills | Status: AC | PRN

## 2022-08-18 MED ORDER — HYDRALAZINE HCL 50 MG PO TABS: 25.0000 mg | ORAL_TABLET | Freq: Four times a day (QID) | ORAL | Status: DC | PRN

## 2022-08-18 MED ORDER — BISOPROLOL FUMARATE 5 MG PO TABS
5.0000 mg | ORAL_TABLET | Freq: Every day | ORAL | Status: DC
  Administered 2022-08-18: 5 mg via ORAL
  Filled 2022-08-18 (×2): qty 1

## 2022-08-18 MED ORDER — HYDRALAZINE HCL 25 MG PO TABS: 25.0000 mg | ORAL_TABLET | Freq: Four times a day (QID) | ORAL | 1 refills | Status: DC | PRN

## 2022-08-18 NOTE — Progress Notes (Signed)
This Education officer, museum notified by [facility] that a code 44 needed to be issued, however the patient had already been discharged.  This social worker attempted to reach patient by phone, however there was no answer. VM message left requesting a return call.   Kwanza Cancelliere, LCSW Transition of Care

## 2022-08-18 NOTE — ED Notes (Signed)
Pt provided a sandwich tray and water.

## 2022-08-18 NOTE — ED Notes (Signed)
Pt verbalized understanding of discharge instructions.

## 2022-08-18 NOTE — Discharge Instructions (Signed)
Advised to follow-up with cardiology as scheduled. Advised to take bisoprolol 5 mg daily for tachycardia

## 2022-08-18 NOTE — Discharge Summary (Signed)
Physician Discharge Summary  Kathryn Wise QQI:[id number] DOB: 09-09-1929 DOA: 08/17/2022  PCP: Gladstone Lighter, MD  Admit date: 08/17/2022  Discharge date: 08/18/2022  Admitted From: Home. Disposition:  Home  Recommendations for Outpatient Follow-up:  Follow up with PCP in 1-2 weeks. Please obtain BMP/CBC in one week. Advised to follow-up with Cardiology as scheduled. Advised to take bisoprolol 5 mg daily for tachycardia  Home Health:None Equipment/Devices:None  Discharge Condition: Stable CODE STATUS:Full code Diet recommendation: Heart Healthy   Brief [facility] Course: This 89 years old female with PMH significant for nonobstructive coronary artery disease, concentric LVH/HOCM, labile hypertension, left renal artery stenosis, s/p stenting in June 2020, carotid artery stenosis and stenting on the left side in March 2022, left bundle branch block presented in the ED with c/o: tachycardia and dizziness.  Patient does have several prior hospitalization for labile hypertension. She does have medication intolerances to carvedilol and diltiazem.  Patient states she was at the hairdresser yesterday when she developed acute onset of palpitations with some dizziness and near syncopal episode.  Symptoms persisted on and off. She was able to get herself home with a driving, at home she continued to have symptoms of palpitations with associated near syncope.  She had the EMS button on her phone for further evaluation and was brought to the ED.  In the ED She was found to have troponin 277 with peak 305, telemetry shows short runs of tachycardia.  Patient was given a dose of bisoprolol 5 mg yesterday which improved her heart rate.  Patient was seen by cardiology,  states patient is doing better and can be discharged on bisoprolol.  There is no need for any ischemic evaluation at this time.  Symptoms likely exacerbated in the setting of HOCM.  Patient could consider outpatient ZIO monitoring.   Patient felt much improved and wants to be discharged.  Patient is being discharged home.  Discharge Diagnoses:  Principal Problem:   Chest pain due to CAD [facility]) Active Problems:   Elevated troponin   Hypothyroidism   Paroxysmal atrial fibrillation with RVR (HCC)   GERD (gastroesophageal reflux disease)   Precordial pain   Prediabetes   Iron deficiency anemia, unspecified   Paroxysmal atrial flutter (HCC)   Near syncope   Paroxysmal tachycardia (HCC)   HOCM (hypertrophic obstructive cardiomyopathy) (HCC)   Labile hypertension   Chest pain    Discharge Instructions  Discharge Instructions     Amb referral to [facility] Clinic   Complete by: As directed    Call MD for:  difficulty breathing, headache or visual disturbances   Complete by: As directed    Call MD for:  persistant dizziness or light-headedness   Complete by: As directed    Call MD for:  persistant nausea and vomiting   Complete by: As directed    Diet - low sodium heart healthy   Complete by: As directed    Diet Carb Modified   Complete by: As directed    Discharge instructions   Complete by: As directed    Follow-up with primary care physician in 1 week. Advised to follow-up with cardiology as scheduled. Advised to take bisoprolol 5 mg daily for tachycardia   Increase activity slowly   Complete by: As directed       Allergies as of 08/18/2022       Reactions   Alendronate Swelling   And nausea    Codeine    Lisinopril    Spike in BP  Metoprolol Other (See Comments)   Lethargy - PT CURRENTLY TAKING    Oxycodone    Other reaction(s): Other (See Comments) Altered mental status   Oxycontin [oxycodone Hcl]    Altered mental status        Medication List     STOP taking these medications    furosemide 20 MG tablet Commonly known as: LASIX   ketoconazole 2 % cream Commonly known as: NIZORAL       TAKE these medications    aspirin EC 81 MG tablet Take 81 mg by mouth daily.    bisoprolol 5 MG tablet Commonly known as: ZEBETA Take 1 tablet (5 mg total) by mouth daily. Take once daily. May take an additional '5mg'$  once per day for fast heart rate.   cyanocobalamin 1000 MCG tablet Commonly known as: VITAMIN B12 Take 1,000 mcg by mouth daily.   hydrALAZINE 25 MG tablet Commonly known as: APRESOLINE Take 1 tablet (25 mg total) by mouth every 6 (six) hours as needed (pressure >160).   levothyroxine 88 MCG tablet Commonly known as: SYNTHROID TAKE 1 TABLET(88 MCG) BY MOUTH DAILY BEFORE BREAKFAST What changed: See the new instructions.   nitroGLYCERIN 0.4 MG SL tablet Commonly known as: NITROSTAT Place 1 tablet (0.4 mg total) under the tongue every 5 (five) minutes as needed for chest pain.   omeprazole 20 MG capsule Commonly known as: PRILOSEC TAKE 1 CAPSULE(20 MG) BY MOUTH EVERY MORNING AS NEEDED FOR HEARTBURN   rosuvastatin 10 MG tablet Commonly known as: CRESTOR Take 1 tablet (10 mg total) by mouth at bedtime.   temazepam 15 MG capsule Commonly known as: RESTORIL TAKE 1 CAPSULE BY MOUTH AT BEDTIME AS NEEDED SLEEP   Turmeric 500 MG Caps Take 500 mg by mouth daily.   Vitamin D-3 25 MCG (1000 UT) Caps Take 1,000 Units by mouth daily.        Follow-up Information     Rise Mu, PA-C In 3 days.   Specialties: Cardiology, Radiology Contact information: [place] [address] [phone]         Gladstone Lighter, MD Follow up in 1 week(s).   Specialty: Internal Medicine Contact information: [place] [address] [phone]                Allergies  Allergen Reactions   Alendronate Swelling    And nausea     Codeine    Lisinopril     Spike in BP   Metoprolol Other (See Comments)    Lethargy - PT CURRENTLY TAKING    Oxycodone     Other reaction(s): Other (See Comments) Altered mental status   Oxycontin [Oxycodone Hcl]     Altered mental status     Consultations: Cardiology   Procedures/Studies: DG Chest 2 View  Result Date: 08/17/2022 CLINICAL DATA:  Chest discomfort. EXAM: CHEST - 2 VIEW COMPARISON:  CT chest and chest x-ray dated November 13, 2021. FINDINGS: The heart size and mediastinal contours are within normal limits. Both lungs are clear. No acute osseous abnormality. IMPRESSION: No active cardiopulmonary disease. Electronically Signed   By: Titus Dubin M.D.   On: 08/17/2022 15:38    Subjective: Patient seen and examined at bedside.  Overnight events noted.   Patient is being discharged home. She denies any chest pain,  shortness of breath,  reports doing much better.  Discharge Exam: Vitals:   08/18/22 1236 08/18/22 1330  BP: (!) 146/70 (!) 156/82  Pulse: (!) 57 63  Resp: 16 13  Temp:    SpO2: 97% 97%   Vitals:   08/18/22 1124 08/18/22 1200 08/18/22 1236 08/18/22 1330  BP: (!) 140/59 (!) 147/59 (!) 146/70 (!) 156/82  Pulse: (!) 55 (!) 52 (!) 57 63  Resp: '18 18 16 13  '$ Temp: 98 F (36.7 C)     TempSrc: Oral     SpO2: 95% 94% 97% 97%  Weight:      Height:        General: Pt is alert, awake, not in acute distress Cardiovascular: RRR, S1/S2 +, no rubs, no gallops Respiratory: CTA bilaterally, no wheezing, no rhonchi Abdominal: Soft, NT, ND, bowel sounds + Extremities: no edema, no cyanosis    The results of significant diagnostics from this hospitalization (including imaging, microbiology, ancillary and laboratory) are listed below for reference.     Microbiology: No results found for this or any previous visit (from the past 240 hour(s)).   Labs: BNP (last 3 results) Recent Labs    11/13/21 0958 08/17/22 1513  BNP 418.1* 683.4*   Basic Metabolic Panel: Recent Labs  Lab 08/17/22 1513 08/17/22 1600  NA 137  --   K 3.8  --   CL 104  --   CO2 23  --   GLUCOSE 116*  --   BUN 12  --   CREATININE 0.77  --   CALCIUM 9.1  --   MG  --  1.9   Liver Function Tests: No results for  input(s): "AST", "ALT", "ALKPHOS", "BILITOT", "PROT", "ALBUMIN" in the last 168 hours. No results for input(s): "LIPASE", "AMYLASE" in the last 168 hours. No results for input(s): "AMMONIA" in the last 168 hours. CBC: Recent Labs  Lab 08/17/22 1513  WBC 8.3  HGB 12.7  HCT 40.1  MCV 86.6  PLT 149*   Cardiac Enzymes: No results for input(s): "CKTOTAL", "CKMB", "CKMBINDEX", "TROPONINI" in the last 168 hours. BNP: Invalid input(s): "POCBNP" CBG: No results for input(s): "GLUCAP" in the last 168 hours. D-Dimer Recent Labs    08/17/22 1514  DDIMER 1.22*   Hgb A1c No results for input(s): "HGBA1C" in the last 72 hours. Lipid Profile No results for input(s): "CHOL", "HDL", "LDLCALC", "TRIG", "CHOLHDL", "LDLDIRECT" in the last 72 hours. Thyroid function studies Recent Labs    08/17/22 1600  TSH 1.061   Anemia work up No results for input(s): "VITAMINB12", "FOLATE", "FERRITIN", "TIBC", "IRON", "RETICCTPCT" in the last 72 hours. Urinalysis    Component Value Date/Time   COLORURINE YELLOW 05/04/2020 1458   APPEARANCEUR CLEAR 05/04/2020 1458   LABSPEC 1.010 05/04/2020 1458   PHURINE 6.0 05/04/2020 1458   GLUCOSEU NEGATIVE 05/04/2020 1458   HGBUR TRACE-INTACT (A) 05/04/2020 1458   BILIRUBINUR NEGATIVE 05/04/2020 1458   KETONESUR NEGATIVE 05/04/2020 1458   PROTEINUR 30 (A) 02/24/2019 1134   UROBILINOGEN 0.2 05/04/2020 1458   NITRITE NEGATIVE 05/04/2020 1458   LEUKOCYTESUR SMALL (A) 05/04/2020 1458   Sepsis Labs Recent Labs  Lab 08/17/22 1513  WBC 8.3   Microbiology No results found for this or any previous visit (from the past 240 hour(s)).   Time coordinating discharge: Over 30 minutes  SIGNED:   Shawna Clamp, MD  [facility] 08/18/2022, 3:22 PM Pager   If 7PM-7AM, please contact night-coverage

## 2022-08-18 NOTE — Consult Note (Signed)
Cardiology Consultation:   Patient ID: Kathryn Wise; [id number]; [birth year]   Admit date: 08/17/2022 Date of Consult: 08/18/2022  Primary Care Provider: Gladstone Lighter, MD Primary Cardiologist: End Primary Electrophysiologist:  None   Patient Profile:   Kathryn Wise is a 89 y.o. female with a hx of nonobstructive CAD, HOCM, HFpEF, difficult to control hypertension with left renal artery stenosis status post stenting in 12/2018, carotid artery disease status post left sided stenting in 09/2020, possible small SDH noted in 10/2021, LBBB, HLD, and mitral regurgitation who is being seen today for the evaluation of tachycardia at the request of Dr. Posey Pronto.  History of Present Illness:   Kathryn Wise was previously followed by Dr. [name] in 2017 for cardiac murmur with echo at that time demonstrating an EF of 60 to 65%, normal wall motion, mild focal basal and mild concentric LVH of the septum with mild LVOT gradient, grade 1 diastolic dysfunction, mildly dilated aortic root and ascending aorta, mild mitral regurgitation, normal RV systolic function and PASP.  She was admitted for chest pain in 05/2017 with Lexiscan being abnormal.  She subsequently underwent LHC in 05/2017, which demonstrated nonobstructive CAD involving the LCx and RCA with medical management recommended.  Echo in 05/2017 showed an EF of 55 to 60%, normal wall motion, mild focal basal and mild concentric LVH, near cavity obliteration in systole, grade 1 diastolic dysfunction, very mild aortic stenosis, moderate mitral regurgitation, mildly dilated left atrium, normal RV systolic function, and a PASP of 50 mmHg.  Following this, she was lost to follow-up.   She was admitted to the hospital in 09/2020 with pneumonia and HFpEF.  Echo demonstrated an EF of 55 to 60%, moderate LVH, grade 2 diastolic dysfunction with moderate LVOT obstruction with a peak gradient of 68 mmHg at rest and 74 mmHg with Valsalva, normal RV systolic function and  ventricular cavity size, moderately elevated PASP estimated at 46.5 mmHg, moderately dilated left atrium, mild mitral regurgitation, mild mitral stenosis, moderate mitral annular calcification, and mild to moderate aortic valve sclerosis without evidence of stenosis.  High-sensitivity troponin trended to 976.  She was lost to follow-up.   She was admitted in 09/2021 with hypertensive urgency with noted improvement in BP following 10 mg of amlodipine and 5 mg of IV labetalol.  She was noted to have a mildly elevated high-sensitivity troponin peaking at 36 felt to be related to supply demand ischemia from hypertension.   She was admitted to the hospital in 10/2021 noting left upper extremity numbness that was transient, bilateral lower extremity swelling, and generalized weakness.  She was consulted on by neurosurgery for possible TIA.  MRI of the brain showed a possible 2 mm subdural hematoma overlying the right cerebral convexity.  This was not well visualized on CT imaging.  She was unaware of any fall/trauma.  There was recommended the patient continue aspirin without need for antiepileptic medication given lack of subdural hematoma noted on CT imaging.  Echo, as read by outside group, showed an EF of 50 to 55%, no regional wall motion normalities, mild concentric LVH, grade 1 diastolic dysfunction, normal RV systolic function and ventricular cavity size, myxomatous mitral valve with mild stenosis, and mild aortic insufficiency.  Lower extremity ultrasound was negative for DVT bilaterally.  High-sensitivity troponin 28 with a delta troponin of 29.  BNP 418.   She was seen in hospital follow-up on 11/20/2021 and was without symptoms of angina or decompensation at that time.  She did report  an approximate 4-week history of left jaw pain, and in the setting had multiple teeth extracted along that side without symptomatic improvement.  This discomfort was exacerbated by exertion, chewing, and yawning.  Subsequent  Lexiscan MPI on 11/27/2021 was overall low risk without evidence of infarction or ischemia.  CT attenuated corrected images showed mild coronary artery calcification and aortic atherosclerosis.  Echo on 12/07/2021 demonstrated an EF of 60 to 65%, no regional wall motion abnormalities, moderate LVH with severe basal septal hypertrophy with an LVOT gradient of 39 mmHg at rest and 52 mmHg with Valsalva with one measurement of 127 mmHg, normal RV systolic function and ventricular cavity size, mildly dilated left atrium, moderate mitral valve regurgitation with severe mitral annular calcification, and aortic valve sclerosis without evidence of stenosis.   She was seen in the office on 12/28/2021 and was without symptoms of angina or decompensation.  She did feel somewhat deconditioned as she had not been able to exercise regularly.  She was without symptoms of dizziness, presyncope, or syncope.  Her blood pressure was improved following the addition of Cardizem CD.  We subsequently titrated this to 240 mg daily.  She subsequently reported issues with pain all over, noting that slight "changes" led to a flareup of polymyalgia.  Given this, it was recommended she resume lower dose Cardizem 180 mg daily.  She subsequently discontinued this medication altogether indicating she was "feeling poorly."  She was later diagnosed with a sinus infection.  We recommended she resume Cardizem 180 mg daily due to elevated BP.   She was seen in the office in 01/2022 noting diffuse myalgias and limited mobility while on diltiazem.  She underwent intermittent washing out of this medication with improvement in symptoms while she was off diltiazem.  However, she did continue to take diltiazem with intermittent elevated blood pressure readings.  She requested a prednisone taper for her myalgias.  We transitioned her to low-dose carvedilol 3.125 mg twice daily.  She subsequently notified our office in 02/2022 that carvedilol was working well for  her blood pressure, though causing fatigue and weakness leading it to be discontinued.  She was placed back on amlodipine 10 mg daily, which she previously tolerated without issues.     She was seen in the office in 03/2022 noting an increase in bilateral lower extremity swelling that began after reinitiating amlodipine.  She noticed an improvement in her energy following discontinuation of carvedilol.  She preferred to remain on amlodipine 10 mg daily.  She was started on furosemide 20 mg daily with follow-up labs showing stable renal function.  She was seen by her PCP yesterday for elevated blood pressure and placed on hydralazine.  While at the hairdresser, while sitting on the hair dryer, she developed sudden onset of tachypalpitations with associated dizziness and near syncope.  No frank syncope.  Symptoms lasted a few seconds and spontaneously resolved.  She drove herself home.  While at home, she had another episode of tachypalpitations associated near syncope that persisted a little longer prompting her to contact EMS.  She no chest pain or frank syncope.  Never had similar symptoms.  Presenting EKG in the ED shows sinus rhythm with known left bundle and changes consistent with HOCM.  High-sensitivity troponin 277 trending peaking at 305.  BNP 710.  Magnesium 1.9.  Potassium 3.8.  TSH normal.  Free T4 mildly elevated at 1.19.  D-dimer 1.22.  She was started on bisoprolol 5 mg daily with overall improvement in palpitation burden with  most recent episode of atrial tachycardia occurring around 1800 on 08/17/2022 and was short-lived.  Currently, patient is without symptoms of angina, dyspnea, palpitations, dizziness, recent COVID, or syncope.  She feels back to baseline and is hopeful for discharge today.    Past Medical History:  Diagnosis Date   Arthritis    Collagen vascular disease ([place])    Dysphagia, pharyngoesophageal phase 09/10/2013   Upper GI study with barium swallow was done at  [facility] Mar  2015   No reflux seen  Small irreducible hiatal hernia  Mild changes of presbyeophagus (abnormal contractions of the esophagus that occur with aging) No strictures Normal gastric emptying  Incomplete visualization of stomach fold due to patient's inability turn     GERD (gastroesophageal reflux disease)    Heart murmur    has had years and years   History of shingles Dec 2013   treated with steroids , post op from shoulder surgery   History of squamous cell carcinoma 05/02/2016   right mid lateral pretibial   Hypertension    Hypothyroidism    Major depressive disorder, single episode 11/05/2015   Osteoarthritis of left shoulder 07/14/2012   Pneumonia 10/21/2020   Squamous cell carcinoma of skin 05/12/2016   R mid lat pretibial - other skin cancers treated by Dr. Sharlett Iles   Squamous cell carcinoma of skin 08/22/2020   left distal medial popliteal - [facility]    Past Surgical History:  Procedure Laterality Date   ARTHOSCOPIC ROTAOR CUFF REPAIR     LEFT  10+  YEARS     CAROTID PTA/STENT INTERVENTION Left 10/19/2020   Procedure: CAROTID PTA/STENT INTERVENTION;  Surgeon: Katha Cabal, MD;  Location: [facility];  Service: Cardiovascular;  Laterality: Left;   CATARACT EXTRACTION W/ INTRAOCULAR LENS IMPLANT     RIGHT EYE   CATARACT EXTRACTION W/PHACO Left 03/28/2016   Procedure: CATARACT EXTRACTION PHACO AND INTRAOCULAR LENS PLACEMENT (IOC);  Surgeon: Leandrew Koyanagi, MD;  Location: [facility];  Service: Ophthalmology;  Laterality: Left;  TORIC   FACELIFT     FOOT SURGERY     rt foot   TUMOR REMOVED   JOINT REPLACEMENT Left Dec 2013   shoulder   LEFT HEART CATH AND CORONARY ANGIOGRAPHY N/A 06/12/2017   Procedure: LEFT HEART CATH AND CORONARY ANGIOGRAPHY;  Surgeon: Minna Merritts, MD;  Location: [facility];  Service: Cardiovascular;  Laterality: N/A;   RENAL ANGIOGRAPHY Left 01/07/2019   Procedure: RENAL ANGIOGRAPHY;  Surgeon: Katha Cabal, MD;   Location: [facility];  Service: Cardiovascular;  Laterality: Left;   RENAL ARTERY STENT     SKIN CANCER EXCISION     TOTAL SHOULDER ARTHROPLASTY  07/14/2012   Procedure: TOTAL SHOULDER ARTHROPLASTY;  Surgeon: Johnny Bridge, MD;  Location: [facility];  Service: Orthopedics;  Laterality: Left;     Home Meds: Prior to Admission medications   Medication Sig Start Date End Date Taking? Authorizing Provider  aspirin EC 81 MG tablet Take 81 mg by mouth daily.   Yes [provider]  bisoprolol (ZEBETA) 5 MG tablet Take 1 tablet (5 mg total) by mouth daily. Take once daily. May take an additional '5mg'$  once per day for fast heart rate. 08/17/22 11/15/22 Yes Carrie Mew, MD  Cholecalciferol (VITAMIN D-3) 1000 units CAPS Take 1,000 Units by mouth daily.   Yes [provider]  cyanocobalamin (VITAMIN B12) 1000 MCG tablet Take 1,000 mcg by mouth daily.   Yes [provider]  levothyroxine (SYNTHROID) 88 MCG tablet TAKE 1 TABLET(88 MCG) BY MOUTH DAILY BEFORE BREAKFAST Patient taking differently: Take 88 mcg by mouth at bedtime. 03/10/22  Yes Dutch Quint B, FNP  omeprazole (PRILOSEC) 20 MG capsule TAKE 1 CAPSULE(20 MG) BY MOUTH EVERY MORNING AS NEEDED FOR HEARTBURN 06/13/22  Yes [name], MD  rosuvastatin (CRESTOR) 10 MG tablet Take 1 tablet (10 mg total) by mouth at bedtime. 03/12/22  Yes [name], MD  temazepam (RESTORIL) 15 MG capsule TAKE 1 CAPSULE BY MOUTH AT BEDTIME AS NEEDED SLEEP 03/05/22  Yes [name], MD  Turmeric 500 MG CAPS Take 500 mg by mouth daily.   Yes [provider]  furosemide (LASIX) 20 MG tablet Take 20 mg by mouth daily. Patient not taking: Reported on 08/17/2022 07/06/22   [provider]  ketoconazole (NIZORAL) 2 % cream Apply 1 Application topically at bedtime. Apply to the feet and between the toes every night. Patient not taking: Reported on 08/17/2022 02/07/22   Ralene Bathe, MD    Inpatient  Medications: Scheduled Meds:  aspirin EC  81 mg Oral Daily   levothyroxine  88 mcg Oral QHS   pantoprazole (PROTONIX) IV  40 mg Intravenous Q12H   rosuvastatin  10 mg Oral QHS   Continuous Infusions:  PRN Meds: acetaminophen **OR** acetaminophen, nitroGLYCERIN, temazepam  Allergies:   Allergies  Allergen Reactions   Alendronate Swelling    And nausea     Codeine    Lisinopril     Spike in BP   Metoprolol Other (See Comments)    Lethargy - PT CURRENTLY TAKING    Oxycodone     Other reaction(s): Other (See Comments) Altered mental status   Oxycontin [Oxycodone Hcl]     Altered mental status    Social History:   Social History   Socioeconomic History   Marital status: Widowed    Spouse name: Not on file   Number of children: Not on file   Years of education: Not on file   Highest education level: Not on file  Occupational History   Not on file  Tobacco Use   Smoking status: Former    Types: Cigarettes    Quit date: 07/30/1972    Years since quitting: 50.0   Smokeless tobacco: Never   Tobacco comments:    smoked for only a few months  Vaping Use   Vaping Use: Never used  Substance and Sexual Activity   Alcohol use: Yes    Comment: OCCAS WINE    Drug use: No   Sexual activity: Never  Other Topics Concern   Not on file  Social History Narrative   Not on file   Social Determinants of Health   Financial Resource Strain: Low Risk  (04/20/2020)   Overall Financial Resource Strain (CARDIA)    Difficulty of Paying Living Expenses: Not hard at all  Food Insecurity: No Food Insecurity (04/20/2020)   Hunger Vital Sign    Worried About Running Out of Food in the Last Year: Never true    [place] in the Last Year: Never true  Transportation Needs: No Transportation Needs (04/20/2020)   PRAPARE - Hydrologist (Medical): No    Lack of Transportation (Non-Medical): No  Physical Activity: Sufficiently Active (04/20/2020)   Exercise  Vital Sign    Days of Exercise per Week: 4 days    Minutes of Exercise per Session: 60 min  Stress: No Stress Concern Present (04/20/2020)   [place]    Feeling of Stress : Not at all  Social Connections: Unknown (04/20/2020)   Social Connection and Isolation Panel [NHANES]    Frequency of Communication with Friends and Family: More than three times a week    Frequency of Social Gatherings with Friends and Family: Not on file    Attends Religious Services: Not on file    Active Member of Clubs or Organizations: Not on file    Attends Archivist Meetings: Not on file    Marital Status: Not on file  Intimate Partner Violence: Not At Risk (04/20/2020)   Humiliation, Afraid, Rape, and Kick questionnaire    Fear of Current or Ex-Partner: No    Emotionally Abused: No    Physically Abused: No    Sexually Abused: No     Family History:   Family History  Problem Relation Age of Onset   Cancer Daughter        Breast   Breast cancer Daughter 53   Cancer Son     ROS:  Review of Systems  Constitutional:  Positive for malaise/fatigue. Negative for chills, diaphoresis, fever and weight loss.  HENT:  Negative for congestion.   Eyes:  Negative for discharge and redness.  Respiratory:  Negative for cough, sputum production, shortness of breath and wheezing.   Cardiovascular:  Positive for palpitations. Negative for chest pain, orthopnea, claudication, leg swelling and PND.  Gastrointestinal:  Negative for abdominal pain, heartburn, nausea and vomiting.  Musculoskeletal:  Negative for falls and myalgias.  Skin:  Negative for rash.  Neurological:  Positive for dizziness and weakness. Negative for tingling, tremors, sensory change, speech change, focal weakness and loss of consciousness.       Near syncope  Endo/Heme/Allergies:  Does not bruise/bleed easily.  Psychiatric/Behavioral:  Negative for substance abuse. The  patient is not nervous/anxious.   All other systems reviewed and are negative.     Physical Exam/Data:   Vitals:   08/18/22 0200 08/18/22 0300 08/18/22 0616 08/18/22 0700  BP: (!) 115/58 122/65 (!) 152/67 (!) 145/44  Pulse: (!) 57 (!) 50 (!) 58 (!) 54  Resp: '16 17 13 16  '$ Temp:  97.9 F (36.6 C) 98.1 F (36.7 C)   TempSrc:  Oral Oral   SpO2: 95% 94% 96% 96%  Weight:      Height:       No intake or output data in the 24 hours ending 08/18/22 0759 Filed Weights   08/17/22 1512  Weight: 68.5 kg   Body mass index is 24.37 kg/m.   Physical Exam: General: Well developed, well nourished, in no acute distress. Head: Normocephalic, atraumatic, sclera non-icteric, no xanthomas, nares without discharge.  Neck: Negative for carotid bruits. JVD not elevated. Lungs: Clear bilaterally to auscultation without wheezes, rales, or rhonchi. Breathing is unlabored. Heart: RRR with S1 S2. II/VI murmur heard throughout, no rubs, or gallops appreciated. Abdomen: Soft, non-tender, non-distended with normoactive bowel sounds. No hepatomegaly. No rebound/guarding. No obvious abdominal masses. Msk:  Strength and tone appear normal for age. Extremities: No clubbing or cyanosis. No edema. Distal pedal pulses are 2+ and equal bilaterally. Neuro: Alert and oriented X 3. No facial asymmetry. No focal deficit. Moves all extremities spontaneously. Psych:  Responds to questions appropriately with a normal affect.   EKG:  The EKG was personally reviewed and demonstrates:, 73 bpm, LBBB (known).  Repeat EKG consistent with  sinus tachycardia versus atrial tachycardia, 136 bpm, LBBB Telemetry:  Telemetry was personally reviewed and demonstrates: SR with BBB, brief episode of atrial tachycardia around 1800 on 1/19, none since  Weights: Filed Weights   08/17/22 1512  Weight: 68.5 kg    Relevant CV Studies:  Limited echo 12/07/2021: 1. Left ventricular ejection fraction, by estimation, is 60 to 65%. The  left  ventricle has normal function. The left ventricle has no regional  wall motion abnormalities. There is moderat   2. There is moderate left ventricular hypertrophy with severe basal  septal hypertrophy. LVOT gradient noted, 39 mm Hg at rest, up to 52 mm Hg  with valsalva. One measurement estimating gradient at 127 mm Hg.   3. Right ventricular systolic function is normal. The right ventricular  size is normal. Tricuspid regurgitation signal is inadequate for assessing  PA pressure.   4. Left atrial size was mildly dilated.   5. The mitral valve is normal in structure. Moderate mitral valve  regurgitation. Severe mitral annular calcification.   6. The aortic valve is normal in structure. Aortic valve regurgitation is  not visualized. Aortic valve sclerosis/calcification is present, without  any evidence of aortic stenosis.   Comparison(s): 11/13/21 echo measured LVOT Vmax as 156 cm/s. __________   Carlton Adam MPI 11/27/2021:   The study is normal. The study is low risk.   No ST deviation was noted.   LV perfusion is normal. There is no evidence of ischemia. There is no evidence of infarction.   Left ventricular function is normal. End diastolic cavity size is normal. End systolic cavity size is normal.   Suboptimal study due to GI uptake.   CT attenuation images with mild aortic and coronary calcification. __________   2D echo 11/13/2021:  1. Left ventricular ejection fraction, by estimation, is 50 to 55%. The  left ventricle has low normal function. The left ventricle has no regional  wall motion abnormalities. There is mild concentric left ventricular  hypertrophy. Left ventricular  diastolic parameters are consistent with Grade I diastolic dysfunction  (impaired relaxation).   2. Right ventricular systolic function is normal. The right ventricular  size is normal.   3. The mitral valve is myxomatous. No evidence of mitral valve  regurgitation. Mild mitral stenosis.   4. The aortic  valve is normal in structure. Aortic valve regurgitation is  mild. __________   2D echo 06/26/2017: - Left ventricle: The cavity size was normal. Near cavity    obliteration in systole. There was mild focal basal and mild    concentric hypertrophy of the septum. Systolic function was    normal. The estimated ejection fraction was in the range of 55%    to 60%. Wall motion was normal; there were no regional wall    motion abnormalities. Doppler parameters are consistent with    abnormal left ventricular relaxation (grade 1 diastolic    dysfunction).  - Aortic valve: Transvalvular velocity was increased. There was    very mild stenosis.  - Mitral valve: Calcified annulus. There was moderate    regurgitation.  - Left atrium: The atrium was mildly dilated.  - Right ventricle: Systolic function was normal.  - Pulmonary arteries: Systolic pressure was moderately elevated. PA    peak pressure: 50 mm Hg (S). __________  [facility] 06/12/2017: Coronary dominance: Right  Left mainstem:   Large vessel that bifurcates into the LAD and left circumflex, Ostial 30% disease, calcified.  Left anterior descending (LAD):   Large vessel that  extends to the apical region, diagonal branch 2 of moderate size, mild lumenal irregularities  Left circumflex (LCx):  Large vessel with OM branch 2, moderate 50% mid vessel disease, calcified  Right coronary artery (RCA):  Right dominant vessel with PL and PDA, moderate focal disease, eccentric 50 to 60%  Left ventriculography: Left ventricular systolic function is normal/hyperdynamic, LVEF is estimated at 55-65%, there is no significant mitral regurgitation , no significant aortic valve stenosis  Final Conclusions:   Moderate mid LCX and RCA disease, Medical management recommended Normal/hyperdynamic function  Recommendations:  Follow up on echocardiogram Start metoprolol tartrate 25 mg po BID __________   Carlton Adam MPI 06/11/2017: Abnormal, potentially  high risk myocardial perfusion stress test. There is a moderate in size, mild in severity, reversible defect involving the mid inferolateral, apical lateral, and apical segments consistent with ischemia. There is a small in size, mild in severity, reversible defect involving the mid anterior segment, which may represent subtle ischemia and/or shifting breast attenuation. The left ventricular ejection fraction is normal (64%). Transient ischemic dilation is noted (TID 1.31), which is non-specific but can be seen balanced/multivessel ischemia. __________   2D echo 12/05/2015: - Left ventricle: The cavity size was normal. There was mild focal    basal and mild concentric hypertrophy of the septum, with mild    LVOT gradient. Systolic function was normal. The estimated    ejection fraction was in the range of 60% to 65%. Wall motion was    normal; there were no regional wall motion abnormalities. Doppler    parameters are consistent with abnormal left ventricular    relaxation (grade 1 diastolic dysfunction).  - Aorta: Aortic root with milldy dilated, dimension: 35 mm (ED).    Ascending aorta was mildly dilated, diameter: 35 mm (S).  - Mitral valve: There was mild regurgitation.  - Left atrium: The atrium was normal in size.  - Right ventricle: Systolic function was normal.  - Pulmonary arteries: Systolic pressure was within the normal    range.  - Inferior vena cava: The vessel was normal in size. The    respirophasic diameter changes were in the normal range (>= 50%),    consistent with normal central venous pressure.   Impressions:   - Murmur likely secondary to aortic valve sclerosis without    significant stenosis, and mild LVOT gradient.   Laboratory Data:  Chemistry Recent Labs  Lab 08/17/22 1513  NA 137  K 3.8  CL 104  CO2 23  GLUCOSE 116*  BUN 12  CREATININE 0.77  CALCIUM 9.1  GFRNONAA >60  ANIONGAP 10    No results for input(s): "PROT", "ALBUMIN", "AST", "ALT",  "ALKPHOS", "BILITOT" in the last 168 hours. Hematology Recent Labs  Lab 08/17/22 1513  WBC 8.3  RBC 4.63  HGB 12.7  HCT 40.1  MCV 86.6  MCH 27.4  MCHC 31.7  RDW 14.7  PLT 149*   Cardiac EnzymesNo results for input(s): "TROPONINI" in the last 168 hours. No results for input(s): "TROPIPOC" in the last 168 hours.  BNP Recent Labs  Lab 08/17/22 1513  BNP 710.6*    DDimer  Recent Labs  Lab 08/17/22 1514  DDIMER 1.22*    Radiology/Studies:  DG Chest 2 View  Result Date: 08/17/2022 IMPRESSION: No active cardiopulmonary disease. Electronically Signed   By: Titus Dubin M.D.   On: 08/17/2022 15:38    Assessment and Plan:   1. Atrial tachycardia with near syncope: -Symptoms overall improved following the initial dose of  bisoprolol which she seemed to tolerate without significant adverse effect -Add bisoprolol 5 mg daily, may need an extra dose prn for sustained palpitations  -Near syncope in the setting of underlying HOCM and new onset atrial tachycardia -We have recommended a mildly bradycardic heart rate, though she has historically been intolerant to beta blockers and nondihydropyridine calcium channel blocker  -TSH normal -Potassium and magnesium normal  2. HOCM with LBBB: -Previously, she has not tolerated carvedilol, metoprolol, or diltiazem secondary to myalgias and fatigue -She has indicated first-degree relatives have been screened  -Add bisoprolol as above -Given advanced age and comorbid conditions, she prefers to follow a conservative approach rather than referral to [facility] or consideration of myomectomy  -For these reasons, we will defer referral to EP for consideration of ICD (patient is a DNR), and cardiac MRI  3. Elevated high sensitivity troponin/elevated D-dimer: -Mildly elevated and not consistent with ACS -Elevated troponin in the setting of known LVH with HOCM and atrial tachycardia -Prior LHC and recent Lexiscan MPI without evidence of  obstructive CAD or ischemia respectively -No plans for inpatient ischemic testing -Elevated D-dimer per primary service  4. Labile HTN: -PTA amlodipine and prn hydralazine  -Add bisoprolol as above  5. Mitral valve regurgitation:  -Stable echo in 11/2021  6. Renal artery stenosis:  -Status post right renal artery stenting with most recent ultrasound in 04/2022 showing a patent right renal artery stent with no evidence of renal artery stenosis bilaterally -Followed by vascular surgery   7. History of SDH:  -Evaluated by neurosurgery with no intervention indicated    For questions or updates, please contact [facility] Please consult [email or web address] for contact info under Cardiology/STEMI.   Signed, Christell Faith, PA-C [facility] Pager: [phone] 08/18/2022, 7:59 AM

## 2022-08-19 ENCOUNTER — Telehealth: Payer: Self-pay | Admitting: Physician Assistant

## 2022-08-19 DIAGNOSIS — I4719 Other supraventricular tachycardia: Secondary | ICD-10-CM

## 2022-08-19 MED ORDER — BISOPROLOL FUMARATE 5 MG PO TABS: 5.0000 mg | ORAL_TABLET | Freq: Every day | ORAL | 1 refills | Status: DC

## 2022-08-19 NOTE — Telephone Encounter (Signed)
Patient contacted our office indicating [facility] had closed by the time she was discharged yesterday and was unable to pick up bisoprolol.  She had previously picked up recently prescribed hydralazine prior to her ED presentation so does not need hydralazine.  She asked that I send in her prescription for bisoprolol 2 [facility] in [place].

## 2022-08-20 NOTE — Progress Notes (Signed)
Cardiology Office Note    Date:  08/21/2022   ID:  Kathryn Wise, DOB Dec 08, 1929, MRN [id number]  PCP:  Gladstone Lighter, MD  Cardiologist:  Nelva Bush, MD  Electrophysiologist:  None   Chief Complaint: Hospital follow-up  History of Present Illness:   Kathryn Wise is a 89 y.o. female with history of nonobstructive CAD, HOCM, HFpEF, difficult to control hypertension with left renal artery stenosis status post stenting in 12/2018, carotid artery disease status post left sided stenting in 09/2020, possible small SDH noted in 10/2021, LBBB, HLD, and mitral regurgitation who presents for hospital follow up as below.   [name] was previously followed by Dr. [name] in 2017 for cardiac murmur with echo at that time demonstrating an EF of 60 to 65%, normal wall motion, mild focal basal and mild concentric LVH of the septum with mild LVOT gradient, grade 1 diastolic dysfunction, mildly dilated aortic root and ascending aorta, mild mitral regurgitation, normal RV systolic function and PASP.  She was admitted for chest pain in 05/2017 with Lexiscan being abnormal.  She subsequently underwent LHC in 05/2017, which demonstrated nonobstructive CAD involving the LCx and RCA with medical management recommended.  Echo in 05/2017 showed an EF of 55 to 60%, normal wall motion, mild focal basal and mild concentric LVH, near cavity obliteration in systole, grade 1 diastolic dysfunction, very mild aortic stenosis, moderate mitral regurgitation, mildly dilated left atrium, normal RV systolic function, and a PASP of 50 mmHg.  Following this, she was lost to follow-up.   She was admitted to the hospital in 09/2020 with pneumonia and HFpEF.  Echo demonstrated an EF of 55 to 60%, moderate LVH, grade 2 diastolic dysfunction with moderate LVOT obstruction with a peak gradient of 68 mmHg at rest and 74 mmHg with Valsalva, normal RV systolic function and ventricular cavity size, moderately elevated PASP estimated at 46.5  mmHg, moderately dilated left atrium, mild mitral regurgitation, mild mitral stenosis, moderate mitral annular calcification, and mild to moderate aortic valve sclerosis without evidence of stenosis.  High-sensitivity troponin trended to 976.  She was lost to follow-up.   She was admitted in 09/2021 with hypertensive urgency with noted improvement in BP following 10 mg of amlodipine and 5 mg of IV labetalol.  She was noted to have a mildly elevated high-sensitivity troponin peaking at 36 felt to be related to supply demand ischemia from hypertension.   She was admitted to the hospital in 10/2021 noting left upper extremity numbness that was transient, bilateral lower extremity swelling, and generalized weakness.  She was consulted on by neurosurgery for possible TIA.  MRI of the brain showed a possible 2 mm subdural hematoma overlying the right cerebral convexity.  This was not well visualized on CT imaging.  She was unaware of any fall/trauma.  There was recommended the patient continue aspirin without need for antiepileptic medication given lack of subdural hematoma noted on CT imaging.  Echo, as read by outside group, showed an EF of 50 to 55%, no regional wall motion normalities, mild concentric LVH, grade 1 diastolic dysfunction, normal RV systolic function and ventricular cavity size, myxomatous mitral valve with mild stenosis, and mild aortic insufficiency.  Lower extremity ultrasound was negative for DVT bilaterally.  High-sensitivity troponin 28 with a delta troponin of 29.  BNP 418.   She was seen in hospital follow-up on 11/20/2021 and was without symptoms of angina or decompensation at that time.  She did report an approximate 4-week history of  left jaw pain, and in the setting had multiple teeth extracted along that side without symptomatic improvement.  This discomfort was exacerbated by exertion, chewing, and yawning.  Subsequent Lexiscan MPI on 11/27/2021 was overall low risk without evidence of  infarction or ischemia.  CT attenuated corrected images showed mild coronary artery calcification and aortic atherosclerosis.  Echo on 12/07/2021 demonstrated an EF of 60 to 65%, no regional wall motion abnormalities, moderate LVH with severe basal septal hypertrophy with an LVOT gradient of 39 mmHg at rest and 52 mmHg with Valsalva with one measurement of 127 mmHg, normal RV systolic function and ventricular cavity size, mildly dilated left atrium, moderate mitral valve regurgitation with severe mitral annular calcification, and aortic valve sclerosis without evidence of stenosis.   She was seen in the office on 12/28/2021 and was without symptoms of angina or decompensation.  She did feel somewhat deconditioned as she had not been able to exercise regularly.  She was without symptoms of dizziness, presyncope, or syncope.  Her blood pressure was improved following the addition of Cardizem CD.  We subsequently titrated this to 240 mg daily.  She subsequently reported issues with pain all over, noting that slight "changes" led to a flareup of polymyalgia.  Given this, it was recommended she resume lower dose Cardizem 180 mg daily.  She subsequently discontinued this medication altogether indicating she was "feeling poorly."  She was later diagnosed with a sinus infection.  We recommended she resume Cardizem 180 mg daily due to elevated BP.   She was seen in the office in 01/2022 noting diffuse myalgias and limited mobility while on diltiazem.  She underwent intermittent washing out of this medication with improvement in symptoms while she was off diltiazem.  However, she did continue to take diltiazem with intermittent elevated blood pressure readings.  She requested a prednisone taper for her myalgias.  We transitioned her to low-dose carvedilol 3.125 mg twice daily.  She subsequently notified our office in 02/2022 that carvedilol was working well for her blood pressure, though causing fatigue and weakness leading it  to be discontinued.  She was placed back on amlodipine 10 mg daily, which she previously tolerated without issues.     She was seen in the office in 03/2022 noting an increase in bilateral lower extremity swelling that began after reinitiating amlodipine.  She noticed an improvement in her energy following discontinuation of carvedilol.  She preferred to remain on amlodipine 10 mg daily.  She was started on furosemide 20 mg daily with follow-up labs showing stable renal function.   She was seen by her PCP 08/17/2022 for elevated blood pressure and placed on hydralazine.  While at the hairdresser, later that day, while sitting on the hair dryer, she developed sudden onset of tachypalpitations with associated dizziness and near syncope.  No frank syncope.  Symptoms lasted a few seconds and spontaneously resolved.  She drove herself home.  While at home, she had another episode of tachypalpitations associated near syncope that persisted a little longer prompting her to contact EMS.  She never had chest pain or frank syncope.  She was admitted to [facility] from 1/19 through 08/18/2022 with atrial tachycardia and near syncope in the setting of HOCM.  High-sensitivity troponin peaked at 305 and was felt to represent demand ischemia in the setting of atrial tachycardia with known HOCM.  BNP 710.  With initiation of bisoprolol there was documented improvement in tachy-palpitation burden.  Patient discharged on bisoprolol 5 mg daily.  She comes  in doing well from a cardiac perspective and is without symptoms of angina or decompensation.  No further palpitations, dizziness, presyncope, or syncope.  No dyspnea.  She has tolerated the addition of bisoprolol without issues.  She has not needed any as needed hydralazine.  No lower extremity swelling, abdominal distention, or progressive orthopnea.   Labs independently reviewed: 07/2022 - magnesium 1.9, TSH normal, Hgb 12.7, PLT 149, potassium 3.8, BUN 12, serum creatinine  0.77 10/2021 - TC 115, TG 91, HDL 42, LDL 55, albumin 4.1, AST/ALT normal 08/2021 - A1c 6.4  Past Medical History:  Diagnosis Date   Arthritis    Collagen vascular disease ([place])    Dysphagia, pharyngoesophageal phase 09/10/2013   Upper GI study with barium swallow was done at  [facility] Mar 2015   No reflux seen  Small irreducible hiatal hernia  Mild changes of presbyeophagus (abnormal contractions of the esophagus that occur with aging) No strictures Normal gastric emptying  Incomplete visualization of stomach fold due to patient's inability turn     GERD (gastroesophageal reflux disease)    Heart murmur    has had years and years   History of shingles Dec 2013   treated with steroids , post op from shoulder surgery   History of squamous cell carcinoma 05/02/2016   right mid lateral pretibial   Hypertension    Hypothyroidism    Major depressive disorder, single episode 11/05/2015   Osteoarthritis of left shoulder 07/14/2012   Pneumonia 10/21/2020   Squamous cell carcinoma of skin 05/12/2016   R mid lat pretibial - other skin cancers treated by Dr. Sharlett Iles   Squamous cell carcinoma of skin 08/22/2020   left distal medial popliteal - [facility]    Past Surgical History:  Procedure Laterality Date   ARTHOSCOPIC ROTAOR CUFF REPAIR     LEFT  10+  YEARS     CAROTID PTA/STENT INTERVENTION Left 10/19/2020   Procedure: CAROTID PTA/STENT INTERVENTION;  Surgeon: Katha Cabal, MD;  Location: [place] [facility];  Service: Cardiovascular;  Laterality: Left;   CATARACT EXTRACTION W/ INTRAOCULAR LENS IMPLANT     RIGHT EYE   CATARACT EXTRACTION W/PHACO Left 03/28/2016   Procedure: CATARACT EXTRACTION PHACO AND INTRAOCULAR LENS PLACEMENT (IOC);  Surgeon: Leandrew Koyanagi, MD;  Location: [place];  Service: Ophthalmology;  Laterality: Left;  TORIC   FACELIFT     FOOT SURGERY     rt foot   TUMOR REMOVED   JOINT REPLACEMENT Left Dec 2013   shoulder   LEFT HEART CATH AND CORONARY  ANGIOGRAPHY N/A 06/12/2017   Procedure: LEFT HEART CATH AND CORONARY ANGIOGRAPHY;  Surgeon: Minna Merritts, MD;  Location: [place] [facility];  Service: Cardiovascular;  Laterality: N/A;   RENAL ANGIOGRAPHY Left 01/07/2019   Procedure: RENAL ANGIOGRAPHY;  Surgeon: Katha Cabal, MD;  Location: [place] [facility];  Service: Cardiovascular;  Laterality: Left;   RENAL ARTERY STENT     SKIN CANCER EXCISION     TOTAL SHOULDER ARTHROPLASTY  07/14/2012   Procedure: TOTAL SHOULDER ARTHROPLASTY;  Surgeon: Johnny Bridge, MD;  Location: [place];  Service: Orthopedics;  Laterality: Left;    Current Medications: Current Meds  Medication Sig   aspirin EC 81 MG tablet Take 81 mg by mouth daily.   bisoprolol (ZEBETA) 5 MG tablet Take 1 tablet (5 mg total) by mouth daily.   Cholecalciferol (VITAMIN D-3) 1000 units CAPS Take 1,000 Units by mouth daily.   cyanocobalamin (VITAMIN B12) 1000 MCG  tablet Take 1,000 mcg by mouth daily.   hydrALAZINE (APRESOLINE) 25 MG tablet Take 1 tablet (25 mg total) by mouth every 6 (six) hours as needed (pressure >160).   levothyroxine (SYNTHROID) 88 MCG tablet TAKE 1 TABLET(88 MCG) BY MOUTH DAILY BEFORE BREAKFAST (Patient taking differently: Take 88 mcg by mouth at bedtime.)   nitroGLYCERIN (NITROSTAT) 0.4 MG SL tablet Place 1 tablet (0.4 mg total) under the tongue every 5 (five) minutes as needed for chest pain.   omeprazole (PRILOSEC) 20 MG capsule TAKE 1 CAPSULE(20 MG) BY MOUTH EVERY MORNING AS NEEDED FOR HEARTBURN   rosuvastatin (CRESTOR) 10 MG tablet Take 1 tablet (10 mg total) by mouth at bedtime.   temazepam (RESTORIL) 15 MG capsule TAKE 1 CAPSULE BY MOUTH AT BEDTIME AS NEEDED SLEEP   Turmeric 500 MG CAPS Take 500 mg by mouth daily.    Allergies:   Alendronate, Codeine, Lisinopril, Metoprolol, Oxycodone, and Oxycontin [oxycodone hcl]   Social History   Socioeconomic History   Marital status: Widowed    Spouse name: Not on file   Number of children: Not  on file   Years of education: Not on file   Highest education level: Not on file  Occupational History   Not on file  Tobacco Use   Smoking status: Former    Types: Cigarettes    Quit date: 07/30/1972    Years since quitting: 50.0   Smokeless tobacco: Never   Tobacco comments:    smoked for only a few months  Vaping Use   Vaping Use: Never used  Substance and Sexual Activity   Alcohol use: Yes    Comment: OCCAS WINE    Drug use: No   Sexual activity: Never  Other Topics Concern   Not on file  Social History Narrative   Not on file   Social Determinants of Health   Financial Resource Strain: Low Risk  (04/20/2020)   Overall Financial Resource Strain (CARDIA)    Difficulty of Paying Living Expenses: Not hard at all  Food Insecurity: No Food Insecurity (04/20/2020)   Hunger Vital Sign    Worried About Running Out of Food in the Last Year: Never true    [place] in the Last Year: Never true  Transportation Needs: No Transportation Needs (04/20/2020)   PRAPARE - Hydrologist (Medical): No    Lack of Transportation (Non-Medical): No  Physical Activity: Sufficiently Active (04/20/2020)   Exercise Vital Sign    Days of Exercise per Week: 4 days    Minutes of Exercise per Session: 60 min  Stress: No Stress Concern Present (04/20/2020)   [place]    Feeling of Stress : Not at all  Social Connections: Unknown (04/20/2020)   Social Connection and Isolation Panel [NHANES]    Frequency of Communication with Friends and Family: More than three times a week    Frequency of Social Gatherings with Friends and Family: Not on file    Attends Religious Services: Not on file    Active Member of Clubs or Organizations: Not on file    Attends Archivist Meetings: Not on file    Marital Status: Not on file     Family History:  The patient's family history includes Breast cancer (age  of onset: 27) in her daughter; Cancer in her daughter and son.  ROS:   12-point review of systems is negative unless otherwise noted in  HPI.   EKGs/Labs/Other Studies Reviewed:    Studies reviewed were summarized above. The additional studies were reviewed today:  Limited echo 12/07/2021: 1. Left ventricular ejection fraction, by estimation, is 60 to 65%. The  left ventricle has normal function. The left ventricle has no regional  wall motion abnormalities. There is moderat   2. There is moderate left ventricular hypertrophy with severe basal  septal hypertrophy. LVOT gradient noted, 39 mm Hg at rest, up to 52 mm Hg  with valsalva. One measurement estimating gradient at 127 mm Hg.   3. Right ventricular systolic function is normal. The right ventricular  size is normal. Tricuspid regurgitation signal is inadequate for assessing  PA pressure.   4. Left atrial size was mildly dilated.   5. The mitral valve is normal in structure. Moderate mitral valve  regurgitation. Severe mitral annular calcification.   6. The aortic valve is normal in structure. Aortic valve regurgitation is  not visualized. Aortic valve sclerosis/calcification is present, without  any evidence of aortic stenosis.   Comparison(s): 11/13/21 echo measured LVOT Vmax as 156 cm/s. __________   [facility] MPI 11/27/2021:   The study is normal. The study is low risk.   No ST deviation was noted.   LV perfusion is normal. There is no evidence of ischemia. There is no evidence of infarction.   Left ventricular function is normal. End diastolic cavity size is normal. End systolic cavity size is normal.   Suboptimal study due to GI uptake.   CT attenuation images with mild aortic and coronary calcification. __________   2D echo 11/13/2021:  1. Left ventricular ejection fraction, by estimation, is 50 to 55%. The  left ventricle has low normal function. The left ventricle has no regional  wall motion abnormalities. There is  mild concentric left ventricular  hypertrophy. Left ventricular  diastolic parameters are consistent with Grade I diastolic dysfunction  (impaired relaxation).   2. Right ventricular systolic function is normal. The right ventricular  size is normal.   3. The mitral valve is myxomatous. No evidence of mitral valve  regurgitation. Mild mitral stenosis.   4. The aortic valve is normal in structure. Aortic valve regurgitation is  mild. __________   2D echo 06/26/2017: - Left ventricle: The cavity size was normal. Near cavity    obliteration in systole. There was mild focal basal and mild    concentric hypertrophy of the septum. Systolic function was    normal. The estimated ejection fraction was in the range of 55%    to 60%. Wall motion was normal; there were no regional wall    motion abnormalities. Doppler parameters are consistent with    abnormal left ventricular relaxation (grade 1 diastolic    dysfunction).  - Aortic valve: Transvalvular velocity was increased. There was    very mild stenosis.  - Mitral valve: Calcified annulus. There was moderate    regurgitation.  - Left atrium: The atrium was mildly dilated.  - Right ventricle: Systolic function was normal.  - Pulmonary arteries: Systolic pressure was moderately elevated. PA    peak pressure: 50 mm Hg (S). __________  [facility] 06/12/2017: Coronary dominance: Right  Left mainstem:   Large vessel that bifurcates into the LAD and left circumflex, Ostial 30% disease, calcified.  Left anterior descending (LAD):   Large vessel that extends to the apical region, diagonal branch 2 of moderate size, mild lumenal irregularities  Left circumflex (LCx):  Large vessel with OM branch 2, moderate 50% mid vessel  disease, calcified  Right coronary artery (RCA):  Right dominant vessel with PL and PDA, moderate focal disease, eccentric 50 to 60%  Left ventriculography: Left ventricular systolic function is normal/hyperdynamic, LVEF is  estimated at 55-65%, there is no significant mitral regurgitation , no significant aortic valve stenosis  Final Conclusions:   Moderate mid LCX and RCA disease, Medical management recommended Normal/hyperdynamic function  Recommendations:  Follow up on echocardiogram Start metoprolol tartrate 25 mg po BID __________   [facility] MPI 06/11/2017: Abnormal, potentially high risk myocardial perfusion stress test. There is a moderate in size, mild in severity, reversible defect involving the mid inferolateral, apical lateral, and apical segments consistent with ischemia. There is a small in size, mild in severity, reversible defect involving the mid anterior segment, which may represent subtle ischemia and/or shifting breast attenuation. The left ventricular ejection fraction is normal (64%). Transient ischemic dilation is noted (TID 1.31), which is non-specific but can be seen balanced/multivessel ischemia. __________   2D echo 12/05/2015: - Left ventricle: The cavity size was normal. There was mild focal    basal and mild concentric hypertrophy of the septum, with mild    LVOT gradient. Systolic function was normal. The estimated    ejection fraction was in the range of 60% to 65%. Wall motion was    normal; there were no regional wall motion abnormalities. Doppler    parameters are consistent with abnormal left ventricular    relaxation (grade 1 diastolic dysfunction).  - Aorta: Aortic root with milldy dilated, dimension: 35 mm (ED).    Ascending aorta was mildly dilated, diameter: 35 mm (S).  - Mitral valve: There was mild regurgitation.  - Left atrium: The atrium was normal in size.  - Right ventricle: Systolic function was normal.  - Pulmonary arteries: Systolic pressure was within the normal    range.  - Inferior vena cava: The vessel was normal in size. The    respirophasic diameter changes were in the normal range (>= 50%),    consistent with normal central venous pressure.    Impressions:   - Murmur likely secondary to aortic valve sclerosis without    significant stenosis, and mild LVOT gradient.   EKG:  EKG is ordered today.  The EKG ordered today demonstrates sinus bradycardia, 54 bpm, LBBB  Recent Labs: 11/13/2021: ALT 14 08/17/2022: B Natriuretic Peptide 710.6; BUN 12; Creatinine, Ser 0.77; Hemoglobin 12.7; Magnesium 1.9; Platelets 149; Potassium 3.8; Sodium 137; TSH 1.061  Recent Lipid Panel    Component Value Date/Time   CHOL 115 11/14/2021 0538   TRIG 91 11/14/2021 0538   HDL 42 11/14/2021 0538   CHOLHDL 2.7 11/14/2021 0538   VLDL 18 11/14/2021 0538   LDLCALC 55 11/14/2021 0538   LDLDIRECT 126.0 09/19/2016 1411    PHYSICAL EXAM:    VS:  BP 120/70 (BP Location: Left Arm, Patient Position: Sitting, Cuff Size: Normal)   Ht '5\' 5"'$  (1.651 m)   Wt 154 lb 8 oz (70.1 kg)   SpO2 96%   BMI 25.71 kg/m   BMI: Body mass index is 25.71 kg/m.  Physical Exam Vitals reviewed.  Constitutional:      Appearance: She is well-developed.  HENT:     Head: Normocephalic and atraumatic.  Eyes:     General:        Right eye: No discharge.        Left eye: No discharge.  Neck:     Vascular: No JVD.  Cardiovascular:  Rate and Rhythm: Regular rhythm. Bradycardia present.     Heart sounds: S1 normal and S2 normal. Heart sounds not distant. No midsystolic click and no opening snap. Murmur heard.     Systolic murmur is present with a grade of 2/6.     No friction rub.  Pulmonary:     Effort: Pulmonary effort is normal. No respiratory distress.     Breath sounds: Normal breath sounds. No decreased breath sounds, wheezing or rales.  Chest:     Chest wall: No tenderness.  Abdominal:     General: There is no distension.  Musculoskeletal:     Cervical back: Normal range of motion.     Right lower leg: No edema.     Left lower leg: No edema.  Skin:    General: Skin is warm and dry.     Nails: There is no clubbing.  Neurological:     Mental Status:  She is alert and oriented to person, place, and time.  Psychiatric:        Speech: Speech normal.        Behavior: Behavior normal.        Thought Content: Thought content normal.        Judgment: Judgment normal.     Wt Readings from Last 3 Encounters:  08/21/22 154 lb 8 oz (70.1 kg)  08/17/22 151 lb 0.2 oz (68.5 kg)  06/12/22 151 lb (68.5 kg)     ASSESSMENT & PLAN:   Atrial tachycardia with near syncope: Quiescent following the addition of bisoprolol 5 mg which will be continued.  Unable to definitively exclude alternative atrial tachyarrhythmias.  Place a Zio patch.  Symptoms exacerbated by HOCM.  HOCM with LBBB: Previously, she has noted intolerance to carvedilol, metoprolol, and diltiazem secondary to myalgias and fatigue.  We have historically wanted to run her heart rates mildly bradycardic in the setting of underlying HCOM.  For now, it appears she is tolerating bisoprolol which will be continued.  She indicates first-degree relatives have previously been screened.  Given advanced age and comorbid conditions, she has preferred to follow a conservative approach rather than referral to the [place] clinic or consideration of myomectomy.  For these reasons, we will defer referral to EP for consideration of ICD or cardiac MRI.  Elevated high-sensitivity troponin: Mildly elevated, noted during recent hospitalization and not consistent with ACS.  Never with chest pain.  Felt to represent supply/demand ischemia in the setting of known LVH with HOCM and atrial tachycardia.  Prior LHC and recent [place] MPI without evidence of obstructive CAD or ischemia, respectively.  No indication for ischemic testing at this time.  Labile HTN: Blood pressure well-controlled in the office.  She remains on bisoprolol with as needed hydralazine.  Mitral valve regurgitation: Stable on echo in 11/2021.  Renal artery stenosis: Status post right renal artery stenting with most recent ultrasound in 04/2022 showing  a patent right renal artery stent with no evidence of renal artery stenosis bilaterally.  Followed by vascular surgery.  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    Disposition: F/u with Dr. Saunders Revel or an APP in 2 months.   Medication Adjustments/Labs and Tests Ordered: Current medicines are reviewed at length with the patient today.  Concerns regarding medicines are outlined above. Medication changes, Labs and Tests ordered today are summarized above and listed in the Patient Instructions accessible in Encounters.   SignedChristell Faith, PA-C 08/21/2022 3:16 PM     [facility] [address] [place], [place] [address] [phone]

## 2022-08-21 ENCOUNTER — Ambulatory Visit: Payer: Medicare Other

## 2022-08-21 ENCOUNTER — Encounter: Payer: Self-pay | Admitting: Physician Assistant

## 2022-08-21 ENCOUNTER — Ambulatory Visit: Payer: Medicare Other | Attending: Internal Medicine | Admitting: Physician Assistant

## 2022-08-21 VITALS — BP 120/70 | Ht 65.0 in | Wt 154.5 lb

## 2022-08-21 DIAGNOSIS — I701 Atherosclerosis of renal artery: Secondary | ICD-10-CM | POA: Diagnosis not present

## 2022-08-21 DIAGNOSIS — Z8679 Personal history of other diseases of the circulatory system: Secondary | ICD-10-CM | POA: Diagnosis not present

## 2022-08-21 DIAGNOSIS — I447 Left bundle-branch block, unspecified: Secondary | ICD-10-CM | POA: Insufficient documentation

## 2022-08-21 DIAGNOSIS — R55 Syncope and collapse: Secondary | ICD-10-CM | POA: Insufficient documentation

## 2022-08-21 DIAGNOSIS — I2489 Other forms of acute ischemic heart disease: Secondary | ICD-10-CM | POA: Insufficient documentation

## 2022-08-21 DIAGNOSIS — I421 Obstructive hypertrophic cardiomyopathy: Secondary | ICD-10-CM | POA: Diagnosis not present

## 2022-08-21 DIAGNOSIS — I4719 Other supraventricular tachycardia: Secondary | ICD-10-CM

## 2022-08-21 DIAGNOSIS — R0989 Other specified symptoms and signs involving the circulatory and respiratory systems: Secondary | ICD-10-CM | POA: Diagnosis not present

## 2022-08-21 DIAGNOSIS — I059 Rheumatic mitral valve disease, unspecified: Secondary | ICD-10-CM | POA: Insufficient documentation

## 2022-08-21 NOTE — Patient Instructions (Signed)
Medication Instructions:  No changes at this time.   *If you need a refill on your cardiac medications before your next appointment, please call your pharmacy*   Lab Work: None  If you have labs (blood work) drawn today and your tests are completely normal, you will receive your results only by: [place] (if you have MyChart) OR A paper copy in the mail If you have any lab test that is abnormal or we need to change your treatment, we will call you to review the results.   Testing/Procedures: Your physician has recommended that you wear a Zio monitor.   This monitor is a medical device that records the heart's electrical activity. Doctors most often use these monitors to diagnose arrhythmias. Arrhythmias are problems with the speed or rhythm of the heartbeat. The monitor is a small device applied to your chest. You can wear one while you do your normal daily activities. While wearing this monitor if you have any symptoms to push the button and record what you felt. Once you have worn this monitor for the period of time provider prescribed (Usually 14 days), you will return the monitor device in the postage paid box. Once it is returned they will download the data collected and provide [place] with a report which the provider will then review and we will call you with those results. Important tips:  Avoid showering during the first 24 hours of wearing the monitor. Avoid excessive sweating to help maximize wear time. Do not submerge the device, no hot tubs, and no swimming pools. Keep any lotions or oils away from the patch. After 24 hours you may shower with the patch on. Take brief showers with your back facing the shower head.  Do not remove patch once it has been placed because that will interrupt data and decrease adhesive wear time. Push the button when you have any symptoms and write down what you were feeling. Once you have completed wearing your monitor, remove and place into box  which has postage paid and place in your outgoing mailbox.  If for some reason you have misplaced your box then call our office and we can provide another box and/or mail it off for you.      Follow-Up: At [facility], you and your health needs are our priority.  As part of our continuing mission to provide you with exceptional heart care, we have created designated Provider Care Teams.  These Care Teams include your primary Cardiologist (physician) and Advanced Practice Providers (APPs -  Physician Assistants and Nurse Practitioners) who all work together to provide you with the care you need, when you need it.   Your next appointment:   2 month(s)  Provider:   Nelva Bush, MD or Christell Faith, PA-C

## 2022-08-23 ENCOUNTER — Telehealth: Payer: Self-pay | Admitting: Internal Medicine

## 2022-08-23 DIAGNOSIS — I4719 Other supraventricular tachycardia: Secondary | ICD-10-CM

## 2022-08-23 NOTE — Telephone Encounter (Signed)
STAT if HR is under 50 or over 120 (normal HR is 60-100 beats per minute)  What is your heart rate? 53  Do you have a log of your heart rate readings (document readings)? Staying in the 50's  Do you have any other symptoms? Exhaustion   Pt states that she believes medication she was put on recently is making her HR stay on the lower side and would like advice on how to continue to take medication.

## 2022-08-23 NOTE — Telephone Encounter (Signed)
I spoke with the patient. She was discharged from [facility] on 08/18/22 after a brief admission for acute onset of palpitations and dizziness w/ near syncope.  Runs of tachycardia noted and symptoms felt to be exacerbated by HOCM. She was discharged on bisoprolol 5 mg once daily.  She called today with complaint of "feeling lousy" and HR's running in the low to mid 50's. Today BP (HR)- early AM: 132/? (54) & noonish: 172/? (50).  The patient advised that her vision was blurry, but no increased SOB/ dizziness/ lightheadedness.  She took her bisoprolol 5 mg dose this morning with breakfast and a hydralazine 25 mg dost around 1230.   She called stating she is not comfortable with her HR in the 50's, even though she knows Thurmond Butts is "ok with that." She does not like how she feels on the bisoprolol.   She was seen in the office on 08/21/22 and per Christell Faith, PA's note: HOCM with LBBB: Previously, she has noted intolerance to carvedilol, metoprolol, and diltiazem secondary to myalgias and fatigue.  We have historically wanted to run her heart rates mildly bradycardic in the setting of underlying HCOM.  For now, it appears she is tolerating bisoprolol which will be continued.  She indicates first-degree relatives have previously been screened.  Given advanced age and comorbid conditions, she has preferred to follow a conservative approach rather than referral to the [place] [facility] or consideration of myomectomy.  For these reasons, we will defer referral to EP for consideration of ICD or cardiac MRI.  The patient also states she received her heart monitor, but she IS NOT going to wear this as she exercises daily, most of which includes the pool and water aerobics.  She stated that not going below the waste line in the pool was not an option. She also spends time in the "dry sauna."  She is requesting a call back directly from [place] to discuss her medication. She inquired if she should just sent her monitor  back. I have asked her to hold on to this until she hears back from our office.  The patient voices understanding and is agreeable.

## 2022-08-24 MED ORDER — BISOPROLOL FUMARATE 5 MG PO TABS: ORAL_TABLET | ORAL | 1 refills | Status: DC

## 2022-08-24 NOTE — Telephone Encounter (Signed)
Confirmed with Laurine Blazer that I do not need to call the patient back for any reason. She is going to mail her Zio monitor back.

## 2022-08-24 NOTE — Telephone Encounter (Signed)
She reports bradycardic rates in the low 50s on bisoprolol 5 mg daily.  She is concerned that his right artery level despite our prior discussions of needing a bradycardic rate with HOCM.  She elects to hold bisoprolol today and start bisoprolol 2.5 mg daily on 08/25/2022.  No further near syncope.  No frank syncope.  No symptoms of decompensation.  Remains active with water aerobics.  She declines Zio patch.

## 2022-08-24 NOTE — Addendum Note (Signed)
Addended by: Alvis Lemmings C on: 08/24/2022 11:34 AM   Modules accepted: Orders

## 2022-09-05 DIAGNOSIS — T8484XA Pain due to internal orthopedic prosthetic devices, implants and grafts, initial encounter: Secondary | ICD-10-CM | POA: Diagnosis not present

## 2022-09-06 DIAGNOSIS — H04123 Dry eye syndrome of bilateral lacrimal glands: Secondary | ICD-10-CM | POA: Diagnosis not present

## 2022-09-06 DIAGNOSIS — H35373 Puckering of macula, bilateral: Secondary | ICD-10-CM | POA: Diagnosis not present

## 2022-09-06 DIAGNOSIS — Z961 Presence of intraocular lens: Secondary | ICD-10-CM | POA: Diagnosis not present

## 2022-09-08 ENCOUNTER — Other Ambulatory Visit: Payer: Self-pay | Admitting: Family

## 2022-09-17 DIAGNOSIS — I6522 Occlusion and stenosis of left carotid artery: Secondary | ICD-10-CM | POA: Diagnosis not present

## 2022-09-17 DIAGNOSIS — I4719 Other supraventricular tachycardia: Secondary | ICD-10-CM | POA: Diagnosis not present

## 2022-09-17 DIAGNOSIS — I421 Obstructive hypertrophic cardiomyopathy: Secondary | ICD-10-CM | POA: Diagnosis not present

## 2022-09-17 DIAGNOSIS — Z780 Asymptomatic menopausal state: Secondary | ICD-10-CM | POA: Diagnosis not present

## 2022-09-17 DIAGNOSIS — Z09 Encounter for follow-up examination after completed treatment for conditions other than malignant neoplasm: Secondary | ICD-10-CM | POA: Diagnosis not present

## 2022-09-17 DIAGNOSIS — I10 Essential (primary) hypertension: Secondary | ICD-10-CM | POA: Diagnosis not present

## 2022-09-25 DIAGNOSIS — M810 Age-related osteoporosis without current pathological fracture: Secondary | ICD-10-CM | POA: Diagnosis not present

## 2022-10-22 ENCOUNTER — Ambulatory Visit: Payer: Medicare Other | Attending: Physician Assistant | Admitting: Physician Assistant

## 2022-10-22 ENCOUNTER — Ambulatory Visit (INDEPENDENT_AMBULATORY_CARE_PROVIDER_SITE_OTHER): Payer: Medicare Other | Admitting: Dermatology

## 2022-10-22 VITALS — BP 138/83 | HR 73

## 2022-10-22 DIAGNOSIS — L820 Inflamed seborrheic keratosis: Secondary | ICD-10-CM

## 2022-10-22 DIAGNOSIS — L578 Other skin changes due to chronic exposure to nonionizing radiation: Secondary | ICD-10-CM

## 2022-10-22 DIAGNOSIS — L821 Other seborrheic keratosis: Secondary | ICD-10-CM | POA: Diagnosis not present

## 2022-10-22 NOTE — Progress Notes (Deleted)
Cardiology Office Note    Date:  10/22/2022   ID:  Kathryn Wise, DOB 11/30/1929, MRN [id number]  PCP:  Gladstone Lighter, MD  Cardiologist:  Nelva Bush, MD  Electrophysiologist:  None   Chief Complaint: Follow-up  History of Present Illness:   Kathryn Wise is a 89 y.o. female with history of nonobstructive CAD, HOCM, HFpEF, difficult to control hypertension with left renal artery stenosis status post stenting in 12/2018, carotid artery disease status post left sided stenting in 09/2020, possible small SDH noted in 10/2021, LBBB, HLD, and mitral regurgitation who presents for follow up of the above.    Kathryn Wise was previously followed by Kathryn Wise in 2017 for cardiac murmur with echo at that time demonstrating an EF of 60 to 65%, normal wall motion, mild focal basal and mild concentric LVH of the septum with mild LVOT gradient, grade 1 diastolic dysfunction, mildly dilated aortic root and ascending aorta, mild mitral regurgitation, normal RV systolic function and PASP.  She was admitted for chest pain in 05/2017 with Lexiscan being abnormal.  She subsequently underwent LHC in 05/2017, which demonstrated nonobstructive CAD involving the LCx and RCA with medical management recommended.  Echo in 05/2017 showed an EF of 55 to 60%, normal wall motion, mild focal basal and mild concentric LVH, near cavity obliteration in systole, grade 1 diastolic dysfunction, very mild aortic stenosis, moderate mitral regurgitation, mildly dilated left atrium, normal RV systolic function, and a PASP of 50 mmHg.  Following this, she was lost to follow-up.   She was admitted to the hospital in 09/2020 with pneumonia and HFpEF.  Echo demonstrated an EF of 55 to 60%, moderate LVH, grade 2 diastolic dysfunction with moderate LVOT obstruction with a peak gradient of 68 mmHg at rest and 74 mmHg with Valsalva, normal RV systolic function and ventricular cavity size, moderately elevated PASP estimated at 46.5 mmHg,  moderately dilated left atrium, mild mitral regurgitation, mild mitral stenosis, moderate mitral annular calcification, and mild to moderate aortic valve sclerosis without evidence of stenosis.  High-sensitivity troponin trended to 976.  She was lost to follow-up.   She was admitted in 09/2021 with hypertensive urgency with noted improvement in BP following 10 mg of amlodipine and 5 mg of IV labetalol.  She was noted to have a mildly elevated high-sensitivity troponin peaking at 36 felt to be related to supply demand ischemia from hypertension.   She was admitted to the hospital in 10/2021 noting left upper extremity numbness that was transient, bilateral lower extremity swelling, and generalized weakness.  She was consulted on by neurosurgery for possible TIA.  MRI of the brain showed a possible 2 mm subdural hematoma overlying the right cerebral convexity.  This was not well visualized on CT imaging.  She was unaware of any fall/trauma.  There was recommended the patient continue aspirin without need for antiepileptic medication given lack of subdural hematoma noted on CT imaging.  Echo, as read by outside group, showed an EF of 50 to 55%, no regional wall motion normalities, mild concentric LVH, grade 1 diastolic dysfunction, normal RV systolic function and ventricular cavity size, myxomatous mitral valve with mild stenosis, and mild aortic insufficiency.  Lower extremity ultrasound was negative for DVT bilaterally.  High-sensitivity troponin 28 with a delta troponin of 29.  BNP 418.   She was seen in hospital follow-up on 11/20/2021 and was without symptoms of angina or decompensation at that time.  She did report an approximate 4-week history of  left jaw pain, and in the setting had multiple teeth extracted along that side without symptomatic improvement.  This discomfort was exacerbated by exertion, chewing, and yawning.  Subsequent Lexiscan MPI on 11/27/2021 was overall low risk without evidence of  infarction or ischemia.  CT attenuated corrected images showed mild coronary artery calcification and aortic atherosclerosis.  Echo on 12/07/2021 demonstrated an EF of 60 to 65%, no regional wall motion abnormalities, moderate LVH with severe basal septal hypertrophy with an LVOT gradient of 39 mmHg at rest and 52 mmHg with Valsalva with one measurement of 127 mmHg, normal RV systolic function and ventricular cavity size, mildly dilated left atrium, moderate mitral valve regurgitation with severe mitral annular calcification, and aortic valve sclerosis without evidence of stenosis.   She was seen in the office on 12/28/2021 and was without symptoms of angina or decompensation.  She did feel somewhat deconditioned as she had not been able to exercise regularly.  She was without symptoms of dizziness, presyncope, or syncope.  Her blood pressure was improved following the addition of Cardizem CD.  We subsequently titrated this to 240 mg daily.  She subsequently reported issues with pain all over, noting that slight "changes" led to a flareup of polymyalgia.  Given this, it was recommended she resume lower dose Cardizem 180 mg daily.  She subsequently discontinued this medication altogether indicating she was "feeling poorly."  She was later diagnosed with a sinus infection.  We recommended she resume Cardizem 180 mg daily due to elevated BP.   She was seen in the office in 01/2022 noting diffuse myalgias and limited mobility while on diltiazem.  She underwent intermittent washing out of this medication with improvement in symptoms while she was off diltiazem.  However, she did continue to take diltiazem with intermittent elevated blood pressure readings.  She requested a prednisone taper for her myalgias.  We transitioned her to low-dose carvedilol 3.125 mg twice daily.  She subsequently notified our office in 02/2022 that carvedilol was working well for her blood pressure, though causing fatigue and weakness leading it  to be discontinued.  She was placed back on amlodipine 10 mg daily, which she previously tolerated without issues.     She was seen in the office in 03/2022 noting an increase in bilateral lower extremity swelling that began after reinitiating amlodipine.  She noticed an improvement in her energy following discontinuation of carvedilol.  She preferred to remain on amlodipine 10 mg daily.  She was started on furosemide 20 mg daily with follow-up labs showing stable renal function.   She was seen by her PCP 08/17/2022 for elevated blood pressure and placed on hydralazine.  While at the hairdresser, later that day, while sitting on the hair dryer, she developed sudden onset of tachypalpitations with associated dizziness and near syncope.  No frank syncope.  Symptoms lasted a few seconds and spontaneously resolved.  She drove herself home.  While at home, she had another episode of tachypalpitations associated near syncope that persisted a little longer prompting her to contact EMS.  She never had chest pain or frank syncope.  She was [facility] to [facility] 1/[facility] through 08/18/2022 with atrial tachycardia and near syncope in the setting of HOCM.  High-sensitivity troponin peaked at 305 and was felt to represent demand ischemia in the setting of atrial tachycardia with known HOCM.  BNP 710.  With initiation of bisoprolol there was documented improvement in tachy-palpitation burden.  Patient discharged on bisoprolol 5 mg daily.  She was  seen in hospital follow-up on 08/21/2022 and was without symptoms of angina or decompensation.  No further palpitations, dizziness, or presyncope.  She was tolerating bisoprolol without issues.  She had not needed any as needed hydralazine.  Zio patch was placed and remains pending at this time.  She contacted our office on 08/23/2022 noting increased fatigue with heart rates in the 50s bpm.  In this setting, bisoprolol was decreased to 2.5 mg daily.  ***   Labs independently  reviewed: 07/2022 - magnesium 1.9, TSH normal, Hgb 12.7, PLT 149, potassium 3.8, BUN 12, serum creatinine 0.77 10/2021 - TC 115, TG 91, HDL 42, LDL 55, albumin 4.1, AST/ALT normal 08/2021 - A1c 6.4  Past Medical History:  Diagnosis Date   Arthritis    Collagen vascular disease ([place])    Dysphagia, pharyngoesophageal phase 09/10/2013   Upper GI study with barium swallow was done at  [facility] 2015   No reflux seen  Small irreducible hiatal hernia  Mild changes of presbyeophagus (abnormal contractions of the esophagus that occur with aging) No strictures Normal gastric emptying  Incomplete visualization of stomach fold due to patient's inability turn     GERD (gastroesophageal reflux disease)    Heart murmur    has had years and years   History of shingles Dec 2013   treated with steroids , post op from shoulder surgery   History of squamous cell carcinoma 05/02/2016   right mid lateral pretibial   Hypertension    Hypothyroidism    Major depressive disorder, single episode 11/05/2015   Osteoarthritis of left shoulder 07/14/2012   Pneumonia 10/21/2020   Squamous cell carcinoma of skin 05/12/2016   R mid lat pretibial - other skin cancers treated by Dr. Sharlett Iles   Squamous cell carcinoma of skin 08/22/2020   left distal medial popliteal - [facility]    Past Surgical History:  Procedure Laterality Date   ARTHOSCOPIC ROTAOR CUFF REPAIR     LEFT  10+  YEARS     CAROTID PTA/STENT INTERVENTION Left 10/19/2020   Procedure: CAROTID PTA/STENT INTERVENTION;  Surgeon: Katha Cabal, MD;  Location: [place] [facility];  Service: Cardiovascular;  Laterality: Left;   CATARACT EXTRACTION W/ INTRAOCULAR LENS IMPLANT     RIGHT EYE   CATARACT EXTRACTION W/PHACO Left 03/28/2016   Procedure: CATARACT EXTRACTION PHACO AND INTRAOCULAR LENS PLACEMENT (IOC);  Surgeon: Leandrew Koyanagi, MD;  Location: [place];  Service: Ophthalmology;  Laterality: Left;  TORIC   FACELIFT     FOOT SURGERY     rt foot    TUMOR REMOVED   JOINT REPLACEMENT Left Dec 2013   shoulder   LEFT HEART CATH AND CORONARY ANGIOGRAPHY N/A 06/12/2017   Procedure: LEFT HEART CATH AND CORONARY ANGIOGRAPHY;  Surgeon: Minna Merritts, MD;  Location: [place] [facility];  Service: Cardiovascular;  Laterality: N/A;   RENAL ANGIOGRAPHY Left 01/07/2019   Procedure: RENAL ANGIOGRAPHY;  Surgeon: Katha Cabal, MD;  Location: [place] [facility];  Service: Cardiovascular;  Laterality: Left;   RENAL ARTERY STENT     SKIN CANCER EXCISION     TOTAL SHOULDER ARTHROPLASTY  07/14/2012   Procedure: TOTAL SHOULDER ARTHROPLASTY;  Surgeon: Johnny Bridge, MD;  Location: [place];  Service: Orthopedics;  Laterality: Left;    Current Medications: No outpatient medications have been marked as taking for the 10/22/22 encounter (Appointment) with Rise Mu, PA-C.    Allergies:   Alendronate, Codeine, Lisinopril, Metoprolol, Oxycodone, and Oxycontin [oxycodone  hcl]   Social History   Socioeconomic History   Marital status: Widowed    Spouse name: Not on file   Number of children: Not on file   Years of education: Not on file   Highest education level: Not on file  Occupational History   Not on file  Tobacco Use   Smoking status: Former    Types: Cigarettes    Quit date: 07/30/1972    Years since quitting: 50.2   Smokeless tobacco: Never   Tobacco comments:    smoked for only a few months  Vaping Use   Vaping Use: Never used  Substance and Sexual Activity   Alcohol use: Yes    Comment: OCCAS WINE    Drug use: No   Sexual activity: Never  Other Topics Concern   Not on file  Social History Narrative   Not on file   Social Determinants of Health   Financial Resource Strain: Low Risk  (04/20/2020)   Overall Financial Resource Strain (CARDIA)    Difficulty of Paying Living Expenses: Not hard at all  Food Insecurity: No Food Insecurity (04/20/2020)   Hunger Vital Sign    Worried About Running Out of Food in the Last  Year: Never true    [place] in the Last Year: Never true  Transportation Needs: No Transportation Needs (04/20/2020)   PRAPARE - Hydrologist (Medical): No    Lack of Transportation (Non-Medical): No  Physical Activity: Sufficiently Active (04/20/2020)   Exercise Vital Sign    Days of Exercise per Week: 4 days    Minutes of Exercise per Session: 60 min  Stress: No Stress Concern Present (04/20/2020)   [place]    Feeling of Stress : Not at all  Social Connections: Unknown (04/20/2020)   Social Connection and Isolation Panel [NHANES]    Frequency of Communication with Friends and Family: More than three times a week    Frequency of Social Gatherings with Friends and Family: Not on file    Attends Religious Services: Not on file    Active Member of Clubs or Organizations: Not on file    Attends Archivist Meetings: Not on file    Marital Status: Not on file     Family History:  The patient's family history includes Breast cancer (age of onset: 12) in her daughter; Cancer in her daughter and son.  ROS:   12-point review of systems is negative unless otherwise noted in the HPI.   EKGs/Labs/Other Studies Reviewed:    Studies reviewed were summarized above. The additional studies were reviewed today:  Limited echo 12/07/2021: 1. Left ventricular ejection fraction, by estimation, is 60 to 65%. The  left ventricle has normal function. The left ventricle has no regional  wall motion abnormalities. There is moderat   2. There is moderate left ventricular hypertrophy with severe basal  septal hypertrophy. LVOT gradient noted, 39 mm Hg at rest, up to 52 mm Hg  with valsalva. One measurement estimating gradient at 127 mm Hg.   3. Right ventricular systolic function is normal. The right ventricular  size is normal. Tricuspid regurgitation signal is inadequate for assessing  PA  pressure.   4. Left atrial size was mildly dilated.   5. The mitral valve is normal in structure. Moderate mitral valve  regurgitation. Severe mitral annular calcification.   6. The aortic valve is normal in structure. Aortic valve  regurgitation is  not visualized. Aortic valve sclerosis/calcification is present, without  any evidence of aortic stenosis.   Comparison(s): 11/13/21 echo measured LVOT Vmax as 156 cm/s. __________   [facility] MPI 11/27/2021:   The study is normal. The study is low risk.   No ST deviation was noted.   LV perfusion is normal. There is no evidence of ischemia. There is no evidence of infarction.   Left ventricular function is normal. End diastolic cavity size is normal. End systolic cavity size is normal.   Suboptimal study due to GI uptake.   CT attenuation images with mild aortic and coronary calcification. __________   2D echo 11/13/2021:  1. Left ventricular ejection fraction, by estimation, is 50 to 55%. The  left ventricle has low normal function. The left ventricle has no regional  wall motion abnormalities. There is mild concentric left ventricular  hypertrophy. Left ventricular  diastolic parameters are consistent with Grade I diastolic dysfunction  (impaired relaxation).   2. Right ventricular systolic function is normal. The right ventricular  size is normal.   3. The mitral valve is myxomatous. No evidence of mitral valve  regurgitation. Mild mitral stenosis.   4. The aortic valve is normal in structure. Aortic valve regurgitation is  mild. __________   2D echo 06/26/2017: - Left ventricle: The cavity size was normal. Near cavity    obliteration in systole. There was mild focal basal and mild    concentric hypertrophy of the septum. Systolic function was    normal. The estimated ejection fraction was in the range of 55%    to 60%. Wall motion was normal; there were no regional wall    motion abnormalities. Doppler parameters are consistent  with    abnormal left ventricular relaxation (grade 1 diastolic    dysfunction).  - Aortic valve: Transvalvular velocity was increased. There was    very mild stenosis.  - Mitral valve: Calcified annulus. There was moderate    regurgitation.  - Left atrium: The atrium was mildly dilated.  - Right ventricle: Systolic function was normal.  - Pulmonary arteries: Systolic pressure was moderately elevated. PA    peak pressure: 50 mm Hg (S). __________  [facility] 06/12/2017: Coronary dominance: Right  Left mainstem:   Large vessel that bifurcates into the LAD and left circumflex, Ostial 30% disease, calcified.  Left anterior descending (LAD):   Large vessel that extends to the apical region, diagonal branch 2 of moderate size, mild lumenal irregularities  Left circumflex (LCx):  Large vessel with OM branch 2, moderate 50% mid vessel disease, calcified  Right coronary artery (RCA):  Right dominant vessel with PL and PDA, moderate focal disease, eccentric 50 to 60%  Left ventriculography: Left ventricular systolic function is normal/hyperdynamic, LVEF is estimated at 55-65%, there is no significant mitral regurgitation , no significant aortic valve stenosis  Final Conclusions:   Moderate mid LCX and RCA disease, Medical management recommended Normal/hyperdynamic function  Recommendations:  Follow up on echocardiogram Start metoprolol tartrate 25 mg po BID __________   [facility] Adam MPI 06/11/2017: Abnormal, potentially high risk myocardial perfusion stress test. There is a moderate in size, mild in severity, reversible defect involving the mid inferolateral, apical lateral, and apical segments consistent with ischemia. There is a small in size, mild in severity, reversible defect involving the mid anterior segment, which may represent subtle ischemia and/or shifting breast attenuation. The left ventricular ejection fraction is normal (64%). Transient ischemic dilation is noted (TID 1.31),  which is non-specific but  can be seen balanced/multivessel ischemia. __________   2D echo 12/05/2015: - Left ventricle: The cavity size was normal. There was mild focal    basal and mild concentric hypertrophy of the septum, with mild    LVOT gradient. Systolic function was normal. The estimated    ejection fraction was in the range of 60% to 65%. Wall motion was    normal; there were no regional wall motion abnormalities. Doppler    parameters are consistent with abnormal left ventricular    relaxation (grade 1 diastolic dysfunction).  - Aorta: Aortic root with milldy dilated, dimension: 35 mm (ED).    Ascending aorta was mildly dilated, diameter: 35 mm (S).  - Mitral valve: There was mild regurgitation.  - Left atrium: The atrium was normal in size.  - Right ventricle: Systolic function was normal.  - Pulmonary arteries: Systolic pressure was within the normal    range.  - Inferior vena cava: The vessel was normal in size. The    respirophasic diameter changes were in the normal range (>= 50%),    consistent with normal central venous pressure.   Impressions:   - Murmur likely secondary to aortic valve sclerosis without    significant stenosis, and mild LVOT gradient.   EKG:  EKG is ordered today.  The EKG ordered today demonstrates ***  Recent Labs: 11/13/2021: ALT 14 08/17/2022: B Natriuretic Peptide 710.6; BUN 12; Creatinine, Ser 0.77; Hemoglobin 12.7; Magnesium 1.9; Platelets 149; Potassium 3.8; Sodium 137; TSH 1.061  Recent Lipid Panel    Component Value Date/Time   CHOL 115 11/14/2021 0538   TRIG 91 11/14/2021 0538   HDL 42 11/14/2021 0538   CHOLHDL 2.7 11/14/2021 0538   VLDL 18 11/14/2021 0538   LDLCALC 55 11/14/2021 0538   LDLDIRECT 126.0 09/19/2016 1411    PHYSICAL EXAM:    VS:  There were no vitals taken for this visit.  BMI: There is no height or weight on file to calculate BMI.  Physical Exam  Wt Readings from Last 3 Encounters:  08/21/22 154 lb 8 oz  (70.1 kg)  08/17/22 151 lb 0.2 oz (68.5 kg)  06/12/22 151 lb (68.5 kg)     ASSESSMENT & PLAN:   Atrial tachycardia with near syncope:  HOCM with LBBB:  Labile HTN: Blood pressure  Mitral valve regurgitation:  Renal artery stenosis:  History of SDH:   {Are you ordering a CV Procedure (e.g. stress test, cath, DCCV, TEE, etc)?   Press F2        :[id number]     Disposition: F/u with Dr. Saunders Revel or an APP in ***.   Medication Adjustments/Labs and Tests Ordered: Current medicines are reviewed at length with the patient today.  Concerns regarding medicines are outlined above. Medication changes, Labs and Tests ordered today are summarized above and listed in the Patient Instructions accessible in Encounters.   Signed, Christell Faith, PA-C 10/22/2022 8:13 AM     [place] [address] [place] [address] [place],  [address] [phone]

## 2022-10-22 NOTE — Progress Notes (Unsigned)
   Follow-Up Visit   Subjective  Kathryn Wise is a 89 y.o. female who presents for the following: The patient has spots on her face, chest, back and legs to be evaluated, some may be new or changing and the patient has concerns that these could be cancer.  The following portions of the chart were reviewed this encounter and updated as appropriate: medications, allergies, medical history  Review of Systems:  No other skin or systemic complaints except as noted in HPI or Assessment and Plan.  Objective  Well appearing patient in no apparent distress; mood and affect are within normal limits.  A focused examination was performed of the following areas: face,chest,legs    Assessment & Plan   INFLAMED SEBORRHEIC KERATOSIS Exam: Stuck-on, waxy, tan-brown papules Symptomatic, irritating, patient would like treated. Benign-appearing.  Call clinic for new or changing lesions.   Prior to procedure, discussed risks of blister formation, small wound, skin dyspigmentation, or rare scar following treatment. Recommend Vaseline ointment to treated areas while healing.  Destruction Procedure Note Destruction method: cryotherapy   Informed consent: discussed and consent obtained   Lesion destroyed using liquid nitrogen: Yes   Outcome: patient tolerated procedure well with no complications   Post-procedure details: wound care instructions given   Locations: right pretibial x 1, left pretibial x 1, left chest x 3, back x 5, face x 7, left neck x 3 # of Lesions Treated: 20   Seborrheic Keratoses - Stuck-on, waxy, tan-brown papules and/or plaques  - Benign-appearing - Discussed benign etiology and prognosis. - Observe - Call for any changes  Actinic Damage - chronic, secondary to cumulative UV radiation exposure/sun exposure over time - diffuse scaly erythematous macules with underlying dyspigmentation - Recommend daily broad spectrum sunscreen SPF 30+ to sun-exposed areas, reapply every 2  hours as needed.  - Recommend staying in the shade or wearing long sleeves, sun glasses (UVA+UVB protection) and wide brim hats (4-inch brim around the entire circumference of the hat). - Call for new or changing lesions.    Return in about 1 year (around 10/22/2023) for TBSE, hx of SCC.  IMarye Round, CMA, am acting as scribe for Sarina Ser, MD .   Documentation: I have reviewed the above documentation for accuracy and completeness, and I agree with the above.  Sarina Ser, MD

## 2022-10-22 NOTE — Patient Instructions (Addendum)
Cryotherapy Aftercare  Wash gently with soap and water everyday.   Apply Vaseline and Band-Aid daily until healed.     Due to recent changes in healthcare laws, you may see results of your pathology and/or laboratory studies on MyChart before the doctors have had a chance to review them. We understand that in some cases there may be results that are confusing or concerning to you. Please understand that not all results are received at the same time and often the doctors may need to interpret multiple results in order to provide you with the best plan of care or course of treatment. Therefore, we ask that you please give us 2 business days to thoroughly review all your results before contacting the office for clarification. Should we see a critical lab result, you will be contacted sooner.   If You Need Anything After Your Visit  If you have any questions or concerns for your doctor, please call our main line at [phone] and press option 4 to reach your doctor's medical assistant. If no one answers, please leave a voicemail as directed and we will return your call as soon as possible. Messages left after 4 pm will be answered the following business day.   You may also send us a message via MyChart. We typically respond to MyChart messages within 1-2 business days.  For prescription refills, please ask your pharmacy to contact our office. Our fax number is [phone].  If you have an urgent issue when the clinic is closed that cannot wait until the next business day, you can page your doctor at the number below.    Please note that while we do our best to be available for urgent issues outside of office hours, we are not available 24/7.   If you have an urgent issue and are unable to reach us, you may choose to seek medical care at your doctor's office, retail clinic, urgent care center, or emergency room.  If you have a medical emergency, please immediately call 911 or go to the  emergency department.  Pager Numbers  - Dr. Kowalski: [phone]  - Dr. Moye: [phone]  - Dr. Stewart: [phone]  In the event of inclement weather, please call our main line at [phone] for an update on the status of any delays or closures.  Dermatology Medication Tips: Please keep the boxes that topical medications come in in order to help keep track of the instructions about where and how to use these. Pharmacies typically print the medication instructions only on the boxes and not directly on the medication tubes.   If your medication is too expensive, please contact our office at [phone] option 4 or send us a message through MyChart.   We are unable to tell what your co-pay for medications will be in advance as this is different depending on your insurance coverage. However, we may be able to find a substitute medication at lower cost or fill out paperwork to get insurance to cover a needed medication.   If a prior authorization is required to get your medication covered by your insurance company, please allow us 1-2 business days to complete this process.  Drug prices often vary depending on where the prescription is filled and some pharmacies may offer cheaper prices.  The website www.goodrx.com contains coupons for medications through different pharmacies. The prices here do not account for what the cost may be with help from insurance (it may be cheaper with your insurance), but the website can   give you the price if you did not use any insurance.  - You can print the associated coupon and take it with your prescription to the pharmacy.  - You may also stop by our office during regular business hours and pick up a GoodRx coupon card.  - If you need your prescription sent electronically to a different pharmacy, notify our office through [facility] MyChart or by phone at [phone] option 4.     Si Usted Necesita Algo Despus de Su Visita  Tambin puede  enviarnos un mensaje a travs de MyChart. Por lo general respondemos a los mensajes de MyChart en el transcurso de 1 a 2 das hbiles.  Para renovar recetas, por favor pida a su farmacia que se ponga en contacto con nuestra oficina. Nuestro nmero de fax es el [phone].  Si tiene un asunto urgente cuando la clnica est cerrada y que no puede esperar hasta el siguiente da hbil, puede llamar/localizar a su doctor(a) al nmero que aparece a continuacin.   Por favor, tenga en cuenta que aunque hacemos todo lo posible para estar disponibles para asuntos urgentes fuera del horario de oficina, no estamos disponibles las 24 horas del da, los 7 das de la semana.   Si tiene un problema urgente y no puede comunicarse con nosotros, puede optar por buscar atencin mdica  en el consultorio de su doctor(a), en una clnica privada, en un centro de atencin urgente o en una sala de emergencias.  Si tiene una emergencia mdica, por favor llame inmediatamente al 911 o vaya a la sala de emergencias.  Nmeros de bper  - Dr. Kowalski: [phone]  - Dra. Moye: [phone]  - Dra. Stewart: [phone]  En caso de inclemencias del tiempo, por favor llame a nuestra lnea principal al [phone] para una actualizacin sobre el estado de cualquier retraso o cierre.  Consejos para la medicacin en dermatologa: Por favor, guarde las cajas en las que vienen los medicamentos de uso tpico para ayudarle a seguir las instrucciones sobre dnde y cmo usarlos. Las farmacias generalmente imprimen las instrucciones del medicamento slo en las cajas y no directamente en los tubos del medicamento.   Si su medicamento es muy caro, por favor, pngase en contacto con nuestra oficina llamando al [phone] y presione la opcin 4 o envenos un mensaje a travs de MyChart.   No podemos decirle cul ser su copago por los medicamentos por adelantado ya que esto es diferente dependiendo de la cobertura de su seguro.  Sin embargo, es posible que podamos encontrar un medicamento sustituto a menor costo o llenar un formulario para que el seguro cubra el medicamento que se considera necesario.   Si se requiere una autorizacin previa para que su compaa de seguros cubra su medicamento, por favor permtanos de 1 a 2 das hbiles para completar este proceso.  Los precios de los medicamentos varan con frecuencia dependiendo del lugar de dnde se surte la receta y alguna farmacias pueden ofrecer precios ms baratos.  El sitio web www.goodrx.com tiene cupones para medicamentos de diferentes farmacias. Los precios aqu no tienen en cuenta lo que podra costar con la ayuda del seguro (puede ser ms barato con su seguro), pero el sitio web puede darle el precio si no utiliz ningn seguro.  - Puede imprimir el cupn correspondiente y llevarlo con su receta a la farmacia.  - Tambin puede pasar por nuestra oficina durante el horario de atencin regular y recoger una tarjeta de cupones de GoodRx.  -   Si necesita que su receta se enve electrnicamente a una farmacia diferente, informe a nuestra oficina a travs de MyChart de [facility] o por telfono llamando al [phone] y presione la opcin 4.  

## 2022-10-23 ENCOUNTER — Encounter: Payer: Self-pay | Admitting: Dermatology

## 2022-10-23 ENCOUNTER — Encounter: Payer: Self-pay | Admitting: Physician Assistant

## 2022-11-04 ENCOUNTER — Other Ambulatory Visit: Payer: Self-pay

## 2022-11-04 ENCOUNTER — Encounter: Payer: Self-pay | Admitting: Emergency Medicine

## 2022-11-04 ENCOUNTER — Emergency Department
Admission: EM | Admit: 2022-11-04 | Discharge: 2022-11-05 | Disposition: A | Discharge status: Home / Self Care | Payer: Medicare Other | Attending: Emergency Medicine | Admitting: Emergency Medicine

## 2022-11-04 ENCOUNTER — Emergency Department: Payer: Medicare Other

## 2022-11-04 DIAGNOSIS — G4489 Other headache syndrome: Secondary | ICD-10-CM | POA: Diagnosis not present

## 2022-11-04 DIAGNOSIS — K30 Functional dyspepsia: Secondary | ICD-10-CM | POA: Insufficient documentation

## 2022-11-04 DIAGNOSIS — I447 Left bundle-branch block, unspecified: Secondary | ICD-10-CM | POA: Diagnosis not present

## 2022-11-04 DIAGNOSIS — I10 Essential (primary) hypertension: Secondary | ICD-10-CM | POA: Insufficient documentation

## 2022-11-04 DIAGNOSIS — H538 Other visual disturbances: Secondary | ICD-10-CM | POA: Diagnosis not present

## 2022-11-04 DIAGNOSIS — K449 Diaphragmatic hernia without obstruction or gangrene: Secondary | ICD-10-CM | POA: Diagnosis not present

## 2022-11-04 DIAGNOSIS — R531 Weakness: Secondary | ICD-10-CM | POA: Diagnosis not present

## 2022-11-04 DIAGNOSIS — R42 Dizziness and giddiness: Secondary | ICD-10-CM | POA: Diagnosis not present

## 2022-11-04 DIAGNOSIS — I700 Atherosclerosis of aorta: Secondary | ICD-10-CM | POA: Diagnosis not present

## 2022-11-04 LAB — TROPONIN I (HIGH SENSITIVITY)
Troponin I (High Sensitivity): 42 ng/L — ABNORMAL HIGH (ref <18)
Troponin I (High Sensitivity): 44 ng/L — ABNORMAL HIGH (ref <18)

## 2022-11-04 LAB — BASIC METABOLIC PANEL
Anion gap: 8 (ref 5–15)
BUN: 16 mg/dL (ref 8–23)
CO2: 26 mmol/L (ref 22–32)
Calcium: 9.2 mg/dL (ref 8.9–10.3)
Chloride: 104 mmol/L (ref 98–111)
Creatinine, Ser: 0.85 mg/dL (ref 0.44–1.00)
GFR, Estimated: >60 mL/min (ref >60)
Glucose, Bld: 102 mg/dL — ABNORMAL HIGH (ref 70–99)
Potassium: 3.7 mmol/L (ref 3.5–5.1)
Sodium: 138 mmol/L (ref 135–145)

## 2022-11-04 LAB — CBC
HCT: 39.3 % (ref 36.0–46.0)
Hemoglobin: 12.3 g/dL (ref 12.0–15.0)
MCH: 27.2 pg (ref 26.0–34.0)
MCHC: 31.3 g/dL (ref 30.0–36.0)
MCV: 86.9 fL (ref 80.0–100.0)
Platelets: 156 10*3/uL (ref 150–400)
RBC: 4.52 MIL/uL (ref 3.87–5.11)
RDW: 14.8 % (ref 11.5–15.5)
WBC: 8 10*3/uL (ref 4.0–10.5)
nRBC: 0 % (ref 0.0–0.2)

## 2022-11-04 MED ORDER — SUCRALFATE 1 G PO TABS: 1.0000 g | ORAL_TABLET | Freq: Four times a day (QID) | ORAL | 1 refills | Status: DC

## 2022-11-04 MED ORDER — LIDOCAINE VISCOUS HCL 2 % MT SOLN
15.0000 mL | Freq: Once | OROMUCOSAL | Status: AC
  Administered 2022-11-04: 15 mL via ORAL
  Filled 2022-11-04: qty 15

## 2022-11-04 MED ORDER — ALUM & MAG HYDROXIDE-SIMETH 200-200-20 MG/5ML PO SUSP
30.0000 mL | Freq: Once | ORAL | Status: AC
  Administered 2022-11-04: 30 mL via ORAL
  Filled 2022-11-04: qty 30

## 2022-11-04 NOTE — ED Triage Notes (Signed)
Pt arrived via [facility] from home with reports of feeling dizzy, blurry vision, and elevated BP this afternoon.  Pt received 10mg  IV labetalol with EMS.  Pt reports also having indigestion that started about 2-3 hours ago.    Pt states she ate ham sandwich for lunch.  Pt states she has had hx of the same sxs, has been on BP meds before and was recently seen for SVT.

## 2022-11-04 NOTE — ED Provider Notes (Signed)
[facility] Provider Note    Event Date/Time   First MD Initiated Contact with Patient 11/04/22 2059     (approximate)   History   Hypertension, Dizziness, and Blurred Vision   HPI {Remember to add pertinent medical, surgical, social, and/or OB history to HPI:1} Kathryn Wise is a 89 y.o. female  ***       Physical Exam   Triage Vital Signs: ED Triage Vitals  Enc Vitals Group     BP 11/04/22 2030 (!) 219/95     Pulse Rate 11/04/22 2030 68     Resp 11/04/22 2030 20     Temp 11/04/22 2030 98.3 F (36.8 C)     Temp Source 11/04/22 2030 Oral     SpO2 11/04/22 2030 98 %     Weight 11/04/22 2029 150 lb (68 kg)     Height 11/04/22 2029 5\' 5"  (1.651 m)     Head Circumference --      Peak Flow --      Pain Score 11/04/22 2028 0     Pain Loc --      Pain Edu? --      Excl. in GC? --     Most recent vital signs: Vitals:   11/04/22 2030  BP: (!) 219/95  Pulse: 68  Resp: 20  Temp: 98.3 F (36.8 C)  SpO2: 98%    {Only need to document appropriate and relevant physical exam:1} General: Awake, no distress. *** CV:  Good peripheral perfusion. *** Resp:  Normal effort. *** Abd:  No distention. *** Other:  ***   ED Results / Procedures / Treatments   Labs (all labs ordered are listed, but only abnormal results are displayed) Labs Reviewed  BASIC METABOLIC PANEL - Abnormal; Notable for the following components:      Result Value   Glucose, Bld 102 (*)    All other components within normal limits  TROPONIN I (HIGH SENSITIVITY) - Abnormal; Notable for the following components:   Troponin I (High Sensitivity) 42 (*)    All other components within normal limits  CBC     EKG  I, Phineas Semen, attending physician, personally viewed and interpreted this EKG  EKG Time: 2032 Rate: 63 Rhythm: sinus rhythm Axis: normal Intervals: qtc 479 QRS: IVCD ST changes: no st elevation Impression: abnormal ekg  RADIOLOGY I independently  interpreted and visualized the CXR. My interpretation: No pneumonia Radiology interpretation:  IMPRESSION:  Stable exam without acute or active cardiopulmonary disease.      PROCEDURES:  Critical Care performed: Yes  CRITICAL CARE Performed by: Phineas Semen   Total critical care time: *** minutes  Critical care time was exclusive of separately billable procedures and treating other patients.  Critical care was necessary to treat or prevent imminent or life-threatening deterioration.  Critical care was time spent personally by me on the following activities: development of treatment plan with patient and/or surrogate as well as nursing, discussions with consultants, evaluation of patient's response to treatment, examination of patient, obtaining history from patient or surrogate, ordering and performing treatments and interventions, ordering and review of laboratory studies, ordering and review of radiographic studies, pulse oximetry and re-evaluation of patient's condition.   Procedures    MEDICATIONS ORDERED IN ED: Medications - No data to display   IMPRESSION / MDM / ASSESSMENT AND PLAN / ED COURSE  I reviewed the triage vital signs and the nursing notes.  Differential diagnosis includes, but is not limited to, ***  Patient's presentation is most consistent with {EM COPA:27473}   ***The patient is on the cardiac monitor to evaluate for evidence of arrhythmia and/or significant heart rate changes.  ***      FINAL CLINICAL IMPRESSION(S) / ED DIAGNOSES   Final diagnoses:  None     Rx / DC Orders   ED Discharge Orders     None        Note:  This document was prepared using Dragon voice recognition software and may include unintentional dictation errors.

## 2022-11-04 NOTE — Discharge Instructions (Signed)
Please seek medical attention for any high fevers, chest pain, shortness of breath, change in behavior, persistent vomiting, bloody stool or any other new or concerning symptoms.  

## 2022-11-05 DIAGNOSIS — I10 Essential (primary) hypertension: Secondary | ICD-10-CM | POA: Diagnosis not present

## 2022-11-05 MED ORDER — HYDRALAZINE HCL 20 MG/ML IJ SOLN
5.0000 mg | Freq: Once | INTRAMUSCULAR | Status: AC
  Administered 2022-11-05: 5 mg via INTRAVENOUS
  Filled 2022-11-05: qty 1

## 2022-11-08 ENCOUNTER — Telehealth: Payer: Self-pay

## 2022-11-08 NOTE — Telephone Encounter (Signed)
     Patient  visit on 11/05/2022  at [facility] was for hypertension, dizziness and blurred vision.  Have you been able to follow up with your primary care physician? Yes  The patient was or was not able to obtain any needed medicine or equipment. Patient was able to obtain medication.  Are there diet recommendations that you are having difficulty following? No  Patient expresses understanding of discharge instructions and education provided has no other needs at this time. Yes   Kathryn Wise [facility]  [facility] Community Resource Care Guide   [name]  ?? [phone]   Website: [email or web address]  [email or web address]

## 2022-11-13 DIAGNOSIS — K219 Gastro-esophageal reflux disease without esophagitis: Secondary | ICD-10-CM | POA: Diagnosis not present

## 2022-11-13 DIAGNOSIS — I10 Essential (primary) hypertension: Secondary | ICD-10-CM | POA: Diagnosis not present

## 2022-11-13 DIAGNOSIS — R140 Abdominal distension (gaseous): Secondary | ICD-10-CM | POA: Diagnosis not present

## 2022-11-13 DIAGNOSIS — K5900 Constipation, unspecified: Secondary | ICD-10-CM | POA: Diagnosis not present

## 2022-11-21 ENCOUNTER — Ambulatory Visit: Payer: Medicare Other | Admitting: Physician Assistant

## 2022-11-22 ENCOUNTER — Ambulatory Visit: Payer: Medicare Other | Admitting: Internal Medicine

## 2022-11-23 IMAGING — CT CT ANGIO NECK
3 of 7 series · 8 of 33 positions shown · IV contrast (omnipaque)
Comparison: Carotid ultrasound September 08, 2020.

CLINICAL DATA: Episode of vision loss. Abnormal carotid ultrasound.

EXAM:
CT ANGIOGRAPHY NECK
TECHNIQUE: Multidetector CT imaging of the neck was performed using the
standard protocol during bolus administration of intravenous
contrast. Multiplanar CT image reconstructions and MIPs were
obtained to evaluate the vascular anatomy. Carotid stenosis
measurements (when applicable) are obtained utilizing NASCET
criteria, using the distal internal carotid diameter as the
denominator.
CONTRAST:  60mL OMNIPAQUE IOHEXOL 350 MG/ML SOLN

[Series 5: cta neck cta neck (person_name) 2.00 · axial · 0.42mm/px · z∈[-720,-636]mm · 2 of 126 slices shown]
[im 42/126  soft-tissue]
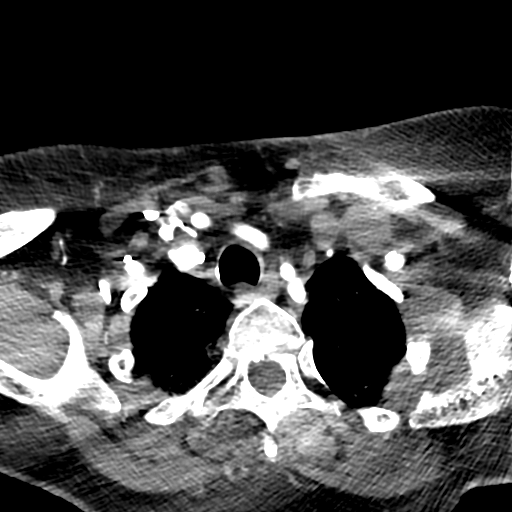
[im 84/126  soft-tissue]
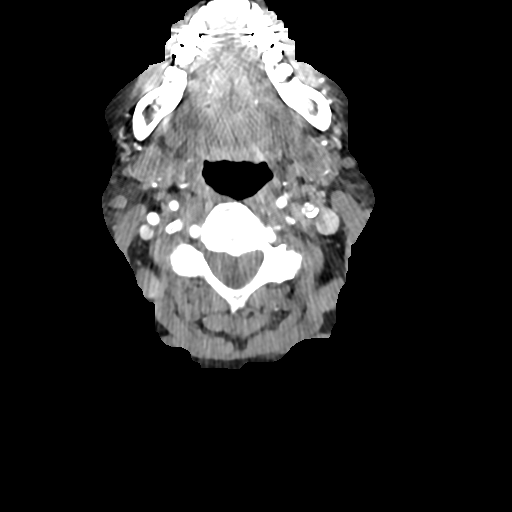

[Series 6: ax thin mips cta neck 1.00 ax · axial · 0.42mm/px · z∈[-761,-593]mm · 5 of 252 slices shown]
[im 42/252  soft-tissue]
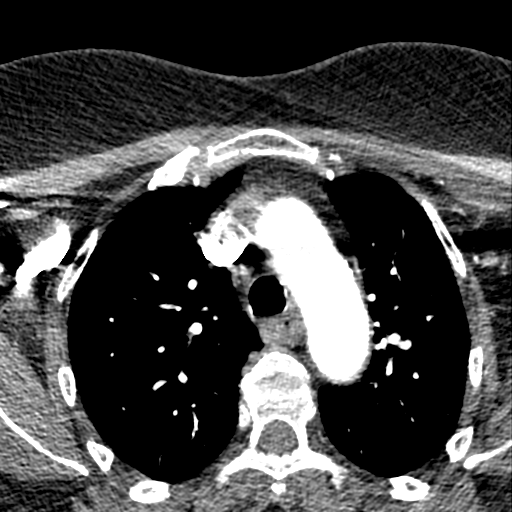
[im 84/252  bone]
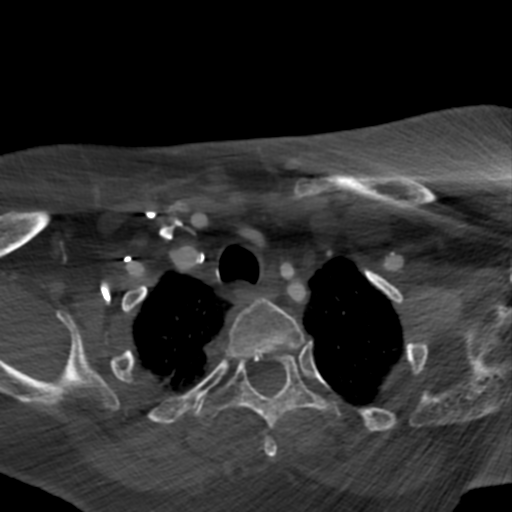
[im 126/252  soft-tissue]
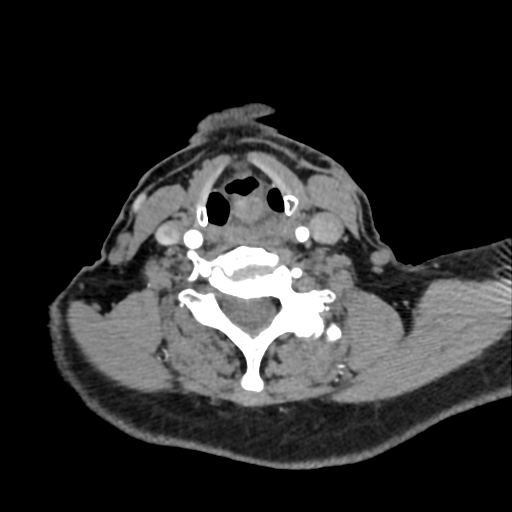
[im 168/252  bone]
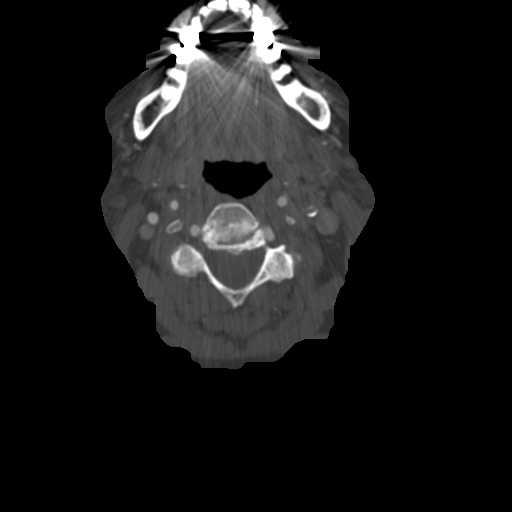
[im 210/252  soft-tissue]
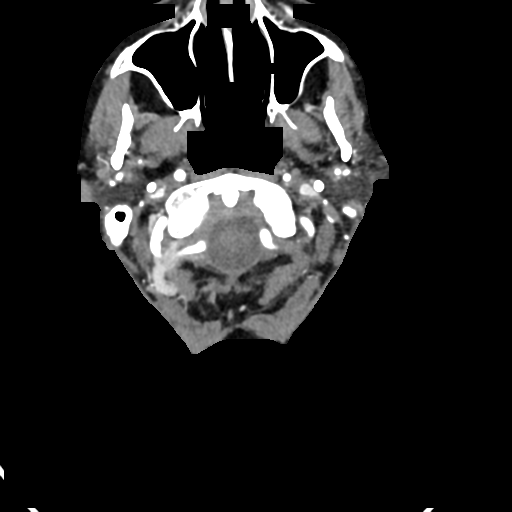

[Series 10: sag thin mips cta neck 1.00 sag · sagittal · 0.42mm/px · 1 of 215 slices shown]
[im 108/215  soft-tissue]
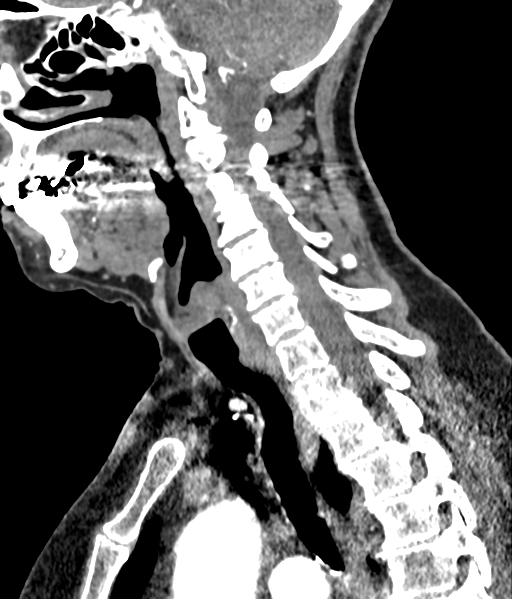

[8 of 33 positions shown; findings below may reference images not displayed]

FINDINGS: Aortic arch: Calcific atherosclerosis of the aorta and its branch
vessels. Mildly enlarged main pulmonary artery.

Right carotid system: Predominately calcific atherosclerosis at the
carotid bifurcation with approximately 50% stenosis of the proximal
right ICA.

Left carotid system: Mixed calcific and noncalcific atherosclerosis
at the carotid bifurcation with critical/near occlusive stenosis of
the proximal left ICA (series 6, image 171; series 8, image 88) with
the lumen measuring approximately 0.6 mm in diameter. Using the
NASCET criteria this correlates with an approximately 80% stenosis,
but this may be underestimated given the distal left ICA is
diminutive in the neck, possibly related to poor flow. z

Vertebral arteries: Bilateral vertebral artery origin stenosis with
moderate stenosis of the proximal left vertebral artery. Otherwise,
scattered atherosclerosis without evidence of significant stenosis.

Skeleton: No acute abnormality

Other neck: No mass or suspicious adenopathy.

Upper chest: Visualized lung apices are clear.
IMPRESSION: 1. Critical/near occlusive stenosis of the left proximal internal
carotid artery with the lumen measuring approximately 0.6 mm in
diameter. Using the NASCET criteria this correlates with an
approximately 80% stenosis, but this may be underestimated given the
distal left ICA is diminutive in the neck, possibly in part
secondary to poor flow.
2. Approximately 50% stenosis of the proximal right ICA bifurcation.
3. Moderate stenosis of the proximal left vertebral artery.
4. Mildly enlarged main pulmonary artery, which can be seen with
pulmonary arterial hypertension.

These results will be called to the ordering clinician or
representative by the Radiologist Assistant, and communication
documented in the PACS or [REDACTED].

## 2022-11-29 DIAGNOSIS — R030 Elevated blood-pressure reading, without diagnosis of hypertension: Secondary | ICD-10-CM | POA: Diagnosis not present

## 2022-11-29 DIAGNOSIS — R1314 Dysphagia, pharyngoesophageal phase: Secondary | ICD-10-CM | POA: Diagnosis not present

## 2022-11-29 DIAGNOSIS — I479 Paroxysmal tachycardia, unspecified: Secondary | ICD-10-CM | POA: Diagnosis not present

## 2022-11-29 DIAGNOSIS — I150 Renovascular hypertension: Secondary | ICD-10-CM | POA: Diagnosis not present

## 2022-11-29 DIAGNOSIS — K219 Gastro-esophageal reflux disease without esophagitis: Secondary | ICD-10-CM | POA: Diagnosis not present

## 2022-11-29 DIAGNOSIS — R630 Anorexia: Secondary | ICD-10-CM | POA: Diagnosis not present

## 2022-11-30 ENCOUNTER — Encounter: Payer: Self-pay | Admitting: Physician Assistant

## 2022-11-30 ENCOUNTER — Ambulatory Visit: Payer: Medicare Other | Attending: Physician Assistant | Admitting: Physician Assistant

## 2022-11-30 VITALS — BP 176/72 | HR 54 | Ht 65.0 in | Wt 149.8 lb

## 2022-11-30 DIAGNOSIS — R55 Syncope and collapse: Secondary | ICD-10-CM | POA: Insufficient documentation

## 2022-11-30 DIAGNOSIS — I421 Obstructive hypertrophic cardiomyopathy: Secondary | ICD-10-CM | POA: Insufficient documentation

## 2022-11-30 DIAGNOSIS — I701 Atherosclerosis of renal artery: Secondary | ICD-10-CM | POA: Insufficient documentation

## 2022-11-30 DIAGNOSIS — I447 Left bundle-branch block, unspecified: Secondary | ICD-10-CM | POA: Diagnosis not present

## 2022-11-30 DIAGNOSIS — Z8679 Personal history of other diseases of the circulatory system: Secondary | ICD-10-CM | POA: Diagnosis not present

## 2022-11-30 DIAGNOSIS — I4719 Other supraventricular tachycardia: Secondary | ICD-10-CM | POA: Insufficient documentation

## 2022-11-30 DIAGNOSIS — I059 Rheumatic mitral valve disease, unspecified: Secondary | ICD-10-CM | POA: Insufficient documentation

## 2022-11-30 DIAGNOSIS — R0989 Other specified symptoms and signs involving the circulatory and respiratory systems: Secondary | ICD-10-CM | POA: Insufficient documentation

## 2022-11-30 MED ORDER — AMLODIPINE BESYLATE 5 MG PO TABS: 5.0000 mg | ORAL_TABLET | ORAL | 3 refills | Status: AC | PRN

## 2022-11-30 NOTE — Progress Notes (Signed)
Cardiology Office Note    Date:  11/30/2022   ID:  Kathryn Wise, DOB February 09, 1930, MRN [id number]  PCP:  Enid Baas, MD  Cardiologist:  Yvonne Kendall, MD  Electrophysiologist:  None   Chief Complaint: Follow-up  History of Present Illness:   Kathryn Wise is a 89 y.o. female with history of nonobstructive CAD, HOCM, HFpEF, difficult to control hypertension with left renal artery stenosis status post stenting in 12/2018, carotid artery disease status post left sided stenting in 09/2020, possible small SDH noted in 10/2021, LBBB, HLD, and mitral regurgitation who presents for ED follow-up.    Kathryn Wise was previously followed by Dr. Alvino Chapel in 2017 for cardiac murmur with echo at that time demonstrating an EF of 60 to 65%, normal wall motion, mild focal basal and mild concentric LVH of the septum with mild LVOT gradient, grade 1 diastolic dysfunction, mildly dilated aortic root and ascending aorta, mild mitral regurgitation, normal RV systolic function and PASP.  She was admitted for chest pain in 05/2017 with Lexiscan being abnormal.  She subsequently underwent LHC in 05/2017, which demonstrated nonobstructive CAD involving the LCx and RCA with medical management recommended.  Echo in 05/2017 showed an EF of 55 to 60%, normal wall motion, mild focal basal and mild concentric LVH, near cavity obliteration in systole, grade 1 diastolic dysfunction, very mild aortic stenosis, moderate mitral regurgitation, mildly dilated left atrium, normal RV systolic function, and a PASP of 50 mmHg.  Following this, she was lost to follow-up.   She was admitted to the hospital in 09/2020 with pneumonia and HFpEF.  Echo demonstrated an EF of 55 to 60%, moderate LVH, grade 2 diastolic dysfunction with moderate LVOT obstruction with a peak gradient of 68 mmHg at rest and 74 mmHg with Valsalva, normal RV systolic function and ventricular cavity size, moderately elevated PASP estimated at 46.5 mmHg, moderately dilated  left atrium, mild mitral regurgitation, mild mitral stenosis, moderate mitral annular calcification, and mild to moderate aortic valve sclerosis without evidence of stenosis.  High-sensitivity troponin trended to 976.  She was lost to follow-up.   She was admitted in 09/2021 with hypertensive urgency with noted improvement in BP following 10 mg of amlodipine and 5 mg of IV labetalol.  She was noted to have a mildly elevated high-sensitivity troponin peaking at 36 felt to be related to supply demand ischemia from hypertension.   She was admitted to the hospital in 10/2021 noting left upper extremity numbness that was transient, bilateral lower extremity swelling, and generalized weakness.  She was consulted on by neurosurgery for possible TIA.  MRI of the brain showed a possible 2 mm subdural hematoma overlying the right cerebral convexity.  This was not well visualized on CT imaging.  She was unaware of any fall/trauma.  There was recommended the patient continue aspirin without need for antiepileptic medication given lack of subdural hematoma noted on CT imaging.  Echo, as read by outside group, showed an EF of 50 to 55%, no regional wall motion normalities, mild concentric LVH, grade 1 diastolic dysfunction, normal RV systolic function and ventricular cavity size, myxomatous mitral valve with mild stenosis, and mild aortic insufficiency.  Lower extremity ultrasound was negative for DVT bilaterally.  High-sensitivity troponin 28 with a delta troponin of 29.  BNP 418.   She was seen in hospital follow-up on 11/20/2021 and was without symptoms of angina or decompensation at that time.  She did report an approximate 4-week history of left jaw pain,  and in the setting had multiple teeth extracted along that side without symptomatic improvement.  This discomfort was exacerbated by exertion, chewing, and yawning.  Subsequent Lexiscan MPI on 11/27/2021 was overall low risk without evidence of infarction or ischemia.  CT  attenuated corrected images showed mild coronary artery calcification and aortic atherosclerosis.  Echo on 12/07/2021 demonstrated an EF of 60 to 65%, no regional wall motion abnormalities, moderate LVH with severe basal septal hypertrophy with an LVOT gradient of 39 mmHg at rest and 52 mmHg with Valsalva with one measurement of 127 mmHg, normal RV systolic function and ventricular cavity size, mildly dilated left atrium, moderate mitral valve regurgitation with severe mitral annular calcification, and aortic valve sclerosis without evidence of stenosis.   She was seen in the office on 12/28/2021 and was without symptoms of angina or decompensation.  She did feel somewhat deconditioned as she had not been able to exercise regularly.  She was without symptoms of dizziness, presyncope, or syncope.  Her blood pressure was improved following the addition of Cardizem CD.  We subsequently titrated this to 240 mg daily.  She subsequently reported issues with pain all over, noting that slight "changes" led to a flareup of polymyalgia.  Given this, it was recommended she resume lower dose Cardizem 180 mg daily.  She subsequently discontinued this medication altogether indicating she was "feeling poorly."  She was later diagnosed with a sinus infection.  We recommended she resume Cardizem 180 mg daily due to elevated BP.   She was seen in the office in 01/2022 noting diffuse myalgias and limited mobility while on diltiazem.  She underwent intermittent washing out of this medication with improvement in symptoms while she was off diltiazem.  However, she did continue to take diltiazem with intermittent elevated blood pressure readings.  She requested a prednisone taper for her myalgias.  We transitioned her to low-dose carvedilol 3.125 mg twice daily.  She subsequently notified our office in 02/2022 that carvedilol was working well for her blood pressure, though causing fatigue and weakness leading it to be discontinued.  She  was placed back on amlodipine 10 mg daily, which she previously tolerated without issues.     She was seen in the office in 03/2022 noting an increase in bilateral lower extremity swelling that began after reinitiating amlodipine.  She noticed an improvement in her energy following discontinuation of carvedilol.  She preferred to remain on amlodipine 10 mg daily.  She was started on furosemide 20 mg daily with follow-up labs showing stable renal function.   She was seen by her PCP 08/17/2022 for elevated blood pressure and placed on hydralazine.  While at the hairdresser, later that day, while sitting on the hair dryer, she developed sudden onset of tachypalpitations with associated dizziness and near syncope.  No frank syncope.  Symptoms lasted a few seconds and spontaneously resolved.  She drove herself home.  While at home, she had another episode of tachypalpitations associated near syncope that persisted a little longer prompting her to contact EMS.  She never had chest pain or frank syncope.  She was admitted to [facility] from 1/19 through 08/18/2022 with atrial tachycardia and near syncope in the setting of HOCM.  High-sensitivity troponin peaked at 305 and was felt to represent demand ischemia in the setting of atrial tachycardia with known HOCM.  BNP 710.  With initiation of bisoprolol there was documented improvement in tachy-palpitation burden.  Patient discharged on bisoprolol 5 mg daily.  She was last seen in  the office in 07/2022 and was without symptoms of angina, cardiac decompensation, dizziness, presyncope, or syncope.  She had tolerated the addition of bisoprolol without issue.  She subsequently contacted our office concerned about asymptomatic heart rates in the 50s bpm.  In this setting, bisoprolol was reduced to 2.5 mg daily.  She was evaluated in the ED on 11/04/2022 with elevated BP readings with a BP of 219/95.  High-sensitivity troponin mildly elevated and flat trending peaking at 44.  She was  given 5 mg of IV hydralazine and advised to follow-up as outpatient.  She comes in doing well from a cardiac perspective and is without symptoms of angina or cardiac decompensation.  No palpitations, dizziness, presyncope, or syncope.  She continues to note some fluctuations in her blood pressure, ranging from the 120s to 190s systolic.  She does not believe as needed hydralazine has helped her much and has at times resumed as needed amlodipine 5 mg.  She would prefer to transition back to amlodipine on an as needed basis.  She is currently taking bisoprolol 2.5 mg every other day, not daily.  She remains active, regularly swimming.   Labs independently reviewed: 10/2022 - Hgb 12.3, PLT 156, potassium 3.7, BUN 16, serum creatinine 0.85 07/2022 - magnesium 1.9, TSH normal 10/2021 - TC 115, TG 91, HDL 42, LDL 55, albumin 4.1, AST/ALT normal 08/2021 - A1c 6.4  Past Medical History:  Diagnosis Date   Arthritis    Collagen vascular disease (HCC)    Dysphagia, pharyngoesophageal phase 09/10/2013   Upper GI study with barium swallow was done at  [facility] Mar 2015   No reflux seen  Small irreducible hiatal hernia  Mild changes of presbyeophagus (abnormal contractions of the esophagus that occur with aging) No strictures Normal gastric emptying  Incomplete visualization of stomach fold due to patient's inability turn     GERD (gastroesophageal reflux disease)    Heart murmur    has had years and years   History of shingles Dec 2013   treated with steroids , post op from shoulder surgery   History of squamous cell carcinoma 05/02/2016   right mid lateral pretibial   Hypertension    Hypothyroidism    Major depressive disorder, single episode 11/05/2015   Osteoarthritis of left shoulder 07/14/2012   Pneumonia 10/21/2020   Squamous cell carcinoma of skin 05/12/2016   R mid lat pretibial - other skin cancers treated by Dr. Jarold Motto   Squamous cell carcinoma of skin 08/22/2020   left distal medial popliteal -  EDC    Past Surgical History:  Procedure Laterality Date   ARTHOSCOPIC ROTAOR CUFF REPAIR     LEFT  10+  YEARS     CAROTID PTA/STENT INTERVENTION Left 10/19/2020   Procedure: CAROTID PTA/STENT INTERVENTION;  Surgeon: Renford Dills, MD;  Location: [facility];  Service: Cardiovascular;  Laterality: Left;   CATARACT EXTRACTION W/ INTRAOCULAR LENS IMPLANT     RIGHT EYE   CATARACT EXTRACTION W/PHACO Left 03/28/2016   Procedure: CATARACT EXTRACTION PHACO AND INTRAOCULAR LENS PLACEMENT (IOC);  Surgeon: Lockie Mola, MD;  Location: [facility];  Service: Ophthalmology;  Laterality: Left;  TORIC   FACELIFT     FOOT SURGERY     rt foot   TUMOR REMOVED   JOINT REPLACEMENT Left Dec 2013   shoulder   LEFT HEART CATH AND CORONARY ANGIOGRAPHY N/A 06/12/2017   Procedure: LEFT HEART CATH AND CORONARY ANGIOGRAPHY;  Surgeon: Antonieta Iba, MD;  Location:  [facility];  Service: Cardiovascular;  Laterality: N/A;   RENAL ANGIOGRAPHY Left 01/07/2019   Procedure: RENAL ANGIOGRAPHY;  Surgeon: Renford Dills, MD;  Location: [facility];  Service: Cardiovascular;  Laterality: Left;   RENAL ARTERY STENT     SKIN CANCER EXCISION     TOTAL SHOULDER ARTHROPLASTY  07/14/2012   Procedure: TOTAL SHOULDER ARTHROPLASTY;  Surgeon: Eulas Post, MD;  Location: [facility] OR;  Service: Orthopedics;  Laterality: Left;    Current Medications: Current Meds  Medication Sig   amLODipine (NORVASC) 5 MG tablet Take 1 tablet (5 mg total) by mouth as needed (Once daily AS NEEDED for SBP (top blood pressure number) is greater than 160.).   aspirin EC 81 MG tablet Take 81 mg by mouth daily.   bisoprolol (ZEBETA) 5 MG tablet Take 0.5 tablet (2.5 mg) by mouth once daily   Cholecalciferol (VITAMIN D-3) 1000 units CAPS Take 1,000 Units by mouth daily.   cyanocobalamin (VITAMIN B12) 1000 MCG tablet Take 1,000 mcg by mouth daily.   levothyroxine (SYNTHROID) 75 MCG tablet Take 75 mcg by  mouth daily before breakfast.   nitroGLYCERIN (NITROSTAT) 0.4 MG SL tablet Place 1 tablet (0.4 mg total) under the tongue every 5 (five) minutes as needed for chest pain.   omeprazole (PRILOSEC) 20 MG capsule TAKE 1 CAPSULE(20 MG) BY MOUTH EVERY MORNING AS NEEDED FOR HEARTBURN   rosuvastatin (CRESTOR) 10 MG tablet Take 1 tablet (10 mg total) by mouth at bedtime.   sucralfate (CARAFATE) 1 g tablet Take 1 tablet (1 g total) by mouth 4 (four) times daily.   temazepam (RESTORIL) 15 MG capsule TAKE 1 CAPSULE BY MOUTH AT BEDTIME AS NEEDED SLEEP   Turmeric 500 MG CAPS Take 500 mg by mouth daily.   [DISCONTINUED] hydrALAZINE (APRESOLINE) 25 MG tablet Take 1 tablet (25 mg total) by mouth every 6 (six) hours as needed (pressure >160).    Allergies:   Alendronate, Codeine, Lisinopril, Metoprolol, Oxycodone, and Oxycontin [oxycodone hcl]   Social History   Socioeconomic History   Marital status: Widowed    Spouse name: Not on file   Number of children: Not on file   Years of education: Not on file   Highest education level: Not on file  Occupational History   Not on file  Tobacco Use   Smoking status: Former    Types: Cigarettes    Quit date: 07/30/1972    Years since quitting: 50.3   Smokeless tobacco: Never   Tobacco comments:    smoked for only a few months  Vaping Use   Vaping Use: Never used  Substance and Sexual Activity   Alcohol use: Yes    Comment: OCCAS WINE    Drug use: No   Sexual activity: Never  Other Topics Concern   Not on file  Social History Narrative   Not on file   Social Determinants of Health   Financial Resource Strain: Low Risk  (04/20/2020)   Overall Financial Resource Strain (CARDIA)    Difficulty of Paying Living Expenses: Not hard at all  Food Insecurity: No Food Insecurity (04/20/2020)   Hunger Vital Sign    Worried About Running Out of Food in the Last Year: Never true    Ran Out of Food in the Last Year: Never true  Transportation Needs: No  Transportation Needs (04/20/2020)   PRAPARE - Administrator, Civil Service (Medical): No    Lack of Transportation (Non-Medical): No  Physical Activity: Sufficiently Active (04/20/2020)   Exercise Vital Sign    Days of Exercise per Week: 4 days    Minutes of Exercise per Session: 60 min  Stress: No Stress Concern Present (04/20/2020)   [employer] of Occupational Health - Occupational Stress Questionnaire    Feeling of Stress : Not at all  Social Connections: Unknown (04/20/2020)   Social Connection and Isolation Panel [NHANES]    Frequency of Communication with Friends and Family: More than three times a week    Frequency of Social Gatherings with Friends and Family: Not on file    Attends Religious Services: Not on file    Active Member of Clubs or Organizations: Not on file    Attends Banker Meetings: Not on file    Marital Status: Not on file     Family History:  The patient's family history includes Breast cancer (age of onset: 42) in her daughter; Cancer in her daughter and son.  ROS:   12-point review of systems is negative unless otherwise noted in the HPI.   EKGs/Labs/Other Studies Reviewed:    Studies reviewed were summarized above. The additional studies were reviewed today:  Limited echo 12/07/2021: 1. Left ventricular ejection fraction, by estimation, is 60 to 65%. The  left ventricle has normal function. The left ventricle has no regional  wall motion abnormalities. There is moderat   2. There is moderate left ventricular hypertrophy with severe basal  septal hypertrophy. LVOT gradient noted, 39 mm Hg at rest, up to 52 mm Hg  with valsalva. One measurement estimating gradient at 127 mm Hg.   3. Right ventricular systolic function is normal. The right ventricular  size is normal. Tricuspid regurgitation signal is inadequate for assessing  PA pressure.   4. Left atrial size was mildly dilated.   5. The mitral valve is normal in  structure. Moderate mitral valve  regurgitation. Severe mitral annular calcification.   6. The aortic valve is normal in structure. Aortic valve regurgitation is  not visualized. Aortic valve sclerosis/calcification is present, without  any evidence of aortic stenosis.   Comparison(s): 11/13/21 echo measured LVOT Vmax as 156 cm/s. __________   Eugenie Birks MPI 11/27/2021:   The study is normal. The study is low risk.   No ST deviation was noted.   LV perfusion is normal. There is no evidence of ischemia. There is no evidence of infarction.   Left ventricular function is normal. End diastolic cavity size is normal. End systolic cavity size is normal.   Suboptimal study due to GI uptake.   CT attenuation images with mild aortic and coronary calcification. __________   2D echo 11/13/2021:  1. Left ventricular ejection fraction, by estimation, is 50 to 55%. The  left ventricle has low normal function. The left ventricle has no regional  wall motion abnormalities. There is mild concentric left ventricular  hypertrophy. Left ventricular  diastolic parameters are consistent with Grade I diastolic dysfunction  (impaired relaxation).   2. Right ventricular systolic function is normal. The right ventricular  size is normal.   3. The mitral valve is myxomatous. No evidence of mitral valve  regurgitation. Mild mitral stenosis.   4. The aortic valve is normal in structure. Aortic valve regurgitation is  mild. __________   2D echo 06/26/2017: - Left ventricle: The cavity size was normal. Near cavity    obliteration in systole. There was mild focal basal and mild    concentric hypertrophy of the septum. Systolic function  was    normal. The estimated ejection fraction was in the range of 55%    to 60%. Wall motion was normal; there were no regional wall    motion abnormalities. Doppler parameters are consistent with    abnormal left ventricular relaxation (grade 1 diastolic    dysfunction).  -  Aortic valve: Transvalvular velocity was increased. There was    very mild stenosis.  - Mitral valve: Calcified annulus. There was moderate    regurgitation.  - Left atrium: The atrium was mildly dilated.  - Right ventricle: Systolic function was normal.  - Pulmonary arteries: Systolic pressure was moderately elevated. PA    peak pressure: 50 mm Hg (S). __________  [facility] 06/12/2017: Coronary dominance: Right  Left mainstem:   Large vessel that bifurcates into the LAD and left circumflex, Ostial 30% disease, calcified.  Left anterior descending (LAD):   Large vessel that extends to the apical region, diagonal branch 2 of moderate size, mild lumenal irregularities  Left circumflex (LCx):  Large vessel with OM branch 2, moderate 50% mid vessel disease, calcified  Right coronary artery (RCA):  Right dominant vessel with PL and PDA, moderate focal disease, eccentric 50 to 60%  Left ventriculography: Left ventricular systolic function is normal/hyperdynamic, LVEF is estimated at 55-65%, there is no significant mitral regurgitation , no significant aortic valve stenosis  Final Conclusions:   Moderate mid LCX and RCA disease, Medical management recommended Normal/hyperdynamic function  Recommendations:  Follow up on echocardiogram Start metoprolol tartrate 25 mg po BID __________   Eugenie Birks MPI 06/11/2017: Abnormal, potentially high risk myocardial perfusion stress test. There is a moderate in size, mild in severity, reversible defect involving the mid inferolateral, apical lateral, and apical segments consistent with ischemia. There is a small in size, mild in severity, reversible defect involving the mid anterior segment, which may represent subtle ischemia and/or shifting breast attenuation. The left ventricular ejection fraction is normal (64%). Transient ischemic dilation is noted (TID 1.31), which is non-specific but can be seen balanced/multivessel ischemia. __________   2D  echo 12/05/2015: - Left ventricle: The cavity size was normal. There was mild focal    basal and mild concentric hypertrophy of the septum, with mild    LVOT gradient. Systolic function was normal. The estimated    ejection fraction was in the range of 60% to 65%. Wall motion was    normal; there were no regional wall motion abnormalities. Doppler    parameters are consistent with abnormal left ventricular    relaxation (grade 1 diastolic dysfunction).  - Aorta: Aortic root with milldy dilated, dimension: 35 mm (ED).    Ascending aorta was mildly dilated, diameter: 35 mm (S).  - Mitral valve: There was mild regurgitation.  - Left atrium: The atrium was normal in size.  - Right ventricle: Systolic function was normal.  - Pulmonary arteries: Systolic pressure was within the normal    range.  - Inferior vena cava: The vessel was normal in size. The    respirophasic diameter changes were in the normal range (>= 50%),    consistent with normal central venous pressure.   Impressions:   - Murmur likely secondary to aortic valve sclerosis without    significant stenosis, and mild LVOT gradient.   EKG:  EKG is ordered today.  The EKG ordered today demonstrates sinus bradycardia, 54 bpm, LBBB  Recent Labs: 08/17/2022: B Natriuretic Peptide 710.6; Magnesium 1.9; TSH 1.061 11/04/2022: BUN 16; Creatinine, Ser 0.85; Hemoglobin 12.3; Platelets 156; Potassium  3.7; Sodium 138  Recent Lipid Panel    Component Value Date/Time   CHOL 115 11/14/2021 0538   TRIG 91 11/14/2021 0538   HDL 42 11/14/2021 0538   CHOLHDL 2.7 11/14/2021 0538   VLDL 18 11/14/2021 0538   LDLCALC 55 11/14/2021 0538   LDLDIRECT 126.0 09/19/2016 1411    PHYSICAL EXAM:    VS:  BP (!) 176/72 (BP Location: Right Arm, Patient Position: Sitting, Cuff Size: Normal)   Pulse (!) 54   Ht 5\' 5"  (1.651 m)   Wt 149 lb 12.8 oz (67.9 kg)   SpO2 97%   BMI 24.93 kg/m   BMI: Body mass index is 24.93 kg/m.  Physical Exam Vitals  reviewed.  Constitutional:      Appearance: She is well-developed.  HENT:     Head: Normocephalic and atraumatic.  Eyes:     General:        Right eye: No discharge.        Left eye: No discharge.  Neck:     Vascular: No JVD.  Cardiovascular:     Rate and Rhythm: Regular rhythm. Bradycardia present.     Heart sounds: S1 normal and S2 normal. Heart sounds not distant. No midsystolic click and no opening snap. Murmur heard.     Systolic murmur is present with a grade of 2/6.     No friction rub.  Pulmonary:     Effort: Pulmonary effort is normal. No respiratory distress.     Breath sounds: Normal breath sounds. No decreased breath sounds, wheezing or rales.  Chest:     Chest wall: No tenderness.  Abdominal:     General: There is no distension.  Musculoskeletal:     Cervical back: Normal range of motion.     Right lower leg: No edema.     Left lower leg: No edema.  Skin:    General: Skin is warm and dry.     Nails: There is no clubbing.  Neurological:     Mental Status: She is alert and oriented to person, place, and time.  Psychiatric:        Speech: Speech normal.        Behavior: Behavior normal.        Thought Content: Thought content normal.        Judgment: Judgment normal.     Wt Readings from Last 3 Encounters:  11/30/22 149 lb 12.8 oz (67.9 kg)  11/04/22 150 lb (68 kg)  08/21/22 154 lb 8 oz (70.1 kg)     ASSESSMENT & PLAN:   Atrial tachycardia with near syncope: Quiescent following the addition of bisoprolol.  No symptoms concerning for dizziness, near-syncope, or syncope.  Zio patch unavailable for review.  She is currently taking bisoprolol 2.5 mg, every other day rather than daily and prefers to this regimen.  HOCM with LBBB: Previously, she has noted intolerance to carvedilol, metoprolol, and diltiazem secondary to myalgias and fatigue.  We have historically wanted to run her heart rates mildly bradycardic in the setting of underlying HOCM.  She remains on  bisoprolol 2.5 mg every other day and her preference.  She indicates first-degree relatives have previously been screened.  Given advanced age and comorbid conditions, she has preferred to follow a conservative approach with declination for referral to HOCM clinic, cardiac MRI, or referral to EP.  Demand ischemia: Mildly elevated and flat trending, improved from prior.  Likely in the setting of significantly elevated blood pressure with underlying HOCM.  Prior LHC and recent Lexiscan MPI without evidence of obstructive CAD or ischemia respectively.  She has preferred to minimize testing.  She remains on aspirin, and rosuvastatin.  Labile hypertension: Blood pressure is elevated in the office today.  Continue bisoprolol 2.5 mg every other day at her request.  Discontinue as needed hydralazine.  Resume previously prescribed amlodipine 5 mg as needed for systolic blood pressure greater than 160 mmHg.  Mitral valve regurgitation: Moderate and stable on echo in 11/2021.  Follow periodically.  Right renal artery stenosis: Status post right renal artery stenting with most recent ultrasound in 04/2022 showing a patent right renal artery stent with no evidence of renal artery stenosis bilaterally.  Followed by vascular surgery.  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    Disposition: F/u with Dr. Okey Dupre or an APP in 6 months at her request.   Medication Adjustments/Labs and Tests Ordered: Current medicines are reviewed at length with the patient today.  Concerns regarding medicines are outlined above. Medication changes, Labs and Tests ordered today are summarized above and listed in the Patient Instructions accessible in Encounters.   Signed, Eula Listen, PA-C 11/30/2022 1:06 PM     [facility] [place] [address] [place], [place] [address] [phone]

## 2022-11-30 NOTE — Patient Instructions (Signed)
Medication Instructions:  Your physician has recommended you make the following change in your medication:   STOP Hydralazine TAKE AS NEEDED Amlodipine 5 mg once daily for SBP (top blood pressure number) is greater than 160  *If you need a refill on your cardiac medications before your next appointment, please call your pharmacy*   Lab Work: None  If you have labs (blood work) drawn today and your tests are completely normal, you will receive your results only by: MyChart Message (if you have MyChart) OR A paper copy in the mail If you have any lab test that is abnormal or we need to change your treatment, we will call you to review the results.   Testing/Procedures: None   Follow-Up: At [facility], you and your health needs are our priority.  As part of our continuing mission to provide you with exceptional heart care, we have created designated Provider Care Teams.  These Care Teams include your primary Cardiologist (physician) and Advanced Practice Providers (APPs -  Physician Assistants and Nurse Practitioners) who all work together to provide you with the care you need, when you need it.   Your next appointment:   6 month(s)  Provider:   Yvonne Kendall, MD or Eula Listen, PA-C

## 2022-12-06 ENCOUNTER — Other Ambulatory Visit: Payer: Self-pay | Admitting: Internal Medicine

## 2022-12-06 DIAGNOSIS — R1314 Dysphagia, pharyngoesophageal phase: Secondary | ICD-10-CM

## 2022-12-13 ENCOUNTER — Ambulatory Visit
Admission: RE | Admit: 2022-12-13 | Discharge: 2022-12-13 | Disposition: A | Discharge status: Home / Self Care | Payer: Medicare Other | Source: Ambulatory Visit | Attending: Internal Medicine | Admitting: Internal Medicine

## 2022-12-13 DIAGNOSIS — K224 Dyskinesia of esophagus: Secondary | ICD-10-CM | POA: Diagnosis not present

## 2022-12-13 DIAGNOSIS — R1314 Dysphagia, pharyngoesophageal phase: Secondary | ICD-10-CM | POA: Diagnosis not present

## 2022-12-13 DIAGNOSIS — R1310 Dysphagia, unspecified: Secondary | ICD-10-CM | POA: Diagnosis not present

## 2022-12-13 DIAGNOSIS — K449 Diaphragmatic hernia without obstruction or gangrene: Secondary | ICD-10-CM | POA: Diagnosis not present

## 2022-12-18 DIAGNOSIS — E039 Hypothyroidism, unspecified: Secondary | ICD-10-CM | POA: Diagnosis not present

## 2022-12-18 DIAGNOSIS — I10 Essential (primary) hypertension: Secondary | ICD-10-CM | POA: Diagnosis not present

## 2022-12-18 DIAGNOSIS — Z79899 Other long term (current) drug therapy: Secondary | ICD-10-CM | POA: Diagnosis not present

## 2022-12-18 DIAGNOSIS — I6523 Occlusion and stenosis of bilateral carotid arteries: Secondary | ICD-10-CM | POA: Diagnosis not present

## 2022-12-18 DIAGNOSIS — K219 Gastro-esophageal reflux disease without esophagitis: Secondary | ICD-10-CM | POA: Diagnosis not present

## 2022-12-18 DIAGNOSIS — M353 Polymyalgia rheumatica: Secondary | ICD-10-CM | POA: Diagnosis not present

## 2022-12-19 DIAGNOSIS — M353 Polymyalgia rheumatica: Secondary | ICD-10-CM | POA: Diagnosis not present

## 2022-12-19 DIAGNOSIS — R7309 Other abnormal glucose: Secondary | ICD-10-CM | POA: Diagnosis not present

## 2022-12-19 DIAGNOSIS — I6523 Occlusion and stenosis of bilateral carotid arteries: Secondary | ICD-10-CM | POA: Diagnosis not present

## 2022-12-19 DIAGNOSIS — Z79899 Other long term (current) drug therapy: Secondary | ICD-10-CM | POA: Diagnosis not present

## 2022-12-19 DIAGNOSIS — I10 Essential (primary) hypertension: Secondary | ICD-10-CM | POA: Diagnosis not present

## 2023-02-13 ENCOUNTER — Ambulatory Visit: Payer: Medicare Other | Admitting: Dermatology

## 2023-02-18 ENCOUNTER — Emergency Department: Payer: Medicare Other

## 2023-02-18 ENCOUNTER — Emergency Department
Admission: EM | Admit: 2023-02-18 | Discharge: 2023-02-18 | Disposition: A | Discharge status: Home / Self Care | Payer: Medicare Other | Attending: Emergency Medicine | Admitting: Emergency Medicine

## 2023-02-18 ENCOUNTER — Other Ambulatory Visit: Payer: Self-pay

## 2023-02-18 DIAGNOSIS — R58 Hemorrhage, not elsewhere classified: Secondary | ICD-10-CM | POA: Diagnosis not present

## 2023-02-18 DIAGNOSIS — R220 Localized swelling, mass and lump, head: Secondary | ICD-10-CM | POA: Diagnosis not present

## 2023-02-18 DIAGNOSIS — M1812 Unilateral primary osteoarthritis of first carpometacarpal joint, left hand: Secondary | ICD-10-CM | POA: Diagnosis not present

## 2023-02-18 DIAGNOSIS — S022XXA Fracture of nasal bones, initial encounter for closed fracture: Secondary | ICD-10-CM | POA: Insufficient documentation

## 2023-02-18 DIAGNOSIS — W010XXA Fall on same level from slipping, tripping and stumbling without subsequent striking against object, initial encounter: Secondary | ICD-10-CM | POA: Insufficient documentation

## 2023-02-18 DIAGNOSIS — M79642 Pain in left hand: Secondary | ICD-10-CM | POA: Insufficient documentation

## 2023-02-18 DIAGNOSIS — W19XXXA Unspecified fall, initial encounter: Secondary | ICD-10-CM

## 2023-02-18 DIAGNOSIS — R519 Headache, unspecified: Secondary | ICD-10-CM | POA: Diagnosis not present

## 2023-02-18 DIAGNOSIS — M1811 Unilateral primary osteoarthritis of first carpometacarpal joint, right hand: Secondary | ICD-10-CM | POA: Diagnosis not present

## 2023-02-18 DIAGNOSIS — M25531 Pain in right wrist: Secondary | ICD-10-CM | POA: Insufficient documentation

## 2023-02-18 DIAGNOSIS — I10 Essential (primary) hypertension: Secondary | ICD-10-CM | POA: Diagnosis not present

## 2023-02-18 DIAGNOSIS — I959 Hypotension, unspecified: Secondary | ICD-10-CM | POA: Diagnosis not present

## 2023-02-18 DIAGNOSIS — S6992XA Unspecified injury of left wrist, hand and finger(s), initial encounter: Secondary | ICD-10-CM | POA: Diagnosis not present

## 2023-02-18 DIAGNOSIS — S6991XA Unspecified injury of right wrist, hand and finger(s), initial encounter: Secondary | ICD-10-CM | POA: Diagnosis not present

## 2023-02-18 DIAGNOSIS — S0993XA Unspecified injury of face, initial encounter: Secondary | ICD-10-CM | POA: Diagnosis not present

## 2023-02-18 MED ORDER — ACETAMINOPHEN 325 MG PO TABS
650.0000 mg | ORAL_TABLET | Freq: Once | ORAL | Status: AC
  Administered 2023-02-18: 650 mg via ORAL
  Filled 2023-02-18: qty 2

## 2023-02-18 MED ORDER — OXYMETAZOLINE HCL 0.05 % NA SOLN
1.0000 | Freq: Once | NASAL | Status: AC
  Administered 2023-02-18: 1 via NASAL
  Filled 2023-02-18: qty 30

## 2023-02-18 NOTE — ED Triage Notes (Signed)
Pt to ED via EMS from home, pt had unwitnessed fall pt states she tripped on her shoes. Pt c/o r wrist pain, r face hematoma, and nose bleed. Bleeding controlled at this time. Pt state she's takes a daily aspirin. Denies LOC.

## 2023-02-18 NOTE — ED Notes (Signed)
See triage notes. Patient suffered a fall today resulting in right wrist pain, a hematoma to the right face and a nose bleed.

## 2023-02-18 NOTE — ED Provider Notes (Signed)
[facility] Provider Note  Patient Contact: 3:21 PM (approximate)   History   Fall   HPI  Kathryn Wise is a 89 y.o. female with a history of hypothyroidism, hypertension, GERD and arthritis, presents to the emergency department after patient had a mechanical fall at home.  Patient states that her shoe tripped around a counter causing her fall.  She states that prior to fall, she was polishing of cooler for flowers.  She hit her face and head and does take a daily aspirin.  She is complaining of right wrist pain and left hand pain.  Her tetanus status is up-to-date.  No numbness or tingling in the upper and lower extremities.  No chest pain, chest tightness or abdominal pain.      Physical Exam   Triage Vital Signs: ED Triage Vitals  Encounter Vitals Group     BP 02/18/23 1434 (!) 159/64     Systolic BP Percentile --      Diastolic BP Percentile --      Pulse Rate 02/18/23 1434 60     Resp 02/18/23 1434 16     Temp 02/18/23 1434 97.8 F (36.6 C)     Temp Source 02/18/23 1434 Oral     SpO2 02/18/23 1434 98 %     Weight 02/18/23 1433 150 lb (68 kg)     Height 02/18/23 1433 5\' 5"  (1.651 m)     Head Circumference --      Peak Flow --      Pain Score 02/18/23 1432 0     Pain Loc --      Pain Education --      Exclude from Growth Chart --     Most recent vital signs: Vitals:   02/18/23 1434  BP: (!) 159/64  Pulse: 60  Resp: 16  Temp: 97.8 F (36.6 C)  SpO2: 98%     General: Alert and in no acute distress. Eyes:  PERRL. EOMI. Head: No acute traumatic findings.  Patient has multiple facial abrasions. ENT:      Nose: No congestion/rhinnorhea.      Mouth/Throat: Mucous membranes are moist.  Neck: No stridor. No cervical spine tenderness to palpation. Cardiovascular:  Good peripheral perfusion Respiratory: Normal respiratory effort without tachypnea or retractions. Lungs CTAB. Good air entry to the bases with no decreased or absent breath  sounds. Gastrointestinal: Bowel sounds 4 quadrants. Soft and nontender to palpation. No guarding or rigidity. No palpable masses. No distention. No CVA tenderness. Musculoskeletal: Full range of motion to all extremities.  Patient has pain with range of motion of the right wrist. Neurologic:  No gross focal neurologic deficits are appreciated.  Skin: Patient has abrasions of left wrist and right wrist.    ED Results / Procedures / Treatments   Labs (all labs ordered are listed, but only abnormal results are displayed) Labs Reviewed - No data to display      RADIOLOGY  I personally viewed and evaluated these images as part of my medical decision making, as well as reviewing the written report by the radiologist.  ED Provider Interpretation: Acute and mildly displaced nasal bone fracture.  There is also likely a maxillary spine fracture.   PROCEDURES:  Critical Care performed: No  Procedures   MEDICATIONS ORDERED IN ED: Medications  oxymetazoline (AFRIN) 0.05 % nasal spray 1 spray (1 spray Each Nare Given 02/18/23 1541)  acetaminophen (TYLENOL) tablet 650 mg (650 mg Oral Given 02/18/23 1540)  IMPRESSION / MDM / ASSESSMENT AND PLAN / ED COURSE  I reviewed the triage vital signs and the nursing notes.                              Assessment and plan Fall 89 year old female presents to the emergency department after mechanical fall that occurred in her home.  Patient reports that she caught her shoe around the counter.  Patient was hypertensive at triage but vital signs were otherwise reassuring.  On exam, patient was alert and provided her own historical information.  Differential diagnosis included skull fracture, intracranial bleed, facial fracture, fractures of the right wrist and left hand.  No acute bony abnormality on x-rays of the extremities.  Patient does have a mildly displaced nasal bone fracture and possible maxillary spine fracture.  Patient's wounds  were cleansed in the emergency department and dressings were applied.  She was given follow-up referral with Dr. Andee Poles.  Recommended Tylenol for pain.  Return precautions were given to return with new or worsening symptoms.       FINAL CLINICAL IMPRESSION(S) / ED DIAGNOSES   Final diagnoses:  Fall, initial encounter  Closed fracture of nasal bone, initial encounter     Rx / DC Orders   ED Discharge Orders     None        Note:  This document was prepared using Dragon voice recognition software and may include unintentional dictation errors.   Pia Mau Bardolph, PA-C 02/18/23 1647    Jene Every, MD 02/18/23 1754

## 2023-02-18 NOTE — Discharge Instructions (Signed)
Please make follow-up appointment with Dr. Andee Poles. You can take 650 mg of Tylenol every 6 hours as needed for pain.

## 2023-02-25 DIAGNOSIS — S022XXA Fracture of nasal bones, initial encounter for closed fracture: Secondary | ICD-10-CM | POA: Diagnosis not present

## 2023-03-01 ENCOUNTER — Inpatient Hospital Stay
Admission: EM | Admit: 2023-03-01 | Discharge: 2023-03-03 | DRG: 689 | Disposition: A | Discharge status: Home / Self Care | Payer: Medicare Other | Attending: Internal Medicine | Admitting: Internal Medicine

## 2023-03-01 ENCOUNTER — Emergency Department: Payer: Medicare Other

## 2023-03-01 ENCOUNTER — Other Ambulatory Visit: Payer: Self-pay

## 2023-03-01 DIAGNOSIS — K219 Gastro-esophageal reflux disease without esophagitis: Secondary | ICD-10-CM | POA: Diagnosis not present

## 2023-03-01 DIAGNOSIS — Z79890 Hormone replacement therapy (postmenopausal): Secondary | ICD-10-CM | POA: Diagnosis not present

## 2023-03-01 DIAGNOSIS — K862 Cyst of pancreas: Secondary | ICD-10-CM | POA: Diagnosis not present

## 2023-03-01 DIAGNOSIS — N3000 Acute cystitis without hematuria: Principal | ICD-10-CM

## 2023-03-01 DIAGNOSIS — I701 Atherosclerosis of renal artery: Secondary | ICD-10-CM | POA: Diagnosis present

## 2023-03-01 DIAGNOSIS — I2489 Other forms of acute ischemic heart disease: Secondary | ICD-10-CM | POA: Diagnosis present

## 2023-03-01 DIAGNOSIS — R000 Tachycardia, unspecified: Secondary | ICD-10-CM | POA: Diagnosis not present

## 2023-03-01 DIAGNOSIS — E785 Hyperlipidemia, unspecified: Secondary | ICD-10-CM | POA: Diagnosis present

## 2023-03-01 DIAGNOSIS — R069 Unspecified abnormalities of breathing: Secondary | ICD-10-CM | POA: Diagnosis not present

## 2023-03-01 DIAGNOSIS — R778 Other specified abnormalities of plasma proteins: Secondary | ICD-10-CM

## 2023-03-01 DIAGNOSIS — I340 Nonrheumatic mitral (valve) insufficiency: Secondary | ICD-10-CM | POA: Diagnosis present

## 2023-03-01 DIAGNOSIS — D72829 Elevated white blood cell count, unspecified: Secondary | ICD-10-CM

## 2023-03-01 DIAGNOSIS — Z85828 Personal history of other malignant neoplasm of skin: Secondary | ICD-10-CM | POA: Diagnosis not present

## 2023-03-01 DIAGNOSIS — I110 Hypertensive heart disease with heart failure: Secondary | ICD-10-CM | POA: Diagnosis present

## 2023-03-01 DIAGNOSIS — R7989 Other specified abnormal findings of blood chemistry: Secondary | ICD-10-CM | POA: Diagnosis not present

## 2023-03-01 DIAGNOSIS — W19XXXA Unspecified fall, initial encounter: Secondary | ICD-10-CM | POA: Diagnosis present

## 2023-03-01 DIAGNOSIS — I959 Hypotension, unspecified: Secondary | ICD-10-CM | POA: Diagnosis not present

## 2023-03-01 DIAGNOSIS — Z961 Presence of intraocular lens: Secondary | ICD-10-CM | POA: Diagnosis present

## 2023-03-01 DIAGNOSIS — R9431 Abnormal electrocardiogram [ECG] [EKG]: Secondary | ICD-10-CM | POA: Diagnosis not present

## 2023-03-01 DIAGNOSIS — I447 Left bundle-branch block, unspecified: Secondary | ICD-10-CM | POA: Diagnosis present

## 2023-03-01 DIAGNOSIS — I214 Non-ST elevation (NSTEMI) myocardial infarction: Secondary | ICD-10-CM | POA: Diagnosis not present

## 2023-03-01 DIAGNOSIS — E039 Hypothyroidism, unspecified: Secondary | ICD-10-CM | POA: Diagnosis present

## 2023-03-01 DIAGNOSIS — I6522 Occlusion and stenosis of left carotid artery: Secondary | ICD-10-CM | POA: Diagnosis not present

## 2023-03-01 DIAGNOSIS — Z96612 Presence of left artificial shoulder joint: Secondary | ICD-10-CM | POA: Diagnosis present

## 2023-03-01 DIAGNOSIS — R6883 Chills (without fever): Secondary | ICD-10-CM | POA: Diagnosis not present

## 2023-03-01 DIAGNOSIS — I25118 Atherosclerotic heart disease of native coronary artery with other forms of angina pectoris: Secondary | ICD-10-CM | POA: Diagnosis not present

## 2023-03-01 DIAGNOSIS — N10 Acute tubulo-interstitial nephritis: Secondary | ICD-10-CM | POA: Diagnosis present

## 2023-03-01 DIAGNOSIS — I700 Atherosclerosis of aorta: Secondary | ICD-10-CM | POA: Diagnosis not present

## 2023-03-01 DIAGNOSIS — R109 Unspecified abdominal pain: Secondary | ICD-10-CM | POA: Diagnosis not present

## 2023-03-01 DIAGNOSIS — A419 Sepsis, unspecified organism: Secondary | ICD-10-CM | POA: Diagnosis not present

## 2023-03-01 DIAGNOSIS — Z9841 Cataract extraction status, right eye: Secondary | ICD-10-CM

## 2023-03-01 DIAGNOSIS — I5032 Chronic diastolic (congestive) heart failure: Secondary | ICD-10-CM | POA: Diagnosis not present

## 2023-03-01 DIAGNOSIS — K449 Diaphragmatic hernia without obstruction or gangrene: Secondary | ICD-10-CM | POA: Diagnosis present

## 2023-03-01 DIAGNOSIS — S0083XA Contusion of other part of head, initial encounter: Secondary | ICD-10-CM | POA: Diagnosis present

## 2023-03-01 DIAGNOSIS — M1712 Unilateral primary osteoarthritis, left knee: Secondary | ICD-10-CM | POA: Diagnosis not present

## 2023-03-01 DIAGNOSIS — I479 Paroxysmal tachycardia, unspecified: Secondary | ICD-10-CM | POA: Diagnosis present

## 2023-03-01 DIAGNOSIS — Z888 Allergy status to other drugs, medicaments and biological substances status: Secondary | ICD-10-CM

## 2023-03-01 DIAGNOSIS — I5031 Acute diastolic (congestive) heart failure: Secondary | ICD-10-CM | POA: Diagnosis present

## 2023-03-01 DIAGNOSIS — Z7982 Long term (current) use of aspirin: Secondary | ICD-10-CM

## 2023-03-01 DIAGNOSIS — R6889 Other general symptoms and signs: Secondary | ICD-10-CM

## 2023-03-01 DIAGNOSIS — Z1152 Encounter for screening for COVID-19: Secondary | ICD-10-CM | POA: Diagnosis not present

## 2023-03-01 DIAGNOSIS — I6529 Occlusion and stenosis of unspecified carotid artery: Secondary | ICD-10-CM | POA: Diagnosis present

## 2023-03-01 DIAGNOSIS — I2510 Atherosclerotic heart disease of native coronary artery without angina pectoris: Secondary | ICD-10-CM | POA: Diagnosis present

## 2023-03-01 DIAGNOSIS — M19012 Primary osteoarthritis, left shoulder: Secondary | ICD-10-CM | POA: Diagnosis present

## 2023-03-01 DIAGNOSIS — I421 Obstructive hypertrophic cardiomyopathy: Secondary | ICD-10-CM | POA: Diagnosis not present

## 2023-03-01 DIAGNOSIS — R0602 Shortness of breath: Secondary | ICD-10-CM | POA: Diagnosis not present

## 2023-03-01 DIAGNOSIS — N390 Urinary tract infection, site not specified: Secondary | ICD-10-CM | POA: Diagnosis not present

## 2023-03-01 DIAGNOSIS — B9620 Unspecified Escherichia coli [E. coli] as the cause of diseases classified elsewhere: Secondary | ICD-10-CM | POA: Diagnosis not present

## 2023-03-01 DIAGNOSIS — Z8619 Personal history of other infectious and parasitic diseases: Secondary | ICD-10-CM

## 2023-03-01 DIAGNOSIS — I4719 Other supraventricular tachycardia: Secondary | ICD-10-CM

## 2023-03-01 DIAGNOSIS — Z9842 Cataract extraction status, left eye: Secondary | ICD-10-CM

## 2023-03-01 DIAGNOSIS — Z885 Allergy status to narcotic agent status: Secondary | ICD-10-CM

## 2023-03-01 DIAGNOSIS — M25562 Pain in left knee: Secondary | ICD-10-CM | POA: Diagnosis not present

## 2023-03-01 DIAGNOSIS — Z87891 Personal history of nicotine dependence: Secondary | ICD-10-CM

## 2023-03-01 DIAGNOSIS — Z79899 Other long term (current) drug therapy: Secondary | ICD-10-CM

## 2023-03-01 LAB — CULTURE, BLOOD (ROUTINE X 2)
Culture: NO GROWTH
Culture: NO GROWTH
Special Requests: ADEQUATE
Special Requests: ADEQUATE

## 2023-03-01 LAB — COMPREHENSIVE METABOLIC PANEL
ALT: 12 U/L (ref 0–44)
AST: 25 U/L (ref 15–41)
Albumin: 3.6 g/dL (ref 3.5–5.0)
Alkaline Phosphatase: 38 U/L (ref 38–126)
Anion gap: 11 (ref 5–15)
BUN: 19 mg/dL (ref 8–23)
CO2: 20 mmol/L — ABNORMAL LOW (ref 22–32)
Calcium: 8.8 mg/dL — ABNORMAL LOW (ref 8.9–10.3)
Chloride: 102 mmol/L (ref 98–111)
Creatinine, Ser: 0.9 mg/dL (ref 0.44–1.00)
GFR, Estimated: >60 mL/min (ref >60)
Glucose, Bld: 121 mg/dL — ABNORMAL HIGH (ref 70–99)
Potassium: 4.2 mmol/L (ref 3.5–5.1)
Sodium: 133 mmol/L — ABNORMAL LOW (ref 135–145)
Total Bilirubin: 1.8 mg/dL — ABNORMAL HIGH (ref 0.3–1.2)
Total Protein: 6.6 g/dL (ref 6.5–8.1)

## 2023-03-01 LAB — URINALYSIS, W/ REFLEX TO CULTURE (INFECTION SUSPECTED)
Bilirubin Urine: NEGATIVE
Glucose, UA: NEGATIVE mg/dL
Ketones, ur: 5 mg/dL — AB
Nitrite: POSITIVE — AB
Protein, ur: 30 mg/dL — AB
Specific Gravity, Urine: 1.013 (ref 1.005–1.030)
Squamous Epithelial / HPF: NONE SEEN /HPF (ref 0–5)
WBC, UA: >50 WBC/hpf (ref 0–5)
pH: 5 (ref 5.0–8.0)

## 2023-03-01 LAB — CBC WITH DIFFERENTIAL/PLATELET
Abs Immature Granulocytes: 0.13 10*3/uL — ABNORMAL HIGH (ref 0.00–0.07)
Basophils Absolute: 0.1 10*3/uL (ref 0.0–0.1)
Basophils Relative: 0 %
Eosinophils Absolute: 0 10*3/uL (ref 0.0–0.5)
Eosinophils Relative: 0 %
HCT: 35.5 % — ABNORMAL LOW (ref 36.0–46.0)
Hemoglobin: 11.7 g/dL — ABNORMAL LOW (ref 12.0–15.0)
Immature Granulocytes: 1 %
Lymphocytes Relative: 7 %
Lymphs Abs: 1.5 10*3/uL (ref 0.7–4.0)
MCH: 28 pg (ref 26.0–34.0)
MCHC: 33 g/dL (ref 30.0–36.0)
MCV: 84.9 fL (ref 80.0–100.0)
Monocytes Absolute: 1.3 10*3/uL — ABNORMAL HIGH (ref 0.1–1.0)
Monocytes Relative: 7 %
Neutro Abs: 17.7 10*3/uL — ABNORMAL HIGH (ref 1.7–7.7)
Neutrophils Relative %: 85 %
Platelets: 179 10*3/uL (ref 150–400)
RBC: 4.18 MIL/uL (ref 3.87–5.11)
RDW: 15.2 % (ref 11.5–15.5)
Smear Review: NORMAL
WBC: 21.2 10*3/uL — ABNORMAL HIGH (ref 4.0–10.5)
nRBC: 0 % (ref 0.0–0.2)

## 2023-03-01 LAB — TROPONIN I (HIGH SENSITIVITY)
Troponin I (High Sensitivity): 122 ng/L (ref <18)
Troponin I (High Sensitivity): 511 ng/L (ref <18)

## 2023-03-01 LAB — SARS CORONAVIRUS 2 BY RT PCR: SARS Coronavirus 2 by RT PCR: NEGATIVE

## 2023-03-01 LAB — LACTIC ACID, PLASMA
Lactic Acid, Venous: 1.2 mmol/L (ref 0.5–1.9)
Lactic Acid, Venous: 1 mmol/L (ref 0.5–1.9)

## 2023-03-01 LAB — BRAIN NATRIURETIC PEPTIDE: B Natriuretic Peptide: 1726.1 pg/mL — ABNORMAL HIGH (ref 0.0–100.0)

## 2023-03-01 MED ORDER — SODIUM CHLORIDE 0.9% FLUSH: 3.0000 mL | INTRAVENOUS | Status: DC | PRN

## 2023-03-01 MED ORDER — SODIUM CHLORIDE 0.9 % IV SOLN
2.0000 g | INTRAVENOUS | Status: DC
  Administered 2023-03-01 – 2023-03-03 (×3): 2 g via INTRAVENOUS
  Filled 2023-03-01 (×3): qty 20

## 2023-03-01 MED ORDER — FUROSEMIDE 10 MG/ML IJ SOLN
20.0000 mg | Freq: Once | INTRAMUSCULAR | Status: AC
  Administered 2023-03-01: 20 mg via INTRAVENOUS
  Filled 2023-03-01: qty 4

## 2023-03-01 MED ORDER — LEVOTHYROXINE SODIUM 88 MCG PO TABS
88.0000 ug | ORAL_TABLET | Freq: Every day | ORAL | Status: DC
  Administered 2023-03-01 – 2023-03-02 (×2): 88 ug via ORAL
  Filled 2023-03-01 (×2): qty 1

## 2023-03-01 MED ORDER — SODIUM CHLORIDE 0.9% FLUSH
3.0000 mL | Freq: Two times a day (BID) | INTRAVENOUS | Status: DC
  Administered 2023-03-01 – 2023-03-03 (×5): 3 mL via INTRAVENOUS

## 2023-03-01 MED ORDER — ASPIRIN 81 MG PO TBEC
81.0000 mg | DELAYED_RELEASE_TABLET | Freq: Every day | ORAL | Status: DC
  Administered 2023-03-01 – 2023-03-03 (×3): 81 mg via ORAL
  Filled 2023-03-01 (×3): qty 1

## 2023-03-01 MED ORDER — BISOPROLOL FUMARATE 5 MG PO TABS
5.0000 mg | ORAL_TABLET | Freq: Every day | ORAL | Status: DC
  Administered 2023-03-02: 5 mg via ORAL
  Filled 2023-03-01 (×3): qty 1

## 2023-03-01 MED ORDER — ONDANSETRON HCL 4 MG/2ML IJ SOLN: 4.0000 mg | Freq: Four times a day (QID) | INTRAMUSCULAR | Status: DC | PRN

## 2023-03-01 MED ORDER — ASPIRIN 81 MG PO CHEW
324.0000 mg | CHEWABLE_TABLET | Freq: Once | ORAL | Status: AC
  Administered 2023-03-01: 324 mg via ORAL
  Filled 2023-03-01: qty 4

## 2023-03-01 MED ORDER — LEVOTHYROXINE SODIUM 50 MCG PO TABS: 75.0000 ug | ORAL_TABLET | Freq: Every day | ORAL | Status: DC

## 2023-03-01 MED ORDER — SODIUM CHLORIDE 0.9 % IV BOLUS
1000.0000 mL | Freq: Once | INTRAVENOUS | Status: AC
  Administered 2023-03-01: 1000 mL via INTRAVENOUS

## 2023-03-01 MED ORDER — ONDANSETRON HCL 4 MG PO TABS: 4.0000 mg | ORAL_TABLET | Freq: Four times a day (QID) | ORAL | Status: DC | PRN

## 2023-03-01 MED ORDER — LACTATED RINGERS IV SOLN: INTRAVENOUS | Status: DC

## 2023-03-01 MED ORDER — SODIUM CHLORIDE 0.9 % IV SOLN: 250.0000 mL | INTRAVENOUS | Status: DC | PRN

## 2023-03-01 MED ORDER — TEMAZEPAM 7.5 MG PO CAPS
15.0000 mg | ORAL_CAPSULE | Freq: Every day | ORAL | Status: DC
  Administered 2023-03-01 – 2023-03-02 (×2): 15 mg via ORAL
  Filled 2023-03-01 (×2): qty 2

## 2023-03-01 MED ORDER — ROSUVASTATIN CALCIUM 10 MG PO TABS
10.0000 mg | ORAL_TABLET | Freq: Every day | ORAL | Status: DC
  Administered 2023-03-01 – 2023-03-02 (×2): 10 mg via ORAL
  Filled 2023-03-01 (×2): qty 1

## 2023-03-01 MED ORDER — LEVOTHYROXINE SODIUM 50 MCG PO TABS
75.0000 ug | ORAL_TABLET | Freq: Every day | ORAL | Status: DC
  Filled 2023-03-01: qty 1

## 2023-03-01 MED ORDER — PANTOPRAZOLE SODIUM 40 MG PO TBEC
40.0000 mg | DELAYED_RELEASE_TABLET | Freq: Every day | ORAL | Status: DC
  Administered 2023-03-01 – 2023-03-03 (×3): 40 mg via ORAL
  Filled 2023-03-01 (×3): qty 1

## 2023-03-01 MED ORDER — ENOXAPARIN SODIUM 40 MG/0.4ML IJ SOSY
40.0000 mg | PREFILLED_SYRINGE | INTRAMUSCULAR | Status: DC
  Filled 2023-03-01 (×2): qty 0.4

## 2023-03-01 NOTE — Assessment & Plan Note (Signed)
Continue statin. 

## 2023-03-01 NOTE — Consult Note (Signed)
Cardiology Consultation   Patient ID: Kathryn Wise MRN: [id number]; DOB: [birth year]  Admit date: 03/01/2023 Date of Consult: 03/01/2023  PCP:  Enid Baas, MD   [facility] Providers Cardiologist:  Yvonne Kendall, MD        Patient Profile:   Kathryn Wise is a 89 y.o. female with a hx of nonobstructive CAD, HOCM, HFpEF, difficult to control hypertension with left renal artery stenosis status post stenting in 12/2018, carotid artery disease status post left-sided stenting in 09/2020, possible small SDH noted on 10/2021, chronic LBBB, hyperlipidemia, and mitral regurgitation who is being seen 03/01/2023 for the evaluation of HOCM, HFpEF, elevated high-sensitivity troponins at the request of Dr. Alvester Morin.  History of Present Illness:   Kathryn Wise is a 89 with echo demonstrating EF of 60 to 65%, normal wall motion, mild concentric LVH of the septum and mild LVOT gradient, G1 DD, mildly dilated aortic root and ascending aorta with mild mitral regurgitation, normal RV systolic function and PASP.  She denies being admitted for chest pain 05/2017 with Lexiscan being abnormal.  She subsequently underwent left heart cath in 05/2017 which demonstrated nonobstructive CAD involving the left circumflex and RCA with medical management recommended.  Repeat echocardiogram in 05/2017 revealed an EF of 55 to 60%, normal wall motion, mild focal basal mild concentric LVH, near cavity obliteration in systole, G1 DD, very mild aortic stenosis, moderate mitral regurgitation, mildly dilated left atrium, normal LV systolic function, PASP of 50 mmHg.  Echo in 09/2020 demonstrated LVEF 55 to 60%, moderate LVH, G2 DD with moderate LVOT obstruction and a peak gradient of 16 mmHg at rest and 74 mmHg with Valsalva.  In the setting of pneumonia high-sensitivity troponins trended to 976.  She was lost to follow-up.  She was again admitted in 09/2021 with hypertensive urgency with noted improvement in blood pressure  following amlodipine and IV labetalol.  Mildly elevated high-sensitivity troponin peaking at 36 felt to be related to supply demand ischemia from uncontrolled hypertension.  She was readmitted to the hospital 10/2021 noting left upper extremity numbness that was transient, bilateral lower extremity swelling, generalized weakness.  She was consulted on by neurosurgery for possible TIA.  MRI of the brain showed possible 2 mm subdural hematoma overlying the right cerebral convexity.  It was not well-visualized on CT imaging.  She was unaware of any fall/trauma.  Echo as read by an outside group showed an LVEF of 50-55%, no regional wall motion abnormalities, mild concentric LVH, G1 DD, normal RV systolic function and ventricular cavity size myxomatous mitral valve and mild stenosis, mild aortic insufficiency.  Lower extremity ultrasound was negative for DVT bilaterally high-sensitivity troponin of 28 with a delta troponin of 29 and a BNP of 418.  Lexiscan MPI 11/27/2021 was overall low risk without evidence of infarction or ischemia.  CT attenuation correction images showed mild coronary artery calcification and aortic atherosclerosis.  Echo 12/07/2021 demonstrated an EF of 60-65%, no regional wall motion abnormalities, moderate LVH with severe basal septal hypertrophy with an LV gradient of 39 mmHg at rest and 52 mm with Valsalva with 1 measurement of 127 mmHg.  She had multiple office visits where she had complained of myalgias and limited mobility due to diltiazem and had self adjusted her dosing.  Diltiazem was subsequently discontinued and she was transitioned to low-dose carvedilol.  She notified the office in August 2023 that carvedilol was working well for her blood pressure the causing fatigue and weakness leading it to  be discontinued.  She was placed back on amlodipine 10 mg daily which she had previously tolerated without issues.  In 9/23 she noted an increase in bilateral lower extremity swelling that began  after reinitiating amlodipine.  She noticed an improvement in her energy following discontinuation of carvedilol.  She preferred to remain on amlodipine at the time.  She was seen by PCP 08/17/2022 for elevated blood pressure and placed on hydralazine.  While at the hairdresser, later that day, while sitting into the hairdresser she developed sudden onset of tachypalpitations with associated dizziness and near syncope.  Symptoms lasted a few seconds and spontaneously resolved.  She was admitted to [facility] from 1/19 through 08/18/2022 with atrial tachycardia, near syncope in the setting of hokum.  High-sensitivity troponin peaked at 3 of 5 was felt to represent demand ischemia in setting of atrial tachycardia with known HOCM.  BNP was 710.  She had the initiation of bisoprolol with documented improvement in tachycardia palpitation burden.  Patient was discharged on bisoprolol 5 mg daily.  And a follow-up office visit in 1/24 she was without symptoms and had contacted the office previously about her heart rates been in the 50s and buspirone was reduced to 2.5 mg daily.  She was evaluated in the emergency department for/7/24 with elevated blood pressure readings with blood pressure 219/95, high-sensitivity troponin mildly elevated and flat trending peaked at 44.  She was given 5 mg of IV hydralazine and advised to follow-up as outpatient.  She was last seen in clinic 11/30/2022 where she was doing well from the cardiac perspective.  She did not believe that her as needed hydralazine had helped her much and at times had resumed as needed amlodipine 5 mg she preferred to transition back to amlodipine on an as-needed basis.  She was currently taking buspirone 2.5 mg every other day.  She presented to the emergency department on 02/18/2023 status post fall where she had hit her face and head.  She was complaining of right wrist pain and left hand pain.  Blood pressure was poorly controlled at 159/64.  X-rays revealed acute mildly  displaced nasal bone fracture and likely maxillary spine fracture.  She was treated with Afrin and Tylenol and discharged home.  She presented back to the [facility] emergency department today with shortness of breath, sweating, and chills.  She presented from home via EMS with generalized fatigue, chills throughout the night shortness of breath and weakness starting yesterday evening and lasting through the night to the point that she was unable to move this morning when EMTs arrived.  According to family that remains at the bedside and the patient was altered until after she had received her first dose of antibiotics while in the emergency department but after receiving medications that she is back at baseline. She has had some shortness of breath and a nonproductive cough but denies any chest pain or palpitations.  Initial vitals: Blood pressure 91/43, pulse 41, respirations of 17  Pertinent labs: Sodium 133, CO2 20, blood glucose of 121, calcium 8.8, WBCs 21.2, hemoglobin 11.7, urinalysis positive for nitrates and moderate leukocytes, high-sensitivity troponin 122 and 511, BNP 1726.1, COVID PCR screen negative  Imaging: Chest x-ray reveals stable mild cardiac enlargement with mild pulmonary vascular prominence which may reflect mild pulmonary venous hypertension  Medications administered in the emergency department: Normal saline bolus of 1 L, aspirin 324 mg, LR, Rocephin 2 g IVPB  Cardiology consulted for HFpEF, elevated high-sensitivity troponin, and history of HOCM  Past Medical History:  Diagnosis Date   Arthritis    Collagen vascular disease (HCC)    Dysphagia, pharyngoesophageal phase 09/10/2013   Upper GI study with barium swallow was done at  [facility] Mar 2015   No reflux seen  Small irreducible hiatal hernia  Mild changes of presbyeophagus (abnormal contractions of the esophagus that occur with aging) No strictures Normal gastric emptying  Incomplete visualization of stomach fold due to  patient's inability turn     GERD (gastroesophageal reflux disease)    Heart murmur    has had years and years   History of shingles Dec 2013   treated with steroids , post op from shoulder surgery   History of squamous cell carcinoma 05/02/2016   right mid lateral pretibial   Hypertension    Hypothyroidism    Major depressive disorder, single episode 11/05/2015   Osteoarthritis of left shoulder 07/14/2012   Pneumonia 10/21/2020   Squamous cell carcinoma of skin 05/12/2016   R mid lat pretibial - other skin cancers treated by Dr. Jarold Motto   Squamous cell carcinoma of skin 08/22/2020   left distal medial popliteal - [facility]    Past Surgical History:  Procedure Laterality Date   ARTHOSCOPIC ROTAOR CUFF REPAIR     LEFT  10+  YEARS     CAROTID PTA/STENT INTERVENTION Left 10/19/2020   Procedure: CAROTID PTA/STENT INTERVENTION;  Surgeon: Renford Dills, MD;  Location: [facility] INVASIVE CV LAB;  Service: Cardiovascular;  Laterality: Left;   CATARACT EXTRACTION W/ INTRAOCULAR LENS IMPLANT     RIGHT EYE   CATARACT EXTRACTION W/PHACO Left 03/28/2016   Procedure: CATARACT EXTRACTION PHACO AND INTRAOCULAR LENS PLACEMENT (IOC);  Surgeon: Lockie Mola, MD;  Location: [facility];  Service: Ophthalmology;  Laterality: Left;  TORIC   FACELIFT     FOOT SURGERY     rt foot   TUMOR REMOVED   JOINT REPLACEMENT Left Dec 2013   shoulder   LEFT HEART CATH AND CORONARY ANGIOGRAPHY N/A 06/12/2017   Procedure: LEFT HEART CATH AND CORONARY ANGIOGRAPHY;  Surgeon: Antonieta Iba, MD;  Location: [facility] INVASIVE CV LAB;  Service: Cardiovascular;  Laterality: N/A;   RENAL ANGIOGRAPHY Left 01/07/2019   Procedure: RENAL ANGIOGRAPHY;  Surgeon: Renford Dills, MD;  Location: [facility] INVASIVE CV LAB;  Service: Cardiovascular;  Laterality: Left;   RENAL ARTERY STENT     SKIN CANCER EXCISION     TOTAL SHOULDER ARTHROPLASTY  07/14/2012   Procedure: TOTAL SHOULDER ARTHROPLASTY;  Surgeon: Eulas Post, MD;   Location: [facility] OR;  Service: Orthopedics;  Laterality: Left;     Home Medications:  Prior to Admission medications   Medication Sig Start Date End Date Taking? Authorizing Provider  aspirin EC 81 MG tablet Take 81 mg by mouth daily.   Yes [provider]  Cholecalciferol (VITAMIN D-3) 1000 units CAPS Take 1,000 Units by mouth daily.   Yes [provider]  cyanocobalamin (VITAMIN B12) 1000 MCG tablet Take 1,000 mcg by mouth daily.   Yes [provider]  levothyroxine (SYNTHROID) 75 MCG tablet Take 75 mcg by mouth daily before breakfast. 10/12/22  Yes [provider]  omeprazole (PRILOSEC) 20 MG capsule TAKE 1 CAPSULE(20 MG) BY MOUTH EVERY MORNING AS NEEDED FOR HEARTBURN 06/13/22  Yes Sherlene Shams, MD  rosuvastatin (CRESTOR) 10 MG tablet Take 1 tablet (10 mg total) by mouth at bedtime. 03/12/22  Yes Sherlene Shams, MD  temazepam (RESTORIL) 15 MG capsule TAKE 1 CAPSULE BY  MOUTH AT BEDTIME AS NEEDED SLEEP 03/05/22  Yes Sherlene Shams, MD  amLODipine (NORVASC) 5 MG tablet Take 1 tablet (5 mg total) by mouth as needed (Once daily AS NEEDED for SBP (top blood pressure number) is greater than 160.). 11/30/22 02/28/23  Sondra Barges, PA-C  bisoprolol (ZEBETA) 5 MG tablet Take 0.5 tablet (2.5 mg) by mouth once daily Patient taking differently: Take 5 mg by mouth daily. 08/24/22   Dunn, Raymon Mutton, PA-C  levothyroxine (SYNTHROID) 88 MCG tablet TAKE 1 TABLET(88 MCG) BY MOUTH DAILY BEFORE BREAKFAST Patient not taking: Reported on 11/04/2022 03/10/22   Eulis Foster, FNP  nitroGLYCERIN (NITROSTAT) 0.4 MG SL tablet Place 1 tablet (0.4 mg total) under the tongue every 5 (five) minutes as needed for chest pain. 08/18/22   Willeen Niece, MD  sucralfate (CARAFATE) 1 g tablet Take 1 tablet (1 g total) by mouth 4 (four) times daily. 11/04/22 11/04/23  Phineas Semen, MD  Turmeric 500 MG CAPS Take 500 mg by mouth daily.    [provider]    Inpatient Medications: Scheduled Meds:   aspirin EC  81 mg Oral Daily   bisoprolol  5 mg Oral Daily   enoxaparin (LOVENOX) injection  40 mg Subcutaneous Q24H   furosemide  20 mg Intravenous Once   [START ON 03/02/2023] levothyroxine  75 mcg Oral QAC breakfast   pantoprazole  40 mg Oral Daily   rosuvastatin  10 mg Oral QHS   sodium chloride flush  3 mL Intravenous Q12H   Continuous Infusions:  sodium chloride     cefTRIAXone (ROCEPHIN)  IV Stopped (03/01/23 1030)   lactated ringers 150 mL/hr at 03/01/23 1002   PRN Meds: sodium chloride, ondansetron **OR** ondansetron (ZOFRAN) IV, sodium chloride flush  Allergies:    Allergies  Allergen Reactions   Alendronate Swelling    And nausea     Codeine    Lisinopril     Spike in BP   Metoprolol Other (See Comments)    Lethargy - PT CURRENTLY TAKING    Oxycodone     Other reaction(s): Other (See Comments) Altered mental status   Oxycontin [Oxycodone Hcl]     Altered mental status    Social History:   Social History   Socioeconomic History   Marital status: Widowed    Spouse name: Not on file   Number of children: Not on file   Years of education: Not on file   Highest education level: Not on file  Occupational History   Not on file  Tobacco Use   Smoking status: Former    Current packs/day: 0.00    Types: Cigarettes    Quit date: 07/30/1972    Years since quitting: 50.6   Smokeless tobacco: Never   Tobacco comments:    smoked for only a few months  Vaping Use   Vaping status: Never Used  Substance and Sexual Activity   Alcohol use: Yes    Comment: OCCAS WINE    Drug use: No   Sexual activity: Never  Other Topics Concern   Not on file  Social History Narrative   Not on file   Social Determinants of Health   Financial Resource Strain: Low Risk  (04/20/2020)   Overall Financial Resource Strain (CARDIA)    Difficulty of Paying Living Expenses: Not hard at all  Food Insecurity: No Food Insecurity (04/20/2020)   Hunger Vital Sign    Worried About Running  Out of Food in the Last Year:  Never true    Ran Out of Food in the Last Year: Never true  Transportation Needs: No Transportation Needs (04/20/2020)   PRAPARE - Administrator, Civil Service (Medical): No    Lack of Transportation (Non-Medical): No  Physical Activity: Sufficiently Active (04/20/2020)   Exercise Vital Sign    Days of Exercise per Week: 4 days    Minutes of Exercise per Session: 60 min  Stress: No Stress Concern Present (04/20/2020)   [employer] of Occupational Health - Occupational Stress Questionnaire    Feeling of Stress : Not at all  Social Connections: Unknown (04/20/2020)   Social Connection and Isolation Panel [NHANES]    Frequency of Communication with Friends and Family: More than three times a week    Frequency of Social Gatherings with Friends and Family: Not on file    Attends Religious Services: Not on file    Active Member of Clubs or Organizations: Not on file    Attends Banker Meetings: Not on file    Marital Status: Not on file  Intimate Partner Violence: Not At Risk (04/20/2020)   Humiliation, Afraid, Rape, and Kick questionnaire    Fear of Current or Ex-Partner: No    Emotionally Abused: No    Physically Abused: No    Sexually Abused: No    Family History:    Family History  Problem Relation Age of Onset   Cancer Daughter        Breast   Breast cancer Daughter 36   Cancer Son      ROS:  Please see the history of present illness.  Review of Systems  Constitutional:  Positive for chills and malaise/fatigue.  Respiratory:  Positive for cough and shortness of breath.   Musculoskeletal:  Positive for falls and joint pain.  Neurological:  Positive for weakness.    All other ROS reviewed and negative.     Physical Exam/Data:   Vitals:   03/01/23 0832 03/01/23 0852 03/01/23 1000 03/01/23 1126  BP: (!) 91/43 (!) 109/55 (!) 117/54 (!) 129/58  Pulse: (!) 41 78 72 63  Resp: 17 (!) 21 20 17   Temp:   98.7 F (37.1  C) 98.5 F (36.9 C)  TempSrc:   Oral Oral  SpO2: 99% 98% 97% 95%  Weight:      Height:       No intake or output data in the 24 hours ending 03/01/23 1210    03/01/2023    7:14 AM 02/18/2023    2:33 PM 11/30/2022   10:31 AM  Last 3 Weights  Weight (lbs) 150 lb 150 lb 149 lb 12.8 oz  Weight (kg) 68.04 kg 68.04 kg 67.949 kg     Body mass index is 24.96 kg/m.  General:  Well-developed, in no acute distress HEENT: normal Neck: no JVD Vascular: No carotid bruits; Distal pulses 2+ bilaterally Cardiac:  normal S1, S2; RRR; bradycardic, II/VI murmur  Lungs:  clear upper lobes with diminished bases to auscultation bilaterally, no wheezing, rhonchi or rales, respirations are unlabored at rest on room air Abd: soft, nontender, no hepatomegaly  Ext: Trace pretibial edema Musculoskeletal:  No deformities, BUE and BLE strength normal and equal Skin: warm and dry  Neuro:  CNs 2-12 intact, no focal abnormalities noted Psych:  Normal affect   EKG:  The EKG was personally reviewed and demonstrates:  sinus tach, rate of 100, chronic LBBB,  Telemetry:  Telemetry was personally reviewed and demonstrates: Sinus with ventricular  bigeminy  Relevant CV Studies: Limited echo 12/07/2021: 1. Left ventricular ejection fraction, by estimation, is 60 to 65%. The  left ventricle has normal function. The left ventricle has no regional  wall motion abnormalities. There is moderat   2. There is moderate left ventricular hypertrophy with severe basal  septal hypertrophy. LVOT gradient noted, 39 mm Hg at rest, up to 52 mm Hg  with valsalva. One measurement estimating gradient at 127 mm Hg.   3. Right ventricular systolic function is normal. The right ventricular  size is normal. Tricuspid regurgitation signal is inadequate for assessing  PA pressure.   4. Left atrial size was mildly dilated.   5. The mitral valve is normal in structure. Moderate mitral valve  regurgitation. Severe mitral annular calcification.    6. The aortic valve is normal in structure. Aortic valve regurgitation is  not visualized. Aortic valve sclerosis/calcification is present, without  any evidence of aortic stenosis.   Comparison(s): 11/13/21 echo measured LVOT Vmax as 156 cm/s. __________   Eugenie Birks MPI 11/27/2021:   The study is normal. The study is low risk.   No ST deviation was noted.   LV perfusion is normal. There is no evidence of ischemia. There is no evidence of infarction.   Left ventricular function is normal. End diastolic cavity size is normal. End systolic cavity size is normal.   Suboptimal study due to GI uptake.   CT attenuation images with mild aortic and coronary calcification. __________   2D echo 11/13/2021:  1. Left ventricular ejection fraction, by estimation, is 50 to 55%. The  left ventricle has low normal function. The left ventricle has no regional  wall motion abnormalities. There is mild concentric left ventricular  hypertrophy. Left ventricular  diastolic parameters are consistent with Grade I diastolic dysfunction  (impaired relaxation).   2. Right ventricular systolic function is normal. The right ventricular  size is normal.   3. The mitral valve is myxomatous. No evidence of mitral valve  regurgitation. Mild mitral stenosis.   4. The aortic valve is normal in structure. Aortic valve regurgitation is  mild. __________   2D echo 06/26/2017: - Left ventricle: The cavity size was normal. Near cavity    obliteration in systole. There was mild focal basal and mild    concentric hypertrophy of the septum. Systolic function was    normal. The estimated ejection fraction was in the range of 55%    to 60%. Wall motion was normal; there were no regional wall    motion abnormalities. Doppler parameters are consistent with    abnormal left ventricular relaxation (grade 1 diastolic    dysfunction).  - Aortic valve: Transvalvular velocity was increased. There was    very mild stenosis.  -  Mitral valve: Calcified annulus. There was moderate    regurgitation.  - Left atrium: The atrium was mildly dilated.  - Right ventricle: Systolic function was normal.  - Pulmonary arteries: Systolic pressure was moderately elevated. PA    peak pressure: 50 mm Hg (S). __________  [facility] 06/12/2017: Coronary dominance: Right  Left mainstem:   Large vessel that bifurcates into the LAD and left circumflex, Ostial 30% disease, calcified.  Left anterior descending (LAD):   Large vessel that extends to the apical region, diagonal branch 2 of moderate size, mild lumenal irregularities  Left circumflex (LCx):  Large vessel with OM branch 2, moderate 50% mid vessel disease, calcified  Right coronary artery (RCA):  Right dominant vessel with PL and PDA,  moderate focal disease, eccentric 50 to 60%  Left ventriculography: Left ventricular systolic function is normal/hyperdynamic, LVEF is estimated at 55-65%, there is no significant mitral regurgitation , no significant aortic valve stenosis  Final Conclusions:   Moderate mid LCX and RCA disease, Medical management recommended Normal/hyperdynamic function  Recommendations:  Follow up on echocardiogram Start metoprolol tartrate 25 mg po BID __________   Eugenie Birks MPI 06/11/2017: Abnormal, potentially high risk myocardial perfusion stress test. There is a moderate in size, mild in severity, reversible defect involving the mid inferolateral, apical lateral, and apical segments consistent with ischemia. There is a small in size, mild in severity, reversible defect involving the mid anterior segment, which may represent subtle ischemia and/or shifting breast attenuation. The left ventricular ejection fraction is normal (64%). Transient ischemic dilation is noted (TID 1.31), which is non-specific but can be seen balanced/multivessel ischemia. __________   2D echo 12/05/2015: - Left ventricle: The cavity size was normal. There was mild focal    basal  and mild concentric hypertrophy of the septum, with mild    LVOT gradient. Systolic function was normal. The estimated    ejection fraction was in the range of 60% to 65%. Wall motion was    normal; there were no regional wall motion abnormalities. Doppler    parameters are consistent with abnormal left ventricular    relaxation (grade 1 diastolic dysfunction).  - Aorta: Aortic root with milldy dilated, dimension: 35 mm (ED).    Ascending aorta was mildly dilated, diameter: 35 mm (S).  - Mitral valve: There was mild regurgitation.  - Left atrium: The atrium was normal in size.  - Right ventricle: Systolic function was normal.  - Pulmonary arteries: Systolic pressure was within the normal    range.  - Inferior vena cava: The vessel was normal in size. The    respirophasic diameter changes were in the normal range (>= 50%),    consistent with normal central venous pressure.   Impressions:   - Murmur likely secondary to aortic valve sclerosis without    significant stenosis, and mild LVOT gradient.    Laboratory Data:  High Sensitivity Troponin:   Recent Labs  Lab 03/01/23 0752 03/01/23 1007  TROPONINIHS 122* 511*     Chemistry Recent Labs  Lab 03/01/23 0752  NA 133*  K 4.2  CL 102  CO2 20*  GLUCOSE 121*  BUN 19  CREATININE 0.90  CALCIUM 8.8*  GFRNONAA >60  ANIONGAP 11    Recent Labs  Lab 03/01/23 0752  PROT 6.6  ALBUMIN 3.6  AST 25  ALT 12  ALKPHOS 38  BILITOT 1.8*   Lipids No results for input(s): "CHOL", "TRIG", "HDL", "LABVLDL", "LDLCALC", "CHOLHDL" in the last 168 hours.  Hematology Recent Labs  Lab 03/01/23 0752  WBC 21.2*  RBC 4.18  HGB 11.7*  HCT 35.5*  MCV 84.9  MCH 28.0  MCHC 33.0  RDW 15.2  PLT 179   Thyroid No results for input(s): "TSH", "FREET4" in the last 168 hours.  BNPNo results for input(s): "BNP", "PROBNP" in the last 168 hours.  DDimer No results for input(s): "DDIMER" in the last 168 hours.   Radiology/Studies:  DG Knee  Complete 4 Views Left  Result Date: 03/01/2023 CLINICAL DATA:  left knee pain, fall eval for fracture EXAM: LEFT KNEE - COMPLETE 4+ VIEW COMPARISON:  None Available. FINDINGS: No acute fracture or dislocation. No aggressive osseous lesion. There are degenerative changes of the knee joint in the form of mildly  reduced lateral tibio-femoral compartment joint space, tibial spiking and tricompartmental osteophytosis. No knee effusion or focal soft tissue swelling. No radiopaque foreign bodies. IMPRESSION: Negative. Electronically Signed   By: Jules Schick M.D.   On: 03/01/2023 09:49   DG Chest Port 1 View  Result Date: 03/01/2023 CLINICAL DATA:  Chills, shortness of breath and sepsis. EXAM: PORTABLE CHEST 1 VIEW COMPARISON:  11/04/2022 FINDINGS: Stable mild cardiac enlargement. Stable mild pulmonary vascular prominence which may reflect mild pulmonary venous hypertension. No overt pulmonary edema, airspace consolidation, pleural fluid or pneumothorax identified. Stable partially visualized left shoulder hemiarthroplasty. IMPRESSION: Stable mild cardiac enlargement and mild pulmonary vascular prominence which may reflect mild pulmonary venous hypertension. Electronically Signed   By: Irish Lack M.D.   On: 03/01/2023 08:17     Assessment and Plan:   Elevated high-sensitivity troponin -Patient denies any chest discomfort -High-sensitivity troponins trended 122 and 511 -Likely supply/demand mismatch in the setting of urinary tract infection she presented with diaphoresis, shivering, chills, fever, weakness, and leukocytosis -No current need for heparin infusion -No plan for ischemic workup at this time -No EKG changes -Continue on aspirin and beta-blocker therapy  Urinary tract infection -Hypotensive on arrival that improved with fluid resuscitation and antibiotics -Blood pressure remained stable -Continued on antibiotics with urine culture pending -Blood cultures pending -Daily CBC to trend  WBC current WBC greater than 21,000 -If blood pressure remains stable would recommend discontinuing IV fluids -Continued management per IM  Chronic HFpEF with complaints of shortness of breath -Shortness of breath with nonproductive cough on presentation -Currently maintaining oxygen saturations on room air -Given one-time dose of furosemide 20 mg IVP per primary team -If blood pressure remains stable hold IV fluids and would not recommend giving further doses of diuretic -BNP 1726.1 -Symptoms likely exacerbated by acute infectious process -Daily weights, I's and O's, low-sodium diet -High risk for exacerbation in the setting of LVH with LVOT gradient  Renal artery/Carotid stenosis -History of renal artery stenosis with prior stenting on the right in June 2020 -Carotid stenting to the left in March 2022 -Continue on aspirin and statin therapy  Abnormal EKG with frequent PVCs -PVCs improving on telemetry -Previously been in ventricular bigeminy -Continue low-dose bisoprolol as blood pressure tolerates  HOCM with LBBB -Previous intolerance to carvedilol, metoprolol, and diltiazem secondary to myalgias and fatigue -Typically runs bradycardic in the setting of underlying hokum -Left bundle branch block is been chronic -Recommend continuing buspirone 2.5 mg daily -Given advanced age and comorbid condition, continue with conservative approach  Hyperlipidemia -Continue on statin therapy  Hypothyroidism -Continued on Synthroid  Risk Assessment/Risk Scores:        For questions or updates, please contact [facility] Please consult [email or web address] for contact info under    Signed,  , NP  03/01/2023 12:10 PM

## 2023-03-01 NOTE — Assessment & Plan Note (Signed)
Continue Synthroid °

## 2023-03-01 NOTE — Assessment & Plan Note (Addendum)
2D echo May 2023 with a EF of 60 to 65% Noted left ventricular hypertrophy BNP pending Positive shortness of breath Chest ray concerning for cardiomegaly and interstitial edema Low-dose Lasix x 1 Trend urine output Follow-up cardiology recommendations

## 2023-03-01 NOTE — Assessment & Plan Note (Signed)
Positive generalized malaise, fatigue over the past 24 hours White count 21.2 Urinalysis indicative of infection IV Rocephin for infectious coverage Urine culture Follow

## 2023-03-01 NOTE — ED Notes (Addendum)
Delay in getting lactic acid due to waiting on IV fluids to be completed.

## 2023-03-01 NOTE — Assessment & Plan Note (Signed)
Continue beta blocker. 

## 2023-03-01 NOTE — Assessment & Plan Note (Signed)
status post stenting 80% occluded R renal artery 12/2018

## 2023-03-01 NOTE — Assessment & Plan Note (Signed)
PPI ?

## 2023-03-01 NOTE — ED Notes (Signed)
Delay in collected labs due to starting an IV. (Pt is a hard stick)

## 2023-03-01 NOTE — H&P (Addendum)
History and Physical    Patient: Kathryn Wise WUJ:[id number] DOB: 01/21/1930 DOA: 03/01/2023 DOS: the patient was seen and examined on 03/01/2023 PCP: Enid Baas, MD  Patient coming from: Home  Chief Complaint:  Chief Complaint  Patient presents with   Shortness of Breath   Chills   HPI: Kathryn Wise is a 89 y.o. female with medical history significant of nonobstructive coronary artery disease, concentric LVH/HOCM, labile hypertension, left renal artery stenosis, s/p stenting in June 2020, carotid artery stenosis and stenting on the left side in March 2022, left bundle branch block presenting with UTI, NSTEMI, acute HFpEF.  Patient reports generalized malaise, fatigue, shortness of breath over the past 24 hours.  Mild nausea.  No reported abdominal pain diarrhea.  No dysuria though there has been no change in urinary morphology per the patient and granddaughter at the bedside.  Worsening shortness of breath.  No chest pains reported.  No hemiparesis or confusion.  Patient does report falling roughly 1 week ago.  Has stable nasal fracture that is followed by ENT outpatient currently Presented to the ER afebrile, hemodynamically stable.  Satting well on room air.  White count 21.2, hemoglobin 11.7, platelets 179, creatinine 0.9.  Troponin 122.  Urinalysis indicative of infection.  EKG sinus tach with bigeminy.  Chest x-ray with mild cardiac enlargement and pulmonary vascular congestion.  Right knee plain films within normal limits. Review of Systems: As mentioned in the history of present illness. All other systems reviewed and are negative. Past Medical History:  Diagnosis Date   Arthritis    Collagen vascular disease (HCC)    Dysphagia, pharyngoesophageal phase 09/10/2013   Upper GI study with barium swallow was done at  [facility] Mar 2015   No reflux seen  Small irreducible hiatal hernia  Mild changes of presbyeophagus (abnormal contractions of the esophagus that occur with aging) No  strictures Normal gastric emptying  Incomplete visualization of stomach fold due to patient's inability turn     GERD (gastroesophageal reflux disease)    Heart murmur    has had years and years   History of shingles Dec 2013   treated with steroids , post op from shoulder surgery   History of squamous cell carcinoma 05/02/2016   right mid lateral pretibial   Hypertension    Hypothyroidism    Major depressive disorder, single episode 11/05/2015   Osteoarthritis of left shoulder 07/14/2012   Pneumonia 10/21/2020   Squamous cell carcinoma of skin 05/12/2016   R mid lat pretibial - other skin cancers treated by Dr. Jarold Motto   Squamous cell carcinoma of skin 08/22/2020   left distal medial popliteal - [facility]   Past Surgical History:  Procedure Laterality Date   ARTHOSCOPIC ROTAOR CUFF REPAIR     LEFT  10+  YEARS     CAROTID PTA/STENT INTERVENTION Left 10/19/2020   Procedure: CAROTID PTA/STENT INTERVENTION;  Surgeon: Renford Dills, MD;  Location: [facility] INVASIVE [facility];  Service: Cardiovascular;  Laterality: Left;   CATARACT EXTRACTION W/ INTRAOCULAR LENS IMPLANT     RIGHT EYE   CATARACT EXTRACTION W/PHACO Left 03/28/2016   Procedure: CATARACT EXTRACTION PHACO AND INTRAOCULAR LENS PLACEMENT (IOC);  Surgeon: Lockie Mola, MD;  Location: [facility];  Service: Ophthalmology;  Laterality: Left;  TORIC   FACELIFT     FOOT SURGERY     rt foot   TUMOR REMOVED   JOINT REPLACEMENT Left Dec 2013   shoulder   LEFT HEART CATH AND CORONARY ANGIOGRAPHY  N/A 06/12/2017   Procedure: LEFT HEART CATH AND CORONARY ANGIOGRAPHY;  Surgeon: Antonieta Iba, MD;  Location: [facility] INVASIVE CV LAB;  Service: Cardiovascular;  Laterality: N/A;   RENAL ANGIOGRAPHY Left 01/07/2019   Procedure: RENAL ANGIOGRAPHY;  Surgeon: Renford Dills, MD;  Location: [facility] INVASIVE CV [facility];  Service: Cardiovascular;  Laterality: Left;   RENAL ARTERY STENT     SKIN CANCER EXCISION     TOTAL SHOULDER ARTHROPLASTY   07/14/2012   Procedure: TOTAL SHOULDER ARTHROPLASTY;  Surgeon: Eulas Post, MD;  Location: [facility] OR;  Service: Orthopedics;  Laterality: Left;   Social History:  reports that she quit smoking about 50 years ago. Her smoking use included cigarettes. She has never used smokeless tobacco. She reports current alcohol use. She reports that she does not use drugs.  Allergies  Allergen Reactions   Alendronate Swelling    And nausea     Codeine    Lisinopril     Spike in BP   Metoprolol Other (See Comments)    Lethargy - PT CURRENTLY TAKING    Oxycodone     Other reaction(s): Other (See Comments) Altered mental status   Oxycontin [Oxycodone Hcl]     Altered mental status    Family History  Problem Relation Age of Onset   Cancer Daughter        Breast   Breast cancer Daughter 39   Cancer Son     Prior to Admission medications   Medication Sig Start Date End Date Taking? Authorizing Provider  aspirin EC 81 MG tablet Take 81 mg by mouth daily.   Yes [provider]  Cholecalciferol (VITAMIN D-3) 1000 units CAPS Take 1,000 Units by mouth daily.   Yes [provider]  cyanocobalamin (VITAMIN B12) 1000 MCG tablet Take 1,000 mcg by mouth daily.   Yes [provider]  levothyroxine (SYNTHROID) 75 MCG tablet Take 75 mcg by mouth daily before breakfast. 10/12/22  Yes [provider]  omeprazole (PRILOSEC) 20 MG capsule TAKE 1 CAPSULE(20 MG) BY MOUTH EVERY MORNING AS NEEDED FOR HEARTBURN 06/13/22  Yes Sherlene Shams, MD  rosuvastatin (CRESTOR) 10 MG tablet Take 1 tablet (10 mg total) by mouth at bedtime. 03/12/22  Yes Sherlene Shams, MD  temazepam (RESTORIL) 15 MG capsule TAKE 1 CAPSULE BY MOUTH AT BEDTIME AS NEEDED SLEEP 03/05/22  Yes Sherlene Shams, MD  amLODipine (NORVASC) 5 MG tablet Take 1 tablet (5 mg total) by mouth as needed (Once daily AS NEEDED for SBP (top blood pressure number) is greater than 160.). 11/30/22 02/28/23  Sondra Barges, PA-C  bisoprolol  (ZEBETA) 5 MG tablet Take 0.5 tablet (2.5 mg) by mouth once daily Patient taking differently: Take 5 mg by mouth daily. 08/24/22   Dunn, Raymon Mutton, PA-C  levothyroxine (SYNTHROID) 88 MCG tablet TAKE 1 TABLET(88 MCG) BY MOUTH DAILY BEFORE BREAKFAST Patient not taking: Reported on 11/04/2022 03/10/22   Eulis Foster, FNP  nitroGLYCERIN (NITROSTAT) 0.4 MG SL tablet Place 1 tablet (0.4 mg total) under the tongue every 5 (five) minutes as needed for chest pain. 08/18/22   Willeen Niece, MD  sucralfate (CARAFATE) 1 g tablet Take 1 tablet (1 g total) by mouth 4 (four) times daily. 11/04/22 11/04/23  Phineas Semen, MD  Turmeric 500 MG CAPS Take 500 mg by mouth daily.    [provider]    Physical Exam: Vitals:   03/01/23 0832 03/01/23 0852 03/01/23 1000 03/01/23 1126  BP: (!) 91/43 [name])  109/55 (!) 117/54 (!) 129/58  Pulse: (!) 41 78 72 63  Resp: 17 (!) 21 20 17   Temp:   98.7 F (37.1 C) 98.5 F (36.9 C)  TempSrc:   Oral Oral  SpO2: 99% 98% 97% 95%  Weight:      Height:       Physical Exam Constitutional:      Appearance: She is normal weight.  HENT:     Head: Normocephalic and atraumatic.     Nose: Nose normal.     Mouth/Throat:     Mouth: Mucous membranes are moist.  Eyes:     Pupils: Pupils are equal, round, and reactive to light.  Cardiovascular:     Rate and Rhythm: Normal rate and regular rhythm.  Pulmonary:     Effort: Pulmonary effort is normal.  Abdominal:     General: Bowel sounds are normal.  Musculoskeletal:        General: Normal range of motion.  Skin:    General: Skin is warm.  Neurological:     General: No focal deficit present.  Psychiatric:        Mood and Affect: Mood normal.     Data Reviewed:  There are no new results to review at this time. DG Knee Complete 4 Views Left CLINICAL DATA:  left knee pain, fall eval for fracture  EXAM: LEFT KNEE - COMPLETE 4+ VIEW  COMPARISON:  None Available.  FINDINGS: No acute fracture or dislocation.  No  aggressive osseous lesion.  There are degenerative changes of the knee joint in the form of mildly reduced lateral tibio-femoral compartment joint space, tibial spiking and tricompartmental osteophytosis.  No knee effusion or focal soft tissue swelling.  No radiopaque foreign bodies.  IMPRESSION: Negative.  Electronically Signed   By: Jules Schick M.D.   On: 03/01/2023 09:49 DG Chest Port 1 View CLINICAL DATA:  Chills, shortness of breath and sepsis.  EXAM: PORTABLE CHEST 1 VIEW  COMPARISON:  11/04/2022  FINDINGS: Stable mild cardiac enlargement. Stable mild pulmonary vascular prominence which may reflect mild pulmonary venous hypertension. No overt pulmonary edema, airspace consolidation, pleural fluid or pneumothorax identified. Stable partially visualized left shoulder hemiarthroplasty.  IMPRESSION: Stable mild cardiac enlargement and mild pulmonary vascular prominence which may reflect mild pulmonary venous hypertension.  Electronically Signed   By: Irish Lack M.D.   On: 03/01/2023 08:17  Lab Results  Component Value Date   WBC 21.2 (H) 03/01/2023   HGB 11.7 (L) 03/01/2023   HCT 35.5 (L) 03/01/2023   MCV 84.9 03/01/2023   PLT 179 03/01/2023   Last metabolic panel Lab Results  Component Value Date   GLUCOSE 121 (H) 03/01/2023   NA 133 (L) 03/01/2023   K 4.2 03/01/2023   CL 102 03/01/2023   CO2 20 (L) 03/01/2023   BUN 19 03/01/2023   CREATININE 0.90 03/01/2023   GFRNONAA >60 03/01/2023   CALCIUM 8.8 (L) 03/01/2023   PHOS 4.2 04/26/2021   PROT 6.6 03/01/2023   ALBUMIN 3.6 03/01/2023   BILITOT 1.8 (H) 03/01/2023   ALKPHOS 38 03/01/2023   AST 25 03/01/2023   ALT 12 03/01/2023   ANIONGAP 11 03/01/2023    Assessment and Plan: * UTI (urinary tract infection) Positive generalized malaise, fatigue over the past 24 hours White count 21.2 Urinalysis indicative of infection IV Rocephin for infectious coverage Urine culture Follow  NSTEMI  (non-ST elevated myocardial infarction) (HCC) Troponin 120s in the setting of active UTI Positive shortness of breath with  concern for volume overload on chest x-ray Noted baseline HOCM S/p full dose ASA  Suspect mild demand ischemia Will trend troponin F/u cardiology recommendations  Acute heart failure with preserved ejection fraction (HFpEF) (HCC) 2D echo May 2023 with a EF of 60 to 65% Noted left ventricular hypertrophy BNP pending Positive shortness of breath Chest ray concerning for cardiomegaly and interstitial edema Low-dose Lasix x 1 Trend urine output Follow-up cardiology recommendations  Hypothyroidism Continue Synthroid  Leukocytosis White count 20 in the setting of active urinary tract infection No overt indication for sepsis at present is vital signs are relatively stable Lactate negative x 2 IV Rocephin for urinary coverage Pancultured in the ER Will otherwise trend and monitor  HOCM (hypertrophic obstructive cardiomyopathy) (HCC) Continue beta-blocker  Paroxysmal tachycardia (HCC) Continue beta-blocker  Carotid stenosis status post left sided stenting in 09/2020   Renal artery stenosis (HCC) status post stenting 80% occluded R renal artery 12/2018   Hyperlipidemia LDL goal <130 Continue statin  GERD (gastroesophageal reflux disease) PPI      Advance Care Planning:   Code Status: Full Code   Consults: Cardiology   Family Communication:Granddaughter at the bedside   Severity of Illness: The appropriate patient status for this patient is INPATIENT. Inpatient status is judged to be reasonable and necessary in order to provide the required intensity of service to ensure the patient's safety. The patient's presenting symptoms, physical exam findings, and initial radiographic and laboratory data in the context of their chronic comorbidities is felt to place them at high risk for further clinical deterioration. Furthermore, it is not anticipated that  the patient will be medically stable for discharge from the hospital within 2 midnights of admission.   * I certify that at the point of admission it is my clinical judgment that the patient will require inpatient hospital care spanning beyond 2 midnights from the point of admission due to high intensity of service, high risk for further deterioration and high frequency of surveillance required.*  Author: Floydene Flock, MD 03/01/2023 12:02 PM  For on call review [email or web address].

## 2023-03-01 NOTE — ED Provider Notes (Addendum)
[facility] Provider Note    Event Date/Time   First MD Initiated Contact with Patient 03/01/23 [phone]     (approximate)   History   Shortness of Breath and Chills   HPI  Kathryn Wise is a 89 y.o. female presents from home with chief complaint of generalized malaise chills shortness of breath and weakness starting yesterday evening lasting through the night.  She denies any pain.  No nausea or vomiting.     Physical Exam   Triage Vital Signs: ED Triage Vitals  Encounter Vitals Group     BP      Systolic BP Percentile      Diastolic BP Percentile      Pulse      Resp      Temp      Temp src      SpO2      Weight      Height      Head Circumference      Peak Flow      Pain Score      Pain Loc      Pain Education      Exclude from Growth Chart     Most recent vital signs: Vitals:   03/01/23 0832 03/01/23 0852  BP: (!) 91/43 (!) 109/55  Pulse: (!) 41 78  Resp: 17 (!) 21  Temp:    SpO2: 99% 98%     Constitutional: Alert  Eyes: Conjunctivae are normal.  Head: Atraumatic. Nose: No congestion/rhinnorhea. Mouth/Throat: Mucous membranes are moist.   Neck: Painless ROM.  Cardiovascular:   Good peripheral circulation. Respiratory: Normal respiratory effort.  No retractions.  Gastrointestinal: Soft and nontender.  Musculoskeletal:  no deformity Neurologic:  MAE spontaneously. No gross focal neurologic deficits are appreciated.  Skin:  Skin is warm, dry and intact. No rash noted. Psychiatric: Mood and affect are normal. Speech and behavior are normal.    ED Results / Procedures / Treatments   Labs (all labs ordered are listed, but only abnormal results are displayed) Labs Reviewed  COMPREHENSIVE METABOLIC PANEL - Abnormal; Notable for the following components:      Result Value   Sodium 133 (*)    CO2 20 (*)    Glucose, Bld 121 (*)    Calcium 8.8 (*)    Total Bilirubin 1.8 (*)    All other components within normal limits   CBC WITH DIFFERENTIAL/PLATELET - Abnormal; Notable for the following components:   WBC 21.2 (*)    Hemoglobin 11.7 (*)    HCT 35.5 (*)    Neutro Abs 17.7 (*)    Monocytes Absolute 1.3 (*)    Abs Immature Granulocytes 0.13 (*)    All other components within normal limits  URINALYSIS, W/ REFLEX TO CULTURE (INFECTION SUSPECTED) - Abnormal; Notable for the following components:   Color, Urine YELLOW (*)    APPearance CLOUDY (*)    Hgb urine dipstick SMALL (*)    Ketones, ur 5 (*)    Protein, ur 30 (*)    Nitrite POSITIVE (*)    Leukocytes,Ua MODERATE (*)    Bacteria, UA MANY (*)    All other components within normal limits  TROPONIN I (HIGH SENSITIVITY) - Abnormal; Notable for the following components:   Troponin I (High Sensitivity) 122 (*)    All other components within normal limits  CULTURE, BLOOD (ROUTINE X 2)  CULTURE, BLOOD (ROUTINE X 2)  SARS CORONAVIRUS 2 BY RT PCR  URINE CULTURE  LACTIC ACID, PLASMA  LACTIC ACID, PLASMA  TROPONIN I (HIGH SENSITIVITY)     EKG  ED ECG REPORT I, Willy Eddy, the attending physician, personally viewed and interpreted this ECG.   Date: 03/01/2023  EKG Time: 7:11  Rate: 100  Rhythm: bigeminy  Axis: normal  Intervals: left  ST&T Change:  no stemi, no depresion    RADIOLOGY Please see ED Course for my review and interpretation.  I personally reviewed all radiographic images ordered to evaluate for the above acute complaints and reviewed radiology reports and findings.  These findings were personally discussed with the patient.  Please see medical record for radiology report.    PROCEDURES:  Critical Care performed: No  Procedures   MEDICATIONS ORDERED IN ED: Medications  sodium chloride 0.9 % bolus 1,000 mL (0 mLs Intravenous Stopped 03/01/23 1000)  aspirin chewable tablet 324 mg (has no administration in time range)  lactated ringers infusion (has no administration in time range)  cefTRIAXone (ROCEPHIN) 2 g in  sodium chloride 0.9 % 100 mL IVPB (has no administration in time range)     IMPRESSION / MDM / ASSESSMENT AND PLAN / ED COURSE  I reviewed the triage vital signs and the nursing notes.                              Differential diagnosis includes, but is not limited to, sepsis, COVID, COPD, CHF, pneumonia, UTI, electrolyte abnormality, acs  Patient presenting to the ER for evaluation of symptoms as described above.  Based on symptoms, risk factors and considered above differential, this presenting complaint could reflect a potentially life-threatening illness therefore the patient will be placed on continuous pulse oximetry and telemetry for monitoring.  Laboratory evaluation will be sent to evaluate for the above complaints.      Clinical Course as of 03/01/23 0954  Fri Mar 01, 2023  8295 COVID test is negative.  Chest x-ray on my review and interpretation with out evidence of consolidation. [PR]  0842 Lactate normal but significant leukocytosis. [PR]  [phone] UA with many bacteria nitrite positive UTI in the setting of her age borderline low blood pressures leukocytosis and symptoms do feel that she will require hospitalization for IV antibiotics IV fluids and further monitoring.  I suspect that the troponin elevation is demand ischemia in the setting of infection.  Will consult hospitalist for admission. [PR]    Clinical Course User Index [PR] Willy Eddy, MD     FINAL CLINICAL IMPRESSION(S) / ED DIAGNOSES   Final diagnoses:  Acute cystitis without hematuria  Rigors     Rx / DC Orders   ED Discharge Orders     None        Note:  This document was prepared using Dragon voice recognition software and may include unintentional dictation errors.    Willy Eddy, MD 03/01/23 0865    Willy Eddy, MD 03/01/23 [phone]

## 2023-03-01 NOTE — Assessment & Plan Note (Signed)
status post left sided stenting in 09/2020

## 2023-03-01 NOTE — ED Triage Notes (Signed)
Pt comes by EMS from home for chills and SOB. Pt woke up sweating and chills. Pt denies any pain. Pt did have a fall 2 wks ago and was clear by a hospital.

## 2023-03-01 NOTE — Assessment & Plan Note (Addendum)
Troponin 120s in the setting of active UTI Positive shortness of breath with concern for volume overload on chest x-ray Noted baseline HOCM S/p full dose ASA  Suspect mild demand ischemia Will trend troponin F/u cardiology recommendations

## 2023-03-01 NOTE — Assessment & Plan Note (Signed)
White count 20 in the setting of active urinary tract infection No overt indication for sepsis at present is vital signs are relatively stable Lactate negative x 2 IV Rocephin for urinary coverage Pancultured in the ER Will otherwise trend and monitor

## 2023-03-01 NOTE — ED Notes (Signed)
Pt was straight cathed with a small female catheter kit. Pt tolerated well and u/a collected at this time.

## 2023-03-01 NOTE — Assessment & Plan Note (Deleted)
status post left sided stenting in 09/2020

## 2023-03-01 NOTE — ED Notes (Signed)
All septic workup that has been ordered, is completed thus far.   -EKG -BC x 2 -2 Lactic's - CBC - CMP - 2 Troponin's - U/a

## 2023-03-02 ENCOUNTER — Inpatient Hospital Stay: Payer: Medicare Other

## 2023-03-02 DIAGNOSIS — N10 Acute tubulo-interstitial nephritis: Secondary | ICD-10-CM | POA: Diagnosis not present

## 2023-03-02 DIAGNOSIS — I421 Obstructive hypertrophic cardiomyopathy: Secondary | ICD-10-CM

## 2023-03-02 DIAGNOSIS — R7989 Other specified abnormal findings of blood chemistry: Secondary | ICD-10-CM

## 2023-03-02 LAB — MAGNESIUM: Magnesium: 2.3 mg/dL (ref 1.7–2.4)

## 2023-03-02 LAB — T4, FREE: Free T4: 1.09 ng/dL (ref 0.61–1.12)

## 2023-03-02 LAB — TSH: TSH: 7.416 u[IU]/mL — ABNORMAL HIGH (ref 0.350–4.500)

## 2023-03-02 MED ORDER — POTASSIUM CHLORIDE CRYS ER 20 MEQ PO TBCR
20.0000 meq | EXTENDED_RELEASE_TABLET | Freq: Once | ORAL | Status: AC
  Administered 2023-03-02: 20 meq via ORAL
  Filled 2023-03-02: qty 1

## 2023-03-02 MED ORDER — IOHEXOL 300 MG/ML  SOLN
100.0000 mL | Freq: Once | INTRAMUSCULAR | Status: AC | PRN
  Administered 2023-03-02: 100 mL via INTRAVENOUS

## 2023-03-02 NOTE — Progress Notes (Signed)
Physical Therapy Evaluation Patient Details Name: Kathryn Wise MRN: [id number] DOB: 03-12-1930 Today's Date: 03/02/2023  History of Present Illness  Kathryn Wise is a 89 y.o. female presents from home with chief complaint of chills, SOB and weakness. PMH significant for CAD, Carotid Artery Stenosis, HTN. Per chart, urinalysis indicative of infection.    Clinical Impression  PT received patient in handoff from OT, patient agreeable to mobility this date. At baseline, patient reports being Mod I with Mobility and ADLs, and still drives. Patient lives alone with family nearby. Patient able to complete  all transfers and ambulation approx 200 ft with Mod I and no AD today, mild antalgic gait pattern due to knee pain. Completed stair negotiation, with patient able to complete with supervision for safety. Patient is at functional baseline and there is no need for skilled acute PT services at this time. Patient left seated EOB with all needs in reach and family at bedside. Will d/c in house.       If plan is discharge home, recommend the following:     Can travel by private vehicle        Equipment Recommendations None recommended by PT  Recommendations for Other Services       Functional Status Assessment Patient has not had a recent decline in their functional status     Precautions / Restrictions Precautions Precautions: None Restrictions Weight Bearing Restrictions: No      Mobility  Bed Mobility               General bed mobility comments: Not observed    Transfers Overall transfer level: Modified independent Equipment used: None               General transfer comment: able to stand from bed height Mod I, no use of AD and no imbalance    Ambulation/Gait Ambulation/Gait assistance: Modified independent (Device/Increase time) Gait Distance (Feet): 200 Feet Assistive device: None Gait Pattern/deviations: WFL(Within Functional Limits) (very mild antalgic  gait pattern due to knee pain)       General Gait Details: patient able to ambulate approx 200 ft without AD and Mod I. No evidence of imbalance noted.  Stairs Stairs: Yes Stairs assistance: Supervision Stair Management: No rails Number of Stairs: 3 General stair comments: able to ascend/descend stairs with supervision; cues for sequencing to help reduce knee pain.  Wheelchair Mobility     Tilt Bed    Modified Rankin (Stroke Patients Only)       Balance Overall balance assessment: Modified Independent                                           Pertinent Vitals/Pain Pain Assessment Pain Assessment: 0-10 Pain Score: 5  Pain Location: L Knee Pain Descriptors / Indicators: Aching Pain Intervention(s): Monitored during session, Limited activity within patient's tolerance    Home Living Family/patient expects to be discharged to:: Private residence Living Arrangements: Alone Available Help at Discharge: Family Type of Home: House Home Access: Stairs to enter Entrance Stairs-Rails: None Entrance Stairs-Number of Steps: 3 (front entrance; side entrance has 1 step)   Home Layout: One level Home Equipment: Grab bars - toilet;Grab bars - tub/shower;Shower seat      Prior Function Prior Level of Function : Independent/Modified Independent;History of Falls (last six months);Driving  Mobility Comments: IND with ambulation, does report 1 fall in last 6 months (reports she caught her toe on accident) ADLs Comments: IND with ADLs     Hand Dominance   Dominant Hand: Right    Extremity/Trunk Assessment        Lower Extremity Assessment Lower Extremity Assessment: Overall WFL for tasks assessed       Communication   Communication: No difficulties  Cognition Arousal/Alertness: Awake/alert Behavior During Therapy: WFL for tasks assessed/performed Overall Cognitive Status: Within Functional Limits for tasks assessed                                           General Comments      Exercises     Assessment/Plan    PT Assessment Patient does not need any further PT services  PT Problem List Pain;Decreased balance       PT Treatment Interventions      PT Goals (Current goals can be found in the Care Plan section)       Frequency       Co-evaluation               AM-PAC PT "6 Clicks" Mobility  Outcome Measure Help needed turning from your back to your side while in a flat bed without using bedrails?: None Help needed moving from lying on your back to sitting on the side of a flat bed without using bedrails?: None Help needed moving to and from a bed to a chair (including a wheelchair)?: None Help needed standing up from a chair using your arms (e.g., wheelchair or bedside chair)?: None Help needed to walk in hospital room?: None Help needed climbing 3-5 steps with a railing? : None 6 Click Score: 24    End of Session Equipment Utilized During Treatment: Gait belt Activity Tolerance: Patient tolerated treatment well Patient left: in bed;with family/visitor present Nurse Communication: Mobility status PT Visit Diagnosis: History of falling (Z91.81)    Time: 1610-9604 PT Time Calculation (min) (ACUTE ONLY): 8 min   Charges:   PT Evaluation $PT Eval Low Complexity: 1 Low   PT General Charges $$ ACUTE PT VISIT: 1 Visit        Kathryn Wise, PT, DPT 03/02/23 1:30 PM

## 2023-03-02 NOTE — Progress Notes (Signed)
Progress Note   Patient: Kathryn Wise ZOX:[id number] DOB: 20-Aug-1929 DOA: 03/01/2023     1 DOS: the patient was seen and examined on 03/02/2023   Brief hospital course: 89 year old female with PMH of Chronic Hypertension with LVH and HOCM, nonobstructive CAD, left renal artery stenosis s/p 2020 stent placement who presented to the ED on 8/2 with fevers chills found to have E. Coli UTI with left pyelonephritis.  CT Scan reports "Mild fullness in the left renal pelvis and proximal left ureter with subtle peripelvic and periureteric edema. Possible segmental decreased perfusion to the lower pole left kidney. Imaging features are nonspecific but can be seen in the setting of pyelonephritis."  Assessment and Plan:  Left Pyelonephritis with E. Coli UTI: - This is Day 2 of IV Ceftriaxone.  Urine Culture is growing E. Coli.  Susceptibilities are pending.   - Patient will need 10 day course.  Can discharge home tomorrow on Cefdinir 300 mg BID or other antibiotic based on susceptibilities.  Chronic Grade 1 Diastolic Heart Failure with EF 50-55% / HOCM / Moderate Mitral Regurgitation: - S/p fluids x 24 hours.  Euvolemic.  Avoid any more fluids.  Frequent PVCs: - Bisoprolol was increased to 5 mg daily.  Appreciate Cardiology.  Carotid Artery Stenosis s/p 09/2020 stent: - Aspirin and Statin.  Renal Artery Stenosis s/p 12/2018 stent: - Aspirin and Statin.  Hypertension: Stable. - Monitor.  Hyperlipidemia: - Statin.  Hypothyroidism: - Synthroid.  GERD: - PPI.  Moderate Hiatal Hernia: - Asymptomatic.  Aortic Atherosclerosis: - Aspirin and Statin.  Pancreatic Cyst: CT reports "12 mm low-density lesion in the head of the pancreas" reported on CT Scan.  This is described as most likely a benign cyst. - Monitor outpatient.  Moderate Hiatal Hernia: Aortic Atherosclerosis:  Diet: Cardiac DVT: Lovenox Dispo: Floors Discharge Plan: Patient was seen by PT and cleared for discharge.   Discharge home tomorrow on antibiotics.  Follow up with Cardiologist Dr. Roanna Epley outpatient.  Subjective:  Kathryn Wise is resting comfortably today - feeling much better than yesterday. She denies any fevers chills or dysuria. She denies any chest pain or shortness of breath. She worked well with physical therapy today. Family is at bedside.  Plan of care was discussed.   Physical Exam: Vitals:   03/01/23 1559 03/01/23 2350 03/02/23 0817 03/02/23 1528  BP: 116/86 (!) 106/54 114/62 124/87  Pulse: 71 66 70 67  Resp: 18 17 16 18   Temp: 99.2 F (37.3 C) 98.8 F (37.1 C) 98.3 F (36.8 C) 98.2 F (36.8 C)  TempSrc: Oral Oral Oral Oral  SpO2: 95% 92% 94% 97%  Weight:      Height:       Physical Exam Constitutional:      General: She is not in acute distress.    Comments: Multiple bruises on face from recent fall  HENT:     Head: Normocephalic and atraumatic.  Eyes:     Extraocular Movements: Extraocular movements intact.     Pupils: Pupils are equal, round, and reactive to light.  Cardiovascular:     Rate and Rhythm: Normal rate and regular rhythm.     Heart sounds: Murmur heard.  Pulmonary:     Effort: Pulmonary effort is normal.     Breath sounds: Normal breath sounds.  Abdominal:     Palpations: Abdomen is soft.     Tenderness: There is no abdominal tenderness.  Musculoskeletal:     Cervical back: Normal range of motion and  neck supple.  Skin:    General: Skin is warm and dry.     Capillary Refill: Capillary refill takes less than 2 seconds.  Neurological:     General: No focal deficit present.     Data Reviewed: Lab Results  Component Value Date   WBC 14.0 (H) 03/02/2023   HGB 10.3 (L) 03/02/2023   HCT 31.9 (L) 03/02/2023   MCV 86.0 03/02/2023   PLT 153 03/02/2023   Last metabolic panel Lab Results  Component Value Date   GLUCOSE 95 03/02/2023   NA 133 (L) 03/02/2023   K 3.5 03/02/2023   CL 105 03/02/2023   CO2 22 03/02/2023   BUN 18 03/02/2023    CREATININE 0.91 03/02/2023   GFRNONAA 60 (L) 03/02/2023   CALCIUM 8.1 (L) 03/02/2023   PHOS 4.2 04/26/2021   PROT 6.0 (L) 03/02/2023   ALBUMIN 3.0 (L) 03/02/2023   BILITOT 1.0 03/02/2023   ALKPHOS 35 (L) 03/02/2023   AST 25 03/02/2023   ALT 11 03/02/2023   ANIONGAP 6 03/02/2023     Family Communication: Discussed with family at bedside.  Disposition: Status is: Inpatient Remains inpatient appropriate because: needs IV antibiotics.  Planned Discharge Destination: Home    Time spent: >60 minutes  Author: Baldwin Jamaica, MD 03/02/2023 8:07 PM  For on call review [email or web address].

## 2023-03-02 NOTE — Plan of Care (Signed)
  Problem: Clinical Measurements: Goal: Ability to maintain clinical measurements within normal limits will improve Outcome: Progressing Goal: Will remain free from infection Outcome: Progressing Goal: Respiratory complications will improve Outcome: Progressing   Problem: Safety: Goal: Ability to remain free from injury will improve Outcome: Progressing

## 2023-03-02 NOTE — Plan of Care (Signed)

## 2023-03-02 NOTE — Progress Notes (Signed)
Rounding Note    Patient Name: CARSON MECHE Date of Encounter: 03/02/2023  [facility] Cardiologist: Yvonne Kendall, MD   Subjective   Doing okay, no acute events overnight.  Ambulated with PT in no setbacks, no episodes of falls.  Inpatient Medications    Scheduled Meds:  aspirin EC  81 mg Oral Daily   bisoprolol  5 mg Oral Daily   enoxaparin (LOVENOX) injection  40 mg Subcutaneous Q24H   levothyroxine  88 mcg Oral QHS   pantoprazole  40 mg Oral Daily   rosuvastatin  10 mg Oral QHS   sodium chloride flush  3 mL Intravenous Q12H   temazepam  15 mg Oral QHS   Continuous Infusions:  sodium chloride     cefTRIAXone (ROCEPHIN)  IV 2 g (03/02/23 0931)   PRN Meds: sodium chloride, ondansetron **OR** ondansetron (ZOFRAN) IV, sodium chloride flush   Vital Signs    Vitals:   03/01/23 1126 03/01/23 1559 03/01/23 2350 03/02/23 0817  BP: (!) 129/58 116/86 (!) 106/54 114/62  Pulse: 63 71 66 70  Resp: 17 18 17 16   Temp: 98.5 F (36.9 C) 99.2 F (37.3 C) 98.8 F (37.1 C) 98.3 F (36.8 C)  TempSrc: Oral Oral Oral Oral  SpO2: 95% 95% 92% 94%  Weight:      Height:        Intake/Output Summary (Last 24 hours) at 03/02/2023 1419 Last data filed at 03/02/2023 1029 Gross per 24 hour  Intake 787.7 ml  Output --  Net 787.7 ml      03/01/2023    7:14 AM 02/18/2023    2:33 PM 11/30/2022   10:31 AM  Last 3 Weights  Weight (lbs) 150 lb 150 lb 149 lb 12.8 oz  Weight (kg) 68.04 kg 68.04 kg 67.949 kg      Telemetry    Sinus rhythm, PACs- Personally Reviewed  ECG     - Personally Reviewed  Physical Exam   GEN: No acute distress.   Neck: No JVD Cardiac: RRR, 2/6 systolic murmur Respiratory: Clear to auscultation bilaterally. GI: Soft, nontender, non-distended  MS: No edema; No deformity. Neuro:  Nonfocal  Psych: Normal affect   Labs    High Sensitivity Troponin:   Recent Labs  Lab 03/01/23 0752 03/01/23 1007  TROPONINIHS 122* 511*      Chemistry Recent Labs  Lab 03/01/23 0752 03/02/23 0355 03/02/23 1247  NA 133* 133*  --   K 4.2 3.5  --   CL 102 105  --   CO2 20* 22  --   GLUCOSE 121* 95  --   BUN 19 18  --   CREATININE 0.90 0.91  --   CALCIUM 8.8* 8.1*  --   MG  --   --  2.3  PROT 6.6 6.0*  --   ALBUMIN 3.6 3.0*  --   AST 25 25  --   ALT 12 11  --   ALKPHOS 38 35*  --   BILITOT 1.8* 1.0  --   GFRNONAA >60 60*  --   ANIONGAP 11 6  --     Lipids No results for input(s): "CHOL", "TRIG", "HDL", "LABVLDL", "LDLCALC", "CHOLHDL" in the last 168 hours.  Hematology Recent Labs  Lab 03/01/23 0752 03/02/23 0355  WBC 21.2* 14.0*  RBC 4.18 3.71*  HGB 11.7* 10.3*  HCT 35.5* 31.9*  MCV 84.9 86.0  MCH 28.0 27.8  MCHC 33.0 32.3  RDW 15.2 15.3  PLT 179  153   Thyroid  Recent Labs  Lab 03/02/23 1247  TSH 7.416*  FREET4 1.09    BNP Recent Labs  Lab 03/01/23 0752  BNP 1,726.1*    DDimer No results for input(s): "DDIMER" in the last 168 hours.   Radiology    DG Knee Complete 4 Views Left  Result Date: 03/01/2023 CLINICAL DATA:  left knee pain, fall eval for fracture EXAM: LEFT KNEE - COMPLETE 4+ VIEW COMPARISON:  None Available. FINDINGS: No acute fracture or dislocation. No aggressive osseous lesion. There are degenerative changes of the knee joint in the form of mildly reduced lateral tibio-femoral compartment joint space, tibial spiking and tricompartmental osteophytosis. No knee effusion or focal soft tissue swelling. No radiopaque foreign bodies. IMPRESSION: Negative. Electronically Signed   By: Jules Schick M.D.   On: 03/01/2023 09:49   DG Chest Port 1 View  Result Date: 03/01/2023 CLINICAL DATA:  Chills, shortness of breath and sepsis. EXAM: PORTABLE CHEST 1 VIEW COMPARISON:  11/04/2022 FINDINGS: Stable mild cardiac enlargement. Stable mild pulmonary vascular prominence which may reflect mild pulmonary venous hypertension. No overt pulmonary edema, airspace consolidation, pleural fluid or  pneumothorax identified. Stable partially visualized left shoulder hemiarthroplasty. IMPRESSION: Stable mild cardiac enlargement and mild pulmonary vascular prominence which may reflect mild pulmonary venous hypertension. Electronically Signed   By: Irish Lack M.D.   On: 03/01/2023 08:17    Cardiac Studies   EF 60%,  Patient Profile     89 y.o. female with history of obstructive HCM, nonobstructive CAD, PAD presenting with a fall, diagnosed with a UTI being seen due to elevated troponins.  Assessment & Plan    Elevated troponins -Denies chest pain -Represent demand supply mismatch.  Not consistent with ACS. -No additional cardiac testing indicated.  2.  Fall -Mechanically induced -No issues ambulating with PT  3.  Obstructive HCM -Continue bisoprolol -EF normal.  4.  UTI -Antibiotics, treatment as per primary team.  No additional cardiac testing planned.  Follow-up with primary cardiologist as outpatient.  Cardiology will sign off.    Total encounter time more than 50 minutes  Greater than 50% was spent in counseling and coordination of care with the patient   Signed, Debbe Odea, MD  03/02/2023, 2:19 PM

## 2023-03-02 NOTE — Evaluation (Signed)
Occupational Therapy Evaluation Patient Details Name: Kathryn Wise MRN: [id number] DOB: October 20, 1929 Today's Date: 03/02/2023   History of Present Illness Kathryn Wise is a 89 y.o. female presents from home with chief complaint of chills, SOB and weakness. PMH significant for CAD, Carotid Artery Stenosis, HTN. Per chart, urinalysis indicative of infection.   Clinical Impression   Upon entering the room, pt seated on EOB eating lunch with family present in room. Pt is agreeable to OT intervention and endorses being Ind at home and living alone. She is Ind in self care, IADLs, and still drives. Pt stands without assistance and ambulates into bathroom and demonstrates ability to manage LB clothing and perform toilet transfer without assistance or LOB. Pt ambulates in room without assistance this session and reports feeling close to baseline. PT arrives and pt transitions easily to PT evaluation. Pt does not need skilled OT intervention at this time. OT to sign off.      Recommendations for follow up therapy are one component of a multi-disciplinary discharge planning process, led by the attending physician.  Recommendations may be updated based on patient status, additional functional criteria and insurance authorization.   Assistance Recommended at Discharge None     Functional Status Assessment  Patient has not had a recent decline in their functional status  Equipment Recommendations  None recommended by OT       Precautions / Restrictions Precautions Precautions: None Restrictions Weight Bearing Restrictions: No      Mobility Bed Mobility               General bed mobility comments: seated on EOB    Transfers Overall transfer level: Modified independent Equipment used: None                          ADL either performed or assessed with clinical judgement   ADL Overall ADL's : Independent                                              Vision Patient Visual Report: No change from baseline              Pertinent Vitals/Pain Pain Assessment Pain Score: 2  Pain Descriptors / Indicators: Aching, Discomfort Pain Intervention(s): Monitored during session, Repositioned     Hand Dominance Right   Extremity/Trunk Assessment Upper Extremity Assessment Upper Extremity Assessment: Overall WFL for tasks assessed   Lower Extremity Assessment Lower Extremity Assessment: Overall WFL for tasks assessed       Communication Communication Communication: No difficulties   Cognition Arousal/Alertness: Awake/alert Behavior During Therapy: WFL for tasks assessed/performed Overall Cognitive Status: Within Functional Limits for tasks assessed                                                  Home Living Family/patient expects to be discharged to:: Private residence Living Arrangements: Alone Available Help at Discharge: Family Type of Home: House Home Access: Stairs to enter Secretary/administrator of Steps: 3 Entrance Stairs-Rails: None Home Layout: One level     Bathroom Shower/Tub: Walk-in shower         Home Equipment: Grab bars - toilet;Grab bars - tub/shower;Shower seat  Prior Functioning/Environment Prior Level of Function : Independent/Modified Independent;History of Falls (last six months);Driving             Mobility Comments: IND with ambulation, does report 1 fall in last 6 months (reports she caught her toe on accident) ADLs Comments: IND with ADLs and IADls                 OT Goals(Current goals can be found in the care plan section) Acute Rehab OT Goals Patient Stated Goal: to go home OT Goal Formulation: With patient/family Time For Goal Achievement: 03/02/23 Potential to Achieve Goals: Good  OT Frequency:         AM-PAC OT "6 Clicks" Daily Activity     Outcome Measure Help from another person eating meals?: None Help from another person taking  care of personal grooming?: None Help from another person toileting, which includes using toliet, bedpan, or urinal?: None Help from another person bathing (including washing, rinsing, drying)?: None Help from another person to put on and taking off regular upper body clothing?: None Help from another person to put on and taking off regular lower body clothing?: None 6 Click Score: 24   End of Session    Activity Tolerance: Patient tolerated treatment well Patient left: Other (comment);with family/visitor present (handoff to PT)                   Time: 1303-1310 OT Time Calculation (min): 7 min Charges:  OT General Charges $OT Visit: 1 Visit OT Evaluation $OT Eval Low Complexity: 1 Low  Jackquline Denmark, MS, OTR/L , CBIS ascom [phone]  03/02/23, 1:56 PM

## 2023-03-03 DIAGNOSIS — N10 Acute tubulo-interstitial nephritis: Secondary | ICD-10-CM | POA: Diagnosis not present

## 2023-03-03 LAB — CBC WITH DIFFERENTIAL/PLATELET
Abs Immature Granulocytes: 0.02 10*3/uL (ref 0.00–0.07)
Basophils Absolute: 0 10*3/uL (ref 0.0–0.1)
Basophils Relative: 0 %
Eosinophils Absolute: 0.1 10*3/uL (ref 0.0–0.5)
Eosinophils Relative: 1 %
HCT: 30.7 % — ABNORMAL LOW (ref 36.0–46.0)
Hemoglobin: 9.9 g/dL — ABNORMAL LOW (ref 12.0–15.0)
Immature Granulocytes: 0 %
Lymphocytes Relative: 24 %
Lymphs Abs: 2.2 10*3/uL (ref 0.7–4.0)
MCH: 28 pg (ref 26.0–34.0)
MCHC: 32.2 g/dL (ref 30.0–36.0)
MCV: 86.7 fL (ref 80.0–100.0)
Monocytes Absolute: 1 10*3/uL (ref 0.1–1.0)
Monocytes Relative: 10 %
Neutro Abs: 6.1 10*3/uL (ref 1.7–7.7)
Neutrophils Relative %: 65 %
Platelets: 145 10*3/uL — ABNORMAL LOW (ref 150–400)
RBC: 3.54 MIL/uL — ABNORMAL LOW (ref 3.87–5.11)
RDW: 15.1 % (ref 11.5–15.5)
WBC: 9.5 10*3/uL (ref 4.0–10.5)
nRBC: 0 % (ref 0.0–0.2)

## 2023-03-03 LAB — BASIC METABOLIC PANEL WITH GFR
Anion gap: 4 — ABNORMAL LOW (ref 5–15)
BUN: 19 mg/dL (ref 8–23)
CO2: 22 mmol/L (ref 22–32)
Calcium: 8.2 mg/dL — ABNORMAL LOW (ref 8.9–10.3)
Chloride: 107 mmol/L (ref 98–111)
Creatinine, Ser: 0.88 mg/dL (ref 0.44–1.00)
GFR, Estimated: >60 mL/min (ref >60)
Glucose, Bld: 98 mg/dL (ref 70–99)
Potassium: 4 mmol/L (ref 3.5–5.1)
Sodium: 133 mmol/L — ABNORMAL LOW (ref 135–145)

## 2023-03-03 MED ORDER — POLYETHYLENE GLYCOL 3350 17 G PO PACK: 17.0000 g | PACK | Freq: Every day | ORAL | Status: DC | PRN

## 2023-03-03 MED ORDER — BISOPROLOL FUMARATE 5 MG PO TABS: 5.0000 mg | ORAL_TABLET | ORAL | Status: DC

## 2023-03-03 MED ORDER — BISOPROLOL FUMARATE 5 MG PO TABS: 5.0000 mg | ORAL_TABLET | Freq: Every day | ORAL | Status: DC

## 2023-03-03 MED ORDER — CEFDINIR 300 MG PO CAPS: 300.0000 mg | ORAL_CAPSULE | Freq: Two times a day (BID) | ORAL | 0 refills | Status: AC

## 2023-03-03 NOTE — Discharge Summary (Signed)
Physician Discharge Summary   Patient: Kathryn Wise MRN: [id number] DOB: [birth year]  Admit date:     03/01/2023  Discharge date: 03/03/23  Discharge Physician: Baldwin Jamaica   PCP: Enid Baas, MD   Recommendations at discharge:   Please take Cefdinir 300 mg BID x 5 days and follow up with your primary care physician to ensure resolution of symptoms.   Also need to follow up on urine culture susceptibilities which were pending on day of discharge. Please follow up with your regular Cardiologist as well to manage your heart rate and other cardiac conditions.  Discharge Diagnoses: Principal Problem:   UTI (urinary tract infection) Active Problems:   NSTEMI (non-ST elevated myocardial infarction) (HCC)   Hypothyroidism   Acute heart failure with preserved ejection fraction (HFpEF) (HCC)   GERD (gastroesophageal reflux disease)   CAD (coronary atherosclerotic disease)   Hyperlipidemia LDL goal <130   Renal artery stenosis (HCC)   Carotid stenosis   Paroxysmal tachycardia (HCC)   HOCM (hypertrophic obstructive cardiomyopathy) (HCC)   Leukocytosis   Rigors   Demand ischemia   Obstructive hypertrophic cardiomyopathy (HCC)  Resolved Problems:   * No resolved hospital problems. *  Hospital Course: 89 year old female with PMH of Chronic Hypertension with LVH and HOCM, nonobstructive CAD, left renal artery stenosis s/p 2020 stent placement who presented to the ED on 8/2 with fevers chills found to have E. Coli UTI with left pyelonephritis.  CT Scan reports "Mild fullness in the left renal pelvis and proximal left ureter with subtle peripelvic and periureteric edema. Possible segmental decreased perfusion to the lower pole left kidney. Imaging features are nonspecific but can be seen in the setting of pyelonephritis."   Assessment and Plan: Acute Left Pyelonephritis with E. Coli UTI: Urine culture grew E. Coli. Susceptibilities are pending. - Patient received 3 days of IV  Ceftriaxone and gentle. She rapidly improved after 24 hours and was back to baseline after 48 hours.   - She was discharged on Cefdinir 300 mg BID x 7 days to complete 10 day course.  Elevated Troponin - this was deemed to be type 2 demand ischemia: - Patient was evaluated by Cardiology who did not recommend further workup.   Chronic Grade 1 Diastolic Heart Failure with EF 50-55% / HOCM / Moderate Mitral Regurgitation: - S/p fluids x 24 hours.  Physical exam was euvolemic.  CT Scan showed trace pleural effusions.   - Moving forward, try to avoid fluids if possible as patient is susceptible to volume overload.   Frequent PVCs: - Bisoprolol is ordered as 5 mg every other day. - Follow up with Cardiology outpatient.  Labile Hypertension with HOCM: Patient has history of difficulty to control blood pressure, however it was stable during hospitalization. - Continue current regimen Bisoprolol 5 mg every other day and Amlodipine as needed.  Follow up with Cardiology outpatient.   Carotid Artery Stenosis s/p 09/2020 stent: - Aspirin and Statin.   Renal Artery Stenosis s/p 12/2018 stent: - Aspirin and Statin.  Hyperlipidemia: - Statin.   Hypothyroidism: - Synthroid.   GERD: - PPI.   Moderate Hiatal Hernia: - Asymptomatic.   Aortic Atherosclerosis: - Aspirin and Statin.   Pancreatic Cyst: CT reports "12 mm low-density lesion in the head of the pancreas" reported on CT Scan.  This is described as most likely a benign cyst. - Monitor outpatient.   Moderate Hiatal Hernia: Aortic Atherosclerosis:   Consultants: Cardiology Procedures performed: None  Disposition: Home Diet recommendation:  Cardiac diet DISCHARGE MEDICATION: Allergies as of 03/03/2023       Reactions   Alendronate Swelling   And nausea    Codeine    Lisinopril    Spike in BP   Metoprolol Other (See Comments)   Lethargy - PT CURRENTLY TAKING    Oxycodone    Other reaction(s): Other (See Comments) Altered  mental status   Oxycontin [oxycodone Hcl]    Altered mental status        Medication List     STOP taking these medications    sucralfate 1 g tablet Commonly known as: Carafate       TAKE these medications    amLODipine 5 MG tablet Commonly known as: NORVASC Take 1 tablet (5 mg total) by mouth as needed (Once daily AS NEEDED for SBP (top blood pressure number) is greater than 160.).   aspirin EC 81 MG tablet Take 81 mg by mouth daily.   bisoprolol 5 MG tablet Commonly known as: ZEBETA Take 1 tablet (5 mg total) by mouth every other day. Take 0.5 tablet (2.5 mg) by mouth once daily What changed:  how much to take how to take this when to take this   cefdinir 300 MG capsule Commonly known as: OMNICEF Take 1 capsule (300 mg total) by mouth 2 (two) times daily for 7 days.   cyanocobalamin 1000 MCG tablet Commonly known as: VITAMIN B12 Take 1,000 mcg by mouth daily.   levothyroxine 88 MCG tablet Commonly known as: SYNTHROID TAKE 1 TABLET(88 MCG) BY MOUTH DAILY BEFORE BREAKFAST What changed: See the new instructions.   nitroGLYCERIN 0.4 MG SL tablet Commonly known as: NITROSTAT Place 1 tablet (0.4 mg total) under the tongue every 5 (five) minutes as needed for chest pain.   omeprazole 20 MG capsule Commonly known as: PRILOSEC TAKE 1 CAPSULE(20 MG) BY MOUTH EVERY MORNING AS NEEDED FOR HEARTBURN What changed: See the new instructions.   rosuvastatin 10 MG tablet Commonly known as: CRESTOR Take 1 tablet (10 mg total) by mouth at bedtime.   temazepam 15 MG capsule Commonly known as: RESTORIL TAKE 1 CAPSULE BY MOUTH AT BEDTIME AS NEEDED SLEEP What changed:  how much to take how to take this when to take this additional instructions   Turmeric 500 MG Caps Take 500 mg by mouth daily.   Vitamin D-3 25 MCG (1000 UT) Caps Take 1,000 Units by mouth daily.        Discharge Exam: Filed Weights   03/01/23 0714  Weight: 68 kg   Physical  Exam Constitutional:      General: She is not in acute distress.    Comments: Multiple bruises on face from recent fall.  HENT:     Head: Normocephalic and atraumatic.  Eyes:     Extraocular Movements: Extraocular movements intact.     Pupils: Pupils are equal, round, and reactive to light.  Cardiovascular:     Rate and Rhythm: Normal rate and regular rhythm.     Heart sounds: Murmur heard.  Pulmonary:     Effort: Pulmonary effort is normal.     Breath sounds: Normal breath sounds.  Abdominal:     Palpations: Abdomen is soft.     Tenderness: There is no abdominal tenderness.  Musculoskeletal:     Cervical back: Normal range of motion and neck supple.  Skin:    General: Skin is warm and dry.     Capillary Refill: Capillary refill takes less than 2 seconds.  Neurological:  General: No focal deficit present.  Psychiatric:        Mood and Affect: Mood normal.      Condition at discharge: stable  The results of significant diagnostics from this hospitalization (including imaging, microbiology, ancillary and laboratory) are listed below for reference.   Imaging Studies: CT ABDOMEN PELVIS W CONTRAST  Result Date: 03/02/2023 CLINICAL DATA:  UTI.  Abdominal pain nonlocalized. EXAM: CT ABDOMEN AND PELVIS WITH CONTRAST TECHNIQUE: Multidetector CT imaging of the abdomen and pelvis was performed using the standard protocol following bolus administration of intravenous contrast. RADIATION DOSE REDUCTION: This exam was performed according to the departmental dose-optimization program which includes automated exposure control, adjustment of the mA and/or kV according to patient size and/or use of iterative reconstruction technique. CONTRAST:  OMNIPAQUE IOHEXOL 300 MG/ML  SOLN COMPARISON:  None Available. FINDINGS: Lower chest: Moderate hiatal hernia. Mitral and aortic valve calcification. Trace bilateral pleural effusions. Hepatobiliary: No suspicious focal abnormality within the liver  parenchyma. There is no evidence for gallstones, gallbladder wall thickening, or pericholecystic fluid. No intrahepatic or extrahepatic biliary dilation. Pancreas: 12 mm low-density lesion in the head of the pancreas has attenuation higher than would be expected for a simple cyst. Lesion appears to show increasing enhancement on delayed imaging. No main duct dilatation. Spleen: No splenomegaly. No suspicious focal mass lesion. Adrenals/Urinary Tract: No adrenal nodule or mass. Tiny hypodensity in the right kidney is too small to characterize but statistically most likely benign. No followup imaging is recommended. Right ureter unremarkable. Mild fullness noted in the left renal pelvis and proximal left ureter with subtle peripelvic and periureteric edema. Possible segmental decreased perfusion to the lower pole left kidney on image 23/8. The urinary bladder appears normal for the degree of distention. Stomach/Bowel: Moderate hiatal hernia. Stomach otherwise unremarkable. Duodenum is normally positioned as is the ligament of Treitz. No small bowel wall thickening. No small bowel dilatation. The terminal ileum is normal. The appendix is not discretely visible, but there is no edema or inflammation in the region of the cecal tip to suggest appendicitis. No gross colonic mass. No colonic wall thickening. Scattered diverticuli are seen along the left colon. Vascular/Lymphatic: There is moderate atherosclerotic calcification of the abdominal aorta without aneurysm. There is no gastrohepatic or hepatoduodenal ligament lymphadenopathy. No retroperitoneal or mesenteric lymphadenopathy. No pelvic sidewall lymphadenopathy. Reproductive: Retroflexed uterus with anterior intramural fundal fibroid. There is no adnexal mass. Other: No intraperitoneal free fluid. Musculoskeletal: No worrisome lytic or sclerotic osseous abnormality. IMPRESSION: 1. Mild fullness in the left renal pelvis and proximal left ureter with subtle peripelvic  and periureteric edema. Possible segmental decreased perfusion to the lower pole left kidney. Imaging features are nonspecific but can be seen in the setting of pyelonephritis. Correlation with urinalysis recommended. 2. 12 mm low-density lesion in the head of the pancreas has attenuation higher than would be expected for a simple cyst imaging and may show further enhancement on renal delay imaging. Review of a noncontrast chest CT of 12/08/2020 suggests that the lesion was probably present on that study but extremely subtle given the lack of intravenous contrast material. This relative stability is reassuring for benign etiology. Follow-up CT abdomen with contrast in 3-6 months could be used to ensure continued stability. 3. Moderate hiatal hernia. 4. Trace bilateral pleural effusions. 5.  Aortic Atherosclerosis (ICD10-I70.0). Electronically Signed   By: Kennith Center M.D.   On: 03/02/2023 15:44   DG Knee Complete 4 Views Left  Result Date: 03/01/2023 CLINICAL DATA:  left knee pain, fall eval for fracture EXAM: LEFT KNEE - COMPLETE 4+ VIEW COMPARISON:  None Available. FINDINGS: No acute fracture or dislocation. No aggressive osseous lesion. There are degenerative changes of the knee joint in the form of mildly reduced lateral tibio-femoral compartment joint space, tibial spiking and tricompartmental osteophytosis. No knee effusion or focal soft tissue swelling. No radiopaque foreign bodies. IMPRESSION: Negative. Electronically Signed   By: Jules Schick M.D.   On: 03/01/2023 09:49   DG Chest Port 1 View  Result Date: 03/01/2023 CLINICAL DATA:  Chills, shortness of breath and sepsis. EXAM: PORTABLE CHEST 1 VIEW COMPARISON:  11/04/2022 FINDINGS: Stable mild cardiac enlargement. Stable mild pulmonary vascular prominence which may reflect mild pulmonary venous hypertension. No overt pulmonary edema, airspace consolidation, pleural fluid or pneumothorax identified. Stable partially visualized left shoulder  hemiarthroplasty. IMPRESSION: Stable mild cardiac enlargement and mild pulmonary vascular prominence which may reflect mild pulmonary venous hypertension. Electronically Signed   By: Irish Lack M.D.   On: 03/01/2023 08:17   DG Hand Complete Left  Result Date: 02/18/2023 CLINICAL DATA:  Trauma, fall EXAM: LEFT HAND - COMPLETE 3+ VIEW COMPARISON:  None Available. FINDINGS: No recent fracture or dislocation is seen. Degenerative changes are noted with bony spurs in multiple interphalangeal and metacarpophalangeal joints. Marked degenerative changes are noted in first carpometacarpal joint. Degenerative changes are noted in the intercarpal joints along the lateral aspect of the wrist. IMPRESSION: No recent fracture or dislocation is seen in left hand. Degenerative changes are noted in multiple interphalangeal joints, metacarpophalangeal joints, first carpometacarpal joint and intercarpal joints along the lateral aspect of the left wrist. Electronically Signed   By: Ernie Avena M.D.   On: 02/18/2023 16:23   DG Wrist Complete Right  Result Date: 02/18/2023 CLINICAL DATA:  Pain after fall EXAM: RIGHT WRIST - COMPLETE 4 VIEW COMPARISON:  None Available. FINDINGS: Advanced degenerative changes of the first carpometacarpal joint with joint space loss, sclerosis and hypertrophic changes. Mild sclerosis as well along the distal aspect of the scaphoid. No acute fracture or dislocation. Preserved bone mineralization and other joint spaces. There also degenerative changes elsewhere about the visualized portions of the thumb. Overall if there is further concern of scaphoid injury or persistent pain, follow up imaging is recommended in 7-10 days to assess for occult abnormality. IMPRESSION: Multifocal degenerative changes about the thumb including the first carpometacarpal joint. Electronically Signed   By: Karen Kays M.D.   On: 02/18/2023 16:17   CT Maxillofacial Wo Contrast  Result Date:  02/18/2023 CLINICAL DATA:  Facial trauma, blunt EXAM: CT HEAD WITHOUT CONTRAST CT MAXILLOFACIAL WITHOUT CONTRAST CT CERVICAL SPINE WITHOUT CONTRAST TECHNIQUE: Multidetector CT imaging of the head, cervical spine, and maxillofacial structures were performed using the standard protocol without intravenous contrast. Multiplanar CT image reconstructions of the cervical spine and maxillofacial structures were also generated. RADIATION DOSE REDUCTION: This exam was performed according to the departmental dose-optimization program which includes automated exposure control, adjustment of the mA and/or kV according to patient size and/or use of iterative reconstruction technique. COMPARISON:  None Available. FINDINGS: CT HEAD FINDINGS Brain: No evidence of acute infarction, hemorrhage, hydrocephalus, extra-axial collection or mass lesion/mass effect. Vascular: No hyperdense vessel or unexpected calcification. Skull: Soft swelling along the midline frontal scalp without evidence of underlying calvarial fracture. Other: None. CT MAXILLOFACIAL FINDINGS Osseous: Acute mildly displaced and likely comminuted fracture of the nasal bone. There is likely also a mildly displaced fracture of the maxillary spine (series 5, image 106).  Orbits: Negative. No traumatic or inflammatory finding. Sinuses: No middle ear or mastoid effusion. Paranasal sinuses are clear. Bilateral lens replacement. Orbits are otherwise unremarkable. Soft tissues: Soft tissue swelling along the right infraorbital soft tissues. CT CERVICAL SPINE FINDINGS Alignment: Straightening of the normal cervical lordosis. Grade 1 anterolisthesis of C3 on C4. Skull base and vertebrae: No acute fracture. No primary bone lesion or focal pathologic process. Soft tissues and spinal canal: No prevertebral fluid or swelling. No visible canal hematoma. Disc levels:  No evidence of high-grade spinal stenosis. Upper chest: Negative. Other: Left carotid stent in place. IMPRESSION: 1. No  CT evidence of intracranial injury. 2. Acute mildly displaced and likely comminuted fracture of the nasal bone. There is likely also a mildly displaced fracture of the maxillary spine. 3. Soft tissue swelling along the midline frontal scalp without evidence of underlying calvarial fracture. 4. Soft tissue swelling along the right infraorbital soft tissues. 5. No acute fracture or traumatic subluxation of the cervical spine. Electronically Signed   By: Lorenza Cambridge M.D.   On: 02/18/2023 15:32   CT HEAD WO CONTRAST ( )  Result Date: 02/18/2023 CLINICAL DATA:  Facial trauma, blunt EXAM: CT HEAD WITHOUT CONTRAST CT MAXILLOFACIAL WITHOUT CONTRAST CT CERVICAL SPINE WITHOUT CONTRAST TECHNIQUE: Multidetector CT imaging of the head, cervical spine, and maxillofacial structures were performed using the standard protocol without intravenous contrast. Multiplanar CT image reconstructions of the cervical spine and maxillofacial structures were also generated. RADIATION DOSE REDUCTION: This exam was performed according to the departmental dose-optimization program which includes automated exposure control, adjustment of the mA and/or kV according to patient size and/or use of iterative reconstruction technique. COMPARISON:  None Available. FINDINGS: CT HEAD FINDINGS Brain: No evidence of acute infarction, hemorrhage, hydrocephalus, extra-axial collection or mass lesion/mass effect. Vascular: No hyperdense vessel or unexpected calcification. Skull: Soft swelling along the midline frontal scalp without evidence of underlying calvarial fracture. Other: None. CT MAXILLOFACIAL FINDINGS Osseous: Acute mildly displaced and likely comminuted fracture of the nasal bone. There is likely also a mildly displaced fracture of the maxillary spine (series 5, image 106). Orbits: Negative. No traumatic or inflammatory finding. Sinuses: No middle ear or mastoid effusion. Paranasal sinuses are clear. Bilateral lens replacement. Orbits are  otherwise unremarkable. Soft tissues: Soft tissue swelling along the right infraorbital soft tissues. CT CERVICAL SPINE FINDINGS Alignment: Straightening of the normal cervical lordosis. Grade 1 anterolisthesis of C3 on C4. Skull base and vertebrae: No acute fracture. No primary bone lesion or focal pathologic process. Soft tissues and spinal canal: No prevertebral fluid or swelling. No visible canal hematoma. Disc levels:  No evidence of high-grade spinal stenosis. Upper chest: Negative. Other: Left carotid stent in place. IMPRESSION: 1. No CT evidence of intracranial injury. 2. Acute mildly displaced and likely comminuted fracture of the nasal bone. There is likely also a mildly displaced fracture of the maxillary spine. 3. Soft tissue swelling along the midline frontal scalp without evidence of underlying calvarial fracture. 4. Soft tissue swelling along the right infraorbital soft tissues. 5. No acute fracture or traumatic subluxation of the cervical spine. Electronically Signed   By: Lorenza Cambridge M.D.   On: 02/18/2023 15:32   CT CERVICAL SPINE WO CONTRAST  Result Date: 02/18/2023 CLINICAL DATA:  Facial trauma, blunt EXAM: CT HEAD WITHOUT CONTRAST CT MAXILLOFACIAL WITHOUT CONTRAST CT CERVICAL SPINE WITHOUT CONTRAST TECHNIQUE: Multidetector CT imaging of the head, cervical spine, and maxillofacial structures were performed using the standard protocol without intravenous contrast. Multiplanar CT image reconstructions  of the cervical spine and maxillofacial structures were also generated. RADIATION DOSE REDUCTION: This exam was performed according to the departmental dose-optimization program which includes automated exposure control, adjustment of the mA and/or kV according to patient size and/or use of iterative reconstruction technique. COMPARISON:  None Available. FINDINGS: CT HEAD FINDINGS Brain: No evidence of acute infarction, hemorrhage, hydrocephalus, extra-axial collection or mass lesion/mass effect.  Vascular: No hyperdense vessel or unexpected calcification. Skull: Soft swelling along the midline frontal scalp without evidence of underlying calvarial fracture. Other: None. CT MAXILLOFACIAL FINDINGS Osseous: Acute mildly displaced and likely comminuted fracture of the nasal bone. There is likely also a mildly displaced fracture of the maxillary spine (series 5, image 106). Orbits: Negative. No traumatic or inflammatory finding. Sinuses: No middle ear or mastoid effusion. Paranasal sinuses are clear. Bilateral lens replacement. Orbits are otherwise unremarkable. Soft tissues: Soft tissue swelling along the right infraorbital soft tissues. CT CERVICAL SPINE FINDINGS Alignment: Straightening of the normal cervical lordosis. Grade 1 anterolisthesis of C3 on C4. Skull base and vertebrae: No acute fracture. No primary bone lesion or focal pathologic process. Soft tissues and spinal canal: No prevertebral fluid or swelling. No visible canal hematoma. Disc levels:  No evidence of high-grade spinal stenosis. Upper chest: Negative. Other: Left carotid stent in place. IMPRESSION: 1. No CT evidence of intracranial injury. 2. Acute mildly displaced and likely comminuted fracture of the nasal bone. There is likely also a mildly displaced fracture of the maxillary spine. 3. Soft tissue swelling along the midline frontal scalp without evidence of underlying calvarial fracture. 4. Soft tissue swelling along the right infraorbital soft tissues. 5. No acute fracture or traumatic subluxation of the cervical spine. Electronically Signed   By: Lorenza Cambridge M.D.   On: 02/18/2023 15:32    Microbiology: Results for orders placed or performed during the hospital encounter of 03/01/23  SARS Coronavirus 2 by RT PCR (hospital order, performed in [facility] hospital lab) *cepheid single result test* Anterior Nasal Swab     Status: None   Collection Time: 03/01/23  7:18 AM   Specimen: Anterior Nasal Swab  Result Value Ref Range  Status   SARS Coronavirus 2 by RT PCR NEGATIVE NEGATIVE Final    Comment: (NOTE) SARS-CoV-2 target nucleic acids are NOT DETECTED.  The SARS-CoV-2 RNA is generally detectable in upper and lower respiratory specimens during the acute phase of infection. The lowest concentration of SARS-CoV-2 viral copies this assay can detect is 250 copies / mL. A negative result does not preclude SARS-CoV-2 infection and should not be used as the sole basis for treatment or other patient management decisions.  A negative result may occur with improper specimen collection / handling, submission of specimen other than nasopharyngeal swab, presence of viral mutation(s) within the areas targeted by this assay, and inadequate number of viral copies (<250 copies / mL). A negative result must be combined with clinical observations, patient history, and epidemiological information.  Fact Sheet for Patients:   RoadLapTop.co.za  Fact Sheet for Healthcare Providers: http://kim-miller.com/  This test is not yet approved or  cleared by the [place] FDA and has been authorized for detection and/or diagnosis of SARS-CoV-2 by FDA under an Emergency Use Authorization (EUA).  This EUA will remain in effect (meaning this test can be used) for the duration of the COVID-19 declaration under Section 564(b)(1) of the Act, 21 U.S.C. section 360bbb-3(b)(1), unless the authorization is terminated or revoked sooner.  Performed at [facility], [address] [place], [place] [address]   Blood Culture (routine x 2)     Status: None (Preliminary result)   Collection Time: 03/01/23  7:52 AM   Specimen: BLOOD  Result Value Ref Range Status   Specimen Description BLOOD BLOOD LEFT ARM  Final   Special Requests   Final    BOTTLES DRAWN AEROBIC AND ANAEROBIC Blood Culture adequate volume   Culture   Final    NO GROWTH 2 DAYS Performed at [facility], [address] [place], [place] [address]    Report Status PENDING  Incomplete  Blood Culture (routine x 2)     Status: None (Preliminary result)   Collection Time: 03/01/23  8:29 AM   Specimen: BLOOD  Result Value Ref Range Status   Specimen Description BLOOD BLOOD LEFT ARM  Final   Special Requests   Final    BOTTLES DRAWN AEROBIC AND ANAEROBIC Blood Culture adequate volume   Culture   Final    NO GROWTH 2 DAYS Performed at [facility], [address] [place], [place] [address]    Report Status PENDING  Incomplete  Urine Culture     Status: Abnormal   Collection Time: 03/01/23  8:52 AM   Specimen: Urine, Random  Result Value Ref Range Status   Specimen Description   Final    URINE, RANDOM Performed at [facility], [address] [place], [place] [address]    Special Requests   Final    NONE Reflexed from [id number] Performed at [facility], [address] [place], [place] [address]    Culture >=100,000 COLONIES/mL ESCHERICHIA COLI (A)  Final   Report Status 03/03/2023 FINAL  Final   Organism ID, Bacteria ESCHERICHIA COLI (A)  Final      Susceptibility   Escherichia coli - MIC*    AMPICILLIN >=32 RESISTANT Resistant     CEFAZOLIN <=4 SENSITIVE Sensitive     CEFEPIME <=0.12 SENSITIVE Sensitive     CEFTRIAXONE <=0.25 SENSITIVE Sensitive     CIPROFLOXACIN <=0.25 SENSITIVE Sensitive     GENTAMICIN <=1 SENSITIVE Sensitive     IMIPENEM <=0.25 SENSITIVE Sensitive     NITROFURANTOIN <=16 SENSITIVE Sensitive     TRIMETH/SULFA <=20 SENSITIVE Sensitive     AMPICILLIN/SULBACTAM 16 INTERMEDIATE Intermediate     PIP/TAZO <=4 SENSITIVE Sensitive     * >=100,000 COLONIES/mL ESCHERICHIA COLI    Labs: CBC: Recent Labs  Lab 03/01/23 0752 03/02/23 0355 03/03/23 0519  WBC 21.2* 14.0* 9.5  NEUTROABS 17.7*  --  6.1  HGB 11.7* 10.3* 9.9*  HCT 35.5* 31.9* 30.7*  MCV 84.9 86.0 86.7  PLT 179 153 145*   Basic Metabolic Panel: Recent Labs  Lab  03/01/23 0752 03/02/23 0355 03/02/23 1247 03/03/23 0519  NA 133* 133*  --  133*  K 4.2 3.5  --  4.0  CL 102 105  --  107  CO2 20* 22  --  22  GLUCOSE 121* 95  --  98  BUN 19 18  --  19  CREATININE 0.90 0.91  --  0.88  CALCIUM 8.8* 8.1*  --  8.2*  MG  --   --  2.3  --    Liver Function Tests: Recent Labs  Lab 03/01/23 0752 03/02/23 0355  AST 25 25  ALT 12 11  ALKPHOS 38 35*  BILITOT 1.8* 1.0  PROT 6.6 6.0*  ALBUMIN 3.6 3.0*    Discharge time spent: greater than 30 minutes.  Signed:  Baldwin Jamaica, MD [facility] 03/03/2023

## 2023-03-03 NOTE — TOC CM/SW Note (Signed)
Transition of Care [facility]) - Inpatient Brief Assessment   Patient Details  Name: [name] MRN: [id number] Date of Birth: 09-28-1929  Transition of Care [facility] CM/SW Contact:    Garret Reddish, RN Phone Number: 03/03/2023, 11:17 AM   Clinical Narrative:  Chart reviewed.  I have meet with patient and her daughter Eunice Blase at bedside today.  Patient reports that prior to admission she was independent of ADL's.  She did not use any assistive devices to get around. Patient reports that she was still driving. Patient was driving herself to appointments.   Noted PT recommends no PT follow-up.    No TOC need identified.    Transition of Care Asessment: Insurance and Status: Insurance coverage has been reviewed Patient has primary care physician: Yes Nemiah Commander, Radhika) Home environment has been reviewed: Reviewed.  Patient lives by herself.  Has supportive daughter Prior level of function:: Independent prior to admission Prior/Current Home Services: No current home services Social Determinants of Health Reivew: SDOH reviewed no interventions necessary Readmission risk has been reviewed: Yes Transition of care needs: no transition of care needs at this time

## 2023-03-03 NOTE — Plan of Care (Signed)
  Problem: Education: Goal: Knowledge of General Education information will improve Description: Including pain rating scale, medication(s)/side effects and non-pharmacologic comfort measures Outcome: Progressing   Problem: Health Behavior/Discharge Planning: Goal: Ability to manage health-related needs will improve Outcome: Progressing   Problem: Clinical Measurements: Goal: Ability to maintain clinical measurements within normal limits will improve Outcome: Progressing Goal: Diagnostic test results will improve Outcome: Progressing   Problem: Activity: Goal: Risk for activity intolerance will decrease Outcome: Progressing   Problem: Nutrition: Goal: Adequate nutrition will be maintained Outcome: Progressing   Problem: Coping: Goal: Level of anxiety will decrease Outcome: Progressing   Problem: Elimination: Goal: Will not experience complications related to urinary retention Outcome: Progressing   Problem: Pain Managment: Goal: General experience of comfort will improve Outcome: Progressing   Problem: Safety: Goal: Ability to remain free from injury will improve Outcome: Progressing   Problem: Skin Integrity: Goal: Risk for impaired skin integrity will decrease Outcome: Progressing

## 2023-03-03 NOTE — Plan of Care (Signed)

## 2023-03-03 NOTE — Progress Notes (Signed)
Mobility Specialist - Progress Note   03/03/23 0937  Mobility  Activity Ambulated independently in hallway;Stood at bedside;Dangled on edge of bed  Level of Assistance Independent  Assistive Device None  Distance Ambulated (ft) 800 ft  Activity Response Tolerated well  Mobility Referral Yes  $Mobility charge 1 Mobility  Mobility Specialist Start Time (ACUTE ONLY) 0905  Mobility Specialist Stop Time (ACUTE ONLY) 0921  Mobility Specialist Time Calculation (min) (ACUTE ONLY) 16 min   Pt supine in bed on RA upon arrival. Pt completes bed mobilitiy, STS, and ambulates 5 laps around NS indep with no LOB noted. Pt returns to bed with needs in reach and daughter in room.   Terrilyn Saver  Mobility Specialist  03/03/23 9:38 AM

## 2023-03-04 ENCOUNTER — Ambulatory Visit: Payer: Medicare Other | Admitting: Dermatology

## 2023-03-11 NOTE — Telephone Encounter (Signed)
Patient has appt scheduled for 9/5.

## 2023-03-19 DIAGNOSIS — S065XAD Traumatic subdural hemorrhage with loss of consciousness status unknown, subsequent encounter: Secondary | ICD-10-CM | POA: Diagnosis not present

## 2023-03-19 DIAGNOSIS — M353 Polymyalgia rheumatica: Secondary | ICD-10-CM | POA: Diagnosis not present

## 2023-03-19 DIAGNOSIS — N390 Urinary tract infection, site not specified: Secondary | ICD-10-CM | POA: Diagnosis not present

## 2023-03-19 DIAGNOSIS — Z09 Encounter for follow-up examination after completed treatment for conditions other than malignant neoplasm: Secondary | ICD-10-CM | POA: Diagnosis not present

## 2023-03-19 DIAGNOSIS — Z0000 Encounter for general adult medical examination without abnormal findings: Secondary | ICD-10-CM | POA: Diagnosis not present

## 2023-03-19 DIAGNOSIS — I10 Essential (primary) hypertension: Secondary | ICD-10-CM | POA: Diagnosis not present

## 2023-03-19 DIAGNOSIS — R8290 Unspecified abnormal findings in urine: Secondary | ICD-10-CM | POA: Diagnosis not present

## 2023-04-03 NOTE — Progress Notes (Unsigned)
Cardiology Office Note    Date:  04/04/2023   ID:  Kathryn Wise, DOB 1930/04/24, MRN [id number]  PCP:  Enid Baas, MD  Cardiologist:  Yvonne Kendall, MD  Electrophysiologist:  None   Chief Complaint: Hospital follow-up  History of Present Illness:   Kathryn Wise is a 89 y.o. female with history of nonobstructive CAD, HOCM, HFpEF, difficult to control hypertension with left renal artery stenosis status post stenting in 12/2018, carotid artery disease status post left sided stenting in 09/2020, possible small SDH noted in 10/2021, LBBB, HLD, and mitral regurgitation who presents for hospital follow-up.  Kathryn Wise was previously followed by Dr. Alvino Chapel in 2017 for cardiac murmur with echo at that time demonstrating an EF of 60 to 65%, normal wall motion, mild focal basal and mild concentric LVH of the septum with mild LVOT gradient, grade 1 diastolic dysfunction, mildly dilated aortic root and ascending aorta, mild mitral regurgitation, normal RV systolic function and PASP.  She was admitted for chest pain in 05/2017 with Lexiscan being abnormal.  She subsequently underwent LHC in 05/2017, which demonstrated nonobstructive CAD involving the LCx and RCA with medical management recommended.  Echo in 05/2017 showed an EF of 55 to 60%, normal wall motion, mild focal basal and mild concentric LVH, near cavity obliteration in systole, grade 1 diastolic dysfunction, very mild aortic stenosis, moderate mitral regurgitation, mildly dilated left atrium, normal RV systolic function, and a PASP of 50 mmHg.  Following this, she was lost to follow-up.   She was admitted to the hospital in 09/2020 with pneumonia and HFpEF.  Echo demonstrated an EF of 55 to 60%, moderate LVH, grade 2 diastolic dysfunction with moderate LVOT obstruction with a peak gradient of 68 mmHg at rest and 74 mmHg with Valsalva, normal RV systolic function and ventricular cavity size, moderately elevated PASP estimated at 46.5 mmHg,  moderately dilated left atrium, mild mitral regurgitation, mild mitral stenosis, moderate mitral annular calcification, and mild to moderate aortic valve sclerosis without evidence of stenosis.  High-sensitivity troponin trended to 976.  She was lost to follow-up.   She was admitted in 09/2021 with hypertensive urgency with noted improvement in BP following 10 mg of amlodipine and 5 mg of IV labetalol.  She was noted to have a mildly elevated high-sensitivity troponin peaking at 36 felt to be related to supply demand ischemia from hypertension.   She was admitted to the hospital in 10/2021 noting left upper extremity numbness that was transient, bilateral lower extremity swelling, and generalized weakness.  She was consulted on by neurosurgery for possible TIA.  MRI of the brain showed a possible 2 mm subdural hematoma overlying the right cerebral convexity.  This was not well visualized on CT imaging.  She was unaware of any fall/trauma.  There was recommended the patient continue aspirin without need for antiepileptic medication given lack of subdural hematoma noted on CT imaging.  Echo, as read by outside group, showed an EF of 50 to 55%, no regional wall motion normalities, mild concentric LVH, grade 1 diastolic dysfunction, normal RV systolic function and ventricular cavity size, myxomatous mitral valve with mild stenosis, and mild aortic insufficiency.  Lower extremity ultrasound was negative for DVT bilaterally.  High-sensitivity troponin 28 with a delta troponin of 29.  BNP 418.   She was seen in hospital follow-up on 11/20/2021 and was without symptoms of angina or decompensation at that time.  She did report an approximate 4-week history of left jaw pain, and  in the setting had multiple teeth extracted along that side without symptomatic improvement.  This discomfort was exacerbated by exertion, chewing, and yawning.  Subsequent Lexiscan MPI on 11/27/2021 was overall low risk without evidence of  infarction or ischemia.  CT attenuated corrected images showed mild coronary artery calcification and aortic atherosclerosis.  Echo on 12/07/2021 demonstrated an EF of 60 to 65%, no regional wall motion abnormalities, moderate LVH with severe basal septal hypertrophy with an LVOT gradient of 39 mmHg at rest and 52 mmHg with Valsalva with one measurement of 127 mmHg, normal RV systolic function and ventricular cavity size, mildly dilated left atrium, moderate mitral valve regurgitation with severe mitral annular calcification, and aortic valve sclerosis without evidence of stenosis.   She was seen in the office on 12/28/2021 and was without symptoms of angina or decompensation.  She did feel somewhat deconditioned as she had not been able to exercise regularly.  She was without symptoms of dizziness, presyncope, or syncope.  Her blood pressure was improved following the addition of Cardizem CD.  We subsequently titrated this to 240 mg daily.  She subsequently reported issues with pain all over, noting that slight "changes" led to a flareup of polymyalgia.  Given this, it was recommended she resume lower dose Cardizem 180 mg daily.  She subsequently discontinued this medication altogether indicating she was "feeling poorly."  She was later diagnosed with a sinus infection.  We recommended she resume Cardizem 180 mg daily due to elevated BP.   She was seen in the office in 01/2022 noting diffuse myalgias and limited mobility while on diltiazem.  She underwent intermittent washing out of this medication with improvement in symptoms while she was off diltiazem.  However, she did continue to take diltiazem with intermittent elevated blood pressure readings.  She requested a prednisone taper for her myalgias.  We transitioned her to low-dose carvedilol 3.125 mg twice daily.  She subsequently notified our office in 02/2022 that carvedilol was working well for her blood pressure, though causing fatigue and weakness leading it  to be discontinued.  She was placed back on amlodipine 10 mg daily, which she previously tolerated without issues.     She was seen in the office in 03/2022 noting an increase in bilateral lower extremity swelling that began after reinitiating amlodipine.  She noticed an improvement in her energy following discontinuation of carvedilol.  She preferred to remain on amlodipine 10 mg daily.  She was started on furosemide 20 mg daily with follow-up labs showing stable renal function.   She was seen by her PCP 08/17/2022 for elevated blood pressure and placed on hydralazine.  While at the hairdresser, later that day, while sitting on the hair dryer, she developed sudden onset of tachypalpitations with associated dizziness and near syncope.  No frank syncope.  Symptoms lasted a few seconds and spontaneously resolved.  She drove herself home.  While at home, she had another episode of tachypalpitations associated near syncope that persisted a little longer prompting her to contact EMS.  She never had chest pain or frank syncope.  She was admitted to [facility] in 07/2022 with atrial tachycardia and near syncope in the setting of HOCM.  High-sensitivity troponin peaked at 305 and was felt to represent demand ischemia in the setting of atrial tachycardia with known HOCM.  BNP 710.  With initiation of bisoprolol there was documented improvement in tachy-palpitation burden.  Patient discharged on bisoprolol 5 mg daily.   She was last seen in the office  in 07/2022 and had tolerated the addition of bisoprolol without issue.  She subsequently contacted our office concerned about asymptomatic heart rates in the 50s bpm.  In this setting, bisoprolol was reduced to 2.5 mg daily.   She was evaluated in the ED on 11/04/2022 with elevated BP readings with a BP of 219/95.  High-sensitivity troponin mildly elevated and flat trending peaking at 44.  She was given 5 mg of IV hydralazine.  She was last seen in the office on 11/30/2022  continuing to note fluctuations in her blood pressure ranging from the 120s to 190s systolic.  She did not believe as needed hydralazine had helped her much and that at times resumed as needed amlodipine 5 mg.  She preferred to transition back to amlodipine on an as needed basis.  She was taking bisoprolol 2.5 mg every other day.  Was admitted to the hospital in 02/2023 with acute left pyelonephritis with E. coli UTI.  High-sensitivity troponin trended to 511, which was felt to be related to supply/demand ischemia in the setting of hypertrophic cardiomyopathy and pyelonephritis.  At discharge, bisoprolol was titrated to 5 mg 2.5 daily, however patient preferred to remain on prior dose of bisoprolol 2.5 mg every other day.  She comes in today and is doing well from a cardiac perspective, no symptoms of angina or cardiac decompensation.  No dyspnea, palpitations, dizziness, presyncope, or syncope.  Chronic fatigue is stable.  Blood pressure at home has been well-controlled in the 1 teens to 130s systolic.  She remains adherent to bisoprolol 2.5 mg every other day.  She has not needed any as needed amlodipine.  No significant lower extremity swelling or progressive orthopnea.   Labs independently reviewed: 02/2023 - potassium 4.4, BUN 17, serum creatinine 0.9, albumin 4.2, AST/ALT normal, Hgb 9.9, PLT 145, magnesium 2.3, TSH 7.416, free T4 normal 11/2022 - A1c 5.9 10/2021 - TC 115, TG 91, HDL 42, LDL 55   Past Medical History:  Diagnosis Date   Arthritis    Collagen vascular disease (HCC)    Dysphagia, pharyngoesophageal phase 09/10/2013   Upper GI study with barium swallow was done at  [facility] Mar 2015   No reflux seen  Small irreducible hiatal hernia  Mild changes of presbyeophagus (abnormal contractions of the esophagus that occur with aging) No strictures Normal gastric emptying  Incomplete visualization of stomach fold due to patient's inability turn     GERD (gastroesophageal reflux disease)    Heart  murmur    has had years and years   History of shingles Dec 2013   treated with steroids , post op from shoulder surgery   History of squamous cell carcinoma 05/02/2016   right mid lateral pretibial   Hypertension    Hypothyroidism    Major depressive disorder, single episode 11/05/2015   Osteoarthritis of left shoulder 07/14/2012   Pneumonia 10/21/2020   Squamous cell carcinoma of skin 05/12/2016   R mid lat pretibial - other skin cancers treated by Dr. Jarold Motto   Squamous cell carcinoma of skin 08/22/2020   left distal medial popliteal - [facility]    Past Surgical History:  Procedure Laterality Date   ARTHOSCOPIC ROTAOR CUFF REPAIR     LEFT  10+  YEARS     CAROTID PTA/STENT INTERVENTION Left 10/19/2020   Procedure: CAROTID PTA/STENT INTERVENTION;  Surgeon: Renford Dills, MD;  Location: [facility] INVASIVE CV LAB;  Service: Cardiovascular;  Laterality: Left;   CATARACT EXTRACTION W/ INTRAOCULAR LENS IMPLANT  RIGHT EYE   CATARACT EXTRACTION W/PHACO Left 03/28/2016   Procedure: CATARACT EXTRACTION PHACO AND INTRAOCULAR LENS PLACEMENT (IOC);  Surgeon: Lockie Mola, MD;  Location: [facility];  Service: Ophthalmology;  Laterality: Left;  TORIC   FACELIFT     FOOT SURGERY     rt foot   TUMOR REMOVED   JOINT REPLACEMENT Left Dec 2013   shoulder   LEFT HEART CATH AND CORONARY ANGIOGRAPHY N/A 06/12/2017   Procedure: LEFT HEART CATH AND CORONARY ANGIOGRAPHY;  Surgeon: Antonieta Iba, MD;  Location: [facility] INVASIVE CV LAB;  Service: Cardiovascular;  Laterality: N/A;   RENAL ANGIOGRAPHY Left 01/07/2019   Procedure: RENAL ANGIOGRAPHY;  Surgeon: Renford Dills, MD;  Location: [facility] INVASIVE CV LAB;  Service: Cardiovascular;  Laterality: Left;   RENAL ARTERY STENT     SKIN CANCER EXCISION     TOTAL SHOULDER ARTHROPLASTY  07/14/2012   Procedure: TOTAL SHOULDER ARTHROPLASTY;  Surgeon: Eulas Post, MD;  Location: [facility] OR;  Service: Orthopedics;  Laterality: Left;    Current  Medications: Current Meds  Medication Sig   amLODipine (NORVASC) 5 MG tablet Take 1 tablet (5 mg total) by mouth as needed (Once daily AS NEEDED for SBP (top blood pressure number) is greater than 160.).   aspirin EC 81 MG tablet Take 81 mg by mouth daily.   Cholecalciferol (VITAMIN D-3) 1000 units CAPS Take 1,000 Units by mouth daily.   cyanocobalamin (VITAMIN B12) 1000 MCG tablet Take 1,000 mcg by mouth daily.   levothyroxine (SYNTHROID) 88 MCG tablet TAKE 1 TABLET(88 MCG) BY MOUTH DAILY BEFORE BREAKFAST (Patient taking differently: Take 75 mcg by mouth every evening.)   nitroGLYCERIN (NITROSTAT) 0.4 MG SL tablet Place 1 tablet (0.4 mg total) under the tongue every 5 (five) minutes as needed for chest pain.   omeprazole (PRILOSEC) 20 MG capsule TAKE 1 CAPSULE(20 MG) BY MOUTH EVERY MORNING AS NEEDED FOR HEARTBURN (Patient taking differently: Take 20 mg by mouth in the morning.)   rosuvastatin (CRESTOR) 10 MG tablet Take 1 tablet (10 mg total) by mouth at bedtime.   temazepam (RESTORIL) 15 MG capsule TAKE 1 CAPSULE BY MOUTH AT BEDTIME AS NEEDED SLEEP (Patient taking differently: Take 15 mg by mouth at bedtime.)   Turmeric 500 MG CAPS Take 500 mg by mouth daily.   [DISCONTINUED] bisoprolol (ZEBETA) 5 MG tablet Take 1 tablet (5 mg total) by mouth every other day. Take 0.5 tablet (2.5 mg) by mouth once daily    Allergies:   Alendronate, Codeine, Lisinopril, Metoprolol, Oxycodone, and Oxycontin [oxycodone hcl]   Social History   Socioeconomic History   Marital status: Widowed    Spouse name: Not on file   Number of children: Not on file   Years of education: Not on file   Highest education level: Not on file  Occupational History   Not on file  Tobacco Use   Smoking status: Former    Current packs/day: 0.00    Types: Cigarettes    Quit date: 07/30/1972    Years since quitting: 50.7   Smokeless tobacco: Never   Tobacco comments:    smoked for only a few months  Vaping Use   Vaping  status: Never Used  Substance and Sexual Activity   Alcohol use: Yes    Comment: OCCAS WINE    Drug use: No   Sexual activity: Never  Other Topics Concern   Not on file  Social History Narrative   Not on file  Social Determinants of Health   Financial Resource Strain: Low Risk  (03/19/2023)   Received from [facility]   Overall Financial Resource Strain (CARDIA)    Difficulty of Paying Living Expenses: Not hard at all  Food Insecurity: No Food Insecurity (03/19/2023)   Received [facility]   Hunger Vital Sign    Worried About Running Out of Food in the Last Year: Never true    Ran Out of Food in the Last Year: Never true  Transportation Needs: No Transportation Needs (03/19/2023)   Received from [facility] - Transportation    In the past 12 months, has lack of transportation kept you from medical appointments or from getting medications?: No    Lack of Transportation (Non-Medical): No  Physical Activity: Sufficiently Active (04/20/2020)   Exercise Vital Sign    Days of Exercise per Week: 4 days    Minutes of Exercise per Session: 60 min  Stress: No Stress Concern Present (04/20/2020)   [employer] of Occupational Health - Occupational Stress Questionnaire    Feeling of Stress : Not at all  Social Connections: Unknown (04/20/2020)   Social Connection and Isolation Panel [NHANES]    Frequency of Communication with Friends and Family: More than three times a week    Frequency of Social Gatherings with Friends and Family: Not on file    Attends Religious Services: Not on file    Active Member of Clubs or Organizations: Not on file    Attends Banker Meetings: Not on file    Marital Status: Not on file     Family History:  The patient's family history includes Breast cancer (age of onset: 68) in her daughter; Cancer in her daughter and son.  ROS:   12-point review of systems is negative  unless otherwise noted in the HPI.   EKGs/Labs/Other Studies Reviewed:    Studies reviewed were summarized above. The additional studies were reviewed today:  Limited echo 12/07/2021: 1. Left ventricular ejection fraction, by estimation, is 60 to 65%. The  left ventricle has normal function. The left ventricle has no regional  wall motion abnormalities. There is moderat   2. There is moderate left ventricular hypertrophy with severe basal  septal hypertrophy. LVOT gradient noted, 39 mm Hg at rest, up to 52 mm Hg  with valsalva. One measurement estimating gradient at 127 mm Hg.   3. Right ventricular systolic function is normal. The right ventricular  size is normal. Tricuspid regurgitation signal is inadequate for assessing  PA pressure.   4. Left atrial size was mildly dilated.   5. The mitral valve is normal in structure. Moderate mitral valve  regurgitation. Severe mitral annular calcification.   6. The aortic valve is normal in structure. Aortic valve regurgitation is  not visualized. Aortic valve sclerosis/calcification is present, without  any evidence of aortic stenosis.   Comparison(s): 11/13/21 echo measured LVOT Vmax as 156 cm/s. __________   Eugenie Birks MPI 11/27/2021:   The study is normal. The study is low risk.   No ST deviation was noted.   LV perfusion is normal. There is no evidence of ischemia. There is no evidence of infarction.   Left ventricular function is normal. End diastolic cavity size is normal. End systolic cavity size is normal.   Suboptimal study due to GI uptake.   CT attenuation images with mild aortic and coronary calcification. __________   2D echo 11/13/2021:  1. Left  ventricular ejection fraction, by estimation, is 50 to 55%. The  left ventricle has low normal function. The left ventricle has no regional  wall motion abnormalities. There is mild concentric left ventricular  hypertrophy. Left ventricular  diastolic parameters are consistent with  Grade I diastolic dysfunction  (impaired relaxation).   2. Right ventricular systolic function is normal. The right ventricular  size is normal.   3. The mitral valve is myxomatous. No evidence of mitral valve  regurgitation. Mild mitral stenosis.   4. The aortic valve is normal in structure. Aortic valve regurgitation is  mild. __________   2D echo 06/26/2017: - Left ventricle: The cavity size was normal. Near cavity    obliteration in systole. There was mild focal basal and mild    concentric hypertrophy of the septum. Systolic function was    normal. The estimated ejection fraction was in the range of 55%    to 60%. Wall motion was normal; there were no regional wall    motion abnormalities. Doppler parameters are consistent with    abnormal left ventricular relaxation (grade 1 diastolic    dysfunction).  - Aortic valve: Transvalvular velocity was increased. There was    very mild stenosis.  - Mitral valve: Calcified annulus. There was moderate    regurgitation.  - Left atrium: The atrium was mildly dilated.  - Right ventricle: Systolic function was normal.  - Pulmonary arteries: Systolic pressure was moderately elevated. PA    peak pressure: 50 mm Hg (S). __________  [facility] 06/12/2017: Coronary dominance: Right  Left mainstem:   Large vessel that bifurcates into the LAD and left circumflex, Ostial 30% disease, calcified.  Left anterior descending (LAD):   Large vessel that extends to the apical region, diagonal branch 2 of moderate size, mild lumenal irregularities  Left circumflex (LCx):  Large vessel with OM branch 2, moderate 50% mid vessel disease, calcified  Right coronary artery (RCA):  Right dominant vessel with PL and PDA, moderate focal disease, eccentric 50 to 60%  Left ventriculography: Left ventricular systolic function is normal/hyperdynamic, LVEF is estimated at 55-65%, there is no significant mitral regurgitation , no significant aortic valve  stenosis  Final Conclusions:   Moderate mid LCX and RCA disease, Medical management recommended Normal/hyperdynamic function  Recommendations:  Follow up on echocardiogram Start metoprolol tartrate 25 mg po BID __________   Eugenie Birks MPI 06/11/2017: Abnormal, potentially high risk myocardial perfusion stress test. There is a moderate in size, mild in severity, reversible defect involving the mid inferolateral, apical lateral, and apical segments consistent with ischemia. There is a small in size, mild in severity, reversible defect involving the mid anterior segment, which may represent subtle ischemia and/or shifting breast attenuation. The left ventricular ejection fraction is normal (64%). Transient ischemic dilation is noted (TID 1.31), which is non-specific but can be seen balanced/multivessel ischemia. __________   2D echo 12/05/2015: - Left ventricle: The cavity size was normal. There was mild focal    basal and mild concentric hypertrophy of the septum, with mild    LVOT gradient. Systolic function was normal. The estimated    ejection fraction was in the range of 60% to 65%. Wall motion was    normal; there were no regional wall motion abnormalities. Doppler    parameters are consistent with abnormal left ventricular    relaxation (grade 1 diastolic dysfunction).  - Aorta: Aortic root with milldy dilated, dimension: 35 mm (ED).    Ascending aorta was mildly dilated, diameter: 35 mm (S).  -  Mitral valve: There was mild regurgitation.  - Left atrium: The atrium was normal in size.  - Right ventricle: Systolic function was normal.  - Pulmonary arteries: Systolic pressure was within the normal    range.  - Inferior vena cava: The vessel was normal in size. The    respirophasic diameter changes were in the normal range (>= 50%),    consistent with normal central venous pressure.   Impressions:   - Murmur likely secondary to aortic valve sclerosis without    significant  stenosis, and mild LVOT gradient.   EKG:  EKG is ordered today.  The EKG ordered today demonstrates sinus bradycardia, 52 bpm, LBBB (known)  Recent Labs: 03/01/2023: B Natriuretic Peptide 1,726.1 03/02/2023: ALT 11; Magnesium 2.3; TSH 7.416 03/03/2023: BUN 19; Creatinine, Ser 0.88; Hemoglobin 9.9; Platelets 145; Potassium 4.0; Sodium 133  Recent Lipid Panel    Component Value Date/Time   CHOL 115 11/14/2021 0538   TRIG 91 11/14/2021 0538   HDL 42 11/14/2021 0538   CHOLHDL 2.7 11/14/2021 0538   VLDL 18 11/14/2021 0538   LDLCALC 55 11/14/2021 0538   LDLDIRECT 126.0 09/19/2016 1411    PHYSICAL EXAM:    VS:  BP (!) 168/78   Pulse (!) 52   Ht 5\' 5"  (1.651 m)   Wt 146 lb 9.6 oz (66.5 kg)   SpO2 94%   BMI 24.40 kg/m   BMI: Body mass index is 24.4 kg/m.  Physical Exam Vitals reviewed.  Constitutional:      Appearance: She is well-developed.  HENT:     Head: Normocephalic and atraumatic.  Eyes:     General:        Right eye: No discharge.        Left eye: No discharge.  Neck:     Vascular: No JVD.  Cardiovascular:     Rate and Rhythm: Regular rhythm. Bradycardia present.     Pulses:          Posterior tibial pulses are 2+ on the right side and 2+ on the left side.     Heart sounds: S1 normal and S2 normal. Heart sounds not distant. No midsystolic click and no opening snap. Murmur heard.     Systolic murmur is present with a grade of 2/6.     No friction rub.  Pulmonary:     Effort: Pulmonary effort is normal. No respiratory distress.     Breath sounds: Normal breath sounds. No decreased breath sounds, wheezing or rales.  Chest:     Chest wall: No tenderness.  Abdominal:     General: There is no distension.  Musculoskeletal:     Cervical back: Normal range of motion.     Right lower leg: No edema.     Left lower leg: No edema.  Skin:    General: Skin is warm and dry.     Nails: There is no clubbing.  Neurological:     Mental Status: She is alert and oriented to  person, place, and time.  Psychiatric:        Speech: Speech normal.        Behavior: Behavior normal.        Thought Content: Thought content normal.        Judgment: Judgment normal.     Wt Readings from Last 3 Encounters:  04/04/23 146 lb 9.6 oz (66.5 kg)  03/01/23 150 lb (68 kg)  02/18/23 150 lb (68 kg)     ASSESSMENT & PLAN:  HOCM with LBBB: Previously, she has noted intolerance to carvedilol, metoprolol, and diltiazem secondary to myalgias and fatigue. We have historically wanted to run her heart rates mildly bradycardic in the setting of underlying HOCM.  Continue bisoprolol 2.5 mg every other day as higher doses and daily dosing leads to increased fatigue and significant bradycardia.  She has previously indicated first-degree relatives have been screened.  Given advanced age and comorbid conditions, she has preferred to follow a conservative approach with declination for referral to HOCM clinic, cardiac MRI, or referral to EP.  No symptoms of near syncope or syncope.  Atrial tachycardia: Quiescent.  Continue bisoprolol 2.5 mg every other day.  Higher doses have led to significant bradycardia and increased fatigue.  Labile hypertension: Blood pressure is elevated in the office today, though she is under increased stress following issues with her car.  Blood pressure has been well-controlled at home.  Blood pressure improving on recheck in the office.  She has not needed any as needed amlodipine.  Continue bisoprolol as outlined above along with as needed amlodipine for systolic blood pressure greater than 160 mmHg.  Mitral valve regurgitation: Moderate and stable on echo in 11/2021.  Right renal artery stenosis: Status post right renal artery stenting with most recent ultrasound from 04/2022 showing patent right renal artery stent with no evidence of renal artery stenosis bilaterally.  By vascular surgery.    Disposition: F/u with Dr. Okey Dupre or an APP in 4 months.   Medication  Adjustments/Labs and Tests Ordered: Current medicines are reviewed at length with the patient today.  Concerns regarding medicines are outlined above. Medication changes, Labs and Tests ordered today are summarized above and listed in the Patient Instructions accessible in Encounters.   Signed, Eula Listen, PA-C 04/04/2023 12:52 PM     [facility] [place] [address] [place], [place] [address] [phone]

## 2023-04-04 ENCOUNTER — Telehealth: Payer: Self-pay | Admitting: Emergency Medicine

## 2023-04-04 ENCOUNTER — Encounter: Payer: Self-pay | Admitting: Physician Assistant

## 2023-04-04 ENCOUNTER — Ambulatory Visit: Payer: Medicare Other | Attending: Physician Assistant | Admitting: Physician Assistant

## 2023-04-04 VITALS — BP 168/78 | HR 52 | Ht 65.0 in | Wt 146.6 lb

## 2023-04-04 DIAGNOSIS — I447 Left bundle-branch block, unspecified: Secondary | ICD-10-CM

## 2023-04-04 DIAGNOSIS — I4719 Other supraventricular tachycardia: Secondary | ICD-10-CM | POA: Diagnosis not present

## 2023-04-04 DIAGNOSIS — I421 Obstructive hypertrophic cardiomyopathy: Secondary | ICD-10-CM

## 2023-04-04 DIAGNOSIS — I701 Atherosclerosis of renal artery: Secondary | ICD-10-CM

## 2023-04-04 DIAGNOSIS — I059 Rheumatic mitral valve disease, unspecified: Secondary | ICD-10-CM | POA: Diagnosis not present

## 2023-04-04 DIAGNOSIS — R0989 Other specified symptoms and signs involving the circulatory and respiratory systems: Secondary | ICD-10-CM

## 2023-04-04 MED ORDER — BISOPROLOL FUMARATE 5 MG PO TABS
2.5000 mg | ORAL_TABLET | ORAL | 3 refills | Status: DC
Start: 2023-04-04 — End: 2023-08-05

## 2023-04-04 NOTE — Patient Instructions (Signed)
Medication Instructions:  Your physician recommends the following medication changes. DECREASE: Bisoprolol 2.5 mg every other day  *If you need a refill on your cardiac medications before your next appointment, please call your pharmacy*   Lab Work: None If you have labs (blood work) drawn today and your tests are completely normal, you will receive your results only by: MyChart Message (if you have MyChart) OR A paper copy in the mail If you have any lab test that is abnormal or we need to change your treatment, we will call you to review the results.  Testing/Procedures: None  Follow-Up: At [facility], you and your health needs are our priority.  As part of our continuing mission to provide you with exceptional heart care, we have created designated Provider Care Teams.  These Care Teams include your primary Cardiologist (physician) and Advanced Practice Providers (APPs -  Physician Assistants and Nurse Practitioners) who all work together to provide you with the care you need, when you need it.  We recommend signing up for the patient portal called "MyChart".  Sign up information is provided on this After Visit Summary.  MyChart is used to connect with patients for Virtual Visits (Telemedicine).  Patients are able to view lab/test results, encounter notes, upcoming appointments, etc.  Non-urgent messages can be sent to your provider as well.   To learn more about what you can do with MyChart, go to [email or web address].    Your next appointment:   4 month(s)  Provider:   You may see Yvonne Kendall, MD or one of the following Advanced Practice Providers on your designated Care Team:   Eula Listen, [place]

## 2023-04-04 NOTE — Telephone Encounter (Signed)
Pt called because she left prior to receiving AVS, pt in car with family driving. AVS summarized for pt and reminded that this would be on MyChart if she and family wanted to review it.  Pt reports that follow-up appt already scheduled.  Pt thanked this Charity fundraiser for call.

## 2023-04-15 DIAGNOSIS — S92354A Nondisplaced fracture of fifth metatarsal bone, right foot, initial encounter for closed fracture: Secondary | ICD-10-CM | POA: Diagnosis not present

## 2023-05-03 NOTE — Progress Notes (Unsigned)
MRN : [id number]  Kathryn Wise is a 89 y.o. (1930/05/30) female who presents with chief complaint of check carotid arteries.  History of Present Illness:   The patient is seen for follow up evaluation of carotid stenosis status post left carotid stent on 10/19/2020.  There were no post operative problems or complications related to the surgery.  The patient denies groin pain.   Procedure: Placement of a 7 x 9 x 40 exact stent with the use of the NAV-6 embolic protection device in the left internal carotid artery   The patient denies interval amaurosis fugax. There is no recent history of TIA symptoms or focal motor deficits. There is no prior documented CVA.   The patient denies headache.   The patient is taking enteric-coated aspirin 81 mg daily.   The patient has a history of coronary artery disease, no recent episodes of angina or shortness of breath. The patient denies PAD or claudication symptoms. There is a history of hyperlipidemia which is being treated with a statin.    Studies done today: Carotid Duplex RICA 1-39% and LICA (stent) 1-39%   Renal duplex <50% stenosis bilaterally, normal resistive index and normal size kidnies bilaterally       No outpatient medications have been marked as taking for the 05/06/23 encounter (Appointment) with Gilda Crease, Latina Craver, MD.    Past Medical History:  Diagnosis Date   Arthritis    Collagen vascular disease (HCC)    Dysphagia, pharyngoesophageal phase 09/10/2013   Upper GI study with barium swallow was done at  [facility] Mar 2015   No reflux seen  Small irreducible hiatal hernia  Mild changes of presbyeophagus (abnormal contractions of the esophagus that occur with aging) No strictures Normal gastric emptying  Incomplete visualization of stomach fold due to patient's inability turn     GERD (gastroesophageal reflux disease)    Heart murmur    has had years and years   History of shingles Dec 2013   treated  with steroids , post op from shoulder surgery   History of squamous cell carcinoma 05/02/2016   right mid lateral pretibial   Hypertension    Hypothyroidism    Major depressive disorder, single episode 11/05/2015   Osteoarthritis of left shoulder 07/14/2012   Pneumonia 10/21/2020   Squamous cell carcinoma of skin 05/12/2016   R mid lat pretibial - other skin cancers treated by Dr. Jarold Motto   Squamous cell carcinoma of skin 08/22/2020   left distal medial popliteal - [facility]    Past Surgical History:  Procedure Laterality Date   ARTHOSCOPIC ROTAOR CUFF REPAIR     LEFT  10+  YEARS     CAROTID PTA/STENT INTERVENTION Left 10/19/2020   Procedure: CAROTID PTA/STENT INTERVENTION;  Surgeon: Renford Dills, MD;  Location: [facility] INVASIVE CV LAB;  Service: Cardiovascular;  Laterality: Left;   CATARACT EXTRACTION W/ INTRAOCULAR LENS IMPLANT     RIGHT EYE   CATARACT EXTRACTION W/PHACO Left 03/28/2016   Procedure: CATARACT EXTRACTION PHACO AND INTRAOCULAR LENS PLACEMENT (IOC);  Surgeon: Lockie Mola, MD;  Location: [facility];  Service: Ophthalmology;  Laterality: Left;  TORIC   FACELIFT     FOOT SURGERY     rt foot   TUMOR REMOVED   JOINT REPLACEMENT Left Dec 2013   shoulder   LEFT HEART CATH AND CORONARY ANGIOGRAPHY N/A  06/12/2017   Procedure: LEFT HEART CATH AND CORONARY ANGIOGRAPHY;  Surgeon: Antonieta Iba, MD;  Location: [facility] INVASIVE CV LAB;  Service: Cardiovascular;  Laterality: N/A;   RENAL ANGIOGRAPHY Left 01/07/2019   Procedure: RENAL ANGIOGRAPHY;  Surgeon: Renford Dills, MD;  Location: [facility] INVASIVE CV LAB;  Service: Cardiovascular;  Laterality: Left;   RENAL ARTERY STENT     SKIN CANCER EXCISION     TOTAL SHOULDER ARTHROPLASTY  07/14/2012   Procedure: TOTAL SHOULDER ARTHROPLASTY;  Surgeon: Eulas Post, MD;  Location: [facility] OR;  Service: Orthopedics;  Laterality: Left;    Social History Social History   Tobacco Use   Smoking status: Former    Current  packs/day: 0.00    Types: Cigarettes    Quit date: 07/30/1972    Years since quitting: 50.7   Smokeless tobacco: Never   Tobacco comments:    smoked for only a few months  Vaping Use   Vaping status: Never Used  Substance Use Topics   Alcohol use: Yes    Comment: OCCAS WINE    Drug use: No    Family History Family History  Problem Relation Age of Onset   Cancer Daughter        Breast   Breast cancer Daughter 75   Cancer Son     Allergies  Allergen Reactions   Alendronate Swelling    And nausea     Codeine    Lisinopril     Spike in BP   Metoprolol Other (See Comments)    Lethargy - PT CURRENTLY TAKING    Oxycodone     Other reaction(s): Other (See Comments) Altered mental status   Oxycontin [Oxycodone Hcl]     Altered mental status     REVIEW OF SYSTEMS (Negative unless checked)  Constitutional: [] Weight loss  [] Fever  [] Chills Cardiac: [] Chest pain   [] Chest pressure   [] Palpitations   [] Shortness of breath when laying flat   [] Shortness of breath with exertion. Vascular:  [x] Pain in legs with walking   [] Pain in legs at rest  [] History of DVT   [] Phlebitis   [] Swelling in legs   [] Varicose veins   [] Non-healing ulcers Pulmonary:   [] Uses home oxygen   [] Productive cough   [] Hemoptysis   [] Wheeze  [] COPD   [] Asthma Neurologic:  [] Dizziness   [] Seizures   [] History of stroke   [] History of TIA  [] Aphasia   [] Vissual changes   [] Weakness or numbness in arm   [] Weakness or numbness in leg Musculoskeletal:   [] Joint swelling   [] Joint pain   [] Low back pain Hematologic:  [] Easy bruising  [] Easy bleeding   [] Hypercoagulable state   [] Anemic Gastrointestinal:  [] Diarrhea   [] Vomiting  [] Gastroesophageal reflux/heartburn   [] Difficulty swallowing. Genitourinary:  [] Chronic kidney disease   [] Difficult urination  [] Frequent urination   [] Blood in urine Skin:  [] Rashes   [] Ulcers  Psychological:  [] History of anxiety   []  History of major depression.  Physical  Examination  There were no vitals filed for this visit. There is no height or weight on file to calculate BMI. Gen: WD/WN, NAD Head: [employer]/AT, No temporalis wasting.  Ear/Nose/Throat: Hearing grossly intact, nares w/o erythema or drainage Eyes: PER, EOMI, sclera nonicteric.  Neck: Supple, no masses.  No bruit or JVD.  Pulmonary:  Good air movement, no audible wheezing, no use of accessory muscles.  Cardiac: RRR, normal S1, S2, no Murmurs. Vascular:  carotid bruit noted Vessel Right Left  Radial Palpable  Palpable  Carotid  Palpable  Palpable  Subclav  Palpable Palpable  Gastrointestinal: soft, non-distended. No guarding/no peritoneal signs.  Musculoskeletal: M/S 5/5 throughout.  No visible deformity.  Neurologic: CN 2-12 intact. Pain and light touch intact in extremities.  Symmetrical.  Speech is fluent. Motor exam as listed above. Psychiatric: Judgment intact, Mood & affect appropriate for pt's clinical situation. Dermatologic: No rashes or ulcers noted.  No changes consistent with cellulitis.   CBC Lab Results  Component Value Date   WBC 9.5 03/03/2023   HGB 9.9 (L) 03/03/2023   HCT 30.7 (L) 03/03/2023   MCV 86.7 03/03/2023   PLT 145 (L) 03/03/2023    BMET    Component Value Date/Time   NA 133 (L) 03/03/2023 0519   NA 138 06/07/2014 0000   K 4.0 03/03/2023 0519   CL 107 03/03/2023 0519   CO2 22 03/03/2023 0519   GLUCOSE 98 03/03/2023 0519   BUN 19 03/03/2023 0519   BUN 20 06/07/2014 0000   CREATININE 0.88 03/03/2023 0519   CREATININE 1.14 (H) 12/03/2018 1417   CALCIUM 8.2 (L) 03/03/2023 0519   GFRNONAA >60 03/03/2023 0519   GFRAA >60 08/14/2019 1536   CrCl cannot be calculated (Patient's most recent lab result is older than the maximum 21 days allowed.).  COAG Lab Results  Component Value Date   INR 1.0 11/13/2021   INR 1.1 10/21/2020   INR 1.0 08/14/2019    Radiology No results found.   Assessment/Plan 1. Carotid stenosis, symptomatic w/o infarct,  bilateral Recommend:   Given the patient's asymptomatic subcritical stenosis no further invasive testing or surgery at this time.   Duplex ultrasound shows 1-39% stenosis bilaterally.   Continue antiplatelet therapy as prescribed Continue management of CAD, HTN and Hyperlipidemia Healthy heart diet,  encouraged exercise at least 4 times per week Follow up in 12 months with duplex ultrasound and physical exam   - VAS US CAROTID; Future  2. Renal artery stenosis (HCC) Recommend:   BP today was acceptable Given patient's atherosclerosis and PAD optimal control of the patient's hypertension is important.   The patient's BP and noninvasive studies support the previous intervention is patent. No further intervention is indicated at this time.   Therefore the patient  will continue the current medications, no changes at this time.   The primary medical service will continue aggressive antihypertensive therapy as per the AHA guidelines   - VAS US RENAL ARTERY DUPLEX; Future  3. Lymphedema Recommend:   No surgery or intervention at this point in time.     I have reviewed my discussion with the patient regarding venous insufficiency and secondary lymph edema and why it  causes symptoms. I have discussed with the patient the chronic skin changes that accompany these problems and the long term sequela such as ulceration and infection.  Patient will continue wearing graduated compression on a daily basis a prescription, if needed, was given to the patient to keep this updated. The patient will  put the compression on first thing in the morning and removing them in the evening. The patient is instructed specifically not to sleep in the compression.  In addition, behavioral modification including elevation during the day will be continued.  Diet and salt restriction will also be helpful.   Previous duplex ultrasound of the lower extremities shows normal deep venous system, superficial reflux was  not present.   4. Renovascular hypertension Continue antihypertensive medications as already ordered, these medications have been reviewed and there  are no changes at this time.Continue antihypertensive medications as already ordered, these medications have been reviewed and there are no changes at this time.Continue antihypertensive medications as already ordered, these medications have been reviewed and there are no changes at this time.  Given her reasonable blood pressure no further invasive testing is indicated at this time  - VAS US RENAL ARTERY DUPLEX; Future  5. Paroxysmal atrial fibrillation with RVR (HCC) Continue antiarrhythmia medications as already ordered, these medications have been reviewed and there are no changes at this time.  Continue anticoagulation as ordered by Cardiology Service  6. NSTEMI (non-ST elevated myocardial infarction) (HCC) Continue cardiac and antihypertensive medications as already ordered and reviewed, no changes at this time.  Continue statin as ordered and reviewed, no changes at this time  Nitrates PRN for chest pain  7. Hyperlipidemia LDL goal <130 Continue statin as ordered and reviewed, no changes at this time    Levora Dredge, MD  05/03/2023 4:46 PM

## 2023-05-04 ENCOUNTER — Other Ambulatory Visit: Payer: Self-pay | Admitting: Physician Assistant

## 2023-05-04 DIAGNOSIS — I4719 Other supraventricular tachycardia: Secondary | ICD-10-CM

## 2023-05-06 ENCOUNTER — Other Ambulatory Visit (INDEPENDENT_AMBULATORY_CARE_PROVIDER_SITE_OTHER): Payer: Self-pay | Admitting: Vascular Surgery

## 2023-05-06 ENCOUNTER — Encounter (INDEPENDENT_AMBULATORY_CARE_PROVIDER_SITE_OTHER): Payer: Self-pay | Admitting: Vascular Surgery

## 2023-05-06 ENCOUNTER — Other Ambulatory Visit (INDEPENDENT_AMBULATORY_CARE_PROVIDER_SITE_OTHER): Payer: Medicare Other

## 2023-05-06 ENCOUNTER — Ambulatory Visit (INDEPENDENT_AMBULATORY_CARE_PROVIDER_SITE_OTHER): Payer: Medicare Other

## 2023-05-06 ENCOUNTER — Ambulatory Visit (INDEPENDENT_AMBULATORY_CARE_PROVIDER_SITE_OTHER): Payer: Medicare Other | Admitting: Vascular Surgery

## 2023-05-06 VITALS — BP 178/71 | HR 52 | Resp 18 | Ht 65.0 in | Wt 146.0 lb

## 2023-05-06 DIAGNOSIS — I6523 Occlusion and stenosis of bilateral carotid arteries: Secondary | ICD-10-CM

## 2023-05-06 DIAGNOSIS — I480 Paroxysmal atrial fibrillation: Secondary | ICD-10-CM | POA: Diagnosis not present

## 2023-05-06 DIAGNOSIS — I701 Atherosclerosis of renal artery: Secondary | ICD-10-CM

## 2023-05-06 DIAGNOSIS — I890 Lymphedema, not elsewhere classified: Secondary | ICD-10-CM | POA: Diagnosis not present

## 2023-05-06 DIAGNOSIS — I150 Renovascular hypertension: Secondary | ICD-10-CM

## 2023-05-06 DIAGNOSIS — I214 Non-ST elevation (NSTEMI) myocardial infarction: Secondary | ICD-10-CM

## 2023-05-06 DIAGNOSIS — E785 Hyperlipidemia, unspecified: Secondary | ICD-10-CM | POA: Diagnosis not present

## 2023-05-09 ENCOUNTER — Encounter (INDEPENDENT_AMBULATORY_CARE_PROVIDER_SITE_OTHER): Payer: Self-pay | Admitting: Vascular Surgery

## 2023-05-13 DIAGNOSIS — Z23 Encounter for immunization: Secondary | ICD-10-CM | POA: Diagnosis not present

## 2023-05-21 DIAGNOSIS — S92354A Nondisplaced fracture of fifth metatarsal bone, right foot, initial encounter for closed fracture: Secondary | ICD-10-CM | POA: Diagnosis not present

## 2023-05-24 ENCOUNTER — Other Ambulatory Visit: Payer: Self-pay | Admitting: Internal Medicine

## 2023-05-24 DIAGNOSIS — K219 Gastro-esophageal reflux disease without esophagitis: Secondary | ICD-10-CM

## 2023-06-17 ENCOUNTER — Other Ambulatory Visit: Payer: Self-pay | Admitting: Internal Medicine

## 2023-06-17 DIAGNOSIS — H43813 Vitreous degeneration, bilateral: Secondary | ICD-10-CM | POA: Diagnosis not present

## 2023-06-17 DIAGNOSIS — H04123 Dry eye syndrome of bilateral lacrimal glands: Secondary | ICD-10-CM | POA: Diagnosis not present

## 2023-06-17 DIAGNOSIS — K219 Gastro-esophageal reflux disease without esophagitis: Secondary | ICD-10-CM

## 2023-06-17 DIAGNOSIS — H35373 Puckering of macula, bilateral: Secondary | ICD-10-CM | POA: Diagnosis not present

## 2023-06-17 DIAGNOSIS — H538 Other visual disturbances: Secondary | ICD-10-CM | POA: Diagnosis not present

## 2023-07-02 NOTE — Progress Notes (Unsigned)
Cardiology Office Note    Date:  07/03/2023   ID:  Kathryn Wise, DOB [birth year], MRN [id number]  PCP:  Enid Baas, MD  Cardiologist:  Yvonne Kendall, MD  Electrophysiologist:  None   Chief Complaint: Follow up  History of Present Illness:   Kathryn Wise is a 89 y.o. female with history of nonobstructive CAD, HOCM, HFpEF, difficult to control hypertension with left renal artery stenosis status post stenting in 12/2018, carotid artery disease status post left sided stenting in 09/2020, possible small SDH noted in 10/2021, LBBB, HLD, and mitral regurgitation who presents for follow-up of HOCM.   [name] was previously followed by Dr. Alvino Chapel in 2017 for cardiac murmur with echo at that time demonstrating an EF of 60 to 65%, normal wall motion, mild focal basal and mild concentric LVH of the septum with mild LVOT gradient, grade 1 diastolic dysfunction, mildly dilated aortic root and ascending aorta, mild mitral regurgitation, normal RV systolic function and PASP.  She was admitted for chest pain in 05/2017 with Lexiscan being abnormal.  She subsequently underwent LHC in 05/2017, which demonstrated nonobstructive CAD involving the LCx and RCA with medical management recommended.  Echo in 05/2017 showed an EF of 55 to 60%, normal wall motion, mild focal basal and mild concentric LVH, near cavity obliteration in systole, grade 1 diastolic dysfunction, very mild aortic stenosis, moderate mitral regurgitation, mildly dilated left atrium, normal RV systolic function, and a PASP of 50 mmHg.  Following this, she was lost to follow-up.   She was admitted to the hospital in 09/2020 with pneumonia and HFpEF.  Echo demonstrated an EF of 55 to 60%, moderate LVH, grade 2 diastolic dysfunction with moderate LVOT obstruction with a peak gradient of 68 mmHg at rest and 74 mmHg with Valsalva, normal RV systolic function and ventricular cavity size, moderately elevated PASP estimated at 46.5 mmHg, moderately  dilated left atrium, mild mitral regurgitation, mild mitral stenosis, moderate mitral annular calcification, and mild to moderate aortic valve sclerosis without evidence of stenosis.  High-sensitivity troponin trended to 976.  She was lost to follow-up.   She was admitted in 09/2021 with hypertensive urgency with noted improvement in BP following 10 mg of amlodipine and 5 mg of IV labetalol.  She was noted to have a mildly elevated high-sensitivity troponin peaking at 36 felt to be related to supply demand ischemia from hypertension.   She was admitted to the hospital in 10/2021 noting left upper extremity numbness that was transient, bilateral lower extremity swelling, and generalized weakness.  She was consulted on by neurosurgery for possible TIA.  MRI of the brain showed a possible 2 mm subdural hematoma overlying the right cerebral convexity.  This was not well visualized on CT imaging.  She was unaware of any fall/trauma.  There was recommended the patient continue aspirin without need for antiepileptic medication given lack of subdural hematoma noted on CT imaging.  Echo, as read by outside group, showed an EF of 50 to 55%, no regional wall motion normalities, mild concentric LVH, grade 1 diastolic dysfunction, normal RV systolic function and ventricular cavity size, myxomatous mitral valve with mild stenosis, and mild aortic insufficiency.  Lower extremity ultrasound was negative for DVT bilaterally.  High-sensitivity troponin 28 with a delta troponin of 29.  BNP 418.   She was seen in hospital follow-up on 11/20/2021 and was without symptoms of angina or decompensation at that time.  She did report an approximate 4-week history of left jaw  pain, and in the setting had multiple teeth extracted along that side without symptomatic improvement.  This discomfort was exacerbated by exertion, chewing, and yawning.  Subsequent Lexiscan MPI on 11/27/2021 was overall low risk without evidence of infarction or  ischemia.  CT attenuated corrected images showed mild coronary artery calcification and aortic atherosclerosis.  Echo on 12/07/2021 demonstrated an EF of 60 to 65%, no regional wall motion abnormalities, moderate LVH with severe basal septal hypertrophy with an LVOT gradient of 39 mmHg at rest and 52 mmHg with Valsalva with one measurement of 127 mmHg, normal RV systolic function and ventricular cavity size, mildly dilated left atrium, moderate mitral valve regurgitation with severe mitral annular calcification, and aortic valve sclerosis without evidence of stenosis.   She was seen in the office on 12/28/2021 and was without symptoms of angina or decompensation.  She did feel somewhat deconditioned as she had not been able to exercise regularly.  She was without symptoms of dizziness, presyncope, or syncope.  Her blood pressure was improved following the addition of Cardizem CD.  We subsequently titrated this to 240 mg daily.  She subsequently reported issues with pain all over, noting that slight "changes" led to a flareup of polymyalgia.  Given this, it was recommended she resume lower dose Cardizem 180 mg daily.  She subsequently discontinued this medication altogether indicating she was "feeling poorly."  She was later diagnosed with a sinus infection.  We recommended she resume Cardizem 180 mg daily due to elevated BP.   She was seen in the office in 01/2022 noting diffuse myalgias and limited mobility while on diltiazem.  She underwent intermittent washing out of this medication with improvement in symptoms while she was off diltiazem.  However, she did continue to take diltiazem with intermittent elevated blood pressure readings.  She requested a prednisone taper for her myalgias.  We transitioned her to low-dose carvedilol 3.125 mg twice daily.  She subsequently notified our office in 02/2022 that carvedilol was working well for her blood pressure, though causing fatigue and weakness leading it to be  discontinued.  She was placed back on amlodipine 10 mg daily, which she previously tolerated without issues.     She was seen in the office in 03/2022 noting an increase in bilateral lower extremity swelling that began after reinitiating amlodipine.  She noticed an improvement in her energy following discontinuation of carvedilol.  She preferred to remain on amlodipine 10 mg daily.  She was started on furosemide 20 mg daily with follow-up labs showing stable renal function.   She was seen by her PCP 08/17/2022 for elevated blood pressure and placed on hydralazine.  While at the hairdresser, later that day, while sitting on the hair dryer, she developed sudden onset of tachypalpitations with associated dizziness and near syncope.  No frank syncope.  Symptoms lasted a few seconds and spontaneously resolved.  She drove herself home.  While at home, she had another episode of tachypalpitations associated near syncope that persisted a little longer prompting her to contact EMS.  She never had chest pain or frank syncope.  She was admitted to [facility] in 07/2022 with atrial tachycardia and near syncope in the setting of HOCM.  High-sensitivity troponin peaked at 305 and was felt to represent demand ischemia in the setting of atrial tachycardia with known HOCM.  BNP 710.  With initiation of bisoprolol there was documented improvement in tachy-palpitation burden.  Patient discharged on bisoprolol 5 mg daily.   She was last seen in  the office in 07/2022 and had tolerated the addition of bisoprolol without issue.  She subsequently contacted our office concerned about asymptomatic heart rates in the 50s bpm.  In this setting, bisoprolol was reduced to 2.5 mg daily.   She was evaluated in the ED on 11/04/2022 with elevated BP readings with a BP of 219/95.  High-sensitivity troponin mildly elevated and flat trending peaking at 44.  She was given 5 mg of IV hydralazine.   She was seen in the office on 11/30/2022 continuing to note  fluctuations in her blood pressure ranging from the 120s to 190s systolic.  She did not believe as needed hydralazine had helped her much and that at times resumed as needed amlodipine 5 mg.  She preferred to transition back to amlodipine on an as needed basis.  She was taking bisoprolol 2.5 mg every other day.   She was admitted to the hospital in 02/2023 with acute left pyelonephritis with E. coli UTI.  High-sensitivity troponin trended to 511, which was felt to be related to supply/demand ischemia in the setting of hypertrophic cardiomyopathy and pyelonephritis.  At discharge, bisoprolol was titrated to 5 mg 2.5 daily, however patient preferred to remain on prior dose of bisoprolol 2.5 mg every other day.  She was last seen in the office in 03/2023 and was without angina or cardiac decompensation.  Chronic fatigue was stable.  Blood pressure at home is well-controlled in the 1 teens to 130s.  She was taking bisoprolol 2.5 mg every other day and had not needed any as needed amlodipine.  She comes in doing well from a cardiac perspective and is without symptoms of angina or cardiac decompensation.  No dyspnea, palpitations, dizziness, presyncope, or syncope.  Remains active at baseline, swimming on a regular basis without cardiac limitation.  No palpitations.  Has not needed any in the ER and amlodipine or hydralazine.  Remains on bisoprolol 2.5 mg every other day.  Prefers to not make any changes at this time.   Labs independently reviewed: 02/2023 - potassium 4.4, BUN 17, serum creatinine 0.9, albumin 4.2, AST/ALT normal, Hgb 9.9, PLT 145, magnesium 2.3, TSH 7.416, free T4 normal 11/2022 - A1c 5.9 10/2021 - TC 115, TG 91, HDL 42, LDL 55  Past Medical History:  Diagnosis Date   Arthritis    Collagen vascular disease (HCC)    Dysphagia, pharyngoesophageal phase 09/10/2013   Upper GI study with barium swallow was done at  [facility] Mar 2015   No reflux seen  Small irreducible hiatal hernia  Mild changes of  presbyeophagus (abnormal contractions of the esophagus that occur with aging) No strictures Normal gastric emptying  Incomplete visualization of stomach fold due to patient's inability turn     GERD (gastroesophageal reflux disease)    Heart murmur    has had years and years   History of shingles Dec 2013   treated with steroids , post op from shoulder surgery   History of squamous cell carcinoma 05/02/2016   right mid lateral pretibial   Hypertension    Hypothyroidism    Major depressive disorder, single episode 11/05/2015   Osteoarthritis of left shoulder 07/14/2012   Pneumonia 10/21/2020   Squamous cell carcinoma of skin 05/12/2016   R mid lat pretibial - other skin cancers treated by Dr. Jarold Motto   Squamous cell carcinoma of skin 08/22/2020   left distal medial popliteal - [facility]    Past Surgical History:  Procedure Laterality Date   ARTHOSCOPIC ROTAOR CUFF REPAIR  LEFT  10+  YEARS     CAROTID PTA/STENT INTERVENTION Left 10/19/2020   Procedure: CAROTID PTA/STENT INTERVENTION;  Surgeon: Renford Dills, MD;  Location: [facility] INVASIVE CV LAB;  Service: Cardiovascular;  Laterality: Left;   CATARACT EXTRACTION W/ INTRAOCULAR LENS IMPLANT     RIGHT EYE   CATARACT EXTRACTION W/PHACO Left 03/28/2016   Procedure: CATARACT EXTRACTION PHACO AND INTRAOCULAR LENS PLACEMENT (IOC);  Surgeon: Lockie Mola, MD;  Location: [facility];  Service: Ophthalmology;  Laterality: Left;  TORIC   FACELIFT     FOOT SURGERY     rt foot   TUMOR REMOVED   JOINT REPLACEMENT Left Dec 2013   shoulder   LEFT HEART CATH AND CORONARY ANGIOGRAPHY N/A 06/12/2017   Procedure: LEFT HEART CATH AND CORONARY ANGIOGRAPHY;  Surgeon: Antonieta Iba, MD;  Location: [facility] INVASIVE CV LAB;  Service: Cardiovascular;  Laterality: N/A;   RENAL ANGIOGRAPHY Left 01/07/2019   Procedure: RENAL ANGIOGRAPHY;  Surgeon: Renford Dills, MD;  Location: [facility] INVASIVE CV LAB;  Service: Cardiovascular;  Laterality: Left;    RENAL ARTERY STENT     SKIN CANCER EXCISION     TOTAL SHOULDER ARTHROPLASTY  07/14/2012   Procedure: TOTAL SHOULDER ARTHROPLASTY;  Surgeon: Eulas Post, MD;  Location: [facility] OR;  Service: Orthopedics;  Laterality: Left;    Current Medications: Current Meds  Medication Sig   amLODipine (NORVASC) 5 MG tablet Take 1 tablet (5 mg total) by mouth as needed (Once daily AS NEEDED for SBP (top blood pressure number) is greater than 160.).   aspirin EC 81 MG tablet Take 81 mg by mouth daily.   bisoprolol (ZEBETA) 5 MG tablet Take 0.5 tablets (2.5 mg total) by mouth every other day.   Cholecalciferol (VITAMIN D-3) 1000 units CAPS Take 1,000 Units by mouth daily.   cyanocobalamin (VITAMIN B12) 1000 MCG tablet Take 1,000 mcg by mouth daily.   levothyroxine (SYNTHROID) 75 MCG tablet Take 75 mcg by mouth daily.   nitroGLYCERIN (NITROSTAT) 0.4 MG SL tablet Place 1 tablet (0.4 mg total) under the tongue every 5 (five) minutes as needed for chest pain.   omeprazole (PRILOSEC) 20 MG capsule TAKE 1 CAPSULE(20 MG) BY MOUTH EVERY MORNING AS NEEDED FOR HEARTBURN (Patient taking differently: Take 20 mg by mouth in the morning.)   rosuvastatin (CRESTOR) 10 MG tablet Take 1 tablet (10 mg total) by mouth at bedtime.   temazepam (RESTORIL) 15 MG capsule TAKE 1 CAPSULE BY MOUTH AT BEDTIME AS NEEDED SLEEP (Patient taking differently: Take 15 mg by mouth at bedtime.)   Turmeric 500 MG CAPS Take 500 mg by mouth daily.   XIIDRA 5 % SOLN Apply 1 drop to eye 2 (two) times daily.    Allergies:   Alendronate, Codeine, Lisinopril, Metoprolol, Oxycodone, and Oxycontin [oxycodone hcl]   Social History   Socioeconomic History   Marital status: Widowed    Spouse name: Not on file   Number of children: Not on file   Years of education: Not on file   Highest education level: Not on file  Occupational History   Not on file  Tobacco Use   Smoking status: Former    Current packs/day: 0.00    Types: Cigarettes    Quit  date: 07/30/1972    Years since quitting: 50.9   Smokeless tobacco: Never   Tobacco comments:    smoked for only a few months  Vaping Use   Vaping status: Never Used  Substance and Sexual Activity  Alcohol use: Yes    Comment: OCCAS WINE    Drug use: No   Sexual activity: Never  Other Topics Concern   Not on file  Social History Narrative   Not on file   Social Determinants of Health   Financial Resource Strain: Low Risk  (03/19/2023)   Received from [facility]   Overall Financial Resource Strain (CARDIA)    Difficulty of Paying Living Expenses: Not hard at all  Food Insecurity: No Food Insecurity (03/19/2023)   Received from [facility]   Hunger Vital Sign    Worried About Running Out of Food in the Last Year: Never true    Ran Out of Food in the Last Year: Never true  Transportation Needs: No Transportation Needs (03/19/2023)   Received from [facility] - Transportation    In the past 12 months, has lack of transportation kept you from medical appointments or from getting medications?: No    Lack of Transportation (Non-Medical): No  Physical Activity: Sufficiently Active (04/20/2020)   Exercise Vital Sign    Days of Exercise per Week: 4 days    Minutes of Exercise per Session: 60 min  Stress: No Stress Concern Present (04/20/2020)   [employer] of Occupational Health - Occupational Stress Questionnaire    Feeling of Stress : Not at all  Social Connections: Unknown (04/20/2020)   Social Connection and Isolation Panel [NHANES]    Frequency of Communication with Friends and Family: More than three times a week    Frequency of Social Gatherings with Friends and Family: Not on file    Attends Religious Services: Not on file    Active Member of Clubs or Organizations: Not on file    Attends Banker Meetings: Not on file    Marital Status: Not on file     Family History:  The patient's family  history includes Breast cancer (age of onset: 3) in her daughter; Cancer in her daughter and son.  ROS:   12-point review of systems is negative unless otherwise noted in the HPI.   EKGs/Labs/Other Studies Reviewed:    Studies reviewed were summarized above. The additional studies were reviewed today:  Limited echo 12/07/2021: 1. Left ventricular ejection fraction, by estimation, is 60 to 65%. The  left ventricle has normal function. The left ventricle has no regional  wall motion abnormalities. There is moderat   2. There is moderate left ventricular hypertrophy with severe basal  septal hypertrophy. LVOT gradient noted, 39 mm Hg at rest, up to 52 mm Hg  with valsalva. One measurement estimating gradient at 127 mm Hg.   3. Right ventricular systolic function is normal. The right ventricular  size is normal. Tricuspid regurgitation signal is inadequate for assessing  PA pressure.   4. Left atrial size was mildly dilated.   5. The mitral valve is normal in structure. Moderate mitral valve  regurgitation. Severe mitral annular calcification.   6. The aortic valve is normal in structure. Aortic valve regurgitation is  not visualized. Aortic valve sclerosis/calcification is present, without  any evidence of aortic stenosis.   Comparison(s): 11/13/21 echo measured LVOT Vmax as 156 cm/s. __________   Eugenie Birks MPI 11/27/2021:   The study is normal. The study is low risk.   No ST deviation was noted.   LV perfusion is normal. There is no evidence of ischemia. There is no evidence of infarction.   Left ventricular function is normal.  End diastolic cavity size is normal. End systolic cavity size is normal.   Suboptimal study due to GI uptake.   CT attenuation images with mild aortic and coronary calcification. __________   2D echo 11/13/2021:  1. Left ventricular ejection fraction, by estimation, is 50 to 55%. The  left ventricle has low normal function. The left ventricle has no regional   wall motion abnormalities. There is mild concentric left ventricular  hypertrophy. Left ventricular  diastolic parameters are consistent with Grade I diastolic dysfunction  (impaired relaxation).   2. Right ventricular systolic function is normal. The right ventricular  size is normal.   3. The mitral valve is myxomatous. No evidence of mitral valve  regurgitation. Mild mitral stenosis.   4. The aortic valve is normal in structure. Aortic valve regurgitation is  mild. __________   2D echo 06/26/2017: - Left ventricle: The cavity size was normal. Near cavity    obliteration in systole. There was mild focal basal and mild    concentric hypertrophy of the septum. Systolic function was    normal. The estimated ejection fraction was in the range of 55%    to 60%. Wall motion was normal; there were no regional wall    motion abnormalities. Doppler parameters are consistent with    abnormal left ventricular relaxation (grade 1 diastolic    dysfunction).  - Aortic valve: Transvalvular velocity was increased. There was    very mild stenosis.  - Mitral valve: Calcified annulus. There was moderate    regurgitation.  - Left atrium: The atrium was mildly dilated.  - Right ventricle: Systolic function was normal.  - Pulmonary arteries: Systolic pressure was moderately elevated. PA    peak pressure: 50 mm Hg (S). __________  [facility] 06/12/2017: Coronary dominance: Right  Left mainstem:   Large vessel that bifurcates into the LAD and left circumflex, Ostial 30% disease, calcified.  Left anterior descending (LAD):   Large vessel that extends to the apical region, diagonal branch 2 of moderate size, mild lumenal irregularities  Left circumflex (LCx):  Large vessel with OM branch 2, moderate 50% mid vessel disease, calcified  Right coronary artery (RCA):  Right dominant vessel with PL and PDA, moderate focal disease, eccentric 50 to 60%  Left ventriculography: Left ventricular systolic  function is normal/hyperdynamic, LVEF is estimated at 55-65%, there is no significant mitral regurgitation , no significant aortic valve stenosis  Final Conclusions:   Moderate mid LCX and RCA disease, Medical management recommended Normal/hyperdynamic function  Recommendations:  Follow up on echocardiogram Start metoprolol tartrate 25 mg po BID __________   Eugenie Birks MPI 06/11/2017: Abnormal, potentially high risk myocardial perfusion stress test. There is a moderate in size, mild in severity, reversible defect involving the mid inferolateral, apical lateral, and apical segments consistent with ischemia. There is a small in size, mild in severity, reversible defect involving the mid anterior segment, which may represent subtle ischemia and/or shifting breast attenuation. The left ventricular ejection fraction is normal (64%). Transient ischemic dilation is noted (TID 1.31), which is non-specific but can be seen balanced/multivessel ischemia. __________   2D echo 12/05/2015: - Left ventricle: The cavity size was normal. There was mild focal    basal and mild concentric hypertrophy of the septum, with mild    LVOT gradient. Systolic function was normal. The estimated    ejection fraction was in the range of 60% to 65%. Wall motion was    normal; there were no regional wall motion abnormalities. Doppler  parameters are consistent with abnormal left ventricular    relaxation (grade 1 diastolic dysfunction).  - Aorta: Aortic root with milldy dilated, dimension: 35 mm (ED).    Ascending aorta was mildly dilated, diameter: 35 mm (S).  - Mitral valve: There was mild regurgitation.  - Left atrium: The atrium was normal in size.  - Right ventricle: Systolic function was normal.  - Pulmonary arteries: Systolic pressure was within the normal    range.  - Inferior vena cava: The vessel was normal in size. The    respirophasic diameter changes were in the normal range (>= 50%),    consistent  with normal central venous pressure.   Impressions:   - Murmur likely secondary to aortic valve sclerosis without    significant stenosis, and mild LVOT gradient.   EKG:  EKG is ordered today.  The EKG ordered today demonstrates sinus bradycardia, 55 bpm, LBBB  Recent Labs: 03/01/2023: B Natriuretic Peptide 1,726.1 03/02/2023: ALT 11; Magnesium 2.3; TSH 7.416 03/03/2023: BUN 19; Creatinine, Ser 0.88; Hemoglobin 9.9; Platelets 145; Potassium 4.0; Sodium 133  Recent Lipid Panel    Component Value Date/Time   CHOL 115 11/14/2021 0538   TRIG 91 11/14/2021 0538   HDL 42 11/14/2021 0538   CHOLHDL 2.7 11/14/2021 0538   VLDL 18 11/14/2021 0538   LDLCALC 55 11/14/2021 0538   LDLDIRECT 126.0 09/19/2016 1411    PHYSICAL EXAM:    VS:  BP 130/60 (BP Location: Left Arm, Patient Position: Sitting, Cuff Size: Normal)   Pulse (!) 55   Ht 5' 5.5" (1.664 m)   Wt 149 lb (67.6 kg)   SpO2 98%   BMI 24.42 kg/m   BMI: Body mass index is 24.42 kg/m.  Physical Exam Vitals reviewed.  Constitutional:      Appearance: She is well-developed.  HENT:     Head: Normocephalic and atraumatic.  Eyes:     General:        Right eye: No discharge.        Left eye: No discharge.  Neck:     Vascular: No JVD.  Cardiovascular:     Rate and Rhythm: Regular rhythm. Bradycardia present.     Heart sounds: S1 normal and S2 normal. Heart sounds not distant. No midsystolic click and no opening snap. Murmur heard.     Systolic murmur is present with a grade of 3/6.     No friction rub.  Pulmonary:     Effort: Pulmonary effort is normal. No respiratory distress.     Breath sounds: Normal breath sounds. No decreased breath sounds, wheezing, rhonchi or rales.  Chest:     Chest wall: No tenderness.  Abdominal:     General: There is no distension.  Musculoskeletal:     Cervical back: Normal range of motion.     Right lower leg: No edema.     Left lower leg: No edema.  Skin:    General: Skin is warm and dry.      Nails: There is no clubbing.  Neurological:     Mental Status: She is alert and oriented to person, place, and time.  Psychiatric:        Speech: Speech normal.        Behavior: Behavior normal.        Thought Content: Thought content normal.        Judgment: Judgment normal.     Wt Readings from Last 3 Encounters:  07/03/23 149 lb (67.6 kg)  05/06/23  146 lb (66.2 kg)  04/04/23 146 lb 9.6 oz (66.5 kg)     ASSESSMENT & PLAN:   HOCM with LBBB: Euvolemic and well compensated.  Previously, she has noted intolerance to carvedilol, metoprolol, and diltiazem secondary to myalgias and fatigue.  We have historically wanted to run her heart rates mildly bradycardic in the setting of underlying HOCM.  She has previously indicated first-degree relatives have been screened.  Given advanced age and comorbid conditions, she has preferred to follow a conservative approach with declination of referral to HOCM clinic, cardiac MRI, or referral to EP for consideration of ICD.  No symptoms of near syncope or syncope.  Continue bisoprolol 2.5 mg every other day as higher doses and daily dosing have led to increased fatigue and progressive bradycardia.  Atrial tachycardia: Quiescent.  Continue bisoprolol 2.5 mg every other day as outlined above.  Renovascular labile hypertension: Blood pressure overall stable and well-controlled in the office today.  Continue bisoprolol 2.5 mg every other day.  She has not needed any as needed amlodipine or hydralazine.  Mitral valve regurgitation: Moderate and stable on echo in 11/2021.  Obtain echo.  Renal artery stenosis/carotid artery stenosis: Status post right renal artery stenting with ultrasound from 04/2023 showed 1 to 59% bilateral renal artery stenosis.  Carotid artery ultrasound from 04/2023 showed 1 to 39% carotid artery stenosis bilaterally.  Followed by vascular surgery.     Disposition: F/u with Dr. Okey Dupre or an APP in 3 months.   Medication Adjustments/Labs  and Tests Ordered: Current medicines are reviewed at length with the patient today.  Concerns regarding medicines are outlined above. Medication changes, Labs and Tests ordered today are summarized above and listed in the Patient Instructions accessible in Encounters.   Signed, Eula Listen, PA-C 07/03/2023 4:44 PM     [facility] [place] [address] [place], [place] [address] [phone]

## 2023-07-03 ENCOUNTER — Ambulatory Visit: Payer: Medicare Other | Attending: Physician Assistant | Admitting: Physician Assistant

## 2023-07-03 ENCOUNTER — Encounter: Payer: Self-pay | Admitting: Physician Assistant

## 2023-07-03 VITALS — BP 130/60 | HR 55 | Ht 65.5 in | Wt 149.0 lb

## 2023-07-03 DIAGNOSIS — I701 Atherosclerosis of renal artery: Secondary | ICD-10-CM | POA: Diagnosis not present

## 2023-07-03 DIAGNOSIS — I447 Left bundle-branch block, unspecified: Secondary | ICD-10-CM | POA: Insufficient documentation

## 2023-07-03 DIAGNOSIS — R0989 Other specified symptoms and signs involving the circulatory and respiratory systems: Secondary | ICD-10-CM | POA: Insufficient documentation

## 2023-07-03 DIAGNOSIS — I059 Rheumatic mitral valve disease, unspecified: Secondary | ICD-10-CM | POA: Insufficient documentation

## 2023-07-03 DIAGNOSIS — I421 Obstructive hypertrophic cardiomyopathy: Secondary | ICD-10-CM | POA: Diagnosis not present

## 2023-07-03 DIAGNOSIS — I4719 Other supraventricular tachycardia: Secondary | ICD-10-CM | POA: Insufficient documentation

## 2023-07-03 NOTE — Patient Instructions (Signed)
Medication Instructions:  Your Physician recommend you continue on your current medication as directed.    *If you need a refill on your cardiac medications before your next appointment, please call your pharmacy*   Lab Work: None ordered at this time  If you have labs (blood work) drawn today and your tests are completely normal, you will receive your results only by: MyChart Message (if you have MyChart) OR A paper copy in the mail If you have any lab test that is abnormal or we need to change your treatment, we will call you to review the results.   Testing/Procedures: Your physician has requested that you have an echocardiogram. Echocardiography is a painless test that uses sound waves to create images of your heart. It provides your doctor with information about the size and shape of your heart and how well your heart's chambers and valves are working.   You may receive an ultrasound enhancing agent through an IV if needed to better visualize your heart during the echo. This procedure takes approximately one hour.  There are no restrictions for this procedure.  This will take place at [address] [facility]) [address], [place] [address]  Please note: We ask at that you not bring children with you during ultrasound (echo/ vascular) testing. Due to room size and safety concerns, children are not allowed in the ultrasound rooms during exams. Our front office staff cannot provide observation of children in our lobby area while testing is being conducted. An adult accompanying a patient to their appointment will only be allowed in the ultrasound room at the discretion of the ultrasound technician under special circumstances. We apologize for any inconvenience.    Follow-Up: At [facility], you and your health needs are our priority.  As part of our continuing mission to provide you with exceptional heart care, we have created designated Provider Care Teams.  These  Care Teams include your primary Cardiologist (physician) and Advanced Practice Providers (APPs -  Physician Assistants and Nurse Practitioners) who all work together to provide you with the care you need, when you need it.  We recommend signing up for the patient portal called "MyChart".  Sign up information is provided on this After Visit Summary.  MyChart is used to connect with patients for Virtual Visits (Telemedicine).  Patients are able to view lab/test results, encounter notes, upcoming appointments, etc.  Non-urgent messages can be sent to your provider as well.   To learn more about what you can do with MyChart, go to [email or web address].    Your next appointment:   3 month(s)  Provider:   You may see Yvonne Kendall, MD or one of the following Advanced Practice Providers on your designated Care Team:   Eula Listen, [place]

## 2023-07-29 ENCOUNTER — Other Ambulatory Visit: Payer: Medicare Other

## 2023-08-05 ENCOUNTER — Telehealth: Payer: Self-pay | Admitting: Physician Assistant

## 2023-08-05 ENCOUNTER — Ambulatory Visit: Payer: Medicare Other | Attending: Physician Assistant

## 2023-08-05 DIAGNOSIS — I421 Obstructive hypertrophic cardiomyopathy: Secondary | ICD-10-CM | POA: Insufficient documentation

## 2023-08-05 LAB — ECHOCARDIOGRAM COMPLETE
Area-P 1/2: 3.27 cm2
S' Lateral: 3.5 cm

## 2023-08-05 MED ORDER — BISOPROLOL FUMARATE 5 MG PO TABS: 5.0000 mg | ORAL_TABLET | Freq: Every day | ORAL | 3 refills | Status: DC

## 2023-08-05 MED ORDER — BISOPROLOL FUMARATE 5 MG PO TABS: 5.0000 mg | ORAL_TABLET | ORAL | 2 refills | Status: DC

## 2023-08-05 NOTE — Telephone Encounter (Signed)
 I apologize for any confusion. Our records indicated she was taking bisoprolol  2.5 mg every other day. If 5 mg every other day has been what she has been taking, I am ok with her being on 5 mg every other day as her vitals and symptoms have been stable with this.

## 2023-08-05 NOTE — Telephone Encounter (Signed)
Spoke with patient and reviewed provider recommendations. She verbalized understanding with no further questions at this time.  

## 2023-08-05 NOTE — Addendum Note (Signed)
 Addended by: Bryna Colander on: 08/05/2023 02:16 PM   Modules accepted: Orders

## 2023-08-05 NOTE — Telephone Encounter (Signed)
 Pt c/o medication issue:  1. Name of Medication:   bisoprolol  (ZEBETA ) 5 MG tablet    2. How are you currently taking this medication (dosage and times per day)?   Take 0.5 tablets (2.5 mg total) by mouth every other day.    3. Are you having a reaction (difficulty breathing--STAT)? No   4. What is your medication issue? Pt has questions about new dosage amount. Call mobile number

## 2023-08-05 NOTE — Telephone Encounter (Signed)
 Patient called in wanting to know who had changed her medication bisoprolol  or what she should be taking. She said that when she was in the hospital they changed the dose then and when she saw Bernardino last he was going to keep it the same. She reports taking bisoprolol  5 mg every other day and that is what works for her. This change was discovered when she ran out of her medication and they called us  for refill and prescription was changed. She wants to know what she should be taking. Advised that I would forward to provider for his review and recommendations.

## 2023-09-12 DIAGNOSIS — Z03818 Encounter for observation for suspected exposure to other biological agents ruled out: Secondary | ICD-10-CM | POA: Diagnosis not present

## 2023-09-12 DIAGNOSIS — J432 Centrilobular emphysema: Secondary | ICD-10-CM | POA: Diagnosis not present

## 2023-09-12 DIAGNOSIS — Z87891 Personal history of nicotine dependence: Secondary | ICD-10-CM | POA: Diagnosis not present

## 2023-09-12 DIAGNOSIS — J219 Acute bronchiolitis, unspecified: Secondary | ICD-10-CM | POA: Diagnosis not present

## 2023-09-12 DIAGNOSIS — J209 Acute bronchitis, unspecified: Secondary | ICD-10-CM | POA: Diagnosis not present

## 2023-09-13 DIAGNOSIS — E871 Hypo-osmolality and hyponatremia: Secondary | ICD-10-CM | POA: Diagnosis not present

## 2023-09-13 DIAGNOSIS — E860 Dehydration: Secondary | ICD-10-CM | POA: Diagnosis not present

## 2023-09-17 DIAGNOSIS — E871 Hypo-osmolality and hyponatremia: Secondary | ICD-10-CM | POA: Diagnosis not present

## 2023-09-17 DIAGNOSIS — E860 Dehydration: Secondary | ICD-10-CM | POA: Diagnosis not present

## 2023-09-22 ENCOUNTER — Emergency Department: Payer: Medicare Other

## 2023-09-22 ENCOUNTER — Encounter: Payer: Self-pay | Admitting: Emergency Medicine

## 2023-09-22 ENCOUNTER — Other Ambulatory Visit: Payer: Self-pay

## 2023-09-22 ENCOUNTER — Inpatient Hospital Stay
Admission: EM | Admit: 2023-09-22 | Discharge: 2023-09-24 | DRG: 812 | Disposition: A | Discharge status: Home Health | Payer: Medicare Other | Attending: Internal Medicine | Admitting: Internal Medicine

## 2023-09-22 DIAGNOSIS — S301XXA Contusion of abdominal wall, initial encounter: Secondary | ICD-10-CM | POA: Insufficient documentation

## 2023-09-22 DIAGNOSIS — Z96612 Presence of left artificial shoulder joint: Secondary | ICD-10-CM | POA: Diagnosis not present

## 2023-09-22 DIAGNOSIS — D649 Anemia, unspecified: Secondary | ICD-10-CM | POA: Diagnosis not present

## 2023-09-22 DIAGNOSIS — Z87891 Personal history of nicotine dependence: Secondary | ICD-10-CM

## 2023-09-22 DIAGNOSIS — K219 Gastro-esophageal reflux disease without esophagitis: Secondary | ICD-10-CM | POA: Diagnosis present

## 2023-09-22 DIAGNOSIS — R1032 Left lower quadrant pain: Principal | ICD-10-CM

## 2023-09-22 DIAGNOSIS — M7981 Nontraumatic hematoma of soft tissue: Secondary | ICD-10-CM | POA: Diagnosis present

## 2023-09-22 DIAGNOSIS — K449 Diaphragmatic hernia without obstruction or gangrene: Secondary | ICD-10-CM | POA: Diagnosis not present

## 2023-09-22 DIAGNOSIS — Z79899 Other long term (current) drug therapy: Secondary | ICD-10-CM

## 2023-09-22 DIAGNOSIS — I10 Essential (primary) hypertension: Secondary | ICD-10-CM | POA: Diagnosis present

## 2023-09-22 DIAGNOSIS — E785 Hyperlipidemia, unspecified: Secondary | ICD-10-CM | POA: Diagnosis present

## 2023-09-22 DIAGNOSIS — Z95828 Presence of other vascular implants and grafts: Secondary | ICD-10-CM | POA: Diagnosis not present

## 2023-09-22 DIAGNOSIS — I701 Atherosclerosis of renal artery: Secondary | ICD-10-CM | POA: Diagnosis present

## 2023-09-22 DIAGNOSIS — K8689 Other specified diseases of pancreas: Secondary | ICD-10-CM | POA: Diagnosis not present

## 2023-09-22 DIAGNOSIS — I951 Orthostatic hypotension: Secondary | ICD-10-CM | POA: Diagnosis not present

## 2023-09-22 DIAGNOSIS — M6289 Other specified disorders of muscle: Secondary | ICD-10-CM | POA: Diagnosis present

## 2023-09-22 DIAGNOSIS — Z66 Do not resuscitate: Secondary | ICD-10-CM | POA: Diagnosis present

## 2023-09-22 DIAGNOSIS — Z8619 Personal history of other infectious and parasitic diseases: Secondary | ICD-10-CM

## 2023-09-22 DIAGNOSIS — T380X5A Adverse effect of glucocorticoids and synthetic analogues, initial encounter: Secondary | ICD-10-CM | POA: Diagnosis present

## 2023-09-22 DIAGNOSIS — Z888 Allergy status to other drugs, medicaments and biological substances status: Secondary | ICD-10-CM

## 2023-09-22 DIAGNOSIS — R55 Syncope and collapse: Secondary | ICD-10-CM | POA: Diagnosis not present

## 2023-09-22 DIAGNOSIS — Z7982 Long term (current) use of aspirin: Secondary | ICD-10-CM | POA: Diagnosis not present

## 2023-09-22 DIAGNOSIS — K5900 Constipation, unspecified: Secondary | ICD-10-CM | POA: Diagnosis present

## 2023-09-22 DIAGNOSIS — Z803 Family history of malignant neoplasm of breast: Secondary | ICD-10-CM

## 2023-09-22 DIAGNOSIS — Z8701 Personal history of pneumonia (recurrent): Secondary | ICD-10-CM

## 2023-09-22 DIAGNOSIS — R339 Retention of urine, unspecified: Secondary | ICD-10-CM | POA: Diagnosis present

## 2023-09-22 DIAGNOSIS — I517 Cardiomegaly: Secondary | ICD-10-CM | POA: Diagnosis not present

## 2023-09-22 DIAGNOSIS — R1084 Generalized abdominal pain: Secondary | ICD-10-CM | POA: Diagnosis not present

## 2023-09-22 DIAGNOSIS — Z885 Allergy status to narcotic agent status: Secondary | ICD-10-CM | POA: Diagnosis not present

## 2023-09-22 DIAGNOSIS — D62 Acute posthemorrhagic anemia: Principal | ICD-10-CM | POA: Diagnosis present

## 2023-09-22 DIAGNOSIS — E039 Hypothyroidism, unspecified: Secondary | ICD-10-CM | POA: Diagnosis present

## 2023-09-22 DIAGNOSIS — S301XXD Contusion of abdominal wall, subsequent encounter: Secondary | ICD-10-CM | POA: Insufficient documentation

## 2023-09-22 DIAGNOSIS — I771 Stricture of artery: Secondary | ICD-10-CM | POA: Diagnosis not present

## 2023-09-22 DIAGNOSIS — Z85828 Personal history of other malignant neoplasm of skin: Secondary | ICD-10-CM

## 2023-09-22 DIAGNOSIS — R109 Unspecified abdominal pain: Secondary | ICD-10-CM | POA: Diagnosis not present

## 2023-09-22 DIAGNOSIS — R001 Bradycardia, unspecified: Secondary | ICD-10-CM | POA: Diagnosis not present

## 2023-09-22 DIAGNOSIS — I700 Atherosclerosis of aorta: Secondary | ICD-10-CM | POA: Diagnosis not present

## 2023-09-22 DIAGNOSIS — D72828 Other elevated white blood cell count: Secondary | ICD-10-CM | POA: Diagnosis present

## 2023-09-22 DIAGNOSIS — S0990XA Unspecified injury of head, initial encounter: Secondary | ICD-10-CM | POA: Diagnosis not present

## 2023-09-22 DIAGNOSIS — I6782 Cerebral ischemia: Secondary | ICD-10-CM | POA: Diagnosis not present

## 2023-09-22 DIAGNOSIS — T148XXA Other injury of unspecified body region, initial encounter: Secondary | ICD-10-CM

## 2023-09-22 LAB — CBC WITH DIFFERENTIAL/PLATELET
Abs Immature Granulocytes: 0.13 10*3/uL — ABNORMAL HIGH (ref 0.00–0.07)
Basophils Absolute: 0 10*3/uL (ref 0.0–0.1)
Basophils Relative: 0 %
Eosinophils Absolute: 0.3 10*3/uL (ref 0.0–0.5)
Eosinophils Relative: 2 %
HCT: 33.3 % — ABNORMAL LOW (ref 36.0–46.0)
Hemoglobin: 10.6 g/dL — ABNORMAL LOW (ref 12.0–15.0)
Immature Granulocytes: 1 %
Lymphocytes Relative: 36 %
Lymphs Abs: 6 10*3/uL — ABNORMAL HIGH (ref 0.7–4.0)
MCH: 26.7 pg (ref 26.0–34.0)
MCHC: 31.8 g/dL (ref 30.0–36.0)
MCV: 83.9 fL (ref 80.0–100.0)
Monocytes Absolute: 1.5 10*3/uL — ABNORMAL HIGH (ref 0.1–1.0)
Monocytes Relative: 9 %
Neutro Abs: 8.8 10*3/uL — ABNORMAL HIGH (ref 1.7–7.7)
Neutrophils Relative %: 52 %
Platelets: 274 10*3/uL (ref 150–400)
RBC: 3.97 MIL/uL (ref 3.87–5.11)
RDW: 15.9 % — ABNORMAL HIGH (ref 11.5–15.5)
Smear Review: NORMAL
WBC: 16.8 10*3/uL — ABNORMAL HIGH (ref 4.0–10.5)
nRBC: 0 % (ref 0.0–0.2)

## 2023-09-22 LAB — COMPREHENSIVE METABOLIC PANEL
ALT: 23 U/L (ref 0–44)
AST: 21 U/L (ref 15–41)
Albumin: 3.1 g/dL — ABNORMAL LOW (ref 3.5–5.0)
Alkaline Phosphatase: 45 U/L (ref 38–126)
Anion gap: 7 (ref 5–15)
BUN: 25 mg/dL — ABNORMAL HIGH (ref 8–23)
CO2: 24 mmol/L (ref 22–32)
Calcium: 8.8 mg/dL — ABNORMAL LOW (ref 8.9–10.3)
Chloride: 101 mmol/L (ref 98–111)
Creatinine, Ser: 0.93 mg/dL (ref 0.44–1.00)
GFR, Estimated: 58 mL/min — ABNORMAL LOW (ref >60)
Glucose, Bld: 115 mg/dL — ABNORMAL HIGH (ref 70–99)
Potassium: 4 mmol/L (ref 3.5–5.1)
Sodium: 132 mmol/L — ABNORMAL LOW (ref 135–145)
Total Bilirubin: 0.8 mg/dL (ref 0.0–1.2)
Total Protein: 6.3 g/dL — ABNORMAL LOW (ref 6.5–8.1)

## 2023-09-22 LAB — RESP PANEL BY RT-PCR (RSV, FLU A&B, COVID)  RVPGX2
Influenza A by PCR: NEGATIVE
Influenza B by PCR: NEGATIVE
Resp Syncytial Virus by PCR: NEGATIVE
SARS Coronavirus 2 by RT PCR: NEGATIVE

## 2023-09-22 LAB — HEMOGLOBIN AND HEMATOCRIT, BLOOD
HCT: 28 % — ABNORMAL LOW (ref 36.0–46.0)
HCT: 29.7 % — ABNORMAL LOW (ref 36.0–46.0)
HCT: 28.1 % — ABNORMAL LOW (ref 36.0–46.0)
Hemoglobin: 8.9 g/dL — ABNORMAL LOW (ref 12.0–15.0)
Hemoglobin: 9.5 g/dL — ABNORMAL LOW (ref 12.0–15.0)
Hemoglobin: 8.9 g/dL — ABNORMAL LOW (ref 12.0–15.0)

## 2023-09-22 LAB — FERRITIN: Ferritin: 52 ng/mL (ref 11–307)

## 2023-09-22 LAB — URINALYSIS, ROUTINE W REFLEX MICROSCOPIC
Bilirubin Urine: NEGATIVE
Glucose, UA: NEGATIVE mg/dL
Hgb urine dipstick: NEGATIVE
Ketones, ur: NEGATIVE mg/dL
Leukocytes,Ua: NEGATIVE
Nitrite: NEGATIVE
Protein, ur: NEGATIVE mg/dL
Specific Gravity, Urine: 1.021 (ref 1.005–1.030)
pH: 5 (ref 5.0–8.0)

## 2023-09-22 LAB — IRON AND TIBC
Iron: 51 ug/dL (ref 28–170)
Saturation Ratios: 15 % (ref 10.4–31.8)
TIBC: 346 ug/dL (ref 250–450)
UIBC: 295 ug/dL

## 2023-09-22 LAB — RETICULOCYTES
Immature Retic Fract: 16.5 % — ABNORMAL HIGH (ref 2.3–15.9)
RBC.: 4 MIL/uL (ref 3.87–5.11)
Retic Count, Absolute: 90.8 10*3/uL (ref 19.0–186.0)
Retic Ct Pct: 2.3 % (ref 0.4–3.1)

## 2023-09-22 LAB — TYPE AND SCREEN
ABO/RH(D): O NEG
Antibody Screen: NEGATIVE

## 2023-09-22 LAB — PROTIME-INR
INR: 1.1 (ref 0.8–1.2)
Prothrombin Time: 14.8 s (ref 11.4–15.2)

## 2023-09-22 LAB — LIPASE, BLOOD: Lipase: 42 U/L (ref 11–51)

## 2023-09-22 LAB — CK: Total CK: 41 U/L (ref 38–234)

## 2023-09-22 MED ORDER — LEVOTHYROXINE SODIUM 50 MCG PO TABS
75.0000 ug | ORAL_TABLET | Freq: Every day | ORAL | Status: DC
  Administered 2023-09-22 – 2023-09-23 (×2): 75 ug via ORAL
  Filled 2023-09-22 (×2): qty 1

## 2023-09-22 MED ORDER — BISOPROLOL FUMARATE 5 MG PO TABS
5.0000 mg | ORAL_TABLET | ORAL | Status: DC
  Filled 2023-09-22: qty 1

## 2023-09-22 MED ORDER — IOHEXOL 300 MG/ML  SOLN
100.0000 mL | Freq: Once | INTRAMUSCULAR | Status: AC | PRN
  Administered 2023-09-22: 100 mL via INTRAVENOUS

## 2023-09-22 MED ORDER — BENZONATATE 100 MG PO CAPS: 100.0000 mg | ORAL_CAPSULE | ORAL | Status: DC | PRN

## 2023-09-22 MED ORDER — ACETAMINOPHEN 650 MG RE SUPP: 650.0000 mg | Freq: Four times a day (QID) | RECTAL | Status: DC | PRN

## 2023-09-22 MED ORDER — LIDOCAINE 5 % EX PTCH
1.0000 | MEDICATED_PATCH | CUTANEOUS | Status: DC
  Administered 2023-09-22 – 2023-09-24 (×3): 1 via TRANSDERMAL
  Filled 2023-09-22 (×3): qty 1

## 2023-09-22 MED ORDER — DOCUSATE SODIUM 100 MG PO CAPS
100.0000 mg | ORAL_CAPSULE | Freq: Two times a day (BID) | ORAL | Status: DC | PRN
  Administered 2023-09-22 – 2023-09-23 (×2): 100 mg via ORAL
  Filled 2023-09-22 (×2): qty 1

## 2023-09-22 MED ORDER — BENZONATATE 100 MG PO CAPS: 100.0000 mg | ORAL_CAPSULE | Freq: Three times a day (TID) | ORAL | Status: DC | PRN

## 2023-09-22 MED ORDER — AMLODIPINE BESYLATE 5 MG PO TABS: 5.0000 mg | ORAL_TABLET | ORAL | Status: DC | PRN

## 2023-09-22 MED ORDER — POLYETHYLENE GLYCOL 3350 17 G PO PACK
17.0000 g | PACK | Freq: Every day | ORAL | Status: DC | PRN
  Administered 2023-09-22 – 2023-09-23 (×2): 17 g via ORAL
  Filled 2023-09-22 (×2): qty 1

## 2023-09-22 MED ORDER — ALBUTEROL SULFATE (2.5 MG/3ML) 0.083% IN NEBU: 2.5000 mg | INHALATION_SOLUTION | Freq: Four times a day (QID) | RESPIRATORY_TRACT | Status: DC | PRN

## 2023-09-22 MED ORDER — PANTOPRAZOLE SODIUM 40 MG PO TBEC
40.0000 mg | DELAYED_RELEASE_TABLET | Freq: Every day | ORAL | Status: DC
  Administered 2023-09-22 – 2023-09-24 (×3): 40 mg via ORAL
  Filled 2023-09-22 (×3): qty 1

## 2023-09-22 MED ORDER — ONDANSETRON HCL 4 MG/2ML IJ SOLN
4.0000 mg | Freq: Four times a day (QID) | INTRAMUSCULAR | Status: DC | PRN
  Administered 2023-09-22 – 2023-09-23 (×2): 4 mg via INTRAVENOUS
  Filled 2023-09-22 (×2): qty 2

## 2023-09-22 MED ORDER — ONDANSETRON HCL 4 MG/2ML IJ SOLN
4.0000 mg | Freq: Once | INTRAMUSCULAR | Status: AC
  Administered 2023-09-22: 4 mg via INTRAVENOUS
  Filled 2023-09-22: qty 2

## 2023-09-22 MED ORDER — FENTANYL CITRATE PF 50 MCG/ML IJ SOSY
25.0000 ug | PREFILLED_SYRINGE | Freq: Once | INTRAMUSCULAR | Status: AC
  Administered 2023-09-22: 25 ug via INTRAVENOUS
  Filled 2023-09-22: qty 1

## 2023-09-22 MED ORDER — HYDROMORPHONE HCL 1 MG/ML IJ SOLN
0.5000 mg | INTRAMUSCULAR | Status: DC | PRN
  Administered 2023-09-22 (×2): 0.5 mg via INTRAVENOUS
  Filled 2023-09-22 (×2): qty 0.5

## 2023-09-22 MED ORDER — ROSUVASTATIN CALCIUM 10 MG PO TABS
10.0000 mg | ORAL_TABLET | Freq: Every day | ORAL | Status: DC
  Administered 2023-09-22 – 2023-09-23 (×2): 10 mg via ORAL
  Filled 2023-09-22 (×2): qty 1

## 2023-09-22 MED ORDER — TEMAZEPAM 15 MG PO CAPS
15.0000 mg | ORAL_CAPSULE | Freq: Every day | ORAL | Status: DC
Start: 2023-09-22 — End: 2023-09-24
  Administered 2023-09-22 – 2023-09-23 (×2): 15 mg via ORAL
  Filled 2023-09-22 (×2): qty 1

## 2023-09-22 MED ORDER — LEVOTHYROXINE SODIUM 50 MCG PO TABS: 75.0000 ug | ORAL_TABLET | Freq: Every day | ORAL | Status: DC

## 2023-09-22 MED ORDER — SODIUM CHLORIDE 0.9 % IV BOLUS
500.0000 mL | Freq: Once | INTRAVENOUS | Status: AC
  Administered 2023-09-22: 500 mL via INTRAVENOUS

## 2023-09-22 MED ORDER — ONDANSETRON HCL 4 MG PO TABS: 4.0000 mg | ORAL_TABLET | Freq: Four times a day (QID) | ORAL | Status: DC | PRN

## 2023-09-22 MED ORDER — ACETAMINOPHEN 325 MG PO TABS
650.0000 mg | ORAL_TABLET | Freq: Four times a day (QID) | ORAL | Status: DC | PRN
  Administered 2023-09-22 – 2023-09-23 (×3): 650 mg via ORAL
  Filled 2023-09-22 (×5): qty 2

## 2023-09-22 NOTE — ED Triage Notes (Signed)
 Biba for sudden onset on LLQ pain that is intermittent cannot describe pain, "it just hurts".  Pt rates pain 10/10. Pt denies N/V/D and no urinary symptoms.

## 2023-09-22 NOTE — H&P (Addendum)
 History and Physical    ZERIAH Wise UJW:[id number] DOB: 11-06-1929 DOA: 09/22/2023  PCP: Enid Baas, MD (Confirm with patient/family/NH records and if not entered, this has to be entered at [facility] point of entry) Patient coming from: Home  I have personally briefly reviewed patient's old medical records in [facility]  Chief Complaint: Belly hurts  HPI: Kathryn Wise is a 89 y.o. female with medical history significant of HTN, hypothyroidism, HCOM, hypothyroidism, HLD, presented with worsening of left lower quadrant abdominal pain.  Patient developed cough 2 to 3 weeks ago and was diagnosed with flu and bronchitis for which patient was treated with 2 rounds of p.o. antibiotics including azithromycin and Augmentin plus steroid the last dose will be this morning for antibiotics and the steroid.  Since 2 weeks ago, patient started develop sharp like left lower quadrant abdominal pain worsening with cough and body movement for which she has been taking as needed OTC pain medications with little help.  She attributed pain to " twisted muscle from coughing" denies any fever or chills.  She has been constipated for last few days, and she reported that straining has made the pain worse.  Denied any bleeding problems before. ED Course: Afebrile, nontachycardic blood pressure elevated.  CT abdomen pelvis with contrast showed acute left lower rectus muscle hemorrhage with active intramuscular bleeding expansion of the muscle and early extension of the hematoma into the space of Retzius.  Blood work showed hemoglobin 10.6 compared to baseline 9-10, WBC 6.8, BUN 25 creatinine 0.9.  Review of Systems: As per HPI otherwise 14 point review of systems negative.    Past Medical History:  Diagnosis Date   Arthritis    Collagen vascular disease (HCC)    Dysphagia, pharyngoesophageal phase 09/10/2013   Upper GI study with barium swallow was done at  [facility] Mar 2015   No reflux seen  Small irreducible  hiatal hernia  Mild changes of presbyeophagus (abnormal contractions of the esophagus that occur with aging) No strictures Normal gastric emptying  Incomplete visualization of stomach fold due to patient's inability turn     GERD (gastroesophageal reflux disease)    Heart murmur    has had years and years   History of shingles Dec 2013   treated with steroids , post op from shoulder surgery   History of squamous cell carcinoma 05/02/2016   right mid lateral pretibial   Hypertension    Hypothyroidism    Major depressive disorder, single episode 11/05/2015   Osteoarthritis of left shoulder 07/14/2012   Pneumonia 10/21/2020   Squamous cell carcinoma of skin 05/12/2016   R mid lat pretibial - other skin cancers treated by Dr. Jarold Motto   Squamous cell carcinoma of skin 08/22/2020   left distal medial popliteal - [facility]    Past Surgical History:  Procedure Laterality Date   ARTHOSCOPIC ROTAOR CUFF REPAIR     LEFT  10+  YEARS     CAROTID PTA/STENT INTERVENTION Left 10/19/2020   Procedure: CAROTID PTA/STENT INTERVENTION;  Surgeon: Renford Dills, MD;  Location: [facility] INVASIVE CV LAB;  Service: Cardiovascular;  Laterality: Left;   CATARACT EXTRACTION W/ INTRAOCULAR LENS IMPLANT     RIGHT EYE   CATARACT EXTRACTION W/PHACO Left 03/28/2016   Procedure: CATARACT EXTRACTION PHACO AND INTRAOCULAR LENS PLACEMENT (IOC);  Surgeon: Lockie Mola, MD;  Location: [facility];  Service: Ophthalmology;  Laterality: Left;  TORIC   FACELIFT     FOOT SURGERY     rt  foot   TUMOR REMOVED   JOINT REPLACEMENT Left Dec 2013   shoulder   LEFT HEART CATH AND CORONARY ANGIOGRAPHY N/A 06/12/2017   Procedure: LEFT HEART CATH AND CORONARY ANGIOGRAPHY;  Surgeon: Antonieta Iba, MD;  Location: [facility] INVASIVE CV LAB;  Service: Cardiovascular;  Laterality: N/A;   RENAL ANGIOGRAPHY Left 01/07/2019   Procedure: RENAL ANGIOGRAPHY;  Surgeon: Renford Dills, MD;  Location: [facility] INVASIVE CV LAB;  Service:  Cardiovascular;  Laterality: Left;   RENAL ARTERY STENT     SKIN CANCER EXCISION     TOTAL SHOULDER ARTHROPLASTY  07/14/2012   Procedure: TOTAL SHOULDER ARTHROPLASTY;  Surgeon: Eulas Post, MD;  Location: [facility] OR;  Service: Orthopedics;  Laterality: Left;     reports that she quit smoking about 51 years ago. Her smoking use included cigarettes. She has never used smokeless tobacco. She reports current alcohol use. She reports that she does not use drugs.  Allergies  Allergen Reactions   Alendronate Swelling    And nausea     Codeine    Lisinopril     Spike in BP   Metoprolol Other (See Comments)    Lethargy - PT CURRENTLY TAKING    Oxycodone     Other reaction(s): Other (See Comments) Altered mental status   Oxycontin [Oxycodone Hcl]     Altered mental status    Family History  Problem Relation Age of Onset   Cancer Daughter        Breast   Breast cancer Daughter 88   Cancer Son     Prior to Admission medications   Medication Sig Start Date End Date Taking? Authorizing Provider  albuterol (VENTOLIN HFA) 108 (90 Base) MCG/ACT inhaler Inhale into the lungs. 09/12/23  Yes [provider]  azithromycin (ZITHROMAX) 250 MG tablet Take 250 mg by mouth as directed. 09/10/23  Yes [provider]  benzonatate (TESSALON) 200 MG capsule Take by mouth. 09/12/23  Yes [provider]  chlorpheniramine-HYDROcodone (TUSSIONEX) 10-8 MG/5ML Take by mouth. 09/12/23  Yes [provider]  methylPREDNISolone (MEDROL DOSEPAK) 4 MG TBPK tablet Take by mouth as directed. 05/29/23  Yes [provider]  predniSONE (DELTASONE) 20 MG tablet Take 2 tablets by mouth daily for 5 days followed by 1 tablet by mouth daily for 5 days and stop 09/12/23  Yes [provider]  amLODipine (NORVASC) 5 MG tablet Take 1 tablet (5 mg total) by mouth as needed (Once daily AS NEEDED for SBP (top blood pressure number) is greater than 160.). 11/30/22   Sondra Barges, PA-C   amoxicillin-clavulanate (AUGMENTIN) 875-125 MG tablet Take by mouth. 09/12/23 09/22/23  [provider]  aspirin EC 81 MG tablet Take 81 mg by mouth daily.    [provider]  bisoprolol (ZEBETA) 5 MG tablet Take 1 tablet (5 mg total) by mouth every other day. 08/05/23 11/03/23  Sondra Barges, PA-C  Cholecalciferol (VITAMIN D-3) 1000 units CAPS Take 1,000 Units by mouth daily.    [provider]  cyanocobalamin (VITAMIN B12) 1000 MCG tablet Take 1,000 mcg by mouth daily.    [provider]  hydrALAZINE (APRESOLINE) 25 MG tablet Take 25 mg by mouth as needed. Patient not taking: Reported on 05/06/2023    [provider]  levothyroxine (SYNTHROID) 75 MCG tablet Take 75 mcg by mouth daily. 04/10/23   [provider]  nitroGLYCERIN (NITROSTAT) 0.4 MG SL tablet Place 1 tablet (0.4 mg total) under the tongue every 5 (five)  minutes as needed for chest pain. 08/18/22   Willeen Niece, MD  omeprazole (PRILOSEC) 20 MG capsule TAKE 1 CAPSULE(20 MG) BY MOUTH EVERY MORNING AS NEEDED FOR HEARTBURN 06/13/22  Yes Sherlene Shams, MD  rosuvastatin (CRESTOR) 10 MG tablet Take 1 tablet (10 mg total) by mouth at bedtime. 03/12/22   Sherlene Shams, MD  temazepam (RESTORIL) 15 MG capsule TAKE 1 CAPSULE BY MOUTH AT BEDTIME AS NEEDED SLEEP 03/05/22  Yes Sherlene Shams, MD  Turmeric 500 MG CAPS Take 500 mg by mouth daily.    [provider]  XIIDRA 5 % SOLN Apply 1 drop to eye 2 (two) times daily. 06/17/23   [provider]    Physical Exam: Vitals:   09/22/23 0500 09/22/23 0530 09/22/23 0650 09/22/23 0745  BP: (!) 170/62 (!) 182/62 (!) 195/59 (!) 173/63  Pulse: 60   (!) 58  Resp:    20  Temp:      TempSrc:      SpO2: 96% 98% 98% 98%  Weight:      Height:        Constitutional: NAD, calm, comfortable Vitals:   09/22/23 0500 09/22/23 0530 09/22/23 0650 09/22/23 0745  BP: (!) 170/62 (!) 182/62 (!) 195/59 (!) 173/63  Pulse: 60   (!) 58  Resp:     20  Temp:      TempSrc:      SpO2: 96% 98% 98% 98%  Weight:      Height:       Eyes: PERRL, lids and conjunctivae normal ENMT: Mucous membranes are moist. Posterior pharynx clear of any exudate or lesions.Normal dentition.  Neck: normal, supple, no masses, no thyromegaly Respiratory: clear to auscultation bilaterally, no wheezing, no crackles. Normal respiratory effort. No accessory muscle use.  Cardiovascular: Regular rate and rhythm, systolic murmur on left side of the sternum. No extremity edema. 2+ pedal pulses. No carotid bruits.  Abdomen: no tenderness, no masses palpated. No hepatosplenomegaly. Bowel sounds positive.  Musculoskeletal: no clubbing / cyanosis. No joint deformity upper and lower extremities. Good ROM, no contractures. Normal muscle tone.  Tenderness on left lower quadrant of abdomen Skin: no rashes, lesions, ulcers. No induration Neurologic: CN 2-12 grossly intact. Sensation intact, DTR normal. Strength 5/5 in all 4.  Psychiatric: Normal judgment and insight. Alert and oriented x 3. Normal mood.     Labs on Admission: I have personally reviewed following labs and imaging studies  CBC: Recent Labs  Lab 09/22/23 0505  WBC 16.8*  NEUTROABS 8.8*  HGB 10.6*  HCT 33.3*  MCV 83.9  PLT 274   Basic Metabolic Panel: Recent Labs  Lab 09/22/23 0505  NA 132*  K 4.0  CL 101  CO2 24  GLUCOSE 115*  BUN 25*  CREATININE 0.93  CALCIUM 8.8*   GFR: Estimated Creatinine Clearance: 35.5 mL/min (by C-G formula based on SCr of 0.93 mg/dL). Liver Function Tests: Recent Labs  Lab 09/22/23 0505  AST 21  ALT 23  ALKPHOS 45  BILITOT 0.8  PROT 6.3*  ALBUMIN 3.1*   Recent Labs  Lab 09/22/23 0505  LIPASE 42   No results for input(s): "AMMONIA" in the last 168 hours. Coagulation Profile: Recent Labs  Lab 09/22/23 0726  INR 1.1   Cardiac Enzymes: No results for input(s): "CKTOTAL", "CKMB", "CKMBINDEX", "TROPONINI" in the last 168 hours. BNP (last 3  results) No results for input(s): "PROBNP" in the last 8760 hours. HbA1C: No results for input(s): "HGBA1C" in the last  72 hours. CBG: No results for input(s): "GLUCAP" in the last 168 hours. Lipid Profile: No results for input(s): "CHOL", "HDL", "LDLCALC", "TRIG", "CHOLHDL", "LDLDIRECT" in the last 72 hours. Thyroid Function Tests: No results for input(s): "TSH", "T4TOTAL", "FREET4", "T3FREE", "THYROIDAB" in the last 72 hours. Anemia Panel: No results for input(s): "VITAMINB12", "FOLATE", "FERRITIN", "TIBC", "IRON", "RETICCTPCT" in the last 72 hours. Urine analysis:    Component Value Date/Time   COLORURINE YELLOW (A) 09/22/2023 0539   APPEARANCEUR CLEAR (A) 09/22/2023 0539   LABSPEC 1.021 09/22/2023 0539   PHURINE 5.0 09/22/2023 0539   GLUCOSEU NEGATIVE 09/22/2023 0539   GLUCOSEU NEGATIVE 05/04/2020 1458   HGBUR NEGATIVE 09/22/2023 0539   BILIRUBINUR NEGATIVE 09/22/2023 0539   KETONESUR NEGATIVE 09/22/2023 0539   PROTEINUR NEGATIVE 09/22/2023 0539   UROBILINOGEN 0.2 05/04/2020 1458   NITRITE NEGATIVE 09/22/2023 0539   LEUKOCYTESUR NEGATIVE 09/22/2023 0539    Radiological Exams on Admission: CT ABDOMEN PELVIS W CONTRAST Result Date: 09/22/2023 CLINICAL DATA:  89 year old female with sudden onset severe left lower quadrant pain. EXAM: CT ABDOMEN AND PELVIS WITH CONTRAST TECHNIQUE: Multidetector CT imaging of the abdomen and pelvis was performed using the standard protocol following bolus administration of intravenous contrast. RADIATION DOSE REDUCTION: This exam was performed according to the departmental dose-optimization program which includes automated exposure control, adjustment of the mA and/or kV according to patient size and/or use of iterative reconstruction technique. CONTRAST:  OMNIPAQUE IOHEXOL 300 MG/ML  SOLN COMPARISON:  CT Abdomen and Pelvis 03/02/2023. FINDINGS: Lower chest: Stable mild cardiomegaly. Moderate size gastric hiatal hernia is stable. Trace layering  pleural effusions have regressed since last year, resolved on the left. Hyperinflated lung bases suspicious for underlying emphysema. There is new atelectasis in the medial segment of the right middle lobe. Hepatobiliary: Negative liver and gallbladder. Pancreas: Stable 1 cm pancreatic head cystic mass series 2 image 30 (no follow up imaging recommended in this age group). Spleen: Negative. Adrenals/Urinary Tract: Normal adrenal glands. Nonobstructed kidneys with normal renal enhancement and contrast excretion. Nondilated ureters. Early urine contrast excretion to the bladder. Chronic pelvic phleboliths. Hematoma in the anterior space of Retzius, but otherwise negative urinary bladder. See additional details below. Stomach/Bowel: Redundant large bowel with mild diverticulosis and mild retained stool. Normal appendix on series 2, image 63. No dilated small bowel. Decompressed intra-abdominal portion of the stomach. Decompressed duodenum. No free air or free fluid. Vascular/Lymphatic: Extensive Aortoiliac calcified atherosclerosis. Major arterial structures and portal venous system are patent. No lymphadenopathy. No retroperitoneal space hemorrhage. Reproductive: Stable and within normal limits. Other: No pelvic free fluid. Musculoskeletal: Chronic osteopenia. Stable vertebral height and alignment with minimal scoliosis. Sacrum and SI joints appear stable and intact. No acute osseous abnormality identified. Left lower abdominal wall large rectus intramuscular hemorrhage is new since August and there is a crescent shaped 2 cm area of contrast extravasation into the medial muscle on coronal image 29. The expanded lower rectus muscle encompasses about 63 x 76 x 122 mm (AP by transverse by CC) for hematoma volume of probably 100-200 mL. Surrounding inflammation in the ventral abdominal wall and thickened underlying ventral peritoneal lining. Hematoma is tracking into the space of Retzius at the symphysis. IMPRESSION: 1.  Acute Left Lower Rectus Muscle Hemorrhage with active intramuscular bleeding (positive contrast extravasation). Expansion of the muscle and early extension of hematoma into the space of Retzius. Hematoma volume estimated at 100-200 mL. 2. No retroperitoneal hemorrhage at this time. No other acute or inflammatory process identified in the abdomen  or pelvis. 3. Small pleural effusions regressed since August, trace on the right. 4. Multiple chronic findings as above. Electronically Signed   By: Odessa Fleming M.D.   On: 09/22/2023 06:42   DG Chest Port 1 View Result Date: 09/22/2023 CLINICAL DATA:  Left lower quadrant pain. EXAM: PORTABLE CHEST 1 VIEW COMPARISON:  Portable chest 03/01/2023 FINDINGS: Stable moderate cardiomegaly.  No vascular congestion or edema. Stable mediastinum. The aorta is tortuous and calcified. The lungs are clear. No new osseous findings.  Osteopenia.  Left shoulder arthroplasty. IMPRESSION: No evidence of acute chest disease. Stable cardiomegaly. Aortic atherosclerosis. Electronically Signed   By: Almira Bar M.D.   On: 09/22/2023 06:07    EKG: Independently reviewed.  Sinus, chronic LBBB  Assessment/Plan Principal Problem:   Symptomatic anemia Active Problems:   Hematoma of rectus sheath  (please populate well all problems here in Problem List. (For example, if patient is on BP meds at home and you resume or decide to hold them, it is a problem that needs to be her. Same for CAD, COPD, HLD and so on)  Acute rectus muscle hematoma -IR consulted who reviewed the CT contrast study, opinion is the source of bleeding comes from small caliber muscular branch artery, challenging to reach and perform embolization.  IR and general surgeon recommend conservative management, pain control, series of H&H and transfuse for hemodynamic instability. -H&H this afternoon and tonight -Hold off aspirin today as there are signs of active bleeding on CT -Lidocaine patch  Acute on chronic  normocytic anemia -Secondary to intramuscular bleeding, management as above-check iron study  Leukocytosis -Likely secondary to recent use of steroid.  Low suspicion for active infection.  Monitor off antibiotics.  HTN, uncontrolled -Resume home BP meds including amlodipine, bisoprolol  Hypothyroidism -Continue Synthroid   DVT prophylaxis: SCD Code Status: DNR Family Communication: Granddaughter at bedside Disposition Plan: Expect less than 2 midnight hospital stay Consults called: IR and general surgeon Admission status: MedSurg observation   Emeline General MD [facility] Pager [phone] 09/22/2023, 8:05 AM

## 2023-09-22 NOTE — ED Provider Notes (Signed)
 7:03 AM Assumed care for off going team.   Blood pressure (!) 195/59, pulse 60, temperature 97.6 F (36.4 C), temperature source Oral, resp. rate 18, height 5\' 5"  (1.651 m), weight 68 kg, SpO2 98%.  See their HPI for full report but in brief     1. Acute Left Lower Rectus Muscle Hemorrhage with active  intramuscular bleeding (positive contrast extravasation). Expansion  of the muscle and early extension of hematoma into the space of  Retzius. Hematoma volume estimated at 100-200 mL.  2. No retroperitoneal hemorrhage at this time. No other acute or  inflammatory process identified in the abdomen or pelvis.  3. Small pleural effusions regressed since August, trace on the  right.  4. Multiple chronic findings as above.    8:19 AM  D/w Dr Rica Records- difficult to embolize  recommend conservative rx and if worsening can let them know. Updated the hosptialist.      Concha Se, MD 09/22/23 [phone]

## 2023-09-22 NOTE — ED Notes (Signed)
 Advised nurse that patient has ready bed

## 2023-09-22 NOTE — ED Notes (Signed)
 Applied warm blanket/compress to abdomen.

## 2023-09-22 NOTE — ED Notes (Signed)
Called pharmacy to verify pain medication

## 2023-09-22 NOTE — Plan of Care (Signed)

## 2023-09-22 NOTE — Progress Notes (Signed)
 1230: Notified Dr. Chipper Herb of pt most recent hgb result = 8.9 (was 10.6 this AM at last check). Pt vitals currently stable. Pt denies complaints of SOB, tachycardia, dizziness or increased pain.   1330: Pt ate a few bites of baked potato. She reported feeling like a piece was stuck in her throat and she was unable to cough it up or swallow it completely. She said she has this problem at home. She denied any choking or coughing while eating or drinking. New order received for Clear liquid diet. Pt given zofran and dilaudid for nausea and pain. Pt reported relief within about 30 minutes.   1415: Pt c/o inability to urinate and bladder pain. Pt noted to be distended. Pt stated on arrival to unit this morning that she was straight cathed in the ED for same issue. No documentation noted in epic. [place] on admission was less that 50ml. Bladder scan now reading 199. Order for straight cath received. 650cc clear yellow urine drained from bladder. Pt tolerated well. Bladder scan now reading zero. Pt says she feels much better.

## 2023-09-22 NOTE — ED Provider Notes (Signed)
 [facility] Provider Note    Event Date/Time   First MD Initiated Contact with Patient 09/22/23 0501     (approximate)   History   Abdominal Pain (LLQ)   HPI  Kathryn Wise is a 89 y.o. female brought to the ED via EMS from home with a chief complaint of left lower quadrant abdominal pain times overnight.  Patient reports she was recently sick with cold/congestion type symptoms and treated with antibiotics.  Endorses feeling bloated.  Denies fever/chills, chest pain, shortness of breath, nausea, vomiting or diarrhea.  Last BM yesterday which patient states was hard. Takes baby ASA daily.     Past Medical History   Past Medical History:  Diagnosis Date   Arthritis    Collagen vascular disease (HCC)    Dysphagia, pharyngoesophageal phase 09/10/2013   Upper GI study with barium swallow was done at  [facility] Mar 2015   No reflux seen  Small irreducible hiatal hernia  Mild changes of presbyeophagus (abnormal contractions of the esophagus that occur with aging) No strictures Normal gastric emptying  Incomplete visualization of stomach fold due to patient's inability turn     GERD (gastroesophageal reflux disease)    Heart murmur    has had years and years   History of shingles Dec 2013   treated with steroids , post op from shoulder surgery   History of squamous cell carcinoma 05/02/2016   right mid lateral pretibial   Hypertension    Hypothyroidism    Major depressive disorder, single episode 11/05/2015   Osteoarthritis of left shoulder 07/14/2012   Pneumonia 10/21/2020   Squamous cell carcinoma of skin 05/12/2016   R mid lat pretibial - other skin cancers treated by Dr. Jarold Motto   Squamous cell carcinoma of skin 08/22/2020   left distal medial popliteal - [facility]     Active Problem List   Patient Active Problem List   Diagnosis Date Noted   Obstructive hypertrophic cardiomyopathy (HCC) 03/02/2023   UTI (urinary tract infection) 03/01/2023   NSTEMI  (non-ST elevated myocardial infarction) (HCC) 03/01/2023   Acute heart failure with preserved ejection fraction (HFpEF) (HCC) 03/01/2023   Leukocytosis 03/01/2023   Rigors 03/01/2023   Demand ischemia (HCC) 03/01/2023   Paroxysmal atrial flutter (HCC) 08/18/2022   Near syncope 08/18/2022   Paroxysmal tachycardia (HCC) 08/18/2022   HOCM (hypertrophic obstructive cardiomyopathy) (HCC) 08/18/2022   Labile hypertension 08/18/2022   Chest pain 08/18/2022   Paroxysmal atrial fibrillation with RVR (HCC) 08/17/2022   Subdural hematoma (HCC) 11/14/2021   Iron deficiency anemia, unspecified 10/12/2021   Cerumen impaction 10/10/2021   Prediabetes 10/10/2021   Polyarthralgia 09/26/2021   Decreased glomerular filtration rate (GFR) 04/01/2021   Aortic atherosclerosis (HCC) 03/28/2021   Hyponatremia 10/21/2020   Elevated troponin 10/21/2020   Carotid stenosis, symptomatic w/o infarct, bilateral 10/19/2020   Carotid stenosis 09/12/2020   Urinary incontinence, urge 05/05/2020   Arteriosclerotic cerebrovascular disease 05/05/2020   Recurrent occipital headache 03/07/2019   Sensorineural hearing loss (SNHL) of left ear 01/23/2019   Renal artery stenosis (HCC) 01/01/2019   Lymphedema 01/01/2019   PAD (peripheral artery disease) (HCC) 01/01/2019   History of TIA (transient ischemic attack) 11/13/2018   Tinnitus aurium, bilateral 10/19/2018   Blurred vision, bilateral 10/19/2018   Insomnia due to psychological stress 10/07/2018   Food intolerance in adult 10/07/2018   Myalgia due to statin 12/10/2017   Hyperlipidemia LDL goal <130 10/03/2017   Hiatal hernia 07/03/2017   CAD (coronary atherosclerotic disease)  07/03/2017   Hospital discharge follow-up 07/03/2017   Chest pain due to CAD (HCC) 06/10/2017   Pain in left wrist 09/20/2016   Breast cancer screening by mammogram 09/20/2016   Precordial pain 05/31/2016   Hypothyroidism 04/28/2015   Renovascular hypertension 01/13/2014   Osteopenia  10/25/2013   Disorder of bone and cartilage 10/25/2013   GERD (gastroesophageal reflux disease) 09/11/2013   Encounter for preventive health examination 09/11/2013   Heart murmur 11/28/2012   History of shingles    Peripheral edema 11/08/2012   Osteoarthritis of left shoulder 07/14/2012     Past Surgical History   Past Surgical History:  Procedure Laterality Date   ARTHOSCOPIC ROTAOR CUFF REPAIR     LEFT  10+  YEARS     CAROTID PTA/STENT INTERVENTION Left 10/19/2020   Procedure: CAROTID PTA/STENT INTERVENTION;  Surgeon: Renford Dills, MD;  Location: [facility] INVASIVE CV [facility];  Service: Cardiovascular;  Laterality: Left;   CATARACT EXTRACTION W/ INTRAOCULAR LENS IMPLANT     RIGHT EYE   CATARACT EXTRACTION W/PHACO Left 03/28/2016   Procedure: CATARACT EXTRACTION PHACO AND INTRAOCULAR LENS PLACEMENT (IOC);  Surgeon: Lockie Mola, MD;  Location: [facility];  Service: Ophthalmology;  Laterality: Left;  TORIC   FACELIFT     FOOT SURGERY     rt foot   TUMOR REMOVED   JOINT REPLACEMENT Left Dec 2013   shoulder   LEFT HEART CATH AND CORONARY ANGIOGRAPHY N/A 06/12/2017   Procedure: LEFT HEART CATH AND CORONARY ANGIOGRAPHY;  Surgeon: Antonieta Iba, MD;  Location: [facility] INVASIVE CV LAB;  Service: Cardiovascular;  Laterality: N/A;   RENAL ANGIOGRAPHY Left 01/07/2019   Procedure: RENAL ANGIOGRAPHY;  Surgeon: Renford Dills, MD;  Location: [facility] INVASIVE CV LAB;  Service: Cardiovascular;  Laterality: Left;   RENAL ARTERY STENT     SKIN CANCER EXCISION     TOTAL SHOULDER ARTHROPLASTY  07/14/2012   Procedure: TOTAL SHOULDER ARTHROPLASTY;  Surgeon: Eulas Post, MD;  Location: [facility] OR;  Service: Orthopedics;  Laterality: Left;     Home Medications   Prior to Admission medications   Medication Sig Start Date End Date Taking? Authorizing Provider  albuterol (VENTOLIN HFA) 108 (90 Base) MCG/ACT inhaler Inhale into the lungs. 09/12/23  Yes [provider]   azithromycin (ZITHROMAX) 250 MG tablet Take 250 mg by mouth as directed. 09/10/23  Yes [provider]  benzonatate (TESSALON) 200 MG capsule Take by mouth. 09/12/23  Yes [provider]  chlorpheniramine-HYDROcodone (TUSSIONEX) 10-8 MG/5ML Take by mouth. 09/12/23  Yes [provider]  methylPREDNISolone (MEDROL DOSEPAK) 4 MG TBPK tablet Take by mouth as directed. 05/29/23  Yes [provider]  predniSONE (DELTASONE) 20 MG tablet Take 2 tablets by mouth daily for 5 days followed by 1 tablet by mouth daily for 5 days and stop 09/12/23  Yes [provider]  amLODipine (NORVASC) 5 MG tablet Take 1 tablet (5 mg total) by mouth as needed (Once daily AS NEEDED for SBP (top blood pressure number) is greater than 160.). 11/30/22   Sondra Barges, PA-C  amoxicillin-clavulanate (AUGMENTIN) 875-125 MG tablet Take by mouth. 09/12/23 09/22/23  [provider]  aspirin EC 81 MG tablet Take 81 mg by mouth daily.    [provider]  bisoprolol (ZEBETA) 5 MG tablet Take 1 tablet (5 mg total) by mouth every other day. 08/05/23 11/03/23  Sondra Barges, PA-C  Cholecalciferol (VITAMIN D-3) 1000 units CAPS Take 1,000 Units by mouth daily.  [provider]  cyanocobalamin (VITAMIN B12) 1000 MCG tablet Take 1,000 mcg by mouth daily.    [provider]  hydrALAZINE (APRESOLINE) 25 MG tablet Take 25 mg by mouth as needed. Patient not taking: Reported on 05/06/2023    [provider]  levothyroxine (SYNTHROID) 75 MCG tablet Take 75 mcg by mouth daily. 04/10/23   [provider]  nitroGLYCERIN (NITROSTAT) 0.4 MG SL tablet Place 1 tablet (0.4 mg total) under the tongue every 5 (five) minutes as needed for chest pain. 08/18/22   Willeen Niece, MD  omeprazole (PRILOSEC) 20 MG capsule TAKE 1 CAPSULE(20 MG) BY MOUTH EVERY MORNING AS NEEDED FOR HEARTBURN Patient taking differently: Take 20 mg by mouth in the morning. 06/13/22   Sherlene Shams, MD   rosuvastatin (CRESTOR) 10 MG tablet Take 1 tablet (10 mg total) by mouth at bedtime. 03/12/22   Sherlene Shams, MD  temazepam (RESTORIL) 15 MG capsule TAKE 1 CAPSULE BY MOUTH AT BEDTIME AS NEEDED SLEEP Patient taking differently: Take 15 mg by mouth at bedtime. 03/05/22   Sherlene Shams, MD  Turmeric 500 MG CAPS Take 500 mg by mouth daily.    [provider]  XIIDRA 5 % SOLN Apply 1 drop to eye 2 (two) times daily. 06/17/23   [provider]     Allergies  Alendronate, Codeine, Lisinopril, Metoprolol, Oxycodone, and Oxycontin [oxycodone hcl]   Family History   Family History  Problem Relation Age of Onset   Cancer Daughter        Breast   Breast cancer Daughter 33   Cancer Son      Physical Exam  Triage Vital Signs: ED Triage Vitals  Encounter Vitals Group     BP 09/22/23 0458 (!) 123/93     Systolic BP Percentile --      Diastolic BP Percentile --      Pulse Rate 09/22/23 0458 (!) 58     Resp 09/22/23 0458 18     Temp 09/22/23 0458 97.6 F (36.4 C)     Temp Source 09/22/23 0458 Oral     SpO2 09/22/23 0458 98 %     Weight 09/22/23 0459 150 lb (68 kg)     Height 09/22/23 0459 5\' 5"  (1.651 m)     Head Circumference --      Peak Flow --      Pain Score 09/22/23 0458 10     Pain Loc --      Pain Education --      Exclude from Growth Chart --     Updated Vital Signs: BP (!) 195/59   Pulse 60   Temp 97.6 F (36.4 C) (Oral)   Resp 18   Ht 5\' 5"  (1.651 m)   Wt 68 kg   SpO2 98%   BMI 24.96 kg/m    General: Awake, mild distress. CV:  RRR.  Good peripheral perfusion.  Resp:  Normal effort.  CTAB. Abd:  Moderate tenderness to palpation left lower quadrant without rebound or guarding.  Mild distention.  Other:  No truncal vesicles.   ED Results / Procedures / Treatments  Labs (all labs ordered are listed, but only abnormal results are displayed) Labs Reviewed  CBC WITH DIFFERENTIAL/PLATELET - Abnormal; Notable for the following components:       Result Value   WBC 16.8 (*)    Hemoglobin 10.6 (*)    HCT 33.3 (*)    RDW 15.9 (*)    Neutro  Abs 8.8 (*)    Lymphs Abs 6.0 (*)    Monocytes Absolute 1.5 (*)    Abs Immature Granulocytes 0.13 (*)    All other components within normal limits  COMPREHENSIVE METABOLIC PANEL - Abnormal; Notable for the following components:   Sodium 132 (*)    Glucose, Bld 115 (*)    BUN 25 (*)    Calcium 8.8 (*)    Total Protein 6.3 (*)    Albumin 3.1 (*)    GFR, Estimated 58 (*)    All other components within normal limits  URINALYSIS, ROUTINE W REFLEX MICROSCOPIC - Abnormal; Notable for the following components:   Color, Urine YELLOW (*)    APPearance CLEAR (*)    All other components within normal limits  RESP PANEL BY RT-PCR (RSV, FLU A&B, COVID)  RVPGX2  LIPASE, BLOOD     EKG  ED ECG REPORT I, Choya Tornow J, the attending physician, personally viewed and interpreted this ECG.   Date: 09/22/2023  EKG Time: 0511  Rate: 53  Rhythm: sinus bradycardia  Axis: Normal  Intervals:left bundle branch block  ST&T Change: Nonspecific    RADIOLOGY  Pending  Official radiology report(s): CT ABDOMEN PELVIS W CONTRAST Result Date: 09/22/2023 CLINICAL DATA:  89 year old female with sudden onset severe left lower quadrant pain. EXAM: CT ABDOMEN AND PELVIS WITH CONTRAST TECHNIQUE: Multidetector CT imaging of the abdomen and pelvis was performed using the standard protocol following bolus administration of intravenous contrast. RADIATION DOSE REDUCTION: This exam was performed according to the departmental dose-optimization program which includes automated exposure control, adjustment of the mA and/or kV according to patient size and/or use of iterative reconstruction technique. CONTRAST:  OMNIPAQUE IOHEXOL 300 MG/ML  SOLN COMPARISON:  CT Abdomen and Pelvis 03/02/2023. FINDINGS: Lower chest: Stable mild cardiomegaly. Moderate size gastric hiatal hernia is stable. Trace layering pleural  effusions have regressed since last year, resolved on the left. Hyperinflated lung bases suspicious for underlying emphysema. There is new atelectasis in the medial segment of the right middle lobe. Hepatobiliary: Negative liver and gallbladder. Pancreas: Stable 1 cm pancreatic head cystic mass series 2 image 30 (no follow up imaging recommended in this age group). Spleen: Negative. Adrenals/Urinary Tract: Normal adrenal glands. Nonobstructed kidneys with normal renal enhancement and contrast excretion. Nondilated ureters. Early urine contrast excretion to the bladder. Chronic pelvic phleboliths. Hematoma in the anterior space of Retzius, but otherwise negative urinary bladder. See additional details below. Stomach/Bowel: Redundant large bowel with mild diverticulosis and mild retained stool. Normal appendix on series 2, image 63. No dilated small bowel. Decompressed intra-abdominal portion of the stomach. Decompressed duodenum. No free air or free fluid. Vascular/Lymphatic: Extensive Aortoiliac calcified atherosclerosis. Major arterial structures and portal venous system are patent. No lymphadenopathy. No retroperitoneal space hemorrhage. Reproductive: Stable and within normal limits. Other: No pelvic free fluid. Musculoskeletal: Chronic osteopenia. Stable vertebral height and alignment with minimal scoliosis. Sacrum and SI joints appear stable and intact. No acute osseous abnormality identified. Left lower abdominal wall large rectus intramuscular hemorrhage is new since August and there is a crescent shaped 2 cm area of contrast extravasation into the medial muscle on coronal image 29. The expanded lower rectus muscle encompasses about 63 x 76 x 122 mm (AP by transverse by CC) for hematoma volume of probably 100-200 mL. Surrounding inflammation in the ventral abdominal wall and thickened underlying ventral peritoneal lining. Hematoma is tracking into the space of Retzius at the symphysis. IMPRESSION: 1. Acute  Left Lower Rectus Muscle  Hemorrhage with active intramuscular bleeding (positive contrast extravasation). Expansion of the muscle and early extension of hematoma into the space of Retzius. Hematoma volume estimated at 100-200 mL. 2. No retroperitoneal hemorrhage at this time. No other acute or inflammatory process identified in the abdomen or pelvis. 3. Small pleural effusions regressed since August, trace on the right. 4. Multiple chronic findings as above. Electronically Signed   By: Odessa Fleming M.D.   On: 09/22/2023 06:42   DG Chest Port 1 View Result Date: 09/22/2023 CLINICAL DATA:  Left lower quadrant pain. EXAM: PORTABLE CHEST 1 VIEW COMPARISON:  Portable chest 03/01/2023 FINDINGS: Stable moderate cardiomegaly.  No vascular congestion or edema. Stable mediastinum. The aorta is tortuous and calcified. The lungs are clear. No new osseous findings.  Osteopenia.  Left shoulder arthroplasty. IMPRESSION: No evidence of acute chest disease. Stable cardiomegaly. Aortic atherosclerosis. Electronically Signed   By: Almira Bar M.D.   On: 09/22/2023 06:07     PROCEDURES:  Critical Care performed: Yes, see critical care procedure note(s)  CRITICAL CARE Performed by: Irean Hong   Total critical care time: 30 minutes  Critical care time was exclusive of separately billable procedures and treating other patients.  Critical care was necessary to treat or prevent imminent or life-threatening deterioration.  Critical care was time spent personally by me on the following activities: development of treatment plan with patient and/or surrogate as well as nursing, discussions with consultants, evaluation of patient's response to treatment, examination of patient, obtaining history from patient or surrogate, ordering and performing treatments and interventions, ordering and review of laboratory studies, ordering and review of radiographic studies, pulse oximetry and re-evaluation of patient's  condition.   [name]1-3 Lead EKG Interpretation  Performed by: Irean Hong, MD Authorized by: Irean Hong, MD     Interpretation: abnormal     ECG rate:  55   ECG rate assessment: bradycardic     Rhythm: sinus bradycardia     Ectopy: none     Conduction: normal   Comments:     Patient placed on cardiac monitor to evaluate for arrhythmias    MEDICATIONS ORDERED IN ED: Medications  sodium chloride 0.9 % bolus 500 mL (0 mLs Intravenous Stopped 09/22/23 0630)  ondansetron (ZOFRAN) injection 4 mg (4 mg Intravenous Given 09/22/23 0538)  fentaNYL (SUBLIMAZE) injection 25 mcg (25 mcg Intravenous Given 09/22/23 0538)  iohexol (OMNIPAQUE) 300 MG/ML solution 100 mL (100 mLs Intravenous Contrast Given 09/22/23 0618)     IMPRESSION / MDM / ASSESSMENT AND PLAN / ED COURSE  I reviewed the triage vital signs and the nursing notes.                             89 year old female presenting with left lower quadrant abdominal pain. Differential diagnosis includes, but is not limited to, ovarian cyst, ovarian torsion, acute appendicitis, diverticulitis, urinary tract infection/pyelonephritis, endometriosis, bowel obstruction, colitis, renal colic, gastroenteritis, hernia, fibroids, etc. I have personally reviewed patient's records and note a PCP office visit on 09/22/2023 for bronchitis.  Patient's presentation is most consistent with acute complicated illness / injury requiring diagnostic workup.  The patient is on the cardiac monitor to evaluate for evidence of arrhythmia and/or significant heart rate changes.  Will obtain labwork, urine, CT abdomen/pelvis. Administer IV fluids, low dose Fentanyl for pain, Zofran to prevent nausea.  Keep NPO and reassess.  Clinical Course as of 09/22/23 5409  Sun Sep 22, 2023  5784 Discussed CT scan results with on-call surgeon Dr. Everlene Farrier.  Feels area of rectus muscle hemorrhage is relatively small volume and would be extremely difficult for IR to ligate.  Recommends  compression, agrees with abdominal binder and admission to hospitalist services for serial H/H.  Patient and family member updated and agreeable with plan of care. [JS]    Clinical Course User Index [JS] Irean Hong, MD     FINAL CLINICAL IMPRESSION(S) / ED DIAGNOSES   Final diagnoses:  Left lower quadrant abdominal pain  Hematoma  Hemorrhage of muscle     Rx / DC Orders   ED Discharge Orders     None        Note:  This document was prepared using Dragon voice recognition software and may include unintentional dictation errors.   Irean Hong, MD 09/22/23 [phone]

## 2023-09-23 ENCOUNTER — Observation Stay: Payer: Medicare Other

## 2023-09-23 DIAGNOSIS — Z7982 Long term (current) use of aspirin: Secondary | ICD-10-CM | POA: Diagnosis not present

## 2023-09-23 DIAGNOSIS — R55 Syncope and collapse: Secondary | ICD-10-CM | POA: Diagnosis not present

## 2023-09-23 DIAGNOSIS — D72828 Other elevated white blood cell count: Secondary | ICD-10-CM | POA: Diagnosis present

## 2023-09-23 DIAGNOSIS — K219 Gastro-esophageal reflux disease without esophagitis: Secondary | ICD-10-CM | POA: Diagnosis present

## 2023-09-23 DIAGNOSIS — I10 Essential (primary) hypertension: Secondary | ICD-10-CM | POA: Diagnosis present

## 2023-09-23 DIAGNOSIS — E785 Hyperlipidemia, unspecified: Secondary | ICD-10-CM | POA: Diagnosis present

## 2023-09-23 DIAGNOSIS — K5900 Constipation, unspecified: Secondary | ICD-10-CM | POA: Diagnosis present

## 2023-09-23 DIAGNOSIS — Z8619 Personal history of other infectious and parasitic diseases: Secondary | ICD-10-CM | POA: Diagnosis not present

## 2023-09-23 DIAGNOSIS — T380X5A Adverse effect of glucocorticoids and synthetic analogues, initial encounter: Secondary | ICD-10-CM | POA: Diagnosis present

## 2023-09-23 DIAGNOSIS — S301XXA Contusion of abdominal wall, initial encounter: Secondary | ICD-10-CM | POA: Diagnosis not present

## 2023-09-23 DIAGNOSIS — Z79899 Other long term (current) drug therapy: Secondary | ICD-10-CM | POA: Diagnosis not present

## 2023-09-23 DIAGNOSIS — Z95828 Presence of other vascular implants and grafts: Secondary | ICD-10-CM | POA: Diagnosis not present

## 2023-09-23 DIAGNOSIS — I951 Orthostatic hypotension: Secondary | ICD-10-CM

## 2023-09-23 DIAGNOSIS — Z96612 Presence of left artificial shoulder joint: Secondary | ICD-10-CM | POA: Diagnosis present

## 2023-09-23 DIAGNOSIS — Z885 Allergy status to narcotic agent status: Secondary | ICD-10-CM | POA: Diagnosis not present

## 2023-09-23 DIAGNOSIS — Z8701 Personal history of pneumonia (recurrent): Secondary | ICD-10-CM | POA: Diagnosis not present

## 2023-09-23 DIAGNOSIS — E039 Hypothyroidism, unspecified: Secondary | ICD-10-CM | POA: Diagnosis present

## 2023-09-23 DIAGNOSIS — S0990XA Unspecified injury of head, initial encounter: Secondary | ICD-10-CM | POA: Diagnosis not present

## 2023-09-23 DIAGNOSIS — D649 Anemia, unspecified: Secondary | ICD-10-CM | POA: Diagnosis not present

## 2023-09-23 DIAGNOSIS — Z85828 Personal history of other malignant neoplasm of skin: Secondary | ICD-10-CM | POA: Diagnosis not present

## 2023-09-23 DIAGNOSIS — M7981 Nontraumatic hematoma of soft tissue: Secondary | ICD-10-CM | POA: Diagnosis present

## 2023-09-23 DIAGNOSIS — D62 Acute posthemorrhagic anemia: Secondary | ICD-10-CM | POA: Diagnosis present

## 2023-09-23 DIAGNOSIS — Z66 Do not resuscitate: Secondary | ICD-10-CM | POA: Diagnosis present

## 2023-09-23 DIAGNOSIS — I6782 Cerebral ischemia: Secondary | ICD-10-CM | POA: Diagnosis not present

## 2023-09-23 DIAGNOSIS — R339 Retention of urine, unspecified: Secondary | ICD-10-CM | POA: Diagnosis present

## 2023-09-23 DIAGNOSIS — I701 Atherosclerosis of renal artery: Secondary | ICD-10-CM | POA: Diagnosis present

## 2023-09-23 DIAGNOSIS — Z87891 Personal history of nicotine dependence: Secondary | ICD-10-CM | POA: Diagnosis not present

## 2023-09-23 DIAGNOSIS — M6289 Other specified disorders of muscle: Secondary | ICD-10-CM | POA: Diagnosis present

## 2023-09-23 LAB — CBC
HCT: 28.5 % — ABNORMAL LOW (ref 36.0–46.0)
Hemoglobin: 9.2 g/dL — ABNORMAL LOW (ref 12.0–15.0)
MCH: 26.8 pg (ref 26.0–34.0)
MCHC: 32.3 g/dL (ref 30.0–36.0)
MCV: 83.1 fL (ref 80.0–100.0)
Platelets: 236 10*3/uL (ref 150–400)
RBC: 3.43 MIL/uL — ABNORMAL LOW (ref 3.87–5.11)
RDW: 15.9 % — ABNORMAL HIGH (ref 11.5–15.5)
WBC: 18.1 10*3/uL — ABNORMAL HIGH (ref 4.0–10.5)
nRBC: 0 % (ref 0.0–0.2)

## 2023-09-23 LAB — HEMOGLOBIN: Hemoglobin: 9.9 g/dL — ABNORMAL LOW (ref 12.0–15.0)

## 2023-09-23 MED ORDER — TRAMADOL HCL 50 MG PO TABS
50.0000 mg | ORAL_TABLET | Freq: Four times a day (QID) | ORAL | Status: DC | PRN
  Administered 2023-09-24: 50 mg via ORAL
  Filled 2023-09-23: qty 1

## 2023-09-23 MED ORDER — CHLORHEXIDINE GLUCONATE CLOTH 2 % EX PADS
6.0000 | MEDICATED_PAD | Freq: Every day | CUTANEOUS | Status: DC
  Administered 2023-09-23: 6 via TOPICAL

## 2023-09-23 MED ORDER — SODIUM CHLORIDE 0.9 % IV BOLUS
1000.0000 mL | Freq: Once | INTRAVENOUS | Status: AC
  Administered 2023-09-23: 1000 mL via INTRAVENOUS

## 2023-09-23 MED ORDER — MIDODRINE HCL 5 MG PO TABS
5.0000 mg | ORAL_TABLET | Freq: Three times a day (TID) | ORAL | Status: DC
  Administered 2023-09-23 – 2023-09-24 (×3): 5 mg via ORAL
  Filled 2023-09-23 (×2): qty 1

## 2023-09-23 NOTE — Plan of Care (Signed)

## 2023-09-23 NOTE — Care Management Obs Status (Signed)
 MEDICARE OBSERVATION STATUS NOTIFICATION   Patient Details  Name: Kathryn Wise MRN: [id number] Date of Birth: [birth year]   Medicare Observation Status Notification Given:  Orland Dec, CMA 09/23/2023, 10:22 AM

## 2023-09-23 NOTE — Evaluation (Signed)
 Occupational Therapy Evaluation Patient Details Name: Kathryn Wise MRN: [id number] DOB: 1930/02/22 Today's Date: 09/23/2023   History of Present Illness   Kathryn Wise is a 89 y.o. female brought to the ED via EMS from home with a chief complaint of left lower quadrant abdominal pain times overnight. PMH significant for CAD, Carotid Artery Stenosis, HTN.   Clinical Impressions Kathryn Wise was seen for OT evaluation this date. Prior to hospital admission, pt was IND. Pt lives alone. Pt currently requires MOD A don B socks - pt limited by abdominal pain. CGA + RW sit<>stand, further mobility limited by positive orthostatics. Educated on abdominal precautions and log roll technique. Pt would benefit from skilled OT to address noted impairments and functional limitations (see below for any additional details). Upon hospital discharge, recommend no OT follow up, may benefit from initial supervision for IADL assist.   Supine: BP 100/55, MAP 65, HR 84 BP Seated: 82/56, MAP 68, HR 89     If plan is discharge home, recommend the following:   A little help with walking and/or transfers;A little help with bathing/dressing/bathroom;Help with stairs or ramp for entrance     Functional Status Assessment   Patient has had a recent decline in their functional status and demonstrates the ability to make significant improvements in function in a reasonable and predictable amount of time.     Equipment Recommendations   BSC/3in1     Recommendations for Other Services         Precautions/Restrictions   Precautions Precautions: Fall Precaution/Restrictions Comments: Monitor BP; Orthostatic. Fall while hospitalized on 2/24 Required Braces or Orthoses: Other Brace Other Brace: Abdominal Binder Restrictions Weight Bearing Restrictions Per Provider Order: No     Mobility Bed Mobility Overal bed mobility: Needs Assistance Bed Mobility: Rolling, Sidelying to Sit Rolling:  Supervision Sidelying to sit: Contact guard assist       General bed mobility comments: cues for log roll to help with pain in abdomen/back; use of rail.    Transfers Overall transfer level: Needs assistance Equipment used: None Transfers: Sit to/from Stand Sit to Stand: Supervision, Contact guard assist                  Balance Overall balance assessment: Needs assistance Sitting-balance support: Feet supported, No upper extremity supported       Standing balance support: During functional activity, No upper extremity supported Standing balance-Leahy Scale: Good                             ADL either performed or assessed with clinical judgement   ADL Overall ADL's : Needs assistance/impaired                                       General ADL Comments: MOD A don B socks - pt limited by abdominal pain. CGA + RW simulated BSC t/f, mobility limited by orthostatics      Pertinent Vitals/Pain Pain Assessment Pain Assessment: 0-10 Pain Score: 3  Pain Location: Back/Stomach Pain Descriptors / Indicators: Aching, Sore Pain Intervention(s): Limited activity within patient's tolerance     Extremity/Trunk Assessment Upper Extremity Assessment Upper Extremity Assessment: Overall WFL for tasks assessed   Lower Extremity Assessment Lower Extremity Assessment: Overall WFL for tasks assessed   Cervical / Trunk Assessment Cervical / Trunk Assessment: Normal   Communication Communication  Communication: No apparent difficulties   Cognition Arousal: Alert Behavior During Therapy: WFL for tasks assessed/performed Cognition: No apparent impairments                               Following commands: Intact                  Home Living Family/patient expects to be discharged to:: Private residence Living Arrangements: Alone Available Help at Discharge: Family Type of Home: House Home Access: Stairs to enter Water quality scientist of Steps: 1 Entrance Stairs-Rails: None Home Layout: One level     Bathroom Shower/Tub: Runner, broadcasting/film/video: Grab bars - tub/shower;Shower seat          Prior Functioning/Environment Prior Level of Function : Independent/Modified Independent;History of Falls (last six months);Driving             Mobility Comments: IND with ambulation, no AD. Pt/daughter report one fall in last 6 months ADLs Comments: IND with ADLs and IADls    OT Problem List: Decreased activity tolerance;Impaired balance (sitting and/or standing);Decreased range of motion   OT Treatment/Interventions: Self-care/ADL training;Therapeutic exercise;Energy conservation;DME and/or AE instruction;Therapeutic activities      OT Goals(Current goals can be found in the care plan section)   Acute Rehab OT Goals Patient Stated Goal: to go home OT Goal Formulation: With patient/family Time For Goal Achievement: 10/07/23 Potential to Achieve Goals: Good ADL Goals Pt Will Perform Grooming: Independently;standing Pt Will Perform Lower Body Dressing: Independently;sit to/from stand Pt Will Transfer to Toilet: Independently;ambulating;regular height toilet   OT Frequency:  Min 1X/week    Co-evaluation PT/OT/SLP Co-Evaluation/Treatment: Yes Reason for Co-Treatment: For patient/therapist safety;To address functional/ADL transfers PT goals addressed during session: Mobility/safety with mobility OT goals addressed during session: ADL's and self-care      AM-PAC OT "6 Clicks" Daily Activity     Outcome Measure Help from another person eating meals?: None Help from another person taking care of personal grooming?: None Help from another person toileting, which includes using toliet, bedpan, or urinal?: A Little Help from another person bathing (including washing, rinsing, drying)?: A Little Help from another person to put on and taking off regular upper body clothing?: None Help  from another person to put on and taking off regular lower body clothing?: A Lot 6 Click Score: 20   End of Session Equipment Utilized During Treatment: Rolling walker (2 wheels) Nurse Communication: Mobility status  Activity Tolerance: Patient tolerated treatment well Patient left: in bed;with call bell/phone within reach;with bed alarm set;with family/visitor present  OT Visit Diagnosis: Other abnormalities of gait and mobility (R26.89);Muscle weakness (generalized) (M62.81)                Time: 1610-9604 OT Time Calculation (min): 15 min Charges:  OT General Charges $OT Visit: 1 Visit OT Evaluation $OT Eval Moderate Complexity: 1 Mod  Kathie Dike, M.S. OTR/L  09/23/23, 4:11 PM  ascom [phone]

## 2023-09-23 NOTE — Progress Notes (Signed)
 Physical Therapy Evaluation Patient Details Name: Kathryn Wise MRN: [id number] DOB: 10-16-1929 Today's Date: 09/23/2023  History of Present Illness  Kathryn Wise is a 89 y.o. female brought to the ED via EMS from home with a chief complaint of left lower quadrant abdominal pain times overnight. PMH significant for CAD, Carotid Artery Stenosis, HTN.   Clinical Impression  Pt supine in bed upon arrival, agreeable to therapy session. Prior to admission, patient lives alone in single level home with 1 STE (through garage, no rails). Reports IND with mobility w/o AD and ADLs.   Prior to mobility, BP supine was 100/55. Patient able to complete bed mobility with supervision/CGA, verbal cues for log roll to facilitate improved abdominal/low back pain. BP assessed seated, with BP reading: 82/56, patient deny lightheadedness/symptoms. After rest break, agreeable to stand. Able to complete STS with close supervision/CGA for safety. Patient unable to tolerate standing long enough to obtain BP reading, patient symptomatic with lightheadedness/wooziness. Pt had to return to seated position, BP upon seated was 84/60. Further mobility deferred due to orthostatic BP readings. RN notified. Abdominal binder was donned throughout session. Patient will benefit from skilled acute PT services to address functional impairments (see below for additional) and maximize functional mobility. Anticipate the need for follow up PT services upon acute hospital discharge. Will continue to follow acutely.        If plan is discharge home, recommend the following: A little help with walking and/or transfers;Assist for transportation;Help with stairs or ramp for entrance   Can travel by private vehicle        Equipment Recommendations Rolling walker (2 wheels)  Recommendations for Other Services       Functional Status Assessment Patient has had a recent decline in their functional status and demonstrates the ability to  make significant improvements in function in a reasonable and predictable amount of time.     Precautions / Restrictions Precautions Precautions: Fall Precaution/Restrictions Comments: Monitor BP; Orthostatic. Fall while hospitalized on 2/24 Required Braces or Orthoses: Other Brace Other Brace: Abdominal Binder Restrictions Weight Bearing Restrictions Per Provider Order: No      Mobility  Bed Mobility Overal bed mobility: Needs Assistance Bed Mobility: Rolling, Sidelying to Sit Rolling: Supervision Sidelying to sit: Min assist       General bed mobility comments: cues for log roll to help with pain in abdomen/back; use of rail.    Transfers Overall transfer level: Needs assistance Equipment used: None Transfers: Sit to/from Stand Sit to Stand: Supervision, Contact guard assist           General transfer comment: BP supine: 100/55, BP Seated: 82/56. Patient denies lighteadedness seated. After rest break agreeable to stand. able to stand from EOB with close supv/CGA for safety. Attempted additional BP reading standing for orthostatic, unable to obtain reading prior to patient needing to sit due to symptomatic. Pt with wooziness/lighteadedness, and reports feeling hot with standing returned to seated position. Cool rag provided. BP reading upon seated: 84/60    Ambulation/Gait               General Gait Details: not attempted this date due to orthostatic BP  Stairs            Wheelchair Mobility     Tilt Bed    Modified Rankin (Stroke Patients Only)       Balance Overall balance assessment: Needs assistance Sitting-balance support: Feet supported, No upper extremity supported Sitting balance-Leahy Scale: Normal  Standing balance support: During functional activity, No upper extremity supported Standing balance-Leahy Scale: Good                               Pertinent Vitals/Pain Pain Assessment Pain Assessment: 0-10 Pain  Score: 3  Pain Location: Back/Stomach Pain Descriptors / Indicators: Aching, Sore Pain Intervention(s): Limited activity within patient's tolerance, Monitored during session    Home Living Family/patient expects to be discharged to:: Private residence Living Arrangements: Alone Available Help at Discharge: Family Type of Home: House Home Access: Stairs to enter Entrance Stairs-Rails: None Entrance Stairs-Number of Steps: 1   Home Layout: One level Home Equipment: Grab bars - tub/shower;Shower seat      Prior Function Prior Level of Function : Independent/Modified Independent;History of Falls (last six months);Driving             Mobility Comments: IND with ambulation, no AD. Pt/daughter report one fall in last 6 months ADLs Comments: IND with ADLs and IADls     Extremity/Trunk Assessment   Upper Extremity Assessment Upper Extremity Assessment: Defer to OT evaluation    Lower Extremity Assessment Lower Extremity Assessment: Overall WFL for tasks assessed    Cervical / Trunk Assessment Cervical / Trunk Assessment: Normal  Communication   Communication Communication: No apparent difficulties    Cognition Arousal: Alert Behavior During Therapy: WFL for tasks assessed/performed   PT - Cognitive impairments: No apparent impairments                         Following commands: Intact       Cueing       General Comments      Exercises     Assessment/Plan    PT Assessment Patient needs continued PT services  PT Problem List Decreased activity tolerance;Decreased balance;Decreased mobility;Pain       PT Treatment Interventions DME instruction;Gait training;Stair training;Functional mobility training;Therapeutic exercise;Therapeutic activities;Balance training;Neuromuscular re-education    PT Goals (Current goals can be found in the Care Plan section)  Acute Rehab PT Goals Patient Stated Goal: Feel Better; Get Home PT Goal Formulation: With  patient Time For Goal Achievement: 10/07/23 Potential to Achieve Goals: Good    Frequency Min 1X/week     Co-evaluation PT/OT/SLP Co-Evaluation/Treatment: Yes Reason for Co-Treatment: For patient/therapist safety;To address functional/ADL transfers PT goals addressed during session: Mobility/safety with mobility         AM-PAC PT "6 Clicks" Mobility  Outcome Measure Help needed turning from your back to your side while in a flat bed without using bedrails?: A Little Help needed moving from lying on your back to sitting on the side of a flat bed without using bedrails?: A Little Help needed moving to and from a bed to a chair (including a wheelchair)?: A Little Help needed standing up from a chair using your arms (e.g., wheelchair or bedside chair)?: A Little Help needed to walk in hospital room?: A Little Help needed climbing 3-5 steps with a railing? : A Little 6 Click Score: 18    End of Session   Activity Tolerance: Other (comment) (treatment limited secondary to low BP readings) Patient left: in bed;with bed alarm set;with call bell/phone within reach;with family/visitor present Nurse Communication: Mobility status;Other (comment) (BP readings) PT Visit Diagnosis: Unsteadiness on feet (R26.81);History of falling (Z91.81);Other abnormalities of gait and mobility (R26.89)    Time: 1610-9604 PT Time Calculation (min) (ACUTE ONLY): 16  min   Charges:   PT Evaluation $PT Eval Low Complexity: 1 Low   PT General Charges $$ ACUTE PT VISIT: 1 Visit         Howie Ill, PT, DPT 09/23/23 3:37 PM

## 2023-09-23 NOTE — Progress Notes (Signed)
Per Dr Posey Pronto, d.c tele monitoring

## 2023-09-23 NOTE — Progress Notes (Signed)
 Patient ambulating for first time since being on bedrest for 24 hours. Patient stood and walked a couple of steps in room with walker then turned around and fell backwards. Daughter and RN in room at time of incident. Patient landed on buttocks and back and head on floor. Rapid response and provider to room. No obvious injuries, all extremities within normal limits. Patient alert and oriented during incident, did not loose consciousness. See new orders.  09/23/23 1125  What Happened  Was fall witnessed? Yes  Who witnessed fall? Charline Bills, RN  Patients activity before fall ambulating-unassisted  Point of contact buttocks;head (Fell backwards, hit buttocks then head)  Was patient injured? Unsure (pending CT and no pain reported)  Provider Notification  Provider Name/Title Enedina Finner  Date Provider Notified 09/23/23  Time Provider Notified 1125  Method of Notification Face-to-face  Notification Reason Fall  Provider response At bedside  Date of Provider Response 09/23/23  Time of Provider Response 1130  Follow Up  Family notified Yes - comment  Time family notified 1148 (in the room)  Additional tests Yes-comment (CT of head and hemoglobin)  Simple treatment Other (comment) (zofran)  Progress note created (see row info) Yes  Blank note created Yes  Adult Fall Risk Assessment  Risk Factor Category (scoring not indicated) Fall has occurred during this admission (document High fall risk)  Patient Fall Risk Level High fall risk  Adult Fall Risk Interventions  Required Bundle Interventions *See Row Information* High fall risk - low, moderate, and high requirements implemented  Additional Interventions Family Supervision;Room near nurses station  Screening for Fall Injury Risk (To be completed on HIGH fall risk patients) - Assessing Need for Floor Mats  Risk For Fall Injury- Criteria for Floor Mats 85 years or older  Will Implement Floor Mats Yes (family in room)  Vitals  Temp 97.8 F  (36.6 C)  Temp Source Oral  BP 124/82  BP Location Left Arm  BP Method Automatic  Patient Position (if appropriate) Lying  Pulse Rate 86  Pulse Rate Source Monitor  Resp 18  Oxygen Therapy  SpO2 95 %  O2 Device Room Air  Pain Assessment  Pain Scale 0-10  Pain Score 0  PCA/Epidural/Spinal Assessment  Respiratory Pattern Regular;Unlabored  Neurological  Neuro (WDL) WDL  Level of Consciousness Alert  Orientation Level Oriented X4  Cognition Follows commands  Speech Clear  R Pupil Size (mm) 3  R Pupil Shape Round  R Pupil Reaction Brisk  L Pupil Size (mm) 3  L Pupil Shape Round  L Pupil Reaction Brisk  Motor Function/Sensation Assessment Motor response;Sensation;Motor strength  RUE Motor Response Purposeful movement  RUE Sensation Full sensation  RUE Motor Strength 5  LUE Motor Response Purposeful movement  LUE Sensation Full sensation  LUE Motor Strength 5  RLE Motor Response Purposeful movement  RLE Sensation Full sensation  RLE Motor Strength 5  LLE Motor Response Purposeful movement  LLE Sensation Full sensation  LLE Motor Strength 5  Neuro Symptoms None  Glasgow Coma Scale  Eye Opening 4  Best Verbal Response (NON-intubated) 5  Best Motor Response 6  Glasgow Coma Scale Score 15  Musculoskeletal  Musculoskeletal (WDL) X  Assistive Device Front wheel walker  Generalized Weakness Yes  Weight Bearing Restrictions Per Provider Order No  Musculoskeletal Details  RUE Full movement;Weakness  LUE Full movement;Weakness  RLE Full movement;Weakness  LLE Full movement;Weakness  Integumentary  Integumentary (WDL) WDL  Nurse Assisting with Skin Assessment on Admission Lequita Halt, RN

## 2023-09-23 NOTE — Progress Notes (Signed)
 [facility]  - [place] [facility]   PATIENT NAME: Kathryn Wise    MR#:  [id number]  DATE OF BIRTH:  [birth year]  SUBJECTIVE:  daughter in the room. Patient apparently came in after she had abdominal pain with significant coughing spell with recently outpatient be treated with bronchitis. Patient was found to have bleeding directors muscle sheath. She was restless and wanted to try get up after 24 hours being admitted. Nurse was in the room. Patient tried to get up and sustained syncopal episode and fell. Nontraumatic. She was orthostatic. Laying down blood pressure was 77/66. Patient received IV fluids. CT head negative. Overall feels better now.    VITALS:  Blood pressure (!) 100/55, pulse 76, temperature 98.5 F (36.9 C), resp. rate 17, height 5\' 5"  (1.651 m), weight 68 kg, SpO2 94%.  PHYSICAL EXAMINATION:   GENERAL:  89 y.o.-year-old patient with no acute distress.  LUNGS: Normal breath sounds bilaterally, no wheezing CARDIOVASCULAR: S1, S2 normal. No murmur   ABDOMEN: Soft, nontender, nondistended. Abdominal binder  EXTREMITIES: No  edema b/l.    NEUROLOGIC: nonfocal  patient is alert and awake   LABORATORY PANEL:  CBC Recent Labs  Lab 09/23/23 0537 09/23/23 1132  WBC 18.1*  --   HGB 9.2* 9.9*  HCT 28.5*  --   PLT 236  --     Chemistries  Recent Labs  Lab 09/22/23 0505  NA 132*  K 4.0  CL 101  CO2 24  GLUCOSE 115*  BUN 25*  CREATININE 0.93  CALCIUM 8.8*  AST 21  ALT 23  ALKPHOS 45  BILITOT 0.8   Cardiac Enzymes No results for input(s): "TROPONINI" in the last 168 hours. RADIOLOGY:  CT HEAD WO CONTRAST ( ) Result Date: 09/23/2023 CLINICAL DATA:  Provided history: Syncope/presyncope, cerebrovascular cause suspected. Additional history provided: Fall (with head trauma). EXAM: CT HEAD WITHOUT CONTRAST TECHNIQUE: Contiguous axial images were obtained from the base of the skull through the vertex without intravenous contrast. RADIATION  DOSE REDUCTION: This exam was performed according to the departmental dose-optimization program which includes automated exposure control, adjustment of the mA and/or kV according to patient size and/or use of iterative reconstruction technique. COMPARISON:  Head CT 02/18/2023. FINDINGS: Brain: Mild generalized cerebral atrophy. Patchy and ill-defined hypoattenuation within the cerebral white matter, nonspecific but compatible with mild chronic small vessel ischemic disease. Small chronic infarcts again demonstrated within the left cerebellar hemisphere. There is no acute intracranial hemorrhage. No demarcated cortical infarct. No extra-axial fluid collection. No evidence of an intracranial mass. No midline shift. Vascular: No hyperdense vessel.  Atherosclerotic calcifications. Skull: No calvarial fracture or aggressive osseous lesion. Sinuses/Orbits: No mass or acute finding within the imaged orbits. No significant paranasal sinus disease. IMPRESSION: 1. No evidence of an acute intracranial abnormality. 2. Mild cerebral white matter chronic small vessel ischemic disease. 3. Unchanged small chronic infarcts within left cerebellar hemisphere. 4. Mild cerebral atrophy. Electronically Signed   By: Jackey Loge D.O.   On: 09/23/2023 13:34   CT ABDOMEN PELVIS W CONTRAST Result Date: 09/22/2023 CLINICAL DATA:  89 year old female with sudden onset severe left lower quadrant pain. EXAM: CT ABDOMEN AND PELVIS WITH CONTRAST TECHNIQUE: Multidetector CT imaging of the abdomen and pelvis was performed using the standard protocol following bolus administration of intravenous contrast. RADIATION DOSE REDUCTION: This exam was performed according to the departmental dose-optimization program which includes automated exposure control, adjustment of the mA and/or kV according to patient size and/or use of iterative reconstruction  technique. CONTRAST:  OMNIPAQUE IOHEXOL 300 MG/ML  SOLN COMPARISON:  CT Abdomen and Pelvis  03/02/2023. FINDINGS: Lower chest: Stable mild cardiomegaly. Moderate size gastric hiatal hernia is stable. Trace layering pleural effusions have regressed since last year, resolved on the left. Hyperinflated lung bases suspicious for underlying emphysema. There is new atelectasis in the medial segment of the right middle lobe. Hepatobiliary: Negative liver and gallbladder. Pancreas: Stable 1 cm pancreatic head cystic mass series 2 image 30 (no follow up imaging recommended in this age group). Spleen: Negative. Adrenals/Urinary Tract: Normal adrenal glands. Nonobstructed kidneys with normal renal enhancement and contrast excretion. Nondilated ureters. Early urine contrast excretion to the bladder. Chronic pelvic phleboliths. Hematoma in the anterior space of Retzius, but otherwise negative urinary bladder. See additional details below. Stomach/Bowel: Redundant large bowel with mild diverticulosis and mild retained stool. Normal appendix on series 2, image 63. No dilated small bowel. Decompressed intra-abdominal portion of the stomach. Decompressed duodenum. No free air or free fluid. Vascular/Lymphatic: Extensive Aortoiliac calcified atherosclerosis. Major arterial structures and portal venous system are patent. No lymphadenopathy. No retroperitoneal space hemorrhage. Reproductive: Stable and within normal limits. Other: No pelvic free fluid. Musculoskeletal: Chronic osteopenia. Stable vertebral height and alignment with minimal scoliosis. Sacrum and SI joints appear stable and intact. No acute osseous abnormality identified. Left lower abdominal wall large rectus intramuscular hemorrhage is new since August and there is a crescent shaped 2 cm area of contrast extravasation into the medial muscle on coronal image 29. The expanded lower rectus muscle encompasses about 63 x 76 x 122 mm (AP by transverse by CC) for hematoma volume of probably 100-200 mL. Surrounding inflammation in the ventral abdominal wall and  thickened underlying ventral peritoneal lining. Hematoma is tracking into the space of Retzius at the symphysis. IMPRESSION: 1. Acute Left Lower Rectus Muscle Hemorrhage with active intramuscular bleeding (positive contrast extravasation). Expansion of the muscle and early extension of hematoma into the space of Retzius. Hematoma volume estimated at 100-200 mL. 2. No retroperitoneal hemorrhage at this time. No other acute or inflammatory process identified in the abdomen or pelvis. 3. Small pleural effusions regressed since August, trace on the right. 4. Multiple chronic findings as above. Electronically Signed   By: Odessa Fleming M.D.   On: 09/22/2023 06:42   DG Chest Port 1 View Result Date: 09/22/2023 CLINICAL DATA:  Left lower quadrant pain. EXAM: PORTABLE CHEST 1 VIEW COMPARISON:  Portable chest 03/01/2023 FINDINGS: Stable moderate cardiomegaly.  No vascular congestion or edema. Stable mediastinum. The aorta is tortuous and calcified. The lungs are clear. No new osseous findings.  Osteopenia.  Left shoulder arthroplasty. IMPRESSION: No evidence of acute chest disease. Stable cardiomegaly. Aortic atherosclerosis. Electronically Signed   By: Almira Bar M.D.   On: 09/22/2023 06:07    Assessment and Plan  Kathryn Wise is a 89 y.o. female with medical history significant of HTN, hypothyroidism, HCOM, hypothyroidism, HLD, presented with worsening of left lower quadrant abdominal pain.   Patient developed cough 2 to 3 weeks ago and was diagnosed with flu and bronchitis for which patient was treated with 2 rounds of p.o. antibiotics including azithromycin and Augmentin plus steroid the last dose will be this morning for antibiotics and the steroid.  Since 2 weeks ago, patient started develop sharp like left lower quadrant abdominal pain worsening with cough and body movement for which she has been taking as needed OTC pain medications with little help.    CT abdomen pelvis with contrast showed  acute left  lower rectus muscle hemorrhage with active intramuscular bleeding expansion of the muscle and early extension of the hematoma into the space of Retzius  patient came in with hemoglobin of 10.6   Acute rectus muscle hematoma suspected from intractable coughing due to bronchitis -IR consulted who reviewed the CT contrast study, opinion is the source of bleeding comes from small caliber muscular branch artery, challenging to reach and perform embolization.   --IR and general surgeon recommend conservative management, pain control, series of H&H and transfuse for hemodynamic instability. -- Came in with hemoglobin of 10.6-- 8.9-- 9.2-- 9.9 -- continue abdominal binder -Hold off aspirin today as there are signs of active bleeding on CT -Lidocaine patch -- PT OT to work with patient  Syncopal episode due to orthostatic hypotension -- patient had a fall nontraumatic in the room witnessed by RN and daughter -- she received IV fluid bolus of thousand cc. Feels a lot better. -- CT head negative for trauma -- PT OT to see patient   Acute on chronic normocytic anemia -Secondary to intramuscular bleeding, management as above-check iron study   Leukocytosis -Likely secondary to recent use of steroid.  Low suspicion for active infection.  Monitor off antibiotics.   HTN, uncontrolled/left renal artery stenosis status post stenting in June 2020 history of atrial tachycardia -- Takes bisoprolol and as needed amlodipine -will hold patient's bisoprolol due to orthostatic hypotension   Hypothyroidism -Continue Synthroid     DVT prophylaxis: SCD Code Status: DNR Family Communication: daughter at bedside Disposition Plan: Expect less than 2 midnight hospital stay  Level of care: Telemetry Medical Status is: Inpatient Remains inpatient appropriate because: hemoglobin for one more day. PT OT to see patient.  In eager to go home tomorrow.    TOTAL TIME TAKING CARE OF THIS PATIENT: 50 minutes.   >50% time spent on counselling and coordination of care  Note: This dictation was prepared with Dragon dictation along with smaller phrase technology. Any transcriptional errors that result from this process are unintentional.  Enedina Finner M.D    [facility]   CC: Primary care physician; Enid Baas, MD

## 2023-09-23 NOTE — Progress Notes (Signed)
   09/22/23 2200  Urine Characteristics  Intermittent/Straight Cath (mL) 450 mL   Jawo NP notified of patient reporting pain and inability to urinate.  On assessment patient appears distended.  Bladder scan 85ml.   Order given to straight cath once more and if in 6 hours patient is unable to void can place foley.  Catheterized patient for 450 clear yellow urine.  Patient states immediate relief.  Patient states has had to be catheterized x2 today for same reason. External purewick replaced.

## 2023-09-24 DIAGNOSIS — D649 Anemia, unspecified: Secondary | ICD-10-CM | POA: Diagnosis not present

## 2023-09-24 LAB — HEMOGLOBIN: Hemoglobin: 9.2 g/dL — ABNORMAL LOW (ref 12.0–15.0)

## 2023-09-24 MED ORDER — MIDODRINE HCL 5 MG PO TABS: 5.0000 mg | ORAL_TABLET | Freq: Three times a day (TID) | ORAL | 0 refills | Status: DC

## 2023-09-24 MED ORDER — FLEET ENEMA RE ENEM
1.0000 | ENEMA | Freq: Once | RECTAL | Status: AC
  Administered 2023-09-24: 1 via RECTAL

## 2023-09-24 MED ORDER — LIDOCAINE 5 % EX PTCH: 1.0000 | MEDICATED_PATCH | CUTANEOUS | 0 refills | Status: DC

## 2023-09-24 MED ORDER — BISACODYL 10 MG RE SUPP
10.0000 mg | Freq: Every day | RECTAL | Status: DC
  Administered 2023-09-24: 10 mg via RECTAL
  Filled 2023-09-24: qty 1

## 2023-09-24 MED ORDER — POLYETHYLENE GLYCOL 3350 17 G PO PACK: 17.0000 g | PACK | Freq: Every day | ORAL | 0 refills | Status: DC | PRN

## 2023-09-24 NOTE — Discharge Summary (Signed)
 Physician Discharge Summary   Patient: Kathryn Wise MRN: [id number] DOB: Mar 22, 1930  Admit date:     09/22/2023  Discharge date: 09/24/23  Discharge Physician: Enedina Finner   PCP: Enid Baas, MD   Recommendations at discharge:    Hold your Bisoprolol till your SBP (top number of BP reading) is >120 follow-up PCP in 1 to 2 week  Discharge Diagnoses: Principal Problem:   Symptomatic anemia Active Problems:   Hematoma of rectus sheath   Orthostatic hypotension   Kathryn Wise is a 89 y.o. female with medical history significant of HTN, hypothyroidism, HCOM, hypothyroidism, HLD, presented with worsening of left lower quadrant abdominal pain.   Patient developed cough 2 to 3 weeks ago and was diagnosed with flu and bronchitis for which patient was treated with 2 rounds of p.o. antibiotics including azithromycin and Augmentin plus steroid the last dose will be this morning for antibiotics and the steroid.  Since 2 weeks ago, patient started develop sharp like left lower quadrant abdominal pain worsening with cough and body movement for which she has been taking as needed OTC pain medications with little help.     CT abdomen pelvis with contrast showed acute left lower rectus muscle hemorrhage with active intramuscular bleeding expansion of the muscle and early extension of the hematoma into the space of Retzius  patient came in with hemoglobin of 10.6     Acute rectus muscle hematoma suspected from intractable coughing due to bronchitis -IR consulted who reviewed the CT contrast study, opinion is the source of bleeding comes from small caliber muscular branch artery, challenging to reach and perform embolization.   --IR and general surgeon recommend conservative management, pain control, series of H&H and transfuse for hemodynamic instability. -- Came in with hemoglobin of 10.6-- 8.9-- 9.2-- 9.9--9.2 --used abdominal binder -Hold off aspirin today as there are signs of active  bleeding on CT -Lidocaine patch -- PT OT to work with patient--HHPT   Syncopal episode due to orthostatic hypotension -- patient had a fall nontraumatic in the room witnessed by RN and daughter -- she received IV fluid bolus of thousand cc. Feels a lot better. -- CT head negative for trauma --BP better. Cont midodrine for 5 days for now, hold BP meds till sbp >120   Acute on chronic normocytic anemia -Secondary to intramuscular bleeding   Leukocytosis -Likely secondary to recent use of steroid.  Low suspicion for active infection.  Monitor off antibiotics.   HTN, uncontrolled/left renal artery stenosis status post stenting in June 2020 history of atrial tachycardia -- Takes bisoprolol and as needed amlodipine -will hold patient's bisoprolol due to orthostatic hypotension   Hypothyroidism -Continue Synthroid  Acute urinary retention constipation --foley removed yday--pt able to urinate after she had BM today  Pt is very eager to go  home.     DVT prophylaxis: SCD Code Status: DNR Family Communication: daughter at bedside      Consultants: gen surgery/IR b ER MD Disposition: Home health Diet recommendation:  Discharge Diet Orders (From admission, onward)     Start     Ordered   09/24/23 0000  Diet - low sodium heart healthy        09/24/23 1314           Cardiac diet DISCHARGE MEDICATION: Allergies as of 09/24/2023       Reactions   Alendronate Swelling   And nausea    Codeine    Lisinopril    Spike in BP  Metoprolol Other (See Comments)   Lethargy - PT CURRENTLY TAKING    Oxycodone    Other reaction(s): Other (See Comments) Altered mental status   Oxycontin [oxycodone Hcl]    Altered mental status        Medication List     PAUSE taking these medications    aspirin EC 81 MG tablet Wait to take this until your doctor or other care provider tells you to start again. Take 81 mg by mouth daily.   bisoprolol 5 MG tablet Wait to take this  until your doctor or other care provider tells you to start again. Commonly known as: ZEBETA Take 1 tablet (5 mg total) by mouth every other day.       STOP taking these medications    amoxicillin-clavulanate 875-125 MG tablet Commonly known as: AUGMENTIN   azithromycin 250 MG tablet Commonly known as: ZITHROMAX   hydrALAZINE 25 MG tablet Commonly known as: APRESOLINE   methylPREDNISolone 4 MG Tbpk tablet Commonly known as: MEDROL DOSEPAK   predniSONE 20 MG tablet Commonly known as: DELTASONE       TAKE these medications    albuterol 108 (90 Base) MCG/ACT inhaler Commonly known as: VENTOLIN HFA Inhale into the lungs.   amLODipine 5 MG tablet Commonly known as: NORVASC Take 1 tablet (5 mg total) by mouth as needed (Once daily AS NEEDED for SBP (top blood pressure number) is greater than 160.).   benzonatate 200 MG capsule Commonly known as: TESSALON Take by mouth.   chlorpheniramine-HYDROcodone 10-8 MG/5ML Commonly known as: TUSSIONEX Take by mouth.   cyanocobalamin 1000 MCG tablet Commonly known as: VITAMIN B12 Take 1,000 mcg by mouth daily.   levothyroxine 75 MCG tablet Commonly known as: SYNTHROID Take 75 mcg by mouth daily.   lidocaine 5 % Commonly known as: LIDODERM Place 1 patch onto the skin daily. Remove & Discard patch within 12 hours or as directed by MD Start taking on: September 25, 2023   midodrine 5 MG tablet Commonly known as: PROAMATINE Take 1 tablet (5 mg total) by mouth 3 (three) times daily with meals for 5 days.   nitroGLYCERIN 0.4 MG SL tablet Commonly known as: NITROSTAT Place 1 tablet (0.4 mg total) under the tongue every 5 (five) minutes as needed for chest pain.   omeprazole 20 MG capsule Commonly known as: PRILOSEC TAKE 1 CAPSULE(20 MG) BY MOUTH EVERY MORNING AS NEEDED FOR HEARTBURN   polyethylene glycol 17 g packet Commonly known as: MIRALAX / GLYCOLAX Take 17 g by mouth daily as needed for moderate constipation.    rosuvastatin 10 MG tablet Commonly known as: CRESTOR Take 1 tablet (10 mg total) by mouth at bedtime.   temazepam 15 MG capsule Commonly known as: RESTORIL TAKE 1 CAPSULE BY MOUTH AT BEDTIME AS NEEDED SLEEP   Turmeric 500 MG Caps Take 500 mg by mouth daily.   Vitamin D-3 25 MCG (1000 UT) Caps Take 1,000 Units by mouth daily.   Xiidra 5 % Soln Generic drug: Lifitegrast Apply 1 drop to eye 2 (two) times daily.        Follow-up Information     Enid Baas, MD Follow up.   Specialty: Internal Medicine Why: Hospital follow up Contact information: [address] [place] [address] [phone]                Discharge Exam: Ceasar Mons Weights   09/22/23 0459  Weight: 68 kg   GENERAL:  89 y.o.-year-old patient with no acute  distress.  LUNGS: Normal breath sounds bilaterally, no wheezing CARDIOVASCULAR: S1, S2 normal. No murmur   ABDOMEN: Soft, nontender, nondistended. Abdominal binder  EXTREMITIES: No  edema b/l.    NEUROLOGIC: nonfocal  patient is alert and awake  Condition at discharge: fair  The results of significant diagnostics from this hospitalization (including imaging, microbiology, ancillary and laboratory) are listed below for reference.   Imaging Studies: CT HEAD WO CONTRAST ( ) Result Date: 09/23/2023 CLINICAL DATA:  Provided history: Syncope/presyncope, cerebrovascular cause suspected. Additional history provided: Fall (with head trauma). EXAM: CT HEAD WITHOUT CONTRAST TECHNIQUE: Contiguous axial images were obtained from the base of the skull through the vertex without intravenous contrast. RADIATION DOSE REDUCTION: This exam was performed according to the departmental dose-optimization program which includes automated exposure control, adjustment of the mA and/or kV according to patient size and/or use of iterative reconstruction technique. COMPARISON:  Head CT 02/18/2023. FINDINGS: Brain: Mild generalized cerebral atrophy. Patchy and  ill-defined hypoattenuation within the cerebral white matter, nonspecific but compatible with mild chronic small vessel ischemic disease. Small chronic infarcts again demonstrated within the left cerebellar hemisphere. There is no acute intracranial hemorrhage. No demarcated cortical infarct. No extra-axial fluid collection. No evidence of an intracranial mass. No midline shift. Vascular: No hyperdense vessel.  Atherosclerotic calcifications. Skull: No calvarial fracture or aggressive osseous lesion. Sinuses/Orbits: No mass or acute finding within the imaged orbits. No significant paranasal sinus disease. IMPRESSION: 1. No evidence of an acute intracranial abnormality. 2. Mild cerebral white matter chronic small vessel ischemic disease. 3. Unchanged small chronic infarcts within left cerebellar hemisphere. 4. Mild cerebral atrophy. Electronically Signed   By: Jackey Loge D.O.   On: 09/23/2023 13:34   CT ABDOMEN PELVIS W CONTRAST Result Date: 09/22/2023 CLINICAL DATA:  89 year old female with sudden onset severe left lower quadrant pain. EXAM: CT ABDOMEN AND PELVIS WITH CONTRAST TECHNIQUE: Multidetector CT imaging of the abdomen and pelvis was performed using the standard protocol following bolus administration of intravenous contrast. RADIATION DOSE REDUCTION: This exam was performed according to the departmental dose-optimization program which includes automated exposure control, adjustment of the mA and/or kV according to patient size and/or use of iterative reconstruction technique. CONTRAST:  OMNIPAQUE IOHEXOL 300 MG/ML  SOLN COMPARISON:  CT Abdomen and Pelvis 03/02/2023. FINDINGS: Lower chest: Stable mild cardiomegaly. Moderate size gastric hiatal hernia is stable. Trace layering pleural effusions have regressed since last year, resolved on the left. Hyperinflated lung bases suspicious for underlying emphysema. There is new atelectasis in the medial segment of the right middle lobe. Hepatobiliary:  Negative liver and gallbladder. Pancreas: Stable 1 cm pancreatic head cystic mass series 2 image 30 (no follow up imaging recommended in this age group). Spleen: Negative. Adrenals/Urinary Tract: Normal adrenal glands. Nonobstructed kidneys with normal renal enhancement and contrast excretion. Nondilated ureters. Early urine contrast excretion to the bladder. Chronic pelvic phleboliths. Hematoma in the anterior space of Retzius, but otherwise negative urinary bladder. See additional details below. Stomach/Bowel: Redundant large bowel with mild diverticulosis and mild retained stool. Normal appendix on series 2, image 63. No dilated small bowel. Decompressed intra-abdominal portion of the stomach. Decompressed duodenum. No free air or free fluid. Vascular/Lymphatic: Extensive Aortoiliac calcified atherosclerosis. Major arterial structures and portal venous system are patent. No lymphadenopathy. No retroperitoneal space hemorrhage. Reproductive: Stable and within normal limits. Other: No pelvic free fluid. Musculoskeletal: Chronic osteopenia. Stable vertebral height and alignment with minimal scoliosis. Sacrum and SI joints appear stable and intact. No acute osseous abnormality identified. Left  lower abdominal wall large rectus intramuscular hemorrhage is new since August and there is a crescent shaped 2 cm area of contrast extravasation into the medial muscle on coronal image 29. The expanded lower rectus muscle encompasses about 63 x 76 x 122 mm (AP by transverse by CC) for hematoma volume of probably 100-200 mL. Surrounding inflammation in the ventral abdominal wall and thickened underlying ventral peritoneal lining. Hematoma is tracking into the space of Retzius at the symphysis. IMPRESSION: 1. Acute Left Lower Rectus Muscle Hemorrhage with active intramuscular bleeding (positive contrast extravasation). Expansion of the muscle and early extension of hematoma into the space of Retzius. Hematoma volume estimated at  100-200 mL. 2. No retroperitoneal hemorrhage at this time. No other acute or inflammatory process identified in the abdomen or pelvis. 3. Small pleural effusions regressed since August, trace on the right. 4. Multiple chronic findings as above. Electronically Signed   By: Odessa Fleming M.D.   On: 09/22/2023 06:42   DG Chest Port 1 View Result Date: 09/22/2023 CLINICAL DATA:  Left lower quadrant pain. EXAM: PORTABLE CHEST 1 VIEW COMPARISON:  Portable chest 03/01/2023 FINDINGS: Stable moderate cardiomegaly.  No vascular congestion or edema. Stable mediastinum. The aorta is tortuous and calcified. The lungs are clear. No new osseous findings.  Osteopenia.  Left shoulder arthroplasty. IMPRESSION: No evidence of acute chest disease. Stable cardiomegaly. Aortic atherosclerosis. Electronically Signed   By: Almira Bar M.D.   On: 09/22/2023 06:07    Microbiology: Results for orders placed or performed during the hospital encounter of 09/22/23  Resp panel by RT-PCR (RSV, Flu A&B, Covid) Urine, Clean Catch     Status: None   Collection Time: 09/22/23  5:39 AM   Specimen: Urine, Clean Catch; Nasal Swab  Result Value Ref Range Status   SARS Coronavirus 2 by RT PCR NEGATIVE NEGATIVE Final    Comment: (NOTE) SARS-CoV-2 target nucleic acids are NOT DETECTED.  The SARS-CoV-2 RNA is generally detectable in upper respiratory specimens during the acute phase of infection. The lowest concentration of SARS-CoV-2 viral copies this assay can detect is 138 copies/mL. A negative result does not preclude SARS-Cov-2 infection and should not be used as the sole basis for treatment or other patient management decisions. A negative result may occur with  improper specimen collection/handling, submission of specimen other than nasopharyngeal swab, presence of viral mutation(s) within the areas targeted by this assay, and inadequate number of viral copies(<138 copies/mL). A negative result must be combined with clinical  observations, patient history, and epidemiological information. The expected result is Negative.  Fact Sheet for Patients:  BloggerCourse.com  Fact Sheet for Healthcare Providers:  SeriousBroker.it  This test is no t yet approved or cleared by the [place] FDA and  has been authorized for detection and/or diagnosis of SARS-CoV-2 by FDA under an Emergency Use Authorization (EUA). This EUA will remain  in effect (meaning this test can be used) for the duration of the COVID-19 declaration under Section 564(b)(1) of the Act, 21 U.S.C.section 360bbb-3(b)(1), unless the authorization is terminated  or revoked sooner.       Influenza A by PCR NEGATIVE NEGATIVE Final   Influenza B by PCR NEGATIVE NEGATIVE Final    Comment: (NOTE) The Xpert Xpress SARS-CoV-2/FLU/RSV plus assay is intended as an aid in the diagnosis of influenza from Nasopharyngeal swab specimens and should not be used as a sole basis for treatment. Nasal washings and aspirates are unacceptable for Xpert Xpress SARS-CoV-2/FLU/RSV testing.  Fact Sheet for Patients:  BloggerCourse.com  Fact Sheet for Healthcare Providers: SeriousBroker.it  This test is not yet approved or cleared by the [place] FDA and has been authorized for detection and/or diagnosis of SARS-CoV-2 by FDA under an Emergency Use Authorization (EUA). This EUA will remain in effect (meaning this test can be used) for the duration of the COVID-19 declaration under Section 564(b)(1) of the Act, 21 U.S.C. section 360bbb-3(b)(1), unless the authorization is terminated or revoked.     Resp Syncytial Virus by PCR NEGATIVE NEGATIVE Final    Comment: (NOTE) Fact Sheet for Patients: BloggerCourse.com  Fact Sheet for Healthcare Providers: SeriousBroker.it  This test is not yet approved or cleared by  the [place] FDA and has been authorized for detection and/or diagnosis of SARS-CoV-2 by FDA under an Emergency Use Authorization (EUA). This EUA will remain in effect (meaning this test can be used) for the duration of the COVID-19 declaration under Section 564(b)(1) of the Act, 21 U.S.C. section 360bbb-3(b)(1), unless the authorization is terminated or revoked.  Performed at [facility], [address] [place], [place] [address]     Labs: CBC: Recent Labs  Lab 09/22/23 0505 09/22/23 1212 09/22/23 1551 09/22/23 1935 09/23/23 0537 09/23/23 1132 09/24/23 0610  WBC 16.8*  --   --   --  18.1*  --   --   NEUTROABS 8.8*  --   --   --   --   --   --   HGB 10.6* 8.9* 9.5* 8.9* 9.2* 9.9* 9.2*  HCT 33.3* 28.0* 29.7* 28.1* 28.5*  --   --   MCV 83.9  --   --   --  83.1  --   --   PLT 274  --   --   --  236  --   --    Basic Metabolic Panel: Recent Labs  Lab 09/22/23 0505  NA 132*  K 4.0  CL 101  CO2 24  GLUCOSE 115*  BUN 25*  CREATININE 0.93  CALCIUM 8.8*   Liver Function Tests: Recent Labs  Lab 09/22/23 0505  AST 21  ALT 23  ALKPHOS 45  BILITOT 0.8  PROT 6.3*  ALBUMIN 3.1*    Discharge time spent: greater than 30 minutes.  Signed: Enedina Finner, MD [facility] 09/24/2023

## 2023-09-24 NOTE — Discharge Instructions (Addendum)
 Hold your Bisoprolol till your SBP (top number of BP reading) is >120 Use Abdominal binder for 4-5 days while out of bed.

## 2023-09-24 NOTE — Plan of Care (Signed)
  Problem: Education: Goal: Knowledge of General Education information will improve Description: Including pain rating scale, medication(s)/side effects and non-pharmacologic comfort measures Outcome: Progressing   Problem: Pain Managment: Goal: General experience of comfort will improve and/or be controlled Outcome: Progressing

## 2023-09-25 ENCOUNTER — Inpatient Hospital Stay: Payer: Medicare Other

## 2023-09-25 ENCOUNTER — Other Ambulatory Visit: Payer: Self-pay

## 2023-09-25 ENCOUNTER — Inpatient Hospital Stay
Admission: EM | Admit: 2023-09-25 | Discharge: 2023-09-30 | DRG: 543 | Disposition: A | Discharge status: Skilled Nursing Facility | Payer: Medicare Other | Attending: Internal Medicine | Admitting: Internal Medicine

## 2023-09-25 ENCOUNTER — Emergency Department: Payer: Medicare Other

## 2023-09-25 DIAGNOSIS — D62 Acute posthemorrhagic anemia: Secondary | ICD-10-CM | POA: Diagnosis present

## 2023-09-25 DIAGNOSIS — R609 Edema, unspecified: Secondary | ICD-10-CM | POA: Diagnosis not present

## 2023-09-25 DIAGNOSIS — T83518A Infection and inflammatory reaction due to other urinary catheter, initial encounter: Secondary | ICD-10-CM | POA: Diagnosis present

## 2023-09-25 DIAGNOSIS — M6281 Muscle weakness (generalized): Secondary | ICD-10-CM | POA: Diagnosis not present

## 2023-09-25 DIAGNOSIS — M8468XA Pathological fracture in other disease, other site, initial encounter for fracture: Secondary | ICD-10-CM | POA: Diagnosis not present

## 2023-09-25 DIAGNOSIS — R112 Nausea with vomiting, unspecified: Secondary | ICD-10-CM | POA: Diagnosis not present

## 2023-09-25 DIAGNOSIS — K219 Gastro-esophageal reflux disease without esophagitis: Secondary | ICD-10-CM | POA: Diagnosis not present

## 2023-09-25 DIAGNOSIS — I2510 Atherosclerotic heart disease of native coronary artery without angina pectoris: Secondary | ICD-10-CM | POA: Diagnosis not present

## 2023-09-25 DIAGNOSIS — R2689 Other abnormalities of gait and mobility: Secondary | ICD-10-CM | POA: Diagnosis not present

## 2023-09-25 DIAGNOSIS — D649 Anemia, unspecified: Secondary | ICD-10-CM

## 2023-09-25 DIAGNOSIS — M5136 Other intervertebral disc degeneration, lumbar region with discogenic back pain only: Secondary | ICD-10-CM | POA: Diagnosis not present

## 2023-09-25 DIAGNOSIS — Y846 Urinary catheterization as the cause of abnormal reaction of the patient, or of later complication, without mention of misadventure at the time of the procedure: Secondary | ICD-10-CM

## 2023-09-25 DIAGNOSIS — Z1611 Resistance to penicillins: Secondary | ICD-10-CM | POA: Diagnosis not present

## 2023-09-25 DIAGNOSIS — S301XXA Contusion of abdominal wall, initial encounter: Secondary | ICD-10-CM

## 2023-09-25 DIAGNOSIS — I517 Cardiomegaly: Secondary | ICD-10-CM | POA: Diagnosis not present

## 2023-09-25 DIAGNOSIS — I110 Hypertensive heart disease with heart failure: Secondary | ICD-10-CM | POA: Diagnosis present

## 2023-09-25 DIAGNOSIS — I10 Essential (primary) hypertension: Secondary | ICD-10-CM | POA: Diagnosis not present

## 2023-09-25 DIAGNOSIS — I4719 Other supraventricular tachycardia: Secondary | ICD-10-CM | POA: Diagnosis present

## 2023-09-25 DIAGNOSIS — R0989 Other specified symptoms and signs involving the circulatory and respiratory systems: Secondary | ICD-10-CM | POA: Diagnosis present

## 2023-09-25 DIAGNOSIS — R29898 Other symptoms and signs involving the musculoskeletal system: Secondary | ICD-10-CM | POA: Diagnosis not present

## 2023-09-25 DIAGNOSIS — X503XXD Overexertion from repetitive movements, subsequent encounter: Secondary | ICD-10-CM

## 2023-09-25 DIAGNOSIS — I951 Orthostatic hypotension: Secondary | ICD-10-CM | POA: Diagnosis present

## 2023-09-25 DIAGNOSIS — S3992XA Unspecified injury of lower back, initial encounter: Secondary | ICD-10-CM | POA: Diagnosis not present

## 2023-09-25 DIAGNOSIS — Z87440 Personal history of urinary (tract) infections: Secondary | ICD-10-CM | POA: Diagnosis not present

## 2023-09-25 DIAGNOSIS — E871 Hypo-osmolality and hyponatremia: Secondary | ICD-10-CM | POA: Diagnosis present

## 2023-09-25 DIAGNOSIS — N390 Urinary tract infection, site not specified: Secondary | ICD-10-CM | POA: Diagnosis present

## 2023-09-25 DIAGNOSIS — R1310 Dysphagia, unspecified: Secondary | ICD-10-CM | POA: Diagnosis not present

## 2023-09-25 DIAGNOSIS — J90 Pleural effusion, not elsewhere classified: Secondary | ICD-10-CM | POA: Diagnosis not present

## 2023-09-25 DIAGNOSIS — E785 Hyperlipidemia, unspecified: Secondary | ICD-10-CM | POA: Diagnosis not present

## 2023-09-25 DIAGNOSIS — I701 Atherosclerosis of renal artery: Secondary | ICD-10-CM | POA: Diagnosis not present

## 2023-09-25 DIAGNOSIS — X500XXD Overexertion from strenuous movement or load, subsequent encounter: Secondary | ICD-10-CM | POA: Diagnosis not present

## 2023-09-25 DIAGNOSIS — Z7401 Bed confinement status: Secondary | ICD-10-CM | POA: Diagnosis not present

## 2023-09-25 DIAGNOSIS — D259 Leiomyoma of uterus, unspecified: Secondary | ICD-10-CM | POA: Diagnosis not present

## 2023-09-25 DIAGNOSIS — R58 Hemorrhage, not elsewhere classified: Secondary | ICD-10-CM | POA: Diagnosis not present

## 2023-09-25 DIAGNOSIS — Z885 Allergy status to narcotic agent status: Secondary | ICD-10-CM

## 2023-09-25 DIAGNOSIS — S22081A Stable burst fracture of T11-T12 vertebra, initial encounter for closed fracture: Secondary | ICD-10-CM | POA: Diagnosis not present

## 2023-09-25 DIAGNOSIS — Z79899 Other long term (current) drug therapy: Secondary | ICD-10-CM

## 2023-09-25 DIAGNOSIS — K5900 Constipation, unspecified: Secondary | ICD-10-CM | POA: Diagnosis not present

## 2023-09-25 DIAGNOSIS — R5383 Other fatigue: Secondary | ICD-10-CM | POA: Diagnosis not present

## 2023-09-25 DIAGNOSIS — Z888 Allergy status to other drugs, medicaments and biological substances status: Secondary | ICD-10-CM

## 2023-09-25 DIAGNOSIS — R531 Weakness: Secondary | ICD-10-CM | POA: Diagnosis not present

## 2023-09-25 DIAGNOSIS — E861 Hypovolemia: Secondary | ICD-10-CM | POA: Diagnosis not present

## 2023-09-25 DIAGNOSIS — Z96612 Presence of left artificial shoulder joint: Secondary | ICD-10-CM | POA: Diagnosis present

## 2023-09-25 DIAGNOSIS — K449 Diaphragmatic hernia without obstruction or gangrene: Secondary | ICD-10-CM | POA: Diagnosis not present

## 2023-09-25 DIAGNOSIS — R29890 Loss of height: Secondary | ICD-10-CM | POA: Diagnosis not present

## 2023-09-25 DIAGNOSIS — W19XXXA Unspecified fall, initial encounter: Secondary | ICD-10-CM | POA: Diagnosis not present

## 2023-09-25 DIAGNOSIS — S22081D Stable burst fracture of T11-T12 vertebra, subsequent encounter for fracture with routine healing: Secondary | ICD-10-CM | POA: Diagnosis not present

## 2023-09-25 DIAGNOSIS — Z803 Family history of malignant neoplasm of breast: Secondary | ICD-10-CM

## 2023-09-25 DIAGNOSIS — E039 Hypothyroidism, unspecified: Secondary | ICD-10-CM | POA: Diagnosis present

## 2023-09-25 DIAGNOSIS — M47817 Spondylosis without myelopathy or radiculopathy, lumbosacral region: Secondary | ICD-10-CM | POA: Diagnosis not present

## 2023-09-25 DIAGNOSIS — M359 Systemic involvement of connective tissue, unspecified: Secondary | ICD-10-CM | POA: Diagnosis present

## 2023-09-25 DIAGNOSIS — R42 Dizziness and giddiness: Secondary | ICD-10-CM | POA: Diagnosis not present

## 2023-09-25 DIAGNOSIS — Z95820 Peripheral vascular angioplasty status with implants and grafts: Secondary | ICD-10-CM

## 2023-09-25 DIAGNOSIS — Z66 Do not resuscitate: Secondary | ICD-10-CM | POA: Diagnosis present

## 2023-09-25 DIAGNOSIS — Z8619 Personal history of other infectious and parasitic diseases: Secondary | ICD-10-CM

## 2023-09-25 DIAGNOSIS — I959 Hypotension, unspecified: Secondary | ICD-10-CM | POA: Diagnosis not present

## 2023-09-25 DIAGNOSIS — B9620 Unspecified Escherichia coli [E. coli] as the cause of diseases classified elsewhere: Secondary | ICD-10-CM | POA: Diagnosis present

## 2023-09-25 DIAGNOSIS — S301XXD Contusion of abdominal wall, subsequent encounter: Secondary | ICD-10-CM | POA: Diagnosis not present

## 2023-09-25 DIAGNOSIS — Z85828 Personal history of other malignant neoplasm of skin: Secondary | ICD-10-CM

## 2023-09-25 DIAGNOSIS — M47816 Spondylosis without myelopathy or radiculopathy, lumbar region: Secondary | ICD-10-CM | POA: Diagnosis present

## 2023-09-25 DIAGNOSIS — M4854XA Collapsed vertebra, not elsewhere classified, thoracic region, initial encounter for fracture: Secondary | ICD-10-CM | POA: Diagnosis not present

## 2023-09-25 DIAGNOSIS — Z87891 Personal history of nicotine dependence: Secondary | ICD-10-CM

## 2023-09-25 DIAGNOSIS — Z79890 Hormone replacement therapy (postmenopausal): Secondary | ICD-10-CM

## 2023-09-25 DIAGNOSIS — I878 Other specified disorders of veins: Secondary | ICD-10-CM | POA: Diagnosis not present

## 2023-09-25 DIAGNOSIS — Z8679 Personal history of other diseases of the circulatory system: Secondary | ICD-10-CM

## 2023-09-25 DIAGNOSIS — Z7982 Long term (current) use of aspirin: Secondary | ICD-10-CM

## 2023-09-25 LAB — COMPREHENSIVE METABOLIC PANEL
ALT: 17 U/L (ref 0–44)
AST: 27 U/L (ref 15–41)
Albumin: 2.6 g/dL — ABNORMAL LOW (ref 3.5–5.0)
Alkaline Phosphatase: 42 U/L (ref 38–126)
Anion gap: 9 (ref 5–15)
BUN: 20 mg/dL (ref 8–23)
CO2: 23 mmol/L (ref 22–32)
Calcium: 8 mg/dL — ABNORMAL LOW (ref 8.9–10.3)
Chloride: 92 mmol/L — ABNORMAL LOW (ref 98–111)
Creatinine, Ser: 0.93 mg/dL (ref 0.44–1.00)
GFR, Estimated: 58 mL/min — ABNORMAL LOW (ref >60)
Glucose, Bld: 131 mg/dL — ABNORMAL HIGH (ref 70–99)
Potassium: 4.2 mmol/L (ref 3.5–5.1)
Sodium: 124 mmol/L — ABNORMAL LOW (ref 135–145)
Total Bilirubin: 1.8 mg/dL — ABNORMAL HIGH (ref 0.0–1.2)
Total Protein: 5.5 g/dL — ABNORMAL LOW (ref 6.5–8.1)

## 2023-09-25 LAB — URINALYSIS, COMPLETE (UACMP) WITH MICROSCOPIC
Bilirubin Urine: NEGATIVE
Glucose, UA: NEGATIVE mg/dL
Ketones, ur: NEGATIVE mg/dL
Nitrite: POSITIVE — AB
Protein, ur: NEGATIVE mg/dL
Specific Gravity, Urine: 1.01 (ref 1.005–1.030)
pH: 5 (ref 5.0–8.0)

## 2023-09-25 LAB — CBC
HCT: 25.8 % — ABNORMAL LOW (ref 36.0–46.0)
Hemoglobin: 8.4 g/dL — ABNORMAL LOW (ref 12.0–15.0)
MCH: 27.3 pg (ref 26.0–34.0)
MCHC: 32.6 g/dL (ref 30.0–36.0)
MCV: 83.8 fL (ref 80.0–100.0)
Platelets: 169 10*3/uL (ref 150–400)
RBC: 3.08 MIL/uL — ABNORMAL LOW (ref 3.87–5.11)
RDW: 16 % — ABNORMAL HIGH (ref 11.5–15.5)
WBC: 19.3 10*3/uL — ABNORMAL HIGH (ref 4.0–10.5)
nRBC: 0 % (ref 0.0–0.2)

## 2023-09-25 MED ORDER — LEVOTHYROXINE SODIUM 50 MCG PO TABS: 75.0000 ug | ORAL_TABLET | Freq: Every day | ORAL | Status: DC

## 2023-09-25 MED ORDER — SODIUM CHLORIDE 0.9% FLUSH
3.0000 mL | Freq: Two times a day (BID) | INTRAVENOUS | Status: DC
  Administered 2023-09-25 – 2023-09-30 (×10): 3 mL via INTRAVENOUS

## 2023-09-25 MED ORDER — TEMAZEPAM 7.5 MG PO CAPS
15.0000 mg | ORAL_CAPSULE | Freq: Every evening | ORAL | Status: DC | PRN
  Administered 2023-09-25 – 2023-09-29 (×5): 15 mg via ORAL
  Filled 2023-09-25 (×5): qty 2

## 2023-09-25 MED ORDER — ACETAMINOPHEN 650 MG RE SUPP: 650.0000 mg | Freq: Four times a day (QID) | RECTAL | Status: DC | PRN

## 2023-09-25 MED ORDER — LACTATED RINGERS IV SOLN: INTRAVENOUS | Status: DC

## 2023-09-25 MED ORDER — ACETAMINOPHEN 325 MG PO TABS: 650.0000 mg | ORAL_TABLET | Freq: Four times a day (QID) | ORAL | Status: DC | PRN

## 2023-09-25 MED ORDER — ORAL CARE MOUTH RINSE: 15.0000 mL | OROMUCOSAL | Status: DC | PRN

## 2023-09-25 MED ORDER — PANTOPRAZOLE SODIUM 40 MG PO TBEC
40.0000 mg | DELAYED_RELEASE_TABLET | Freq: Every day | ORAL | Status: DC
  Administered 2023-09-25 – 2023-09-30 (×6): 40 mg via ORAL
  Filled 2023-09-25 (×6): qty 1

## 2023-09-25 MED ORDER — ROSUVASTATIN CALCIUM 10 MG PO TABS
10.0000 mg | ORAL_TABLET | Freq: Every day | ORAL | Status: DC
  Administered 2023-09-25 – 2023-09-29 (×5): 10 mg via ORAL
  Filled 2023-09-25 (×5): qty 1

## 2023-09-25 MED ORDER — HYDROCODONE-ACETAMINOPHEN 5-325 MG PO TABS
1.0000 | ORAL_TABLET | ORAL | Status: DC | PRN
  Administered 2023-09-25: 1 via ORAL
  Administered 2023-09-26 (×2): 2 via ORAL
  Administered 2023-09-27: 1 via ORAL
  Administered 2023-09-27 – 2023-09-28 (×3): 2 via ORAL
  Administered 2023-09-28: 1 via ORAL
  Administered 2023-09-28: 2 via ORAL
  Administered 2023-09-28: 1 via ORAL
  Administered 2023-09-29 (×3): 2 via ORAL
  Administered 2023-09-29: 1 via ORAL
  Administered 2023-09-30: 2 via ORAL
  Filled 2023-09-25 (×2): qty 2
  Filled 2023-09-25 (×3): qty 1
  Filled 2023-09-25: qty 2
  Filled 2023-09-25: qty 1
  Filled 2023-09-25 (×5): qty 2
  Filled 2023-09-25 (×3): qty 1
  Filled 2023-09-25: qty 2
  Filled 2023-09-25: qty 1

## 2023-09-25 MED ORDER — DOCUSATE SODIUM 100 MG PO CAPS
100.0000 mg | ORAL_CAPSULE | Freq: Two times a day (BID) | ORAL | Status: DC
  Administered 2023-09-25 – 2023-09-29 (×9): 100 mg via ORAL
  Filled 2023-09-25 (×10): qty 1

## 2023-09-25 MED ORDER — BISACODYL 5 MG PO TBEC
5.0000 mg | DELAYED_RELEASE_TABLET | Freq: Every day | ORAL | Status: DC | PRN
  Administered 2023-09-27 – 2023-09-29 (×3): 5 mg via ORAL
  Filled 2023-09-25 (×3): qty 1

## 2023-09-25 MED ORDER — MORPHINE SULFATE (PF) 2 MG/ML IV SOLN
2.0000 mg | INTRAVENOUS | Status: DC | PRN
  Administered 2023-09-25 – 2023-09-28 (×2): 2 mg via INTRAVENOUS
  Filled 2023-09-25 (×2): qty 1

## 2023-09-25 MED ORDER — POLYETHYLENE GLYCOL 3350 17 G PO PACK
17.0000 g | PACK | Freq: Every day | ORAL | Status: DC
  Administered 2023-09-25 – 2023-09-29 (×5): 17 g via ORAL
  Filled 2023-09-25 (×6): qty 1

## 2023-09-25 MED ORDER — ONDANSETRON HCL 4 MG PO TABS: 4.0000 mg | ORAL_TABLET | Freq: Four times a day (QID) | ORAL | Status: DC | PRN

## 2023-09-25 MED ORDER — SODIUM CHLORIDE 0.9 % IV SOLN
1.0000 g | INTRAVENOUS | Status: DC
  Administered 2023-09-26: 1 g via INTRAVENOUS
  Filled 2023-09-25 (×2): qty 10

## 2023-09-25 MED ORDER — LIDOCAINE 5 % EX PTCH
1.0000 | MEDICATED_PATCH | CUTANEOUS | Status: DC
  Administered 2023-09-25 – 2023-09-30 (×5): 1 via TRANSDERMAL
  Filled 2023-09-25 (×6): qty 1

## 2023-09-25 MED ORDER — MIDODRINE HCL 5 MG PO TABS
5.0000 mg | ORAL_TABLET | Freq: Three times a day (TID) | ORAL | Status: DC
  Administered 2023-09-26 – 2023-09-28 (×8): 5 mg via ORAL
  Filled 2023-09-25 (×8): qty 1

## 2023-09-25 MED ORDER — IOHEXOL 300 MG/ML  SOLN
100.0000 mL | Freq: Once | INTRAMUSCULAR | Status: AC | PRN
  Administered 2023-09-25: 100 mL via INTRAVENOUS

## 2023-09-25 MED ORDER — LEVOTHYROXINE SODIUM 50 MCG PO TABS
75.0000 ug | ORAL_TABLET | Freq: Every day | ORAL | Status: DC
  Administered 2023-09-25 – 2023-09-29 (×5): 75 ug via ORAL
  Filled 2023-09-25 (×6): qty 1

## 2023-09-25 MED ORDER — ONDANSETRON HCL 4 MG/2ML IJ SOLN
4.0000 mg | Freq: Four times a day (QID) | INTRAMUSCULAR | Status: DC | PRN
  Administered 2023-09-27: 4 mg via INTRAVENOUS
  Filled 2023-09-25: qty 2

## 2023-09-25 MED ORDER — HYDRALAZINE HCL 20 MG/ML IJ SOLN: 5.0000 mg | INTRAMUSCULAR | Status: DC | PRN

## 2023-09-25 MED ORDER — SODIUM CHLORIDE 0.9 % IV SOLN
1.0000 g | Freq: Once | INTRAVENOUS | Status: AC
  Administered 2023-09-25: 1 g via INTRAVENOUS
  Filled 2023-09-25: qty 10

## 2023-09-25 MED ORDER — SODIUM CHLORIDE 0.9 % IV BOLUS
1000.0000 mL | Freq: Once | INTRAVENOUS | Status: AC
  Administered 2023-09-25: 1000 mL via INTRAVENOUS

## 2023-09-25 NOTE — Plan of Care (Signed)

## 2023-09-25 NOTE — ED Notes (Signed)
 Patient to MRI at this time.

## 2023-09-25 NOTE — ED Provider Notes (Signed)
 [facility] Provider Note    None    (approximate)  History   Chief Complaint: Fatigue  HPI  BRIEL GALLICCHIO is a 89 y.o. female with a past medical history of gastric reflux, hypertension, presents to the emergency department for weakness, near syncope, hypotension.  Per patient and record review she was recently discharged from the hospital yesterday.  Patient was admitted for a abdominal wall hematoma with decreasing hemoglobins given a abdominal binder to wear.  Also found to be hypotensive with orthostatic symptoms was taken off of her blood pressure medications and placed on midodrine for 5 days.  Family member states this morning patient was very lightheaded/weak, checked her blood pressure and it was in the 80s despite using midodrine they were concerned so they called EMS to bring the patient to the hospital.  They also state since going home patient is felt very weak and fatigued and continues to have pain both in the anterior abdomen as well as the left back.  Physical Exam   Triage Vital Signs: ED Triage Vitals  Encounter Vitals Group     BP 09/25/23 1232 113/60     Systolic BP Percentile --      Diastolic BP Percentile --      Pulse Rate 09/25/23 1232 87     Resp 09/25/23 1232 18     Temp 09/25/23 1232 98 F (36.7 C)     Temp Source 09/25/23 1232 Oral     SpO2 09/25/23 1232 92 %     Weight 09/25/23 1236 150 lb (68 kg)     Height --      Head Circumference --      Peak Flow --      Pain Score 09/25/23 1236 0     Pain Loc --      Pain Education --      Exclude from Growth Chart --     Most recent vital signs: Vitals:   09/25/23 1232  BP: 113/60  Pulse: 87  Resp: 18  Temp: 98 F (36.7 C)  SpO2: 92%    General: Awake, no distress.  CV:  Good peripheral perfusion.  Regular rate and rhythm  Resp:  Normal effort.  Equal breath sounds bilaterally.  Abd:  No distention.  Soft, mild tenderness in the mid abdomen.  No rebound or  guarding.  ED Results / Procedures / Treatments   RADIOLOGY  CT pending   MEDICATIONS ORDERED IN ED: Medications  sodium chloride 0.9 % bolus 1,000 mL (has no administration in time range)     IMPRESSION / MDM / ASSESSMENT AND PLAN / ED COURSE  I reviewed the triage vital signs and the nursing notes.  Patient's presentation is most consistent with acute presentation with potential threat to life or bodily function.  Patient presents to the emergency department with concerns of generalized fatigue weakness low blood pressure despite midodrine and continued pain.  Patient does have some mild abdominal tenderness likely due to her abdominal hematoma.  Patient's lab work today shows a downtrending hemoglobin was 9.2 yesterday upon discharge is now down to 8.4.  Will proceed with a CT scan abdomen/pelvis with contrast to evaluate for any enlargement of the hematoma or active extravasation.  Patient's white blood cell count is elevated to 19,000 however this is unchanged from the patient's admission labs, patient completed a course of prednisone yesterday.  Patient's chemistry shows significant hyponatremia down to 124 appears to be 132 on discharge.  This could be related to the patient's prednisone use, but could also be the cause for the patient's increased fatigue and weakness since going home.  Will start the patient on normal saline fluids.  Will obtain a urinalysis.  Patient states she is not been able to urinate since 4 AM this morning.  Will obtain a postvoid bladder scan to evaluate for retention.  Given the patient's weakness hyponatremia and downtrending hemoglobin patient may require admission back to the hospital for further workup and treatment.  CT pending, patient care signed out to oncoming provider.  Patient's blood pressure has improved to 113 systolic without intervention (prior to fluids).  FINAL CLINICAL IMPRESSION(S) / ED DIAGNOSES   Anemia Hyponatremia Weakness  Note:   This document was prepared using Dragon voice recognition software and may include unintentional dictation errors.   Minna Antis, MD 09/25/23 1440

## 2023-09-25 NOTE — ED Notes (Signed)
 Patient refused tap water enema at this time.

## 2023-09-25 NOTE — ED Notes (Signed)
Pt transported CT at this time.  

## 2023-09-25 NOTE — ED Triage Notes (Signed)
Refer to First Nurse Note

## 2023-09-25 NOTE — ED Notes (Addendum)
 Pt up to the bedside commode, pt able to stand and pivot without assistance. Pt urinated with no issues, bladder scan completed.  Post void reports 10mL, states the pain is now subsided after she urinated.

## 2023-09-25 NOTE — Progress Notes (Signed)
 Orthopedic Tech Progress Note Patient Details:  Kathryn Wise February 26, 1930 [id number]  Called in order to [employer] for a STAT TLSO BRACE   Patient ID: Kathryn Wise, female   DOB: [birth year], 89 y.o.   MRN: [id number]  Donald Pore 09/25/2023, 6:12 PM

## 2023-09-25 NOTE — H&P (Signed)
 History and Physical    Patient: Kathryn Wise WUJ:[id number] DOB: 02/09/1930 DOA: 09/25/2023 DOS: the patient was seen and examined on 09/25/2023 PCP: Enid Baas, MD  Patient coming from: Home - lives alone; NOK: Rebeca Alert, [phone]   Chief Complaint: Back pain  HPI: [name] is a 89 y.o. female with medical history significant of collagen vascular disease, HTN, and hypothyroidism who presented on 2/26 with back pain.  She was last hospitalized from 2/23-25 with a rectus sheath hematoma.  During the hospitalization, she got up despite being ordered for bed rest and she fell in the floor.  She felt great yesterday at the time of admission and wanted to go home.  She had constipation issues but had a BM prior to leaving.  However, she has had persistent urinary hesitancy and dysuria over the last few days and has not had that much to eat and drink.  She awoke this AM and her back was so painful that she could barely stand.  She now believes that she will need SNF rehab.  She is having ongoing LBP at this time.    ER Course:  T12 burst fracture - fell during previous admission, pain since.  Poor PO intake.  Also with UTI.  Dr. Katrinka Blazing recommends MRI, TLSO, and pain control.     Review of Systems: As mentioned in the history of present illness. All other systems reviewed and are negative. Past Medical History:  Diagnosis Date   Arthritis    Collagen vascular disease (HCC)    Dysphagia, pharyngoesophageal phase 09/10/2013   Upper GI study with barium swallow was done at  [facility] Mar 2015   No reflux seen  Small irreducible hiatal hernia  Mild changes of presbyeophagus (abnormal contractions of the esophagus that occur with aging) No strictures Normal gastric emptying  Incomplete visualization of stomach fold due to patient's inability turn     GERD (gastroesophageal reflux disease)    Heart murmur    has had years and years   History of shingles Dec 2013    treated with steroids , post op from shoulder surgery   History of squamous cell carcinoma 05/02/2016   right mid lateral pretibial   Hypertension    Hypothyroidism    Major depressive disorder, single episode 11/05/2015   Osteoarthritis of left shoulder 07/14/2012   Pneumonia 10/21/2020   Squamous cell carcinoma of skin 05/12/2016   R mid lat pretibial - other skin cancers treated by Dr. Jarold Motto   Squamous cell carcinoma of skin 08/22/2020   left distal medial popliteal - [facility]   Past Surgical History:  Procedure Laterality Date   ARTHOSCOPIC ROTAOR CUFF REPAIR     LEFT  10+  YEARS     CAROTID PTA/STENT INTERVENTION Left 10/19/2020   Procedure: CAROTID PTA/STENT INTERVENTION;  Surgeon: Renford Dills, MD;  Location: [facility] INVASIVE CV LAB;  Service: Cardiovascular;  Laterality: Left;   CATARACT EXTRACTION W/ INTRAOCULAR LENS IMPLANT     RIGHT EYE   CATARACT EXTRACTION W/PHACO Left 03/28/2016   Procedure: CATARACT EXTRACTION PHACO AND INTRAOCULAR LENS PLACEMENT (IOC);  Surgeon: Lockie Mola, MD;  Location: [facility];  Service: Ophthalmology;  Laterality: Left;  TORIC   FACELIFT     FOOT SURGERY     rt foot   TUMOR REMOVED   JOINT REPLACEMENT Left 06/2012   shoulder   LEFT HEART CATH AND CORONARY ANGIOGRAPHY N/A 06/12/2017   Procedure: LEFT HEART CATH AND CORONARY ANGIOGRAPHY;  Surgeon: Antonieta Iba, MD;  Location: [facility] INVASIVE CV LAB;  Service: Cardiovascular;  Laterality: N/A;   RENAL ANGIOGRAPHY Left 01/07/2019   Procedure: RENAL ANGIOGRAPHY;  Surgeon: Renford Dills, MD;  Location: [facility] INVASIVE CV LAB;  Service: Cardiovascular;  Laterality: Left;   RENAL ARTERY STENT     SKIN CANCER EXCISION     TOTAL SHOULDER ARTHROPLASTY  07/14/2012   Procedure: TOTAL SHOULDER ARTHROPLASTY;  Surgeon: Eulas Post, MD;  Location: [facility] OR;  Service: Orthopedics;  Laterality: Left;   Social History:  reports that she quit smoking about 51 years ago. Her smoking use  included cigarettes. She has never used smokeless tobacco. She reports current alcohol use. She reports that she does not use drugs.  Allergies  Allergen Reactions   Alendronate Swelling    And nausea     Codeine    Lisinopril     Spike in BP   Metoprolol Other (See Comments)    Lethargy - PT CURRENTLY TAKING    Oxycodone     Other reaction(s): Other (See Comments) Altered mental status   Oxycontin [Oxycodone Hcl]     Altered mental status    Family History  Problem Relation Age of Onset   Cancer Daughter        Breast   Breast cancer Daughter 84   Cancer Son     Prior to Admission medications   Medication Sig Start Date End Date Taking? Authorizing Provider  albuterol (VENTOLIN HFA) 108 (90 Base) MCG/ACT inhaler Inhale into the lungs. 09/12/23   [provider]  amLODipine (NORVASC) 5 MG tablet Take 1 tablet (5 mg total) by mouth as needed (Once daily AS NEEDED for SBP (top blood pressure number) is greater than 160.). 11/30/22   Sondra Barges, PA-C  aspirin EC 81 MG tablet Take 81 mg by mouth daily.    [provider]  benzonatate (TESSALON) 200 MG capsule Take by mouth. 09/12/23   [provider]  bisoprolol (ZEBETA) 5 MG tablet Take 1 tablet (5 mg total) by mouth every other day. 08/05/23 11/03/23  Sondra Barges, PA-C  chlorpheniramine-HYDROcodone (TUSSIONEX) 10-8 MG/5ML Take by mouth. 09/12/23   [provider]  Cholecalciferol (VITAMIN D-3) 1000 units CAPS Take 1,000 Units by mouth daily.    [provider]  cyanocobalamin (VITAMIN B12) 1000 MCG tablet Take 1,000 mcg by mouth daily.    [provider]  levothyroxine (SYNTHROID) 75 MCG tablet Take 75 mcg by mouth daily. 04/10/23   [provider]  lidocaine (LIDODERM) 5 % Place 1 patch onto the skin daily. Remove & Discard patch within 12 hours or as directed by MD 09/25/23   Enedina Finner, MD  midodrine (PROAMATINE) 5 MG tablet Take 1 tablet (5 mg total) by mouth 3 (three)  times daily with meals for 5 days. 09/24/23 09/29/23  Enedina Finner, MD  nitroGLYCERIN (NITROSTAT) 0.4 MG SL tablet Place 1 tablet (0.4 mg total) under the tongue every 5 (five) minutes as needed for chest pain. 08/18/22   Willeen Niece, MD  omeprazole (PRILOSEC) 20 MG capsule TAKE 1 CAPSULE(20 MG) BY MOUTH EVERY MORNING AS NEEDED FOR HEARTBURN 06/13/22   Sherlene Shams, MD  polyethylene glycol (MIRALAX / GLYCOLAX) 17 g packet Take 17 g by mouth daily as needed for moderate constipation. 09/24/23   Enedina Finner, MD  rosuvastatin (CRESTOR) 10 MG tablet Take 1 tablet (10 mg total) by mouth at bedtime. 03/12/22   Sherlene Shams, MD  temazepam (RESTORIL) 15 MG capsule TAKE 1 CAPSULE BY MOUTH AT BEDTIME AS NEEDED SLEEP 03/05/22   Sherlene Shams, MD  Turmeric 500 MG CAPS Take 500 mg by mouth daily.    [provider]  XIIDRA 5 % SOLN Apply 1 drop to eye 2 (two) times daily. 06/17/23   [provider]    Physical Exam: Vitals:   09/25/23 1236 09/25/23 1600 09/25/23 1706 09/25/23 1800  BP:  139/67  122/66  Pulse:  100  (!) 103  Resp:  17  (!) 21  Temp:   98 F (36.7 C)   TempSrc:   Oral   SpO2:  90%  91%  Weight: 68 kg      General:  Appears calm and comfortable and is in NAD Eyes:  EOMI, normal lids, iris ENT:  grossly normal hearing, lips & tongue, mmm Neck:  no LAD, masses or thyromegaly Cardiovascular:  RRR, no m/r/g. No LE edema.  Respiratory:   CTA bilaterally with no wheezes/rales/rhonchi.  Normal respiratory effort. Abdomen:  soft, NT, ND; lidoderm patch in place with point tenderness along L periumbilical region Skin:  no rash or induration seen on limited exam Musculoskeletal:  grossly normal tone BUE/BLE, good ROM, no bony abnormality Psychiatric:  grossly normal mood and affect, speech fluent and appropriate, AOx3 Neurologic:  CN 2-12 grossly intact, moves all extremities in coordinated fashion   Radiological Exams on Admission: Independently reviewed - see  discussion in A/P where applicable  CT ABDOMEN PELVIS W CONTRAST Result Date: 09/25/2023 CLINICAL DATA:  Abdominal pain EXAM: CT ABDOMEN AND PELVIS WITH CONTRAST TECHNIQUE: Multidetector CT imaging of the abdomen and pelvis was performed using the standard protocol following bolus administration of intravenous contrast. RADIATION DOSE REDUCTION: This exam was performed according to the departmental dose-optimization program which includes automated exposure control, adjustment of the mA and/or kV according to patient size and/or use of iterative reconstruction technique. CONTRAST:  OMNIPAQUE IOHEXOL 300 MG/ML  SOLN COMPARISON:  09/22/2023 FINDINGS: Lower chest: Trace bilateral pleural effusions. Dependent airspace consolidation in the left lower lobe. Moderate hiatal hernia. Hepatobiliary: No focal liver abnormality is seen. No gallstones, gallbladder wall thickening, or biliary dilatation. Pancreas: No new or acute abnormality. Spleen: Normal in size without focal abnormality. Adrenals/Urinary Tract: Adrenal glands are unremarkable. Kidneys are normal, without renal calculi, focal lesion, or hydronephrosis. Small amount of air within the urinary bladder. No appreciable bladder wall thickening. Stomach/Bowel: Moderate hiatal hernia. Stomach otherwise within normal limits. No evidence of bowel obstruction or active bowel inflammation. Moderate volume stool within the colon. Vascular/Lymphatic: Aortic atherosclerosis. No enlarged abdominal or pelvic lymph nodes. Reproductive: Fibroid uterus.  No adnexal masses. Other: No ascites or pneumoperitoneum. Musculoskeletal: Redemonstrated left rectus sheath hematoma measuring approximately 12.2 x 4.8 x 8.0 cm (previously 12.2 x 6.3 x 7.6 cm). Acute burst-type fracture of the T12 vertebral body with up to 40% vertebral body height loss. Approximately 3 mm of bony retropulsion. No evidence of fracture extension into the posterior elements. IMPRESSION: 1. Acute  burst-type fracture of the T12 vertebral body with up to 40% vertebral body height loss. Approximately 3 mm of bony retropulsion. This is new from the prior study obtained 3 days ago. 2. Grossly stable appearance of left rectus sheath hematoma measuring approximately 12.2 x 4.8 x 8.0 cm (previously 12.2 x 6.3 x 7.6 cm). 3. Trace bilateral pleural effusions. Dependent airspace consolidation in the left lower lobe, which may represent atelectasis or pneumonia. 4. Small amount of air  within the urinary bladder. Correlate for recent instrumentation. 5. Moderate hiatal hernia. 6. Fibroid uterus. 7. Aortic atherosclerosis (ICD10-I70.0). Electronically Signed   By: Duanne Guess D.O.   On: 09/25/2023 16:23    EKG: Not done   Labs on Admission: I have personally reviewed the available labs and imaging studies at the time of the admission.  Pertinent labs:    Na++ 124 Glucose 131 BUN 20/Creatinine 0.93/GFR 58 Albumin 2.6 Bili 1.8 WBC 19.3 Hgb 8.4, 9.2 on 2/24 UA: small Hgb, moderate LE, + nitrite, many bacteria   Assessment and Plan: Principal Problem:   T12 burst fracture (HCC) Active Problems:   UTI (urinary tract infection)   Hypothyroidism   Labile hypertension   Rectus sheath hematoma, subsequent encounter   Orthostatic hypotension    T12 burst fracture 40% height loss Mechanical fall resulting in thoracic compression fracture, new from prior imaging 3 days ago Neurosurgery consulted, likely conservative management MRI thoracic and lumbar spines ordered Will order TLSO Consider IR consult for kyphoplasty but will await neurosurgery input Pain control with morphine, Norco, and lidocaine patch  PT/OT consults She appears likely to need SNF rehab  UTI H/o recurrent UTI, had foley during last hospitalization, and now with hesitation and dysuria UA suggestive of UTI Will order culture Ceftriaxone daily for now  Hyponatremia Mild hyponatremia during last  hospitalization Poor PO intake, likely hypovolemic IVF Recheck in AM It is not currently in the range of concern for rapid overcorrection  Rectus sheath hematoma with ABLA Suspected to be from intractable coughing due to bronchitis prior to last admission Stable from prior IR consulted previously - opinion is the source of bleeding comes from small caliber muscular branch artery, challenging to reach and perform embolization IR and general surgeon recommended conservative management, pain control, series of H&H and transfuse for hemodynamic instability Hgb is slightly lower than prior but still >7, no indication for transfusion Abdominal binder Lidocaine patch Continue to hold ASA  Hypotension with h/o HTN Marginal BPs in the ER today Orthostatic hypotension during last admission led to holding HTN medications and starting midodrine Continue midodrine, hold BP meds till sbp >120 Left renal artery stenosis status post stenting in June 2020 History of atrial tachycardia Takes bisoprolol and as needed amlodipine Continue to hold bisoprolol for now   Hypothyroidism Continue Synthroid   HLD Continue rosuvastatin  Constipation Continue bowel regimen  Dysphagia Chronic Soft diet ordered  DNR Confirmed on admission     Advance Care Planning:   Code Status: Limited: Do not attempt resuscitation (DNR) -DNR-LIMITED -Do Not Intubate/DNI    Consults: Neurosurgery; PT/OT; TOC team  DVT Prophylaxis: SCDs  Family Communication: Granddaughter was present throughout evaluation  Severity of Illness: The appropriate patient status for this patient is INPATIENT. Inpatient status is judged to be reasonable and necessary in order to provide the required intensity of service to ensure the patient's safety. The patient's presenting symptoms, physical exam findings, and initial radiographic and laboratory data in the context of their chronic comorbidities is felt to place them at high risk  for further clinical deterioration. Furthermore, it is not anticipated that the patient will be medically stable for discharge from the hospital within 2 midnights of admission.   * I certify that at the point of admission it is my clinical judgment that the patient will require inpatient hospital care spanning beyond 2 midnights from the point of admission due to high intensity of service, high risk for further deterioration and high frequency  of surveillance required.*  Author: Jonah Blue, MD 09/25/2023 6:45 PM  For on call review [email or web address].

## 2023-09-25 NOTE — ED Notes (Signed)
 Dr. Lenard Lance, MD at bedside for evaluation.

## 2023-09-25 NOTE — ED Notes (Signed)
 Order called to [facility] for TLSO Brace , talked with Day

## 2023-09-25 NOTE — ED Triage Notes (Signed)
 First Nurse Note" Patient to ED via [facility] from home for hypotension and dizziness when not laying down. PT dc'd from hospital yesterday for same and a torn abd muscle. AOx4  20 R forearm  118/54 80 HR 95% RA

## 2023-09-25 NOTE — ED Notes (Addendum)
 Pt via POV from home. Pt c/o weakness. Reports that she was recently admitted to the hospital for internal bleeding from an abdominal muscle tear. Reports that she has been getting weaker and c/o back pain. Reports she has been having some issues with urinating,

## 2023-09-26 DIAGNOSIS — W19XXXA Unspecified fall, initial encounter: Secondary | ICD-10-CM | POA: Diagnosis not present

## 2023-09-26 DIAGNOSIS — S22081A Stable burst fracture of T11-T12 vertebra, initial encounter for closed fracture: Secondary | ICD-10-CM | POA: Diagnosis not present

## 2023-09-26 LAB — CBC
HCT: 23.5 % — ABNORMAL LOW (ref 36.0–46.0)
Hemoglobin: 7.7 g/dL — ABNORMAL LOW (ref 12.0–15.0)
MCH: 27.3 pg (ref 26.0–34.0)
MCHC: 32.8 g/dL (ref 30.0–36.0)
MCV: 83.3 fL (ref 80.0–100.0)
Platelets: 169 10*3/uL (ref 150–400)
RBC: 2.82 MIL/uL — ABNORMAL LOW (ref 3.87–5.11)
RDW: 16.4 % — ABNORMAL HIGH (ref 11.5–15.5)
WBC: 16.1 10*3/uL — ABNORMAL HIGH (ref 4.0–10.5)
nRBC: 0 % (ref 0.0–0.2)

## 2023-09-26 LAB — BASIC METABOLIC PANEL
Anion gap: 7 (ref 5–15)
BUN: 15 mg/dL (ref 8–23)
CO2: 22 mmol/L (ref 22–32)
Calcium: 7.6 mg/dL — ABNORMAL LOW (ref 8.9–10.3)
Chloride: 97 mmol/L — ABNORMAL LOW (ref 98–111)
Creatinine, Ser: 0.8 mg/dL (ref 0.44–1.00)
GFR, Estimated: >60 mL/min (ref >60)
Glucose, Bld: 102 mg/dL — ABNORMAL HIGH (ref 70–99)
Potassium: 4.1 mmol/L (ref 3.5–5.1)
Sodium: 126 mmol/L — ABNORMAL LOW (ref 135–145)

## 2023-09-26 MED ORDER — METHOCARBAMOL 1000 MG/10ML IJ SOLN: 500.0000 mg | Freq: Four times a day (QID) | INTRAMUSCULAR | Status: DC | PRN

## 2023-09-26 NOTE — TOC Initial Note (Signed)
 Transition of Care [facility]) - Initial/Assessment Note    Patient Details  Name: Kathryn Wise MRN: [id number] Date of Birth: [birth year]  [facility] CM/SW Contact:    Marlowe Sax, RN Phone Number: 09/26/2023, 12:39 PM  Clinical Narrative:                 Met with the patient and her grand daughter in the room at the bedside She lives alone and still drives, she is not able to return home alone at this time, will need tobe stronger first, agreeable to go to STR, Bedsearch completed and PASSR obtained, Fl2 completed  Expected Discharge Plan: Skilled Nursing Facility Barriers to Discharge: SNF Pending bed offer   Patient Goals and CMS Choice            Expected Discharge Plan and Services   Discharge Planning Services: CM Consult   Living arrangements for the past 2 months: Single Family Home                                      Prior Living Arrangements/Services Living arrangements for the past 2 months: Single Family Home Lives with:: Self              Current home services: DME    Activities of Daily Living   ADL Screening (condition at time of admission) Independently performs ADLs?: No Does the patient have a NEW difficulty with bathing/dressing/toileting/self-feeding that is expected to last >3 days?: Yes (Initiates electronic notice to provider for possible OT consult) Does the patient have a NEW difficulty with getting in/out of bed, walking, or climbing stairs that is expected to last >3 days?: Yes (Initiates electronic notice to provider for possible PT consult) Does the patient have a NEW difficulty with communication that is expected to last >3 days?: No Is the patient deaf or have difficulty hearing?: No Does the patient have difficulty seeing, even when wearing glasses/contacts?: No Does the patient have difficulty concentrating, remembering, or making decisions?: No  Permission Sought/Granted                   Emotional Assessment              Admission diagnosis:  Hyponatremia [E87.1] Weakness [R53.1] T12 burst fracture (HCC) [S22.081A] Anemia, unspecified type [D64.9] Patient Active Problem List   Diagnosis Date Noted   T12 burst fracture (HCC) 09/25/2023   Orthostatic hypotension 09/23/2023   Rectus sheath hematoma, subsequent encounter 09/22/2023   Symptomatic anemia 09/22/2023   Obstructive hypertrophic cardiomyopathy (HCC) 03/02/2023   UTI (urinary tract infection) 03/01/2023   NSTEMI (non-ST elevated myocardial infarction) (HCC) 03/01/2023   Acute heart failure with preserved ejection fraction (HFpEF) (HCC) 03/01/2023   Leukocytosis 03/01/2023   Rigors 03/01/2023   Demand ischemia (HCC) 03/01/2023   Paroxysmal atrial flutter (HCC) 08/18/2022   Near syncope 08/18/2022   Paroxysmal tachycardia (HCC) 08/18/2022   HOCM (hypertrophic obstructive cardiomyopathy) (HCC) 08/18/2022   Labile hypertension 08/18/2022   Chest pain 08/18/2022   Paroxysmal atrial fibrillation with RVR (HCC) 08/17/2022   Subdural hematoma (HCC) 11/14/2021   Iron deficiency anemia, unspecified 10/12/2021   Cerumen impaction 10/10/2021   Prediabetes 10/10/2021   Polyarthralgia 09/26/2021   Decreased glomerular filtration rate (GFR) 04/01/2021   Aortic atherosclerosis (HCC) 03/28/2021   Hyponatremia 10/21/2020   Elevated troponin 10/21/2020   Carotid stenosis, symptomatic w/o infarct, bilateral 10/19/2020  Carotid stenosis 09/12/2020   Urinary incontinence, urge 05/05/2020   Arteriosclerotic cerebrovascular disease 05/05/2020   Recurrent occipital headache 03/07/2019   Sensorineural hearing loss (SNHL) of left ear 01/23/2019   Renal artery stenosis (HCC) 01/01/2019   Lymphedema 01/01/2019   PAD (peripheral artery disease) (HCC) 01/01/2019   History of TIA (transient ischemic attack) 11/13/2018   Tinnitus aurium, bilateral 10/19/2018   Blurred vision, bilateral 10/19/2018   Insomnia due to  psychological stress 10/07/2018   Food intolerance in adult 10/07/2018   Myalgia due to statin 12/10/2017   Hyperlipidemia LDL goal <130 10/03/2017   Hiatal hernia 07/03/2017   CAD (coronary atherosclerotic disease) 07/03/2017   Hospital discharge follow-up 07/03/2017   Chest pain due to CAD (HCC) 06/10/2017   Pain in left wrist 09/20/2016   Breast cancer screening by mammogram 09/20/2016   Precordial pain 05/31/2016   Hypothyroidism 04/28/2015   Renovascular hypertension 01/13/2014   Osteopenia 10/25/2013   Disorder of bone and cartilage 10/25/2013   GERD (gastroesophageal reflux disease) 09/11/2013   Encounter for preventive health examination 09/11/2013   Heart murmur 11/28/2012   History of shingles    Peripheral edema 11/08/2012   Osteoarthritis of left shoulder 07/14/2012   PCP:  Enid Baas, MD Pharmacy:   [facility] [phone] - [place], [place] - [address] AT [facility] OF [address] [place] [address] Phone: [phone] Fax: [phone]     Social Drivers of Health (SDOH) Social History: SDOH Screenings   Food Insecurity: No Food Insecurity (09/25/2023)  Housing: Low Risk  (09/25/2023)  Transportation Needs: No Transportation Needs (09/25/2023)  Utilities: Not At Risk (09/25/2023)  Depression (PHQ2-9): Low Risk  (10/10/2021)  Recent Concern: Depression (PHQ2-9) - Medium Risk (09/26/2021)  Financial Resource Strain: Low Risk  (09/12/2023)   Received from [facility]  Physical Activity: Sufficiently Active (04/20/2020)  Social Connections: Moderately Isolated (09/25/2023)  Stress: No Stress Concern Present (04/20/2020)  Tobacco Use: Medium Risk (09/25/2023)   SDOH Interventions:     Readmission Risk Interventions     No data to display

## 2023-09-26 NOTE — Consult Note (Signed)
 Consulting Department:  Inpatient medicine  Primary Physician:  Enid Baas, MD  Chief Complaint: T12 compression fracture  History of Present Illness: 09/26/2023 Kathryn Wise is a 89 y.o. female who presents with the chief complaint of severe back pain secondary to T12 compression fracture.  She was in her usual state of health until she was recently admitted for an abdominal wall tear after severe coughing bout.  She was discharged home but had a fall during her hospitalization with no evidence of cervical or thoracic spine fracture.  She came back in with intractable back pain in the midline with no radiation down her legs.  No new numbness weakness or tingling.  No new neurologic deficits.  No new changes in her bowel or bladder history.  She feels worse when she is upright or moves.  Feels better when she is recumbent.  She has a history of osteoarthritis but has not been diagnosed with osteoporosis.  When we discussed with her she is not interested in any interventional treatments.  She does have a history of heart failure.  She states that even if she was recommended for surgery she would not want it.  The symptoms are causing a significant impact on the patient's life.   Review of Systems:  A 10 point review of systems is negative, except for the pertinent positives and negatives detailed in the HPI.  Past Medical History: Past Medical History:  Diagnosis Date   Arthritis    Collagen vascular disease (HCC)    Dysphagia, pharyngoesophageal phase 09/10/2013   Upper GI study with barium swallow was done at  [facility] Mar 2015   No reflux seen  Small irreducible hiatal hernia  Mild changes of presbyeophagus (abnormal contractions of the esophagus that occur with aging) No strictures Normal gastric emptying  Incomplete visualization of stomach fold due to patient's inability turn     GERD (gastroesophageal reflux disease)    Heart murmur    has had years and years   History of  shingles Dec 2013   treated with steroids , post op from shoulder surgery   History of squamous cell carcinoma 05/02/2016   right mid lateral pretibial   Hypertension    Hypothyroidism    Major depressive disorder, single episode 11/05/2015   Osteoarthritis of left shoulder 07/14/2012   Pneumonia 10/21/2020   Squamous cell carcinoma of skin 05/12/2016   R mid lat pretibial - other skin cancers treated by Dr. Jarold Motto   Squamous cell carcinoma of skin 08/22/2020   left distal medial popliteal - [facility]    Past Surgical History: Past Surgical History:  Procedure Laterality Date   ARTHOSCOPIC ROTAOR CUFF REPAIR     LEFT  10+  YEARS     CAROTID PTA/STENT INTERVENTION Left 10/19/2020   Procedure: CAROTID PTA/STENT INTERVENTION;  Surgeon: Renford Dills, MD;  Location: [facility] INVASIVE CV LAB;  Service: Cardiovascular;  Laterality: Left;   CATARACT EXTRACTION W/ INTRAOCULAR LENS IMPLANT     RIGHT EYE   CATARACT EXTRACTION W/PHACO Left 03/28/2016   Procedure: CATARACT EXTRACTION PHACO AND INTRAOCULAR LENS PLACEMENT (IOC);  Surgeon: Lockie Mola, MD;  Location: [facility];  Service: Ophthalmology;  Laterality: Left;  TORIC   FACELIFT     FOOT SURGERY     rt foot   TUMOR REMOVED   JOINT REPLACEMENT Left 06/2012   shoulder   LEFT HEART CATH AND CORONARY ANGIOGRAPHY N/A 06/12/2017   Procedure: LEFT HEART CATH AND CORONARY ANGIOGRAPHY;  Surgeon: Mariah Milling,  Tollie Pizza, MD;  Location: [facility] INVASIVE CV LAB;  Service: Cardiovascular;  Laterality: N/A;   RENAL ANGIOGRAPHY Left 01/07/2019   Procedure: RENAL ANGIOGRAPHY;  Surgeon: Renford Dills, MD;  Location: [facility] INVASIVE CV LAB;  Service: Cardiovascular;  Laterality: Left;   RENAL ARTERY STENT     SKIN CANCER EXCISION     TOTAL SHOULDER ARTHROPLASTY  07/14/2012   Procedure: TOTAL SHOULDER ARTHROPLASTY;  Surgeon: Eulas Post, MD;  Location: [facility] OR;  Service: Orthopedics;  Laterality: Left;    Allergies: Allergies as of  09/25/2023 - Review Complete 09/25/2023  Allergen Reaction Noted   Alendronate Swelling 10/08/2014   Codeine  12/07/2021   Lisinopril  11/03/2019   Metoprolol Other (See Comments) 10/03/2017   Oxycodone  07/09/2012   Oxycontin [oxycodone hcl]  07/09/2012    Medications:  Current Facility-Administered Medications:    acetaminophen (TYLENOL) tablet 650 mg, 650 mg, Oral, Q6H PRN **OR** acetaminophen (TYLENOL) suppository 650 mg, 650 mg, Rectal, Q6H PRN, Jonah Blue, MD   bisacodyl (DULCOLAX) EC tablet 5 mg, 5 mg, Oral, Daily PRN, Jonah Blue, MD   cefTRIAXone (ROCEPHIN) 1 g in sodium chloride 0.9 % 100 mL IVPB, 1 g, Intravenous, Q24H, Jonah Blue, MD   docusate sodium (COLACE) capsule 100 mg, 100 mg, Oral, BID, Jonah Blue, MD, 100 mg at 09/25/23 2207   hydrALAZINE (APRESOLINE) injection 5 mg, 5 mg, Intravenous, Q4H PRN, Jonah Blue, MD   HYDROcodone-acetaminophen (NORCO/VICODIN) 5-325 MG per tablet 1-2 tablet, 1-2 tablet, Oral, Q4H PRN, Jonah Blue, MD, 1 tablet at 09/25/23 1923   lactated ringers infusion, , Intravenous, Continuous, Jonah Blue, MD, Last Rate: 75 mL/hr at 09/26/23 0355, Infusion Verify at 09/26/23 0355   levothyroxine (SYNTHROID) tablet 75 mcg, 75 mcg, Oral, QHS, Jonah Blue, MD, 75 mcg at 09/25/23 2207   lidocaine (LIDODERM) 5 % 1-3 patch, 1-3 patch, Transdermal, Q24H, Jonah Blue, MD, 1 patch at 09/25/23 1919   midodrine (PROAMATINE) tablet 5 mg, 5 mg, Oral, TID WC, Jonah Blue, MD   morphine (PF) 2 MG/ML injection 2 mg, 2 mg, Intravenous, Q2H PRN, Jonah Blue, MD, 2 mg at 09/25/23 2203   ondansetron (ZOFRAN) tablet 4 mg, 4 mg, Oral, Q6H PRN **OR** ondansetron (ZOFRAN) injection 4 mg, 4 mg, Intravenous, Q6H PRN, Jonah Blue, MD   Oral care mouth rinse, 15 mL, Mouth Rinse, PRN, Jonah Blue, MD   pantoprazole (PROTONIX) EC tablet 40 mg, 40 mg, Oral, Daily, Jonah Blue, MD, 40 mg at 09/25/23 1923   polyethylene glycol  (MIRALAX / GLYCOLAX) packet 17 g, 17 g, Oral, Daily, Jonah Blue, MD, 17 g at 09/25/23 1922   rosuvastatin (CRESTOR) tablet 10 mg, 10 mg, Oral, QHS, Jonah Blue, MD, 10 mg at 09/25/23 2207   sodium chloride flush (NS) 0.9 % injection 3 mL, 3 mL, Intravenous, Q12H, Jonah Blue, MD, 3 mL at 09/25/23 2106   temazepam (RESTORIL) capsule 15 mg, 15 mg, Oral, QHS PRN, Jonah Blue, MD, 15 mg at 09/25/23 2207   Social History: Social History   Tobacco Use   Smoking status: Former    Current packs/day: 0.00    Types: Cigarettes    Quit date: 07/30/1972    Years since quitting: 51.1   Smokeless tobacco: Never   Tobacco comments:    smoked for only a few months  Vaping Use   Vaping status: Never Used  Substance Use Topics   Alcohol use: Yes    Comment: OCCAS WINE    Drug use: No  Family Medical History: Family History  Problem Relation Age of Onset   Cancer Daughter        Breast   Breast cancer Daughter 34   Cancer Son     Physical Examination: Vitals:   09/26/23 0037 09/26/23 0835  BP: (!) 113/54 (!) 116/53  Pulse: (!) 102 (!) 57  Resp: 18 16  Temp: 98.3 F (36.8 C) 98 F (36.7 C)  SpO2: 91% 93%     General: Patient is well developed, well nourished, calm, collected, and in no apparent distress.  NEUROLOGICAL:  General: When resting in no acute distress, however when moves in certain positions does have significant midline back pain Awake, alert, oriented to person, place, and time.  Pupils equal round and reactive to light.  Facial tone is symmetric.  Tongue protrusion is midline.  There is no pronator drift.  Strength: Antigravity in bilateral lower extremities with no obvious asymmetry or deficit noted.  She does have some pain with activation of her hip flexors but otherwise her excursion is good.  Bilateral upper and lower extremity sensation is intact to light touch.  Slight hypoflex you noted in her bilateral lower extremities  Clonus is not  present.  Toes are down-going.     Imaging: MR LUMBAR SPINE WO CONTRAST Result Date: 09/25/2023 CLINICAL DATA:  Initial evaluation for acute trauma. EXAM: MRI LUMBAR SPINE WITHOUT CONTRAST TECHNIQUE: Multiplanar, multisequence MR imaging of the lumbar spine was performed. No intravenous contrast was administered. COMPARISON:  CT from earlier the same day. FINDINGS: Segmentation: Standard. Lowest well-formed disc space labeled the L5-S1 level. Alignment: Trace dextroscoliosis. 3 mm anterolisthesis of L4 on L5, with trace 2 mm retrolisthesis of L1 on L2 and L2 on L3. Findings chronic and facet mediated. Vertebrae: Acute burst type compression fracture involving the T12 vertebral body with 30% height loss and trace 2-3 mm bony retropulsion. No stenosis. Vertebral body height otherwise maintained without acute or chronic fracture. Bone marrow signal intensity within normal limits. No worrisome osseous lesions. Mild reactive marrow edema present about the right greater than left L5-S1 facets due to facet arthritis. Conus medullaris and cauda equina: Conus extends to the L1 level. Conus and cauda equina appear normal. Paraspinal and other soft tissues: Unremarkable. Disc levels: L1-2: Disc desiccation with diffuse disc bulge. Reactive endplate spurring. Superimposed tiny central disc protrusion. Mild bilateral facet spurring. No spinal stenosis. Foramina remain patent. L2-3: Degenerate intervertebral disc space narrowing with disc desiccation and diffuse disc bulge. Reactive endplate spurring. No significant spinal stenosis. Foramina remain adequately patent. L3-4: Mild degenerative disc space narrowing with disc desiccation and diffuse disc bulge. Reactive endplate spurring, greater on the left. Mild bilateral facet hypertrophy. No significant spinal stenosis. Mild right with mild to moderate left L3 foraminal stenosis. L4-5: Disc desiccation with diffuse disc bulge. Moderate facet and ligament flavum hypertrophy.  Associated small joint effusions. Resultant mild narrowing of the lateral recesses bilaterally. Central canal remains patent. No significant foraminal stenosis. L5-S1: Disc desiccation with mild disc bulge. Disc bulging slightly asymmetric to the right. Right-sided reactive endplate spurring. Moderate right worse than left facet arthrosis with reactive marrow edema and small joint effusions. Mild narrowing of the lateral recesses bilaterally. Central canal remains patent. Mild to moderate right L5 foraminal stenosis. Left neural foramina remains patent. IMPRESSION: 1. Acute burst type compression fracture involving the T12 vertebral body with 30% height loss and trace 2-3 mm bony retropulsion. No significant stenosis. 2. No other acute traumatic injury within the lumbar spine.  3. Mild to moderate left L3 and right L5 foraminal stenosis related to disc bulge and facet hypertrophy. 4. Moderate right worse than left facet arthrosis at L5-S1 with associated reactive marrow edema. Finding could contribute to lower back pain. Electronically Signed   By: Rise Mu M.D.   On: 09/25/2023 23:28   MR THORACIC SPINE WO CONTRAST Result Date: 09/25/2023 CLINICAL DATA:  Initial evaluation for acute trauma, abnormal CT. EXAM: MRI THORACIC SPINE WITHOUT CONTRAST TECHNIQUE: Multiplanar, multisequence MR imaging of the thoracic spine was performed. No intravenous contrast was administered. COMPARISON:  CT from earlier the same day. FINDINGS: Alignment: Vertebral bodies normally aligned with preservation of the normal thoracic kyphosis. No listhesis. Vertebrae: Acute burst type compression fracture involving the T12 vertebral body again seen, corresponding with abnormality on prior CT. Associated central height loss measures up to 30% with trace 2-3 mm bony retropulsion. No stenosis. This is benign/mechanical in appearance. Vertebral body height otherwise maintained with no other acute or chronic fracture. Bone marrow  signal intensity within normal limits. No discrete or worrisome osseous lesions. No other abnormal marrow edema. Cord:  Normal signal and morphology. Paraspinal and other soft tissues: Mild edema seen within the paraspinous soft tissues adjacent to the T12 fracture. Major ligamentous structures appear intact. Moderate layering bilateral pleural effusions, right greater than left. Aortic atherosclerosis. Disc levels: No significant disc pathology seen within the thoracic spine for age. No spinal stenosis. Foramina remain patent. IMPRESSION: 1. Acute burst type compression fracture involving the T12 vertebral body with up to 30% height loss and trace 2-3 mm bony retropulsion. No significant stenosis. This is benign/mechanical in appearance. 2. No other acute traumatic injury within the thoracic spine. 3. Moderate layering bilateral pleural effusions, right greater than left. 4.  Aortic Atherosclerosis (ICD10-I70.0). Electronically Signed   By: Rise Mu M.D.   On: 09/25/2023 23:22   CT ABDOMEN PELVIS W CONTRAST Result Date: 09/25/2023 CLINICAL DATA:  Abdominal pain EXAM: CT ABDOMEN AND PELVIS WITH CONTRAST TECHNIQUE: Multidetector CT imaging of the abdomen and pelvis was performed using the standard protocol following bolus administration of intravenous contrast. RADIATION DOSE REDUCTION: This exam was performed according to the departmental dose-optimization program which includes automated exposure control, adjustment of the mA and/or kV according to patient size and/or use of iterative reconstruction technique. CONTRAST:  OMNIPAQUE IOHEXOL 300 MG/ML  SOLN COMPARISON:  09/22/2023 FINDINGS: Lower chest: Trace bilateral pleural effusions. Dependent airspace consolidation in the left lower lobe. Moderate hiatal hernia. Hepatobiliary: No focal liver abnormality is seen. No gallstones, gallbladder wall thickening, or biliary dilatation. Pancreas: No new or acute abnormality. Spleen: Normal in size  without focal abnormality. Adrenals/Urinary Tract: Adrenal glands are unremarkable. Kidneys are normal, without renal calculi, focal lesion, or hydronephrosis. Small amount of air within the urinary bladder. No appreciable bladder wall thickening. Stomach/Bowel: Moderate hiatal hernia. Stomach otherwise within normal limits. No evidence of bowel obstruction or active bowel inflammation. Moderate volume stool within the colon. Vascular/Lymphatic: Aortic atherosclerosis. No enlarged abdominal or pelvic lymph nodes. Reproductive: Fibroid uterus.  No adnexal masses. Other: No ascites or pneumoperitoneum. Musculoskeletal: Redemonstrated left rectus sheath hematoma measuring approximately 12.2 x 4.8 x 8.0 cm (previously 12.2 x 6.3 x 7.6 cm). Acute burst-type fracture of the T12 vertebral body with up to 40% vertebral body height loss. Approximately 3 mm of bony retropulsion. No evidence of fracture extension into the posterior elements. IMPRESSION: 1. Acute burst-type fracture of the T12 vertebral body with up to 40% vertebral body height  loss. Approximately 3 mm of bony retropulsion. This is new from the prior study obtained 3 days ago. 2. Grossly stable appearance of left rectus sheath hematoma measuring approximately 12.2 x 4.8 x 8.0 cm (previously 12.2 x 6.3 x 7.6 cm). 3. Trace bilateral pleural effusions. Dependent airspace consolidation in the left lower lobe, which may represent atelectasis or pneumonia. 4. Small amount of air within the urinary bladder. Correlate for recent instrumentation. 5. Moderate hiatal hernia. 6. Fibroid uterus. 7. Aortic atherosclerosis (ICD10-I70.0). Electronically Signed   By: Duanne Guess D.O.   On: 09/25/2023 16:23     I have personally reviewed the images and agree with the above interpretation.  Labs:    Latest Ref Rng & Units 09/26/2023    4:41 AM 09/25/2023   12:38 PM 09/24/2023    6:10 AM  CBC  WBC 4.0 - 10.5 K/uL 16.1  19.3    Hemoglobin 12.0 - 15.0 g/dL 7.7  8.4   9.2   Hematocrit 36.0 - 46.0 % 23.5  25.8    Platelets 150 - 400 K/uL 169  169        Latest Ref Rng & Units 09/26/2023    4:41 AM 09/25/2023   12:38 PM 09/22/2023    5:05 AM  BMP  Glucose 70 - 99 mg/dL 161  096  045   BUN 8 - 23 mg/dL 15  20  25    Creatinine 0.44 - 1.00 mg/dL 4.09  8.11  9.14   Sodium 135 - 145 mmol/L 126  124  132   Potassium 3.5 - 5.1 mmol/L 4.1  4.2  4.0   Chloride 98 - 111 mmol/L 97  92  101   CO2 22 - 32 mmol/L 22  23  24    Calcium 8.9 - 10.3 mg/dL 7.6  8.0  8.8         Assessment and Plan: Ms. Bramblett is a pleasant 89 y.o. female with recent fall and intractable back pain.  Was brought to the hospital for further evaluation.  Was found to have a compression fracture in her lumbar spine.  There was slight retropulsion so an MRI was performed.  This did not demonstrate any severe or noticeable neurologic compression or stenosis.  The ligamentum and ligaments appear to be intact.  At this point the fracture appears to be stable in its morphology.  Is likely causing her midline back pain as it correlates clearly with her imaging.  She states that given her age and history of heart failure she is not interested in any surgical or interventional approaches to her pain.  We did discuss that some patients will move forward towards having procedure like a kyphoplasty with the radiology department, however at this point she is not interested.  She can follow-up with [place] in clinic in 2 to 4 weeks with upright x-rays.  Will have her continue to wear her brace while upright.  She does not need it while she is in the bed.  Lovenia Kim, MD/MSCR Dept. of Neurosurgery

## 2023-09-26 NOTE — Plan of Care (Signed)

## 2023-09-26 NOTE — Progress Notes (Signed)
 Progress Note   Patient: Kathryn Wise WGN:[id number] DOB: [birth year] DOA: 09/25/2023     1 DOS: the patient was seen and examined on 09/26/2023   Brief hospital course: 89yo with h/o collagen vascular disease, HTN, and hypothyroidism who presented on 2/26 with back pain. She was last hospitalized from 2/23-25 with a rectus sheath hematoma which appears to be stable.  T12 burst fracture, neurosurgery consulted, TLSO placed.  Likely to need SNF rehab.  Assessment and Plan:  T12 burst fracture 40% height loss Mechanical fall resulting in thoracic compression fracture, new from prior imaging 3 days ago Neurosurgery consulted, likely conservative management MRI thoracic and lumbar spines ordered and show similar findings as well as R > L facet arthrosis at L5-S1 with reactive marrow edema TLSO while upright Consider IR consult for kyphoplasty but patient prefers non-interventional approach Pain control with morphine, Norco, and lidocaine patch; add methocarbamol PT/OT consults She appears likely to need SNF rehab F/u with neurosurgery in 2-4 weeks   UTI H/o recurrent UTI, had foley during last hospitalization, and now with hesitation and dysuria UA suggestive of UTI Culture is pending Ceftriaxone daily for now No urinary complaints reports today   Hyponatremia Mild hyponatremia during last hospitalization Poor PO intake, likely hypovolemic IVF with slight improvement Recheck in AM It is not currently in the range of concern for rapid overcorrection and is unlikely to be symptomatic at this time   Rectus sheath hematoma with ABLA Suspected to be from intractable coughing due to bronchitis prior to last admission Stable from prior IR consulted previously - opinion is the source of bleeding comes from small caliber muscular branch artery, challenging to reach and perform embolization IR and general surgeon recommended conservative management, pain control, series of H&H and transfuse  for hemodynamic instability Hgb is slightly lower than prior but still >7, no indication for transfusion Abdominal binder Lidocaine patch Continue to hold ASA   Hypotension with h/o HTN Marginal BPs in the ER today Orthostatic hypotension during last admission led to holding HTN medications and starting midodrine Continue midodrine, hold BP meds till sbp >120 Left renal artery stenosis status post stenting in June 2020 History of atrial tachycardia Takes bisoprolol and as needed amlodipine Continue to hold bisoprolol for now   Hypothyroidism Continue Synthroid   HLD Continue rosuvastatin   Constipation Continue bowel regimen   Dysphagia Chronic Soft diet ordered   DNR Confirmed on admission   Consultants: Neurosurgery PT OT TOC team  Procedures: None  Antibiotics: Ceftriaxone 2/26-   30 Day Unplanned Readmission Risk Score    Flowsheet Row ED to Hosp-Admission (Current) from 09/25/2023 in [facility] (1A)  30 Day Unplanned Readmission Risk Score (%) 19.65 Filed at 09/26/2023 0801       This score is the patient's risk of an unplanned readmission within 30 days of being discharged (0 -100%). The score is based on dignosis, age, lab data, medications, orders, and past utilization.   Low:  0-14.9   Medium: 15-21.9   High: 22-29.9   Extreme: 30 and above           Subjective: Still having significant back pain, primarily lumbar region.   Objective: Vitals:   09/26/23 0037 09/26/23 0835  BP: (!) 113/54 (!) 116/53  Pulse: (!) 102 (!) 57  Resp: 18 16  Temp: 98.3 F (36.8 C) 98 F (36.7 C)  SpO2: 91% 93%    Intake/Output Summary (Last 24 hours) at 09/26/2023 1555 Last data  filed at 09/26/2023 0355 Gross per 24 hour  Intake 1622.96 ml  Output --  Net 1622.96 ml   Filed Weights   09/25/23 1236  Weight: 68 kg    Exam:  General:  Appears calm and comfortable and is in NAD Eyes:  EOMI, normal lids, iris ENT:   grossly normal hearing, lips & tongue, mmm Neck:  no LAD, masses or thyromegaly Cardiovascular:  RRR, no m/r/g. No LE edema.  Respiratory:   CTA bilaterally with no wheezes/rales/rhonchi.  Normal respiratory effort. Abdomen:  soft, NT, ND; lidoderm patch in place with point tenderness along L periumbilical region Skin:  no rash or induration seen on limited exam Back:  no deformity, has point TTP along L4 region Musculoskeletal:  grossly normal tone BUE/BLE, good ROM, no bony abnormality Psychiatric:  grossly normal mood and affect, speech fluent and appropriate, AOx3 Neurologic:  CN 2-12 grossly intact, moves all extremities in coordinated fashion  Data Reviewed: I have reviewed the patient's lab results since admission.  Pertinent labs for today include:  Na++ 126 WBC 16.1, improved Hgb 7.7, down from 8.4 Urine culture pending     Family Communication: Granddaughter was present throughout evaluation  Disposition: Status is: Inpatient Remains inpatient appropriate because: ongoing monitoring     Time spent: 50 minutes  Unresulted Labs (From admission, onward)     Start     Ordered   09/27/23 0500  CBC with Differential/Platelet  Tomorrow morning,   R        09/26/23 1555   09/27/23 0500  Basic metabolic panel  Tomorrow morning,   R        09/26/23 1555   09/25/23 1504  Urine Culture  Add-on,   AD       Question:  Indication  Answer:  Bacteriuria screening (OB/GYN or Uro)   09/25/23 1503             Author: Jonah Blue, MD 09/26/2023 3:55 PM  For on call review [email or web address].

## 2023-09-26 NOTE — Evaluation (Signed)
 Occupational Therapy Evaluation Patient Details Name: Kathryn Wise MRN: [id number] DOB: 21-Aug-1929 Today's Date: 09/26/2023   History of Present Illness   Kathryn Wise is a 89 y.o. female who presents with the chief complaint of severe back pain secondary to T12 compression fracture.  She was in her usual state of health until she was recently admitted for an abdominal wall tear after severe coughing bout.  She was discharged home but had a fall during her hospitalization with no evidence of cervical or thoracic spine fracture.  She came back in with intractable back pain in the midline with no radiation down her legs.  No new numbness weakness or tingling.  No new neurologic deficits.  No new changes in her bowel or bladder history.     Clinical Impressions Pt was seen for OT evaluation this date. At the start of session pt resting in bed with granddaughter at bed side. Prior to initial hospital admission, pt was independent in all ADLs. Pt lives alone with steps to enter her bed. Pt presents to acute OT demonstrating impaired ADL performance and functional mobility 2/2 decreased activity tolerance and functional strength/ROM/balance deficits. Pt required MIN A to log roll to EOB, MAX A to return to supine post OOB standing. To donn/doff TLSO on EOB, pt required MAX A. MIN A +2 required for STS transfer with use of RW, vcs for hand placement and technique. Pt took 4 lateral steps up the head of bed with CGA. BP sitting: 122/64, HR 59. BP standing: 108/76, HR 12. Pt reports feeling extremly tired and weak, heavy reliant on RW during all OOB mobility. Will continue to assess ADLs and mobility on next available date. Pt would benefit from skilled OT to address noted impairments and functional limitations (see below for any additional details). OT will follow acutely.      If plan is discharge home, recommend the following:   Help with stairs or ramp for entrance;A lot of help with walking and/or  transfers;A lot of help with bathing/dressing/bathroom;Direct supervision/assist for medications management;Direct supervision/assist for financial management;Assist for transportation     Functional Status Assessment   Patient has had a recent decline in their functional status and demonstrates the ability to make significant improvements in function in a reasonable and predictable amount of time.     Equipment Recommendations   Other (comment) (Next venue of care)     Recommendations for Other Services         Precautions/Restrictions   Precautions Precautions: Fall Required Braces or Orthoses: Spinal Brace Spinal Brace: Thoracolumbosacral orthotic (To be worn when out of bed) Restrictions Weight Bearing Restrictions Per Provider Order: No     Mobility Bed Mobility Overal bed mobility: Needs Assistance Bed Mobility: Rolling, Sidelying to Sit, Sit to Sidelying Rolling: Max assist Sidelying to sit: Min assist (Plus max verbal cues for sequencing)     Sit to sidelying: Max assist General bed mobility comments: cues for log roll to help with pain in abdomen/back and LE management.    Transfers Overall transfer level: Needs assistance Equipment used: Rolling walker (2 wheels) Transfers: Sit to/from Stand Sit to Stand: Min assist, +2 physical assistance           General transfer comment: BP sitting: 122/64, HR 59. BP standing: 108/76, HR 12. Pt reports feeling extremly tired and weak, able to stand for ~4 mins at EOB with RW, reliant on RW for stability.      Balance Overall balance assessment: Needs  assistance Sitting-balance support: Feet supported, Bilateral upper extremity supported Sitting balance-Leahy Scale: Poor Sitting balance - Comments: Reliant on bracing herself on bed to remain upright sitting position   Standing balance support: During functional activity, Bilateral upper extremity supported Standing balance-Leahy Scale: Poor                              ADL either performed or assessed with clinical judgement   ADL Overall ADL's : Needs assistance/impaired                 Upper Body Dressing : Maximal assistance;Sitting Upper Body Dressing Details (indicate cue type and reason): Donning TLSO Lower Body Dressing: Maximal assistance;Bed level Lower Body Dressing Details (indicate cue type and reason): Donning slippers             Functional mobility during ADLs: Contact guard assist;Cueing for sequencing;Rolling walker (2 wheels) General ADL Comments: Pt participation in ADL tasks is limited on this date due to lethargic, unable to keep her eyes open during session dispite multimodal cues     Vision Baseline Vision/History: 1 Wears glasses       Perception         Praxis         Pertinent Vitals/Pain Pain Assessment Pain Assessment: 0-10 Pain Score: 8  Pain Location: Back Pain Descriptors / Indicators: Aching, Sore Pain Intervention(s): Limited activity within patient's tolerance, Monitored during session, Premedicated before session, Repositioned     Extremity/Trunk Assessment Upper Extremity Assessment Upper Extremity Assessment: Generalized weakness   Lower Extremity Assessment Lower Extremity Assessment: Generalized weakness;Defer to PT evaluation       Communication Communication Communication: No apparent difficulties   Cognition Arousal: Lethargic Behavior During Therapy: WFL for tasks assessed/performed Cognition: No apparent impairments                               Following commands: Intact       Cueing  General Comments   Cueing Techniques: Verbal cues;Tactile cues      Exercises Other Exercises Other Exercises: Edu: TLSO donning/doffing (will need reenforcement due to level of lethargy on this date)   Shoulder Instructions      Home Living Family/patient expects to be discharged to:: Inpatient rehab Living Arrangements: Alone Available  Help at Discharge: Family Type of Home: House Home Access: Stairs to enter Entrance Stairs-Number of Steps: 1 Entrance Stairs-Rails: None Home Layout: One level     Bathroom Shower/Tub: Runner, broadcasting/film/video: Grab bars - tub/shower;Shower seat          Prior Functioning/Environment Prior Level of Function : Independent/Modified Independent;History of Falls (last six months);Driving             Mobility Comments: IND with ambulation, no AD. Pt/daughter report one fall in last 6 months ADLs Comments: IND with ADLs and IADls    OT Problem List: Decreased activity tolerance;Impaired balance (sitting and/or standing);Decreased range of motion;Decreased knowledge of use of DME or AE;Decreased strength;Pain   OT Treatment/Interventions: Self-care/ADL training;Therapeutic exercise;Energy conservation;DME and/or AE instruction;Therapeutic activities      OT Goals(Current goals can be found in the care plan section)   Acute Rehab OT Goals Patient Stated Goal: to go to rehab OT Goal Formulation: With patient/family Time For Goal Achievement: 10/10/23 Potential to Achieve Goals: Good ADL Goals Pt Will  Perform Grooming: with modified independence;standing Pt Will Perform Lower Body Dressing: with modified independence;with adaptive equipment;sit to/from stand Pt Will Transfer to Toilet: with modified independence;ambulating Pt Will Perform Toileting - Clothing Manipulation and hygiene: with modified independence;sit to/from stand   OT Frequency:  Min 1X/week    Co-evaluation PT/OT/SLP Co-Evaluation/Treatment: Yes Reason for Co-Treatment: For patient/therapist safety;To address functional/ADL transfers   OT goals addressed during session: ADL's and self-care;Proper use of Adaptive equipment and DME      AM-PAC OT "6 Clicks" Daily Activity     Outcome Measure Help from another person eating meals?: None Help from another person taking care of personal  grooming?: A Lot Help from another person toileting, which includes using toliet, bedpan, or urinal?: A Lot Help from another person bathing (including washing, rinsing, drying)?: A Lot Help from another person to put on and taking off regular upper body clothing?: A Lot Help from another person to put on and taking off regular lower body clothing?: A Lot 6 Click Score: 14   End of Session Equipment Utilized During Treatment: Rolling walker (2 wheels);Oxygen Nurse Communication: Mobility status  Activity Tolerance: Patient tolerated treatment well Patient left: in bed;with call bell/phone within reach;with bed alarm set;with family/visitor present  OT Visit Diagnosis: Other abnormalities of gait and mobility (R26.89);Muscle weakness (generalized) (M62.81);History of falling (Z91.81)                Time: 8295-6213 OT Time Calculation (min): 21 min Charges:  OT General Charges $OT Visit: 1 Visit OT Evaluation $OT Eval Moderate Complexity: 1 Mod  Kaydee Magel M.S. OTR/L  09/26/23, 1:18 PM

## 2023-09-26 NOTE — [place] FL2 (Signed)
 [place] MEDICAID FL2 LEVEL OF CARE FORM     IDENTIFICATION  Patient Name: Kathryn Wise Birthdate: 20-Aug-1929 Sex: female Admission Date (Current Location): 09/25/2023  [place] and [place] Number:  Chiropodist and Address:  [facility], [address] [place], [place] [address]      Provider Number: [id number]  Attending Physician Name and Address:  Jonah Blue, MD  Relative Name and Phone Number:  Hannah Beat  Granddaughter  Emergency Contact  [phone]    Current Level of Care: Hospital Recommended Level of Care: Skilled Nursing Facility Prior Approval Number:    Date Approved/Denied:   PASRR Number: [id number]  Discharge Plan: SNF    Current Diagnoses: Patient Active Problem List   Diagnosis Date Noted   T12 burst fracture (HCC) 09/25/2023   Orthostatic hypotension 09/23/2023   Rectus sheath hematoma, subsequent encounter 09/22/2023   Symptomatic anemia 09/22/2023   Obstructive hypertrophic cardiomyopathy (HCC) 03/02/2023   UTI (urinary tract infection) 03/01/2023   NSTEMI (non-ST elevated myocardial infarction) (HCC) 03/01/2023   Acute heart failure with preserved ejection fraction (HFpEF) (HCC) 03/01/2023   Leukocytosis 03/01/2023   Rigors 03/01/2023   Demand ischemia (HCC) 03/01/2023   Paroxysmal atrial flutter (HCC) 08/18/2022   Near syncope 08/18/2022   Paroxysmal tachycardia (HCC) 08/18/2022   HOCM (hypertrophic obstructive cardiomyopathy) (HCC) 08/18/2022   Labile hypertension 08/18/2022   Chest pain 08/18/2022   Paroxysmal atrial fibrillation with RVR (HCC) 08/17/2022   Subdural hematoma (HCC) 11/14/2021   Iron deficiency anemia, unspecified 10/12/2021   Cerumen impaction 10/10/2021   Prediabetes 10/10/2021   Polyarthralgia 09/26/2021   Decreased glomerular filtration rate (GFR) 04/01/2021   Aortic atherosclerosis (HCC) 03/28/2021   Hyponatremia 10/21/2020   Elevated troponin 10/21/2020   Carotid  stenosis, symptomatic w/o infarct, bilateral 10/19/2020   Carotid stenosis 09/12/2020   Urinary incontinence, urge 05/05/2020   Arteriosclerotic cerebrovascular disease 05/05/2020   Recurrent occipital headache 03/07/2019   Sensorineural hearing loss (SNHL) of left ear 01/23/2019   Renal artery stenosis (HCC) 01/01/2019   Lymphedema 01/01/2019   PAD (peripheral artery disease) (HCC) 01/01/2019   History of TIA (transient ischemic attack) 11/13/2018   Tinnitus aurium, bilateral 10/19/2018   Blurred vision, bilateral 10/19/2018   Insomnia due to psychological stress 10/07/2018   Food intolerance in adult 10/07/2018   Myalgia due to statin 12/10/2017   Hyperlipidemia LDL goal <130 10/03/2017   Hiatal hernia 07/03/2017   CAD (coronary atherosclerotic disease) 07/03/2017   Hospital discharge follow-up 07/03/2017   Chest pain due to CAD (HCC) 06/10/2017   Pain in left wrist 09/20/2016   Breast cancer screening by mammogram 09/20/2016   Precordial pain 05/31/2016   Hypothyroidism 04/28/2015   Renovascular hypertension 01/13/2014   Osteopenia 10/25/2013   Disorder of bone and cartilage 10/25/2013   GERD (gastroesophageal reflux disease) 09/11/2013   Encounter for preventive health examination 09/11/2013   Heart murmur 11/28/2012   History of shingles    Peripheral edema 11/08/2012   Osteoarthritis of left shoulder 07/14/2012    Orientation RESPIRATION BLADDER Height & Weight     Self, Time, Situation, Place  O2 (soft diet) Continent Weight: 68 kg Height:  5\' 5"  (165.1 cm)  BEHAVIORAL SYMPTOMS/MOOD NEUROLOGICAL BOWEL NUTRITION STATUS      Continent Diet  AMBULATORY STATUS COMMUNICATION OF NEEDS Skin   Extensive Assist Verbally Normal                       Personal Care  Assistance Level of Assistance  Bathing, Feeding, Dressing Bathing Assistance: Limited assistance Feeding assistance: Limited assistance Dressing Assistance: Limited assistance     Functional  Limitations Info  Sight, Hearing, Speech Sight Info: Adequate Hearing Info: Adequate Speech Info: Adequate    SPECIAL CARE FACTORS FREQUENCY  PT (By licensed PT), OT (By licensed OT)     PT Frequency: 5 times per week OT Frequency: 5 times per week            Contractures Contractures Info: Not present    Additional Factors Info  Code Status, Allergies Code Status Info: DNR Limited Allergies Info: Alendronate, Codeine, Lisinopril, Metoprolol, Oxycodone, Oxycontin (Oxycodone Hcl)           Current Medications (09/26/2023):  This is the current hospital active medication list Current Facility-Administered Medications  Medication Dose Route Frequency Provider Last Rate Last Admin   acetaminophen (TYLENOL) tablet 650 mg  650 mg Oral Q6H PRN Jonah Blue, MD       Or   acetaminophen (TYLENOL) suppository 650 mg  650 mg Rectal Q6H PRN Jonah Blue, MD       bisacodyl (DULCOLAX) EC tablet 5 mg  5 mg Oral Daily PRN Jonah Blue, MD       cefTRIAXone (ROCEPHIN) 1 g in sodium chloride 0.9 % 100 mL IVPB  1 g Intravenous Q24H Jonah Blue, MD       docusate sodium (COLACE) capsule 100 mg  100 mg Oral BID Jonah Blue, MD   100 mg at 09/26/23 1001   hydrALAZINE (APRESOLINE) injection 5 mg  5 mg Intravenous Q4H PRN Jonah Blue, MD       HYDROcodone-acetaminophen (NORCO/VICODIN) 5-325 MG per tablet 1-2 tablet  1-2 tablet Oral Q4H PRN Jonah Blue, MD   2 tablet at 09/26/23 1005   lactated ringers infusion   Intravenous Continuous Jonah Blue, MD 75 mL/hr at 09/26/23 1010 New Bag at 09/26/23 1010   levothyroxine (SYNTHROID) tablet 75 mcg  75 mcg Oral Noemi Chapel, MD   75 mcg at 09/25/23 2207   lidocaine (LIDODERM) 5 % 1-3 patch  1-3 patch Transdermal Q24H Jonah Blue, MD   1 patch at 09/25/23 1919   midodrine (PROAMATINE) tablet 5 mg  5 mg Oral TID WC Jonah Blue, MD   5 mg at 09/26/23 1001   morphine (PF) 2 MG/ML injection 2 mg  2 mg Intravenous  Q2H PRN Jonah Blue, MD   2 mg at 09/25/23 2203   ondansetron [facility]) tablet 4 mg  4 mg Oral Q6H PRN Jonah Blue, MD       Or   ondansetron [facility]) injection 4 mg  4 mg Intravenous Q6H PRN Jonah Blue, MD       Oral care mouth rinse  15 mL Mouth Rinse PRN Jonah Blue, MD       pantoprazole (PROTONIX) EC tablet 40 mg  40 mg Oral Daily Jonah Blue, MD   40 mg at 09/26/23 1005   polyethylene glycol (MIRALAX / GLYCOLAX) packet 17 g  17 g Oral Daily Jonah Blue, MD   17 g at 09/26/23 1000   rosuvastatin (CRESTOR) tablet 10 mg  10 mg Oral Noemi Chapel, MD   10 mg at 09/25/23 2207   sodium chloride flush (NS) 0.9 % injection 3 mL  3 mL Intravenous Steva Colder, MD   3 mL at 09/26/23 1006   temazepam (RESTORIL) capsule 15 mg  15 mg Oral QHS PRN Jonah Blue, MD  15 mg at 09/25/23 2207     Discharge Medications: Please see discharge summary for a list of discharge medications.  Relevant Imaging Results:  Relevant Lab Results:   Additional Information SS# [id number]  Marlowe Sax, RN

## 2023-09-26 NOTE — Progress Notes (Signed)
 Physical Therapy Evaluation Patient Details Name: Kathryn Wise MRN: [id number] DOB: 11-18-1929 Today's Date: 09/26/2023  History of Present Illness  Kathryn Wise is a 89 y.o. female who presents with the chief complaint of severe back pain secondary to T12 compression fracture.  She was in her usual state of health until she was recently admitted for an abdominal wall tear after severe coughing bout.  She was discharged home but had a fall during her hospitalization. She came back in with intractable back pain in the midline with no radiation down her legs.    Clinical Impression  Orders Received. Patient supine in bed upon arrival, family member at bedside. Patient agreeable to PT/OT Co-Evaluation. Patient was MOD I prior to last admission without use of AD. Patient lives alone. Patient was still driving.   Patient require Min to Max A for bed mobility this date, cues for log roll to help with pain management. TLSO donned for OOB mobility, educated patient on bracing. BP stable seated, 122/64. Therefore attempt to stand from EOB with RW, MIN A +2. Limited standing tolerance d/t pain levels. BP stable in standing: 108/76. Patient refused additional transfers or gait trial due to pain. Anticipate patient ability to ambulate with assist once pain is under control. Patient left supine in bed, family remains at bedside, and all needs in reach. Patient will benefit from skilled acute PT services to address functional impairments (see below for additional) and maximize functional mobility. Anticipate the need for follow up PT services upon acute hospital discharge. Will continue to follow acutely.        If plan is discharge home, recommend the following: A lot of help with walking and/or transfers;A lot of help with bathing/dressing/bathroom;Assist for transportation;Help with stairs or ramp for entrance   Can travel by private vehicle   Yes    Equipment Recommendations Other (comment) (TBD at  next level of care)  Recommendations for Other Services       Functional Status Assessment Patient has had a recent decline in their functional status and demonstrates the ability to make significant improvements in function in a reasonable and predictable amount of time.     Precautions / Restrictions Precautions Precautions: Fall Precaution/Restrictions Comments: Monitor BP; No Spinal Precautions noted in chart, but educated on precautions to help with pain managment Required Braces or Orthoses: Spinal Brace Spinal Brace: Thoracolumbosacral orthotic (when out of bed) Restrictions Weight Bearing Restrictions Per Provider Order: No      Mobility  Bed Mobility Overal bed mobility: Needs Assistance Bed Mobility: Rolling, Sidelying to Sit, Sit to Sidelying Rolling: Max assist Sidelying to sit: Min assist (cues for sequencing)     Sit to sidelying: Max assist General bed mobility comments: VC's for log roll to help with pain management    Transfers Overall transfer level: Needs assistance Equipment used: Rolling walker (2 wheels) Transfers: Sit to/from Stand Sit to Stand: Min assist, +2 physical assistance           General transfer comment: Pt able to stand from EOB with MIN A +2, cues for hand placement. Patient w/ increased pain with mobility attempts limiting standing tolerance. BP stable throughout mobility (see section below for details). PT encouraged patient to attempt to ambulate/transfer, patient refused d/t pain.    Ambulation/Gait               General Gait Details: deferred; patient refused due to pain levels this date. Anticipate patient will be able to ambulate when  pain is better controlled. BP stable this date for OOB mobility.  Stairs            Wheelchair Mobility     Tilt Bed    Modified Rankin (Stroke Patients Only)       Balance Overall balance assessment: Needs assistance Sitting-balance support: Feet supported, Bilateral upper  extremity supported Sitting balance-Leahy Scale: Fair Sitting balance - Comments: Require UE support/bracing to stabilize during seated position   Standing balance support: During functional activity, Bilateral upper extremity supported Standing balance-Leahy Scale: Fair Standing balance comment: increased reliance on UE support; no overt LOB.                             Pertinent Vitals/Pain Pain Assessment Pain Assessment: 0-10 Pain Score: 8  Pain Location: Back Pain Descriptors / Indicators: Aching, Sore Pain Intervention(s): Limited activity within patient's tolerance, Monitored during session    Home Living Family/patient expects to be discharged to:: Private residence Living Arrangements: Alone Available Help at Discharge: Family Type of Home: House Home Access: Stairs to enter Entrance Stairs-Rails: None Entrance Stairs-Number of Steps: 1   Home Layout: One level Home Equipment: Grab bars - tub/shower;Shower seat      Prior Function Prior Level of Function : Independent/Modified Independent;History of Falls (last six months);Driving             Mobility Comments: Prior to recent hospitalization, patient was IND with ambulation, no use of AD. During last admission, PT/OT recommend use of RW for safety. ADLs Comments: IND with ADLs and IADls     Extremity/Trunk Assessment   Upper Extremity Assessment Upper Extremity Assessment: Generalized weakness    Lower Extremity Assessment Lower Extremity Assessment: Generalized weakness       Communication   Communication Communication: No apparent difficulties    Cognition Arousal: Lethargic Behavior During Therapy: WFL for tasks assessed/performed                             Following commands: Intact       Cueing Cueing Techniques: Verbal cues, Tactile cues     General Comments General comments (skin integrity, edema, etc.): BP stable throughout session despite pt reports some  lightheadedness. Seated BP: 122/64, HR: 59. Standing BP: 108/76, 122.    Exercises Other Exercises Other Exercises: Edu: TLSO donning/doffing. Granddaughter present and educated as well.   Assessment/Plan    PT Assessment Patient needs continued PT services  PT Problem List Decreased activity tolerance;Decreased balance;Decreased mobility;Pain;Decreased strength;Decreased knowledge of use of DME       PT Treatment Interventions DME instruction;Gait training;Stair training;Functional mobility training;Therapeutic exercise;Therapeutic activities;Balance training;Neuromuscular re-education;Patient/family education    PT Goals (Current goals can be found in the Care Plan section)  Acute Rehab PT Goals Patient Stated Goal: Improve Back Pain PT Goal Formulation: With patient Time For Goal Achievement: 10/10/23 Potential to Achieve Goals: Good    Frequency Min 1X/week     Co-evaluation PT/OT/SLP Co-Evaluation/Treatment: Yes Reason for Co-Treatment: For patient/therapist safety;To address functional/ADL transfers PT goals addressed during session: Mobility/safety with mobility OT goals addressed during session: ADL's and self-care;Proper use of Adaptive equipment and DME       AM-PAC PT "6 Clicks" Mobility  Outcome Measure Help needed turning from your back to your side while in a flat bed without using bedrails?: A Lot Help needed moving from lying on your back to sitting on  the side of a flat bed without using bedrails?: A Lot Help needed moving to and from a bed to a chair (including a wheelchair)?: A Little Help needed standing up from a chair using your arms (e.g., wheelchair or bedside chair)?: A Little Help needed to walk in hospital room?: A Lot Help needed climbing 3-5 steps with a railing? : A Lot 6 Click Score: 14    End of Session Equipment Utilized During Treatment: Gait belt;Back brace Activity Tolerance: Patient limited by pain Patient left: in bed;with bed alarm  set;with call bell/phone within reach;with family/visitor present Nurse Communication: Mobility status PT Visit Diagnosis: Unsteadiness on feet (R26.81);Other abnormalities of gait and mobility (R26.89);Muscle weakness (generalized) (M62.81);Difficulty in walking, not elsewhere classified (R26.2)    Time: 1610-9604 PT Time Calculation (min) (ACUTE ONLY): 22 min   Charges:   PT Evaluation $PT Eval Moderate Complexity: 1 Mod   PT General Charges $$ ACUTE PT VISIT: 1 Visit        Creed Copper Fairly, PT, DPT 09/26/23 1:06 PM

## 2023-09-26 NOTE — Hospital Course (Signed)
 89yo with h/o collagen vascular disease, HTN, and hypothyroidism who presented on 2/26 with back pain. She was last hospitalized from 2/23-25 with a rectus sheath hematoma which appears to be stable.  T12 burst fracture, neurosurgery consulted, TLSO placed.  Likely to need SNF rehab.

## 2023-09-27 ENCOUNTER — Inpatient Hospital Stay: Payer: Medicare Other

## 2023-09-27 DIAGNOSIS — S22081A Stable burst fracture of T11-T12 vertebra, initial encounter for closed fracture: Secondary | ICD-10-CM | POA: Diagnosis not present

## 2023-09-27 LAB — CBC WITH DIFFERENTIAL/PLATELET
Abs Immature Granulocytes: 0.06 10*3/uL (ref 0.00–0.07)
Basophils Absolute: 0 10*3/uL (ref 0.0–0.1)
Basophils Relative: 0 %
Eosinophils Absolute: 0.1 10*3/uL (ref 0.0–0.5)
Eosinophils Relative: 1 %
HCT: 23.1 % — ABNORMAL LOW (ref 36.0–46.0)
Hemoglobin: 7.4 g/dL — ABNORMAL LOW (ref 12.0–15.0)
Immature Granulocytes: 1 %
Lymphocytes Relative: 25 %
Lymphs Abs: 3.2 10*3/uL (ref 0.7–4.0)
MCH: 27.1 pg (ref 26.0–34.0)
MCHC: 32 g/dL (ref 30.0–36.0)
MCV: 84.6 fL (ref 80.0–100.0)
Monocytes Absolute: 1.2 10*3/uL — ABNORMAL HIGH (ref 0.1–1.0)
Monocytes Relative: 9 %
Neutro Abs: 8.4 10*3/uL — ABNORMAL HIGH (ref 1.7–7.7)
Neutrophils Relative %: 64 %
Platelets: 170 10*3/uL (ref 150–400)
RBC: 2.73 MIL/uL — ABNORMAL LOW (ref 3.87–5.11)
RDW: 16.8 % — ABNORMAL HIGH (ref 11.5–15.5)
WBC: 12.9 10*3/uL — ABNORMAL HIGH (ref 4.0–10.5)
nRBC: 0 % (ref 0.0–0.2)

## 2023-09-27 LAB — BASIC METABOLIC PANEL
Anion gap: 6 (ref 5–15)
BUN: 12 mg/dL (ref 8–23)
CO2: 22 mmol/L (ref 22–32)
Calcium: 7.6 mg/dL — ABNORMAL LOW (ref 8.9–10.3)
Chloride: 97 mmol/L — ABNORMAL LOW (ref 98–111)
Creatinine, Ser: 0.74 mg/dL (ref 0.44–1.00)
GFR, Estimated: >60 mL/min (ref >60)
Glucose, Bld: 91 mg/dL (ref 70–99)
Potassium: 4 mmol/L (ref 3.5–5.1)
Sodium: 125 mmol/L — ABNORMAL LOW (ref 135–145)

## 2023-09-27 LAB — URINE CULTURE: Culture: 40000 — AB

## 2023-09-27 MED ORDER — CEPHALEXIN 500 MG PO CAPS
500.0000 mg | ORAL_CAPSULE | Freq: Three times a day (TID) | ORAL | Status: AC
  Administered 2023-09-27 – 2023-09-30 (×9): 500 mg via ORAL
  Filled 2023-09-27 (×10): qty 1

## 2023-09-27 MED ORDER — BISACODYL 5 MG PO TBEC
5.0000 mg | DELAYED_RELEASE_TABLET | Freq: Once | ORAL | Status: AC
Start: 2023-09-27 — End: 2023-09-27

## 2023-09-27 MED ORDER — SODIUM CHLORIDE 1 G PO TABS
1.0000 g | ORAL_TABLET | Freq: Three times a day (TID) | ORAL | Status: AC
  Administered 2023-09-27 – 2023-09-30 (×9): 1 g via ORAL
  Filled 2023-09-27 (×9): qty 1

## 2023-09-27 NOTE — TOC Progression Note (Signed)
 Transition of Care [facility]) - Progression Note    Patient Details  Name: Kathryn Wise MRN: [id number] Date of Birth: [birth year]  Transition of Care [facility]) CM/SW Contact  Hetty Ely, RN Phone Number: 09/27/2023, 9:46 AM  Clinical Narrative:  Sherron Monday with Granddaughter Lajoyce Corners about bed offers, she consulted with Patient and Aunt who is at bedside and they chose [facility]. Text Darrian and confirmed bed offer.    Expected Discharge Plan: Skilled Nursing Facility Barriers to Discharge: SNF Pending bed offer  Expected Discharge Plan and Services   Discharge Planning Services: CM Consult   Living arrangements for the past 2 months: Single Family Home                                       Social Determinants of Health (SDOH) Interventions SDOH Screenings   Food Insecurity: No Food Insecurity (09/25/2023)  Housing: Low Risk  (09/25/2023)  Transportation Needs: No Transportation Needs (09/25/2023)  Utilities: Not At Risk (09/25/2023)  Depression (PHQ2-9): Low Risk  (10/10/2021)  Recent Concern: Depression (PHQ2-9) - Medium Risk (09/26/2021)  Financial Resource Strain: Low Risk  (09/12/2023)   Received from [facility]  Physical Activity: Sufficiently Active (04/20/2020)  Social Connections: Moderately Isolated (09/25/2023)  Stress: No Stress Concern Present (04/20/2020)  Tobacco Use: Medium Risk (09/25/2023)    Readmission Risk Interventions     No data to display

## 2023-09-27 NOTE — Progress Notes (Addendum)
 Progress Note   Patient: Kathryn Wise ZOX:[id number] DOB: 13-Apr-1930 DOA: 09/25/2023     2 DOS: the patient was seen and examined on 09/27/2023   Brief hospital course: 89yo with h/o collagen vascular disease, HTN, and hypothyroidism who presented on 2/26 with back pain. She was last hospitalized from 2/23-25 with a rectus sheath hematoma which appears to be stable.  T12 burst fracture, neurosurgery consulted, TLSO placed.  Likely to need SNF rehab.  Assessment and Plan:  T12 burst fracture 40% height loss Mechanical fall resulting in thoracic compression fracture, new from prior imaging 3 days ago Neurosurgery consulted, likely conservative management MRI thoracic and lumbar spines ordered and show similar findings as well as R > L facet arthrosis at L5-S1 with reactive marrow edema TLSO while upright Consider IR consult for kyphoplasty but patient prefers non-interventional approach Pain control with morphine, Norco, and lidocaine patch; add methocarbamol PT/OT consults She appears likely to need SNF rehab F/u with neurosurgery in 2-4 weeks She is becoming more mobile with decreased pain and is approaching stability for SNF rehab    UTI H/o recurrent UTI, had foley during last hospitalization, and now with hesitation and dysuria UA suggestive of UTI Culture with E coli (40k colonies) resistant to Ampicillin/Unasyn Ceftriaxone daily x 2 -> Cefazolin for 3 more days   Hyponatremia Mild hyponatremia during last hospitalization Poor PO intake, likely hypovolemic IVF with minimal improvement Will add salt tabs TID x 3 days  Constipation Patient feeling full, bloated, and now with n/v Reports no recent BM despite Colace, Miralax Refused enema Added Dulcolax PO Tap water enema also ordered, as she is now willing    Rectus sheath hematoma with ABLA Suspected to be from intractable coughing due to bronchitis prior to last admission Stable from prior IR consulted previously -  opinion is the source of bleeding comes from small caliber muscular branch artery, challenging to reach and perform embolization IR and general surgeon recommended conservative management, pain control, series of H&H and transfuse for hemodynamic instability Hgb is slightly lower than prior but still >7, no indication for transfusion Abdominal binder Lidocaine patch Continue to hold ASA Recheck CBC in AM   Hypotension with h/o HTN Marginal BPs on presentation Orthostatic hypotension during last admission led to holding HTN medications and starting midodrine Continue midodrine, hold BP meds till sbp >120 Left renal artery stenosis status post stenting in June 2020 History of atrial tachycardia Takes bisoprolol and as needed amlodipine Continue to hold bisoprolol for now BP range x 24 hours 116/53-137/53, no longer with documented hypotension   Hypothyroidism Continue Synthroid   HLD Continue rosuvastatin   Dysphagia Chronic Soft diet ordered   DNR Confirmed on admission  Deconditioning Patient fell during last hospitalization and was very weak on presentation this time PT/OT recommending SNF rehab and patient/family are in agreement Offered a bed at [facility] Patient needs to have BM, recheck CBC to ensure stability, and needs ongoing avoidance of hypotension; possible dc to [facility] tomorrow if so      Consultants: Neurosurgery PT OT [facility] team   Procedures: None   Antibiotics: Ceftriaxone 2/26-27 Cefazolin 2/28-3/2    30 Day Unplanned Readmission Risk Score    Flowsheet Row ED to Hosp-Admission (Current) from 09/25/2023 in [facility] (1A)  30 Day Unplanned Readmission Risk Score (%) 20.08 Filed at 09/27/2023 0400       This score is the patient's risk of an unplanned readmission within 30 days of being  discharged (0 -100%). The score is based on dignosis, age, lab data, medications, orders, and past utilization.   Low:   0-14.9   Medium: 15-21.9   High: 22-29.9   Extreme: 30 and above           Subjective: Feeling bloated today, belching, feels constipated.  Otherwise, back pain is improved and so is mobility.   Objective: Vitals:   09/26/23 2235 09/27/23 1052  BP: (!) 122/56 (!) 137/53  Pulse: 97 95  Resp: 18 18  Temp: 97.9 F (36.6 C) 97.8 F (36.6 C)  SpO2: 90% 91%    Intake/Output Summary (Last 24 hours) at 09/27/2023 1326 Last data filed at 09/27/2023 1159 Gross per 24 hour  Intake 1000 ml  Output --  Net 1000 ml   Filed Weights   09/25/23 1236  Weight: 68 kg    Exam:  General:  Appears calm and comfortable and is in NAD Eyes:  EOMI, normal lids, iris ENT:  grossly normal hearing, lips & tongue, mmm Neck:  no LAD, masses or thyromegaly Cardiovascular:  RRR, no m/r/g. No LE edema.  Respiratory:   CTA bilaterally with no wheezes/rales/rhonchi.  Normal respiratory effort. Abdomen:  soft, NT, ND Skin:  no rash or induration seen on limited exam Musculoskeletal:  grossly normal tone BUE/BLE, good ROM, no bony abnormality Psychiatric:  grossly normal mood and affect, speech fluent and appropriate, AOx3 Neurologic:  CN 2-12 grossly intact, moves all extremities in coordinated fashion  Data Reviewed: I have reviewed the patient's lab results since admission.  Pertinent labs for today include:   Na++ 125 WBC 12.9, improving Hgb 7.4, down from 9.2 on 2/24 Urine culture with 40k colonies GNR     Family Communication: Daughter was present throughout evaluation  Disposition: Status is: Inpatient Remains inpatient appropriate because: ongoing management     Time spent: 50 minutes  Unresulted Labs (From admission, onward)     Start     Ordered   09/28/23 0500  CBC with Differential/Platelet  Tomorrow morning,   R        09/27/23 1326   09/28/23 0500  Basic metabolic panel  Tomorrow morning,   R        09/27/23 1326             Author: Jonah Blue,  MD 09/27/2023 1:26 PM  For on call review [email or web address].

## 2023-09-27 NOTE — Progress Notes (Signed)
 PT Cancellation Note  Patient Details Name: Kathryn Wise MRN: [id number] DOB: 1930-02-04   Cancelled Treatment:     PT attempt. Pt politely refused. "I just don't feel every good. I'm nauseous and just have felt bad all day. I need to have a BM but can't "     [name] 09/27/2023, 3:40 PM

## 2023-09-28 DIAGNOSIS — S22081A Stable burst fracture of T11-T12 vertebra, initial encounter for closed fracture: Secondary | ICD-10-CM | POA: Diagnosis not present

## 2023-09-28 LAB — BASIC METABOLIC PANEL
Anion gap: 4 — ABNORMAL LOW (ref 5–15)
BUN: 11 mg/dL (ref 8–23)
CO2: 25 mmol/L (ref 22–32)
Calcium: 8 mg/dL — ABNORMAL LOW (ref 8.9–10.3)
Chloride: 100 mmol/L (ref 98–111)
Creatinine, Ser: 0.7 mg/dL (ref 0.44–1.00)
GFR, Estimated: >60 mL/min (ref >60)
Glucose, Bld: 101 mg/dL — ABNORMAL HIGH (ref 70–99)
Potassium: 4.4 mmol/L (ref 3.5–5.1)
Sodium: 129 mmol/L — ABNORMAL LOW (ref 135–145)

## 2023-09-28 LAB — CBC WITH DIFFERENTIAL/PLATELET
Abs Immature Granulocytes: 0.05 10*3/uL (ref 0.00–0.07)
Basophils Absolute: 0 10*3/uL (ref 0.0–0.1)
Basophils Relative: 0 %
Eosinophils Absolute: 0.1 10*3/uL (ref 0.0–0.5)
Eosinophils Relative: 1 %
HCT: 24.9 % — ABNORMAL LOW (ref 36.0–46.0)
Hemoglobin: 7.9 g/dL — ABNORMAL LOW (ref 12.0–15.0)
Immature Granulocytes: 0 %
Lymphocytes Relative: 25 %
Lymphs Abs: 3 10*3/uL (ref 0.7–4.0)
MCH: 27 pg (ref 26.0–34.0)
MCHC: 31.7 g/dL (ref 30.0–36.0)
MCV: 85 fL (ref 80.0–100.0)
Monocytes Absolute: 1.1 10*3/uL — ABNORMAL HIGH (ref 0.1–1.0)
Monocytes Relative: 9 %
Neutro Abs: 7.9 10*3/uL — ABNORMAL HIGH (ref 1.7–7.7)
Neutrophils Relative %: 65 %
Platelets: 169 10*3/uL (ref 150–400)
RBC: 2.93 MIL/uL — ABNORMAL LOW (ref 3.87–5.11)
RDW: 17 % — ABNORMAL HIGH (ref 11.5–15.5)
WBC: 12.2 10*3/uL — ABNORMAL HIGH (ref 4.0–10.5)
nRBC: 0 % (ref 0.0–0.2)

## 2023-09-28 MED ORDER — MORPHINE SULFATE (PF) 2 MG/ML IV SOLN
2.0000 mg | INTRAVENOUS | Status: DC | PRN
  Administered 2023-09-30 (×2): 2 mg via INTRAVENOUS
  Filled 2023-09-28 (×2): qty 1

## 2023-09-28 NOTE — Plan of Care (Signed)

## 2023-09-28 NOTE — Progress Notes (Signed)
 Progress Note   Patient: Kathryn Wise [id number] DOB: Oct 19, 1929 DOA: 09/25/2023     3 DOS: the patient was seen and examined on 09/28/2023   Brief hospital course: 89yo with h/o collagen vascular disease, HTN, and hypothyroidism who presented on 2/26 with back pain. She was last hospitalized from 2/23-25 with a rectus sheath hematoma which appears to be stable.  T12 burst fracture, neurosurgery consulted, TLSO placed.  Likely to need SNF rehab.  Assessment and Plan:  T12 burst fracture 40% height loss Mechanical fall resulting in thoracic compression fracture, new from prior imaging 3 days prior Neurosurgery consulted, conservative management MRI thoracic and lumbar spines ordered and show similar findings as well as R > L facet arthrosis at L5-S1 with reactive marrow edema TLSO while upright Pain control with morphine, Norco, and lidocaine patch; add methocarbamol PT/OT consults F/u with neurosurgery in 2-4 weeks She is becoming more mobile with decreased pain and is medically stable for SNF rehab    UTI H/o recurrent UTI, had foley during last hospitalization, and now with hesitation and dysuria UA suggestive of UTI Culture with E coli (40k colonies) resistant to Ampicillin/Unasyn Ceftriaxone daily x 2 -> Cefazolin for 3 more days   Hyponatremia Mild hyponatremia during last hospitalization Poor PO intake IVF with minimal improvement Added salt tabs TID x 3 days, improved   Constipation Patient feeling full, bloated, and now with n/v Reports no recent BM despite Colace, Miralax Added Dulcolax PO Tap water enema also ordered x 2 without relief However, her nausea and discomfort have resolved   Rectus sheath hematoma with ABLA Suspected to be from intractable coughing due to bronchitis prior to last admission Stable from prior IR consulted previously - opinion is the source of bleeding comes from small caliber muscular branch artery, challenging to reach and perform  embolization IR and general surgeon recommended conservative management, pain control, series of H&H and transfuse for hemodynamic instability Hgb is slightly lower than prior but still >7, no indication for transfusion Abdominal binder Lidocaine patch Continue to hold ASA Stable CBC   HTN Left renal artery stenosis status post stenting in June 2020 History of atrial tachycardia Marginal BPs on presentation Orthostatic hypotension during last admission led to holding HTN medications and starting midodrine Continued midodrine on presentation and BP is normalizing Took bisoprolol and as needed amlodipine prior to last hospitalization Continue to hold bisoprolol for now BP range x 24 hours 137/53-148/57, no longer with documented hypotension Stop midodrine and continue to monitor   Hypothyroidism Continue Synthroid   HLD Continue rosuvastatin   Dysphagia Chronic Soft diet ordered   DNR Confirmed on admission   Deconditioning Patient fell during last hospitalization and was very weak on presentation this time PT/OT recommending SNF rehab and patient/family are in agreement Offered a bed at [facility], plan for dc 3/3        Consultants: Neurosurgery PT OT [facility] team   Procedures: None   Antibiotics: Ceftriaxone 2/26-27 Cefazolin 2/28-3/2    30 Day Unplanned Readmission Risk Score    Flowsheet Row ED to Hosp-Admission (Current) from 09/25/2023 in [facility] (1A)  30 Day Unplanned Readmission Risk Score (%) 20.67 Filed at 09/28/2023 1200       This score is the patient's risk of an unplanned readmission within 30 days of being discharged (0 -100%). The score is based on dignosis, age, lab data, medications, orders, and past utilization.   Low:  0-14.9   Medium: 15-21.9  High: 22-29.9   Extreme: 30 and above           Subjective: Feeling better - no longer nauseated although she still has not had a BM.  Ambulated to the  door and back yesterday, hoping for more today.   Objective: Vitals:   09/28/23 0133 09/28/23 0836  BP: (!) 145/88 121/67  Pulse: 82 70  Resp: 18 20  Temp: 97.8 F (36.6 C) 98.1 F (36.7 C)  SpO2: 98% 93%    Intake/Output Summary (Last 24 hours) at 09/28/2023 1235 Last data filed at 09/28/2023 0600 Gross per 24 hour  Intake 1341.14 ml  Output --  Net 1341.14 ml   Filed Weights   09/25/23 1236  Weight: 68 kg    Exam:  General:  Appears calm and comfortable and is in NAD Eyes:  EOMI, normal lids, iris ENT:  grossly normal hearing, lips & tongue, mmm Neck:  no LAD, masses or thyromegaly Cardiovascular:  RRR, no m/r/g. No LE edema.  Respiratory:   CTA bilaterally with no wheezes/rales/rhonchi.  Normal respiratory effort. Abdomen:  soft, NT, ND Skin:  no rash or induration seen on limited exam Musculoskeletal:  grossly normal tone BUE/BLE, good ROM, no bony abnormality Psychiatric:  grossly normal mood and affect, speech fluent and appropriate, AOx3 Neurologic:  CN 2-12 grossly intact, moves all extremities in coordinated fashion  Data Reviewed: I have reviewed the patient's lab results since admission.  Pertinent labs for today include:   WBC 12.2 Hgb 7.9, stable     Family Communication: Daughter was present throughout  Disposition: Status is: Inpatient Remains inpatient appropriate because: placement 3/3, ongoing management     Time spent: 50 minutes  Unresulted Labs (From admission, onward)    None        Author: Jonah Blue, MD 09/28/2023 12:35 PM  For on call review [email or web address].

## 2023-09-28 NOTE — Plan of Care (Signed)
   Problem: Education: Goal: Knowledge of General Education information will improve Description: Including pain rating scale, medication(s)/side effects and non-pharmacologic comfort measures Outcome: Progressing   Problem: Clinical Measurements: Goal: Ability to maintain clinical measurements within normal limits will improve Outcome: Progressing Goal: Will remain free from infection Outcome: Progressing

## 2023-09-28 NOTE — Care Management Important Message (Signed)
 Important Message  Patient Details  Name: Kathryn Wise MRN: [id number] Date of Birth: 05-Jan-1930   Important Message Given:  Yes - Medicare IM     Cristela Blue, CMA 09/28/2023, 9:37 AM

## 2023-09-29 DIAGNOSIS — S22081A Stable burst fracture of T11-T12 vertebra, initial encounter for closed fracture: Secondary | ICD-10-CM | POA: Diagnosis not present

## 2023-09-29 LAB — CBC WITH DIFFERENTIAL/PLATELET
Abs Immature Granulocytes: 0.07 10*3/uL (ref 0.00–0.07)
Basophils Absolute: 0.1 10*3/uL (ref 0.0–0.1)
Basophils Relative: 1 %
Eosinophils Absolute: 0.1 10*3/uL (ref 0.0–0.5)
Eosinophils Relative: 1 %
HCT: 24 % — ABNORMAL LOW (ref 36.0–46.0)
Hemoglobin: 7.5 g/dL — ABNORMAL LOW (ref 12.0–15.0)
Immature Granulocytes: 1 %
Lymphocytes Relative: 27 %
Lymphs Abs: 2.9 10*3/uL (ref 0.7–4.0)
MCH: 27.2 pg (ref 26.0–34.0)
MCHC: 31.3 g/dL (ref 30.0–36.0)
MCV: 87 fL (ref 80.0–100.0)
Monocytes Absolute: 1 10*3/uL (ref 0.1–1.0)
Monocytes Relative: 9 %
Neutro Abs: 6.7 10*3/uL (ref 1.7–7.7)
Neutrophils Relative %: 61 %
Platelets: 180 10*3/uL (ref 150–400)
RBC: 2.76 MIL/uL — ABNORMAL LOW (ref 3.87–5.11)
RDW: 17.1 % — ABNORMAL HIGH (ref 11.5–15.5)
WBC: 10.8 10*3/uL — ABNORMAL HIGH (ref 4.0–10.5)
nRBC: 0 % (ref 0.0–0.2)

## 2023-09-29 LAB — BASIC METABOLIC PANEL
Anion gap: 4 — ABNORMAL LOW (ref 5–15)
BUN: 10 mg/dL (ref 8–23)
CO2: 27 mmol/L (ref 22–32)
Calcium: 8 mg/dL — ABNORMAL LOW (ref 8.9–10.3)
Chloride: 100 mmol/L (ref 98–111)
Creatinine, Ser: 0.88 mg/dL (ref 0.44–1.00)
GFR, Estimated: >60 mL/min (ref >60)
Glucose, Bld: 89 mg/dL (ref 70–99)
Potassium: 4.2 mmol/L (ref 3.5–5.1)
Sodium: 131 mmol/L — ABNORMAL LOW (ref 135–145)

## 2023-09-29 MED ORDER — LACTULOSE 10 GM/15ML PO SOLN
30.0000 g | Freq: Two times a day (BID) | ORAL | Status: DC | PRN
  Administered 2023-09-29: 30 g via ORAL
  Filled 2023-09-29: qty 60

## 2023-09-29 NOTE — Plan of Care (Signed)

## 2023-09-29 NOTE — Progress Notes (Signed)
 Progress Note   Patient: Kathryn Wise [id number]:[id number] DOB: [birth year] DOA: 09/25/2023     4 DOS: the patient was seen and examined on 09/29/2023   Brief hospital course: 89yo with h/o collagen vascular disease, HTN, and hypothyroidism who presented on 2/26 with back pain. She was last hospitalized from 2/23-25 with a rectus sheath hematoma which appears to be stable.  T12 burst fracture, neurosurgery consulted, TLSO placed.  Likely to need SNF rehab.  Assessment and Plan:  T12 burst fracture 40% height loss Mechanical fall resulting in thoracic compression fracture, new from prior imaging 3 days prior Neurosurgery consulted, conservative management MRI thoracic and lumbar spines ordered and show similar findings as well as R > L facet arthrosis at L5-S1 with reactive marrow edema TLSO while upright Pain control with morphine, Norco, and lidocaine patch; add methocarbamol PT/OT consults F/u with neurosurgery in 2-4 weeks She is becoming more mobile with decreased pain and is medically stable for SNF rehab    UTI H/o recurrent UTI, had foley during last hospitalization, and now with hesitation and dysuria UA suggestive of UTI Culture with E coli (40k colonies) resistant to Ampicillin/Unasyn Ceftriaxone daily x 2 -> Cefazolin for 3 more days   Hyponatremia Mild hyponatremia during last hospitalization Poor PO intake IVF with minimal improvement Added salt tabs TID x 3 days, improved   Constipation Patient feeling full, bloated, and now with n/v Reports no recent BM despite Colace, Miralax Added Dulcolax PO Tap water enema also ordered x 2 without relief However, her nausea and discomfort have resolved   Rectus sheath hematoma with ABLA Suspected to be from intractable coughing due to bronchitis prior to last admission Stable from prior IR consulted previously - opinion is the source of bleeding comes from small caliber muscular branch artery, challenging to reach and perform  embolization IR and general surgeon recommended conservative management, pain control, series of H&H and transfuse for hemodynamic instability Hgb is slightly lower than prior but still >7, no indication for transfusion Abdominal binder Lidocaine patch Continue to hold ASA Stable CBC   HTN Left renal artery stenosis status post stenting in June 2020 History of atrial tachycardia Marginal BPs on presentation Orthostatic hypotension during last admission led to holding HTN medications and starting midodrine Continued midodrine on presentation and BP is normalizing Took bisoprolol and as needed amlodipine prior to last hospitalization Continue to hold bisoprolol for now BP range x 24 hours 122/53-153/94, no longer with documented hypotension Stopped midodrine  May need to resume bisoprolol   Hypothyroidism Continue Synthroid   HLD Continue rosuvastatin   Dysphagia Chronic Soft diet ordered but patient requests to liberalize, changed to regular   DNR Confirmed on admission   Deconditioning Patient fell during last hospitalization and was very weak on presentation this time PT/OT recommending SNF rehab and patient/family are in agreement Offered a bed at [facility], plan for dc 3/3         Consultants: Neurosurgery PT OT [facility] team   Procedures: None   Antibiotics: Ceftriaxone 2/26-27 Cefazolin 2/28-3/2    30 Day Unplanned Readmission Risk Score    Flowsheet Row ED to Hosp-Admission (Current) from 09/25/2023 in [facility] (1A)  30 Day Unplanned Readmission Risk Score (%) 20.45 Filed at 09/29/2023 0401       This score is the patient's risk of an unplanned readmission within 30 days of being discharged (0 -100%). The score is based on dignosis, age, lab data, medications, orders, and past  utilization.   Low:  0-14.9   Medium: 15-21.9   High: 22-29.9   Extreme: 30 and above           Subjective: Continues to improve.   Still not having BM but otherwise is getting stronger.   Objective: Vitals:   09/29/23 0945 09/29/23 1226  BP: (!) 122/53 (!) 153/94  Pulse: 95 86  Resp: 16 18  Temp: 97.8 F (36.6 C) 98.5 F (36.9 C)  SpO2: 91% 100%    Intake/Output Summary (Last 24 hours) at 09/29/2023 1316 Last data filed at 09/29/2023 0900 Gross per 24 hour  Intake 320 ml  Output 3 ml  Net 317 ml   Filed Weights   09/25/23 1236  Weight: 68 kg    Exam:  General:  Appears calm and comfortable and is in NAD Eyes:  EOMI, normal lids, iris ENT:  grossly normal hearing, lips & tongue, mmm Neck:  no LAD, masses or thyromegaly Cardiovascular:  RRR, no m/r/g. No LE edema.  Respiratory:   CTA bilaterally with no wheezes/rales/rhonchi.  Normal respiratory effort. Abdomen:  soft, NT, ND Skin:  no rash or induration seen on limited exam Musculoskeletal:  grossly normal tone BUE/BLE, good ROM, no bony abnormality Psychiatric:  grossly normal mood and affect, speech fluent and appropriate, AOx3 Neurologic:  CN 2-12 grossly intact, moves all extremities in coordinated fashion  Data Reviewed: I have reviewed the patient's lab results since admission.  Pertinent labs for today include:   Na++ 131, improved WBC 10.8, improved Hgb 7.5, stable     Family Communication: Daughter was present throughout  Disposition: Status is: Inpatient Remains inpatient appropriate because: awaiting placement     Time spent: 35 minutes  Unresulted Labs (From admission, onward)     Start     Ordered   09/30/23 0500  CBC with Differential/Platelet  Tomorrow morning,   R        09/29/23 0754   09/30/23 0500  Basic metabolic panel  Tomorrow morning,   R        09/29/23 0754             Author: Jonah Blue, MD 09/29/2023 1:16 PM  For on call review [email or web address].

## 2023-09-30 DIAGNOSIS — Z79890 Hormone replacement therapy (postmenopausal): Secondary | ICD-10-CM | POA: Diagnosis not present

## 2023-09-30 DIAGNOSIS — S301XXA Contusion of abdominal wall, initial encounter: Secondary | ICD-10-CM | POA: Diagnosis not present

## 2023-09-30 DIAGNOSIS — Z66 Do not resuscitate: Secondary | ICD-10-CM | POA: Diagnosis not present

## 2023-09-30 DIAGNOSIS — Z1152 Encounter for screening for COVID-19: Secondary | ICD-10-CM | POA: Diagnosis not present

## 2023-09-30 DIAGNOSIS — Z809 Family history of malignant neoplasm, unspecified: Secondary | ICD-10-CM | POA: Diagnosis not present

## 2023-09-30 DIAGNOSIS — I447 Left bundle-branch block, unspecified: Secondary | ICD-10-CM | POA: Diagnosis not present

## 2023-09-30 DIAGNOSIS — S22081A Stable burst fracture of T11-T12 vertebra, initial encounter for closed fracture: Secondary | ICD-10-CM | POA: Diagnosis not present

## 2023-09-30 DIAGNOSIS — Z85828 Personal history of other malignant neoplasm of skin: Secondary | ICD-10-CM | POA: Diagnosis not present

## 2023-09-30 DIAGNOSIS — I421 Obstructive hypertrophic cardiomyopathy: Secondary | ICD-10-CM | POA: Diagnosis not present

## 2023-09-30 DIAGNOSIS — I340 Nonrheumatic mitral (valve) insufficiency: Secondary | ICD-10-CM | POA: Diagnosis not present

## 2023-09-30 DIAGNOSIS — J9611 Chronic respiratory failure with hypoxia: Secondary | ICD-10-CM | POA: Diagnosis not present

## 2023-09-30 DIAGNOSIS — Z7401 Bed confinement status: Secondary | ICD-10-CM | POA: Diagnosis not present

## 2023-09-30 DIAGNOSIS — M6281 Muscle weakness (generalized): Secondary | ICD-10-CM | POA: Diagnosis not present

## 2023-09-30 DIAGNOSIS — I6523 Occlusion and stenosis of bilateral carotid arteries: Secondary | ICD-10-CM | POA: Diagnosis not present

## 2023-09-30 DIAGNOSIS — J201 Acute bronchitis due to Hemophilus influenzae: Secondary | ICD-10-CM | POA: Diagnosis not present

## 2023-09-30 DIAGNOSIS — R7989 Other specified abnormal findings of blood chemistry: Secondary | ICD-10-CM | POA: Diagnosis not present

## 2023-09-30 DIAGNOSIS — I701 Atherosclerosis of renal artery: Secondary | ICD-10-CM | POA: Diagnosis not present

## 2023-09-30 DIAGNOSIS — I4719 Other supraventricular tachycardia: Secondary | ICD-10-CM | POA: Diagnosis not present

## 2023-09-30 DIAGNOSIS — Z803 Family history of malignant neoplasm of breast: Secondary | ICD-10-CM | POA: Diagnosis not present

## 2023-09-30 DIAGNOSIS — I509 Heart failure, unspecified: Secondary | ICD-10-CM | POA: Diagnosis not present

## 2023-09-30 DIAGNOSIS — E039 Hypothyroidism, unspecified: Secondary | ICD-10-CM | POA: Diagnosis not present

## 2023-09-30 DIAGNOSIS — D649 Anemia, unspecified: Secondary | ICD-10-CM | POA: Diagnosis not present

## 2023-09-30 DIAGNOSIS — R29898 Other symptoms and signs involving the musculoskeletal system: Secondary | ICD-10-CM | POA: Diagnosis not present

## 2023-09-30 DIAGNOSIS — I5032 Chronic diastolic (congestive) heart failure: Secondary | ICD-10-CM | POA: Diagnosis not present

## 2023-09-30 DIAGNOSIS — R0989 Other specified symptoms and signs involving the circulatory and respiratory systems: Secondary | ICD-10-CM | POA: Diagnosis not present

## 2023-09-30 DIAGNOSIS — J90 Pleural effusion, not elsewhere classified: Secondary | ICD-10-CM | POA: Diagnosis not present

## 2023-09-30 DIAGNOSIS — Z96612 Presence of left artificial shoulder joint: Secondary | ICD-10-CM | POA: Diagnosis not present

## 2023-09-30 DIAGNOSIS — Z7982 Long term (current) use of aspirin: Secondary | ICD-10-CM | POA: Diagnosis not present

## 2023-09-30 DIAGNOSIS — K219 Gastro-esophageal reflux disease without esophagitis: Secondary | ICD-10-CM | POA: Diagnosis not present

## 2023-09-30 DIAGNOSIS — I10 Essential (primary) hypertension: Secondary | ICD-10-CM | POA: Diagnosis not present

## 2023-09-30 DIAGNOSIS — R296 Repeated falls: Secondary | ICD-10-CM | POA: Diagnosis not present

## 2023-09-30 DIAGNOSIS — J101 Influenza due to other identified influenza virus with other respiratory manifestations: Secondary | ICD-10-CM | POA: Diagnosis not present

## 2023-09-30 DIAGNOSIS — Z87891 Personal history of nicotine dependence: Secondary | ICD-10-CM | POA: Diagnosis not present

## 2023-09-30 DIAGNOSIS — E871 Hypo-osmolality and hyponatremia: Secondary | ICD-10-CM | POA: Diagnosis not present

## 2023-09-30 DIAGNOSIS — S22000A Wedge compression fracture of unspecified thoracic vertebra, initial encounter for closed fracture: Secondary | ICD-10-CM | POA: Diagnosis not present

## 2023-09-30 DIAGNOSIS — E785 Hyperlipidemia, unspecified: Secondary | ICD-10-CM | POA: Diagnosis not present

## 2023-09-30 DIAGNOSIS — S22081D Stable burst fracture of T11-T12 vertebra, subsequent encounter for fracture with routine healing: Secondary | ICD-10-CM | POA: Diagnosis not present

## 2023-09-30 DIAGNOSIS — I3481 Nonrheumatic mitral (valve) annulus calcification: Secondary | ICD-10-CM | POA: Diagnosis not present

## 2023-09-30 DIAGNOSIS — R918 Other nonspecific abnormal finding of lung field: Secondary | ICD-10-CM | POA: Diagnosis not present

## 2023-09-30 DIAGNOSIS — I110 Hypertensive heart disease with heart failure: Secondary | ICD-10-CM | POA: Diagnosis not present

## 2023-09-30 DIAGNOSIS — I5033 Acute on chronic diastolic (congestive) heart failure: Secondary | ICD-10-CM | POA: Diagnosis not present

## 2023-09-30 DIAGNOSIS — J209 Acute bronchitis, unspecified: Secondary | ICD-10-CM | POA: Diagnosis not present

## 2023-09-30 DIAGNOSIS — I5A Non-ischemic myocardial injury (non-traumatic): Secondary | ICD-10-CM | POA: Diagnosis not present

## 2023-09-30 DIAGNOSIS — K5900 Constipation, unspecified: Secondary | ICD-10-CM | POA: Diagnosis not present

## 2023-09-30 DIAGNOSIS — R2689 Other abnormalities of gait and mobility: Secondary | ICD-10-CM | POA: Diagnosis not present

## 2023-09-30 DIAGNOSIS — R079 Chest pain, unspecified: Secondary | ICD-10-CM | POA: Diagnosis not present

## 2023-09-30 DIAGNOSIS — R062 Wheezing: Secondary | ICD-10-CM | POA: Diagnosis not present

## 2023-09-30 DIAGNOSIS — I2510 Atherosclerotic heart disease of native coronary artery without angina pectoris: Secondary | ICD-10-CM | POA: Diagnosis not present

## 2023-09-30 DIAGNOSIS — I2489 Other forms of acute ischemic heart disease: Secondary | ICD-10-CM | POA: Diagnosis not present

## 2023-09-30 DIAGNOSIS — I4891 Unspecified atrial fibrillation: Secondary | ICD-10-CM | POA: Diagnosis not present

## 2023-09-30 DIAGNOSIS — J9601 Acute respiratory failure with hypoxia: Secondary | ICD-10-CM | POA: Diagnosis not present

## 2023-09-30 DIAGNOSIS — N390 Urinary tract infection, site not specified: Secondary | ICD-10-CM | POA: Diagnosis not present

## 2023-09-30 DIAGNOSIS — R051 Acute cough: Secondary | ICD-10-CM | POA: Diagnosis not present

## 2023-09-30 LAB — CBC WITH DIFFERENTIAL/PLATELET
Abs Immature Granulocytes: 0.07 10*3/uL (ref 0.00–0.07)
Basophils Absolute: 0 10*3/uL (ref 0.0–0.1)
Basophils Relative: 0 %
Eosinophils Absolute: 0.5 10*3/uL (ref 0.0–0.5)
Eosinophils Relative: 4 %
HCT: 24.5 % — ABNORMAL LOW (ref 36.0–46.0)
Hemoglobin: 7.4 g/dL — ABNORMAL LOW (ref 12.0–15.0)
Immature Granulocytes: 1 %
Lymphocytes Relative: 28 %
Lymphs Abs: 3.1 10*3/uL (ref 0.7–4.0)
MCH: 26.4 pg (ref 26.0–34.0)
MCHC: 30.2 g/dL (ref 30.0–36.0)
MCV: 87.5 fL (ref 80.0–100.0)
Monocytes Absolute: 1 10*3/uL (ref 0.1–1.0)
Monocytes Relative: 9 %
Neutro Abs: 6.4 10*3/uL (ref 1.7–7.7)
Neutrophils Relative %: 58 %
Platelets: 194 10*3/uL (ref 150–400)
RBC: 2.8 MIL/uL — ABNORMAL LOW (ref 3.87–5.11)
RDW: 17.4 % — ABNORMAL HIGH (ref 11.5–15.5)
Smear Review: NORMAL
WBC: 11.1 10*3/uL — ABNORMAL HIGH (ref 4.0–10.5)
nRBC: 0 % (ref 0.0–0.2)

## 2023-09-30 LAB — BASIC METABOLIC PANEL
Anion gap: 6 (ref 5–15)
BUN: 11 mg/dL (ref 8–23)
CO2: 26 mmol/L (ref 22–32)
Calcium: 8.1 mg/dL — ABNORMAL LOW (ref 8.9–10.3)
Chloride: 100 mmol/L (ref 98–111)
Creatinine, Ser: 0.8 mg/dL (ref 0.44–1.00)
GFR, Estimated: >60 mL/min (ref >60)
Glucose, Bld: 115 mg/dL — ABNORMAL HIGH (ref 70–99)
Potassium: 4.1 mmol/L (ref 3.5–5.1)
Sodium: 132 mmol/L — ABNORMAL LOW (ref 135–145)

## 2023-09-30 MED ORDER — LIDOCAINE 5 % EX PTCH
1.0000 | MEDICATED_PATCH | CUTANEOUS | Status: AC
Start: 2023-10-01 — End: ?

## 2023-09-30 MED ORDER — MILK AND MOLASSES ENEMA
1.0000 | Freq: Once | RECTAL | Status: DC
  Filled 2023-09-30: qty 240

## 2023-09-30 MED ORDER — LACTULOSE 10 GM/15ML PO SOLN: 30.0000 g | Freq: Two times a day (BID) | ORAL | Status: AC | PRN

## 2023-09-30 MED ORDER — HYDRALAZINE HCL 25 MG PO TABS
25.0000 mg | ORAL_TABLET | Freq: Once | ORAL | Status: AC
  Administered 2023-09-30: 25 mg via ORAL
  Filled 2023-09-30: qty 1

## 2023-09-30 MED ORDER — TEMAZEPAM 15 MG PO CAPS: 15.0000 mg | ORAL_CAPSULE | Freq: Every evening | ORAL | 0 refills | Status: AC | PRN

## 2023-09-30 MED ORDER — POLYETHYLENE GLYCOL 3350 17 G PO PACK: 17.0000 g | PACK | Freq: Every day | ORAL | Status: DC

## 2023-09-30 MED ORDER — BISACODYL 5 MG PO TBEC: 5.0000 mg | DELAYED_RELEASE_TABLET | Freq: Every day | ORAL | Status: AC | PRN

## 2023-09-30 MED ORDER — HYDROCODONE-ACETAMINOPHEN 5-325 MG PO TABS
1.0000 | ORAL_TABLET | ORAL | 0 refills | Status: DC | PRN
Start: 2023-09-30 — End: 2023-10-16

## 2023-09-30 MED ORDER — DOCUSATE SODIUM 100 MG PO CAPS: 100.0000 mg | ORAL_CAPSULE | Freq: Two times a day (BID) | ORAL | Status: DC

## 2023-09-30 NOTE — Discharge Summary (Signed)
 Physician Discharge Summary   Patient: Kathryn Wise MRN: [id number] DOB: [birth year]  Admit date:     09/25/2023  Discharge date: 09/30/23  Discharge Physician: Jonah Blue   PCP: Enid Baas, MD   Recommendations at discharge:   You are being discharged to [facility] for rehabilitation Follow up with Dr. Nemiah Commander after discharge from rehab Home oxygen is being prescribed Follow up with neurosurgery in 2-4 weeks, call for appointment Continue to hold aspirin until restarted by PCP Resume bisoprolol Take Vicodin as needed for pain; do not drive or make important decisions while taking this medication Wear TLSO brace while upright  Discharge Diagnoses: Principal Problem:   T12 burst fracture (HCC) Active Problems:   UTI (urinary tract infection)   Hypothyroidism   Labile hypertension   Rectus sheath hematoma, subsequent encounter   Orthostatic hypotension    Hospital Course: 89yo with h/o collagen vascular disease, HTN, and hypothyroidism who presented on 2/26 with back pain. She was last hospitalized from 2/23-25 with a rectus sheath hematoma which appears to be stable.  T12 burst fracture, neurosurgery consulted, TLSO placed.  Likely to need SNF rehab.  Assessment and Plan:  T12 burst fracture 40% height loss Mechanical fall resulting in thoracic compression fracture, new from prior imaging 3 days prior Neurosurgery consulted, conservative management MRI thoracic and lumbar spines ordered and show similar findings as well as R > L facet arthrosis at L5-S1 with reactive marrow edema TLSO while upright Pain control with morphine, Norco, and lidocaine patch; add methocarbamol PT/OT consults F/u with neurosurgery in 2-4 weeks She is becoming more mobile with decreased pain and is medically stable for SNF rehab    UTI H/o recurrent UTI, had foley during last hospitalization, and now with hesitation and dysuria UA suggestive of UTI Culture with E coli (40k  colonies) resistant to Ampicillin/Unasyn Ceftriaxone daily x 2 -> Cefazolin, antibiotics completed   Hyponatremia Mild hyponatremia during last hospitalization Poor PO intake IVF with minimal improvement Added salt tabs TID x 3 days, improved   Constipation Patient feeling full, bloated, and now with n/v Reports no recent BM despite Colace, Miralax Added Dulcolax PO Tap water enema also ordered x 2 without relief However, her nausea and discomfort have resolved   Rectus sheath hematoma with ABLA Suspected to be from intractable coughing due to bronchitis prior to last admission Stable from prior IR consulted previously - opinion is the source of bleeding comes from small caliber muscular branch artery, challenging to reach and perform embolization IR and general surgeon recommended conservative management, pain control, series of H&H and transfuse for hemodynamic instability Hgb is slightly lower than prior but still >7, no indication for transfusion Abdominal binder Lidocaine patch Continue to hold ASA Stable CBC   HTN Left renal artery stenosis status post stenting in June 2020 History of atrial tachycardia Marginal BPs on presentation Orthostatic hypotension during last admission led to holding HTN medications and starting midodrine Continued midodrine on presentation and BP is normalizing Took bisoprolol and as needed amlodipine prior to last hospitalization Continue to hold bisoprolol for now BP range x 24 hours 122/53-153/94, no longer with documented hypotension Resume bisoprolol   Hypothyroidism Continue Synthroid   HLD Continue rosuvastatin   Dysphagia Chronic Soft diet ordered but patient requests to liberalize, changed to regular without difficulty   DNR Confirmed on admission   Deconditioning Patient fell during last hospitalization and was very weak on presentation this time PT/OT recommending SNF rehab and patient/family are in agreement  Offered a  bed at [facility], plan for [employer] 3/3         Consultants: Neurosurgery PT OT [facility] team   Procedures: None   Antibiotics: Ceftriaxone 2/26-27 Cefazolin 2/28-3/2    Pain control - [facility] Controlled Substance Reporting System database was reviewed. and patient was instructed, not to drive, operate heavy machinery, perform activities at heights, swimming or participation in water activities or provide baby-sitting services while on Pain, Sleep and Anxiety Medications; until their outpatient Physician has advised to do so again. Also recommended to not to take more than prescribed Pain, Sleep and Anxiety Medications.   Disposition: Skilled nursing facility Diet recommendation:  Regular diet DISCHARGE MEDICATION: Allergies as of 09/30/2023       Reactions   Alendronate Swelling   And nausea    Codeine    Lisinopril    Spike in BP   Metoprolol Other (See Comments)   Lethargy - PT CURRENTLY TAKING    Oxycodone    Other reaction(s): Other (See Comments) Altered mental status   Oxycontin [oxycodone Hcl]    Altered mental status        Medication List     PAUSE taking these medications    aspirin EC 81 MG tablet Wait to take this until your doctor or other care provider tells you to start again. Take 81 mg by mouth daily.       STOP taking these medications    chlorpheniramine-HYDROcodone 10-8 MG/5ML Commonly known as: TUSSIONEX   midodrine 5 MG tablet Commonly known as: PROAMATINE       TAKE these medications    albuterol 108 (90 Base) MCG/ACT inhaler Commonly known as: VENTOLIN HFA Inhale into the lungs.   amLODipine 5 MG tablet Commonly known as: NORVASC Take 1 tablet (5 mg total) by mouth as needed (Once daily AS NEEDED for SBP (top blood pressure number) is greater than 160.).   benzonatate 200 MG capsule Commonly known as: TESSALON Take 200 mg by mouth 3 (three) times daily as needed for cough.   bisacodyl 5 MG EC tablet Commonly  known as: DULCOLAX Take 1 tablet (5 mg total) by mouth daily as needed for moderate constipation.   bisoprolol 5 MG tablet Commonly known as: ZEBETA Take 1 tablet (5 mg total) by mouth every other day.   cyanocobalamin 1000 MCG tablet Commonly known as: VITAMIN B12 Take 1,000 mcg by mouth daily.   docusate sodium 100 MG capsule Commonly known as: COLACE Take 1 capsule (100 mg total) by mouth 2 (two) times daily.   HYDROcodone-acetaminophen 5-325 MG tablet Commonly known as: NORCO/VICODIN Take 1-2 tablets by mouth every 4 (four) hours as needed for moderate pain (pain score 4-6).   lactulose 10 GM/15ML solution Commonly known as: CHRONULAC Take 45 mLs (30 g total) by mouth 2 (two) times daily as needed for mild constipation.   levothyroxine 75 MCG tablet Commonly known as: SYNTHROID Take 75 mcg by mouth daily.   lidocaine 5 % Commonly known as: LIDODERM Place 1-3 patches onto the skin daily. Remove & Discard patch within 12 hours or as directed by MD Start taking on: October 01, 2023 What changed: how much to take   nitroGLYCERIN 0.4 MG SL tablet Commonly known as: NITROSTAT Place 1 tablet (0.4 mg total) under the tongue every 5 (five) minutes as needed for chest pain.   omeprazole 20 MG capsule Commonly known as: PRILOSEC TAKE 1 CAPSULE(20 MG) BY MOUTH EVERY MORNING AS NEEDED FOR HEARTBURN  polyethylene glycol 17 g packet Commonly known as: MIRALAX / GLYCOLAX Take 17 g by mouth daily. Start taking on: October 01, 2023 What changed:  when to take this reasons to take this   rosuvastatin 10 MG tablet Commonly known as: CRESTOR Take 1 tablet (10 mg total) by mouth at bedtime.   temazepam 15 MG capsule Commonly known as: RESTORIL Take 1 capsule (15 mg total) by mouth at bedtime as needed for sleep. What changed:  how much to take how to take this when to take this reasons to take this additional instructions   Turmeric 500 MG Caps Take 500 mg by mouth daily.    Vitamin D-3 25 MCG (1000 UT) Caps Take 1,000 Units by mouth daily.   Xiidra 5 % Soln Generic drug: Lifitegrast Apply 1 drop to eye 2 (two) times daily.               Durable Medical Equipment  (From admission, onward)           Start     Ordered   09/30/23 1149  DME Oxygen  Once       Question Answer Comment  Length of Need 6 Months   Oxygen delivery system Gas      09/30/23 1149            Contact information for after-discharge care     Destination     [facility] Preferred SNF .   Service: Skilled Nursing Contact information: [address] [place] [address] [phone]                    Discharge Exam:   Subjective: Feeling well and ready for rehab.  Large BM overnight.   Objective: Vitals:   09/29/23 2145 09/30/23 0844  BP: (!) 160/103 (!) 159/63  Pulse: 78 (!) 46  Resp:  16  Temp:  97.8 F (36.6 C)  SpO2: 95% 100%    Intake/Output Summary (Last 24 hours) at 09/30/2023 1149 Last data filed at 09/29/2023 1500 Gross per 24 hour  Intake 120 ml  Output --  Net 120 ml   Filed Weights   09/25/23 1236  Weight: 68 kg    Exam:  General:  Appears calm and comfortable and is in NAD, sitting up in bedside chair Eyes:  EOMI, normal lids, iris ENT:  grossly normal hearing, lips & tongue, mmm Neck:  no LAD, masses or thyromegaly Cardiovascular:  RRR, no m/r/g. No LE edema.  Respiratory:   CTA bilaterally with no wheezes/rales/rhonchi.  Normal respiratory effort. Abdomen:  soft, NT, ND Skin:  no rash or induration seen on limited exam Musculoskeletal:  grossly normal tone BUE/BLE, good ROM, no bony abnormality Psychiatric:  grossly normal mood and affect, speech fluent and appropriate, AOx3 Neurologic:  CN 2-12 grossly intact, moves all extremities in coordinated fashion   Data Reviewed: I have reviewed the patient's lab results since admission.  Pertinent labs for today  include:   Na++ 132, improving Glucose 115 WBC 11.1 Hgb 7.4, stable    Condition at discharge: improving  The results of significant diagnostics from this hospitalization (including imaging, microbiology, ancillary and laboratory) are listed below for reference.   Imaging Studies: DG Abd 1 View Result Date: 09/27/2023 CLINICAL DATA:  Constipation EXAM: ABDOMEN - 1 VIEW COMPARISON:  CT 09/25/2023 FINDINGS: The heart appears enlarged. Mild diffuse increased colon gas. Only mild stool in the right colon. No dilated small bowel. Pelvic  phleboliths. IMPRESSION: Nonobstructed gas pattern with mild stool in the right colon Electronically Signed   By: Jasmine Pang M.D.   On: 09/27/2023 16:39   MR LUMBAR SPINE WO CONTRAST Result Date: 09/25/2023 CLINICAL DATA:  Initial evaluation for acute trauma. EXAM: MRI LUMBAR SPINE WITHOUT CONTRAST TECHNIQUE: Multiplanar, multisequence MR imaging of the lumbar spine was performed. No intravenous contrast was administered. COMPARISON:  CT from earlier the same day. FINDINGS: Segmentation: Standard. Lowest well-formed disc space labeled the L5-S1 level. Alignment: Trace dextroscoliosis. 3 mm anterolisthesis of L4 on L5, with trace 2 mm retrolisthesis of L1 on L2 and L2 on L3. Findings chronic and facet mediated. Vertebrae: Acute burst type compression fracture involving the T12 vertebral body with 30% height loss and trace 2-3 mm bony retropulsion. No stenosis. Vertebral body height otherwise maintained without acute or chronic fracture. Bone marrow signal intensity within normal limits. No worrisome osseous lesions. Mild reactive marrow edema present about the right greater than left L5-S1 facets due to facet arthritis. Conus medullaris and cauda equina: Conus extends to the L1 level. Conus and cauda equina appear normal. Paraspinal and other soft tissues: Unremarkable. Disc levels: L1-2: Disc desiccation with diffuse disc bulge. Reactive endplate spurring.  Superimposed tiny central disc protrusion. Mild bilateral facet spurring. No spinal stenosis. Foramina remain patent. L2-3: Degenerate intervertebral disc space narrowing with disc desiccation and diffuse disc bulge. Reactive endplate spurring. No significant spinal stenosis. Foramina remain adequately patent. L3-4: Mild degenerative disc space narrowing with disc desiccation and diffuse disc bulge. Reactive endplate spurring, greater on the left. Mild bilateral facet hypertrophy. No significant spinal stenosis. Mild right with mild to moderate left L3 foraminal stenosis. L4-5: Disc desiccation with diffuse disc bulge. Moderate facet and ligament flavum hypertrophy. Associated small joint effusions. Resultant mild narrowing of the lateral recesses bilaterally. Central canal remains patent. No significant foraminal stenosis. L5-S1: Disc desiccation with mild disc bulge. Disc bulging slightly asymmetric to the right. Right-sided reactive endplate spurring. Moderate right worse than left facet arthrosis with reactive marrow edema and small joint effusions. Mild narrowing of the lateral recesses bilaterally. Central canal remains patent. Mild to moderate right L5 foraminal stenosis. Left neural foramina remains patent. IMPRESSION: 1. Acute burst type compression fracture involving the T12 vertebral body with 30% height loss and trace 2-3 mm bony retropulsion. No significant stenosis. 2. No other acute traumatic injury within the lumbar spine. 3. Mild to moderate left L3 and right L5 foraminal stenosis related to disc bulge and facet hypertrophy. 4. Moderate right worse than left facet arthrosis at L5-S1 with associated reactive marrow edema. Finding could contribute to lower back pain. Electronically Signed   By: Rise Mu M.D.   On: 09/25/2023 23:28   MR THORACIC SPINE WO CONTRAST Result Date: 09/25/2023 CLINICAL DATA:  Initial evaluation for acute trauma, abnormal CT. EXAM: MRI THORACIC SPINE WITHOUT  CONTRAST TECHNIQUE: Multiplanar, multisequence MR imaging of the thoracic spine was performed. No intravenous contrast was administered. COMPARISON:  CT from earlier the same day. FINDINGS: Alignment: Vertebral bodies normally aligned with preservation of the normal thoracic kyphosis. No listhesis. Vertebrae: Acute burst type compression fracture involving the T12 vertebral body again seen, corresponding with abnormality on prior CT. Associated central height loss measures up to 30% with trace 2-3 mm bony retropulsion. No stenosis. This is benign/mechanical in appearance. Vertebral body height otherwise maintained with no other acute or chronic fracture. Bone marrow signal intensity within normal limits. No discrete or worrisome osseous lesions. No other abnormal marrow  edema. Cord:  Normal signal and morphology. Paraspinal and other soft tissues: Mild edema seen within the paraspinous soft tissues adjacent to the T12 fracture. Major ligamentous structures appear intact. Moderate layering bilateral pleural effusions, right greater than left. Aortic atherosclerosis. Disc levels: No significant disc pathology seen within the thoracic spine for age. No spinal stenosis. Foramina remain patent. IMPRESSION: 1. Acute burst type compression fracture involving the T12 vertebral body with up to 30% height loss and trace 2-3 mm bony retropulsion. No significant stenosis. This is benign/mechanical in appearance. 2. No other acute traumatic injury within the thoracic spine. 3. Moderate layering bilateral pleural effusions, right greater than left. 4.  Aortic Atherosclerosis (ICD10-I70.0). Electronically Signed   By: Rise Mu M.D.   On: 09/25/2023 23:22   CT ABDOMEN PELVIS W CONTRAST Result Date: 09/25/2023 CLINICAL DATA:  Abdominal pain EXAM: CT ABDOMEN AND PELVIS WITH CONTRAST TECHNIQUE: Multidetector CT imaging of the abdomen and pelvis was performed using the standard protocol following bolus administration of  intravenous contrast. RADIATION DOSE REDUCTION: This exam was performed according to the departmental dose-optimization program which includes automated exposure control, adjustment of the mA and/or kV according to patient size and/or use of iterative reconstruction technique. CONTRAST:  OMNIPAQUE IOHEXOL 300 MG/ML  SOLN COMPARISON:  09/22/2023 FINDINGS: Lower chest: Trace bilateral pleural effusions. Dependent airspace consolidation in the left lower lobe. Moderate hiatal hernia. Hepatobiliary: No focal liver abnormality is seen. No gallstones, gallbladder wall thickening, or biliary dilatation. Pancreas: No new or acute abnormality. Spleen: Normal in size without focal abnormality. Adrenals/Urinary Tract: Adrenal glands are unremarkable. Kidneys are normal, without renal calculi, focal lesion, or hydronephrosis. Small amount of air within the urinary bladder. No appreciable bladder wall thickening. Stomach/Bowel: Moderate hiatal hernia. Stomach otherwise within normal limits. No evidence of bowel obstruction or active bowel inflammation. Moderate volume stool within the colon. Vascular/Lymphatic: Aortic atherosclerosis. No enlarged abdominal or pelvic lymph nodes. Reproductive: Fibroid uterus.  No adnexal masses. Other: No ascites or pneumoperitoneum. Musculoskeletal: Redemonstrated left rectus sheath hematoma measuring approximately 12.2 x 4.8 x 8.0 cm (previously 12.2 x 6.3 x 7.6 cm). Acute burst-type fracture of the T12 vertebral body with up to 40% vertebral body height loss. Approximately 3 mm of bony retropulsion. No evidence of fracture extension into the posterior elements. IMPRESSION: 1. Acute burst-type fracture of the T12 vertebral body with up to 40% vertebral body height loss. Approximately 3 mm of bony retropulsion. This is new from the prior study obtained 3 days ago. 2. Grossly stable appearance of left rectus sheath hematoma measuring approximately 12.2 x 4.8 x 8.0 cm (previously 12.2 x 6.3  x 7.6 cm). 3. Trace bilateral pleural effusions. Dependent airspace consolidation in the left lower lobe, which may represent atelectasis or pneumonia. 4. Small amount of air within the urinary bladder. Correlate for recent instrumentation. 5. Moderate hiatal hernia. 6. Fibroid uterus. 7. Aortic atherosclerosis (ICD10-I70.0). Electronically Signed   By: Duanne Guess D.O.   On: 09/25/2023 16:23   CT HEAD WO CONTRAST ( ) Result Date: 09/23/2023 CLINICAL DATA:  Provided history: Syncope/presyncope, cerebrovascular cause suspected. Additional history provided: Fall (with head trauma). EXAM: CT HEAD WITHOUT CONTRAST TECHNIQUE: Contiguous axial images were obtained from the base of the skull through the vertex without intravenous contrast. RADIATION DOSE REDUCTION: This exam was performed according to the departmental dose-optimization program which includes automated exposure control, adjustment of the mA and/or kV according to patient size and/or use of iterative reconstruction technique. COMPARISON:  Head CT 02/18/2023. FINDINGS:  Brain: Mild generalized cerebral atrophy. Patchy and ill-defined hypoattenuation within the cerebral white matter, nonspecific but compatible with mild chronic small vessel ischemic disease. Small chronic infarcts again demonstrated within the left cerebellar hemisphere. There is no acute intracranial hemorrhage. No demarcated cortical infarct. No extra-axial fluid collection. No evidence of an intracranial mass. No midline shift. Vascular: No hyperdense vessel.  Atherosclerotic calcifications. Skull: No calvarial fracture or aggressive osseous lesion. Sinuses/Orbits: No mass or acute finding within the imaged orbits. No significant paranasal sinus disease. IMPRESSION: 1. No evidence of an acute intracranial abnormality. 2. Mild cerebral white matter chronic small vessel ischemic disease. 3. Unchanged small chronic infarcts within left cerebellar hemisphere. 4. Mild cerebral atrophy.  Electronically Signed   By: Jackey Loge D.O.   On: 09/23/2023 13:34   CT ABDOMEN PELVIS W CONTRAST Result Date: 09/22/2023 CLINICAL DATA:  89 year old female with sudden onset severe left lower quadrant pain. EXAM: CT ABDOMEN AND PELVIS WITH CONTRAST TECHNIQUE: Multidetector CT imaging of the abdomen and pelvis was performed using the standard protocol following bolus administration of intravenous contrast. RADIATION DOSE REDUCTION: This exam was performed according to the departmental dose-optimization program which includes automated exposure control, adjustment of the mA and/or kV according to patient size and/or use of iterative reconstruction technique. CONTRAST:  OMNIPAQUE IOHEXOL 300 MG/ML  SOLN COMPARISON:  CT Abdomen and Pelvis 03/02/2023. FINDINGS: Lower chest: Stable mild cardiomegaly. Moderate size gastric hiatal hernia is stable. Trace layering pleural effusions have regressed since last year, resolved on the left. Hyperinflated lung bases suspicious for underlying emphysema. There is new atelectasis in the medial segment of the right middle lobe. Hepatobiliary: Negative liver and gallbladder. Pancreas: Stable 1 cm pancreatic head cystic mass series 2 image 30 (no follow up imaging recommended in this age group). Spleen: Negative. Adrenals/Urinary Tract: Normal adrenal glands. Nonobstructed kidneys with normal renal enhancement and contrast excretion. Nondilated ureters. Early urine contrast excretion to the bladder. Chronic pelvic phleboliths. Hematoma in the anterior space of Retzius, but otherwise negative urinary bladder. See additional details below. Stomach/Bowel: Redundant large bowel with mild diverticulosis and mild retained stool. Normal appendix on series 2, image 63. No dilated small bowel. Decompressed intra-abdominal portion of the stomach. Decompressed duodenum. No free air or free fluid. Vascular/Lymphatic: Extensive Aortoiliac calcified atherosclerosis. Major arterial  structures and portal venous system are patent. No lymphadenopathy. No retroperitoneal space hemorrhage. Reproductive: Stable and within normal limits. Other: No pelvic free fluid. Musculoskeletal: Chronic osteopenia. Stable vertebral height and alignment with minimal scoliosis. Sacrum and SI joints appear stable and intact. No acute osseous abnormality identified. Left lower abdominal wall large rectus intramuscular hemorrhage is new since August and there is a crescent shaped 2 cm area of contrast extravasation into the medial muscle on coronal image 29. The expanded lower rectus muscle encompasses about 63 x 76 x 122 mm (AP by transverse by CC) for hematoma volume of probably 100-200 mL. Surrounding inflammation in the ventral abdominal wall and thickened underlying ventral peritoneal lining. Hematoma is tracking into the space of Retzius at the symphysis. IMPRESSION: 1. Acute Left Lower Rectus Muscle Hemorrhage with active intramuscular bleeding (positive contrast extravasation). Expansion of the muscle and early extension of hematoma into the space of Retzius. Hematoma volume estimated at 100-200 mL. 2. No retroperitoneal hemorrhage at this time. No other acute or inflammatory process identified in the abdomen or pelvis. 3. Small pleural effusions regressed since August, trace on the right. 4. Multiple chronic findings as above. Electronically Signed   By:  Odessa Fleming M.D.   On: 09/22/2023 06:42   DG Chest Port 1 View Result Date: 09/22/2023 CLINICAL DATA:  Left lower quadrant pain. EXAM: PORTABLE CHEST 1 VIEW COMPARISON:  Portable chest 03/01/2023 FINDINGS: Stable moderate cardiomegaly.  No vascular congestion or edema. Stable mediastinum. The aorta is tortuous and calcified. The lungs are clear. No new osseous findings.  Osteopenia.  Left shoulder arthroplasty. IMPRESSION: No evidence of acute chest disease. Stable cardiomegaly. Aortic atherosclerosis. Electronically Signed   By: Almira Bar M.D.   On:  09/22/2023 06:07    Microbiology: Results for orders placed or performed during the hospital encounter of 09/25/23  Urine Culture     Status: Abnormal   Collection Time: 09/25/23  2:43 PM   Specimen: Urine, Clean Catch  Result Value Ref Range Status   Specimen Description   Final    URINE, CLEAN CATCH Performed at [facility], [address] [place], [place] [address]    Special Requests   Final    NONE Performed at [facility], [address] [place], [place] [address]    Culture 40,000 COLONIES/mL ESCHERICHIA COLI (A)  Final   Report Status 09/27/2023 FINAL  Final   Organism ID, Bacteria ESCHERICHIA COLI (A)  Final      Susceptibility   Escherichia coli - MIC*    AMPICILLIN >=32 RESISTANT Resistant     CEFAZOLIN <=4 SENSITIVE Sensitive     CEFEPIME <=0.12 SENSITIVE Sensitive     CEFTRIAXONE <=0.25 SENSITIVE Sensitive     CIPROFLOXACIN <=0.25 SENSITIVE Sensitive     GENTAMICIN <=1 SENSITIVE Sensitive     IMIPENEM <=0.25 SENSITIVE Sensitive     NITROFURANTOIN <=16 SENSITIVE Sensitive     TRIMETH/SULFA <=20 SENSITIVE Sensitive     AMPICILLIN/SULBACTAM 16 INTERMEDIATE Intermediate     PIP/TAZO <=4 SENSITIVE Sensitive ug/mL    * 40,000 COLONIES/mL ESCHERICHIA COLI    Labs: CBC: Recent Labs  Lab 09/26/23 0441 09/27/23 0431 09/28/23 0551 09/29/23 0430 09/30/23 0122  WBC 16.1* 12.9* 12.2* 10.8* 11.1*  NEUTROABS  --  8.4* 7.9* 6.7 6.4  HGB 7.7* 7.4* 7.9* 7.5* 7.4*  HCT 23.5* 23.1* 24.9* 24.0* 24.5*  MCV 83.3 84.6 85.0 87.0 87.5  PLT 169 170 169 180 194   Basic Metabolic Panel: Recent Labs  Lab 09/26/23 0441 09/27/23 0431 09/28/23 0551 09/29/23 0430 09/30/23 0122  NA 126* 125* 129* 131* 132*  K 4.1 4.0 4.4 4.2 4.1  CL 97* 97* 100 100 100  CO2 22 22 25 27 26   GLUCOSE 102* 91 101* 89 115*  BUN 15 12 11 10 11   CREATININE 0.80 0.74 0.70 0.88 0.80  CALCIUM 7.6* 7.6* 8.0* 8.0* 8.1*   Liver Function Tests: Recent Labs  Lab 09/25/23 1238   AST 27  ALT 17  ALKPHOS 42  BILITOT 1.8*  PROT 5.5*  ALBUMIN 2.6*   CBG: No results for input(s): "GLUCAP" in the last 168 hours.  Discharge time spent: greater than 30 minutes.  Signed: Jonah Blue, MD [facility] 09/30/2023

## 2023-09-30 NOTE — Plan of Care (Signed)

## 2023-09-30 NOTE — TOC Progression Note (Signed)
 Transition of Care [facility]) - Progression Note    Patient Details  Name: Kathryn Wise MRN: [id number] Date of Birth: [birth year]  [facility]) CM/SW Contact  Marlowe Sax, RN Phone Number: 09/30/2023, 4:39 PM  Clinical Narrative:     The patient is on Oxygen and it is acute the family will not be able to transport, I called [facility] and put her back on the list to transport, [facility] is aware  Expected Discharge Plan: Skilled Nursing Facility Barriers to Discharge: SNF Pending bed offer  Expected Discharge Plan and Services   Discharge Planning Services: CM Consult   Living arrangements for the past 2 months: Single Family Home Expected Discharge Date: 09/30/23                                     Social Determinants of Health (SDOH) Interventions SDOH Screenings   Food Insecurity: No Food Insecurity (09/25/2023)  Housing: Low Risk  (09/25/2023)  Transportation Needs: No Transportation Needs (09/25/2023)  Utilities: Not At Risk (09/25/2023)  Depression (PHQ2-9): Low Risk  (10/10/2021)  Recent Concern: Depression (PHQ2-9) - Medium Risk (09/26/2021)  Financial Resource Strain: Low Risk  (09/12/2023)   Received from [facility]  Physical Activity: Sufficiently Active (04/20/2020)  Social Connections: Moderately Isolated (09/25/2023)  Stress: No Stress Concern Present (04/20/2020)  Tobacco Use: Medium Risk (09/25/2023)    Readmission Risk Interventions     No data to display

## 2023-09-30 NOTE — TOC Progression Note (Signed)
 Transition of Care [facility]) - Progression Note    Patient Details  Name: Kathryn Wise MRN: [id number] Date of Birth: [birth year]  Transition of Care [facility]) CM/SW Contact  Marlowe Sax, RN Phone Number: 09/30/2023, 4:29 PM  Clinical Narrative:    Family called and stated that they do not want to continue to wait on EMS and that they are going to take the patient to [facility], I called EMS and cancelled   Expected Discharge Plan: Skilled Nursing Facility Barriers to Discharge: SNF Pending bed offer  Expected Discharge Plan and Services   Discharge Planning Services: CM Consult   Living arrangements for the past 2 months: Single Family Home Expected Discharge Date: 09/30/23                                     Social Determinants of Health (SDOH) Interventions SDOH Screenings   Food Insecurity: No Food Insecurity (09/25/2023)  Housing: Low Risk  (09/25/2023)  Transportation Needs: No Transportation Needs (09/25/2023)  Utilities: Not At Risk (09/25/2023)  Depression (PHQ2-9): Low Risk  (10/10/2021)  Recent Concern: Depression (PHQ2-9) - Medium Risk (09/26/2021)  Financial Resource Strain: Low Risk  (09/12/2023)   Received from [facility]  Physical Activity: Sufficiently Active (04/20/2020)  Social Connections: Moderately Isolated (09/25/2023)  Stress: No Stress Concern Present (04/20/2020)  Tobacco Use: Medium Risk (09/25/2023)    Readmission Risk Interventions     No data to display

## 2023-09-30 NOTE — TOC Progression Note (Signed)
 Transition of Care [facility]) - Progression Note    Patient Details  Name: Kathryn Wise MRN: [id number] Date of Birth: [birth year]  Transition of Care [facility]) CM/SW Contact  Marlowe Sax, RN Phone Number: 09/30/2023, 9:32 AM  Clinical Narrative:    Reached out to [facility] to inquire if the patient will be able to DC to there today, waiting on a response   Expected Discharge Plan: Skilled Nursing Facility Barriers to Discharge: SNF Pending bed offer  Expected Discharge Plan and Services   Discharge Planning Services: CM Consult   Living arrangements for the past 2 months: Single Family Home                                       Social Determinants of Health (SDOH) Interventions SDOH Screenings   Food Insecurity: No Food Insecurity (09/25/2023)  Housing: Low Risk  (09/25/2023)  Transportation Needs: No Transportation Needs (09/25/2023)  Utilities: Not At Risk (09/25/2023)  Depression (PHQ2-9): Low Risk  (10/10/2021)  Recent Concern: Depression (PHQ2-9) - Medium Risk (09/26/2021)  Financial Resource Strain: Low Risk  (09/12/2023)   Received from [facility]  Physical Activity: Sufficiently Active (04/20/2020)  Social Connections: Moderately Isolated (09/25/2023)  Stress: No Stress Concern Present (04/20/2020)  Tobacco Use: Medium Risk (09/25/2023)    Readmission Risk Interventions     No data to display

## 2023-09-30 NOTE — Progress Notes (Signed)
 Nurse Lauris Poag called back from [facility] and report was given. Pt is leaving via [employer] transportation.

## 2023-09-30 NOTE — Consult Note (Signed)
 [facility] Liaison Note  09/30/2023  [name] 04/24/1930 [id number]  Location: RN Hospital Liaison screened the patient remotely at [facility].  Insurance: Medicare   Kathryn Wise is a 89 y.o. female who is a Primary Care Patient of Enid Baas, MD-[facility].  The patient was screened for 7 and 30 days readmission hospitalization with noted high risk score for unplanned readmission risk with 2 IP in 6 months.  The patient was assessed for potential Care Management service needs for post hospital transition for care coordination. Review of patient's electronic medical record reveals patient was admitted for T12 Burst Fracture. Recommended for SNF level of care and will discharged today. Facility will continue to address pt's ongoing needs post discharge from the hospital.    [facility] Care Management/Population Health does not replace or interfere with any arrangements made by the Inpatient Transition of Care team.   For questions contact:   Elliot Cousin, RN, BSN Hospital Liaison [place]   [facility], Population Health Office Hours MTWF  8:00 am-6:00 pm Direct Dial: [phone] mobile [email or web address]

## 2023-09-30 NOTE — Progress Notes (Signed)
 Called [facility] to give report. The nurse Lauris Poag) is on lunch break. The writer left phone number so she can call me back. Pt is being picked up by [facility] transportation.

## 2023-09-30 NOTE — Progress Notes (Signed)
 Physical Therapy Treatment Patient Details Name: Kathryn Wise MRN: [id number] DOB: [birth year] Today's Date: 09/30/2023   History of Present Illness Kathryn Wise is a 89 y.o. female who presents with the chief complaint of severe back pain secondary to T12 compression fracture.  She was in her usual state of health until she was recently admitted for an abdominal wall tear after severe coughing bout.  She was discharged home but had a fall during her hospitalization. She came back in with intractable back pain in the midline with no radiation down her legs.  MD assessment includes T12 burst fracture and hyponatremia.    PT Comments  Pt was pleasant and motivated to participate during the session and put forth good effort throughout. Pt required only min assist with bed mobility tasks utilizing log roll technique for pain control.  Pt was able to stand from various height surfaces without physical assist with cues for proper UE placement.  Pt ambulated with flexed trunk posture and small, shuffling steps that each improved with cuing but with poor carryover.  Of note, pt found on 2L with SpO2 98%. Per nursing ok for trial on room air but with SpO2 dropping down to 85% at rest.  Pt returned to 2LO2/min with SpO2 WNL throughout.  Pt will benefit from continued PT services upon discharge to safely address deficits listed in patient problem list for decreased caregiver assistance and eventual return to PLOF.       If plan is discharge home, recommend the following: Assist for transportation;Help with stairs or ramp for entrance;A little help with walking and/or transfers;A little help with bathing/dressing/bathroom   Can travel by private vehicle     Yes  Equipment Recommendations  Other (comment) (TBD)    Recommendations for Other Services       Precautions / Restrictions Precautions Precautions: Fall Precaution/Restrictions Comments: No Spinal Precautions noted in chart, TLSO when  ambulating Spinal Brace: Thoracolumbosacral orthotic Restrictions Other Position/Activity Restrictions: Watch BP     Mobility  Bed Mobility     Rolling: Min assist Sidelying to sit: Min assist       General bed mobility comments: VC's for log roll sequencing and min A for BLE and trunk control    Transfers Overall transfer level: Needs assistance Equipment used: Rolling walker (2 wheels) Transfers: Sit to/from Stand Sit to Stand: Contact guard assist           General transfer comment: Min verbal cues for sequencing    Ambulation/Gait Ambulation/Gait assistance: Contact guard assist Gait Distance (Feet): 40 Feet x 1, 10 Feet x 1 Assistive device: Rolling walker (2 wheels) Gait Pattern/deviations: Step-through pattern, Decreased step length - right, Decreased step length - left, Shuffle Gait velocity: decreased     General Gait Details: Slow cadence with mostly shuffling steps that improved with cuing; cuing provided for amb closer to the RW with upright posture   Stairs             Wheelchair Mobility     Tilt Bed    Modified Rankin (Stroke Patients Only)       Balance Overall balance assessment: Needs assistance Sitting-balance support: Feet supported, Bilateral upper extremity supported Sitting balance-Leahy Scale: Good     Standing balance support: During functional activity, Bilateral upper extremity supported, Reliant on assistive device for balance Standing balance-Leahy Scale: Fair  Communication Communication Communication: No apparent difficulties  Cognition Arousal: Alert Behavior During Therapy: WFL for tasks assessed/performed   PT - Cognitive impairments: No apparent impairments                         Following commands: Intact      Cueing Cueing Techniques: Verbal cues  Exercises Total Joint Exercises Ankle Circles/Pumps: AROM, Strengthening, Both, 10 reps, 15  reps Quad Sets: Strengthening, Both, 10 reps, 15 reps Other Exercises Other Exercises: Log roll training    General Comments        Pertinent Vitals/Pain Pain Assessment Pain Assessment: No/denies pain    Home Living                          Prior Function            PT Goals (current goals can now be found in the care plan section) Progress towards PT goals: Progressing toward goals    Frequency    Min 1X/week      PT Plan      Co-evaluation              AM-PAC PT "6 Clicks" Mobility   Outcome Measure  Help needed turning from your back to your side while in a flat bed without using bedrails?: A Little Help needed moving from lying on your back to sitting on the side of a flat bed without using bedrails?: A Little Help needed moving to and from a bed to a chair (including a wheelchair)?: A Little Help needed standing up from a chair using your arms (e.g., wheelchair or bedside chair)?: A Little Help needed to walk in hospital room?: A Little Help needed climbing 3-5 steps with a railing? : A Lot 6 Click Score: 17    End of Session Equipment Utilized During Treatment: Gait belt;Back brace Activity Tolerance: Patient tolerated treatment well Patient left: in chair;with call bell/phone within reach;with chair alarm set;with family/visitor present Nurse Communication: Mobility status PT Visit Diagnosis: Unsteadiness on feet (R26.81);Other abnormalities of gait and mobility (R26.89);Muscle weakness (generalized) (M62.81);Difficulty in walking, not elsewhere classified (R26.2)     Time: 0865-7846 PT Time Calculation (min) (ACUTE ONLY): 32 min  Charges:    $Gait Training: 8-22 mins $Therapeutic Activity: 8-22 mins PT General Charges $$ ACUTE PT VISIT: 1 Visit                     .Ovidio Hanger PT, DPT 09/30/23, 11:21 AM

## 2023-09-30 NOTE — TOC Progression Note (Signed)
 Transition of Care [facility]) - Progression Note    Patient Details  Name: NOA GALVAO MRN: [id number] Date of Birth: Aug 17, 1929  Transition of Care [facility]) CM/SW Contact  Marlowe Sax, RN Phone Number: 09/30/2023, 12:59 PM  Clinical Narrative:     Azucena Freed the patient's grand daughter to let her know about the DC, left a HIPAA Compliant VM She called me back and I let her know she will go to room [facility] to [facility], she is agreeable and will let the family know, I called EMS to arrange transport  Expected Discharge Plan: Skilled Nursing Facility Barriers to Discharge: SNF Pending bed offer  Expected Discharge Plan and Services   Discharge Planning Services: CM Consult   Living arrangements for the past 2 months: Single Family Home Expected Discharge Date: 09/30/23                                     Social Determinants of Health (SDOH) Interventions SDOH Screenings   Food Insecurity: No Food Insecurity (09/25/2023)  Housing: Low Risk  (09/25/2023)  Transportation Needs: No Transportation Needs (09/25/2023)  Utilities: Not At Risk (09/25/2023)  Depression (PHQ2-9): Low Risk  (10/10/2021)  Recent Concern: Depression (PHQ2-9) - Medium Risk (09/26/2021)  Financial Resource Strain: Low Risk  (09/12/2023)   Received from [facility]  Physical Activity: Sufficiently Active (04/20/2020)  Social Connections: Moderately Isolated (09/25/2023)  Stress: No Stress Concern Present (04/20/2020)  Tobacco Use: Medium Risk (09/25/2023)    Readmission Risk Interventions     No data to display

## 2023-10-01 DIAGNOSIS — S22081D Stable burst fracture of T11-T12 vertebra, subsequent encounter for fracture with routine healing: Secondary | ICD-10-CM | POA: Diagnosis not present

## 2023-10-01 DIAGNOSIS — S22000A Wedge compression fracture of unspecified thoracic vertebra, initial encounter for closed fracture: Secondary | ICD-10-CM | POA: Diagnosis not present

## 2023-10-01 DIAGNOSIS — M6281 Muscle weakness (generalized): Secondary | ICD-10-CM | POA: Diagnosis not present

## 2023-10-01 DIAGNOSIS — N390 Urinary tract infection, site not specified: Secondary | ICD-10-CM | POA: Diagnosis not present

## 2023-10-01 DIAGNOSIS — K5900 Constipation, unspecified: Secondary | ICD-10-CM | POA: Diagnosis not present

## 2023-10-01 DIAGNOSIS — E871 Hypo-osmolality and hyponatremia: Secondary | ICD-10-CM | POA: Diagnosis not present

## 2023-10-01 DIAGNOSIS — I10 Essential (primary) hypertension: Secondary | ICD-10-CM | POA: Diagnosis not present

## 2023-10-01 DIAGNOSIS — S301XXA Contusion of abdominal wall, initial encounter: Secondary | ICD-10-CM | POA: Diagnosis not present

## 2023-10-01 DIAGNOSIS — R2689 Other abnormalities of gait and mobility: Secondary | ICD-10-CM | POA: Diagnosis not present

## 2023-10-01 DIAGNOSIS — R296 Repeated falls: Secondary | ICD-10-CM | POA: Diagnosis not present

## 2023-10-03 DIAGNOSIS — I10 Essential (primary) hypertension: Secondary | ICD-10-CM | POA: Diagnosis not present

## 2023-10-03 DIAGNOSIS — R296 Repeated falls: Secondary | ICD-10-CM | POA: Diagnosis not present

## 2023-10-03 DIAGNOSIS — S301XXA Contusion of abdominal wall, initial encounter: Secondary | ICD-10-CM | POA: Diagnosis not present

## 2023-10-03 DIAGNOSIS — S22000A Wedge compression fracture of unspecified thoracic vertebra, initial encounter for closed fracture: Secondary | ICD-10-CM | POA: Diagnosis not present

## 2023-10-03 DIAGNOSIS — E871 Hypo-osmolality and hyponatremia: Secondary | ICD-10-CM | POA: Diagnosis not present

## 2023-10-03 DIAGNOSIS — K5900 Constipation, unspecified: Secondary | ICD-10-CM | POA: Diagnosis not present

## 2023-10-03 DIAGNOSIS — N390 Urinary tract infection, site not specified: Secondary | ICD-10-CM | POA: Diagnosis not present

## 2023-10-04 ENCOUNTER — Telehealth: Payer: Self-pay | Admitting: Neurosurgery

## 2023-10-04 DIAGNOSIS — R2689 Other abnormalities of gait and mobility: Secondary | ICD-10-CM | POA: Diagnosis not present

## 2023-10-04 DIAGNOSIS — S22081D Stable burst fracture of T11-T12 vertebra, subsequent encounter for fracture with routine healing: Secondary | ICD-10-CM | POA: Diagnosis not present

## 2023-10-04 DIAGNOSIS — M6281 Muscle weakness (generalized): Secondary | ICD-10-CM | POA: Diagnosis not present

## 2023-10-04 NOTE — Telephone Encounter (Signed)
 10/16/2023 with Kathryn Wise.

## 2023-10-04 NOTE — Telephone Encounter (Signed)
 Lovenia Kim, MD  [facility]: Okay for APP-will be a return visit since he did consult on 09/26/2023 Timeline: 2-4wks Tests to order: upright thoracolumbar xrays  Patient is currently at [facility] [phone]. A message was left for transportation to call the office to schedule appt.

## 2023-10-07 DIAGNOSIS — N390 Urinary tract infection, site not specified: Secondary | ICD-10-CM | POA: Diagnosis not present

## 2023-10-07 DIAGNOSIS — E871 Hypo-osmolality and hyponatremia: Secondary | ICD-10-CM | POA: Diagnosis not present

## 2023-10-07 DIAGNOSIS — R051 Acute cough: Secondary | ICD-10-CM | POA: Diagnosis not present

## 2023-10-07 DIAGNOSIS — I10 Essential (primary) hypertension: Secondary | ICD-10-CM | POA: Diagnosis not present

## 2023-10-07 DIAGNOSIS — R296 Repeated falls: Secondary | ICD-10-CM | POA: Diagnosis not present

## 2023-10-07 DIAGNOSIS — K5900 Constipation, unspecified: Secondary | ICD-10-CM | POA: Diagnosis not present

## 2023-10-07 DIAGNOSIS — S22000A Wedge compression fracture of unspecified thoracic vertebra, initial encounter for closed fracture: Secondary | ICD-10-CM | POA: Diagnosis not present

## 2023-10-07 DIAGNOSIS — S301XXA Contusion of abdominal wall, initial encounter: Secondary | ICD-10-CM | POA: Diagnosis not present

## 2023-10-07 DIAGNOSIS — R062 Wheezing: Secondary | ICD-10-CM | POA: Diagnosis not present

## 2023-10-08 ENCOUNTER — Inpatient Hospital Stay

## 2023-10-08 ENCOUNTER — Other Ambulatory Visit: Payer: Self-pay

## 2023-10-08 ENCOUNTER — Inpatient Hospital Stay
Admission: EM | Admit: 2023-10-08 | Discharge: 2023-10-12 | DRG: 193 | Disposition: A | Discharge status: Home Health | Source: Ambulatory Visit | Attending: Internal Medicine | Admitting: Internal Medicine

## 2023-10-08 ENCOUNTER — Ambulatory Visit: Payer: Medicare Other | Attending: Physician Assistant | Admitting: Physician Assistant

## 2023-10-08 ENCOUNTER — Emergency Department

## 2023-10-08 ENCOUNTER — Observation Stay

## 2023-10-08 ENCOUNTER — Encounter: Payer: Self-pay | Admitting: Physician Assistant

## 2023-10-08 VITALS — BP 106/64 | HR 74 | Ht 65.0 in | Wt 152.4 lb

## 2023-10-08 DIAGNOSIS — D649 Anemia, unspecified: Secondary | ICD-10-CM | POA: Diagnosis present

## 2023-10-08 DIAGNOSIS — Z888 Allergy status to other drugs, medicaments and biological substances status: Secondary | ICD-10-CM

## 2023-10-08 DIAGNOSIS — J9611 Chronic respiratory failure with hypoxia: Secondary | ICD-10-CM | POA: Diagnosis present

## 2023-10-08 DIAGNOSIS — S22000A Wedge compression fracture of unspecified thoracic vertebra, initial encounter for closed fracture: Secondary | ICD-10-CM | POA: Diagnosis not present

## 2023-10-08 DIAGNOSIS — Z87891 Personal history of nicotine dependence: Secondary | ICD-10-CM

## 2023-10-08 DIAGNOSIS — J209 Acute bronchitis, unspecified: Secondary | ICD-10-CM | POA: Diagnosis not present

## 2023-10-08 DIAGNOSIS — E871 Hypo-osmolality and hyponatremia: Secondary | ICD-10-CM | POA: Diagnosis not present

## 2023-10-08 DIAGNOSIS — J101 Influenza due to other identified influenza virus with other respiratory manifestations: Secondary | ICD-10-CM | POA: Diagnosis not present

## 2023-10-08 DIAGNOSIS — J90 Pleural effusion, not elsewhere classified: Secondary | ICD-10-CM | POA: Diagnosis not present

## 2023-10-08 DIAGNOSIS — I5032 Chronic diastolic (congestive) heart failure: Secondary | ICD-10-CM | POA: Diagnosis not present

## 2023-10-08 DIAGNOSIS — S301XXA Contusion of abdominal wall, initial encounter: Secondary | ICD-10-CM | POA: Diagnosis not present

## 2023-10-08 DIAGNOSIS — Z803 Family history of malignant neoplasm of breast: Secondary | ICD-10-CM

## 2023-10-08 DIAGNOSIS — J9601 Acute respiratory failure with hypoxia: Secondary | ICD-10-CM | POA: Diagnosis not present

## 2023-10-08 DIAGNOSIS — R7989 Other specified abnormal findings of blood chemistry: Secondary | ICD-10-CM | POA: Diagnosis not present

## 2023-10-08 DIAGNOSIS — E039 Hypothyroidism, unspecified: Secondary | ICD-10-CM | POA: Diagnosis not present

## 2023-10-08 DIAGNOSIS — R0989 Other specified symptoms and signs involving the circulatory and respiratory systems: Secondary | ICD-10-CM | POA: Insufficient documentation

## 2023-10-08 DIAGNOSIS — J201 Acute bronchitis due to Hemophilus influenzae: Secondary | ICD-10-CM | POA: Insufficient documentation

## 2023-10-08 DIAGNOSIS — I5033 Acute on chronic diastolic (congestive) heart failure: Secondary | ICD-10-CM | POA: Diagnosis present

## 2023-10-08 DIAGNOSIS — I4891 Unspecified atrial fibrillation: Secondary | ICD-10-CM | POA: Diagnosis not present

## 2023-10-08 DIAGNOSIS — Z87440 Personal history of urinary (tract) infections: Secondary | ICD-10-CM

## 2023-10-08 DIAGNOSIS — I5A Non-ischemic myocardial injury (non-traumatic): Secondary | ICD-10-CM | POA: Diagnosis present

## 2023-10-08 DIAGNOSIS — Z85828 Personal history of other malignant neoplasm of skin: Secondary | ICD-10-CM | POA: Diagnosis not present

## 2023-10-08 DIAGNOSIS — I447 Left bundle-branch block, unspecified: Secondary | ICD-10-CM | POA: Insufficient documentation

## 2023-10-08 DIAGNOSIS — I2510 Atherosclerotic heart disease of native coronary artery without angina pectoris: Secondary | ICD-10-CM | POA: Diagnosis present

## 2023-10-08 DIAGNOSIS — Z8619 Personal history of other infectious and parasitic diseases: Secondary | ICD-10-CM

## 2023-10-08 DIAGNOSIS — Z79890 Hormone replacement therapy (postmenopausal): Secondary | ICD-10-CM | POA: Diagnosis not present

## 2023-10-08 DIAGNOSIS — Z885 Allergy status to narcotic agent status: Secondary | ICD-10-CM

## 2023-10-08 DIAGNOSIS — I10 Essential (primary) hypertension: Secondary | ICD-10-CM | POA: Diagnosis not present

## 2023-10-08 DIAGNOSIS — I6523 Occlusion and stenosis of bilateral carotid arteries: Secondary | ICD-10-CM | POA: Insufficient documentation

## 2023-10-08 DIAGNOSIS — I4719 Other supraventricular tachycardia: Secondary | ICD-10-CM | POA: Diagnosis not present

## 2023-10-08 DIAGNOSIS — I701 Atherosclerosis of renal artery: Secondary | ICD-10-CM | POA: Diagnosis not present

## 2023-10-08 DIAGNOSIS — I110 Hypertensive heart disease with heart failure: Secondary | ICD-10-CM | POA: Diagnosis present

## 2023-10-08 DIAGNOSIS — I340 Nonrheumatic mitral (valve) insufficiency: Secondary | ICD-10-CM | POA: Diagnosis not present

## 2023-10-08 DIAGNOSIS — I421 Obstructive hypertrophic cardiomyopathy: Secondary | ICD-10-CM | POA: Diagnosis not present

## 2023-10-08 DIAGNOSIS — Z809 Family history of malignant neoplasm, unspecified: Secondary | ICD-10-CM | POA: Diagnosis not present

## 2023-10-08 DIAGNOSIS — K219 Gastro-esophageal reflux disease without esophagitis: Secondary | ICD-10-CM | POA: Diagnosis present

## 2023-10-08 DIAGNOSIS — Z7982 Long term (current) use of aspirin: Secondary | ICD-10-CM

## 2023-10-08 DIAGNOSIS — R296 Repeated falls: Secondary | ICD-10-CM | POA: Diagnosis not present

## 2023-10-08 DIAGNOSIS — S22081A Stable burst fracture of T11-T12 vertebra, initial encounter for closed fracture: Secondary | ICD-10-CM | POA: Diagnosis present

## 2023-10-08 DIAGNOSIS — Z66 Do not resuscitate: Secondary | ICD-10-CM | POA: Diagnosis present

## 2023-10-08 DIAGNOSIS — I2489 Other forms of acute ischemic heart disease: Secondary | ICD-10-CM | POA: Diagnosis present

## 2023-10-08 DIAGNOSIS — Z1152 Encounter for screening for COVID-19: Secondary | ICD-10-CM | POA: Diagnosis not present

## 2023-10-08 DIAGNOSIS — Z79899 Other long term (current) drug therapy: Secondary | ICD-10-CM

## 2023-10-08 DIAGNOSIS — J111 Influenza due to unidentified influenza virus with other respiratory manifestations: Secondary | ICD-10-CM | POA: Diagnosis present

## 2023-10-08 DIAGNOSIS — I3481 Nonrheumatic mitral (valve) annulus calcification: Secondary | ICD-10-CM | POA: Diagnosis not present

## 2023-10-08 DIAGNOSIS — I509 Heart failure, unspecified: Principal | ICD-10-CM

## 2023-10-08 DIAGNOSIS — E785 Hyperlipidemia, unspecified: Secondary | ICD-10-CM | POA: Diagnosis present

## 2023-10-08 DIAGNOSIS — K5900 Constipation, unspecified: Secondary | ICD-10-CM | POA: Diagnosis not present

## 2023-10-08 DIAGNOSIS — Z96619 Presence of unspecified artificial shoulder joint: Secondary | ICD-10-CM | POA: Diagnosis present

## 2023-10-08 DIAGNOSIS — Z96612 Presence of left artificial shoulder joint: Secondary | ICD-10-CM | POA: Diagnosis not present

## 2023-10-08 DIAGNOSIS — R000 Tachycardia, unspecified: Secondary | ICD-10-CM | POA: Diagnosis present

## 2023-10-08 DIAGNOSIS — N390 Urinary tract infection, site not specified: Secondary | ICD-10-CM | POA: Diagnosis not present

## 2023-10-08 DIAGNOSIS — R918 Other nonspecific abnormal finding of lung field: Secondary | ICD-10-CM | POA: Diagnosis not present

## 2023-10-08 DIAGNOSIS — R079 Chest pain, unspecified: Secondary | ICD-10-CM | POA: Diagnosis not present

## 2023-10-08 LAB — BASIC METABOLIC PANEL
Anion gap: 7 (ref 5–15)
Anion gap: 10 (ref 5–15)
BUN: 13 mg/dL (ref 8–23)
BUN: 13 mg/dL (ref 8–23)
CO2: 27 mmol/L (ref 22–32)
CO2: 26 mmol/L (ref 22–32)
Calcium: 8.2 mg/dL — ABNORMAL LOW (ref 8.9–10.3)
Calcium: 8.4 mg/dL — ABNORMAL LOW (ref 8.9–10.3)
Chloride: 94 mmol/L — ABNORMAL LOW (ref 98–111)
Chloride: 93 mmol/L — ABNORMAL LOW (ref 98–111)
Creatinine, Ser: 0.9 mg/dL (ref 0.44–1.00)
Creatinine, Ser: 0.91 mg/dL (ref 0.44–1.00)
GFR, Estimated: >60 mL/min (ref >60)
GFR, Estimated: 60 mL/min — ABNORMAL LOW (ref >60)
Glucose, Bld: 151 mg/dL — ABNORMAL HIGH (ref 70–99)
Glucose, Bld: 128 mg/dL — ABNORMAL HIGH (ref 70–99)
Potassium: 4 mmol/L (ref 3.5–5.1)
Potassium: 4.5 mmol/L (ref 3.5–5.1)
Sodium: 128 mmol/L — ABNORMAL LOW (ref 135–145)
Sodium: 129 mmol/L — ABNORMAL LOW (ref 135–145)

## 2023-10-08 LAB — URINALYSIS, ROUTINE W REFLEX MICROSCOPIC
Bacteria, UA: NONE SEEN
Bilirubin Urine: NEGATIVE
Glucose, UA: NEGATIVE mg/dL
Ketones, ur: NEGATIVE mg/dL
Leukocytes,Ua: NEGATIVE
Nitrite: NEGATIVE
Protein, ur: NEGATIVE mg/dL
RBC / HPF: 0 RBC/hpf (ref 0–5)
Specific Gravity, Urine: 1.005 (ref 1.005–1.030)
pH: 6 (ref 5.0–8.0)

## 2023-10-08 LAB — CBC
HCT: 28.6 % — ABNORMAL LOW (ref 36.0–46.0)
Hemoglobin: 8.8 g/dL — ABNORMAL LOW (ref 12.0–15.0)
MCH: 27.1 pg (ref 26.0–34.0)
MCHC: 30.8 g/dL (ref 30.0–36.0)
MCV: 88 fL (ref 80.0–100.0)
Platelets: 210 10*3/uL (ref 150–400)
RBC: 3.25 MIL/uL — ABNORMAL LOW (ref 3.87–5.11)
RDW: 18.3 % — ABNORMAL HIGH (ref 11.5–15.5)
WBC: 5.5 10*3/uL (ref 4.0–10.5)
nRBC: 0 % (ref 0.0–0.2)

## 2023-10-08 LAB — TROPONIN I (HIGH SENSITIVITY)
Troponin I (High Sensitivity): 76 ng/L — ABNORMAL HIGH (ref <18)
Troponin I (High Sensitivity): 73 ng/L — ABNORMAL HIGH (ref <18)

## 2023-10-08 LAB — RESP PANEL BY RT-PCR (RSV, FLU A&B, COVID)  RVPGX2
Influenza A by PCR: POSITIVE — AB
Influenza B by PCR: NEGATIVE
Resp Syncytial Virus by PCR: NEGATIVE
SARS Coronavirus 2 by RT PCR: NEGATIVE

## 2023-10-08 LAB — PROTIME-INR
INR: 1 (ref 0.8–1.2)
Prothrombin Time: 13.7 s (ref 11.4–15.2)

## 2023-10-08 LAB — BRAIN NATRIURETIC PEPTIDE: B Natriuretic Peptide: 763.5 pg/mL — ABNORMAL HIGH (ref 0.0–100.0)

## 2023-10-08 LAB — D-DIMER, QUANTITATIVE: D-Dimer, Quant: 4.34 ug{FEU}/mL — ABNORMAL HIGH (ref 0.00–0.50)

## 2023-10-08 LAB — MAGNESIUM: Magnesium: 2.3 mg/dL (ref 1.7–2.4)

## 2023-10-08 LAB — APTT: aPTT: 33 s (ref 24–36)

## 2023-10-08 LAB — PHOSPHORUS: Phosphorus: 2.8 mg/dL (ref 2.5–4.6)

## 2023-10-08 MED ORDER — OSELTAMIVIR PHOSPHATE 30 MG PO CAPS
30.0000 mg | ORAL_CAPSULE | Freq: Two times a day (BID) | ORAL | Status: DC
  Administered 2023-10-08 – 2023-10-12 (×8): 30 mg via ORAL
  Filled 2023-10-08 (×8): qty 1

## 2023-10-08 MED ORDER — ROSUVASTATIN CALCIUM 10 MG PO TABS
10.0000 mg | ORAL_TABLET | Freq: Every day | ORAL | Status: DC
  Administered 2023-10-08 – 2023-10-11 (×4): 10 mg via ORAL
  Filled 2023-10-08 (×4): qty 1

## 2023-10-08 MED ORDER — AMIODARONE HCL IN DEXTROSE 360-4.14 MG/200ML-% IV SOLN: 60.0000 mg/h | INTRAVENOUS | Status: DC

## 2023-10-08 MED ORDER — DEXTROSE 5 % IV SOLN: 5.0000 ug/kg/min | INTRAVENOUS | Status: DC

## 2023-10-08 MED ORDER — LIFITEGRAST 5 % OP SOLN: 1.0000 [drp] | Freq: Two times a day (BID) | OPHTHALMIC | Status: DC

## 2023-10-08 MED ORDER — METHYLPREDNISOLONE SODIUM SUCC 40 MG IJ SOLR
40.0000 mg | Freq: Two times a day (BID) | INTRAMUSCULAR | Status: DC
Start: 2023-10-08 — End: 2023-10-10
  Administered 2023-10-08 – 2023-10-10 (×4): 40 mg via INTRAVENOUS
  Filled 2023-10-08 (×5): qty 1

## 2023-10-08 MED ORDER — ACETAMINOPHEN 325 MG PO TABS
650.0000 mg | ORAL_TABLET | Freq: Four times a day (QID) | ORAL | Status: DC | PRN
  Administered 2023-10-11: 650 mg via ORAL
  Filled 2023-10-08: qty 2

## 2023-10-08 MED ORDER — PANTOPRAZOLE SODIUM 40 MG PO TBEC
40.0000 mg | DELAYED_RELEASE_TABLET | Freq: Every day | ORAL | Status: DC
  Administered 2023-10-08 – 2023-10-12 (×5): 40 mg via ORAL
  Filled 2023-10-08 (×5): qty 1

## 2023-10-08 MED ORDER — BISOPROLOL FUMARATE 5 MG PO TABS
5.0000 mg | ORAL_TABLET | ORAL | Status: DC
  Administered 2023-10-10 – 2023-10-12 (×2): 5 mg via ORAL
  Filled 2023-10-08 (×2): qty 1

## 2023-10-08 MED ORDER — FUROSEMIDE 10 MG/ML IJ SOLN
40.0000 mg | Freq: Once | INTRAMUSCULAR | Status: AC
  Administered 2023-10-08: 40 mg via INTRAVENOUS
  Filled 2023-10-08: qty 4

## 2023-10-08 MED ORDER — ASPIRIN 81 MG PO CHEW
324.0000 mg | CHEWABLE_TABLET | Freq: Once | ORAL | Status: AC
  Administered 2023-10-08: 324 mg via ORAL
  Filled 2023-10-08: qty 4

## 2023-10-08 MED ORDER — DM-GUAIFENESIN ER 30-600 MG PO TB12: 1.0000 | ORAL_TABLET | Freq: Two times a day (BID) | ORAL | Status: DC | PRN

## 2023-10-08 MED ORDER — LEVOTHYROXINE SODIUM 50 MCG PO TABS
75.0000 ug | ORAL_TABLET | Freq: Every day | ORAL | Status: DC
  Administered 2023-10-08 – 2023-10-11 (×4): 75 ug via ORAL
  Filled 2023-10-08 (×2): qty 1
  Filled 2023-10-08 (×2): qty 2

## 2023-10-08 MED ORDER — TEMAZEPAM 15 MG PO CAPS
15.0000 mg | ORAL_CAPSULE | Freq: Every evening | ORAL | Status: DC | PRN
  Administered 2023-10-08 – 2023-10-11 (×4): 15 mg via ORAL
  Filled 2023-10-08 (×4): qty 1

## 2023-10-08 MED ORDER — HYDROCODONE-ACETAMINOPHEN 5-325 MG PO TABS: 1.0000 | ORAL_TABLET | ORAL | Status: DC | PRN

## 2023-10-08 MED ORDER — AMIODARONE HCL IN DEXTROSE 360-4.14 MG/200ML-% IV SOLN
30.0000 mg/h | INTRAVENOUS | Status: DC
Start: 2023-10-08 — End: 2023-10-10
  Administered 2023-10-08 – 2023-10-10 (×4): 30 mg/h via INTRAVENOUS
  Filled 2023-10-08 (×4): qty 200

## 2023-10-08 MED ORDER — ALBUTEROL SULFATE (2.5 MG/3ML) 0.083% IN NEBU
2.5000 mg | INHALATION_SOLUTION | RESPIRATORY_TRACT | Status: DC | PRN
  Administered 2023-10-09: 2.5 mg via RESPIRATORY_TRACT
  Filled 2023-10-08: qty 3

## 2023-10-08 MED ORDER — ONDANSETRON HCL 4 MG/2ML IJ SOLN
4.0000 mg | Freq: Three times a day (TID) | INTRAMUSCULAR | Status: DC | PRN
  Administered 2023-10-09 – 2023-10-10 (×3): 4 mg via INTRAVENOUS
  Filled 2023-10-08 (×4): qty 2

## 2023-10-08 MED ORDER — VITAMIN B-12 1000 MCG PO TABS
1000.0000 ug | ORAL_TABLET | Freq: Every day | ORAL | Status: DC
  Administered 2023-10-09 – 2023-10-12 (×4): 1000 ug via ORAL
  Filled 2023-10-08: qty 2
  Filled 2023-10-08 (×2): qty 1
  Filled 2023-10-08: qty 2

## 2023-10-08 MED ORDER — SODIUM CHLORIDE 1 G PO TABS
1.0000 g | ORAL_TABLET | Freq: Two times a day (BID) | ORAL | Status: DC
  Administered 2023-10-08 – 2023-10-12 (×8): 1 g via ORAL
  Filled 2023-10-08 (×8): qty 1

## 2023-10-08 MED ORDER — VITAMIN D3 25 MCG (1000 UNIT) PO TABS
1000.0000 [IU] | ORAL_TABLET | Freq: Every day | ORAL | Status: DC
  Administered 2023-10-09 – 2023-10-12 (×4): 1000 [IU] via ORAL
  Filled 2023-10-08 (×8): qty 1

## 2023-10-08 MED ORDER — NITROGLYCERIN 0.4 MG SL SUBL: 0.4000 mg | SUBLINGUAL_TABLET | SUBLINGUAL | Status: DC | PRN

## 2023-10-08 MED ORDER — HYDRALAZINE HCL 20 MG/ML IJ SOLN
2.0000 mg | INTRAMUSCULAR | Status: DC | PRN
  Administered 2023-10-09 – 2023-10-12 (×5): 2 mg via INTRAVENOUS
  Filled 2023-10-08 (×5): qty 1

## 2023-10-08 MED ORDER — AMIODARONE HCL IN DEXTROSE 360-4.14 MG/200ML-% IV SOLN: 30.0000 mg/h | INTRAVENOUS | Status: DC

## 2023-10-08 MED ORDER — LEVOTHYROXINE SODIUM 50 MCG PO TABS: 75.0000 ug | ORAL_TABLET | Freq: Every day | ORAL | Status: DC

## 2023-10-08 MED ORDER — IPRATROPIUM-ALBUTEROL 0.5-2.5 (3) MG/3ML IN SOLN
3.0000 mL | RESPIRATORY_TRACT | Status: DC
  Administered 2023-10-08 – 2023-10-11 (×10): 3 mL via RESPIRATORY_TRACT
  Filled 2023-10-08 (×12): qty 3

## 2023-10-08 MED ORDER — METHOCARBAMOL 500 MG PO TABS
500.0000 mg | ORAL_TABLET | Freq: Three times a day (TID) | ORAL | Status: DC | PRN
  Filled 2023-10-08: qty 1

## 2023-10-08 MED ORDER — LIDOCAINE 5 % EX PTCH
1.0000 | MEDICATED_PATCH | CUTANEOUS | Status: DC
  Administered 2023-10-08 – 2023-10-11 (×3): 1 via TRANSDERMAL
  Filled 2023-10-08 (×2): qty 1
  Filled 2023-10-08 (×3): qty 3

## 2023-10-08 MED ORDER — AMIODARONE LOAD VIA INFUSION: 150.0000 mg | Freq: Once | INTRAVENOUS | Status: DC

## 2023-10-08 MED ORDER — TURMERIC 500 MG PO CAPS: 500.0000 mg | ORAL_CAPSULE | Freq: Every day | ORAL | Status: DC

## 2023-10-08 MED ORDER — LACTULOSE 10 GM/15ML PO SOLN: 30.0000 g | Freq: Two times a day (BID) | ORAL | Status: DC | PRN

## 2023-10-08 MED ORDER — IOHEXOL 350 MG/ML SOLN
75.0000 mL | Freq: Once | INTRAVENOUS | Status: AC | PRN
  Administered 2023-10-08: 75 mL via INTRAVENOUS

## 2023-10-08 NOTE — ED Notes (Signed)
 Pt transported to CT ?

## 2023-10-08 NOTE — Progress Notes (Signed)
 Cardiology Office Note    Date:  10/08/2023   ID:  MICHAELIA BEILFUSS, DOB 1930/03/16, MRN [id number]  PCP:  Enid Baas, MD  Cardiologist:  Yvonne Kendall, MD  Electrophysiologist:  None   Chief Complaint: Follow-up  History of Present Illness:   Kathryn Wise is a 89 y.o. female with history of nonobstructive CAD, HOCM, HFpEF, difficult to control hypertension with left renal artery stenosis status post stenting in 12/2018, carotid artery disease status post left sided stenting in 09/2020, possible small SDH noted in 10/2021, LBBB, HLD, and mitral regurgitation who presents for follow-up of HOCM.   Kathryn Wise was previously followed by Dr. Alvino Chapel in 2017 for cardiac murmur with echo at that time demonstrating an EF of 60 to 65%, normal wall motion, mild focal basal and mild concentric LVH of the septum with mild LVOT gradient, grade 1 diastolic dysfunction, mildly dilated aortic root and ascending aorta, mild mitral regurgitation, normal RV systolic function and PASP.  She was admitted for chest pain in 05/2017 with Lexiscan being abnormal.  She subsequently underwent LHC in 05/2017, which demonstrated nonobstructive CAD involving the LCx and RCA with medical management recommended.  Echo in 05/2017 showed an EF of 55 to 60%, normal wall motion, mild focal basal and mild concentric LVH, near cavity obliteration in systole, grade 1 diastolic dysfunction, very mild aortic stenosis, moderate mitral regurgitation, mildly dilated left atrium, normal RV systolic function, and a PASP of 50 mmHg.  Following this, she was lost to follow-up.   She was admitted to the hospital in 09/2020 with pneumonia and HFpEF.  Echo demonstrated an EF of 55 to 60%, moderate LVH, grade 2 diastolic dysfunction with moderate LVOT obstruction with a peak gradient of 68 mmHg at rest and 74 mmHg with Valsalva, normal RV systolic function and ventricular cavity size, moderately elevated PASP estimated at 46.5 mmHg, moderately  dilated left atrium, mild mitral regurgitation, mild mitral stenosis, moderate mitral annular calcification, and mild to moderate aortic valve sclerosis without evidence of stenosis.  High-sensitivity troponin trended to 976.  She was lost to follow-up.   She was admitted in 09/2021 with hypertensive urgency with noted improvement in BP following 10 mg of amlodipine and 5 mg of IV labetalol.  She was noted to have a mildly elevated high-sensitivity troponin peaking at 36 felt to be related to supply demand ischemia from hypertension.   She was admitted to the hospital in 10/2021 noting left upper extremity numbness that was transient, bilateral lower extremity swelling, and generalized weakness.  She was consulted on by neurosurgery for possible TIA.  MRI of the brain showed a possible 2 mm subdural hematoma overlying the right cerebral convexity.  This was not well visualized on CT imaging.  She was unaware of any fall/trauma.  There was recommended the patient continue aspirin without need for antiepileptic medication given lack of subdural hematoma noted on CT imaging.  Echo, as read by outside group, showed an EF of 50 to 55%, no regional wall motion normalities, mild concentric LVH, grade 1 diastolic dysfunction, normal RV systolic function and ventricular cavity size, myxomatous mitral valve with mild stenosis, and mild aortic insufficiency.  Lower extremity ultrasound was negative for DVT bilaterally.  High-sensitivity troponin 28 with a delta troponin of 29.  BNP 418.   She was seen in hospital follow-up on 11/20/2021 and was without symptoms of angina or decompensation at that time.  She did report an approximate 4-week history of left jaw pain,  and in the setting had multiple teeth extracted along that side without symptomatic improvement.  This discomfort was exacerbated by exertion, chewing, and yawning.  Subsequent Lexiscan MPI on 11/27/2021 was overall low risk without evidence of infarction or  ischemia.  CT attenuated corrected images showed mild coronary artery calcification and aortic atherosclerosis.  Echo on 12/07/2021 demonstrated an EF of 60 to 65%, no regional wall motion abnormalities, moderate LVH with severe basal septal hypertrophy with an LVOT gradient of 39 mmHg at rest and 52 mmHg with Valsalva with one measurement of 127 mmHg, normal RV systolic function and ventricular cavity size, mildly dilated left atrium, moderate mitral valve regurgitation with severe mitral annular calcification, and aortic valve sclerosis without evidence of stenosis.   She was seen in the office on 12/28/2021 and was without symptoms of angina or decompensation.  She did feel somewhat deconditioned as she had not been able to exercise regularly.  She was without symptoms of dizziness, presyncope, or syncope.  Her blood pressure was improved following the addition of Cardizem CD.  We subsequently titrated this to 240 mg daily.  She subsequently reported issues with pain all over, noting that slight "changes" led to a flareup of polymyalgia.  Given this, it was recommended she resume lower dose Cardizem 180 mg daily.  She subsequently discontinued this medication altogether indicating she was "feeling poorly."  She was later diagnosed with a sinus infection.  We recommended she resume Cardizem 180 mg daily due to elevated BP.   She was seen in the office in 01/2022 noting diffuse myalgias and limited mobility while on diltiazem.  She underwent intermittent washing out of this medication with improvement in symptoms while she was off diltiazem.  However, she did continue to take diltiazem with intermittent elevated blood pressure readings.  She requested a prednisone taper for her myalgias.  We transitioned her to low-dose carvedilol 3.125 mg twice daily.  She subsequently notified our office in 02/2022 that carvedilol was working well for her blood pressure, though causing fatigue and weakness leading it to be  discontinued.  She was placed back on amlodipine 10 mg daily, which she previously tolerated without issues.     She was seen in the office in 03/2022 noting an increase in bilateral lower extremity swelling that began after reinitiating amlodipine.  She noticed an improvement in her energy following discontinuation of carvedilol.  She preferred to remain on amlodipine 10 mg daily.  She was started on furosemide 20 mg daily with follow-up labs showing stable renal function.   She was seen by her PCP 08/17/2022 for elevated blood pressure and placed on hydralazine.  While at the hairdresser, later that day, while sitting on the hair dryer, she developed sudden onset of tachypalpitations with associated dizziness and near syncope.  No frank syncope.  Symptoms lasted a few seconds and spontaneously resolved.  She drove herself home.  While at home, she had another episode of tachypalpitations associated near syncope that persisted a little longer prompting her to contact EMS.  She never had chest pain or frank syncope.  She was admitted to [facility] in 07/2022 with atrial tachycardia and near syncope in the setting of HOCM.  High-sensitivity troponin peaked at 305 and was felt to represent demand ischemia in the setting of atrial tachycardia with known HOCM.  BNP 710.  With initiation of bisoprolol there was documented improvement in tachy-palpitation burden.  Patient discharged on bisoprolol 5 mg daily.   She was last seen in the  office in 07/2022 and had tolerated the addition of bisoprolol without issue.  She subsequently contacted our office concerned about asymptomatic heart rates in the 50s bpm.  In this setting, bisoprolol was reduced to 2.5 mg daily.   She was evaluated in the ED on 11/04/2022 with elevated BP readings with a BP of 219/95.  High-sensitivity troponin mildly elevated and flat trending peaking at 44.  She was given 5 mg of IV hydralazine.   She was seen in the office on 11/30/2022 continuing to note  fluctuations in her blood pressure ranging from the 120s to 190s systolic.  She did not believe as needed hydralazine had helped her much and that at times resumed as needed amlodipine 5 mg.  She preferred to transition back to amlodipine on an as needed basis.  She was taking bisoprolol 2.5 mg every other day.   She was admitted to the hospital in 02/2023 with acute left pyelonephritis with E. coli UTI.  High-sensitivity troponin trended to 511, which was felt to be related to supply/demand ischemia in the setting of hypertrophic cardiomyopathy and pyelonephritis.  At discharge, bisoprolol was titrated to 5 mg 2.5 daily, however patient preferred to remain on prior dose of bisoprolol 2.5 mg every other day.   She was seen in the office in 03/2023 and was without angina or cardiac decompensation.  Chronic fatigue was stable.  Blood pressure at home was well-controlled in the 1 teens to 130s.  She was taking bisoprolol 2.5 mg every other day and had not needed any as needed amlodipine.  She was last seen in the office in 06/2023 and remained without symptoms of angina or cardiac decompensation.  She was active at baseline without cardiac limitation.  She was continued on bisoprolol 2.5 mg every other day, preferring to not make changes at that time.  She was diagnosed with acute bronchitis in 08/2023 and was treated with 2 rounds of antibiotics and steroids.  With this diagnosis that she had significant coughing burden.  She was admitted to the hospital from 09/22/2023 through 09/24/2023 with left lower quadrant abdominal pain and was found to have an acute left lower rectus muscle hemorrhage with active intramuscular bleeding and expansion of the muscle and early extension of the hematoma into the space of the rectus.  IR and general surgery were consulted with recommendation for conservative management and pain control as the source of bleeding from a small caliber muscle branch artery that was challenging to  reach and perform embolization.  Initial hemoglobin of 10.6 downtrending to 9.2.  Admission was complicated by syncopal episode felt to be due to orthostatic hypotension with CT head negative for acute intracranial trauma.  In this setting, PTA bisoprolol was held.  She was readmitted on 09/25/2023 through 09/30/2023 with generalized weakness and back pain.  She was found to have a T12 burst fracture with conservative management advised.  Admission was further complicated by progressive hyponatremia, progressive acute blood loss anemia with hemoglobin trending to 7.4, and UTI.  During the admission she required supplemental oxygen and was discharged with this.  She was discharged to inpatient rehab.  She comes in accompanied by her family member today.  Since her hospital discharge both patient and family indicate the patient has not done well.  They report that the patient is requiring increased amounts of supplemental oxygen.  She continues to note significant shortness of breath despite this.  She also notes poor oral intake indicating she had not eaten for 4  days besides half of a [employer] sandwich earlier today.  She reports an hour long episode of chest pain on 3/10.  She was woken up in the middle of the night getting into 3/11 with profuse diaphoresis.  With this episode she also reported profound fatigue when changing her bed close.  She denies any palpitations.  No presyncope or syncope.  She reports that she does not have enough energy to even sit up for an extended timeframe.  She has reported to family that she has considered "giving up."    Labs independently reviewed: 09/2023 - potassium 4.1, BUN 11, serum creatinine 0.8, Hgb 7.4, PLT 194 08/2023 - albumin 2.6, AST/ALT normal 02/2023 - magnesium 2.3, TSH 7.416, free T4 normal 11/2022 - TC 133, TG 81, HDL 52, LDL 64, A1c 5.9  Past Medical History:  Diagnosis Date   Arthritis    Collagen vascular disease (HCC)    Dysphagia,  pharyngoesophageal phase 09/10/2013   Upper GI study with barium swallow was done at  [facility] Mar 2015   No reflux seen  Small irreducible hiatal hernia  Mild changes of presbyeophagus (abnormal contractions of the esophagus that occur with aging) No strictures Normal gastric emptying  Incomplete visualization of stomach fold due to patient's inability turn     GERD (gastroesophageal reflux disease)    Heart murmur    has had years and years   History of shingles Dec 2013   treated with steroids , post op from shoulder surgery   History of squamous cell carcinoma 05/02/2016   right mid lateral pretibial   Hypertension    Hypothyroidism    Major depressive disorder, single episode 11/05/2015   Osteoarthritis of left shoulder 07/14/2012   Pneumonia 10/21/2020   Squamous cell carcinoma of skin 05/12/2016   R mid lat pretibial - other skin cancers treated by Dr. Jarold Motto   Squamous cell carcinoma of skin 08/22/2020   left distal medial popliteal - [facility]    Past Surgical History:  Procedure Laterality Date   ARTHOSCOPIC ROTAOR CUFF REPAIR     LEFT  10+  YEARS     CAROTID PTA/STENT INTERVENTION Left 10/19/2020   Procedure: CAROTID PTA/STENT INTERVENTION;  Surgeon: Renford Dills, MD;  Location: [facility] INVASIVE CV LAB;  Service: Cardiovascular;  Laterality: Left;   CATARACT EXTRACTION W/ INTRAOCULAR LENS IMPLANT     RIGHT EYE   CATARACT EXTRACTION W/PHACO Left 03/28/2016   Procedure: CATARACT EXTRACTION PHACO AND INTRAOCULAR LENS PLACEMENT (IOC);  Surgeon: Lockie Mola, MD;  Location: [facility];  Service: Ophthalmology;  Laterality: Left;  TORIC   FACELIFT     FOOT SURGERY     rt foot   TUMOR REMOVED   JOINT REPLACEMENT Left 06/2012   shoulder   LEFT HEART CATH AND CORONARY ANGIOGRAPHY N/A 06/12/2017   Procedure: LEFT HEART CATH AND CORONARY ANGIOGRAPHY;  Surgeon: Antonieta Iba, MD;  Location: [facility] INVASIVE CV LAB;  Service: Cardiovascular;  Laterality: N/A;   RENAL  ANGIOGRAPHY Left 01/07/2019   Procedure: RENAL ANGIOGRAPHY;  Surgeon: Renford Dills, MD;  Location: [facility] INVASIVE CV LAB;  Service: Cardiovascular;  Laterality: Left;   RENAL ARTERY STENT     SKIN CANCER EXCISION     TOTAL SHOULDER ARTHROPLASTY  07/14/2012   Procedure: TOTAL SHOULDER ARTHROPLASTY;  Surgeon: Eulas Post, MD;  Location: [facility] OR;  Service: Orthopedics;  Laterality: Left;    Current Medications: Current Meds  Medication Sig   albuterol (VENTOLIN HFA) 108 (90 Base)  MCG/ACT inhaler Inhale into the lungs.   amLODipine (NORVASC) 5 MG tablet Take 1 tablet (5 mg total) by mouth as needed (Once daily AS NEEDED for SBP (top blood pressure number) is greater than 160.).   [Paused] aspirin EC 81 MG tablet Take 81 mg by mouth daily.   benzonatate (TESSALON) 200 MG capsule Take 200 mg by mouth 3 (three) times daily as needed for cough.   bisacodyl (DULCOLAX) 5 MG EC tablet Take 1 tablet (5 mg total) by mouth daily as needed for moderate constipation.   bisoprolol (ZEBETA) 5 MG tablet Take 1 tablet (5 mg total) by mouth every other day.   Cholecalciferol (VITAMIN D-3) 1000 units CAPS Take 1,000 Units by mouth daily.   cyanocobalamin (VITAMIN B12) 1000 MCG tablet Take 1,000 mcg by mouth daily.   docusate sodium (COLACE) 100 MG capsule Take 1 capsule (100 mg total) by mouth 2 (two) times daily.   doxycycline (VIBRAMYCIN) 100 MG capsule Take 100 mg by mouth daily.   HYDROcodone-acetaminophen (NORCO/VICODIN) 5-325 MG tablet Take 1-2 tablets by mouth every 4 (four) hours as needed for moderate pain (pain score 4-6).   ipratropium-albuterol (DUONEB) 0.5-2.5 (3) MG/3ML SOLN Inhale into the lungs.   lactulose (CHRONULAC) 10 GM/15ML solution Take 45 mLs (30 g total) by mouth 2 (two) times daily as needed for mild constipation.   levothyroxine (SYNTHROID) 75 MCG tablet Take 75 mcg by mouth daily.   lidocaine (LIDODERM) 5 % Place 1-3 patches onto the skin daily. Remove & Discard patch within 12  hours or as directed by MD   nitroGLYCERIN (NITROSTAT) 0.4 MG SL tablet Place 1 tablet (0.4 mg total) under the tongue every 5 (five) minutes as needed for chest pain.   omeprazole (PRILOSEC) 20 MG capsule TAKE 1 CAPSULE(20 MG) BY MOUTH EVERY MORNING AS NEEDED FOR HEARTBURN   ondansetron (ZOFRAN-ODT) 4 MG disintegrating tablet Take 4 mg by mouth every 8 (eight) hours as needed.   polyethylene glycol (MIRALAX / GLYCOLAX) 17 g packet Take 17 g by mouth daily.   predniSONE (DELTASONE) 20 MG tablet Take 20 mg by mouth 2 (two) times daily.   rosuvastatin (CRESTOR) 10 MG tablet Take 1 tablet (10 mg total) by mouth at bedtime.   temazepam (RESTORIL) 15 MG capsule Take 1 capsule (15 mg total) by mouth at bedtime as needed for sleep.   Turmeric 500 MG CAPS Take 500 mg by mouth daily.   XIIDRA 5 % SOLN Apply 1 drop to eye 2 (two) times daily.    Allergies:   Alendronate, Codeine, Lisinopril, Metoprolol, Oxycodone, and Oxycontin [oxycodone hcl]   Social History   Socioeconomic History   Marital status: Widowed    Spouse name: Not on file   Number of children: Not on file   Years of education: Not on file   Highest education level: Not on file  Occupational History   Not on file  Tobacco Use   Smoking status: Former    Current packs/day: 0.00    Types: Cigarettes    Quit date: 07/30/1972    Years since quitting: 51.2   Smokeless tobacco: Never   Tobacco comments:    smoked for only a few months  Vaping Use   Vaping status: Never Used  Substance and Sexual Activity   Alcohol use: Yes    Comment: OCCAS WINE    Drug use: No   Sexual activity: Never  Other Topics Concern   Not on file  Social History Narrative  Not on file   Social Drivers of Health   Financial Resource Strain: Low Risk  (09/12/2023)   Received from [facility]   Overall Financial Resource Strain (CARDIA)    Difficulty of Paying Living Expenses: Not hard at all  Food Insecurity: No Food Insecurity  (09/25/2023)   Hunger Vital Sign    Worried About Running Out of Food in the Last Year: Never true    Ran Out of Food in the Last Year: Never true  Transportation Needs: No Transportation Needs (09/25/2023)   PRAPARE - Administrator, Civil Service (Medical): No    Lack of Transportation (Non-Medical): No  Physical Activity: Sufficiently Active (04/20/2020)   Exercise Vital Sign    Days of Exercise per Week: 4 days    Minutes of Exercise per Session: 60 min  Stress: No Stress Concern Present (04/20/2020)   [employer] of Occupational Health - Occupational Stress Questionnaire    Feeling of Stress : Not at all  Social Connections: Moderately Isolated (09/25/2023)   Social Connection and Isolation Panel [NHANES]    Frequency of Communication with Friends and Family: More than three times a week    Frequency of Social Gatherings with Friends and Family: Once a week    Attends Religious Services: 1 to 4 times per year    Active Member of [employer] or Organizations: No    Attends Banker Meetings: Never    Marital Status: Widowed     Family History:  The patient's family history includes Breast cancer (age of onset: 46) in her daughter; Cancer in her daughter and son.  ROS:   12-point review of systems is negative unless otherwise noted in the HPI.   EKGs/Labs/Other Studies Reviewed:    Studies reviewed were summarized above. The additional studies were reviewed today:  2D echo 08/05/2023: 1. Left ventricular ejection fraction, by estimation, is 60 to 65%. The  left ventricle has normal function. The left ventricle has no regional  wall motion abnormalities. There is moderate concentric left ventricular  hypertrophy, severe basal septal wall   hypertrophy . LVOT gradient estimated at 99 mm HG with valsalva. Left  ventricular diastolic parameters are consistent with Grade I diastolic  dysfunction (impaired relaxation).   2. Right ventricular systolic function  is normal. The right ventricular  size is normal. There is normal pulmonary artery systolic pressure. The  estimated right ventricular systolic pressure is 32.9 mmHg.   3. Left atrial size was severely dilated.   4. The mitral valve is normal in structure. Mild to moderate mitral valve  regurgitation. No evidence of mitral stenosis. Severe mitral annular  calcification.   5. The aortic valve is calcified. Aortic valve regurgitation is not  visualized. Aortic valve sclerosis/calcification is present, without any  evidence of aortic stenosis.   6. The inferior vena cava is normal in size with greater than 50%  respiratory variability, suggesting right atrial pressure of 3 mmHg.  __________  Limited echo 12/07/2021: 1. Left ventricular ejection fraction, by estimation, is 60 to 65%. The  left ventricle has normal function. The left ventricle has no regional  wall motion abnormalities. There is moderat   2. There is moderate left ventricular hypertrophy with severe basal  septal hypertrophy. LVOT gradient noted, 39 mm Hg at rest, up to 52 mm Hg  with valsalva. One measurement estimating gradient at 127 mm Hg.   3. Right ventricular systolic function is normal. The right ventricular  size  is normal. Tricuspid regurgitation signal is inadequate for assessing  PA pressure.   4. Left atrial size was mildly dilated.   5. The mitral valve is normal in structure. Moderate mitral valve  regurgitation. Severe mitral annular calcification.   6. The aortic valve is normal in structure. Aortic valve regurgitation is  not visualized. Aortic valve sclerosis/calcification is present, without  any evidence of aortic stenosis.   Comparison(s): 11/13/21 echo measured LVOT Vmax as 156 cm/s. __________   Eugenie Birks MPI 11/27/2021:   The study is normal. The study is low risk.   No ST deviation was noted.   LV perfusion is normal. There is no evidence of ischemia. There is no evidence of infarction.   Left  ventricular function is normal. End diastolic cavity size is normal. End systolic cavity size is normal.   Suboptimal study due to GI uptake.   CT attenuation images with mild aortic and coronary calcification. __________   2D echo 11/13/2021:  1. Left ventricular ejection fraction, by estimation, is 50 to 55%. The  left ventricle has low normal function. The left ventricle has no regional  wall motion abnormalities. There is mild concentric left ventricular  hypertrophy. Left ventricular  diastolic parameters are consistent with Grade I diastolic dysfunction  (impaired relaxation).   2. Right ventricular systolic function is normal. The right ventricular  size is normal.   3. The mitral valve is myxomatous. No evidence of mitral valve  regurgitation. Mild mitral stenosis.   4. The aortic valve is normal in structure. Aortic valve regurgitation is  mild. __________   2D echo 06/26/2017: - Left ventricle: The cavity size was normal. Near cavity    obliteration in systole. There was mild focal basal and mild    concentric hypertrophy of the septum. Systolic function was    normal. The estimated ejection fraction was in the range of 55%    to 60%. Wall motion was normal; there were no regional wall    motion abnormalities. Doppler parameters are consistent with    abnormal left ventricular relaxation (grade 1 diastolic    dysfunction).  - Aortic valve: Transvalvular velocity was increased. There was    very mild stenosis.  - Mitral valve: Calcified annulus. There was moderate    regurgitation.  - Left atrium: The atrium was mildly dilated.  - Right ventricle: Systolic function was normal.  - Pulmonary arteries: Systolic pressure was moderately elevated. PA    peak pressure: 50 mm Hg (S). __________  [facility] 06/12/2017: Coronary dominance: Right  Left mainstem:   Large vessel that bifurcates into the LAD and left circumflex, Ostial 30% disease, calcified.  Left anterior descending  (LAD):   Large vessel that extends to the apical region, diagonal branch 2 of moderate size, mild lumenal irregularities  Left circumflex (LCx):  Large vessel with OM branch 2, moderate 50% mid vessel disease, calcified  Right coronary artery (RCA):  Right dominant vessel with PL and PDA, moderate focal disease, eccentric 50 to 60%  Left ventriculography: Left ventricular systolic function is normal/hyperdynamic, LVEF is estimated at 55-65%, there is no significant mitral regurgitation , no significant aortic valve stenosis  Final Conclusions:   Moderate mid LCX and RCA disease, Medical management recommended Normal/hyperdynamic function  Recommendations:  Follow up on echocardiogram Start metoprolol tartrate 25 mg po BID __________   Eugenie Birks MPI 06/11/2017: Abnormal, potentially high risk myocardial perfusion stress test. There is a moderate in size, mild in severity, reversible defect involving the mid inferolateral,  apical lateral, and apical segments consistent with ischemia. There is a small in size, mild in severity, reversible defect involving the mid anterior segment, which may represent subtle ischemia and/or shifting breast attenuation. The left ventricular ejection fraction is normal (64%). Transient ischemic dilation is noted (TID 1.31), which is non-specific but can be seen balanced/multivessel ischemia. __________   2D echo 12/05/2015: - Left ventricle: The cavity size was normal. There was mild focal    basal and mild concentric hypertrophy of the septum, with mild    LVOT gradient. Systolic function was normal. The estimated    ejection fraction was in the range of 60% to 65%. Wall motion was    normal; there were no regional wall motion abnormalities. Doppler    parameters are consistent with abnormal left ventricular    relaxation (grade 1 diastolic dysfunction).  - Aorta: Aortic root with milldy dilated, dimension: 35 mm (ED).    Ascending aorta was mildly  dilated, diameter: 35 mm (S).  - Mitral valve: There was mild regurgitation.  - Left atrium: The atrium was normal in size.  - Right ventricle: Systolic function was normal.  - Pulmonary arteries: Systolic pressure was within the normal    range.  - Inferior vena cava: The vessel was normal in size. The    respirophasic diameter changes were in the normal range (>= 50%),    consistent with normal central venous pressure.   Impressions:   - Murmur likely secondary to aortic valve sclerosis without    significant stenosis, and mild LVOT gradient.   EKG:  EKG is ordered today.  The EKG ordered today demonstrates NSR with occasional PACs, 74 bpm, LBBB  Recent Labs: 03/01/2023: B Natriuretic Peptide 1,726.1 03/02/2023: Magnesium 2.3; TSH 7.416 09/25/2023: ALT 17 10/08/2023: BUN 13; Creatinine, Ser 0.90; Hemoglobin 8.8; Platelets 210; Potassium 4.0; Sodium 128  Recent Lipid Panel    Component Value Date/Time   CHOL 115 11/14/2021 0538   TRIG 91 11/14/2021 0538   HDL 42 11/14/2021 0538   CHOLHDL 2.7 11/14/2021 0538   VLDL 18 11/14/2021 0538   LDLCALC 55 11/14/2021 0538   LDLDIRECT 126.0 09/19/2016 1411    PHYSICAL EXAM:    VS:  BP 106/64 (BP Location: Left Arm, Patient Position: Sitting, Cuff Size: Normal)   Pulse 74   Ht 5\' 5"  (1.651 m)   Wt 152 lb 6.4 oz (69.1 kg)   SpO2 97%   BMI 25.36 kg/m   BMI: Body mass index is 25.36 kg/m.  Physical Exam Vitals reviewed.  Constitutional:      General: She is awake.     Appearance: She is well-developed. She is ill-appearing.     Comments: Pale appearing.  HENT:     Head: Normocephalic and atraumatic.  Eyes:     General:        Right eye: No discharge.        Left eye: No discharge.     Comments: Pale conjunctiva bilaterally.  Neck:     Vascular: No JVD.  Cardiovascular:     Rate and Rhythm: Normal rate and regular rhythm. Occasional Extrasystoles are present.    Heart sounds: S1 normal and S2 normal. Heart sounds not distant.  No midsystolic click and no opening snap. Murmur heard.     Systolic murmur is present with a grade of 2/6.     No friction rub.  Pulmonary:     Effort: Pulmonary effort is normal. No respiratory distress.  Breath sounds: Decreased breath sounds, wheezing and rhonchi present. No rales.     Comments: Diminished and coarse breath sounds bilaterally with diffuse rhonchi and expiratory wheezing. Chest:     Chest wall: No tenderness.  Abdominal:     General: There is no distension.     Palpations: Abdomen is soft.     Tenderness: There is no abdominal tenderness.  Musculoskeletal:     Cervical back: Normal range of motion.     Comments: Trivial bilateral ankle edema.  Skin:    General: Skin is warm and dry.     Nails: There is no clubbing.  Neurological:     Mental Status: She is alert and oriented to person, place, and time.  Psychiatric:        Speech: Speech normal.        Behavior: Behavior normal.        Thought Content: Thought content normal.        Judgment: Judgment normal.     Wt Readings from Last 3 Encounters:  10/08/23 152 lb 6.4 oz (69.1 kg)  09/25/23 150 lb (68 kg)  09/22/23 150 lb (68 kg)     ASSESSMENT & PLAN:   Acute hypoxic respiratory failure with progressive dyspnea, fatigue, chest pain, and laboratory abnormalities: Uncertain underlying etiology of her continued significant decline at this time.  Concern for possible progression of pneumonia based on progressive dyspnea and abnormal lung exam as well as reported fever several days prior.  Cannot exclude progressive anemia with recent acute blood loss anemia in the setting of rectus hematoma which would potentially exacerbate underlying HOCM.  When compared to how she has presented at her prior follow-ups, there is a significant change in her functional status today as she presents in a wheelchair and appears acutely ill.  I believe to best expedite her medical care moving forward she should be transitioned to  the ED for further evaluation and likely hospital admission.  HOCM with LBBB: No symptoms of presyncope or syncope at this time.  HOCM would be exacerbated by progressive anemia.  Will need to consider reinitiating bisoprolol moving forward when she is hemodynamically stable.  She has previously indicated first-degree relatives have been screened. Given advanced age and comorbid conditions, she has preferred to follow a conservative approach with declination of referral to HOCM clinic, cardiac MRI, or referral to EP for consideration of ICD.   Atrial tachycardia: Bisoprolol on hold for now.  Renovascular labile hypertension: Blood pressure soft, though stable.  Antihypertensive therapy currently on hold.  Mitral valve regurgitation: Mild to moderate mitral regurgitation on echo in 07/2023.  This was not discussed at today's visit given the acuity of the above.  Renal artery stenosis/carotid artery stenosis: Status post right renal artery stenting with ultrasound from 04/2023 showed 1 to 59% bilateral renal artery stenosis.  Carotid artery ultrasound from 04/2023 showed 1 to 39% carotid artery stenosis bilaterally.  Followed by vascular surgery.     Disposition: F/u with Dr. Okey Dupre or an APP pending ED/possible hospital course.   Medication Adjustments/Labs and Tests Ordered: Current medicines are reviewed at length with the patient today.  Concerns regarding medicines are outlined above. Medication changes, Labs and Tests ordered today are summarized above and listed in the Patient Instructions accessible in Encounters.   Signed, Eula Listen, PA-C 10/08/2023 5:18 PM     [facility] [place] [address] [place], [place] [address] [phone]

## 2023-10-08 NOTE — ED Triage Notes (Addendum)
 Pt to ED via POV from [facility] in wheelchair. Pt was at cardiologist and PA reported lungs were "junky" and concerned for infection. PA reported pt is pale and confused. Pt is on 2L [place] at baseline. Pt reports SOB. Pt A&Ox4 to orientation questions on arrival. Family reports decreased appetite and N/V/D x2-3 days and fever of 102.   PA also concerned about pt waking up in the middle of the night diaphoretic that she may have had a cardiac event.

## 2023-10-08 NOTE — H&P (Signed)
 History and Physical    Kathryn Wise GNF:[id number] DOB: 05/08/1930 DOA: 10/08/2023  Referring MD/NP/PA:   PCP: Enid Baas, MD   Patient coming from:  The patient is coming from rehab .     Chief Complaint: SOB  HPI: Kathryn Wise is a 89 y.o. female with medical history significant of HOCM, dCHF, HTN, HLD, CAD, hypothyroidism, depression, anemia, A-fib not on anticoagulants, LBBB, subdural hematoma, dysphagia, carotid artery stenosis, renal artery stenosis (s/p of renal artery stent placement), skin cancer, hyponatremia, who presents with shortness of breath.  Patient was recently hospitalized from 2/6 - 3/3 due to T12 burst fracture, UTI and rectus sheath hematoma.  Patient is currently doing rehab. Per her granddaughter at the bedside, patient has not been feeling well in the past several days, with generalized weakness, poor appetite, decreased oral intake. Pt has cough, wheezing and SOB which has been progressively worsening. Pt also has chest pain which is located in the front chest, sharp, moderate to moderate, nonradiating, pleuritic, aggravated that deep breath.  No fever or chills.  Patient states that she had nausea and few episodes of nonbilious nonbloody vomiting and few episodes of loose stool bowel movement yesterday.  Her vomiting and diarrhea have resolved today.  Of note, patient is taking Dulcolax, MiraLAX and lactulose.  No abdominal pain.  No symptoms of UTI. Patient was started on prednisone and doxycycline on 3/10, without improvement. Per report, pt has been lethargic, but when I saw patient in ED, she is alert oriented x 3.  Her mental status is close to baseline.  She moves all extremities normally.  She answered all questions appropriately.  Data reviewed independently and ED Course: pt was found to have positive D-dimer 4.34, positive PCR for flu A, BNP 763, WBC 5.5, GFR> 60, sodium 128, negative urinalysis, troponin 76 --> 73, temperature normal, blood  pressure 157/68, heart rate 74, RR 17, oxygen saturation 100% on room air.  Chest x-ray showed pulmonary edema.  Patient is admitted to telemetry bed as inpatient.    EKG: I have personally reviewed.  Sinus rhythm, QTc 472, old left bundle blockade, frequent PAC   Review of Systems:   General: no fevers, chills, no body weight gain, has poor appetite, has fatigue HEENT: no blurry vision, hearing changes or sore throat Respiratory: has dyspnea, coughing, wheezing CV: has chest pain, no palpitations GI: had nausea, vomiting, diarrhea, no constipation, abdominal pain,  GU: no dysuria, burning on urination, increased urinary frequency, hematuria  Ext: has leg edema Neuro: no unilateral weakness, numbness, or tingling, no vision change or hearing loss Skin: no rash, no skin tear. MSK: No muscle spasm, no deformity, no limitation of range of movement in spin Heme: No easy bruising.  Travel history: No recent long distant travel.   Allergy:  Allergies  Allergen Reactions   Alendronate Swelling    And nausea     Codeine    Lisinopril     Spike in BP   Metoprolol Other (See Comments)    Lethargy - PT CURRENTLY TAKING    Oxycodone     Other reaction(s): Other (See Comments) Altered mental status   Oxycontin [Oxycodone Hcl]     Altered mental status    Past Medical History:  Diagnosis Date   Arthritis    Collagen vascular disease (HCC)    Dysphagia, pharyngoesophageal phase 09/10/2013   Upper GI study with barium swallow was done at  [facility] Mar 2015   No reflux seen  Small irreducible hiatal hernia  Mild changes of presbyeophagus (abnormal contractions of the esophagus that occur with aging) No strictures Normal gastric emptying  Incomplete visualization of stomach fold due to patient's inability turn     GERD (gastroesophageal reflux disease)    Heart murmur    has had years and years   History of shingles Dec 2013   treated with steroids , post op from shoulder surgery    History of squamous cell carcinoma 05/02/2016   right mid lateral pretibial   Hypertension    Hypothyroidism    Major depressive disorder, single episode 11/05/2015   Osteoarthritis of left shoulder 07/14/2012   Pneumonia 10/21/2020   Squamous cell carcinoma of skin 05/12/2016   R mid lat pretibial - other skin cancers treated by Dr. Jarold Motto   Squamous cell carcinoma of skin 08/22/2020   left distal medial popliteal - [facility]    Past Surgical History:  Procedure Laterality Date   ARTHOSCOPIC ROTAOR CUFF REPAIR     LEFT  10+  YEARS     CAROTID PTA/STENT INTERVENTION Left 10/19/2020   Procedure: CAROTID PTA/STENT INTERVENTION;  Surgeon: Renford Dills, MD;  Location: [facility] IN[facility]IVE [facility];  Service: Cardiovascular;  Laterality: Left;   CATARACT EXTRACTION W/ INTRAOCULAR LENS IMPLANT     RIGHT EYE   CATARACT EXTRACTION W/PHACO Left 03/28/2016   Procedure: CATARACT EXTRACTION PHACO AND INTRAOCULAR LENS PLACEMENT (IOC);  Surgeon: Lockie Mola, MD;  Location: [facility];  Service: Ophthalmology;  Laterality: Left;  TORIC   FACELIFT     FOOT SURGERY     rt foot   TUMOR REMOVED   JOINT REPLACEMENT Left 06/2012   shoulder   LEFT HEART CATH AND CORONARY ANGIOGRAPHY N/A 06/12/2017   Procedure: LEFT HEART CATH AND CORONARY ANGIOGRAPHY;  Surgeon: Antonieta Iba, MD;  Location: [facility] INV[facility] CV [facility];  Service: Cardiovascular;  Laterality: N/A;   RENAL ANGIOGRAPHY Left 01/07/2019   Procedure: RENAL ANGIOGRAPHY;  Surgeon: Renford Dills, MD;  Location: [facility];  Service: Cardiovascular;  Laterality: Left;   RENAL ARTERY STENT     SKIN CANCER EXCISION     TOTAL SHOULDER ARTHROPLASTY  07/14/2012   Procedure: TOTAL SHOULDER ARTHROPLASTY;  Surgeon: Eulas Post, MD;  Location: [facility] OR;  Service: Orthopedics;  Laterality: Left;    Social History:  reports that she quit smoking about 51 years ago. Her smoking use included cigarettes. She has never used smokeless  tobacco. She reports current alcohol use. She reports that she does not use drugs.  Family History:  Family History  Problem Relation Age of Onset   Cancer Daughter        Breast   Breast cancer Daughter 62   Cancer Son      Prior to Admission medications   Medication Sig Start Date End Date Taking? Authorizing Provider  albuterol (VENTOLIN HFA) 108 (90 Base) MCG/ACT inhaler Inhale into the lungs. 09/12/23   [provider]  amLODipine (NORVASC) 5 MG tablet Take 1 tablet (5 mg total) by mouth as needed (Once daily AS NEEDED for SBP (top blood pressure number) is greater than 160.). 11/30/22   Sondra Barges, PA-C  aspirin EC 81 MG tablet Take 81 mg by mouth daily.    [provider]  benzonatate (TESSALON) 200 MG capsule Take 200 mg by mouth 3 (three) times daily as needed for cough. 09/12/23   [provider]  bisacodyl (DULCOLAX) 5 MG EC tablet Take 1  tablet (5 mg total) by mouth daily as needed for moderate constipation. 09/30/23   Jonah Blue, MD  bisoprolol (ZEBETA) 5 MG tablet Take 1 tablet (5 mg total) by mouth every other day. 08/05/23 11/03/23  Sondra Barges, PA-C  Cholecalciferol (VITAMIN D-3) 1000 units CAPS Take 1,000 Units by mouth daily.    [provider]  cyanocobalamin (VITAMIN B12) 1000 MCG tablet Take 1,000 mcg by mouth daily.    [provider]  docusate sodium (COLACE) 100 MG capsule Take 1 capsule (100 mg total) by mouth 2 (two) times daily. 09/30/23   Jonah Blue, MD  doxycycline (VIBRAMYCIN) 100 MG capsule Take 100 mg by mouth daily. 10/07/23   [provider]  HYDROcodone-acetaminophen (NORCO/VICODIN) 5-325 MG tablet Take 1-2 tablets by mouth every 4 (four) hours as needed for moderate pain (pain score 4-6). 09/30/23   Jonah Blue, MD  ipratropium-albuterol (DUONEB) 0.5-2.5 (3) MG/3ML SOLN Inhale into the lungs. 10/05/23   [provider]  lactulose (CHRONULAC) 10 GM/15ML solution Take 45 mLs (30 g total) by  mouth 2 (two) times daily as needed for mild constipation. 09/30/23   Jonah Blue, MD  levothyroxine (SYNTHROID) 75 MCG tablet Take 75 mcg by mouth daily. 04/10/23   [provider]  lidocaine (LIDODERM) 5 % Place 1-3 patches onto the skin daily. Remove & Discard patch within 12 hours or as directed by MD 10/01/23   Jonah Blue, MD  nitroGLYCERIN (NITROSTAT) 0.4 MG SL tablet Place 1 tablet (0.4 mg total) under the tongue every 5 (five) minutes as needed for chest pain. 08/18/22   Willeen Niece, MD  omeprazole (PRILOSEC) 20 MG capsule TAKE 1 CAPSULE(20 MG) BY MOUTH EVERY MORNING AS NEEDED FOR HEARTBURN 06/13/22   Sherlene Shams, MD  ondansetron (ZOFRAN-ODT) 4 MG disintegrating tablet Take 4 mg by mouth every 8 (eight) hours as needed. 10/07/23   [provider]  polyethylene glycol (MIRALAX / GLYCOLAX) 17 g packet Take 17 g by mouth daily. 10/01/23   Jonah Blue, MD  predniSONE (DELTASONE) 20 MG tablet Take 20 mg by mouth 2 (two) times daily. 10/07/23   [provider]  rosuvastatin (CRESTOR) 10 MG tablet Take 1 tablet (10 mg total) by mouth at bedtime. 03/12/22   Sherlene Shams, MD  temazepam (RESTORIL) 15 MG capsule Take 1 capsule (15 mg total) by mouth at bedtime as needed for sleep. 09/30/23   Jonah Blue, MD  Turmeric 500 MG CAPS Take 500 mg by mouth daily.    [provider]  XIIDRA 5 % SOLN Apply 1 drop to eye 2 (two) times daily. 06/17/23   [provider]    Physical Exam: Vitals:   10/08/23 1535  BP: (!) 157/68  Pulse: 62  Resp: 17  Temp: 98.6 F (37 C)  TempSrc: Oral  SpO2: 100%   General: Not in acute distress HEENT:       Eyes: PERRL, EOMI, no jaundice       ENT: No discharge from the ears and nose, no pharynx injection, no tonsillar enlargement.        Neck: No JVD, no bruit, no mass felt. Heme: No neck lymph node enlargement. Cardiac: S1/S2, frequent premature beat, No gallops or rubs. Respiratory: has wheezing  bilaterally GI: Soft, nondistended, nontender, no rebound pain, no organomegaly, BS present. GU: No hematuria Ext: has trace leg edema bilaterally. 1+DP/PT pulse bilaterally. Musculoskeletal: No joint deformities, No joint redness or warmth, no limitation of ROM in spin. Skin:  No rashes.  Neuro: Alert, oriented X3, cranial nerves II-XII grossly intact, moves all extremities normally.  Psych: Patient is not psychotic, no suicidal or hemocidal ideation.  Labs on Admission: I have personally reviewed following labs and imaging studies  CBC: Recent Labs  Lab 10/08/23 1517  WBC 5.5  HGB 8.8*  HCT 28.6*  MCV 88.0  PLT 210   Basic Metabolic Panel: Recent Labs  Lab 10/08/23 1517 10/08/23 1716  NA 128* 129*  K 4.0 4.5  CL 94* 93*  CO2 27 26  GLUCOSE 151* 128*  BUN 13 13  CREATININE 0.90 0.91  CALCIUM 8.2* 8.4*  MG  --  2.3  PHOS  --  2.8   GFR: Estimated Creatinine Clearance: 39.3 mL/min (by C-G formula based on SCr of 0.91 mg/dL). Liver Function Tests: No results for input(s): "AST", "ALT", "ALKPHOS", "BILITOT", "PROT", "ALBUMIN" in the last 168 hours. No results for input(s): "LIPASE", "AMYLASE" in the last 168 hours. No results for input(s): "AMMONIA" in the last 168 hours. Coagulation Profile: Recent Labs  Lab 10/08/23 1716  INR 1.0   Cardiac Enzymes: No results for input(s): "CKTOTAL", "CKMB", "CKMBINDEX", "TROPONINI" in the last 168 hours. BNP (last 3 results) No results for input(s): "PROBNP" in the last 8760 hours. HbA1C: No results for input(s): "HGBA1C" in the last 72 hours. CBG: No results for input(s): "GLUCAP" in the last 168 hours. Lipid Profile: No results for input(s): "CHOL", "HDL", "LDLCALC", "TRIG", "CHOLHDL", "LDLDIRECT" in the last 72 hours. Thyroid Function Tests: No results for input(s): "TSH", "T4TOTAL", "FREET4", "T3FREE", "THYROIDAB" in the last 72 hours. Anemia Panel: No results for input(s): "VITAMINB12", "FOLATE", "FERRITIN", "TIBC",  "IRON", "RETICCTPCT" in the last 72 hours. Urine analysis:    Component Value Date/Time   COLORURINE STRAW (A) 10/08/2023 1602   APPEARANCEUR CLEAR (A) 10/08/2023 1602   LABSPEC 1.005 10/08/2023 1602   PHURINE 6.0 10/08/2023 1602   GLUCOSEU NEGATIVE 10/08/2023 1602   GLUCOSEU NEGATIVE 05/04/2020 1458   HGBUR SMALL (A) 10/08/2023 1602   BILIRUBINUR NEGATIVE 10/08/2023 1602   KETONESUR NEGATIVE 10/08/2023 1602   PROTEINUR NEGATIVE 10/08/2023 1602   UROBILINOGEN 0.2 05/04/2020 1458   NITRITE NEGATIVE 10/08/2023 1602   LEUKOCYTESUR NEGATIVE 10/08/2023 1602   Sepsis Labs: @LABRCNTIP (procalcitonin:4,lacticidven:4) ) Recent Results (from the past 240 hours)  Resp panel by RT-PCR (RSV, Flu A&B, Covid) Anterior Nasal Swab     Status: Abnormal   Collection Time: 10/08/23  5:16 PM   Specimen: Anterior Nasal Swab  Result Value Ref Range Status   SARS Coronavirus 2 by RT PCR NEGATIVE NEGATIVE Final    Comment: (NOTE) SARS-CoV-2 target nucleic acids are NOT DETECTED.  The SARS-CoV-2 RNA is generally detectable in upper respiratory specimens during the acute phase of infection. The lowest concentration of SARS-CoV-2 viral copies this assay can detect is 138 copies/mL. A negative result does not preclude SARS-Cov-2 infection and should not be used as the sole basis for treatment or other patient management decisions. A negative result may occur with  improper specimen collection/handling, submission of specimen other than nasopharyngeal swab, presence of viral mutation(s) within the areas targeted by this assay, and inadequate number of viral copies(<138 copies/mL). A negative result must be combined with clinical observations, patient history, and epidemiological information. The expected result is Negative.  Fact Sheet for Patients:  BloggerCourse.com  Fact Sheet for Healthcare Providers:  SeriousBroker.it  This test is no t yet  approved or cleared by the [place] and  has been authorized  for detection and/or diagnosis of SARS-CoV-2 by FDA under an Emergency Use Authorization (EUA). This EUA will remain  in effect (meaning this test can be used) for the duration of the COVID-19 declaration under Section 564(b)(1) of the Act, 21 U.S.C.section 360bbb-3(b)(1), unless the authorization is terminated  or revoked sooner.       Influenza A by PCR POSITIVE (A) NEGATIVE Final   Influenza B by PCR NEGATIVE NEGATIVE Final    Comment: (NOTE) The Xpert Xpress SARS-CoV-2/FLU/RSV plus assay is intended as an aid in the diagnosis of influenza from Nasopharyngeal swab specimens and should not be used as a sole basis for treatment. Nasal washings and aspirates are unacceptable for Xpert Xpress SARS-CoV-2/FLU/RSV testing.  Fact Sheet for Patients: BloggerCourse.com  Fact Sheet for Healthcare Providers: SeriousBroker.it  This test is not yet approved or cleared by the [place] FDA and has been authorized for detection and/or diagnosis of SARS-CoV-2 by FDA under an Emergency Use Authorization (EUA). This EUA will remain in effect (meaning this test can be used) for the duration of the COVID-19 declaration under Section 564(b)(1) of the Act, 21 U.S.C. section 360bbb-3(b)(1), unless the authorization is terminated or revoked.     Resp Syncytial Virus by PCR NEGATIVE NEGATIVE Final    Comment: (NOTE) Fact Sheet for Patients: BloggerCourse.com  Fact Sheet for Healthcare Providers: SeriousBroker.it  This test is not yet approved or cleared by the [place] FDA and has been authorized for detection and/or diagnosis of SARS-CoV-2 by FDA under an Emergency Use Authorization (EUA). This EUA will remain in effect (meaning this test can be used) for the duration of the COVID-19 declaration under Section 564(b)(1)  of the Act, 21 U.S.C. section 360bbb-3(b)(1), unless the authorization is terminated or revoked.  Performed at [facility], [address], [place], [place] [address]      Radiological Exams on Admission:   Assessment/Plan Principal Problem:   Influenza A Active Problems:   Acute bronchitis   Chronic diastolic CHF (congestive heart failure) (HCC)   CAD (coronary atherosclerotic disease)   Myocardial injury   Hyponatremia   Hyperlipidemia LDL goal <130   Labile hypertension   Hypothyroidism   HOCM (hypertrophic obstructive cardiomyopathy) (HCC)   T12 burst fracture (HCC)   Rectus sheath hematoma   Normocytic anemia   Positive D dimer   Assessment and Plan:   Acute bronchitis due to influenza A infection: Patient has a cough, SOB, wheezing on auscultation, indicating bronchitis.  No oxygen desaturation.  No fever or leukocytosis.  Clinically not septic.  -will admit patient to telemetry bed  -Bronchodilators and prn Mucinex -Solu-Medrol 40 mg IV bid -Incentive spirometry -As needed nasal cannula oxygen  Chronic diastolic CHF (congestive heart failure) [facility]): Patient has trace leg edema, but has elevated BNP 763, chest x-ray showed pulmonary edema, indicating fluid overload. -Patient received 40 mg Lasix in ED. Will not give more Lasix to avoid overdiuresis due to history of HOCM, also due to hyponatremia -Reevaluate patient volume status in the morning to decide further diuresis.  CAD (coronary atherosclerotic disease) and myocardial injury: trop  76 --> 73.  Likely due to demand ischemia.  Patient has positive D-dimer and pleuritic chest pain, need to rule out PE. -Patient was given 324 mg of aspirin in ED.  Will hold further aspirin due to recent history of rectus sheath hematoma -Crestor -As needed nitroglycerin, Norco -Follow-up CTA to rule out PE  Hyponatremia: Sodium 128 -Fluid restriction -Sodium chloride 1 g twice daily -  f/u BMP  q8h  Hyperlipidemia LDL goal <130 -Crestor  Labile hypertension: Blood pressure 157/68 -Hold amlodipine -Continue Zebeta -IV hydralazine as needed  Hypothyroidism -Synthroid  HOCM (hypertrophic obstructive cardiomyopathy) (HCC) -Continue Zebeta -Avoid overdiuresis  Recent T12 burst fracture (HCC) -As needed Robaxin and Norco  Recent rectus sheath hematoma -Hold aspirin  Normocytic anemia: Hemoglobin stable.  Her hemoglobin was 7.4 on 01/30/2024.  Today hemoglobin 8.8 -Follow-up CBC  Positive D dimer -Follow-up lower extremity venous Doppler to rule out DVT -Follow-up CTA to rule out PE as above   Addendum: pt initially has sinus rhythm with frequent PAC, later on developed intermittent wide-complex tachycardia, with heart rate up to 140, then down to 80s, tachycardia is not sustained. -Will change bed to PCU -If heart rate is persistently uncontrolled, may start amiodarone drip.      DVT ppx: SCD  Code Status: DNR per pt and her granddaughter   Family Communication:     Yes, patient's granddaughter    Disposition Plan:  rehab  Consults called:  none  Admission status and Level of care: Telemetry Cardiac:  as inpt        Dispo: The patient is from: SNF              Anticipated d/c is to: SNF              Anticipated d/c date is: 2 days              Patient currently is not medically stable to d/c.    Severity of Illness:  The appropriate patient status for this patient is INPATIENT. Inpatient status is judged to be reasonable and necessary in order to provide the required intensity of service to ensure the patient's safety. The patient's presenting symptoms, physical exam findings, and initial radiographic and laboratory data in the context of their chronic comorbidities is felt to place them at high risk for further clinical deterioration. Furthermore, it is not anticipated that the patient will be medically stable for discharge from the hospital within 2  midnights of admission.   * I certify that at the point of admission it is my clinical judgment that the patient will require inpatient hospital care spanning beyond 2 midnights from the point of admission due to high intensity of service, high risk for further deterioration and high frequency of surveillance required.*       Date of Service 10/08/2023    Lorretta Harp [facility]   If 7PM-7AM, please contact night-coverage [email or web address] 10/08/2023, 8:56 PM

## 2023-10-08 NOTE — ED Provider Notes (Addendum)
 [facility] Provider Note    Event Date/Time   First MD Initiated Contact with Patient 10/08/23 1700     (approximate)   History   Shortness of Breath   HPI  Kathryn Wise is a 89 y.o. female  nonobstructive CAD, HOCM, HFpEF, difficult to control hypertension with left renal artery stenosis status post stenting in 12/2018, carotid artery disease status post left sided stenting in 09/2020, possible small SDH noted in 10/2021, LBBB, HLD, and mitral regurgitation who presents for follow-up of HOCM.  Patient was sent over from cardiology clinic due to concerns for patient acting more confused, fatigue and increasing shortness of breath.  Patient been feeling ill since Saturday with some intermittent fevers.  The fevers have gotten better but patient is currently on prednisone, antibiotics.  She continues to have increasing shortness of breath and she is needed oxygen for the past week which she typically does not.  She had an episode last night where she woke up drenched in sweat and had a little bit of chest pain.  The PA who saw her was worried she could have a heart issue.  She denies any new abdominal pain.  She does report some increased difficulties with urination and feeling like she is getting all of her urine out and was recently treated for UTI a week ago.  Physical Exam   Triage Vital Signs: ED Triage Vitals  Encounter Vitals Group     BP 10/08/23 1535 (!) 157/68     Systolic BP Percentile --      Diastolic BP Percentile --      Pulse Rate 10/08/23 1535 62     Resp 10/08/23 1535 17     Temp 10/08/23 1535 98.6 F (37 C)     Temp Source 10/08/23 1535 Oral     SpO2 10/08/23 1535 100 %     Weight --      Height --      Head Circumference --      Peak Flow --      Pain Score 10/08/23 1541 0     Pain Loc --      Pain Education --      Exclude from Growth Chart --     Most recent vital signs: Vitals:   10/08/23 1535  BP: (!) 157/68  Pulse: 62   Resp: 17  Temp: 98.6 F (37 C)  SpO2: 100%     General: Awake, no distress.  CV:  Good peripheral perfusion.  Resp:  Normal effort.  Diffuse wheezing noted bilaterally Abd:  No distention.  Similar tenderness from when she was seen here previously per family. Other:  Edema noted bilaterally Patient is alert and oriented x 3.  Moving all extremities well.  No obvious cranial nerve deficits  ED Results / Procedures / Treatments   Labs (all labs ordered are listed, but only abnormal results are displayed) Labs Reviewed  BASIC METABOLIC PANEL - Abnormal; Notable for the following components:      Result Value   Sodium 128 (*)    Chloride 94 (*)    Glucose, Bld 151 (*)    Calcium 8.2 (*)    All other components within normal limits  CBC - Abnormal; Notable for the following components:   RBC 3.25 (*)    Hemoglobin 8.8 (*)    HCT 28.6 (*)    RDW 18.3 (*)    All other components within normal limits  TROPONIN I (  HIGH SENSITIVITY) - Abnormal; Notable for the following components:   Troponin I (High Sensitivity) 76 (*)    All other components within normal limits  RESP PANEL BY RT-PCR (RSV, FLU A&B, COVID)  [id number]     EKG  My interpretation of EKG:  Normal sinus rate of 67 without any ST elevation, T wave versions of inferior lateral leads, left bundle branch block.  Reviewed prior EKG and similar  RADIOLOGY I have reviewed the xray personally and interpreted and patient appears to have a new pleural effusion, pulmonary edema   PROCEDURES:  Critical Care performed: No  .1-3 Lead EKG Interpretation  Performed by: Concha Se, MD Authorized by: Concha Se, MD     Interpretation: normal     ECG rate:  60   ECG rate assessment: normal     Rhythm: sinus rhythm     Ectopy: none     Conduction: normal      MEDICATIONS ORDERED IN ED: Medications  furosemide (LASIX) injection 40 mg (has no administration in time range)     IMPRESSION / MDM / ASSESSMENT AND  PLAN / ED COURSE  I reviewed the triage vital signs and the nursing notes.   Patient's presentation is most consistent with acute presentation with potential threat to life or bodily function.   Patient comes in with concerns for increasing shortness of breath.  Patient had negative COVID, flu testing will add that on.  Chest x-ray looks concerning for new pulmonary edema patient reports being more hypertensive than normal.  Given patient's age, weakness and new oxygen requirement over the past 8 days will discuss the hospital team for admission.  Given her recurrent admissions I will also get D-dimer to evaluate for PE to see if she will need PE imaging while inpatient.  She also has some diffuse wheezing but no history of COPD or asthma.  Will trial a DuoNeb.  Patient already on steroids.  Patient's troponin is elevated and they will need to have continued cardiac monitoring and trending this out.  Will give her a dose of aspirin.  At this time she is acting her normal self that was more just like fatigue.  Do not see any cranial nerve deficits and they deny any fall suggesting any CT imaging of her head.  D-dimer is significantly elevated  CBC shows stable hemoglobin.  Troponin slightly elevated  Will get CT PE.  Given his concern for worsening shortness of breath will admit for either diuresis, pulmonary embolism. CT PE pending   The patient is on the cardiac monitor to evaluate for evidence of arrhythmia and/or significant heart rate changes.      FINAL CLINICAL IMPRESSION(S) / ED DIAGNOSES   Final diagnoses:  Acute on chronic congestive heart failure, unspecified heart failure type (HCC)     Rx / DC Orders   ED Discharge Orders     None        Note:  This document was prepared using Dragon voice recognition software and may include unintentional dictation errors.   Concha Se, MD 10/08/23 1720    Concha Se, MD 10/08/23 Zollie Pee

## 2023-10-08 NOTE — Patient Instructions (Signed)
 Medication Instructions:  None ordered at this time  *If you need a refill on your cardiac medications before your next appointment, please call your pharmacy*   Lab Work: None ordered at this time  If you have labs (blood work) drawn today and your tests are completely normal, you will receive your results only by: MyChart Message (if you have MyChart) OR A paper copy in the mail If you have any lab test that is abnormal or we need to change your treatment, we will call you to review the results.   Testing/Procedures: Pt going to ED with Danelle Earthly RN   Follow-Up: At [facility], you and your health needs are our priority.  As part of our continuing mission to provide you with exceptional heart care, we have created designated Provider Care Teams.  These Care Teams include your primary Cardiologist (physician) and Advanced Practice Providers (APPs -  Physician Assistants and Nurse Practitioners) who all work together to provide you with the care you need, when you need it.  We recommend signing up for the patient portal called "MyChart".  Sign up information is provided on this After Visit Summary.  MyChart is used to connect with patients for Virtual Visits (Telemedicine).  Patients are able to view lab/test results, encounter notes, upcoming appointments, etc.  Non-urgent messages can be sent to your provider as well.   To learn more about what you can do with MyChart, go to [email or web address].    Your next appointment:   call  Provider:   You may see Yvonne Kendall, MD or one of the following Advanced Practice Providers on your designated Care Team:   Nicolasa Ducking, NP Eula Listen, PA-C Cadence Fransico Michael, PA-C Charlsie Quest, NP Carlos Levering, NP

## 2023-10-09 DIAGNOSIS — J101 Influenza due to other identified influenza virus with other respiratory manifestations: Secondary | ICD-10-CM | POA: Diagnosis not present

## 2023-10-09 LAB — BASIC METABOLIC PANEL
Anion gap: 8 (ref 5–15)
Anion gap: 11 (ref 5–15)
BUN: 13 mg/dL (ref 8–23)
BUN: 12 mg/dL (ref 8–23)
CO2: 29 mmol/L (ref 22–32)
CO2: 26 mmol/L (ref 22–32)
Calcium: 8 mg/dL — ABNORMAL LOW (ref 8.9–10.3)
Calcium: 7.8 mg/dL — ABNORMAL LOW (ref 8.9–10.3)
Chloride: 93 mmol/L — ABNORMAL LOW (ref 98–111)
Chloride: 96 mmol/L — ABNORMAL LOW (ref 98–111)
Creatinine, Ser: 0.84 mg/dL (ref 0.44–1.00)
Creatinine, Ser: 0.84 mg/dL (ref 0.44–1.00)
GFR, Estimated: >60 mL/min (ref >60)
GFR, Estimated: >60 mL/min (ref >60)
Glucose, Bld: 183 mg/dL — ABNORMAL HIGH (ref 70–99)
Glucose, Bld: 175 mg/dL — ABNORMAL HIGH (ref 70–99)
Potassium: 3.4 mmol/L — ABNORMAL LOW (ref 3.5–5.1)
Potassium: 3.5 mmol/L (ref 3.5–5.1)
Sodium: 130 mmol/L — ABNORMAL LOW (ref 135–145)
Sodium: 133 mmol/L — ABNORMAL LOW (ref 135–145)

## 2023-10-09 LAB — CBC
HCT: 27.5 % — ABNORMAL LOW (ref 36.0–46.0)
Hemoglobin: 8.5 g/dL — ABNORMAL LOW (ref 12.0–15.0)
MCH: 26.7 pg (ref 26.0–34.0)
MCHC: 30.9 g/dL (ref 30.0–36.0)
MCV: 86.5 fL (ref 80.0–100.0)
Platelets: 205 10*3/uL (ref 150–400)
RBC: 3.18 MIL/uL — ABNORMAL LOW (ref 3.87–5.11)
RDW: 18.4 % — ABNORMAL HIGH (ref 11.5–15.5)
WBC: 5.1 10*3/uL (ref 4.0–10.5)
nRBC: 0 % (ref 0.0–0.2)

## 2023-10-09 MED ORDER — SODIUM CHLORIDE 0.9 % IV SOLN
500.0000 mg | INTRAVENOUS | Status: DC
  Administered 2023-10-09 – 2023-10-12 (×4): 500 mg via INTRAVENOUS
  Filled 2023-10-09 (×4): qty 5

## 2023-10-09 MED ORDER — SODIUM CHLORIDE 0.9 % IV SOLN
1.0000 g | INTRAVENOUS | Status: DC
  Administered 2023-10-09 – 2023-10-12 (×4): 1 g via INTRAVENOUS
  Filled 2023-10-09 (×4): qty 10

## 2023-10-09 MED ORDER — POTASSIUM CHLORIDE CRYS ER 20 MEQ PO TBCR
40.0000 meq | EXTENDED_RELEASE_TABLET | Freq: Once | ORAL | Status: AC
  Administered 2023-10-09: 40 meq via ORAL
  Filled 2023-10-09: qty 2

## 2023-10-09 NOTE — ED Notes (Signed)
 Granddaughter requesting a social work consult as she doesn't feel like patient will benefit from going home alone when discharged. Dr. Meriam Sprague made aware.

## 2023-10-09 NOTE — Evaluation (Signed)
 Occupational Therapy Evaluation Patient Details Name: Kathryn Wise MRN: [id number] DOB: January 19, 1930 Today's Date: 10/09/2023   History of Present Illness   Kathryn Wise is a 89 y.o. female with medical history significant of HOCM, dCHF, HTN, HLD, CAD, hypothyroidism, depression, anemia, A-fib not on anticoagulants, LBBB, subdural hematoma, dysphagia, carotid artery stenosis, renal artery stenosis (s/p of renal artery stent placement), skin cancer, hyponatremia, who presents with shortness of breath. Patient was recently here  2/6 - 3/3 due to T12 burst fracture, UTI and rectus sheath hematoma and discharged to [facility] for STR.     Clinical Impressions Patient presenting with decreased Ind in self care, balance, functional mobility/transfers, endurance, and safety awareness. Patient report living at home independently before recent hospital admission and rehab stay. Patient currently functioning at min guard- min A. Pt needing to be placed on 4Ls O2 via [place] for mobility and toileting needs secondary to O2 saturation dropping to 68% with mobility while on 2Ls. Pt transferred to commode in bathroom with grandddaughter present and requesting to remain in order to attempt BM. Plan to sit up in wheelchair when finished.  Patient will benefit from acute OT to increase overall independence in the areas of ADLs, functional mobility, and safety awareness  in order to safely discharge.     If plan is discharge home, recommend the following:   Help with stairs or ramp for entrance;A lot of help with walking and/or transfers;A lot of help with bathing/dressing/bathroom;Direct supervision/assist for medications management;Direct supervision/assist for financial management;Assist for transportation     Functional Status Assessment   Patient has had a recent decline in their functional status and demonstrates the ability to make significant improvements in function in a reasonable and predictable  amount of time.     Equipment Recommendations   Other (comment) (defer to next venue of care)      Precautions/Restrictions   Precautions Precautions: Fall     Mobility Bed Mobility Overal bed mobility: Needs Assistance Bed Mobility: Sidelying to Sit   Sidelying to sit: Min assist            Transfers Overall transfer level: Needs assistance Equipment used: 1 person hand held assist Transfers: Sit to/from Stand, Bed to chair/wheelchair/BSC Sit to Stand: Min assist                  Balance Overall balance assessment: Needs assistance Sitting-balance support: Feet supported Sitting balance-Leahy Scale: Good     Standing balance support: Single extremity supported, During functional activity Standing balance-Leahy Scale: Good                             ADL either performed or assessed with clinical judgement   ADL Overall ADL's : Needs assistance/impaired                                             Vision Patient Visual Report: No change from baseline              Pertinent Vitals/Pain Pain Assessment Pain Assessment: No/denies pain     Extremity/Trunk Assessment Upper Extremity Assessment Upper Extremity Assessment: Generalized weakness   Lower Extremity Assessment Lower Extremity Assessment: Generalized weakness   Cervical / Trunk Assessment Cervical / Trunk Assessment: Normal   Communication Communication Communication: No apparent difficulties   Cognition  Arousal: Alert Behavior During Therapy: WFL for tasks assessed/performed Cognition: No apparent impairments                               Following commands: Intact       Cueing  General Comments   Cueing Techniques: Verbal cues              Home Living Family/patient expects to be discharged to:: Skilled nursing facility Living Arrangements: Alone Available Help at Discharge: Family;Available PRN/intermittently Type  of Home: House Home Access: Stairs to enter Entrance Stairs-Number of Steps: 1 Entrance Stairs-Rails: None Home Layout: One level     Bathroom Shower/Tub: Runner, broadcasting/film/video: Grab bars - tub/shower;Shower seat          Prior Functioning/Environment Prior Level of Function : Independent/Modified Independent;History of Falls (last six months);Driving             Mobility Comments: Prior to recent hospitalization, patient was IND with ambulation, no use of AD. During last admission, PT/OT recommend use of RW for safety. ADLs Comments: IND with ADLs and IADls    OT Problem List: Decreased activity tolerance;Impaired balance (sitting and/or standing);Decreased range of motion;Decreased knowledge of use of DME or AE;Decreased strength;Pain   OT Treatment/Interventions: Self-care/ADL training;Therapeutic exercise;Energy conservation;DME and/or AE instruction;Therapeutic activities      OT Goals(Current goals can be found in the care plan section)   Acute Rehab OT Goals Patient Stated Goal: to go back to rehab OT Goal Formulation: With patient/family Time For Goal Achievement: 10/23/23 Potential to Achieve Goals: Fair ADL Goals Pt Will Perform Grooming: with modified independence;standing Pt Will Perform Lower Body Dressing: with supervision;sit to/from stand;with adaptive equipment Pt Will Transfer to Toilet: with supervision;ambulating Pt Will Perform Toileting - Clothing Manipulation and hygiene: with supervision;sit to/from stand   OT Frequency:  Min 1X/week    Co-evaluation   Reason for Co-Treatment: To address functional/ADL transfers;For patient/therapist safety;Necessary to address cognition/behavior during functional activity PT goals addressed during session: Mobility/safety with mobility;Balance OT goals addressed during session: ADL's and self-care;Proper use of Adaptive equipment and DME      AM-PAC OT "6 Clicks" Daily Activity      Outcome Measure Help from another person eating meals?: None Help from another person taking care of personal grooming?: A Little Help from another person toileting, which includes using toliet, bedpan, or urinal?: A Lot Help from another person bathing (including washing, rinsing, drying)?: A Little Help from another person to put on and taking off regular upper body clothing?: A Little Help from another person to put on and taking off regular lower body clothing?: A Lot 6 Click Score: 17   End of Session Equipment Utilized During Treatment: Rolling walker (2 wheels);Oxygen Nurse Communication: Mobility status  Activity Tolerance: Patient tolerated treatment well Patient left: in bed;with call bell/phone within reach;with bed alarm set;with family/visitor present  OT Visit Diagnosis: Other abnormalities of gait and mobility (R26.89);Muscle weakness (generalized) (M62.81);History of falling (Z91.81)                Time: 1610-9604 OT Time Calculation (min): 17 min Charges:  OT General Charges $OT Visit: 1 Visit OT Evaluation $OT Eval Moderate Complexity: 1 [address], [place], OTR/L , CBIS ascom [phone]  10/09/23, 3:29 PM

## 2023-10-09 NOTE — Progress Notes (Signed)
 PT Cancellation Note  Patient Details Name: RANIYAH CURENTON MRN: [id number] DOB: [birth year]   Cancelled Treatment:    Reason Eval/Treat Not Completed: Patient declined, no reason specified Patient not feeling up to PT eval at this time. Will re-attempt at later time/date.  Maryah Marinaro 10/09/2023, 10:08 AM

## 2023-10-09 NOTE — Progress Notes (Signed)
 Progress Note   Patient: MAKYNZI EASTLAND Wise:[id number] DOB: 26-Sep-1929 DOA: 10/08/2023     1 DOS: the patient was seen and examined on 10/09/2023   Brief hospital course: ROSLYNN HOLTE is a 89 y.o. female with medical history significant of HOCM, dCHF, HTN, HLD, CAD, hypothyroidism, depression, anemia, A-fib not on anticoagulants, LBBB, subdural hematoma, dysphagia, carotid artery stenosis, renal artery stenosis (s/p of renal artery stent placement), skin cancer, hyponatremia, who presents with shortness of breath. Patient was recently hospitalized from 2/6 - 3/3 due to T12 burst fracture, UTI and rectus sheath hematoma and subsequently discharged to rehab.  Patient currently being managed for influenza A infection with underlying pneumonia and CHF exacerbation.I  Assessment and Plan:  Acute bronchitis due to influenza A infection:  Chronic hypoxic respiratory failure Patient uses 2 L of oxygen at home within the last 1 week prior to presentation -Continue bronchodilators and prn Mucinex -Solu-Medrol 40 mg IV bid -Incentive spirometry -As needed nasal cannula oxygen   Chronic diastolic CHF (congestive heart failure) [facility]): Patient has trace leg edema, but has elevated BNP 763, chest x-ray showed pulmonary edema, indicating fluid overload. -Patient received 40 mg Lasix in ED. Will not give more Lasix to avoid overdiuresis due to history of HOCM, also due to hyponatremia -Reevaluate patient volume status in the morning to decide further diuresis.   CAD (coronary atherosclerotic disease) and myocardial injury: trop  76 --> 73.  Likely due to demand ischemia.  Patient has positive D-dimer and pleuritic chest pain, need to rule out PE. -Patient was given 324 mg of aspirin in ED.  Will hold further aspirin due to recent history of rectus sheath hematoma Continue Crestor -As needed nitroglycerin, Norco CT angio ruled out PE   Hyponatremia: Sodium 128 on presentation Continue salt tablets as  well as fluid restriction   Hyperlipidemia LDL goal <130 Continue Crestor   Labile hypertension: Blood pressure 157/68 -Hold amlodipine -Continue Zebeta -IV hydralazine as needed   Hypothyroidism Continue Synthroid   HOCM (hypertrophic obstructive cardiomyopathy) (HCC) -Continue Zebeta -Avoid overdiuresis   Recent T12 burst fracture (HCC) -Continue as needed Robaxin and Norco   Recent rectus sheath hematoma Holding aspirin   Normocytic anemia: Monitor CBC closely   Positive D dimer CT scan of the chest ruled out PE Doppler ultrasound did not show DVT   Wide-complex tachycardia that is currently rate controlled Patient on heparin drip as well as amiodarone Cardiologist on board     DVT ppx: SCD   Code Status: DNR per pt and her granddaughter    Family Communication:     Yes, patient's granddaughter     Disposition Plan:  rehab   Consults called:  none   Admission status and Level of care: Telemetry Cardiac:  as inpt          Subjective:  Patient admits to improvement in respiratory function Denies chest pain nausea vomiting abdominal pain With granddaughter at bedside this morning  Physical Exam: General: Not in acute distress Heme: No neck lymph node enlargement. Cardiac: S1/S2, frequent premature beat, No gallops or rubs. Respiratory: has wheezing bilaterally GI: Soft, nondistended, nontender, no rebound pain Ext: has trace leg edema bilaterally. 1+DP/PT pulse bilaterally. Musculoskeletal: No joint deformities Skin: No rashes.  Neuro: Alert, oriented X3, cranial nerves II-XII grossly intact Psych: Patient is not psychotic, no suicidal or hemocidal ideation.   Vitals:   10/09/23 1100 10/09/23 1200 10/09/23 1510 10/09/23 1521  BP: 137/65 (!) 140/62 (!) 141/57  Pulse: 78 67 68 68  Resp: 20 (!) 22 17 20   Temp:    98.1 F (36.7 C)  TempSrc:    Oral  SpO2: 97% 96% 98% 98%    Data Reviewed: I have reviewed patient's CT angio of the chest that  did not show PE    Latest Ref Rng & Units 10/09/2023    3:31 AM 10/08/2023    3:17 PM 09/30/2023    1:22 AM  CBC  WBC 4.0 - 10.5 K/uL 5.1  5.5  11.1   Hemoglobin 12.0 - 15.0 g/dL 8.5  8.8  7.4   Hematocrit 36.0 - 46.0 % 27.5  28.6  24.5   Platelets 150 - 400 K/uL 205  210  194        Latest Ref Rng & Units 10/09/2023   10:52 AM 10/09/2023    3:31 AM 10/08/2023    5:16 PM  BMP  Glucose 70 - 99 mg/dL 161  096  045   BUN 8 - 23 mg/dL 12  13  13    Creatinine 0.44 - 1.00 mg/dL 4.09  8.11  9.14   Sodium 135 - 145 mmol/L 133  130  129   Potassium 3.5 - 5.1 mmol/L 3.5  3.4  4.5   Chloride 98 - 111 mmol/L 96  93  93   CO2 22 - 32 mmol/L 26  29  26    Calcium 8.9 - 10.3 mg/dL 7.8  8.0  8.4      Family Communication: Discussed with patient granddaughter  Disposition:Skilled nursing facility  Time spent: 55 minutes  Author: Loyce Dys, MD 10/09/2023 4:46 PM  For on call review [email or web address].

## 2023-10-09 NOTE — Evaluation (Signed)
 Physical Therapy Evaluation Patient Details Name: Kathryn Wise MRN: [id number] DOB: Oct 25, 1929 Today's Date: 10/09/2023  History of Present Illness  Kathryn Wise is a 89 y.o. female with medical history significant of HOCM, dCHF, HTN, HLD, CAD, hypothyroidism, depression, anemia, A-fib not on anticoagulants, LBBB, subdural hematoma, dysphagia, carotid artery stenosis, renal artery stenosis (s/p of renal artery stent placement), skin cancer, hyponatremia, who presents with shortness of breath. Patient was recently here  2/6 - 3/3 due to T12 burst fracture, UTI and rectus sheath hematoma and discharged to [facility] for STR.   Clinical Impression  Patient received lying on stretcher in ED. She is agreeable to PT assessment. Patient reports feeling poorly. She requires min A for bed mobility and stands from stretcher with supervision. Patient is able to ambulate 50 feet with single hand held A. No lob, but patient weak and sob with activity. O2 sats dropping on 2 liters therefore O2 increased to 4 liters with activity. She will continue to benefit from skilled PT to improve strength and activity tolerance.          If plan is discharge home, recommend the following: Assist for transportation;Help with stairs or ramp for entrance;A little help with walking and/or transfers;A little help with bathing/dressing/bathroom   Can travel by private vehicle   Yes    Equipment Recommendations None recommended by PT  Recommendations for Other Services       Functional Status Assessment Patient has had a recent decline in their functional status and demonstrates the ability to make significant improvements in function in a reasonable and predictable amount of time.     Precautions / Restrictions Precautions Precautions: Fall Restrictions Weight Bearing Restrictions Per Provider Order: No      Mobility  Bed Mobility Overal bed mobility: Needs Assistance Bed Mobility: Supine to Sit    Sidelying to sit: Min assist            Transfers Overall transfer level: Needs assistance Equipment used: None Transfers: Sit to/from Stand Sit to Stand: Supervision           General transfer comment: supervision for sit to stand    Ambulation/Gait Ambulation/Gait assistance: Contact guard assist Gait Distance (Feet): 50 Feet Assistive device: 1 person hand held assist Gait Pattern/deviations: Step-through pattern, Decreased step length - right, Decreased step length - left Gait velocity: decreased     General Gait Details: SOB and low O2 sats with ambulation limited distance.  Stairs            Wheelchair Mobility     Tilt Bed    Modified Rankin (Stroke Patients Only)       Balance Overall balance assessment: Needs assistance Sitting-balance support: Feet supported Sitting balance-Leahy Scale: Good     Standing balance support: Single extremity supported, During functional activity Standing balance-Leahy Scale: Good                               Pertinent Vitals/Pain Pain Assessment Pain Assessment: No/denies pain    Home Living Family/patient expects to be discharged to:: Skilled nursing facility Living Arrangements: Alone Available Help at Discharge: Family;Available PRN/intermittently Type of Home: House Home Access: Stairs to enter Entrance Stairs-Rails: None Entrance Stairs-Number of Steps: 1   Home Layout: One level Home Equipment: Grab bars - tub/shower;Shower seat      Prior Function Prior Level of Function : Independent/Modified Independent;History of Falls (last six months);Driving  Mobility Comments: Prior to recent hospitalization, patient was IND with ambulation, no use of AD. During last admission, PT/OT recommend use of RW for safety. ADLs Comments: IND with ADLs and IADls     Extremity/Trunk Assessment   Upper Extremity Assessment Upper Extremity Assessment: Defer to OT evaluation     Lower Extremity Assessment Lower Extremity Assessment: Generalized weakness    Cervical / Trunk Assessment Cervical / Trunk Assessment: Normal  Communication   Communication Communication: No apparent difficulties    Cognition Arousal: Alert Behavior During Therapy: WFL for tasks assessed/performed   PT - Cognitive impairments: No apparent impairments                         Following commands: Intact       Cueing Cueing Techniques: Verbal cues     General Comments      Exercises     Assessment/Plan    PT Assessment Patient needs continued PT services  PT Problem List Decreased strength;Decreased activity tolerance;Decreased balance;Decreased mobility;Cardiopulmonary status limiting activity       PT Treatment Interventions DME instruction;Gait training;Stair training;Functional mobility training;Therapeutic activities;Therapeutic exercise;Balance training;Patient/family education    PT Goals (Current goals can be found in the Care Plan section)  Acute Rehab PT Goals Patient Stated Goal: improve breathing, regain independence PT Goal Formulation: With patient/family Time For Goal Achievement: 10/23/23 Potential to Achieve Goals: Good    Frequency Min 2X/week     Co-evaluation PT/OT/SLP Co-Evaluation/Treatment: Yes Reason for Co-Treatment: To address functional/ADL transfers;For patient/therapist safety;Necessary to address cognition/behavior during functional activity PT goals addressed during session: Mobility/safety with mobility;Balance         AM-PAC PT "6 Clicks" Mobility  Outcome Measure Help needed turning from your back to your side while in a flat bed without using bedrails?: A Little Help needed moving from lying on your back to sitting on the side of a flat bed without using bedrails?: A Little Help needed moving to and from a bed to a chair (including a wheelchair)?: A Little Help needed standing up from a chair using your arms  (e.g., wheelchair or bedside chair)?: A Little Help needed to walk in hospital room?: A Little Help needed climbing 3-5 steps with a railing? : A Lot 6 Click Score: 17    End of Session Equipment Utilized During Treatment: Oxygen Activity Tolerance: Patient limited by fatigue Patient left: in chair;Other (comment) (patient left on toilet with daughter present in room. RN notified.) Nurse Communication: Mobility status PT Visit Diagnosis: Unsteadiness on feet (R26.81);Other abnormalities of gait and mobility (R26.89);Muscle weakness (generalized) (M62.81);Difficulty in walking, not elsewhere classified (R26.2)    Time: 1351-1415 PT Time Calculation (min) (ACUTE ONLY): 24 min   Charges:   PT Evaluation $PT Eval Moderate Complexity: 1 Mod   PT General Charges $$ ACUTE PT VISIT: 1 Visit         Aminat Shelburne, PT, GCS 10/09/23,2:31 PM

## 2023-10-09 NOTE — ED Notes (Signed)
 Pt assisted to toilet in rm by this EDT.

## 2023-10-09 NOTE — ED Notes (Signed)
 Patient became nauseated during neb treatment, no vomit noted; will medicate per [facility].

## 2023-10-10 DIAGNOSIS — J101 Influenza due to other identified influenza virus with other respiratory manifestations: Secondary | ICD-10-CM | POA: Diagnosis not present

## 2023-10-10 LAB — CBC WITH DIFFERENTIAL/PLATELET
Abs Immature Granulocytes: 0.03 10*3/uL (ref 0.00–0.07)
Basophils Absolute: 0 10*3/uL (ref 0.0–0.1)
Basophils Relative: 0 %
Eosinophils Absolute: 0.1 10*3/uL (ref 0.0–0.5)
Eosinophils Relative: 3 %
HCT: 26.6 % — ABNORMAL LOW (ref 36.0–46.0)
Hemoglobin: 8.2 g/dL — ABNORMAL LOW (ref 12.0–15.0)
Immature Granulocytes: 1 %
Lymphocytes Relative: 43 %
Lymphs Abs: 2.2 10*3/uL (ref 0.7–4.0)
MCH: 27 pg (ref 26.0–34.0)
MCHC: 30.8 g/dL (ref 30.0–36.0)
MCV: 87.5 fL (ref 80.0–100.0)
Monocytes Absolute: 0.4 10*3/uL (ref 0.1–1.0)
Monocytes Relative: 7 %
Neutro Abs: 2.4 10*3/uL (ref 1.7–7.7)
Neutrophils Relative %: 46 %
Platelets: 159 10*3/uL (ref 150–400)
RBC: 3.04 MIL/uL — ABNORMAL LOW (ref 3.87–5.11)
RDW: 18.6 % — ABNORMAL HIGH (ref 11.5–15.5)
WBC: 5.2 10*3/uL (ref 4.0–10.5)
nRBC: 0 % (ref 0.0–0.2)

## 2023-10-10 LAB — BASIC METABOLIC PANEL
Anion gap: 8 (ref 5–15)
BUN: 13 mg/dL (ref 8–23)
CO2: 29 mmol/L (ref 22–32)
Calcium: 8.3 mg/dL — ABNORMAL LOW (ref 8.9–10.3)
Chloride: 96 mmol/L — ABNORMAL LOW (ref 98–111)
Creatinine, Ser: 0.68 mg/dL (ref 0.44–1.00)
GFR, Estimated: >60 mL/min (ref >60)
Glucose, Bld: 145 mg/dL — ABNORMAL HIGH (ref 70–99)
Potassium: 4.4 mmol/L (ref 3.5–5.1)
Sodium: 133 mmol/L — ABNORMAL LOW (ref 135–145)

## 2023-10-10 MED ORDER — PREDNISONE 20 MG PO TABS
40.0000 mg | ORAL_TABLET | Freq: Every day | ORAL | Status: DC
  Administered 2023-10-11 – 2023-10-12 (×2): 40 mg via ORAL
  Filled 2023-10-10 (×2): qty 2

## 2023-10-10 NOTE — ED Notes (Signed)
 Patient assisted to use toilet by this RN. Patient had steady gait without use of any assistive devices. Patient urinated and returned to bed. Patient denies other needs at this time. Bed in lowest position with call light in reach.

## 2023-10-10 NOTE — Plan of Care (Signed)

## 2023-10-10 NOTE — ED Notes (Signed)
 Pt ABCs intact. RR even and unlabored. Pt in NAD. Bed in lowest locked position. Call bell in reach. Pt denies need for DuoNeb treatment at this time.

## 2023-10-10 NOTE — Progress Notes (Signed)
 Progress Note   Patient: Kathryn Wise:[id number] DOB: May 11, 1930 DOA: 10/08/2023     2 DOS: the patient was seen and examined on 10/10/2023    Brief hospital course: Kathryn Wise is a 89 y.o. female with medical history significant of HOCM, dCHF, HTN, HLD, CAD, hypothyroidism, depression, anemia, A-fib not on anticoagulants, LBBB, subdural hematoma, dysphagia, carotid artery stenosis, renal artery stenosis (s/p of renal artery stent placement), skin cancer, hyponatremia, who presents with shortness of breath. Patient was recently hospitalized from 2/6 - 3/3 due to T12 burst fracture, UTI and rectus sheath hematoma and subsequently discharged to rehab.  Patient currently being managed for influenza A infection with underlying pneumonia and CHF exacerbation.I   Assessment and Plan:  Acute bronchitis due to influenza A infection:  Chronic hypoxic respiratory failure Patient uses 2 L of oxygen at home within the last 1 week prior to presentation -Continue bronchodilators and prn Mucinex I will switch IV steroids to p.o. today -Incentive spirometry -As needed nasal cannula oxygen   Chronic diastolic CHF (congestive heart failure) [facility]): Patient has trace leg edema, but has elevated BNP 763, chest x-ray showed pulmonary edema, indicating fluid overload. -Patient received 40 mg Lasix in ED. Will not give more Lasix to avoid overdiuresis due to history of HOCM, also due to hyponatremia -Reevaluate patient volume status in the morning to decide further diuresis.   CAD (coronary atherosclerotic disease) and myocardial injury: trop  76 --> 73.  Likely due to demand ischemia.  Patient has positive D-dimer and pleuritic chest pain, need to rule out PE. -Patient was given 324 mg of aspirin in ED.  Will hold further aspirin due to recent history of rectus sheath hematoma Continue Crestor -As needed nitroglycerin, Norco CT angio ruled out PE   Hyponatremia: Sodium 128 on presentation Continue  salt tablets as well as fluid restriction   Hyperlipidemia LDL goal <130 Continue Crestor   Labile hypertension: Blood pressure 157/68 -Hold amlodipine -Continue Zebeta -IV hydralazine as needed   Hypothyroidism Continue Synthroid   HOCM (hypertrophic obstructive cardiomyopathy) (HCC) -Continue Zebeta -Avoid overdiuresis   Recent T12 burst fracture (HCC) Continue as needed Robaxin and Norco   Recent rectus sheath hematoma Holding aspirin   Normocytic anemia: Monitor CBC closely   Positive D dimer CT scan of the chest ruled out PE Doppler ultrasound did not show DVT   Wide-complex tachycardia that is currently rate controlled I have discussed with cardiology team and amiodarone drip have been discontinued We will continue patient's home dose of bisoprolol Patient will follow-up with cardiologist as an outpatient     DVT ppx: SCD   Code Status: DNR per pt and her granddaughter    Family Communication:     Yes, patient's granddaughter     Disposition Plan:  rehab   Consults called:  none   Admission status and Level of care: Telemetry Cardiac:  as inpt           Subjective:  Patient continues to admit to improvement in respiratory function Currently on 1.5 L of oxygen but uses no oxygen at home Denies chest pain nausea vomiting abdominal pain   Physical Exam: General: Not in acute distress Heme: No neck lymph node enlargement. Cardiac: S1/S2, frequent premature beat, No gallops or rubs. Respiratory: has wheezing bilaterally GI: Soft, nondistended, nontender, no rebound pain Ext: has trace leg edema bilaterally. 1+DP/PT pulse bilaterally. Musculoskeletal: No joint deformities Skin: No rashes.  Neuro: Alert, oriented X3, cranial nerves II-XII grossly intact  Psych: Patient is not psychotic, no suicidal or hemocidal ideation.  Data Reviewed:  Vitals:   10/10/23 1430 10/10/23 1443 10/10/23 1447 10/10/23 1543  BP: (!) 168/69  (!) 166/63 (!) 157/63   Pulse: (!) 121   69  Resp: 15   18  Temp:  97.7 F (36.5 C)  98.5 F (36.9 C)  TempSrc:  Oral  Oral  SpO2: 99%   100%      Latest Ref Rng & Units 10/10/2023    5:25 AM 10/09/2023    3:31 AM 10/08/2023    3:17 PM  CBC  WBC 4.0 - 10.5 K/uL 5.2  5.1  5.5   Hemoglobin 12.0 - 15.0 g/dL 8.2  8.5  8.8   Hematocrit 36.0 - 46.0 % 26.6  27.5  28.6   Platelets 150 - 400 K/uL 159  205  210        Latest Ref Rng & Units 10/10/2023    5:25 AM 10/09/2023   10:52 AM 10/09/2023    3:31 AM  BMP  Glucose 70 - 99 mg/dL 161  096  045   BUN 8 - 23 mg/dL 13  12  13    Creatinine 0.44 - 1.00 mg/dL 4.09  8.11  9.14   Sodium 135 - 145 mmol/L 133  133  130   Potassium 3.5 - 5.1 mmol/L 4.4  3.5  3.4   Chloride 98 - 111 mmol/L 96  96  93   CO2 22 - 32 mmol/L 29  26  29    Calcium 8.9 - 10.3 mg/dL 8.3  7.8  8.0      Time spent: 45 minutes  Author: Loyce Dys, MD 10/10/2023 5:13 PM  For on call review [email or web address].

## 2023-10-10 NOTE — ED Notes (Signed)
 This RN is continuing care of pt at this time. Pt with CHF and on 2lpm via [place]. Pt is Flu positive and earlier presented with wide complex tachycardia. Pt is currently on an Amiodarone drip that has been successful in slowing down the HR, complex still appears to be wide. Pt ABCs intact. RR even and unlabored on the 2lpm. Pt in NAD. Bed in lowest locked position. Call bell in reach. Denies needs at this time.   Past Medical History:  Diagnosis Date   Arthritis    Collagen vascular disease (HCC)    Dysphagia, pharyngoesophageal phase 09/10/2013   Upper GI study with barium swallow was done at  [facility] Mar 2015   No reflux seen  Small irreducible hiatal hernia  Mild changes of presbyeophagus (abnormal contractions of the esophagus that occur with aging) No strictures Normal gastric emptying  Incomplete visualization of stomach fold due to patient's inability turn     GERD (gastroesophageal reflux disease)    Heart murmur    has had years and years   History of shingles Dec 2013   treated with steroids , post op from shoulder surgery   History of squamous cell carcinoma 05/02/2016   right mid lateral pretibial   Hypertension    Hypothyroidism    Major depressive disorder, single episode 11/05/2015   Osteoarthritis of left shoulder 07/14/2012   Pneumonia 10/21/2020   Squamous cell carcinoma of skin 05/12/2016   R mid lat pretibial - other skin cancers treated by Dr. Jarold Motto   Squamous cell carcinoma of skin 08/22/2020   left distal medial popliteal - [facility]

## 2023-10-10 NOTE — ED Notes (Signed)
 Pt provided with warm blankets for comfort. Pt ABCs intact. RR even and unlabored. Pt in NAD. Bed in lowest locked position. Call bell in reach. Denies further needs at this time.

## 2023-10-10 NOTE — ED Notes (Signed)
 Patient assisted to use toilet by this RN. Patient had steady gait without use of any assistive devices. Patient urinated and returned to bed. Patient denies other needs at this time. Bed in lowest position with call light in reach. VSS at this time.

## 2023-10-10 NOTE — Progress Notes (Signed)
 Occupational Therapy Treatment Patient Details Name: Kathryn Wise MRN: [id number] DOB: 05-Nov-1929 Today's Date: 10/10/2023   History of present illness Kathryn Wise is a 89 y.o. female with medical history significant of HOCM, dCHF, HTN, HLD, CAD, hypothyroidism, depression, anemia, A-fib not on anticoagulants, LBBB, subdural hematoma, dysphagia, carotid artery stenosis, renal artery stenosis (s/p of renal artery stent placement), skin cancer, hyponatremia, who presents with shortness of breath. Patient was recently here  2/6 - 3/3 due to T12 burst fracture, UTI and rectus sheath hematoma and discharged to [facility] for STR.   OT comments  Pt is supine in bed on arrival. Easily arousable and agreeable to OT session. She denies pain at rest, but reports mild back pain during ADL performance. Pt performed bed mobility with SUP and verb cues. CGA for STS and ~8 feet mobility via HHA. Pt required Min A for LB ADLs with cueing to follow back precautions and SUP for seated UB ADLs. Sp02 on 2L dropped to 85% with activity, but improved to 92% with rest.  Pt returned to bed with all needs in place and will cont to require skilled acute OT services to maximize her safety and IND to return to PLOF.       If plan is discharge home, recommend the following:  Help with stairs or ramp for entrance;Direct supervision/assist for medications management;Direct supervision/assist for financial management;Assist for transportation;A little help with walking and/or transfers;A little help with bathing/dressing/bathroom   Equipment Recommendations  Tub/shower seat;Other (comment) (defer)    Recommendations for Other Services      Precautions / Restrictions Precautions Precautions: Fall Precaution/Restrictions Comments: TLSO in room, pt reports back precautions in place and TLSO when ambulating although she does not prefer to wear it Required Braces or Orthoses: Spinal Brace Spinal Brace: Thoracolumbosacral  orthotic Restrictions Weight Bearing Restrictions Per Provider Order: No       Mobility Bed Mobility Overal bed mobility: Needs Assistance Bed Mobility: Sidelying to Sit, Sit to Sidelying   Sidelying to sit: Supervision     Sit to sidelying: Supervision      Transfers Overall transfer level: Needs assistance Equipment used: 1 person hand held assist Transfers: Sit to/from Stand Sit to Stand: Supervision           General transfer comment: supervision for sit to stand and HHA for in room mobility to the bathroom and back ~8 feet     Balance Overall balance assessment: Needs assistance Sitting-balance support: Feet supported Sitting balance-Leahy Scale: Good     Standing balance support: Single extremity supported, During functional activity Standing balance-Leahy Scale: Good Standing balance comment: unilateral support on sink for bathing tasks standing at sink                           ADL either performed or assessed with clinical judgement   ADL Overall ADL's : Needs assistance/impaired     Grooming: Wash/dry face;Standing;Applying deodorant;Supervision/safety Grooming Details (indicate cue type and reason): at sink     Lower Body Bathing: Supervison/ safety;Cueing for back precautions;Sit to/from stand;Sitting/lateral leans   Upper Body Dressing : Set up;Sitting   Lower Body Dressing: Supervision/safety;Cueing for back precautions;Sitting/lateral leans;Sit to/from stand Lower Body Dressing Details (indicate cue type and reason): donning mesh panties Toilet Transfer: Supervision/safety;Regular Toilet                  Extremity/Trunk Assessment  Vision       Perception     Praxis     Communication Communication Communication: No apparent difficulties   Cognition Arousal: Alert Behavior During Therapy: WFL for tasks assessed/performed Cognition: No apparent impairments                                Following commands: Intact        Cueing   Cueing Techniques: Verbal cues  Exercises Other Exercises Other Exercises: edu on ECS/pacing during ADL performance    Shoulder Instructions       General Comments re-edu on BLT precautions and use of TLSO when up ambulating    Pertinent Vitals/ Pain       Pain Assessment Pain Assessment: Faces Faces Pain Scale: Hurts a little bit Pain Location: Back Pain Descriptors / Indicators: Aching Pain Intervention(s): Monitored during session, Repositioned  Home Living Family/patient expects to be discharged to:: Skilled nursing facility                                        Prior Functioning/Environment              Frequency  Min 2X/week        Progress Toward Goals  OT Goals(current goals can now be found in the care plan section)  Progress towards OT goals: Progressing toward goals  Acute Rehab OT Goals Patient Stated Goal: feel better OT Goal Formulation: With patient Time For Goal Achievement: 10/23/23 Potential to Achieve Goals: Fair  Plan      Co-evaluation                 AM-PAC OT "6 Clicks" Daily Activity     Outcome Measure   Help from another person eating meals?: None Help from another person taking care of personal grooming?: None Help from another person toileting, which includes using toliet, bedpan, or urinal?: A Little Help from another person bathing (including washing, rinsing, drying)?: A Lot Help from another person to put on and taking off regular upper body clothing?: A Little Help from another person to put on and taking off regular lower body clothing?: A Lot 6 Click Score: 18    End of Session Equipment Utilized During Treatment: Oxygen  OT Visit Diagnosis: Other abnormalities of gait and mobility (R26.89);Muscle weakness (generalized) (M62.81);History of falling (Z91.81)   Activity Tolerance Patient tolerated treatment well   Patient Left in bed;with  call bell/phone within reach;with bed alarm set   Nurse Communication Mobility status        Time: 2956-2130 OT Time Calculation (min): 26 min  Charges: OT General Charges $OT Visit: 1 Visit OT Treatments $Self Care/Home Management : 23-37 mins  Nashonda Limberg, OTR/L  10/10/23, 5:04 PM   Tremeka Helbling E Trudie Cervantes 10/10/2023, 5:02 PM

## 2023-10-11 DIAGNOSIS — J101 Influenza due to other identified influenza virus with other respiratory manifestations: Secondary | ICD-10-CM | POA: Diagnosis not present

## 2023-10-11 LAB — CBC WITH DIFFERENTIAL/PLATELET
Abs Immature Granulocytes: 0.04 10*3/uL (ref 0.00–0.07)
Basophils Absolute: 0 10*3/uL (ref 0.0–0.1)
Basophils Relative: 0 %
Eosinophils Absolute: 0 10*3/uL (ref 0.0–0.5)
Eosinophils Relative: 1 %
HCT: 25.9 % — ABNORMAL LOW (ref 36.0–46.0)
Hemoglobin: 8 g/dL — ABNORMAL LOW (ref 12.0–15.0)
Immature Granulocytes: 1 %
Lymphocytes Relative: 44 %
Lymphs Abs: 2.9 10*3/uL (ref 0.7–4.0)
MCH: 26.9 pg (ref 26.0–34.0)
MCHC: 30.9 g/dL (ref 30.0–36.0)
MCV: 87.2 fL (ref 80.0–100.0)
Monocytes Absolute: 0.7 10*3/uL (ref 0.1–1.0)
Monocytes Relative: 10 %
Neutro Abs: 3 10*3/uL (ref 1.7–7.7)
Neutrophils Relative %: 44 %
Platelets: 185 10*3/uL (ref 150–400)
RBC: 2.97 MIL/uL — ABNORMAL LOW (ref 3.87–5.11)
RDW: 18.4 % — ABNORMAL HIGH (ref 11.5–15.5)
Smear Review: NORMAL
WBC: 6.6 10*3/uL (ref 4.0–10.5)
nRBC: 0 % (ref 0.0–0.2)

## 2023-10-11 LAB — BASIC METABOLIC PANEL
Anion gap: 6 (ref 5–15)
BUN: 13 mg/dL (ref 8–23)
CO2: 30 mmol/L (ref 22–32)
Calcium: 8.4 mg/dL — ABNORMAL LOW (ref 8.9–10.3)
Chloride: 99 mmol/L (ref 98–111)
Creatinine, Ser: 0.65 mg/dL (ref 0.44–1.00)
GFR, Estimated: >60 mL/min (ref >60)
Glucose, Bld: 102 mg/dL — ABNORMAL HIGH (ref 70–99)
Potassium: 4 mmol/L (ref 3.5–5.1)
Sodium: 135 mmol/L (ref 135–145)

## 2023-10-11 MED ORDER — IPRATROPIUM-ALBUTEROL 0.5-2.5 (3) MG/3ML IN SOLN
3.0000 mL | Freq: Four times a day (QID) | RESPIRATORY_TRACT | Status: DC
Start: 2023-10-11 — End: 2023-10-12
  Administered 2023-10-11 (×2): 3 mL via RESPIRATORY_TRACT
  Filled 2023-10-11 (×2): qty 3

## 2023-10-11 MED ORDER — FUROSEMIDE 10 MG/ML IJ SOLN
40.0000 mg | Freq: Every day | INTRAMUSCULAR | Status: DC
  Administered 2023-10-11 – 2023-10-12 (×2): 40 mg via INTRAVENOUS
  Filled 2023-10-11 (×2): qty 4

## 2023-10-11 NOTE — Care Management Important Message (Signed)
 Important Message  Patient Details  Name: Kathryn Wise MRN: [id number] Date of Birth: 04-Dec-1929   Important Message Given:  Yes - Medicare IM     Caryn Gienger W, CMA 10/11/2023, 10:59 AM

## 2023-10-11 NOTE — Plan of Care (Signed)
  Problem: Education: Goal: Knowledge of General Education information will improve Description: Including pain rating scale, medication(s)/side effects and non-pharmacologic comfort measures Outcome: Progressing   Problem: Clinical Measurements: Goal: Will remain free from infection Outcome: Progressing Goal: Diagnostic test results will improve Outcome: Progressing Goal: Respiratory complications will improve Outcome: Progressing Goal: Cardiovascular complication will be avoided Outcome: Progressing   Problem: Nutrition: Goal: Adequate nutrition will be maintained Outcome: Progressing   Problem: Coping: Goal: Level of anxiety will decrease Outcome: Progressing   Problem: Elimination: Goal: Will not experience complications related to urinary retention Outcome: Progressing   Problem: Safety: Goal: Ability to remain free from injury will improve Outcome: Progressing

## 2023-10-11 NOTE — Progress Notes (Signed)
   10/11/23 1900  Spiritual Encounters  Type of Visit Follow up  Care provided to: Pt and family (Daughter is at bedside)  Conversation partners present during Programmer, systems  Referral source Chaplain assessment  Reason for visit Routine spiritual support  OnCall Visit Yes  Spiritual Framework  Presenting Themes Goals in life/care;Significant life change;Impactful experiences and emotions  Interventions  Spiritual Care Interventions Made Established relationship of care and support;Compassionate presence;Reflective listening;Encouragement;Prayer;Other (comment) (Pt asked Chaplain to keep her in my prayers)  Intervention Outcomes  Outcomes Connection to spiritual care;Awareness around self/spiritual resourses;Connection to values and goals of care;Awareness of health;Awareness of support

## 2023-10-11 NOTE — TOC Progression Note (Addendum)
 Transition of Care [facility]) - Progression Note    Patient Details  Name: Kathryn Wise MRN: [id number] Date of Birth: 07/19/1930  [facility] of [facility]) CM/SW Contact  Truddie Hidden, RN Phone Number: 10/11/2023, 3:31 PM  Clinical Narrative:    Spoke with patient regarding therapy's recommendation for [facility] PT. She request to speak with her daughter. Patient daughter is currently unavailable to speak.  4::05pm Attempt to reach patient's daughter. No answer. Unable to leave a message.          Expected Discharge Plan and Services                                               Social Determinants of Health (SDOH) Interventions SDOH Screenings   Food Insecurity: No Food Insecurity (10/10/2023)  Housing: Low Risk  (10/10/2023)  Transportation Needs: No Transportation Needs (10/10/2023)  Utilities: Not At Risk (10/10/2023)  Depression (PHQ2-9): Low Risk  (10/10/2021)  Recent Concern: Depression (PHQ2-9) - Medium Risk (09/26/2021)  Financial Resource Strain: Low Risk  (09/12/2023)   Received from [facility]  Physical Activity: Sufficiently Active (04/20/2020)  Social Connections: Moderately Isolated (10/10/2023)  Stress: No Stress Concern Present (04/20/2020)  Tobacco Use: Medium Risk (10/08/2023)    Readmission Risk Interventions     No data to display

## 2023-10-11 NOTE — [place] FL2 (Signed)
 [place] MEDICAID [id number] LEVEL OF CARE FORM     IDENTIFICATION  Patient Name: Kathryn Wise Birthdate: 1930/05/17 Sex: female Admission Date (Current Location): 10/08/2023  [facility] and [place] Number:  Chiropodist and Address:  [facility], [address] [place], [place] [address]      Provider Number: [id number]  Attending Physician Name and Address:  Loyce Dys, MD  Relative Name and Phone Number:  Hannah Beat (Granddaughter)  [phone] (Home Phone)    Current Level of Care: Hospital Recommended Level of Care: Skilled Nursing Facility Prior Approval Number:    Date Approved/Denied:   PASRR Number: [id number]  Discharge Plan: SNF    Current Diagnoses: Patient Active Problem List   Diagnosis Date Noted   Influenza A 10/08/2023   Acute bronchitis due to Haemophilus influenzae A 10/08/2023   Acute bronchitis 10/08/2023   Normocytic anemia 10/08/2023   Myocardial injury 10/08/2023   Chronic diastolic CHF (congestive heart failure) (HCC) 10/08/2023   Positive D dimer 10/08/2023   T12 burst fracture (HCC) 09/25/2023   Orthostatic hypotension 09/23/2023   Rectus sheath hematoma 09/22/2023   Symptomatic anemia 09/22/2023   Obstructive hypertrophic cardiomyopathy (HCC) 03/02/2023   UTI (urinary tract infection) 03/01/2023   NSTEMI (non-ST elevated myocardial infarction) (HCC) 03/01/2023   Acute heart failure with preserved ejection fraction (HFpEF) (HCC) 03/01/2023   Leukocytosis 03/01/2023   Rigors 03/01/2023   Demand ischemia (HCC) 03/01/2023   Paroxysmal atrial flutter (HCC) 08/18/2022   Near syncope 08/18/2022   Paroxysmal tachycardia (HCC) 08/18/2022   HOCM (hypertrophic obstructive cardiomyopathy) (HCC) 08/18/2022   Labile hypertension 08/18/2022   Chest pain 08/18/2022   Paroxysmal atrial fibrillation with RVR (HCC) 08/17/2022   Subdural hematoma (HCC) 11/14/2021   Iron deficiency anemia, unspecified 10/12/2021    Cerumen impaction 10/10/2021   Prediabetes 10/10/2021   Polyarthralgia 09/26/2021   Decreased glomerular filtration rate (GFR) 04/01/2021   Aortic atherosclerosis (HCC) 03/28/2021   Hyponatremia 10/21/2020   Elevated troponin 10/21/2020   Carotid stenosis, symptomatic w/o infarct, bilateral 10/19/2020   Carotid stenosis 09/12/2020   Urinary incontinence, urge 05/05/2020   Arteriosclerotic cerebrovascular disease 05/05/2020   Recurrent occipital headache 03/07/2019   Sensorineural hearing loss (SNHL) of left ear 01/23/2019   Renal artery stenosis (HCC) 01/01/2019   Lymphedema 01/01/2019   PAD (peripheral artery disease) (HCC) 01/01/2019   History of TIA (transient ischemic attack) 11/13/2018   Tinnitus aurium, bilateral 10/19/2018   Blurred vision, bilateral 10/19/2018   Insomnia due to psychological stress 10/07/2018   Food intolerance in adult 10/07/2018   Myalgia due to statin 12/10/2017   Hyperlipidemia LDL goal <130 10/03/2017   Hiatal hernia 07/03/2017   CAD (coronary atherosclerotic disease) 07/03/2017   Hospital discharge follow-up 07/03/2017   Chest pain due to CAD (HCC) 06/10/2017   Pain in left wrist 09/20/2016   Breast cancer screening by mammogram 09/20/2016   Precordial pain 05/31/2016   Hypothyroidism 04/28/2015   Renovascular hypertension 01/13/2014   Osteopenia 10/25/2013   Disorder of bone and cartilage 10/25/2013   GERD (gastroesophageal reflux disease) 09/11/2013   Encounter for preventive health examination 09/11/2013   Heart murmur 11/28/2012   History of shingles    Peripheral edema 11/08/2012   Osteoarthritis of left shoulder 07/14/2012    Orientation RESPIRATION BLADDER Height & Weight     Self, Time, Situation, Place  O2 (022L) Incontinent Weight: 76 kg Height:     BEHAVIORAL SYMPTOMS/MOOD NEUROLOGICAL BOWEL NUTRITION STATUS  Other (Comment) (n/a)  (  n/a) Continent Diet (Heart)  AMBULATORY STATUS COMMUNICATION OF NEEDS Skin   Extensive Assist  Verbally Bruising (Left hip)                       Personal Care Assistance Level of Assistance  Bathing, Feeding, Dressing Bathing Assistance: Limited assistance Feeding assistance: Limited assistance Dressing Assistance: Limited assistance     Functional Limitations Info  Sight, Hearing, Speech Sight Info: Impaired Hearing Info: Impaired Speech Info: Adequate    SPECIAL CARE FACTORS FREQUENCY  PT (By licensed PT), OT (By licensed OT)     PT Frequency: Min 2x weekly OT Frequency: Min 2x weekly            Contractures Contractures Info: Not present    Additional Factors Info  Code Status, Allergies Code Status Info: DNR Allergies Info: Alendronate, Codeine, Lisinopril, Metoprolol, Oxycodone, Oxycontin (Oxycodone Hcl)           Current Medications (10/11/2023):  This is the current hospital active medication list Current Facility-Administered Medications  Medication Dose Route Frequency Provider Last Rate Last Admin   acetaminophen (TYLENOL) tablet 650 mg  650 mg Oral Q6H PRN Lorretta Harp, MD   650 mg at 10/11/23 1248   albuterol (PROVENTIL) (2.5 MG/3ML) 0.083% nebulizer solution 2.5 mg  2.5 mg Nebulization Q4H PRN Lorretta Harp, MD   2.5 mg at 10/09/23 2122   azithromycin (ZITHROMAX) 500 mg in sodium chloride 0.9 % 250 mL IVPB  500 mg Intravenous Q24H Rosezetta Schlatter T, MD 250 mL/hr at 10/11/23 1057 500 mg at 10/11/23 1057   bisoprolol (ZEBETA) tablet 5 mg  5 mg Oral Raeanne Gathers, MD   5 mg at 10/10/23 0848   cefTRIAXone (ROCEPHIN) 1 g in sodium chloride 0.9 % 100 mL IVPB  1 g Intravenous Q24H Rosezetta Schlatter T, MD 200 mL/hr at 10/11/23 1022 1 g at 10/11/23 1022   cholecalciferol (VITAMIN D3) tablet 1,000 Units  1,000 Units Oral Daily Lorretta Harp, MD   1,000 Units at 10/11/23 1610   cyanocobalamin (VITAMIN B12) tablet 1,000 mcg  1,000 mcg Oral Daily Lorretta Harp, MD   1,000 mcg at 10/11/23 9604   dextromethorphan-guaiFENesin (MUCINEX DM) 30-600 MG per 12 hr tablet 1 tablet   1 tablet Oral BID PRN Lorretta Harp, MD       hydrALAZINE (APRESOLINE) injection 2 mg  2 mg Intravenous Q2H PRN Lorretta Harp, MD   2 mg at 10/10/23 2127   HYDROcodone-acetaminophen (NORCO/VICODIN) 5-325 MG per tablet 1-2 tablet  1-2 tablet Oral Q4H PRN Lorretta Harp, MD       ipratropium-albuterol (DUONEB) 0.5-2.5 (3) MG/3ML nebulizer solution 3 mL  3 mL Nebulization Q6H Loyce Dys, MD   3 mL at 10/11/23 1345   levothyroxine (SYNTHROID) tablet 75 mcg  75 mcg Oral Q0600 Lorretta Harp, MD   75 mcg at 10/10/23 2057   lidocaine (LIDODERM) 5 % 1-3 patch  1-3 patch Transdermal Q24H Lorretta Harp, MD   1 patch at 10/09/23 1931   methocarbamol (ROBAXIN) tablet 500 mg  500 mg Oral Q8H PRN Lorretta Harp, MD       nitroGLYCERIN (NITROSTAT) SL tablet 0.4 mg  0.4 mg Sublingual Q5 min PRN Lorretta Harp, MD       ondansetron [facility]) injection 4 mg  4 mg Intravenous Q8H PRN Lorretta Harp, MD   4 mg at 10/10/23 5409   oseltamivir (TAMIFLU) capsule 30 mg  30 mg Oral BID Lorretta Harp, MD  30 mg at 10/11/23 0852   pantoprazole (PROTONIX) EC tablet 40 mg  40 mg Oral Daily Lorretta Harp, MD   40 mg at 10/11/23 1914   predniSONE (DELTASONE) tablet 40 mg  40 mg Oral Q breakfast Rosezetta Schlatter T, MD   40 mg at 10/11/23 7829   rosuvastatin (CRESTOR) tablet 10 mg  10 mg Oral QHS Lorretta Harp, MD   10 mg at 10/10/23 2057   sodium chloride tablet 1 g  1 g Oral BID WC Lorretta Harp, MD   1 g at 10/11/23 0852   temazepam (RESTORIL) capsule 15 mg  15 mg Oral QHS PRN Lorretta Harp, MD   15 mg at 10/10/23 2324     Discharge Medications: Please see discharge summary for a list of discharge medications.  Relevant Imaging Results:  Relevant Lab Results:   Additional Information SS# [id number]  Truddie Hidden, RN

## 2023-10-11 NOTE — Progress Notes (Signed)
 Physical Therapy Treatment Patient Details Name: Kathryn Wise MRN: [id number] DOB: 21-Jun-1930 Today's Date: 10/11/2023   History of Present Illness Kathryn Wise is a 89 y.o. female with medical history significant of HOCM, dCHF, HTN, HLD, CAD, hypothyroidism, depression, anemia, A-fib not on anticoagulants, LBBB, subdural hematoma, dysphagia, carotid artery stenosis, renal artery stenosis (s/p of renal artery stent placement), skin cancer, hyponatremia, who presents with shortness of breath. Patient was recently here  2/6 - 3/3 due to T12 burst fracture, UTI and rectus sheath hematoma and discharged to [facility] for STR.    PT Comments  Patient received sitting edge of bed, family at bedside. She is anxious to get moving and out of bed. Patient stands from bed and commode with mod I. Ambulated 200 feet with RW and cga. O2 sats remained > 90% throughout mobility on 2 liters. Patient improving and will continue to benefit from skilled PT to improve activity tolerance and strength.      If plan is discharge home, recommend the following: Assist for transportation;Help with stairs or ramp for entrance;A little help with walking and/or transfers;A little help with bathing/dressing/bathroom   Can travel by private vehicle     Yes  Equipment Recommendations  None recommended by PT    Recommendations for Other Services       Precautions / Restrictions Precautions Precautions: Fall Precaution/Restrictions Comments: TLSO in room, pt reports back precautions in place and TLSO when ambulating although she does not prefer to wear it Required Braces or Orthoses: Spinal Brace Spinal Brace: Thoracolumbosacral orthotic Restrictions Weight Bearing Restrictions Per Provider Order: No     Mobility  Bed Mobility Overal bed mobility: Modified Independent             General bed mobility comments: Patient able to go sit to long sitting independently.    Transfers Overall transfer level:  Needs assistance Equipment used: Rolling walker (2 wheels) Transfers: Sit to/from Stand Sit to Stand: Modified independent (Device/Increase time)                Ambulation/Gait Ambulation/Gait assistance: Contact guard assist Gait Distance (Feet): 200 Feet Assistive device: Rolling walker (2 wheels) Gait Pattern/deviations: Step-through pattern Gait velocity: WFL     General Gait Details: Patient ambulated with RW and 2 liters O2, Cga. No lob, O2 dropped to 90% with ambulation on 2 liters.   Stairs             Wheelchair Mobility     Tilt Bed    Modified Rankin (Stroke Patients Only)       Balance Overall balance assessment: Modified Independent Sitting-balance support: Feet supported Sitting balance-Leahy Scale: Good     Standing balance support: Bilateral upper extremity supported, During functional activity, Reliant on assistive device for balance Standing balance-Leahy Scale: Good                              Communication Communication Communication: No apparent difficulties  Cognition Arousal: Alert Behavior During Therapy: WFL for tasks assessed/performed   PT - Cognitive impairments: No apparent impairments                         Following commands: Intact      Cueing Cueing Techniques: Verbal cues  Exercises      General Comments        Pertinent Vitals/Pain Pain Assessment Pain Assessment: No/denies pain Pain Intervention(s):  Monitored during session    Home Living                          Prior Function            PT Goals (current goals can now be found in the care plan section) Acute Rehab PT Goals Patient Stated Goal: improve breathing, regain independence PT Goal Formulation: With patient Time For Goal Achievement: 10/23/23 Potential to Achieve Goals: Good Progress towards PT goals: Progressing toward goals    Frequency    Min 2X/week      PT Plan      Co-evaluation               AM-PAC PT "6 Clicks" Mobility   Outcome Measure  Help needed turning from your back to your side while in a flat bed without using bedrails?: None Help needed moving from lying on your back to sitting on the side of a flat bed without using bedrails?: None Help needed moving to and from a bed to a chair (including a wheelchair)?: A Little Help needed standing up from a chair using your arms (e.g., wheelchair or bedside chair)?: None Help needed to walk in hospital room?: A Little Help needed climbing 3-5 steps with a railing? : A Little 6 Click Score: 21    End of Session Equipment Utilized During Treatment: Oxygen Activity Tolerance: Patient tolerated treatment well Patient left: in bed;with call bell/phone within reach;with family/visitor present Nurse Communication: Mobility status PT Visit Diagnosis: Unsteadiness on feet (R26.81);Other abnormalities of gait and mobility (R26.89);Muscle weakness (generalized) (M62.81);Difficulty in walking, not elsewhere classified (R26.2)     Time: 1610-9604 PT Time Calculation (min) (ACUTE ONLY): 22 min  Charges:    $Gait Training: 8-22 mins PT General Charges $$ ACUTE PT VISIT: 1 Visit                     Benjamim Harnish, PT, GCS 10/11/23,3:25 PM

## 2023-10-11 NOTE — Progress Notes (Signed)
 Progress Note   Patient: Kathryn Wise QMV:[id number] DOB: 28-Sep-1929 DOA: 10/08/2023     3 DOS: the patient was seen and examined on 10/11/2023      Brief hospital course: Kathryn Wise is a 89 y.o. female with medical history significant of HOCM, dCHF, HTN, HLD, CAD, hypothyroidism, depression, anemia, A-fib not on anticoagulants, LBBB, subdural hematoma, dysphagia, carotid artery stenosis, renal artery stenosis (s/p of renal artery stent placement), skin cancer, hyponatremia, who presents with shortness of breath. Patient was recently hospitalized from 2/6 - 3/3 due to T12 burst fracture, UTI and rectus sheath hematoma and subsequently discharged to rehab.  Patient currently being managed for influenza A infection with underlying pneumonia and CHF exacerbation.I   Assessment and Plan:  Acute bronchitis due to influenza A infection:  Chronic hypoxic respiratory failure Patient uses 2 L of oxygen at home within the last 1 week prior to presentation -Continue bronchodilators and prn Mucinex Continue oral steroid Continue incentive spirometer Wean off oxygen as tolerated   Chronic diastolic CHF (congestive heart failure) [facility]): Patient has trace leg edema, but has elevated BNP 763, chest x-ray showed pulmonary edema, indicating fluid overload. Monitor daily weight Input and output monitoring Continue Lasix   CAD (coronary atherosclerotic disease) and myocardial injury: trop  76 --> 73.  Likely due to demand ischemia.  Patient has positive D-dimer and pleuritic chest pain, need to rule out PE. -Patient was given 324 mg of aspirin in ED.  Will hold further aspirin due to recent history of rectus sheath hematoma Continue Crestor CT angio ruled out PE   Hyponatremia: Sodium 128 on presentation Continue salt tablets as well as fluid restriction   Hyperlipidemia LDL goal <130 Continue Crestor   Labile hypertension: Blood pressure 157/68 -Hold amlodipine -Continue Zebeta -IV  hydralazine as needed   Hypothyroidism Continue Synthroid   HOCM (hypertrophic obstructive cardiomyopathy) (HCC) -Continue Zebeta   Recent T12 burst fracture (HCC) Continue as needed Robaxin and Norco   Recent rectus sheath hematoma Holding aspirin   Normocytic anemia: Monitor CBC closely   Positive D dimer CT scan of the chest ruled out PE Doppler ultrasound did not show DVT   Wide-complex tachycardia that is currently rate controlled I have discussed with cardiology team and amiodarone drip have been discontinued We will continue patient's home dose of bisoprolol Patient will follow-up with cardiologist as an outpatient     DVT ppx: SCD   Code Status: DNR per pt and her granddaughter    Family Communication:     Patient's daughter at bedside       Subjective:  Patient admits to improvement in respiratory function Denies chest pain nausea vomiting or abdominal pain Still on 1.5 L of oxygen Patient has been reevaluated by PT and now recommending home health TOC having discussion with patient's family   Physical Exam: General: Not in acute distress Heme: No neck lymph node enlargement. Cardiac: S1/S2, frequent premature beat, No gallops or rubs. Respiratory: has wheezing bilaterally GI: Soft, nondistended, nontender, no rebound pain Ext: has trace leg edema bilaterally. 1+DP/PT pulse bilaterally. Musculoskeletal: No joint deformities Skin: No rashes.  Neuro: Alert, oriented X3, cranial nerves II-XII grossly intact Psych: Patient is not psychotic, no suicidal or hemocidal ideation.   Data Reviewed:      Latest Ref Rng & Units 10/11/2023    4:59 AM 10/10/2023    5:25 AM 10/09/2023    3:31 AM  CBC  WBC 4.0 - 10.5 K/uL 6.6  5.2  5.1   Hemoglobin 12.0 - 15.0 g/dL 8.0  8.2  8.5   Hematocrit 36.0 - 46.0 % 25.9  26.6  27.5   Platelets 150 - 400 K/uL 185  159  205        Latest Ref Rng & Units 10/11/2023    4:59 AM 10/10/2023    5:25 AM 10/09/2023   10:52 AM   BMP  Glucose 70 - 99 mg/dL 409  811  914   BUN 8 - 23 mg/dL 13  13  12    Creatinine 0.44 - 1.00 mg/dL 7.82  9.56  2.13   Sodium 135 - 145 mmol/L 135  133  133   Potassium 3.5 - 5.1 mmol/L 4.0  4.4  3.5   Chloride 98 - 111 mmol/L 99  96  96   CO2 22 - 32 mmol/L 30  29  26    Calcium 8.9 - 10.3 mg/dL 8.4  8.3  7.8       Vitals:   10/11/23 1201 10/11/23 1248 10/11/23 1333 10/11/23 1551  BP: 134/73   (!) 155/70  Pulse: 74   71  Resp:  20 17   Temp: 98 F (36.7 C)   98.1 F (36.7 C)  TempSrc:      SpO2: 98%   97%  Weight:        Author: Loyce Dys, MD 10/11/2023 5:19 PM  For on call review [email or web address].

## 2023-10-11 NOTE — Progress Notes (Signed)
   10/11/23 1515  Spiritual Encounters  Type of Visit Initial  Care provided to: Family (Daughter is at bedside "Debbie")  Referral source Chaplain assessment  Reason for visit Routine spiritual support  OnCall Visit No  Spiritual Framework  Presenting Themes Meaning/purpose/sources of inspiration;Goals in life/care;Values and beliefs;Impactful experiences and emotions;Courage hope and growth;Community and relationships (for Family)  Interventions  Spiritual Care Interventions Made Established relationship of care and support;Compassionate presence;Reflective listening;Narrative/life review;Encouragement;Other (comment) (for Daughter)  Intervention Outcomes  Outcomes Connection to spiritual care;Awareness of support;Other (comment) (For Daughter)

## 2023-10-12 DIAGNOSIS — J101 Influenza due to other identified influenza virus with other respiratory manifestations: Secondary | ICD-10-CM | POA: Diagnosis not present

## 2023-10-12 LAB — BASIC METABOLIC PANEL
Anion gap: 6 (ref 5–15)
BUN: 16 mg/dL (ref 8–23)
CO2: 32 mmol/L (ref 22–32)
Calcium: 8.3 mg/dL — ABNORMAL LOW (ref 8.9–10.3)
Chloride: 97 mmol/L — ABNORMAL LOW (ref 98–111)
Creatinine, Ser: 0.73 mg/dL (ref 0.44–1.00)
GFR, Estimated: >60 mL/min (ref >60)
Glucose, Bld: 108 mg/dL — ABNORMAL HIGH (ref 70–99)
Potassium: 3.8 mmol/L (ref 3.5–5.1)
Sodium: 135 mmol/L (ref 135–145)

## 2023-10-12 LAB — CBC WITH DIFFERENTIAL/PLATELET
Abs Immature Granulocytes: 0.05 10*3/uL (ref 0.00–0.07)
Basophils Absolute: 0 10*3/uL (ref 0.0–0.1)
Basophils Relative: 0 %
Eosinophils Absolute: 0 10*3/uL (ref 0.0–0.5)
Eosinophils Relative: 0 %
HCT: 26.8 % — ABNORMAL LOW (ref 36.0–46.0)
Hemoglobin: 8.3 g/dL — ABNORMAL LOW (ref 12.0–15.0)
Immature Granulocytes: 1 %
Lymphocytes Relative: 41 %
Lymphs Abs: 3.3 10*3/uL (ref 0.7–4.0)
MCH: 26.8 pg (ref 26.0–34.0)
MCHC: 31 g/dL (ref 30.0–36.0)
MCV: 86.5 fL (ref 80.0–100.0)
Monocytes Absolute: 0.7 10*3/uL (ref 0.1–1.0)
Monocytes Relative: 9 %
Neutro Abs: 3.9 10*3/uL (ref 1.7–7.7)
Neutrophils Relative %: 49 %
Platelets: 201 10*3/uL (ref 150–400)
RBC: 3.1 MIL/uL — ABNORMAL LOW (ref 3.87–5.11)
RDW: 18.3 % — ABNORMAL HIGH (ref 11.5–15.5)
WBC: 7.9 10*3/uL (ref 4.0–10.5)
nRBC: 0 % (ref 0.0–0.2)

## 2023-10-12 MED ORDER — AMLODIPINE BESYLATE 5 MG PO TABS
5.0000 mg | ORAL_TABLET | Freq: Every day | ORAL | Status: DC
  Administered 2023-10-12: 5 mg via ORAL
  Filled 2023-10-12: qty 1

## 2023-10-12 MED ORDER — AMOXICILLIN-POT CLAVULANATE 875-125 MG PO TABS: 1.0000 | ORAL_TABLET | Freq: Two times a day (BID) | ORAL | 0 refills | Status: AC

## 2023-10-12 MED ORDER — SODIUM CHLORIDE 1 G PO TABS
1.0000 g | ORAL_TABLET | Freq: Two times a day (BID) | ORAL | 0 refills | Status: DC
Start: 2023-10-12 — End: 2023-10-22

## 2023-10-12 MED ORDER — IPRATROPIUM-ALBUTEROL 0.5-2.5 (3) MG/3ML IN SOLN
3.0000 mL | Freq: Three times a day (TID) | RESPIRATORY_TRACT | Status: DC
  Administered 2023-10-12: 3 mL via RESPIRATORY_TRACT
  Filled 2023-10-12: qty 3

## 2023-10-12 MED ORDER — OSELTAMIVIR PHOSPHATE 30 MG PO CAPS: 30.0000 mg | ORAL_CAPSULE | Freq: Two times a day (BID) | ORAL | 0 refills | Status: AC

## 2023-10-12 NOTE — Plan of Care (Signed)
  Problem: Education: Goal: Knowledge of General Education information will improve Description: Including pain rating scale, medication(s)/side effects and non-pharmacologic comfort measures Outcome: Progressing   Problem: Clinical Measurements: Goal: Ability to maintain clinical measurements within normal limits will improve Outcome: Progressing Goal: Diagnostic test results will improve Outcome: Progressing Goal: Respiratory complications will improve Outcome: Progressing Goal: Cardiovascular complication will be avoided Outcome: Progressing   Problem: Activity: Goal: Risk for activity intolerance will decrease Outcome: Progressing   Problem: Coping: Goal: Level of anxiety will decrease Outcome: Progressing   Problem: Pain Managment: Goal: General experience of comfort will improve and/or be controlled Outcome: Progressing   Problem: Safety: Goal: Ability to remain free from injury will improve Outcome: Progressing

## 2023-10-12 NOTE — Progress Notes (Signed)
 SATURATION QUALIFICATIONS: (This note is used to comply with regulatory documentation for home oxygen)  Patient Saturations on Room Air at Rest = 94%  Patient Saturations on Room Air while Ambulating = 86%  Patient Saturations on 1 Liters of oxygen while Ambulating = 91%  Please briefly explain why patient needs home oxygen:

## 2023-10-12 NOTE — Discharge Summary (Addendum)
 Physician Discharge Summary   Patient: Kathryn Wise MRN: [id number] DOB: May 30, 1930  Admit date:     10/08/2023  Discharge date: 10/12/23  Discharge Physician: Loyce Dys   PCP: Enid Baas, MD   Recommendations at discharge:  Follow-up with your cardiologist  Discharge Diagnoses: Acute bronchitis due to influenza A infection:  Chronic diastolic CHF (congestive heart failure) (HCC): CAD (coronary atherosclerotic disease) and myocardial injury:  Hyponatremia:  Hyperlipidemia LDL goal <130 Labile hypertension:  Hypothyroidism HOCM (hypertrophic obstructive cardiomyopathy) (HCC) Recent T12 burst fracture (HCC) Recent rectus sheath hematoma Normocytic anemia: Positive D dimer Wide-complex tachycardia that is currently rate controlled  Hospital Course: Kathryn Wise is a 89 y.o. female with medical history significant of HOCM, dCHF, HTN, HLD, CAD, hypothyroidism, depression, anemia, A-fib not on anticoagulants, LBBB, subdural hematoma, dysphagia, carotid artery stenosis, renal artery stenosis (s/p of renal artery stent placement), skin cancer, hyponatremia, who presents with shortness of breath. Patient was recently hospitalized from 2/6 - 3/3 due to T12 burst fracture, UTI and rectus sheath hematoma and subsequently discharged to rehab.  Patient currently being managed for influenza A infection with underlying pneumonia and CHF exacerbation.  Patient condition improved and was seen by physical therapist with recommendation for home health.  This was discussed with patient as well as patient's daughter at bedside who have agreed.  They will follow-up with your cardiologist.  Consultants: Cardiology Procedures performed: None Disposition: Home health Diet recommendation:  Cardiac diet DISCHARGE MEDICATION: Allergies as of 10/12/2023       Reactions   Alendronate Swelling   And nausea    Codeine    Lisinopril    Spike in BP   Metoprolol Other (See Comments)   Lethargy  - PT CURRENTLY TAKING    Oxycodone    Other reaction(s): Other (See Comments) Altered mental status   Oxycontin [oxycodone Hcl]    Altered mental status        Medication List     STOP taking these medications    aspirin EC 81 MG tablet   doxycycline 100 MG capsule Commonly known as: VIBRAMYCIN       TAKE these medications    albuterol 108 (90 Base) MCG/ACT inhaler Commonly known as: VENTOLIN HFA Inhale into the lungs.   amLODipine 5 MG tablet Commonly known as: NORVASC Take 1 tablet (5 mg total) by mouth as needed (Once daily AS NEEDED for SBP (top blood pressure number) is greater than 160.).   amoxicillin-clavulanate 875-125 MG tablet Commonly known as: AUGMENTIN Take 1 tablet by mouth 2 (two) times daily for 3 days.   benzonatate 200 MG capsule Commonly known as: TESSALON Take 200 mg by mouth 3 (three) times daily as needed for cough.   bisacodyl 5 MG EC tablet Commonly known as: DULCOLAX Take 1 tablet (5 mg total) by mouth daily as needed for moderate constipation.   bisoprolol 5 MG tablet Commonly known as: ZEBETA Take 1 tablet (5 mg total) by mouth every other day.   cyanocobalamin 1000 MCG tablet Commonly known as: VITAMIN B12 Take 1,000 mcg by mouth daily.   docusate sodium 100 MG capsule Commonly known as: COLACE Take 1 capsule (100 mg total) by mouth 2 (two) times daily.   HYDROcodone-acetaminophen 5-325 MG tablet Commonly known as: NORCO/VICODIN Take 1-2 tablets by mouth every 4 (four) hours as needed for moderate pain (pain score 4-6).   ipratropium-albuterol 0.5-2.5 (3) MG/3ML Soln Commonly known as: DUONEB Inhale into the lungs.   lactulose 10  GM/15ML solution Commonly known as: CHRONULAC Take 45 mLs (30 g total) by mouth 2 (two) times daily as needed for mild constipation.   levothyroxine 75 MCG tablet Commonly known as: SYNTHROID Take 75 mcg by mouth daily.   lidocaine 5 % Commonly known as: LIDODERM Place 1-3 patches onto  the skin daily. Remove & Discard patch within 12 hours or as directed by MD   nitroGLYCERIN 0.4 MG SL tablet Commonly known as: NITROSTAT Place 1 tablet (0.4 mg total) under the tongue every 5 (five) minutes as needed for chest pain.   omeprazole 20 MG capsule Commonly known as: PRILOSEC TAKE 1 CAPSULE(20 MG) BY MOUTH EVERY MORNING AS NEEDED FOR HEARTBURN   ondansetron 4 MG disintegrating tablet Commonly known as: ZOFRAN-ODT Take 4 mg by mouth every 8 (eight) hours as needed.   oseltamivir 30 MG capsule Commonly known as: TAMIFLU Take 1 capsule (30 mg total) by mouth 2 (two) times daily for 2 days.   polyethylene glycol 17 g packet Commonly known as: MIRALAX / GLYCOLAX Take 17 g by mouth daily.   predniSONE 20 MG tablet Commonly known as: DELTASONE Take 20 mg by mouth 2 (two) times daily.   rosuvastatin 10 MG tablet Commonly known as: CRESTOR Take 1 tablet (10 mg total) by mouth at bedtime.   sodium chloride 1 g tablet Take 1 tablet (1 g total) by mouth 2 (two) times daily with a meal for 10 days.   temazepam 15 MG capsule Commonly known as: RESTORIL Take 1 capsule (15 mg total) by mouth at bedtime as needed for sleep.   Turmeric 500 MG Caps Take 500 mg by mouth daily.   Vitamin D-3 25 MCG (1000 UT) Caps Take 1,000 Units by mouth daily.   Xiidra 5 % Soln Generic drug: Lifitegrast Apply 1 drop to eye 2 (two) times daily.        Discharge Exam: Filed Weights   10/11/23 0512 10/12/23 0512  Weight: 76 kg 64.6 kg   General: Not in acute distress Heme: No neck lymph node enlargement. Cardiac: S1/S2, no Mamma Respiratory: Wheezing resolved GI: Soft, nondistended, nontender, no rebound pain Ext: has trace leg edema bilaterally. 1+DP/PT pulse bilaterally. Musculoskeletal: No joint deformities Skin: No rashes.  Neuro: Alert, oriented X3, cranial nerves II-XII grossly intact Psych: Patient is not psychotic, no suicidal or hemocidal ideation.    Condition at  discharge: good  The results of significant diagnostics from this hospitalization (including imaging, microbiology, ancillary and laboratory) are listed below for reference.   Imaging Studies: US Venous Img Lower Bilateral (DVT) Result Date: 10/09/2023 CLINICAL DATA:  Positive D-dimer EXAM: BILATERAL LOWER EXTREMITY VENOUS DOPPLER ULTRASOUND TECHNIQUE: Gray-scale sonography with compression, as well as color and duplex ultrasound, were performed to evaluate the deep venous system(s) from the level of the common femoral vein through the popliteal and proximal calf veins. COMPARISON:  None Available. FINDINGS: VENOUS Normal compressibility of the common femoral, superficial femoral, and popliteal veins, as well as the visualized calf veins. Visualized portions of profunda femoral vein and great saphenous vein unremarkable. No filling defects to suggest DVT on grayscale or color Doppler imaging. Doppler waveforms show normal direction of venous flow, normal respiratory plasticity and response to augmentation. OTHER None. Limitations: none IMPRESSION: Negative. Electronically Signed   By: Helyn Numbers M.D.   On: 10/09/2023 00:47   CT Angio Chest PE W and/or Wo Contrast Result Date: 10/08/2023 CLINICAL DATA:  Pulmonary embolism (PE) suspected, high prob cardiologist and PA reported  lungs were "junky" and concerned for infection. PA reported pt is pale and confused. Pt is on 2L [place] at baseline. Pt reports SOB. EXAM: CT ANGIOGRAPHY CHEST WITH CONTRAST TECHNIQUE: Multidetector CT imaging of the chest was performed using the standard protocol during bolus administration of intravenous contrast. Multiplanar CT image reconstructions and MIPs were obtained to evaluate the vascular anatomy. RADIATION DOSE REDUCTION: This exam was performed according to the departmental dose-optimization program which includes automated exposure control, adjustment of the mA and/or kV according to patient size and/or use of iterative  reconstruction technique. CONTRAST:  75mL OMNIPAQUE IOHEXOL 350 MG/ML SOLN COMPARISON:  MRI lumbar spine 09/25/2023 FINDINGS: Cardiovascular: Satisfactory opacification of the pulmonary arteries to the segmental level. No evidence of pulmonary embolism. Mildly enlarged heart size. No significant pericardial effusion. The thoracic aorta is normal in caliber. Severe atherosclerotic plaque of the thoracic aorta. Four-vessel coronary artery calcifications. Aortic valve leaflet calcifications. Mitral annular calcification. Mediastinum/Nodes: No enlarged mediastinal, hilar, or axillary lymph nodes. Thyroid gland, trachea, and esophagus demonstrate no significant findings. Moderate volume hiatal hernia. Lungs/Pleura: Mild paramediastinal right architectural distortion and bronchiectasis. Moderate centrilobular emphysematous changes. Biapical pleural/pulmonary scarring. Patchy peribronchovascular and subpleural ground-glass airspace opacity. No pulmonary nodule. No pulmonary mass. Small left and trace to small right pleural effusions. No pneumothorax. Upper Abdomen: No acute abnormality. Musculoskeletal: No chest wall abnormality. No suspicious lytic or blastic osseous lesions. Redemonstration a known subacute T12 complete burst fracture with at least 60% vertebral body height loss and 4 mm retropulsion into the central canal. No acute displaced fracture. Multilevel degenerative changes of the spine. Partially visualized total left shoulder arthroplasty. Review of the MIP images confirms the above findings. IMPRESSION: 1. No pulmonary embolus. 2. Patchy peribronchovascular and subpleural ground-glass airspace opacity. Findings suggestive of infection/inflammation. 3. Small left pleural effusion. 4. Trace to small right pleural effusions. 5. Moderate volume hiatal hernia. 6. Aortic Atherosclerosis (ICD10-I70.0) including mitral annular, four-vessel coronary artery, and aortic valve leaflet calcification-correlate for aortic  stenosis. 7. Known subacute T12 complete burst fracture with at least 60% vertebral body height loss and 4 mm retropulsion into the central canal. Electronically Signed   By: Tish Frederickson M.D.   On: 10/08/2023 21:28   DG Chest 2 View Result Date: 10/08/2023 CLINICAL DATA:  Chest pain. EXAM: CHEST - 2 VIEW COMPARISON:  09/22/2023. FINDINGS: Mild diffuse pulmonary vascular congestion. There is small right and small-to-moderate left pleural effusion. There are probable compressive atelectatic changes at the lung bases, left more than right. No dense consolidation or lung collapse. No pneumothorax. Stable cardio-mediastinal silhouette. No acute osseous abnormalities.  Left shoulder arthroplasty noted. The soft tissues are within normal limits. IMPRESSION: *Findings favor congestive heart failure/pulmonary edema. Electronically Signed   By: Jules Schick M.D.   On: 10/08/2023 18:47   DG Abd 1 View Result Date: 09/27/2023 CLINICAL DATA:  Constipation EXAM: ABDOMEN - 1 VIEW COMPARISON:  CT 09/25/2023 FINDINGS: The heart appears enlarged. Mild diffuse increased colon gas. Only mild stool in the right colon. No dilated small bowel. Pelvic phleboliths. IMPRESSION: Nonobstructed gas pattern with mild stool in the right colon Electronically Signed   By: Jasmine Pang M.D.   On: 09/27/2023 16:39   MR LUMBAR SPINE WO CONTRAST Result Date: 09/25/2023 CLINICAL DATA:  Initial evaluation for acute trauma. EXAM: MRI LUMBAR SPINE WITHOUT CONTRAST TECHNIQUE: Multiplanar, multisequence MR imaging of the lumbar spine was performed. No intravenous contrast was administered. COMPARISON:  CT from earlier the same day. FINDINGS: Segmentation: Standard. Lowest well-formed  disc space labeled the L5-S1 level. Alignment: Trace dextroscoliosis. 3 mm anterolisthesis of L4 on L5, with trace 2 mm retrolisthesis of L1 on L2 and L2 on L3. Findings chronic and facet mediated. Vertebrae: Acute burst type compression fracture involving the  T12 vertebral body with 30% height loss and trace 2-3 mm bony retropulsion. No stenosis. Vertebral body height otherwise maintained without acute or chronic fracture. Bone marrow signal intensity within normal limits. No worrisome osseous lesions. Mild reactive marrow edema present about the right greater than left L5-S1 facets due to facet arthritis. Conus medullaris and cauda equina: Conus extends to the L1 level. Conus and cauda equina appear normal. Paraspinal and other soft tissues: Unremarkable. Disc levels: L1-2: Disc desiccation with diffuse disc bulge. Reactive endplate spurring. Superimposed tiny central disc protrusion. Mild bilateral facet spurring. No spinal stenosis. Foramina remain patent. L2-3: Degenerate intervertebral disc space narrowing with disc desiccation and diffuse disc bulge. Reactive endplate spurring. No significant spinal stenosis. Foramina remain adequately patent. L3-4: Mild degenerative disc space narrowing with disc desiccation and diffuse disc bulge. Reactive endplate spurring, greater on the left. Mild bilateral facet hypertrophy. No significant spinal stenosis. Mild right with mild to moderate left L3 foraminal stenosis. L4-5: Disc desiccation with diffuse disc bulge. Moderate facet and ligament flavum hypertrophy. Associated small joint effusions. Resultant mild narrowing of the lateral recesses bilaterally. Central canal remains patent. No significant foraminal stenosis. L5-S1: Disc desiccation with mild disc bulge. Disc bulging slightly asymmetric to the right. Right-sided reactive endplate spurring. Moderate right worse than left facet arthrosis with reactive marrow edema and small joint effusions. Mild narrowing of the lateral recesses bilaterally. Central canal remains patent. Mild to moderate right L5 foraminal stenosis. Left neural foramina remains patent. IMPRESSION: 1. Acute burst type compression fracture involving the T12 vertebral body with 30% height loss and trace  2-3 mm bony retropulsion. No significant stenosis. 2. No other acute traumatic injury within the lumbar spine. 3. Mild to moderate left L3 and right L5 foraminal stenosis related to disc bulge and facet hypertrophy. 4. Moderate right worse than left facet arthrosis at L5-S1 with associated reactive marrow edema. Finding could contribute to lower back pain. Electronically Signed   By: Rise Mu M.D.   On: 09/25/2023 23:28   MR THORACIC SPINE WO CONTRAST Result Date: 09/25/2023 CLINICAL DATA:  Initial evaluation for acute trauma, abnormal CT. EXAM: MRI THORACIC SPINE WITHOUT CONTRAST TECHNIQUE: Multiplanar, multisequence MR imaging of the thoracic spine was performed. No intravenous contrast was administered. COMPARISON:  CT from earlier the same day. FINDINGS: Alignment: Vertebral bodies normally aligned with preservation of the normal thoracic kyphosis. No listhesis. Vertebrae: Acute burst type compression fracture involving the T12 vertebral body again seen, corresponding with abnormality on prior CT. Associated central height loss measures up to 30% with trace 2-3 mm bony retropulsion. No stenosis. This is benign/mechanical in appearance. Vertebral body height otherwise maintained with no other acute or chronic fracture. Bone marrow signal intensity within normal limits. No discrete or worrisome osseous lesions. No other abnormal marrow edema. Cord:  Normal signal and morphology. Paraspinal and other soft tissues: Mild edema seen within the paraspinous soft tissues adjacent to the T12 fracture. Major ligamentous structures appear intact. Moderate layering bilateral pleural effusions, right greater than left. Aortic atherosclerosis. Disc levels: No significant disc pathology seen within the thoracic spine for age. No spinal stenosis. Foramina remain patent. IMPRESSION: 1. Acute burst type compression fracture involving the T12 vertebral body with up to 30% height loss and  trace 2-3 mm bony  retropulsion. No significant stenosis. This is benign/mechanical in appearance. 2. No other acute traumatic injury within the thoracic spine. 3. Moderate layering bilateral pleural effusions, right greater than left. 4.  Aortic Atherosclerosis (ICD10-I70.0). Electronically Signed   By: Rise Mu M.D.   On: 09/25/2023 23:22   CT ABDOMEN PELVIS W CONTRAST Result Date: 09/25/2023 CLINICAL DATA:  Abdominal pain EXAM: CT ABDOMEN AND PELVIS WITH CONTRAST TECHNIQUE: Multidetector CT imaging of the abdomen and pelvis was performed using the standard protocol following bolus administration of intravenous contrast. RADIATION DOSE REDUCTION: This exam was performed according to the departmental dose-optimization program which includes automated exposure control, adjustment of the mA and/or kV according to patient size and/or use of iterative reconstruction technique. CONTRAST:  OMNIPAQUE IOHEXOL 300 MG/ML  SOLN COMPARISON:  09/22/2023 FINDINGS: Lower chest: Trace bilateral pleural effusions. Dependent airspace consolidation in the left lower lobe. Moderate hiatal hernia. Hepatobiliary: No focal liver abnormality is seen. No gallstones, gallbladder wall thickening, or biliary dilatation. Pancreas: No new or acute abnormality. Spleen: Normal in size without focal abnormality. Adrenals/Urinary Tract: Adrenal glands are unremarkable. Kidneys are normal, without renal calculi, focal lesion, or hydronephrosis. Small amount of air within the urinary bladder. No appreciable bladder wall thickening. Stomach/Bowel: Moderate hiatal hernia. Stomach otherwise within normal limits. No evidence of bowel obstruction or active bowel inflammation. Moderate volume stool within the colon. Vascular/Lymphatic: Aortic atherosclerosis. No enlarged abdominal or pelvic lymph nodes. Reproductive: Fibroid uterus.  No adnexal masses. Other: No ascites or pneumoperitoneum. Musculoskeletal: Redemonstrated left rectus sheath hematoma  measuring approximately 12.2 x 4.8 x 8.0 cm (previously 12.2 x 6.3 x 7.6 cm). Acute burst-type fracture of the T12 vertebral body with up to 40% vertebral body height loss. Approximately 3 mm of bony retropulsion. No evidence of fracture extension into the posterior elements. IMPRESSION: 1. Acute burst-type fracture of the T12 vertebral body with up to 40% vertebral body height loss. Approximately 3 mm of bony retropulsion. This is new from the prior study obtained 3 days ago. 2. Grossly stable appearance of left rectus sheath hematoma measuring approximately 12.2 x 4.8 x 8.0 cm (previously 12.2 x 6.3 x 7.6 cm). 3. Trace bilateral pleural effusions. Dependent airspace consolidation in the left lower lobe, which may represent atelectasis or pneumonia. 4. Small amount of air within the urinary bladder. Correlate for recent instrumentation. 5. Moderate hiatal hernia. 6. Fibroid uterus. 7. Aortic atherosclerosis (ICD10-I70.0). Electronically Signed   By: Duanne Guess D.O.   On: 09/25/2023 16:23   CT HEAD WO CONTRAST ( ) Result Date: 09/23/2023 CLINICAL DATA:  Provided history: Syncope/presyncope, cerebrovascular cause suspected. Additional history provided: Fall (with head trauma). EXAM: CT HEAD WITHOUT CONTRAST TECHNIQUE: Contiguous axial images were obtained from the base of the skull through the vertex without intravenous contrast. RADIATION DOSE REDUCTION: This exam was performed according to the departmental dose-optimization program which includes automated exposure control, adjustment of the mA and/or kV according to patient size and/or use of iterative reconstruction technique. COMPARISON:  Head CT 02/18/2023. FINDINGS: Brain: Mild generalized cerebral atrophy. Patchy and ill-defined hypoattenuation within the cerebral white matter, nonspecific but compatible with mild chronic small vessel ischemic disease. Small chronic infarcts again demonstrated within the left cerebellar hemisphere. There is no  acute intracranial hemorrhage. No demarcated cortical infarct. No extra-axial fluid collection. No evidence of an intracranial mass. No midline shift. Vascular: No hyperdense vessel.  Atherosclerotic calcifications. Skull: No calvarial fracture or aggressive osseous lesion. Sinuses/Orbits: No mass or acute finding  within the imaged orbits. No significant paranasal sinus disease. IMPRESSION: 1. No evidence of an acute intracranial abnormality. 2. Mild cerebral white matter chronic small vessel ischemic disease. 3. Unchanged small chronic infarcts within left cerebellar hemisphere. 4. Mild cerebral atrophy. Electronically Signed   By: Jackey Loge D.O.   On: 09/23/2023 13:34   CT ABDOMEN PELVIS W CONTRAST Result Date: 09/22/2023 CLINICAL DATA:  89 year old female with sudden onset severe left lower quadrant pain. EXAM: CT ABDOMEN AND PELVIS WITH CONTRAST TECHNIQUE: Multidetector CT imaging of the abdomen and pelvis was performed using the standard protocol following bolus administration of intravenous contrast. RADIATION DOSE REDUCTION: This exam was performed according to the departmental dose-optimization program which includes automated exposure control, adjustment of the mA and/or kV according to patient size and/or use of iterative reconstruction technique. CONTRAST:  OMNIPAQUE IOHEXOL 300 MG/ML  SOLN COMPARISON:  CT Abdomen and Pelvis 03/02/2023. FINDINGS: Lower chest: Stable mild cardiomegaly. Moderate size gastric hiatal hernia is stable. Trace layering pleural effusions have regressed since last year, resolved on the left. Hyperinflated lung bases suspicious for underlying emphysema. There is new atelectasis in the medial segment of the right middle lobe. Hepatobiliary: Negative liver and gallbladder. Pancreas: Stable 1 cm pancreatic head cystic mass series 2 image 30 (no follow up imaging recommended in this age group). Spleen: Negative. Adrenals/Urinary Tract: Normal adrenal glands. Nonobstructed  kidneys with normal renal enhancement and contrast excretion. Nondilated ureters. Early urine contrast excretion to the bladder. Chronic pelvic phleboliths. Hematoma in the anterior space of Retzius, but otherwise negative urinary bladder. See additional details below. Stomach/Bowel: Redundant large bowel with mild diverticulosis and mild retained stool. Normal appendix on series 2, image 63. No dilated small bowel. Decompressed intra-abdominal portion of the stomach. Decompressed duodenum. No free air or free fluid. Vascular/Lymphatic: Extensive Aortoiliac calcified atherosclerosis. Major arterial structures and portal venous system are patent. No lymphadenopathy. No retroperitoneal space hemorrhage. Reproductive: Stable and within normal limits. Other: No pelvic free fluid. Musculoskeletal: Chronic osteopenia. Stable vertebral height and alignment with minimal scoliosis. Sacrum and SI joints appear stable and intact. No acute osseous abnormality identified. Left lower abdominal wall large rectus intramuscular hemorrhage is new since August and there is a crescent shaped 2 cm area of contrast extravasation into the medial muscle on coronal image 29. The expanded lower rectus muscle encompasses about 63 x 76 x 122 mm (AP by transverse by CC) for hematoma volume of probably 100-200 mL. Surrounding inflammation in the ventral abdominal wall and thickened underlying ventral peritoneal lining. Hematoma is tracking into the space of Retzius at the symphysis. IMPRESSION: 1. Acute Left Lower Rectus Muscle Hemorrhage with active intramuscular bleeding (positive contrast extravasation). Expansion of the muscle and early extension of hematoma into the space of Retzius. Hematoma volume estimated at 100-200 mL. 2. No retroperitoneal hemorrhage at this time. No other acute or inflammatory process identified in the abdomen or pelvis. 3. Small pleural effusions regressed since August, trace on the right. 4. Multiple chronic  findings as above. Electronically Signed   By: Odessa Fleming M.D.   On: 09/22/2023 06:42   DG Chest Port 1 View Result Date: 09/22/2023 CLINICAL DATA:  Left lower quadrant pain. EXAM: PORTABLE CHEST 1 VIEW COMPARISON:  Portable chest 03/01/2023 FINDINGS: Stable moderate cardiomegaly.  No vascular congestion or edema. Stable mediastinum. The aorta is tortuous and calcified. The lungs are clear. No new osseous findings.  Osteopenia.  Left shoulder arthroplasty. IMPRESSION: No evidence of acute chest disease. Stable cardiomegaly. Aortic  atherosclerosis. Electronically Signed   By: Almira Bar M.D.   On: 09/22/2023 06:07    Microbiology: Results for orders placed or performed during the hospital encounter of 10/08/23  Resp panel by RT-PCR (RSV, Flu A&B, Covid) Anterior Nasal Swab     Status: Abnormal   Collection Time: 10/08/23  5:16 PM   Specimen: Anterior Nasal Swab  Result Value Ref Range Status   SARS Coronavirus 2 by RT PCR NEGATIVE NEGATIVE Final    Comment: (NOTE) SARS-CoV-2 target nucleic acids are NOT DETECTED.  The SARS-CoV-2 RNA is generally detectable in upper respiratory specimens during the acute phase of infection. The lowest concentration of SARS-CoV-2 viral copies this assay can detect is 138 copies/mL. A negative result does not preclude SARS-Cov-2 infection and should not be used as the sole basis for treatment or other patient management decisions. A negative result may occur with  improper specimen collection/handling, submission of specimen other than nasopharyngeal swab, presence of viral mutation(s) within the areas targeted by this assay, and inadequate number of viral copies(<138 copies/mL). A negative result must be combined with clinical observations, patient history, and epidemiological information. The expected result is Negative.  Fact Sheet for Patients:  BloggerCourse.com  Fact Sheet for Healthcare Providers:   SeriousBroker.it  This test is no t yet approved or cleared by the [place] FDA and  has been authorized for detection and/or diagnosis of SARS-CoV-2 by FDA under an Emergency Use Authorization (EUA). This EUA will remain  in effect (meaning this test can be used) for the duration of the COVID-19 declaration under Section 564(b)(1) of the Act, 21 U.S.C.section 360bbb-3(b)(1), unless the authorization is terminated  or revoked sooner.       Influenza A by PCR POSITIVE (A) NEGATIVE Final   Influenza B by PCR NEGATIVE NEGATIVE Final    Comment: (NOTE) The Xpert Xpress SARS-CoV-2/FLU/RSV plus assay is intended as an aid in the diagnosis of influenza from Nasopharyngeal swab specimens and should not be used as a sole basis for treatment. Nasal washings and aspirates are unacceptable for Xpert Xpress SARS-CoV-2/FLU/RSV testing.  Fact Sheet for Patients: BloggerCourse.com  Fact Sheet for Healthcare Providers: SeriousBroker.it  This test is not yet approved or cleared by the [place] FDA and has been authorized for detection and/or diagnosis of SARS-CoV-2 by FDA under an Emergency Use Authorization (EUA). This EUA will remain in effect (meaning this test can be used) for the duration of the COVID-19 declaration under Section 564(b)(1) of the Act, 21 U.S.C. section 360bbb-3(b)(1), unless the authorization is terminated or revoked.     Resp Syncytial Virus by PCR NEGATIVE NEGATIVE Final    Comment: (NOTE) Fact Sheet for Patients: BloggerCourse.com  Fact Sheet for Healthcare Providers: SeriousBroker.it  This test is not yet approved or cleared by the [place] FDA and has been authorized for detection and/or diagnosis of SARS-CoV-2 by FDA under an Emergency Use Authorization (EUA). This EUA will remain in effect (meaning this test can be used)  for the duration of the COVID-19 declaration under Section 564(b)(1) of the Act, 21 U.S.C. section 360bbb-3(b)(1), unless the authorization is terminated or revoked.  Performed at [facility], [address] [place], [place] [address]     Labs: CBC: Recent Labs  Lab 10/08/23 1517 10/09/23 0331 10/10/23 0525 10/11/23 0459 10/12/23 0253  WBC 5.5 5.1 5.2 6.6 7.9  NEUTROABS  --   --  2.4 3.0 3.9  HGB 8.8* 8.5* 8.2* 8.0* 8.3*  HCT 28.6* 27.5*  26.6* 25.9* 26.8*  MCV 88.0 86.5 87.5 87.2 86.5  PLT 210 205 159 185 201   Basic Metabolic Panel: Recent Labs  Lab 10/08/23 1716 10/09/23 0331 10/09/23 1052 10/10/23 0525 10/11/23 0459 10/12/23 0253  NA 129* 130* 133* 133* 135 135  K 4.5 3.4* 3.5 4.4 4.0 3.8  CL 93* 93* 96* 96* 99 97*  CO2 26 29 26 29 30  32  GLUCOSE 128* 183* 175* 145* 102* 108*  BUN 13 13 12 13 13 16   CREATININE 0.91 0.84 0.84 0.68 0.65 0.73  CALCIUM 8.4* 8.0* 7.8* 8.3* 8.4* 8.3*  MG 2.3  --   --   --   --   --   PHOS 2.8  --   --   --   --   --    Liver Function Tests: No results for input(s): "AST", "ALT", "ALKPHOS", "BILITOT", "PROT", "ALBUMIN" in the last 168 hours. CBG: No results for input(s): "GLUCAP" in the last 168 hours.  Discharge time spent:  37 minutes.  Signed: Loyce Dys, MD [facility] 10/12/2023    Addendum 10/12/2023 at 12 10 p.m.  Patient underwent a walk test with the following findings as shown below.  SATURATION QUALIFICATIONS:   Patient Saturations on Room Air at Rest = 94%   Patient Saturations on Room Air while Ambulating = 86%   Patient Saturations on 1 Liters of oxygen while Ambulating = 91%   Patient therefore meets criteria for home oxygen with ambulation

## 2023-10-12 NOTE — TOC Transition Note (Signed)
 Transition of Care [facility]) - Discharge Note   Patient Details  Name: Kathryn Wise MRN: [id number] Date of Birth: 22-Nov-1929  Transition of Care [facility]) CM/SW Contact:  Liliana Cline, LCSW Phone Number: 10/12/2023, 12:04 PM   Clinical Narrative:    CSW spoke with daughter Eunice Blase who is at bedside. Patient and daughter agreeable to DC with Home Health. No agency preference but do not want to use agency used last time (per [facility] this was [facility]). Referral accepted by Kandee Keen with [facility] for PT, OT, RN and Aide. Confirmed patient's home address. Daughter is aware HH only comes a few times per week and is also working on establishing private pay aide care at home as daughter lives out of state in [place]. RN did walk test and patient needs home o2. Patient and daughter requesting portable concentrator- explained will have to have POC eval once home, they are ok with portable tanks until this can be done at home. They also request a RW for home. Referral made to [facility] with [facility] for RW and o2, informed her of need ofr POC eval and asked MD to add this to oxygen orders.   Final next level of care: Home w Home Health Services Barriers to Discharge: Barriers Resolved   Patient Goals and CMS Choice   CMS Medicare.gov Compare Post Acute Care list provided to:: Patient Represenative (must comment) Choice offered to / list presented to : Adult Children      Discharge Placement                Patient to be transferred to facility by: daughter transporting home Name of family member notified: Debbie Patient and family notified of of transfer: 10/12/23  Discharge Plan and Services Additional resources added to the After Visit Summary for                  DME Arranged: Walker rolling, Oxygen DME Agency: [facility] Date DME Agency Contacted: 10/12/23   Representative spoke with at DME Agency: [facility] HH Arranged: PT, OT, RN, Nurse's Aide HH Agency: [facility] Date [facility]  Agency Contacted: 10/12/23   Representative spoke with at [facility] Agency: Kandee Keen  Social Drivers of Health (SDOH) Interventions SDOH Screenings   Food Insecurity: No Food Insecurity (10/10/2023)  Housing: Low Risk  (10/10/2023)  Transportation Needs: No Transportation Needs (10/10/2023)  Utilities: Not At Risk (10/10/2023)  Depression (PHQ2-9): Low Risk  (10/10/2021)  Recent Concern: Depression (PHQ2-9) - Medium Risk (09/26/2021)  Financial Resource Strain: Low Risk  (09/12/2023)   Received from [facility]  Physical Activity: Sufficiently Active (04/20/2020)  Social Connections: Moderately Isolated (10/10/2023)  Stress: No Stress Concern Present (04/20/2020)  Tobacco Use: Medium Risk (10/08/2023)     Readmission Risk Interventions     No data to display

## 2023-10-14 ENCOUNTER — Emergency Department

## 2023-10-14 ENCOUNTER — Encounter: Payer: Self-pay | Admitting: Emergency Medicine

## 2023-10-14 ENCOUNTER — Other Ambulatory Visit: Payer: Self-pay

## 2023-10-14 ENCOUNTER — Emergency Department
Admission: EM | Admit: 2023-10-14 | Discharge: 2023-10-14 | Disposition: A | Discharge status: Home / Self Care | Attending: Emergency Medicine | Admitting: Emergency Medicine

## 2023-10-14 DIAGNOSIS — I2510 Atherosclerotic heart disease of native coronary artery without angina pectoris: Secondary | ICD-10-CM | POA: Insufficient documentation

## 2023-10-14 DIAGNOSIS — J90 Pleural effusion, not elsewhere classified: Secondary | ICD-10-CM | POA: Diagnosis not present

## 2023-10-14 DIAGNOSIS — R110 Nausea: Secondary | ICD-10-CM | POA: Insufficient documentation

## 2023-10-14 DIAGNOSIS — R0902 Hypoxemia: Secondary | ICD-10-CM | POA: Diagnosis not present

## 2023-10-14 DIAGNOSIS — I110 Hypertensive heart disease with heart failure: Secondary | ICD-10-CM | POA: Insufficient documentation

## 2023-10-14 DIAGNOSIS — I447 Left bundle-branch block, unspecified: Secondary | ICD-10-CM | POA: Diagnosis not present

## 2023-10-14 DIAGNOSIS — I4891 Unspecified atrial fibrillation: Secondary | ICD-10-CM | POA: Insufficient documentation

## 2023-10-14 DIAGNOSIS — I5030 Unspecified diastolic (congestive) heart failure: Secondary | ICD-10-CM | POA: Insufficient documentation

## 2023-10-14 DIAGNOSIS — R531 Weakness: Secondary | ICD-10-CM | POA: Insufficient documentation

## 2023-10-14 DIAGNOSIS — I959 Hypotension, unspecified: Secondary | ICD-10-CM | POA: Diagnosis not present

## 2023-10-14 DIAGNOSIS — R918 Other nonspecific abnormal finding of lung field: Secondary | ICD-10-CM | POA: Diagnosis not present

## 2023-10-14 DIAGNOSIS — I10 Essential (primary) hypertension: Secondary | ICD-10-CM | POA: Diagnosis not present

## 2023-10-14 DIAGNOSIS — J189 Pneumonia, unspecified organism: Secondary | ICD-10-CM | POA: Diagnosis not present

## 2023-10-14 DIAGNOSIS — R0989 Other specified symptoms and signs involving the circulatory and respiratory systems: Secondary | ICD-10-CM | POA: Diagnosis not present

## 2023-10-14 LAB — BASIC METABOLIC PANEL
Anion gap: 8 (ref 5–15)
BUN: 18 mg/dL (ref 8–23)
CO2: 28 mmol/L (ref 22–32)
Calcium: 8.1 mg/dL — ABNORMAL LOW (ref 8.9–10.3)
Chloride: 99 mmol/L (ref 98–111)
Creatinine, Ser: 0.87 mg/dL (ref 0.44–1.00)
GFR, Estimated: >60 mL/min (ref >60)
Glucose, Bld: 136 mg/dL — ABNORMAL HIGH (ref 70–99)
Potassium: 3.4 mmol/L — ABNORMAL LOW (ref 3.5–5.1)
Sodium: 135 mmol/L (ref 135–145)

## 2023-10-14 LAB — CBC
HCT: 31.7 % — ABNORMAL LOW (ref 36.0–46.0)
Hemoglobin: 9.5 g/dL — ABNORMAL LOW (ref 12.0–15.0)
MCH: 26.8 pg (ref 26.0–34.0)
MCHC: 30 g/dL (ref 30.0–36.0)
MCV: 89.3 fL (ref 80.0–100.0)
Platelets: 235 10*3/uL (ref 150–400)
RBC: 3.55 MIL/uL — ABNORMAL LOW (ref 3.87–5.11)
RDW: 17.8 % — ABNORMAL HIGH (ref 11.5–15.5)
WBC: 11 10*3/uL — ABNORMAL HIGH (ref 4.0–10.5)
nRBC: 0 % (ref 0.0–0.2)

## 2023-10-14 LAB — T4, FREE: Free T4: 0.96 ng/dL (ref 0.61–1.12)

## 2023-10-14 LAB — TSH: TSH: 9.62 u[IU]/mL — ABNORMAL HIGH (ref 0.350–4.500)

## 2023-10-14 LAB — TROPONIN I (HIGH SENSITIVITY): Troponin I (High Sensitivity): 60 ng/L — ABNORMAL HIGH (ref <18)

## 2023-10-14 LAB — BRAIN NATRIURETIC PEPTIDE: B Natriuretic Peptide: 994.1 pg/mL — ABNORMAL HIGH (ref 0.0–100.0)

## 2023-10-14 MED ORDER — ONDANSETRON 4 MG PO TBDP: 4.0000 mg | ORAL_TABLET | Freq: Three times a day (TID) | ORAL | 0 refills | Status: DC | PRN

## 2023-10-14 MED ORDER — LACTATED RINGERS IV BOLUS: 500.0000 mL | Freq: Once | INTRAVENOUS | Status: DC

## 2023-10-14 MED ORDER — ACETAMINOPHEN 500 MG PO TABS
1000.0000 mg | ORAL_TABLET | Freq: Once | ORAL | Status: AC
  Administered 2023-10-14: 1000 mg via ORAL
  Filled 2023-10-14: qty 2

## 2023-10-14 MED ORDER — METOCLOPRAMIDE HCL 5 MG/ML IJ SOLN
10.0000 mg | Freq: Once | INTRAMUSCULAR | Status: DC
  Filled 2023-10-14: qty 2

## 2023-10-14 NOTE — ED Provider Notes (Signed)
 [facility]    Event Date/Time   First MD Initiated Contact with Patient 10/14/23 1812     (approximate)   History   Weakness   HPI  Kathryn Wise is a 89 y.o. female who presents to the ED for evaluation of Weakness   Review of medical DC summary from 2 days ago.  Admitted for influenza A, pneumonia and CHF exacerbation.  Otherwise history of HOCM, diastolic CHF, HTN, HLD, CAD, A-fib not on AC, renal artery stenosis with stent placement  Patient presents to the ED due to generalized weakness and nausea this morning.  She has been staying with her daughter, she and daughter provide cooperative history.  Thinks concerned that she was "just so nauseous" earlier this morning but no emesis, abdominal pain or stool changes.  She has been taking Augmentin since her recent hospitalization.  Reports that she was feeling very weak alongside this in a generalized fashion.   He has been ambulating around the house since being discharged without assistance device, no cane or walker needed.  No syncopal episodes or falls.   Physical Exam   Triage Vital Signs: ED Triage Vitals  Encounter Vitals Group     BP 10/14/23 1332 (!) 150/55     Systolic BP Percentile --      Diastolic BP Percentile --      Pulse Rate 10/14/23 1332 (!) 55     Resp 10/14/23 1332 15     Temp 10/14/23 1332 97.8 F (36.6 C)     Temp Source 10/14/23 1332 Oral     SpO2 10/14/23 1332 99 %     Weight 10/14/23 1333 142 lb (64.4 kg)     Height 10/14/23 1333 5\' 5"  (1.651 m)     Head Circumference --      Peak Flow --      Pain Score 10/14/23 1332 3     Pain Loc --      Pain Education --      Exclude from Growth Chart --     Most recent vital signs: Vitals:   10/14/23 1830 10/14/23 1830  BP: (!) 149/60   Pulse: (!) 55   Resp: 15   Temp:  97.8 F (36.6 C)  SpO2: 98%     General: Awake, no distress.  CV:  Good peripheral perfusion.  Resp:  Normal effort.  Abd:  No  distention.  Soft without tenderness MSK:  No deformity noted.  Neuro:  No focal deficits appreciated. Cranial nerves II through XII intact 5/5 strength and sensation in all 4 extremities Other:     ED Results / Procedures / Treatments   Labs (all labs ordered are listed, but only abnormal results are displayed) Labs Reviewed  BASIC METABOLIC PANEL - Abnormal; Notable for the following components:      Result Value   Potassium 3.4 (*)    Glucose, Bld 136 (*)    Calcium 8.1 (*)    All other components within normal limits  CBC - Abnormal; Notable for the following components:   WBC 11.0 (*)    RBC 3.55 (*)    Hemoglobin 9.5 (*)    HCT 31.7 (*)    RDW 17.8 (*)    All other components within normal limits  BRAIN NATRIURETIC PEPTIDE - Abnormal; Notable for the following components:   B Natriuretic Peptide 994.1 (*)    All other components within normal limits  TROPONIN I (HIGH SENSITIVITY) -  Abnormal; Notable for the following components:   Troponin I (High Sensitivity) 60 (*)    All other components within normal limits  URINALYSIS, ROUTINE W REFLEX MICROSCOPIC  T4, FREE  TSH    EKG Sinus rhythm with a rate of 55 bpm.  Normal axis.  Left bundle, no STEMI by Sgarbossa criteria  RADIOLOGY 1 view CXR interpreted by me with mild pulmonary vascular congestion  Official radiology report(s): No results found.  PROCEDURES and INTERVENTIONS:  .1-3 Lead EKG Interpretation  Performed by: Delton Prairie, MD Authorized by: Delton Prairie, MD     Interpretation: normal     ECG rate:  56   ECG rate assessment: normal     Rhythm: sinus rhythm     Ectopy: none     Conduction: normal     Medications  metoCLOPramide (REGLAN) injection 10 mg (10 mg Intravenous Not Given 10/14/23 1924)  acetaminophen (TYLENOL) tablet 1,000 mg (1,000 mg Oral Given 10/14/23 1916)     IMPRESSION / MDM / ASSESSMENT AND PLAN / ED COURSE  I reviewed the triage vital signs and the nursing  notes.  Differential diagnosis includes, but is not limited to, hypothyroidism, sepsis, UTI, CHF exacerbation, pleural effusion, medication side effect, AKI  {Patient presents with symptoms of an acute illness or injury that is potentially life-threatening.  Patient presents with resolving weakness and nausea without clear signs of acute pathology and suitable for trial of outpatient management with family.  No evidence of focal deficits, trauma.  Benign exam.  Mild elevated BNP lower than her recent admission.  Similar for her troponin.  Reassuring chemistry and hematology panel.  Pending thyroid studies at the time of discharge.  As below, patient acknowledging risks just wants to leave.  She has capacity and family is agreeable.  Clinical Course as of 10/14/23 2005  Mon Oct 14, 2023  2003 Reassessed, patient requesting discharge.  Reports she feels fine and does not need to be here and does not want to be here.  Daughter and granddaughter remain at the bedside and acknowledge that she seems okay now.  She is uncertain what happened this morning, she reports that she felt so nauseous this morning but she feels fine now.  We discussed possibly related to her Augmentin as a medication side effect.  Discussed the possibility of cystitis, but she reports her urine feels fine and she just wants to leave.  Acknowledges the risk of undiagnosed pathology.  Daughter is agreeable, they just want to take her home. [DS]    Clinical Course User Index [DS] Delton Prairie, MD     FINAL CLINICAL IMPRESSION(S) / ED DIAGNOSES   Final diagnoses:  Generalized weakness  Nausea     Rx / DC Orders   ED Discharge Orders          Ordered    ondansetron (ZOFRAN-ODT) 4 MG disintegrating tablet  Every 8 hours PRN        10/14/23 2004             Note:  This document was prepared using Dragon voice recognition software and may include unintentional dictation errors.   Delton Prairie, MD 10/14/23  2007

## 2023-10-14 NOTE — ED Triage Notes (Signed)
 Pt here via [facility] with weakness. Pt has abnormal EKGs. Pt was just released from this facility recently 2 days ago. Pt states she has been weak today, possible dehydration. Pt has a small hemorrhage in her abd from coughing hard recently.  156/78 50-60 HR 97% 2L 127-cbg

## 2023-10-14 NOTE — Discharge Instructions (Signed)
 I sent a prescription for Zofran to take as needed at home to help with any further nausea.  Return to the ED with any worsening symptoms

## 2023-10-14 NOTE — ED Triage Notes (Signed)
 Pt in via POV from home, patient was recently discharged from hospital due to Flu/PNA.  Daughter reports she was improving until today; presents today w/ extreme generalized weakness w/ difficulty ambulating, also endorses some nausea.  Denies any other complaints.  Patient arrives on 2L nasal cannula at baseline.    Patient A/Ox3, vitals unremarkable, NAD noted at this time.

## 2023-10-16 ENCOUNTER — Ambulatory Visit (INDEPENDENT_AMBULATORY_CARE_PROVIDER_SITE_OTHER): Admitting: Physician Assistant

## 2023-10-16 ENCOUNTER — Ambulatory Visit: Attending: Internal Medicine | Admitting: Internal Medicine

## 2023-10-16 ENCOUNTER — Other Ambulatory Visit: Payer: Self-pay

## 2023-10-16 ENCOUNTER — Ambulatory Visit
Admission: RE | Admit: 2023-10-16 | Discharge: 2023-10-16 | Disposition: A | Discharge status: Home / Self Care | Attending: Physician Assistant | Admitting: Physician Assistant

## 2023-10-16 ENCOUNTER — Encounter: Payer: Self-pay | Admitting: Internal Medicine

## 2023-10-16 ENCOUNTER — Ambulatory Visit
Admission: RE | Admit: 2023-10-16 | Discharge: 2023-10-16 | Disposition: A | Discharge status: Home / Self Care | Source: Ambulatory Visit | Attending: Physician Assistant | Admitting: *Deleted

## 2023-10-16 VITALS — BP 130/66 | HR 67 | Ht 65.0 in | Wt 144.0 lb

## 2023-10-16 VITALS — BP 124/74 | Ht 65.0 in | Wt 142.0 lb

## 2023-10-16 DIAGNOSIS — W19XXXD Unspecified fall, subsequent encounter: Secondary | ICD-10-CM

## 2023-10-16 DIAGNOSIS — I150 Renovascular hypertension: Secondary | ICD-10-CM | POA: Insufficient documentation

## 2023-10-16 DIAGNOSIS — S22081A Stable burst fracture of T11-T12 vertebra, initial encounter for closed fracture: Secondary | ICD-10-CM | POA: Diagnosis not present

## 2023-10-16 DIAGNOSIS — Z8781 Personal history of (healed) traumatic fracture: Secondary | ICD-10-CM

## 2023-10-16 DIAGNOSIS — S22089A Unspecified fracture of T11-T12 vertebra, initial encounter for closed fracture: Secondary | ICD-10-CM | POA: Diagnosis not present

## 2023-10-16 DIAGNOSIS — S301XXD Contusion of abdominal wall, subsequent encounter: Secondary | ICD-10-CM | POA: Diagnosis not present

## 2023-10-16 DIAGNOSIS — I5032 Chronic diastolic (congestive) heart failure: Secondary | ICD-10-CM | POA: Diagnosis not present

## 2023-10-16 DIAGNOSIS — S22080K Wedge compression fracture of T11-T12 vertebra, subsequent encounter for fracture with nonunion: Secondary | ICD-10-CM | POA: Diagnosis not present

## 2023-10-16 DIAGNOSIS — E785 Hyperlipidemia, unspecified: Secondary | ICD-10-CM | POA: Insufficient documentation

## 2023-10-16 DIAGNOSIS — I421 Obstructive hypertrophic cardiomyopathy: Secondary | ICD-10-CM | POA: Diagnosis not present

## 2023-10-16 DIAGNOSIS — J111 Influenza due to unidentified influenza virus with other respiratory manifestations: Secondary | ICD-10-CM | POA: Diagnosis not present

## 2023-10-16 DIAGNOSIS — M4185 Other forms of scoliosis, thoracolumbar region: Secondary | ICD-10-CM | POA: Diagnosis not present

## 2023-10-16 DIAGNOSIS — J9601 Acute respiratory failure with hypoxia: Secondary | ICD-10-CM | POA: Diagnosis not present

## 2023-10-16 NOTE — Patient Instructions (Addendum)
 Medication Instructions:  Your physician recommends that you continue on your current medications as directed. Please refer to the Current Medication list given to you today.   *If you need a refill on your cardiac medications before your next appointment, please call your pharmacy*   Lab Work: No test ordered today    Testing/Procedures: No test ordered today    Follow-Up: At [facility], you and your health needs are our priority.  As part of our continuing mission to provide you with exceptional heart care, we have created designated Provider Care Teams.  These Care Teams include your primary Cardiologist (physician) and Advanced Practice Providers (APPs -  Physician Assistants and Nurse Practitioners) who all work together to provide you with the care you need, when you need it.  We recommend signing up for the patient portal called "MyChart".  Sign up information is provided on this After Visit Summary.  MyChart is used to connect with patients for Virtual Visits (Telemedicine).  Patients are able to view lab/test results, encounter notes, upcoming appointments, etc.  Non-urgent messages can be sent to your provider as well.   To learn more about what you can do with MyChart, go to [email or web address].    Your next appointment:   3 month(s)  Provider:   Eula Listen, PA-C

## 2023-10-16 NOTE — Progress Notes (Unsigned)
 Cardiology Office Note:  .   Date:  10/17/2023  ID:  NIDYA BOUYER, DOB 02/17/1930, MRN [id number] PCP: Enid Baas, MD  [facility] Providers Cardiologist:  Yvonne Kendall, MD     History of Present Illness: Kathryn Wise   Kathryn Wise is a 89 y.o. female with history of nonobstructive coronary artery disease, hypertrophic obstructive cardiomyopathy, heart failure with preserved ejection fraction, labile hypertension with left renal artery stenosis status post stenting, carotid artery disease status post left carotid stenting, possible small subdural hematoma, and hyperlipidemia, who presents for hospital follow-up.  She was last seen in our office about a week ago for hospital follow-up of hospitalization in February with acute left rectus sheath hematoma.  She was readmitted a few days later due to weakness and back pain and was found to have a T12 burst fracture.  Her hospitalization was complicated by hyponatremia and acute blood loss anemia.  When she was seen in the office last week, she was not doing well with increased oxygen requirement and significant shortness of breath.  She also had poor oral intake.  She was referred to the ED and was ultimately admitted with a diagnosis of influenza infection.  She was discharged on 10/12/2023 but returned to the ED two days later with generalized weakness and nausea.  It was thought that her nausea could have been due to Augmentin that she was prescribed during her previous hospitalization.  She was discharged home and happily reports that she is feeling better.  [name] notes that her breathing is back to her baseline and that she would like to stop using supplement oxygen that she was sent home on.  She denies chest pain and palpitations.  She has not had any further lightheadedness/dizziness or nausea since her ED visit 2 days ago.  Her only complaint is of persistent back pain related to her T12 burst fracture, though this seems to be  gradually improving.  She saw neurosurgery earlier today and has opted to continue with conservative management.  She is not on any anticoagulation/antiplatelet therapy following development of rectus sheath hematoma from forceful coughing (she believes that she had even stopped aspirin before this, though it was still on her medication list at her visit with Eula Listen, PA, in 06/2023).  ROS: See HPI  Studies Reviewed: Kathryn Wise        TTE (08/05/2023): Normal LV size with moderate concentric hypertrophy and severe basal septal LVH.  LVEF 60-65% with normal wall motion and grade 1 diastolic dysfunction.  LVOT gradient of 99 mmHg noted with Valsalva.  Normal RV size and function.  Normal PA pressure.  Severe left atrial enlargement.  Mild to moderate mitral regurgitation with severe mitral annular calcification.  Aortic sclerosis without stenosis.  Normal CVP.  Risk Assessment/Calculations:             Physical Exam:   VS:  BP 130/66 (BP Location: Left Arm, Patient Position: Sitting, Cuff Size: Normal)   Pulse 67   Ht 5\' 5"  (1.651 m)   Wt 144 lb (65.3 kg)   SpO2 96%   BMI 23.96 kg/m    Wt Readings from Last 3 Encounters:  10/16/23 144 lb (65.3 kg)  10/16/23 142 lb (64.4 kg)  10/14/23 142 lb (64.4 kg)    General:  NAD. Neck: No JVD or HJR. Lungs: Clear to auscultation bilaterally without wheezes or crackles. Heart: Regular rate and rhythm with 3/6 systolic murmur.  No rubs or gallops. Abdomen: Soft, nontender,  nondistended. Extremities: No lower extremity edema.  ASSESSMENT AND PLAN: .    HOCM and chronic HFpEF: Dyspnea improved and back to baseline.  Lightheadedness following rectus sheath hematoma and subsequent infections has also resolved.  Recommend continuation of bisoprolol for target HR in the 50's-60's.  Encouraged Ms. Mahaffy to stay adequately hydrated.  Acute respiratory failure with hypoxia due to influenza infection: Breathing and oxygen saturation improved.  Recommend follow-up  with PCP to determine if supplemental oxygen can be discontinued altogether.  Rectus sheath hematoma: Suspected to have been caused by vigorous coughing.  It is unclear if she was still on aspirin at that time.  Hemoglobin still low but gradually improving on last check 2 days ago.  Defer resuming aspirin at this time, though we will need to readdress that at follow-up in the setting of renal artery and carotid artery stents.  Hypertension: BP upper normal today.  With recent illnesses and lightheadedness leading to fall and T12 burst fracture, we will tolerate a degree of permissive hypertension.  Continue current doses of amlodipine and bisoprolol.  Consider resumption of aspirin at next visit if no further bleeding in the setting of recent rectus sheath hematoma.  Hyperlipidemia: Continue rosuvastatin 10 mg daily for secondary prevention of ASCVD.  LDL reasonable on last check in 11/2022 at 64.  T12 burst fracture: This is being managed conservatively.  Continue follow-up with neurosurgery.    Dispo: Return to clinic in 3 months.  Signed, Yvonne Kendall, MD

## 2023-10-16 NOTE — Progress Notes (Signed)
 Follow-up note: Referring Physician:  No referring provider defined for this encounter.  Primary Physician:  Enid Baas, MD  Chief Complaint:  T12 compression fracture  History of Present Illness: Kathryn Wise is a 89 y.o. female who presents with the chief complaint of T12 compression fracture found 3 weeks ago after coming to the ED for intractable back pain following recent admission for an abdominal wall tear after a severe coughing bout/bronchitis.  She continues to have left-sided low back pain.  The original brace that she was given while in inpatient was making her uncomfortable and she is currently wearing 1 that she obtained over-the-counter.  She feels as though her pain decreases when she wears it.  She denies any numbness or tingling.  She denies any weakness.  She feels that her pain is well-controlled with Tylenol.  No changes to her bowel or bladder function.  Review of Systems:  A 10 point review of systems is negative, except for the pertinent positives and negatives detailed in the HPI.  Past Medical History: Past Medical History:  Diagnosis Date   Arthritis    Collagen vascular disease (HCC)    Dysphagia, pharyngoesophageal phase 09/10/2013   Upper GI study with barium swallow was done at  [facility] Mar 2015   No reflux seen  Small irreducible hiatal hernia  Mild changes of presbyeophagus (abnormal contractions of the esophagus that occur with aging) No strictures Normal gastric emptying  Incomplete visualization of stomach fold due to patient's inability turn     GERD (gastroesophageal reflux disease)    Heart murmur    has had years and years   History of shingles Dec 2013   treated with steroids , post op from shoulder surgery   History of squamous cell carcinoma 05/02/2016   right mid lateral pretibial   Hypertension    Hypothyroidism    Major depressive disorder, single episode 11/05/2015   Osteoarthritis of left shoulder 07/14/2012   Pneumonia  10/21/2020   Squamous cell carcinoma of skin 05/12/2016   R mid lat pretibial - other skin cancers treated by Dr. Jarold Motto   Squamous cell carcinoma of skin 08/22/2020   left distal medial popliteal - [facility]    Past Surgical History: Past Surgical History:  Procedure Laterality Date   ARTHOSCOPIC ROTAOR CUFF REPAIR     LEFT  10+  YEARS     CAROTID PTA/STENT INTERVENTION Left 10/19/2020   Procedure: CAROTID PTA/STENT INTERVENTION;  Surgeon: Renford Dills, MD;  Location: [facility] INVASIVE CV LAB;  Service: Cardiovascular;  Laterality: Left;   CATARACT EXTRACTION W/ INTRAOCULAR LENS IMPLANT     RIGHT EYE   CATARACT EXTRACTION W/PHACO Left 03/28/2016   Procedure: CATARACT EXTRACTION PHACO AND INTRAOCULAR LENS PLACEMENT (IOC);  Surgeon: Lockie Mola, MD;  Location: [facility];  Service: Ophthalmology;  Laterality: Left;  TORIC   FACELIFT     FOOT SURGERY     rt foot   TUMOR REMOVED   JOINT REPLACEMENT Left 06/2012   shoulder   LEFT HEART CATH AND CORONARY ANGIOGRAPHY N/A 06/12/2017   Procedure: LEFT HEART CATH AND CORONARY ANGIOGRAPHY;  Surgeon: Antonieta Iba, MD;  Location: [facility] INVASIVE CV LAB;  Service: Cardiovascular;  Laterality: N/A;   RENAL ANGIOGRAPHY Left 01/07/2019   Procedure: RENAL ANGIOGRAPHY;  Surgeon: Renford Dills, MD;  Location: [facility] INVASIVE CV LAB;  Service: Cardiovascular;  Laterality: Left;   RENAL ARTERY STENT     SKIN CANCER EXCISION     TOTAL  SHOULDER ARTHROPLASTY  07/14/2012   Procedure: TOTAL SHOULDER ARTHROPLASTY;  Surgeon: Eulas Post, MD;  Location: [facility] OR;  Service: Orthopedics;  Laterality: Left;    Allergies: Allergies as of 10/16/2023 - Review Complete 10/16/2023  Allergen Reaction Noted   Alendronate Swelling 10/08/2014   Lisinopril Other (See Comments) 11/03/2019   Codeine Other (See Comments) 12/07/2021   Oxycodone  07/09/2012   Oxycontin [oxycodone hcl]  07/09/2012    Medications: Outpatient Encounter Medications as  of 10/16/2023  Medication Sig   amLODipine (NORVASC) 5 MG tablet Take 1 tablet (5 mg total) by mouth as needed (Once daily AS NEEDED for SBP (top blood pressure number) is greater than 160.).   bisacodyl (DULCOLAX) 5 MG EC tablet Take 1 tablet (5 mg total) by mouth daily as needed for moderate constipation.   bisoprolol (ZEBETA) 5 MG tablet Take 1 tablet (5 mg total) by mouth every other day.   Cholecalciferol (VITAMIN D-3) 1000 units CAPS Take 1,000 Units by mouth daily.   cyanocobalamin (VITAMIN B12) 1000 MCG tablet Take 1,000 mcg by mouth daily.   lactulose (CHRONULAC) 10 GM/15ML solution Take 45 mLs (30 g total) by mouth 2 (two) times daily as needed for mild constipation.   levothyroxine (SYNTHROID) 75 MCG tablet Take 75 mcg by mouth daily.   lidocaine (LIDODERM) 5 % Place 1-3 patches onto the skin daily. Remove & Discard patch within 12 hours or as directed by MD   nitroGLYCERIN (NITROSTAT) 0.4 MG SL tablet Place 1 tablet (0.4 mg total) under the tongue every 5 (five) minutes as needed for chest pain.   omeprazole (PRILOSEC) 20 MG capsule TAKE 1 CAPSULE(20 MG) BY MOUTH EVERY MORNING AS NEEDED FOR HEARTBURN   rosuvastatin (CRESTOR) 10 MG tablet Take 1 tablet (10 mg total) by mouth at bedtime.   temazepam (RESTORIL) 15 MG capsule Take 1 capsule (15 mg total) by mouth at bedtime as needed for sleep.   Turmeric 500 MG CAPS Take 500 mg by mouth daily.   XIIDRA 5 % SOLN Apply 1 drop to eye 2 (two) times daily.   [EXPIRED] amoxicillin-clavulanate (AUGMENTIN) 875-125 MG tablet Take 1 tablet by mouth 2 (two) times daily for 3 days.   [DISCONTINUED] albuterol (VENTOLIN HFA) 108 (90 Base) MCG/ACT inhaler Inhale into the lungs.   [DISCONTINUED] benzonatate (TESSALON) 200 MG capsule Take 200 mg by mouth 3 (three) times daily as needed for cough.   [DISCONTINUED] docusate sodium (COLACE) 100 MG capsule Take 1 capsule (100 mg total) by mouth 2 (two) times daily. (Patient not taking: Reported on 10/16/2023)    [DISCONTINUED] HYDROcodone-acetaminophen (NORCO/VICODIN) 5-325 MG tablet Take 1-2 tablets by mouth every 4 (four) hours as needed for moderate pain (pain score 4-6).   [DISCONTINUED] ipratropium-albuterol (DUONEB) 0.5-2.5 (3) MG/3ML SOLN Inhale into the lungs.   [DISCONTINUED] ondansetron (ZOFRAN-ODT) 4 MG disintegrating tablet Take 4 mg by mouth every 8 (eight) hours as needed.   [DISCONTINUED] ondansetron (ZOFRAN-ODT) 4 MG disintegrating tablet Take 1 tablet (4 mg total) by mouth every 8 (eight) hours as needed.   [DISCONTINUED] polyethylene glycol (MIRALAX / GLYCOLAX) 17 g packet Take 17 g by mouth daily.   [DISCONTINUED] predniSONE (DELTASONE) 20 MG tablet Take 20 mg by mouth 2 (two) times daily.   [DISCONTINUED] sodium chloride 1 g tablet Take 1 tablet (1 g total) by mouth 2 (two) times daily with a meal for 10 days.   No facility-administered encounter medications on file as of 10/16/2023.    Social History: Social  History   Tobacco Use   Smoking status: Former    Current packs/day: 0.00    Types: Cigarettes    Quit date: 07/30/1972    Years since quitting: 51.2   Smokeless tobacco: Never   Tobacco comments:    smoked for only a few months  Vaping Use   Vaping status: Never Used  Substance Use Topics   Alcohol use: Yes    Comment: OCCAS WINE    Drug use: No    Family Medical History: Family History  Problem Relation Age of Onset   Cancer Daughter        Breast   Breast cancer Daughter 48   Cancer Son     Exam: @VITALWITHPAIN @  Alert and oriented x 3  Full strength bilateral lower extremities with the exception of left lower extremity dorsiflexion which is her baseline.  Full strength to bilateral upper extremities with the exception of her grip and intrinsics.  No tenderness to palpation of her thoracic or lumbar spine. Gait was not visualized as patient was in a wheelchair for examination.  Imaging: X-rays of her thoracic spine completed today does show  worsening of her compression fracture.  No formal radiology report is available at this time.  I have personally reviewed the images and agree with the above interpretation.  Assessment and Plan: Ms. Vinsant is a pleasant 89 y.o. female  who presents with the chief complaint of T12 compression fracture found 3 weeks ago after coming to the ED for intractable back pain following recent admission for an abdominal wall tear after a severe coughing bout/bronchitis.  She continues to have left-sided low back pain.  The original brace that she was given while in inpatient was making her uncomfortable and she is currently wearing 1 that she obtained over-the-counter.  She feels as though her pain decreases when she wears it.  She denies any numbness or tingling.  She denies any weakness.  She feels that her pain is well-controlled with Tylenol.  No changes to her bowel or bladder function.  Her examination is to baseline.  My review of x-rays does show worsening of her T12 compression fractures.  Will await formal radiology read.  Pleasure to see patient in clinic today.  She is aware of her T12 compression fracture worsening however she stands by that her pain is under control and she would not want any intervention of her spine.  She would like to continue wearing her brace as needed and contact us if there is any changes.  And contact us if there is any changes.  Red flag symptoms were reviewed at length and her and her daughter expressed understanding.  She will continue therapy at home and is eager to ambulate and do household chores.  I spent a total of 30 minutes in both face-to-face and non-face-to-face activities for this visit on the date of this encounter including review of recent x-rays, continued care therapy, and counseling on progression of thoracic compression fracture and care options going forward.  Joan Flores PA-C Neurosurgery

## 2023-10-17 ENCOUNTER — Encounter: Payer: Self-pay | Admitting: Internal Medicine

## 2023-10-17 DIAGNOSIS — M51369 Other intervertebral disc degeneration, lumbar region without mention of lumbar back pain or lower extremity pain: Secondary | ICD-10-CM | POA: Diagnosis not present

## 2023-10-17 DIAGNOSIS — I110 Hypertensive heart disease with heart failure: Secondary | ICD-10-CM | POA: Diagnosis not present

## 2023-10-17 DIAGNOSIS — J1000 Influenza due to other identified influenza virus with unspecified type of pneumonia: Secondary | ICD-10-CM | POA: Diagnosis not present

## 2023-10-17 DIAGNOSIS — J9611 Chronic respiratory failure with hypoxia: Secondary | ICD-10-CM | POA: Diagnosis not present

## 2023-10-17 DIAGNOSIS — D649 Anemia, unspecified: Secondary | ICD-10-CM | POA: Diagnosis not present

## 2023-10-17 DIAGNOSIS — J208 Acute bronchitis due to other specified organisms: Secondary | ICD-10-CM | POA: Diagnosis not present

## 2023-10-17 DIAGNOSIS — M47816 Spondylosis without myelopathy or radiculopathy, lumbar region: Secondary | ICD-10-CM | POA: Diagnosis not present

## 2023-10-17 DIAGNOSIS — I252 Old myocardial infarction: Secondary | ICD-10-CM | POA: Diagnosis not present

## 2023-10-17 DIAGNOSIS — I2510 Atherosclerotic heart disease of native coronary artery without angina pectoris: Secondary | ICD-10-CM | POA: Diagnosis not present

## 2023-10-17 DIAGNOSIS — S22081D Stable burst fracture of T11-T12 vertebra, subsequent encounter for fracture with routine healing: Secondary | ICD-10-CM | POA: Diagnosis not present

## 2023-10-17 DIAGNOSIS — R1310 Dysphagia, unspecified: Secondary | ICD-10-CM | POA: Diagnosis not present

## 2023-10-17 DIAGNOSIS — I700 Atherosclerosis of aorta: Secondary | ICD-10-CM | POA: Diagnosis not present

## 2023-10-17 DIAGNOSIS — E785 Hyperlipidemia, unspecified: Secondary | ICD-10-CM | POA: Diagnosis not present

## 2023-10-17 DIAGNOSIS — K449 Diaphragmatic hernia without obstruction or gangrene: Secondary | ICD-10-CM | POA: Diagnosis not present

## 2023-10-17 DIAGNOSIS — I5032 Chronic diastolic (congestive) heart failure: Secondary | ICD-10-CM | POA: Diagnosis not present

## 2023-10-17 DIAGNOSIS — Z85828 Personal history of other malignant neoplasm of skin: Secondary | ICD-10-CM | POA: Diagnosis not present

## 2023-10-17 DIAGNOSIS — J101 Influenza due to other identified influenza virus with other respiratory manifestations: Secondary | ICD-10-CM | POA: Diagnosis not present

## 2023-10-17 DIAGNOSIS — I4891 Unspecified atrial fibrillation: Secondary | ICD-10-CM | POA: Diagnosis not present

## 2023-10-17 DIAGNOSIS — E871 Hypo-osmolality and hyponatremia: Secondary | ICD-10-CM | POA: Diagnosis not present

## 2023-10-17 DIAGNOSIS — E039 Hypothyroidism, unspecified: Secondary | ICD-10-CM | POA: Diagnosis not present

## 2023-10-17 DIAGNOSIS — M48061 Spinal stenosis, lumbar region without neurogenic claudication: Secondary | ICD-10-CM | POA: Diagnosis not present

## 2023-10-17 DIAGNOSIS — F32A Depression, unspecified: Secondary | ICD-10-CM | POA: Diagnosis not present

## 2023-10-17 DIAGNOSIS — I447 Left bundle-branch block, unspecified: Secondary | ICD-10-CM | POA: Diagnosis not present

## 2023-10-17 DIAGNOSIS — M47817 Spondylosis without myelopathy or radiculopathy, lumbosacral region: Secondary | ICD-10-CM | POA: Diagnosis not present

## 2023-10-17 DIAGNOSIS — I421 Obstructive hypertrophic cardiomyopathy: Secondary | ICD-10-CM | POA: Diagnosis not present

## 2023-10-21 DIAGNOSIS — J101 Influenza due to other identified influenza virus with other respiratory manifestations: Secondary | ICD-10-CM | POA: Diagnosis not present

## 2023-10-21 DIAGNOSIS — E871 Hypo-osmolality and hyponatremia: Secondary | ICD-10-CM | POA: Diagnosis not present

## 2023-10-21 DIAGNOSIS — Z09 Encounter for follow-up examination after completed treatment for conditions other than malignant neoplasm: Secondary | ICD-10-CM | POA: Diagnosis not present

## 2023-10-21 DIAGNOSIS — J208 Acute bronchitis due to other specified organisms: Secondary | ICD-10-CM | POA: Diagnosis not present

## 2023-10-21 DIAGNOSIS — J1000 Influenza due to other identified influenza virus with unspecified type of pneumonia: Secondary | ICD-10-CM | POA: Diagnosis not present

## 2023-10-21 DIAGNOSIS — I110 Hypertensive heart disease with heart failure: Secondary | ICD-10-CM | POA: Diagnosis not present

## 2023-10-21 DIAGNOSIS — I10 Essential (primary) hypertension: Secondary | ICD-10-CM | POA: Diagnosis not present

## 2023-10-21 DIAGNOSIS — J9611 Chronic respiratory failure with hypoxia: Secondary | ICD-10-CM | POA: Diagnosis not present

## 2023-10-22 DIAGNOSIS — J101 Influenza due to other identified influenza virus with other respiratory manifestations: Secondary | ICD-10-CM | POA: Diagnosis not present

## 2023-10-22 DIAGNOSIS — E871 Hypo-osmolality and hyponatremia: Secondary | ICD-10-CM | POA: Diagnosis not present

## 2023-10-22 DIAGNOSIS — J1000 Influenza due to other identified influenza virus with unspecified type of pneumonia: Secondary | ICD-10-CM | POA: Diagnosis not present

## 2023-10-22 DIAGNOSIS — J208 Acute bronchitis due to other specified organisms: Secondary | ICD-10-CM | POA: Diagnosis not present

## 2023-10-22 DIAGNOSIS — I110 Hypertensive heart disease with heart failure: Secondary | ICD-10-CM | POA: Diagnosis not present

## 2023-10-22 DIAGNOSIS — J9611 Chronic respiratory failure with hypoxia: Secondary | ICD-10-CM | POA: Diagnosis not present

## 2023-10-22 NOTE — Telephone Encounter (Signed)
 I left another voicemail for Kathryn Wise to call back our office to discuss her symptoms.

## 2023-10-22 NOTE — Telephone Encounter (Signed)
 Spoke to the daughter of Kathryn Wise. She is unable to provide more details about her symptoms as she lives 4 hours away. I attempted to call Mrs. Rolland and get more details regarding her symptoms.

## 2023-10-22 NOTE — Telephone Encounter (Signed)
 Attempted to call Kathryn Wise to get more information regarding her mother's reported symptoms

## 2023-10-23 ENCOUNTER — Ambulatory Visit: Payer: Medicare Other | Admitting: Dermatology

## 2023-10-23 ENCOUNTER — Telehealth: Payer: Self-pay | Admitting: Physician Assistant

## 2023-10-23 NOTE — Telephone Encounter (Signed)
 Attempted call to home and cell with no answer. Left voicemail on home phone.

## 2023-10-23 NOTE — Telephone Encounter (Signed)
 I talked to Kathryn Wise this morning. She states that she has had left foot numbness intermittently since being in the hospital.   She states that yesterday morning was the first time she had tried standing in a few days due to her pain. She states that the numbness has disapated since yesterday AM.   Her numbess worsens while sitting but goes away when she is up and walking.

## 2023-10-23 NOTE — Telephone Encounter (Signed)
 Spoke to [employer], she said that she would call the patient today to discuss her symptoms.

## 2023-10-24 ENCOUNTER — Encounter: Payer: Self-pay | Admitting: Emergency Medicine

## 2023-10-24 ENCOUNTER — Emergency Department

## 2023-10-24 ENCOUNTER — Emergency Department
Admission: EM | Admit: 2023-10-24 | Discharge: 2023-10-24 | Disposition: A | Discharge status: Home / Self Care | Attending: Emergency Medicine | Admitting: Emergency Medicine

## 2023-10-24 ENCOUNTER — Telehealth: Payer: Self-pay | Admitting: Physician Assistant

## 2023-10-24 ENCOUNTER — Other Ambulatory Visit: Payer: Self-pay | Admitting: Physician Assistant

## 2023-10-24 ENCOUNTER — Other Ambulatory Visit: Payer: Self-pay

## 2023-10-24 DIAGNOSIS — R29818 Other symptoms and signs involving the nervous system: Secondary | ICD-10-CM | POA: Diagnosis not present

## 2023-10-24 DIAGNOSIS — M546 Pain in thoracic spine: Secondary | ICD-10-CM | POA: Diagnosis not present

## 2023-10-24 DIAGNOSIS — I5030 Unspecified diastolic (congestive) heart failure: Secondary | ICD-10-CM | POA: Diagnosis not present

## 2023-10-24 DIAGNOSIS — K449 Diaphragmatic hernia without obstruction or gangrene: Secondary | ICD-10-CM | POA: Diagnosis not present

## 2023-10-24 DIAGNOSIS — M4804 Spinal stenosis, thoracic region: Secondary | ICD-10-CM | POA: Diagnosis not present

## 2023-10-24 DIAGNOSIS — I110 Hypertensive heart disease with heart failure: Secondary | ICD-10-CM | POA: Diagnosis not present

## 2023-10-24 DIAGNOSIS — M5105 Intervertebral disc disorders with myelopathy, thoracolumbar region: Secondary | ICD-10-CM | POA: Diagnosis not present

## 2023-10-24 DIAGNOSIS — R609 Edema, unspecified: Secondary | ICD-10-CM | POA: Diagnosis not present

## 2023-10-24 DIAGNOSIS — M48061 Spinal stenosis, lumbar region without neurogenic claudication: Secondary | ICD-10-CM | POA: Diagnosis not present

## 2023-10-24 DIAGNOSIS — I10 Essential (primary) hypertension: Secondary | ICD-10-CM | POA: Diagnosis not present

## 2023-10-24 DIAGNOSIS — R200 Anesthesia of skin: Secondary | ICD-10-CM | POA: Diagnosis not present

## 2023-10-24 DIAGNOSIS — R29810 Facial weakness: Secondary | ICD-10-CM | POA: Insufficient documentation

## 2023-10-24 DIAGNOSIS — M419 Scoliosis, unspecified: Secondary | ICD-10-CM | POA: Diagnosis not present

## 2023-10-24 DIAGNOSIS — I6782 Cerebral ischemia: Secondary | ICD-10-CM | POA: Diagnosis not present

## 2023-10-24 DIAGNOSIS — M4316 Spondylolisthesis, lumbar region: Secondary | ICD-10-CM | POA: Diagnosis not present

## 2023-10-24 DIAGNOSIS — S22081A Stable burst fracture of T11-T12 vertebra, initial encounter for closed fracture: Secondary | ICD-10-CM | POA: Diagnosis not present

## 2023-10-24 DIAGNOSIS — M21372 Foot drop, left foot: Secondary | ICD-10-CM

## 2023-10-24 LAB — CBC WITH DIFFERENTIAL/PLATELET
Abs Immature Granulocytes: 0.01 10*3/uL (ref 0.00–0.07)
Basophils Absolute: 0 10*3/uL (ref 0.0–0.1)
Basophils Relative: 0 %
Eosinophils Absolute: 0 10*3/uL (ref 0.0–0.5)
Eosinophils Relative: 0 %
HCT: 31.3 % — ABNORMAL LOW (ref 36.0–46.0)
Hemoglobin: 9.4 g/dL — ABNORMAL LOW (ref 12.0–15.0)
Immature Granulocytes: 0 %
Lymphocytes Relative: 40 %
Lymphs Abs: 2.9 10*3/uL (ref 0.7–4.0)
MCH: 27.2 pg (ref 26.0–34.0)
MCHC: 30 g/dL (ref 30.0–36.0)
MCV: 90.7 fL (ref 80.0–100.0)
Monocytes Absolute: 0.7 10*3/uL (ref 0.1–1.0)
Monocytes Relative: 10 %
Neutro Abs: 3.6 10*3/uL (ref 1.7–7.7)
Neutrophils Relative %: 50 %
Platelets: 202 10*3/uL (ref 150–400)
RBC: 3.45 MIL/uL — ABNORMAL LOW (ref 3.87–5.11)
RDW: 19.8 % — ABNORMAL HIGH (ref 11.5–15.5)
WBC: 7.1 10*3/uL (ref 4.0–10.5)
nRBC: 0 % (ref 0.0–0.2)

## 2023-10-24 LAB — COMPREHENSIVE METABOLIC PANEL WITH GFR
ALT: 13 U/L (ref 0–44)
AST: 21 U/L (ref 15–41)
Albumin: 3.4 g/dL — ABNORMAL LOW (ref 3.5–5.0)
Alkaline Phosphatase: 62 U/L (ref 38–126)
Anion gap: 6 (ref 5–15)
BUN: 11 mg/dL (ref 8–23)
CO2: 24 mmol/L (ref 22–32)
Calcium: 8.7 mg/dL — ABNORMAL LOW (ref 8.9–10.3)
Chloride: 106 mmol/L (ref 98–111)
Creatinine, Ser: 0.95 mg/dL (ref 0.44–1.00)
GFR, Estimated: 57 mL/min — ABNORMAL LOW (ref >60)
Glucose, Bld: 86 mg/dL (ref 70–99)
Potassium: 3.6 mmol/L (ref 3.5–5.1)
Sodium: 136 mmol/L (ref 135–145)
Total Bilirubin: 0.9 mg/dL (ref 0.0–1.2)
Total Protein: 6.4 g/dL — ABNORMAL LOW (ref 6.5–8.1)

## 2023-10-24 NOTE — Progress Notes (Signed)
 Spoke with Ms. Morocco again on the phone.  She is unsure of how she would like to proceed.  Due to the new foot drop she would like to go to the emergency department to get a MRI as soon as possible.  I expressed understanding.  Reiterated to her that we are happy to see her and answer any questions.  We will review the MRI as soon as it is complete.  Again she states that she has no saddle anesthesia or new incontinence of bowel or bladder.

## 2023-10-24 NOTE — Telephone Encounter (Signed)
 Patient's daughter, Eunice Blase called and left a message during lunch and asked for our office to call her back after 3pm. I spoke with Eunice Blase and she states that her mother did go to the ER and they are doing an MRI from the head down so that they can also rule out a stroke. Patient's daughter scheduled the patient an appointment for the patient on 10/28/2023 and the caregiver will bring her. She states that she lives four hours away but that you can always call her or the patient's granddaughter, Hannah Beat who lives here in [place].

## 2023-10-24 NOTE — Telephone Encounter (Signed)
 Talked with this patient regarding new left foot numbness and what sounds to be a left foot drop.  She states that she was at the beauty salon and she noticed increased numbness in her left leg.  She then later noticed that she was not able to lift her ankle on her left side the same as the right.  She feels as though it is dragging and the strength is not there.  She denies any falls.  She denies any saddle anesthesia or new incontinence of bowel or bladder.  She denies any severe back pain.  I instructed her that we would like to see her in clinic as soon as possible where she may go to the emergency department.  The patient states that she would like to be seen in clinic but would like to wait until perhaps next week because she does not have a ride and would like a family member to come with her.  I notified her of red flag symptoms and encouraged her to come in to clinic whenever she can and I am happy to see her.  She stated that she is 52 and does not want to do any major interventions, but would still like her evaluated as soon as we can.

## 2023-10-24 NOTE — ED Provider Notes (Signed)
 [facility] Provider Note    Event Date/Time   First MD Initiated Contact with Patient 10/24/23 1350     (approximate)   History   Chief Complaint Numbness   HPI  Kathryn Wise is a 89 y.o. female with past medical history of hypertension, hyperlipidemia, atrial fibrillation, diastolic CHF, HOCM, and anemia who presents to the ED complaining of numbness.  Patient reports that she had a fall a week or 2 ago while in the hospital, was found to have thoracic compression fracture at that time.  She has been following with neurosurgery for this issue and wearing a brace, but yesterday began to noticed some numbness and weakness in her left foot.  She states she feels like she cannot lift the foot up, but denies any weakness lifting her leg at the hip, flexing or extending at the knee.  She denies any symptoms in her right lower extremity or her upper extremities, has not had any vision changes, speech changes, or facial droop.  She spoke with a provider in the neurosurgery office earlier today, who recommended she come to the ED for an MRI.  She does report the pain in the middle of her back was worse last night, but denies any new falls.     Physical Exam   Triage Vital Signs: ED Triage Vitals  Encounter Vitals Group     BP 10/24/23 1119 (!) 150/72     Systolic BP Percentile --      Diastolic BP Percentile --      Pulse Rate 10/24/23 1119 (!) 58     Resp 10/24/23 1119 18     Temp 10/24/23 1119 98.1 F (36.7 C)     Temp Source 10/24/23 1119 Oral     SpO2 10/24/23 1119 99 %     Weight 10/24/23 1122 140 lb (63.5 kg)     Height 10/24/23 1122 5\' 5"  (1.651 m)     Head Circumference --      Peak Flow --      Pain Score 10/24/23 1125 7     Pain Loc --      Pain Education --      Exclude from Growth Chart --     Most recent vital signs: Vitals:   10/24/23 1119 10/24/23 1427  BP: (!) 150/72 (!) 172/79  Pulse: (!) 58 69  Resp: 18 18  Temp: 98.1 F (36.7  C) 97.9 F (36.6 C)  SpO2: 99% 100%    Constitutional: Alert and oriented. Eyes: Conjunctivae are normal. Head: Atraumatic. Nose: No congestion/rhinnorhea. Mouth/Throat: Mucous membranes are moist.  Cardiovascular: Normal rate, regular rhythm. Grossly normal heart sounds.  2+ radial and DP pulses bilaterally. Respiratory: Normal respiratory effort.  No retractions. Lungs CTAB. Gastrointestinal: Soft and nontender. No distention. Musculoskeletal: No lower extremity tenderness nor edema.  Midline thoracic and lumbar spinal tenderness to palpation noted. Neurologic:  Normal speech and language.  3 out of 5 strength with dorsiflexion of the left foot, otherwise 5 out of 5 strength in the bilateral lower extremities as well as bilateral upper extremities.    ED Results / Procedures / Treatments   Labs (all labs ordered are listed, but only abnormal results are displayed) Labs Reviewed  CBC WITH DIFFERENTIAL/PLATELET - Abnormal; Notable for the following components:      Result Value   RBC 3.45 (*)    Hemoglobin 9.4 (*)    HCT 31.3 (*)    RDW 19.8 (*)  All other components within normal limits  COMPREHENSIVE METABOLIC PANEL WITH GFR - Abnormal; Notable for the following components:   Calcium 8.7 (*)    Total Protein 6.4 (*)    Albumin 3.4 (*)    GFR, Estimated 57 (*)    All other components within normal limits     EKG  ED ECG REPORT I, Chesley Noon, the attending physician, personally viewed and interpreted this ECG.   Date: 10/24/2023  EKG Time: 14:26  Rate: 61  Rhythm: normal sinus rhythm  Axis: Normal  Intervals:left bundle branch block  ST&T Change: None  PROCEDURES:  Critical Care performed: No  Procedures   MEDICATIONS ORDERED IN ED: Medications - No data to display   IMPRESSION / MDM / ASSESSMENT AND PLAN / ED COURSE  I reviewed the triage vital signs and the nursing notes.                              89 y.o. female with past medical history  of hypertension, hyperlipidemia, atrial fibrillation, diastolic CHF, and anemia who presents to the ED with numbness and weakness in her left foot following recent thoracic compression fracture.  Patient's presentation is most consistent with acute presentation with potential threat to life or bodily function.  Differential diagnosis includes, but is not limited to, thoracic myelopathy, lumbar myelopathy, stroke, peripheral neuropathy, anemia, electrolyte abnormality, AKI.  Patient nontoxic-appearing and in no acute distress, vital signs are unremarkable.  She has strong DP pulses bilaterally but does seem to have some weakness with dorsiflexion of her left foot, no other weakness noted in her lower extremities or upper extremities.  This seems less likely to correlate to injury to her thoracic spine, will check MRI of the thoracic spine as well as lumbar spine and brain.  Labs without significant anemia, leukocytosis, electrolyte abnormality, or AKI.  Patient turned over to oncoming provider pending her MRI results.      FINAL CLINICAL IMPRESSION(S) / ED DIAGNOSES   Final diagnoses:  Acute midline thoracic back pain  Left foot drop     Rx / DC Orders   ED Discharge Orders     None        Note:  This document was prepared using Dragon voice recognition software and may include unintentional dictation errors.   Chesley Noon, MD 10/24/23 [phone]

## 2023-10-24 NOTE — ED Provider Notes (Signed)
 Procedures     ----------------------------------------- 5:12 PM on 10/24/2023 -----------------------------------------  MRI is unremarkable except for known T12 compression fracture which is not causing an acute spinal process.  Discussed with neurosurgery Dr. Katrinka Blazing who advises this is most likely a peripheral peroneal nerve palsy.  Patient's exam is somewhat inconsistent and that she cannot reliably demonstrate normal function with her unaffected foot, but the patient does demonstrate a relative weakness of left foot dorsiflexion and eversion.  At times when she is distracted, she does demonstrate near normal left foot dorsiflexion.  Doubt stroke or acute spinal process.  She is stable for discharge.  Will refer to podiatry for orthotic evaluation for Achilles protection    Sharman Cheek, MD 10/24/23 1715

## 2023-10-24 NOTE — Telephone Encounter (Signed)
 Kathryn Wise was able to contact the patient this morning. She states that she has new foot drop and weakness. She will come to ED per conversation with [facility].

## 2023-10-24 NOTE — ED Triage Notes (Signed)
 Pt via POV c/o left foot numbness since she woke up this morning. She called her neurologist and was advised to come to ER for MRI. Pt denies all other symptoms. A/O x 4.

## 2023-10-27 DIAGNOSIS — Z23 Encounter for immunization: Secondary | ICD-10-CM | POA: Diagnosis not present

## 2023-10-28 ENCOUNTER — Ambulatory Visit (INDEPENDENT_AMBULATORY_CARE_PROVIDER_SITE_OTHER): Admitting: Physician Assistant

## 2023-10-28 VITALS — BP 120/78 | Ht 65.0 in | Wt 140.0 lb

## 2023-10-28 DIAGNOSIS — G8929 Other chronic pain: Secondary | ICD-10-CM

## 2023-10-28 DIAGNOSIS — J9611 Chronic respiratory failure with hypoxia: Secondary | ICD-10-CM | POA: Diagnosis not present

## 2023-10-28 DIAGNOSIS — S22080K Wedge compression fracture of T11-T12 vertebra, subsequent encounter for fracture with nonunion: Secondary | ICD-10-CM

## 2023-10-28 DIAGNOSIS — J1000 Influenza due to other identified influenza virus with unspecified type of pneumonia: Secondary | ICD-10-CM | POA: Diagnosis not present

## 2023-10-28 DIAGNOSIS — I110 Hypertensive heart disease with heart failure: Secondary | ICD-10-CM | POA: Diagnosis not present

## 2023-10-28 DIAGNOSIS — E871 Hypo-osmolality and hyponatremia: Secondary | ICD-10-CM | POA: Diagnosis not present

## 2023-10-28 DIAGNOSIS — W19XXXD Unspecified fall, subsequent encounter: Secondary | ICD-10-CM | POA: Diagnosis not present

## 2023-10-28 DIAGNOSIS — J208 Acute bronchitis due to other specified organisms: Secondary | ICD-10-CM | POA: Diagnosis not present

## 2023-10-28 DIAGNOSIS — J101 Influenza due to other identified influenza virus with other respiratory manifestations: Secondary | ICD-10-CM | POA: Diagnosis not present

## 2023-10-28 DIAGNOSIS — R937 Abnormal findings on diagnostic imaging of other parts of musculoskeletal system: Secondary | ICD-10-CM

## 2023-10-28 NOTE — Progress Notes (Unsigned)
 Follow-up note: Referring Physician:  Enid Baas, MD [address] [place],  [place] [address]  Primary Physician:  Enid Baas, MD  Chief Complaint: T12 compression fracture  History of Present Illness: Kathryn Wise is a 89 y.o. female who presents with known T12 compression fracture that occurred about 1 month ago after going to the ED for intractable back pain.  She is brought in today because of a call earlier this week in which she stated that she was unable to move her left foot.  She said at the time that this was an acute change and she was concerned.  She went to the emergency department again in which an MRI was completed.  She states currently her left ankle movement has improved although not normal.  She feels as though she is able to walk more today compared to yesterday although the caregiver that was present states that she definitely is having more trouble compared to normal.  Patient is not taking additional pain medication.  No changes to her bowel or bladder function.    Review of Systems:  A 10 point review of systems is negative, except for  the pertinent positives and negatives detailed in the HPI.  Past Medical History: Past Medical History:  Diagnosis Date   Arthritis    Collagen vascular disease (HCC)    Dysphagia, pharyngoesophageal phase 09/10/2013   Upper GI study with barium swallow was done at  [facility] Mar 2015   No reflux seen  Small irreducible hiatal hernia  Mild changes of presbyeophagus (abnormal contractions of the esophagus that occur with aging) No strictures Normal gastric emptying  Incomplete visualization of stomach fold due to patient's inability turn     GERD (gastroesophageal reflux disease)    Heart murmur    has had years and years   History of shingles Dec 2013   treated with steroids , post op from shoulder surgery   History of squamous cell carcinoma 05/02/2016   right mid lateral pretibial   Hypertension     Hypothyroidism    Major depressive disorder, single episode 11/05/2015   Osteoarthritis of left shoulder 07/14/2012   Pneumonia 10/21/2020   Squamous cell carcinoma of skin 05/12/2016   R mid lat pretibial - other skin cancers treated by Dr. Jarold Motto   Squamous cell carcinoma of skin 08/22/2020   left distal medial popliteal - [facility]    Past Surgical History: Past Surgical History:  Procedure Laterality Date   ARTHOSCOPIC ROTAOR CUFF REPAIR     LEFT  10+  YEARS     CAROTID PTA/STENT INTERVENTION Left 10/19/2020   Procedure: CAROTID PTA/STENT INTERVENTION;  Surgeon: Renford Dills, MD;  Location: [facility] INVASIVE CV LAB;  Service: Cardiovascular;  Laterality: Left;   CATARACT EXTRACTION W/ INTRAOCULAR LENS IMPLANT     RIGHT EYE   CATARACT EXTRACTION W/PHACO Left 03/28/2016   Procedure: CATARACT EXTRACTION PHACO AND INTRAOCULAR LENS PLACEMENT (IOC);  Surgeon: Lockie Mola, MD;  Location: [facility];  Service: Ophthalmology;  Laterality: Left;  TORIC   FACELIFT     FOOT SURGERY     rt foot   TUMOR REMOVED   JOINT REPLACEMENT Left 06/2012   shoulder   LEFT HEART CATH AND CORONARY ANGIOGRAPHY N/A 06/12/2017   Procedure: LEFT HEART CATH AND CORONARY ANGIOGRAPHY;  Surgeon: Antonieta Iba, MD;  Location: [facility] INVASIVE CV LAB;  Service: Cardiovascular;  Laterality: N/A;   RENAL ANGIOGRAPHY Left 01/07/2019   Procedure: RENAL ANGIOGRAPHY;  Surgeon:  Schnier, Latina Craver, MD;  Location: [facility] INVASIVE CV LAB;  Service: Cardiovascular;  Laterality: Left;   RENAL ARTERY STENT     SKIN CANCER EXCISION     TOTAL SHOULDER ARTHROPLASTY  07/14/2012   Procedure: TOTAL SHOULDER ARTHROPLASTY;  Surgeon: Eulas Post, MD;  Location: [facility] OR;  Service: Orthopedics;  Laterality: Left;    Allergies: Allergies as of 10/28/2023 - Review Complete 10/28/2023  Allergen Reaction Noted   Alendronate Swelling 10/08/2014   Lisinopril Other (See Comments) 11/03/2019   Codeine Other (See Comments)  12/07/2021   Oxycodone  07/09/2012   Oxycontin [oxycodone hcl]  07/09/2012    Medications: Outpatient Encounter Medications as of 10/28/2023  Medication Sig   amLODipine (NORVASC) 5 MG tablet Take 1 tablet (5 mg total) by mouth as needed (Once daily AS NEEDED for SBP (top blood pressure number) is greater than 160.).   bisacodyl (DULCOLAX) 5 MG EC tablet Take 1 tablet (5 mg total) by mouth daily as needed for moderate constipation.   bisoprolol (ZEBETA) 5 MG tablet Take 1 tablet (5 mg total) by mouth every other day.   Cholecalciferol (VITAMIN D-3) 1000 units CAPS Take 1,000 Units by mouth daily.   cyanocobalamin (VITAMIN B12) 1000 MCG tablet Take 1,000 mcg by mouth daily.   lactulose (CHRONULAC) 10 GM/15ML solution Take 45 mLs (30 g total) by mouth 2 (two) times daily as needed for mild constipation.   levothyroxine (SYNTHROID) 75 MCG tablet Take 75 mcg by mouth daily.   lidocaine (LIDODERM) 5 % Place 1-3 patches onto the skin daily. Remove & Discard patch within 12 hours or as directed by MD   nitroGLYCERIN (NITROSTAT) 0.4 MG SL tablet Place 1 tablet (0.4 mg total) under the tongue every 5 (five) minutes as needed for chest pain.   omeprazole (PRILOSEC) 20 MG capsule TAKE 1 CAPSULE(20 MG) BY MOUTH EVERY MORNING AS NEEDED FOR HEARTBURN   rosuvastatin (CRESTOR) 10 MG tablet Take 1 tablet (10 mg total) by mouth at bedtime.   temazepam (RESTORIL) 15 MG capsule Take 1 capsule (15 mg total) by mouth at bedtime as needed for sleep.   Turmeric 500 MG CAPS Take 500 mg by mouth daily.   No facility-administered encounter medications on file as of 10/28/2023.    Social History: Social History   Tobacco Use   Smoking status: Former    Current packs/day: 0.00    Types: Cigarettes    Quit date: 07/30/1972    Years since quitting: 51.2   Smokeless tobacco: Never   Tobacco comments:    smoked for only a few months  Vaping Use   Vaping status: Never Used  Substance Use Topics   Alcohol use: Yes     Comment: OCCAS WINE    Drug use: No    Family Medical History: Family History  Problem Relation Age of Onset   Cancer Daughter        Breast   Breast cancer Daughter 71   Cancer Son     Exam: @VITALWITHPAIN @  Patient is in good spirits.  Minimal tenderness to palpation of her thoracic and lumbar spine.  Some dorsiflexion weakness is noted in her left lower extremity and about a 3-4-/5.  Otherwise rest of her examination is to baseline.   Imaging: MRI Lumbar spine 10/24/23:  IMPRESSION: 1. Unchanged lumbar disc and facet degeneration with mild-to-moderate lateral recess and neural foraminal stenosis as above. 2. No lumbar fracture.    MRI thoracic spine 10/24/2023: IMPRESSION: Recent T12 burst  fracture with worsening edema and progressive height loss and retropulsion resulting in mild spinal stenosis.   MRI brain 10/24/2023: IMPRESSION: 1. No acute intracranial abnormality. 2. Mild chronic small vessel ischemic disease    I have personally reviewed the images and agree with the above interpretation.  Assessment and Plan: Ms. Flaim is a pleasant 89 y.o. female with known T12 compression fracture that she suffered a month ago who comes in today after new left lower extremity weakness was noted.  She went to the ED as directed for an MRI to rule out any major acute event.  She feels as though she is improved since yesterday and is able to be more ambulatory.  She adds that she feels as though she is in very little pain.  On examination she does have some tenderness to palpation of her thoracic and lumbar spine.  She does have new weakness in her left foot, specifically in dorsiflexion.  Her MRI of her thoracic and lumbar spine as well as her brain were reviewed at length.  Specifically, her T12 burst fracture does show worsening height loss and retropulsion compared to her previous imaging.  I spoke with the patient about decreasing a potential risk of a peroneal nerve  palsy with crossing her legs frequently.  In addition, we also went over her worsening fracture at length.  As well as concerns for her fall risk.  I suggested to the patient to wear a brace on her ankle to help with her dorsiflexion.  She was also counseled on a potential brace for her back to help with any back pain she is experiencing from her known fracture.  At this point in time patient at 89 years old states that she really does not want any intervention at all.  She declines wearing a brace on her foot or her back.  I discussed with her an EMG test, but she also declines this at this time.  The risk and benefits of conservative treatment without any intervention were discussed at length.  Her daughter was also on the phone over the course of this visit.  Red flag symptoms were reviewed at length including new incontinence and weakness in which she was instructed to go to the emergency department.  Patient wants to limit visits to the office as much as possible and I encouraged her to reach out to [place] for questions or concerns that she has in the future.  We are happy to see her at any time and answer any questions that she has.    I spent a total of 45 minutes in both face-to-face and non-face-to-face activities for this visit on the date of this encounter including reviewing her chart, most recent images from this week, obtaining new detailed history, completing examination, counseling the patient, caregiver, and her daughter.  As well as personally reviewing and interpreting the images and documenting.  Joan Flores PA-C Neurosurgery

## 2023-10-29 DIAGNOSIS — J101 Influenza due to other identified influenza virus with other respiratory manifestations: Secondary | ICD-10-CM | POA: Diagnosis not present

## 2023-10-29 DIAGNOSIS — J208 Acute bronchitis due to other specified organisms: Secondary | ICD-10-CM | POA: Diagnosis not present

## 2023-10-29 DIAGNOSIS — J9611 Chronic respiratory failure with hypoxia: Secondary | ICD-10-CM | POA: Diagnosis not present

## 2023-10-29 DIAGNOSIS — J1000 Influenza due to other identified influenza virus with unspecified type of pneumonia: Secondary | ICD-10-CM | POA: Diagnosis not present

## 2023-10-29 DIAGNOSIS — I110 Hypertensive heart disease with heart failure: Secondary | ICD-10-CM | POA: Diagnosis not present

## 2023-10-29 DIAGNOSIS — I5032 Chronic diastolic (congestive) heart failure: Secondary | ICD-10-CM | POA: Diagnosis not present

## 2023-10-29 DIAGNOSIS — E871 Hypo-osmolality and hyponatremia: Secondary | ICD-10-CM | POA: Diagnosis not present

## 2023-10-30 DIAGNOSIS — J1000 Influenza due to other identified influenza virus with unspecified type of pneumonia: Secondary | ICD-10-CM | POA: Diagnosis not present

## 2023-10-30 DIAGNOSIS — J208 Acute bronchitis due to other specified organisms: Secondary | ICD-10-CM | POA: Diagnosis not present

## 2023-10-30 DIAGNOSIS — E871 Hypo-osmolality and hyponatremia: Secondary | ICD-10-CM | POA: Diagnosis not present

## 2023-10-30 DIAGNOSIS — J9611 Chronic respiratory failure with hypoxia: Secondary | ICD-10-CM | POA: Diagnosis not present

## 2023-10-30 DIAGNOSIS — I110 Hypertensive heart disease with heart failure: Secondary | ICD-10-CM | POA: Diagnosis not present

## 2023-10-30 DIAGNOSIS — J101 Influenza due to other identified influenza virus with other respiratory manifestations: Secondary | ICD-10-CM | POA: Diagnosis not present

## 2023-11-06 DIAGNOSIS — E871 Hypo-osmolality and hyponatremia: Secondary | ICD-10-CM | POA: Diagnosis not present

## 2023-11-06 DIAGNOSIS — I110 Hypertensive heart disease with heart failure: Secondary | ICD-10-CM | POA: Diagnosis not present

## 2023-11-06 DIAGNOSIS — J101 Influenza due to other identified influenza virus with other respiratory manifestations: Secondary | ICD-10-CM | POA: Diagnosis not present

## 2023-11-06 DIAGNOSIS — J1000 Influenza due to other identified influenza virus with unspecified type of pneumonia: Secondary | ICD-10-CM | POA: Diagnosis not present

## 2023-11-06 DIAGNOSIS — J208 Acute bronchitis due to other specified organisms: Secondary | ICD-10-CM | POA: Diagnosis not present

## 2023-11-06 DIAGNOSIS — J9611 Chronic respiratory failure with hypoxia: Secondary | ICD-10-CM | POA: Diagnosis not present

## 2023-11-12 DIAGNOSIS — R2241 Localized swelling, mass and lump, right lower limb: Secondary | ICD-10-CM | POA: Diagnosis not present

## 2023-11-12 DIAGNOSIS — R21 Rash and other nonspecific skin eruption: Secondary | ICD-10-CM | POA: Diagnosis not present

## 2023-11-13 DIAGNOSIS — J208 Acute bronchitis due to other specified organisms: Secondary | ICD-10-CM | POA: Diagnosis not present

## 2023-11-13 DIAGNOSIS — J101 Influenza due to other identified influenza virus with other respiratory manifestations: Secondary | ICD-10-CM | POA: Diagnosis not present

## 2023-11-13 DIAGNOSIS — I110 Hypertensive heart disease with heart failure: Secondary | ICD-10-CM | POA: Diagnosis not present

## 2023-11-13 DIAGNOSIS — J9611 Chronic respiratory failure with hypoxia: Secondary | ICD-10-CM | POA: Diagnosis not present

## 2023-11-13 DIAGNOSIS — J1000 Influenza due to other identified influenza virus with unspecified type of pneumonia: Secondary | ICD-10-CM | POA: Diagnosis not present

## 2023-11-13 DIAGNOSIS — E871 Hypo-osmolality and hyponatremia: Secondary | ICD-10-CM | POA: Diagnosis not present

## 2023-11-14 DIAGNOSIS — E871 Hypo-osmolality and hyponatremia: Secondary | ICD-10-CM | POA: Diagnosis not present

## 2023-11-14 DIAGNOSIS — J9611 Chronic respiratory failure with hypoxia: Secondary | ICD-10-CM | POA: Diagnosis not present

## 2023-11-14 DIAGNOSIS — J1000 Influenza due to other identified influenza virus with unspecified type of pneumonia: Secondary | ICD-10-CM | POA: Diagnosis not present

## 2023-11-14 DIAGNOSIS — I110 Hypertensive heart disease with heart failure: Secondary | ICD-10-CM | POA: Diagnosis not present

## 2023-11-14 DIAGNOSIS — J101 Influenza due to other identified influenza virus with other respiratory manifestations: Secondary | ICD-10-CM | POA: Diagnosis not present

## 2023-11-14 DIAGNOSIS — J208 Acute bronchitis due to other specified organisms: Secondary | ICD-10-CM | POA: Diagnosis not present

## 2023-11-25 ENCOUNTER — Encounter: Payer: Self-pay | Admitting: Dermatology

## 2023-11-25 ENCOUNTER — Ambulatory Visit: Admitting: Dermatology

## 2023-11-25 DIAGNOSIS — L578 Other skin changes due to chronic exposure to nonionizing radiation: Secondary | ICD-10-CM

## 2023-11-25 DIAGNOSIS — Z7189 Other specified counseling: Secondary | ICD-10-CM

## 2023-11-25 DIAGNOSIS — B353 Tinea pedis: Secondary | ICD-10-CM | POA: Diagnosis not present

## 2023-11-25 DIAGNOSIS — D692 Other nonthrombocytopenic purpura: Secondary | ICD-10-CM | POA: Diagnosis not present

## 2023-11-25 DIAGNOSIS — L570 Actinic keratosis: Secondary | ICD-10-CM | POA: Diagnosis not present

## 2023-11-25 DIAGNOSIS — D1801 Hemangioma of skin and subcutaneous tissue: Secondary | ICD-10-CM

## 2023-11-25 DIAGNOSIS — D485 Neoplasm of uncertain behavior of skin: Secondary | ICD-10-CM

## 2023-11-25 DIAGNOSIS — L814 Other melanin hyperpigmentation: Secondary | ICD-10-CM

## 2023-11-25 DIAGNOSIS — C44722 Squamous cell carcinoma of skin of right lower limb, including hip: Secondary | ICD-10-CM | POA: Diagnosis not present

## 2023-11-25 DIAGNOSIS — L821 Other seborrheic keratosis: Secondary | ICD-10-CM

## 2023-11-25 DIAGNOSIS — L820 Inflamed seborrheic keratosis: Secondary | ICD-10-CM

## 2023-11-25 DIAGNOSIS — Z79899 Other long term (current) drug therapy: Secondary | ICD-10-CM

## 2023-11-25 DIAGNOSIS — D492 Neoplasm of unspecified behavior of bone, soft tissue, and skin: Secondary | ICD-10-CM | POA: Diagnosis not present

## 2023-11-25 DIAGNOSIS — W908XXA Exposure to other nonionizing radiation, initial encounter: Secondary | ICD-10-CM | POA: Diagnosis not present

## 2023-11-25 DIAGNOSIS — Z1283 Encounter for screening for malignant neoplasm of skin: Secondary | ICD-10-CM

## 2023-11-25 DIAGNOSIS — Z8589 Personal history of malignant neoplasm of other organs and systems: Secondary | ICD-10-CM

## 2023-11-25 MED ORDER — KETOCONAZOLE 2 % EX CREA
1.0000 | TOPICAL_CREAM | Freq: Every evening | CUTANEOUS | 5 refills | Status: AC
Start: 2023-11-25 — End: 2024-01-06

## 2023-11-25 MED ORDER — MUPIROCIN 2 % EX OINT: 1.0000 | TOPICAL_OINTMENT | Freq: Every day | CUTANEOUS | 0 refills | Status: DC

## 2023-11-25 NOTE — Patient Instructions (Addendum)
 Treatment Plan: Start ketoconazole  2% cream to feet and in between toes at bedtime  Cryotherapy Aftercare  Wash gently with soap and water everyday.   Apply Vaseline and Band-Aid daily until healed.   Wound Care Instructions  Cleanse wound gently with soap and water once a day then pat dry with clean gauze. May shower and get wet, pat dry. Apply a thin coat of mupirocin over the wound (unless you have an allergy to this). Do not pick or remove scabs. Do not remove the yellow or white "healing tissue" from the base of the wound.  Cover the wound with fresh, clean, nonstick gauze and secure with paper tape. You may use Band-Aids in place of gauze and tape if the wound is small enough, but would recommend trimming much of the tape off as there is often too much. Sometimes Band-Aids can irritate the skin.  You should call the office for your biopsy report after 1 week if you have not already been contacted.  If you experience any problems, such as abnormal amounts of bleeding, swelling, significant bruising, significant pain, or evidence of infection, please call the office immediately.  FOR ADULT SURGERY PATIENTS: If you need something for pain relief you may take 1 extra strength Tylenol  (acetaminophen ) AND 2 Ibuprofen  (200mg  each) together every 4 hours as needed for pain. (do not take these if you are allergic to them or if you have a reason you should not take them.) Typically, you may only need pain medication for 1 to 3 days.   Melanoma ABCDEs  Melanoma is the most dangerous type of skin cancer, and is the leading cause of death from skin disease.  You are more likely to develop melanoma if you: Have light-colored skin, light-colored eyes, or red or blond hair Spend a lot of time in the sun Tan regularly, either outdoors or in a tanning bed Have had blistering sunburns, especially during childhood Have a close family member who has had a melanoma Have atypical moles or large  birthmarks  Early detection of melanoma is key since treatment is typically straightforward and cure rates are extremely high if we catch it early.   The first sign of melanoma is often a change in a mole or a new dark spot.  The ABCDE system is a way of remembering the signs of melanoma.  A for asymmetry:  The two halves do not match. B for border:  The edges of the growth are irregular. C for color:  A mixture of colors are present instead of an even brown color. D for diameter:  Melanomas are usually (but not always) greater than 6mm - the size of a pencil eraser. E for evolution:  The spot keeps changing in size, shape, and color.  Please check your skin once per month between visits. You can use a small mirror in front and a large mirror behind you to keep an eye on the back side or your body.   If you see any new or changing lesions before your next follow-up, please call to schedule a visit.  Please continue daily skin protection including broad spectrum sunscreen SPF 30+ to sun-exposed areas, reapplying every 2 hours as needed when you're outdoors.    Due to recent changes in healthcare laws, you may see results of your pathology and/or laboratory studies on MyChart before the doctors have had a chance to review them. We understand that in some cases there may be results that are confusing or concerning  to you. Please understand that not all results are received at the same time and often the doctors may need to interpret multiple results in order to provide you with the best plan of care or course of treatment. Therefore, we ask that you please give us  2 business days to thoroughly review all your results before contacting the office for clarification. Should we see a critical lab result, you will be contacted sooner.   If You Need Anything After Your Visit  If you have any questions or concerns for your doctor, please call our main line at [phone] and press option 4 to reach  your doctor's medical assistant. If no one answers, please leave a voicemail as directed and we will return your call as soon as possible. Messages left after 4 pm will be answered the following business day.   You may also send us  a message via MyChart. We typically respond to MyChart messages within 1-2 business days.  For prescription refills, please ask your pharmacy to contact our office. Our fax number is [phone].  If you have an urgent issue when the clinic is closed that cannot wait until the next business day, you can page your doctor at the number below.    Please note that while we do our best to be available for urgent issues outside of office hours, we are not available 24/7.   If you have an urgent issue and are unable to reach us , you may choose to seek medical care at your doctor's office, retail clinic, urgent care center, or emergency room.  If you have a medical emergency, please immediately call 911 or go to the emergency department.  Pager Numbers  - Dr. Bary Likes: [phone]  - Dr. Annette Barters: [phone]  - Dr. Felipe Horton: [phone]   In the event of inclement weather, please call our main line at [phone] for an update on the status of any delays or closures.  Dermatology Medication Tips: Please keep the boxes that topical medications come in in order to help keep track of the instructions about where and how to use these. Pharmacies typically print the medication instructions only on the boxes and not directly on the medication tubes.   If your medication is too expensive, please contact our office at [phone] option 4 or send us  a message through MyChart.   We are unable to tell what your co-pay for medications will be in advance as this is different depending on your insurance coverage. However, we may be able to find a substitute medication at lower cost or fill out paperwork to get insurance to cover a needed medication.   If a prior authorization  is required to get your medication covered by your insurance company, please allow us  1-2 business days to complete this process.  Drug prices often vary depending on where the prescription is filled and some pharmacies may offer cheaper prices.  The website www.goodrx.com contains coupons for medications through different pharmacies. The prices here do not account for what the cost may be with help from insurance (it may be cheaper with your insurance), but the website can give you the price if you did not use any insurance.  - You can print the associated coupon and take it with your prescription to the pharmacy.  - You may also stop by our office during regular business hours and pick up a GoodRx coupon card.  - If you need your prescription sent electronically to a different pharmacy, notify our office through  [facility] MyChart or by phone at [phone] option 4.     Si Usted Necesita Algo Despus de Su Visita  Tambin puede enviarnos un mensaje a travs de Clinical cytogeneticist. Por lo general respondemos a los mensajes de MyChart en el transcurso de 1 a 2 das hbiles.  Para renovar recetas, por favor pida a su farmacia que se ponga en contacto con nuestra oficina. Franz Jacks de fax es [place] [phone].  Si tiene un asunto urgente cuando la clnica est cerrada y que no puede esperar hasta el siguiente da hbil, puede llamar/localizar a su doctor(a) al nmero que aparece a continuacin.   Por favor, tenga en cuenta que aunque hacemos todo lo posible para estar disponibles para asuntos urgentes fuera del horario de [place], no estamos disponibles las 24 horas del da, los 7 [address]  [address] de la [place].   Si tiene un problema urgente y no puede comunicarse con nosotros, puede optar por buscar atencin mdica  en el consultorio de su doctor(a), en una clnica privada, en un centro de atencin urgente o en una sala de emergencias.  Si tiene Engineer, drilling, por favor llame inmediatamente al 911 o  vaya a la sala de emergencias.  Nmeros de bper  - Dr. Bary Likes: [phone]  - Dra. Annette Barters: [phone]  - Dr. Felipe Horton: [phone]   En caso de inclemencias del tiempo, por favor llame a Lajuan Pila principal al [phone] para una actualizacin sobre el [place] de cualquier retraso o cierre.  Consejos para la medicacin en dermatologa: Por favor, guarde las cajas en las que vienen los medicamentos de uso tpico para ayudarle a seguir las instrucciones sobre dnde y cmo usarlos. Las farmacias generalmente imprimen las instrucciones del medicamento slo en las cajas y no directamente en los tubos del [place].   Si su medicamento es muy caro, por favor, pngase en contacto con Bettyjane Brunet llamando al [phone] y presione la opcin 4 o envenos un mensaje a travs de Clinical cytogeneticist.   No podemos decirle cul ser su copago por los medicamentos por adelantado ya que esto es diferente dependiendo de la cobertura de su seguro. Sin embargo, es posible que podamos encontrar un medicamento sustituto a Audiological scientist un formulario para que el seguro cubra el medicamento que se considera necesario.   Si se requiere una autorizacin previa para que su compaa de seguros [place] su medicamento, por favor permtanos de 1 a 2 das hbiles para completar este proceso.  Los precios de los medicamentos varan con frecuencia dependiendo del Environmental consultant de dnde se surte la receta y alguna farmacias pueden ofrecer precios ms baratos.  El sitio web www.goodrx.com tiene cupones para medicamentos de Health and safety inspector. Los precios aqu no tienen en cuenta lo que podra costar con la ayuda del seguro (puede ser ms barato con su seguro), pero el sitio web puede darle el precio si no utiliz Tourist information centre manager.  - Puede imprimir el cupn correspondiente y llevarlo con su receta a la farmacia.  - Tambin puede pasar por nuestra oficina durante el horario de atencin regular y Education officer, museum una tarjeta de cupones  de GoodRx.  - Si necesita que su receta se enve electrnicamente a una farmacia diferente, informe a nuestra oficina a travs de MyChart de [place] o por telfono llamando al [phone] y presione la opcin 4.

## 2023-11-25 NOTE — Progress Notes (Signed)
 Follow-Up Visit   Subjective  Kathryn Wise is a 89 y.o. female who presents for the following: Skin Cancer Screening and Full Body Skin Exam  The patient presents for Total-Body Skin Exam (TBSE) for skin cancer screening and mole check. The patient has spots, moles and lesions to be evaluated, some may be new or changing and the patient may have concern these could be cancer.  Hx SCC, Patient has a spot at right lower leg.   The following portions of the chart were reviewed this encounter and updated as appropriate: medications, allergies, medical history  Review of Systems:  No other skin or systemic complaints except as noted in HPI or Assessment and Plan.  Objective  Well appearing patient in no apparent distress; mood and affect are within normal limits.  A full examination was performed including scalp, head, eyes, ears, nose, lips, neck, chest, axillae, abdomen, back, buttocks, bilateral upper extremities, bilateral lower extremities, hands, feet, fingers, toes, fingernails, and toenails. All findings within normal limits unless otherwise noted below.   Relevant physical exam findings are noted in the Assessment and Plan.  right distal lateral pretibial above the ankle Crusted papule 1.2 cm  Nose x 4, L ankle x 1 (5) Erythematous thin papules/macules with gritty scale.  Upper back spinal x 1, L ankle x 1 (2) Erythematous stuck-on, waxy papule or plaque  Assessment & Plan   SKIN CANCER SCREENING PERFORMED TODAY.  ACTINIC DAMAGE - Chronic condition, secondary to cumulative UV/sun exposure - diffuse scaly erythematous macules with underlying dyspigmentation - Recommend daily broad spectrum sunscreen SPF 30+ to sun-exposed areas, reapply every 2 hours as needed.  - Staying in the shade or wearing long sleeves, sun glasses (UVA+UVB protection) and wide brim hats (4-inch brim around the entire circumference of the hat) are also recommended for sun protection.  - Call for  new or changing lesions.  LENTIGINES, SEBORRHEIC KERATOSES, HEMANGIOMAS - Benign normal skin lesions - Benign-appearing - Call for any changes  MELANOCYTIC NEVI - Tan-brown and/or pink-flesh-colored symmetric macules and papules - Benign appearing on exam today - Observation - Call clinic for new or changing moles - Recommend daily use of broad spectrum spf 30+ sunscreen to sun-exposed areas.   HISTORY OF SQUAMOUS CELL CARCINOMA OF THE SKIN - No evidence of recurrence today - No lymphadenopathy - Recommend regular full body skin exams - Recommend daily broad spectrum sunscreen SPF 30+ to sun-exposed areas, reapply every 2 hours as needed.  - Call if any new or changing lesions are noted between office visits  Purpura - Chronic; persistent and recurrent.  Treatable, but not curable. - Violaceous macules and patches at arms - Benign - Related to trauma, age, sun damage and/or use of blood thinners, chronic use of topical and/or oral steroids - Observe - Can use OTC arnica containing moisturizer such as Dermend Bruise Formula if desired - Call for worsening or other concerns  TINEA PEDIS Exam: Scaling and maceration web spaces and over distal and lateral soles. Chronic and persistent condition with duration or expected duration over one year. Condition is symptomatic / bothersome to patient. Not to goal. Treatment Plan: Start ketoconazole  2% cream to feet and in between toes at bedtime  NEOPLASM OF UNCERTAIN BEHAVIOR OF SKIN right distal lateral pretibial above the ankle Epidermal / dermal shaving  Lesion diameter (cm):  1.2 Informed consent: discussed and consent obtained   Timeout: patient name, date of birth, surgical site, and procedure verified   Procedure prep:  Patient was prepped and draped in usual sterile fashion Prep type:  Isopropyl alcohol Anesthesia: the lesion was anesthetized in a standard fashion   Anesthetic:  1% lidocaine  w/ epinephrine  1-100,000 buffered  w/ 8.4% NaHCO3 Instrument used: flexible razor blade   Hemostasis achieved with: pressure, aluminum chloride and electrodesiccation   Outcome: patient tolerated procedure well   Post-procedure details: sterile dressing applied and wound care instructions given   Dressing type: bandage and petrolatum    Destruction of lesion Complexity: extensive   Destruction method: electrodesiccation and curettage   Informed consent: discussed and consent obtained   Timeout:  patient name, date of birth, surgical site, and procedure verified Procedure prep:  Patient was prepped and draped in usual sterile fashion Prep type:  Isopropyl alcohol Anesthesia: the lesion was anesthetized in a standard fashion   Anesthetic:  1% lidocaine  w/ epinephrine  1-100,000 buffered w/ 8.4% NaHCO3 Curettage performed in three different directions: Yes   Electrodesiccation performed over the curetted area: Yes   Final wound size (cm):  1.2 Hemostasis achieved with:  pressure, aluminum chloride and electrodesiccation Outcome: patient tolerated procedure well with no complications   Post-procedure details: sterile dressing applied and wound care instructions given   Dressing type: bandage and petrolatum   Specimen 1 - Surgical pathology Differential Diagnosis: r/o SCC  Check Margins: No Crusted papule Treated with EDC AK (ACTINIC KERATOSIS) (5) Nose x 4, L ankle x 1 (5) Actinic keratoses are precancerous spots that appear secondary to cumulative UV radiation exposure/sun exposure over time. They are chronic with expected duration over 1 year. A portion of actinic keratoses will progress to squamous cell carcinoma of the skin. It is not possible to reliably predict which spots will progress to skin cancer and so treatment is recommended to prevent development of skin cancer.  Recommend daily broad spectrum sunscreen SPF 30+ to sun-exposed areas, reapply every 2 hours as needed.  Recommend staying in the shade or wearing  long sleeves, sun glasses (UVA+UVB protection) and wide brim hats (4-inch brim around the entire circumference of the hat). Call for new or changing lesions. Destruction of lesion - Nose x 4, L ankle x 1 (5) Complexity: simple   Destruction method: cryotherapy   Informed consent: discussed and consent obtained   Timeout:  patient name, date of birth, surgical site, and procedure verified Lesion destroyed using liquid nitrogen: Yes   Region frozen until ice ball extended beyond lesion: Yes   Outcome: patient tolerated procedure well with no complications   Post-procedure details: wound care instructions given   INFLAMED SEBORRHEIC KERATOSIS (2) Upper back spinal x 1, L ankle x 1 (2) Symptomatic, irritating, patient would like treated.  Benign-appearing.  Call clinic for new or changing lesions.   Destruction of lesion - Upper back spinal x 1, L ankle x 1 (2) Complexity: simple   Destruction method: cryotherapy   Informed consent: discussed and consent obtained   Timeout:  patient name, date of birth, surgical site, and procedure verified Lesion destroyed using liquid nitrogen: Yes   Region frozen until ice ball extended beyond lesion: Yes   Outcome: patient tolerated procedure well with no complications   Post-procedure details: wound care instructions given   TINEA PEDIS OF BOTH FEET   COUNSELING AND COORDINATION OF CARE   MEDICATION MANAGEMENT   ACTINIC SKIN DAMAGE   SKIN CANCER SCREENING   HISTORY OF SQUAMOUS CELL CARCINOMA   PURPURA (HCC)   Return in about 1 year (around 11/24/2024) for TBSE, with  Dr. Linnell Richardson, HxSCC, HxAK.  Kerstin Peeling, RMA, am acting as scribe for Celine Collard, MD .   Documentation: I have reviewed the above documentation for accuracy and completeness, and I agree with the above.  Celine Collard, MD

## 2023-12-02 ENCOUNTER — Encounter: Payer: Self-pay | Admitting: Dermatology

## 2023-12-02 DIAGNOSIS — S22080A Wedge compression fracture of T11-T12 vertebra, initial encounter for closed fracture: Secondary | ICD-10-CM | POA: Diagnosis not present

## 2023-12-02 LAB — SURGICAL PATHOLOGY

## 2023-12-03 ENCOUNTER — Telehealth: Payer: Self-pay

## 2023-12-03 NOTE — Telephone Encounter (Signed)
 Advised pt of bx results.  Pt said area was red and "pulsey".  Patient is cleaing with soap and water then applying Mupirocin  ointment.  Advised pt that sometimes the bandage can cause redness around the wound.  And the wound itself can have some yellow healing tissue.  Advised pt if she was concerned about area healing we could have her come into have checked.  Patient said she will see how area does today and if looks worse will call tomorrow to get an appointment to have checked.Kathryn Wise

## 2023-12-03 NOTE — Telephone Encounter (Signed)
-----   Message from Celine Collard sent at 12/02/2023  5:15 PM EDT ----- FINAL DIAGNOSIS        1. Skin, right distal lateral pretibial above the ankle :       WELL DIFFERENTIATED SQUAMOUS CELL CARCINOMA   Cancer = SCC Already treated Recheck next visit

## 2023-12-17 ENCOUNTER — Encounter (INDEPENDENT_AMBULATORY_CARE_PROVIDER_SITE_OTHER): Payer: Self-pay

## 2023-12-19 DIAGNOSIS — R630 Anorexia: Secondary | ICD-10-CM | POA: Diagnosis not present

## 2023-12-19 DIAGNOSIS — I4892 Unspecified atrial flutter: Secondary | ICD-10-CM | POA: Diagnosis not present

## 2023-12-19 DIAGNOSIS — I739 Peripheral vascular disease, unspecified: Secondary | ICD-10-CM | POA: Diagnosis not present

## 2023-12-19 DIAGNOSIS — I951 Orthostatic hypotension: Secondary | ICD-10-CM | POA: Diagnosis not present

## 2023-12-19 DIAGNOSIS — I421 Obstructive hypertrophic cardiomyopathy: Secondary | ICD-10-CM | POA: Diagnosis not present

## 2023-12-19 DIAGNOSIS — R1084 Generalized abdominal pain: Secondary | ICD-10-CM | POA: Diagnosis not present

## 2023-12-19 DIAGNOSIS — K219 Gastro-esophageal reflux disease without esophagitis: Secondary | ICD-10-CM | POA: Diagnosis not present

## 2023-12-24 ENCOUNTER — Encounter: Payer: Self-pay | Admitting: Internal Medicine

## 2023-12-25 ENCOUNTER — Other Ambulatory Visit: Payer: Self-pay | Admitting: Internal Medicine

## 2023-12-25 DIAGNOSIS — S3011XA Contusion of abdominal wall, initial encounter: Secondary | ICD-10-CM

## 2023-12-25 DIAGNOSIS — R1084 Generalized abdominal pain: Secondary | ICD-10-CM

## 2023-12-25 DIAGNOSIS — S301XXA Contusion of abdominal wall, initial encounter: Secondary | ICD-10-CM

## 2023-12-26 ENCOUNTER — Ambulatory Visit
Admission: RE | Admit: 2023-12-26 | Discharge: 2023-12-26 | Disposition: A | Discharge status: Home / Self Care | Source: Ambulatory Visit | Attending: Internal Medicine | Admitting: Internal Medicine

## 2023-12-26 DIAGNOSIS — S301XXA Contusion of abdominal wall, initial encounter: Secondary | ICD-10-CM | POA: Insufficient documentation

## 2023-12-26 DIAGNOSIS — I700 Atherosclerosis of aorta: Secondary | ICD-10-CM | POA: Diagnosis not present

## 2023-12-26 DIAGNOSIS — K8689 Other specified diseases of pancreas: Secondary | ICD-10-CM | POA: Diagnosis not present

## 2023-12-26 DIAGNOSIS — R109 Unspecified abdominal pain: Secondary | ICD-10-CM | POA: Diagnosis not present

## 2023-12-26 DIAGNOSIS — R1084 Generalized abdominal pain: Secondary | ICD-10-CM | POA: Diagnosis not present

## 2023-12-26 MED ORDER — IOHEXOL 350 MG/ML SOLN
80.0000 mL | Freq: Once | INTRAVENOUS | Status: AC | PRN
  Administered 2023-12-26: 80 mL via INTRAVENOUS

## 2023-12-30 DIAGNOSIS — S22080D Wedge compression fracture of T11-T12 vertebra, subsequent encounter for fracture with routine healing: Secondary | ICD-10-CM | POA: Diagnosis not present

## 2024-01-18 NOTE — Progress Notes (Unsigned)
 Cardiology Office Note    Date:  01/20/2024   ID:  Kathryn Wise, DOB [birth year], MRN [id number]  PCP:  Kathryn Bail, MD  Cardiologist:  Kathryn Hanson, MD  Electrophysiologist:  None   Chief Complaint: Follow up  History of Present Illness:   Kathryn Wise is a 89 y.o. female with history of nonobstructive CAD, HOCM, HFpEF, difficult to control hypertension with left renal artery stenosis status post stenting in 12/2018, carotid artery disease status post left sided stenting in 09/2020, possible small SDH noted in 10/2021, LBBB, abdominis rectus hematoma in the setting of coughing, HLD, and mitral regurgitation who presents for follow-up of HOCM.   Ms. Kathryn Wise was previously followed by Dr. Monette in 2017 for cardiac murmur with echo at that time demonstrating an EF of 60 to 65%, normal wall motion, mild focal basal and mild concentric LVH of the septum with mild LVOT gradient, grade 1 diastolic dysfunction, mildly dilated aortic root and ascending aorta, mild mitral regurgitation, normal RV systolic function and PASP.  She was admitted for chest pain in 05/2017 with Lexiscan  being abnormal.  She subsequently underwent LHC in 05/2017, which demonstrated nonobstructive CAD involving the LCx and RCA with medical management recommended.  Echo in 05/2017 showed an EF of 55 to 60%, normal wall motion, mild focal basal and mild concentric LVH, near cavity obliteration in systole, grade 1 diastolic dysfunction, very mild aortic stenosis, moderate mitral regurgitation, mildly dilated left atrium, normal RV systolic function, and a PASP of 50 mmHg.  Following this, she was lost to follow-up.   She was admitted to the hospital in 09/2020 with pneumonia and HFpEF.  Echo demonstrated an EF of 55 to 60%, moderate LVH, grade 2 diastolic dysfunction with moderate LVOT obstruction with a peak gradient of 68 mmHg at rest and 74 mmHg with Valsalva, normal RV systolic function and ventricular cavity size,  moderately elevated PASP estimated at 46.5 mmHg, moderately dilated left atrium, mild mitral regurgitation, mild mitral stenosis, moderate mitral annular calcification, and mild to moderate aortic valve sclerosis without evidence of stenosis.  High-sensitivity troponin trended to 976.  She was lost to follow-up.   She was admitted in 09/2021 with hypertensive urgency with noted improvement in BP following 10 mg of amlodipine  and 5 mg of IV labetalol .  She was noted to have a mildly elevated high-sensitivity troponin peaking at 36 felt to be related to supply demand ischemia from hypertension.   She was admitted to the hospital in 10/2021 noting left upper extremity numbness that was transient, bilateral lower extremity swelling, and generalized weakness.  She was consulted on by neurosurgery for possible TIA.  MRI of the brain showed a possible 2 mm subdural hematoma overlying the right cerebral convexity.  This was not well visualized on CT imaging.  She was unaware of any fall/trauma.  There was recommended the patient continue aspirin  without need for antiepileptic medication given lack of subdural hematoma noted on CT imaging.  Echo, as read by outside group, showed an EF of 50 to 55%, no regional wall motion normalities, mild concentric LVH, grade 1 diastolic dysfunction, normal RV systolic function and ventricular cavity size, myxomatous mitral valve with mild stenosis, and mild aortic insufficiency.  Lower extremity ultrasound was negative for DVT bilaterally.  High-sensitivity troponin 28 with a delta troponin of 29.  BNP 418.   She was seen in hospital follow-up on 11/20/2021 and was without symptoms of angina or decompensation at that time.  She did  report an approximate 4-week history of left jaw pain, and in the setting had multiple teeth extracted along that side without symptomatic improvement.  This discomfort was exacerbated by exertion, chewing, and yawning.  Subsequent Lexiscan  MPI on 11/27/2021  was overall low risk without evidence of infarction or ischemia.  CT attenuated corrected images showed mild coronary artery calcification and aortic atherosclerosis.  Echo on 12/07/2021 demonstrated an EF of 60 to 65%, no regional wall motion abnormalities, moderate LVH with severe basal septal hypertrophy with an LVOT gradient of 39 mmHg at rest and 52 mmHg with Valsalva with one measurement of 127 mmHg, normal RV systolic function and ventricular cavity size, mildly dilated left atrium, moderate mitral valve regurgitation with severe mitral annular calcification, and aortic valve sclerosis without evidence of stenosis.   She was seen in the office on 12/28/2021 and was without symptoms of angina or decompensation.  She did feel somewhat deconditioned as she had not been able to exercise regularly.  She was without symptoms of dizziness, presyncope, or syncope.  Her blood pressure was improved following the addition of Cardizem  CD.  We subsequently titrated this to 240 mg daily.  She subsequently reported issues with pain all over, noting that slight changes led to a flareup of polymyalgia.  Given this, it was recommended she resume lower dose Cardizem  180 mg daily.  She subsequently discontinued this medication altogether indicating she was feeling poorly.  She was later diagnosed with a sinus infection.  We recommended she resume Cardizem  180 mg daily due to elevated BP.   She was seen in the office in 01/2022 noting diffuse myalgias and limited mobility while on diltiazem .  She underwent intermittent washing out of this medication with improvement in symptoms while she was off diltiazem .  However, she did continue to take diltiazem  with intermittent elevated blood pressure readings.  She requested a prednisone  taper for her myalgias.  We transitioned her to low-dose carvedilol  3.125 mg twice daily.  She subsequently notified our office in 02/2022 that carvedilol  was working well for her blood pressure,  though causing fatigue and weakness leading it to be discontinued.  She was placed back on amlodipine  10 mg daily, which she previously tolerated without issues.     She was seen in the office in 03/2022 noting an increase in bilateral lower extremity swelling that began after reinitiating amlodipine .  She noticed an improvement in her energy following discontinuation of carvedilol .  She preferred to remain on amlodipine  10 mg daily.  She was started on furosemide  20 mg daily with follow-up labs showing stable renal function.   She was seen by her PCP 08/17/2022 for elevated blood pressure and placed on hydralazine .  While at the hairdresser, later that day, while sitting on the hair dryer, she developed sudden onset of tachypalpitations with associated dizziness and near syncope.  No frank syncope.  Symptoms lasted a few seconds and spontaneously resolved.  She drove herself home.  While at home, she had another episode of tachypalpitations associated near syncope that persisted a little longer prompting her to contact EMS.  She never had chest pain or frank syncope.  She was admitted to [facility] in 07/2022 with atrial tachycardia and near syncope in the setting of HOCM.  High-sensitivity troponin peaked at 305 and was felt to represent demand ischemia in the setting of atrial tachycardia with known HOCM.  BNP 710.  With initiation of bisoprolol  there was documented improvement in tachy-palpitation burden.  Patient discharged on bisoprolol  5 mg  daily.   She was last seen in the office in 07/2022 and had tolerated the addition of bisoprolol  without issue.  She subsequently contacted our office concerned about asymptomatic heart rates in the 50s bpm.  In this setting, bisoprolol  was reduced to 2.5 mg daily.   She was evaluated in the ED on 11/04/2022 with elevated BP readings with a BP of 219/95.  High-sensitivity troponin mildly elevated and flat trending peaking at 44.  She was given 5 mg of IV hydralazine .   She  was seen in the office on 11/30/2022 continuing to note fluctuations in her blood pressure ranging from the 120s to 190s systolic.  She did not believe as needed hydralazine  had helped her much and that at times resumed as needed amlodipine  5 mg.  She preferred to transition back to amlodipine  on an as needed basis.  She was taking bisoprolol  2.5 mg every other day.   She was admitted to the hospital in 02/2023 with acute left pyelonephritis with E. coli UTI.  High-sensitivity troponin trended to 511, which was felt to be related to supply/demand ischemia in the setting of hypertrophic cardiomyopathy and pyelonephritis.  At discharge, bisoprolol  was titrated to 5 mg 2.5 daily, however patient preferred to remain on prior dose of bisoprolol  2.5 mg every other day.   She was seen in the office in 03/2023 and was without angina or cardiac decompensation.  Chronic fatigue was stable.  Blood pressure at home was well-controlled in the 1 teens to 130s.  She was taking bisoprolol  2.5 mg every other day and had not needed any as needed amlodipine .   She was seen in the office in 06/2023 and remained without symptoms of angina or cardiac decompensation.  She was active at baseline without cardiac limitation.  She was continued on bisoprolol  2.5 mg every other day, preferring to not make changes at that time.   She was diagnosed with acute bronchitis in 08/2023 and was treated with 2 rounds of antibiotics and steroids.  With this diagnosis that she had significant coughing burden.   She was admitted to the hospital from 09/22/2023 through 09/24/2023 with left lower quadrant abdominal pain and was found to have an acute left lower rectus muscle hemorrhage with active intramuscular bleeding and expansion of the muscle and early extension of the hematoma into the space of the rectus.  IR and general surgery were consulted with recommendation for conservative management and pain control as the source of bleeding from a small  caliber muscle branch artery that was challenging to reach and perform embolization.  Initial hemoglobin of 10.6 downtrending to 9.2.  Admission was complicated by syncopal episode felt to be due to orthostatic hypotension with CT head negative for acute intracranial trauma.  In this setting, PTA bisoprolol  was held.   She was readmitted on 09/25/2023 through 09/30/2023 with generalized weakness and back pain.  She was found to have a T12 burst fracture with conservative management advised.  Admission was further complicated by progressive hyponatremia, progressive acute blood loss anemia with hemoglobin trending to 7.4, and UTI.  During the admission she required supplemental oxygen  and was discharged with this.  She was discharged to inpatient rehab.  She was admitted to the hospital in 09/2023 after presenting to our office noting increased oxygen  requirement with significant shortness of breath and poor oral intake.  She was subsequently admitted to the hospital with influenza infection.  She returned to the ED 2 days later with generalized weakness and nausea  that was felt to be due to Augmentin  that was prescribed during her previous hospitalization.  She was last seen in our office on 10/16/2023 noting her breathing was back to baseline and was without symptoms of angina or cardiac decompensation.  Her only complaint at that time was persistent back pain related to T12 burst fracture with neurosurgery opting for conservative management.  She was seen in the ED in 09/2023 with numbness and weakness in the left foot.  MRI of the brain showed no acute intracranial abnormality.  MRI of the thoracic and lumbar spine showed known T12 burst fracture with worsening edema and progressive height loss and retropulsion resulting in mild spinal stenosis with lumbar spine MRI showing unchanged lumbar disc and facet degeneration with mild to moderate lateral recess and neural foraminal stenosis without acute  fracture.  CTA of the abdomen/pelvis in 11/2023 showed near complete resolution of previously observed hematoma within the left rectus abdominis, patent right renal artery stent, no evidence of active arterial extravasation, stable appearance of suspected cystic neoplasm within the pancreas, known T12 compression fracture.  She comes in and is without symptoms of angina or cardiac decompensation.  No palpitations, dizziness, presyncope, or syncope.  She continues to be significantly limited by back pain.  No significant lower extremity swelling or progressive orthopnea.  No further falls.  No symptoms concerning for bleeding.  Has not needed any as needed amlodipine .  Remains off aspirin  at this time.   Labs independently reviewed: 11/2023 - potassium 5.2, BUN 20, serum creatinine 1 09/2023 - albumin 3.4, AST/ALT normal, Hgb 9.4, PLT 202, TSH 9.62, free T4 normal, magnesium 2.3 11/2022 - TC 133, TG 81, HDL 52, LDL 64, A1c 5.9   Past Medical History:  Diagnosis Date   Arthritis    Collagen vascular disease (HCC)    Dysphagia, pharyngoesophageal phase 09/10/2013   Upper GI study with barium swallow was done at  [facility] Mar 2015   No reflux seen  Small irreducible hiatal hernia  Mild changes of presbyeophagus (abnormal contractions of the esophagus that occur with aging) No strictures Normal gastric emptying  Incomplete visualization of stomach fold due to patient's inability turn     GERD (gastroesophageal reflux disease)    Heart murmur    has had years and years   History of shingles 06/2012   treated with steroids , post op from shoulder surgery   History of squamous cell carcinoma 05/02/2016   right mid lateral pretibial   Hypertension    Hypothyroidism    Major depressive disorder, single episode 11/05/2015   Osteoarthritis of left shoulder 07/14/2012   Pneumonia 10/21/2020   Squamous cell carcinoma of skin 05/12/2016   R mid lat pretibial - other skin cancers treated by Dr. Jakie    Squamous cell carcinoma of skin 08/22/2020   left distal medial popliteal - [facility]   Squamous cell carcinoma of skin 11/25/2023   SCC IS, right distal lateral pretibial above the ankle, [facility]    Past Surgical History:  Procedure Laterality Date   ARTHOSCOPIC ROTAOR CUFF REPAIR     LEFT  10+  YEARS     CAROTID PTA/STENT INTERVENTION Left 10/19/2020   Procedure: CAROTID PTA/STENT INTERVENTION;  Surgeon: Jama Cordella MATSU, MD;  Location: [facility] INVASIVE CV LAB;  Service: Cardiovascular;  Laterality: Left;   CATARACT EXTRACTION W/ INTRAOCULAR LENS IMPLANT     RIGHT EYE   CATARACT EXTRACTION W/PHACO Left 03/28/2016   Procedure: CATARACT EXTRACTION PHACO AND INTRAOCULAR LENS PLACEMENT (  IOC);  Surgeon: Dene Etienne, MD;  Location: [facility];  Service: Ophthalmology;  Laterality: Left;  TORIC   FACELIFT     FOOT SURGERY     rt foot   TUMOR REMOVED   JOINT REPLACEMENT Left 06/2012   shoulder   LEFT HEART CATH AND CORONARY ANGIOGRAPHY N/A 06/12/2017   Procedure: LEFT HEART CATH AND CORONARY ANGIOGRAPHY;  Surgeon: Perla Evalene PARAS, MD;  Location: [facility] INVASIVE CV LAB;  Service: Cardiovascular;  Laterality: N/A;   RENAL ANGIOGRAPHY Left 01/07/2019   Procedure: RENAL ANGIOGRAPHY;  Surgeon: Jama Cordella MATSU, MD;  Location: [facility] INVASIVE CV LAB;  Service: Cardiovascular;  Laterality: Left;   RENAL ARTERY STENT     SKIN CANCER EXCISION     TOTAL SHOULDER ARTHROPLASTY  07/14/2012   Procedure: TOTAL SHOULDER ARTHROPLASTY;  Surgeon: Fonda SHAUNNA Olmsted, MD;  Location: [facility] OR;  Service: Orthopedics;  Laterality: Left;    Current Medications: Current Meds  Medication Sig   amLODipine  (NORVASC ) 5 MG tablet Take 1 tablet (5 mg total) by mouth as needed (Once daily AS NEEDED for SBP (top blood pressure number) is greater than 160.).   bisacodyl  (DULCOLAX) 5 MG EC tablet Take 1 tablet (5 mg total) by mouth daily as needed for moderate constipation.   bisoprolol  (ZEBETA ) 5 MG tablet Take 1 tablet  (5 mg total) by mouth every other day.   Cholecalciferol  (VITAMIN D -3) 1000 units CAPS Take 1,000 Units by mouth daily.   cyanocobalamin  (VITAMIN B12) 1000 MCG tablet Take 1,000 mcg by mouth daily.   lactulose  (CHRONULAC ) 10 GM/15ML solution Take 45 mLs (30 g total) by mouth 2 (two) times daily as needed for mild constipation.   levothyroxine  (SYNTHROID ) 75 MCG tablet Take 75 mcg by mouth daily.   lidocaine  (LIDODERM ) 5 % Place 1-3 patches onto the skin daily. Remove & Discard patch within 12 hours or as directed by MD   mupirocin  ointment (BACTROBAN ) 2 % Apply 1 Application topically daily.   nitroGLYCERIN  (NITROSTAT ) 0.4 MG SL tablet Place 1 tablet (0.4 mg total) under the tongue every 5 (five) minutes as needed for chest pain.   omeprazole  (PRILOSEC) 20 MG capsule TAKE 1 CAPSULE(20 MG) BY MOUTH EVERY MORNING AS NEEDED FOR HEARTBURN   rosuvastatin  (CRESTOR ) 10 MG tablet Take 1 tablet (10 mg total) by mouth at bedtime.   temazepam  (RESTORIL ) 15 MG capsule Take 1 capsule (15 mg total) by mouth at bedtime as needed for sleep.   Turmeric 500 MG CAPS Take 500 mg by mouth daily.    Allergies:   Alendronate, Lisinopril , Codeine, Oxycodone, and Oxycontin [oxycodone hcl]   Social History   Socioeconomic History   Marital status: Widowed    Spouse name: Not on file   Number of children: Not on file   Years of education: Not on file   Highest education level: Not on file  Occupational History   Not on file  Tobacco Use   Smoking status: Former    Current packs/day: 0.00    Types: Cigarettes    Quit date: 07/30/1972    Years since quitting: 51.5   Smokeless tobacco: Never   Tobacco comments:    smoked for only a few months  Vaping Use   Vaping status: Never Used  Substance and Sexual Activity   Alcohol use: Yes    Comment: OCCAS WINE    Drug use: No   Sexual activity: Never  Other Topics Concern   Not on file  Social History  Narrative   Not on file   Social Drivers of Health    Financial Resource Strain: Low Risk  (10/21/2023)   Received from [facility]   Overall Financial Resource Strain (CARDIA)    Difficulty of Paying Living Expenses: Not hard at all  Food Insecurity: No Food Insecurity (10/21/2023)   Received from [facility]   Hunger Vital Sign    Within the past 12 months, you worried that your food would run out before you got the money to buy more.: Never true    Within the past 12 months, the food you bought just didn't last and you didn't have money to get more.: Never true  Transportation Needs: No Transportation Needs (10/21/2023)   Received from [facility] - Transportation    In the past 12 months, has lack of transportation kept you from medical appointments or from getting medications?: No    Lack of Transportation (Non-Medical): No  Physical Activity: Sufficiently Active (04/20/2020)   Exercise Vital Sign    Days of Exercise per Week: 4 days    Minutes of Exercise per Session: 60 min  Stress: No Stress Concern Present (04/20/2020)   [employer] of Occupational Health - Occupational Stress Questionnaire    Feeling of Stress : Not at all  Social Connections: Moderately Isolated (10/10/2023)   Social Connection and Isolation Panel    Frequency of Communication with Friends and Family: More than three times a week    Frequency of Social Gatherings with Friends and Family: Once a week    Attends Religious Services: 1 to 4 times per year    Active Member of [employer] or Organizations: No    Attends Banker Meetings: Never    Marital Status: Widowed     Family History:  The patient's family history includes Breast cancer (age of onset: 33) in her daughter; Cancer in her daughter and son.  ROS:   12-point review of systems is negative unless otherwise noted in the HPI.   EKGs/Labs/Other Studies Reviewed:    Studies reviewed were summarized above. The additional  studies were reviewed today:  2D echo 08/05/2023: 1. Left ventricular ejection fraction, by estimation, is 60 to 65%. The  left ventricle has normal function. The left ventricle has no regional  wall motion abnormalities. There is moderate concentric left ventricular  hypertrophy, severe basal septal wall   hypertrophy . LVOT gradient estimated at 99 mm HG with valsalva. Left  ventricular diastolic parameters are consistent with Grade I diastolic  dysfunction (impaired relaxation).   2. Right ventricular systolic function is normal. The right ventricular  size is normal. There is normal pulmonary artery systolic pressure. The  estimated right ventricular systolic pressure is 32.9 mmHg.   3. Left atrial size was severely dilated.   4. The mitral valve is normal in structure. Mild to moderate mitral valve  regurgitation. No evidence of mitral stenosis. Severe mitral annular  calcification.   5. The aortic valve is calcified. Aortic valve regurgitation is not  visualized. Aortic valve sclerosis/calcification is present, without any  evidence of aortic stenosis.   6. The inferior vena cava is normal in size with greater than 50%  respiratory variability, suggesting right atrial pressure of 3 mmHg.  __________   Limited echo 12/07/2021: 1. Left ventricular ejection fraction, by estimation, is 60 to 65%. The  left ventricle has normal function. The left ventricle has no regional  wall motion abnormalities.  There is moderat   2. There is moderate left ventricular hypertrophy with severe basal  septal hypertrophy. LVOT gradient noted, 39 mm Hg at rest, up to 52 mm Hg  with valsalva. One measurement estimating gradient at 127 mm Hg.   3. Right ventricular systolic function is normal. The right ventricular  size is normal. Tricuspid regurgitation signal is inadequate for assessing  PA pressure.   4. Left atrial size was mildly dilated.   5. The mitral valve is normal in structure. Moderate  mitral valve  regurgitation. Severe mitral annular calcification.   6. The aortic valve is normal in structure. Aortic valve regurgitation is  not visualized. Aortic valve sclerosis/calcification is present, without  any evidence of aortic stenosis.   Comparison(s): 11/13/21 echo measured LVOT Vmax as 156 cm/s. __________   Lexiscan  MPI 11/27/2021:   The study is normal. The study is low risk.   No ST deviation was noted.   LV perfusion is normal. There is no evidence of ischemia. There is no evidence of infarction.   Left ventricular function is normal. End diastolic cavity size is normal. End systolic cavity size is normal.   Suboptimal study due to GI uptake.   CT attenuation images with mild aortic and coronary calcification. __________   2D echo 11/13/2021:  1. Left ventricular ejection fraction, by estimation, is 50 to 55%. The  left ventricle has low normal function. The left ventricle has no regional  wall motion abnormalities. There is mild concentric left ventricular  hypertrophy. Left ventricular  diastolic parameters are consistent with Grade I diastolic dysfunction  (impaired relaxation).   2. Right ventricular systolic function is normal. The right ventricular  size is normal.   3. The mitral valve is myxomatous. No evidence of mitral valve  regurgitation. Mild mitral stenosis.   4. The aortic valve is normal in structure. Aortic valve regurgitation is  mild. __________   2D echo 06/26/2017: - Left ventricle: The cavity size was normal. Near cavity    obliteration in systole. There was mild focal basal and mild    concentric hypertrophy of the septum. Systolic function was    normal. The estimated ejection fraction was in the range of 55%    to 60%. Wall motion was normal; there were no regional wall    motion abnormalities. Doppler parameters are consistent with    abnormal left ventricular relaxation (grade 1 diastolic    dysfunction).  - Aortic valve:  Transvalvular velocity was increased. There was    very mild stenosis.  - Mitral valve: Calcified annulus. There was moderate    regurgitation.  - Left atrium: The atrium was mildly dilated.  - Right ventricle: Systolic function was normal.  - Pulmonary arteries: Systolic pressure was moderately elevated. PA    peak pressure: 50 mm Hg (S). __________  [facility] 06/12/2017: Coronary dominance: Right  Left mainstem:   Large vessel that bifurcates into the LAD and left circumflex, Ostial 30% disease, calcified.  Left anterior descending (LAD):   Large vessel that extends to the apical region, diagonal branch 2 of moderate size, mild lumenal irregularities  Left circumflex (LCx):  Large vessel with OM branch 2, moderate 50% mid vessel disease, calcified  Right coronary artery (RCA):  Right dominant vessel with PL and PDA, moderate focal disease, eccentric 50 to 60%  Left ventriculography: Left ventricular systolic function is normal/hyperdynamic, LVEF is estimated at 55-65%, there is no significant mitral regurgitation , no significant aortic valve stenosis  Final Conclusions:  Moderate mid LCX and RCA disease, Medical management recommended Normal/hyperdynamic function  Recommendations:  Follow up on echocardiogram Start metoprolol  tartrate 25 mg po BID __________   Lexiscan  MPI 06/11/2017: Abnormal, potentially high risk myocardial perfusion stress test. There is a moderate in size, mild in severity, reversible defect involving the mid inferolateral, apical lateral, and apical segments consistent with ischemia. There is a small in size, mild in severity, reversible defect involving the mid anterior segment, which may represent subtle ischemia and/or shifting breast attenuation. The left ventricular ejection fraction is normal (64%). Transient ischemic dilation is noted (TID 1.31), which is non-specific but can be seen balanced/multivessel ischemia. __________   2D echo  12/05/2015: - Left ventricle: The cavity size was normal. There was mild focal    basal and mild concentric hypertrophy of the septum, with mild    LVOT gradient. Systolic function was normal. The estimated    ejection fraction was in the range of 60% to 65%. Wall motion was    normal; there were no regional wall motion abnormalities. Doppler    parameters are consistent with abnormal left ventricular    relaxation (grade 1 diastolic dysfunction).  - Aorta: Aortic root with milldy dilated, dimension: 35 mm (ED).    Ascending aorta was mildly dilated, diameter: 35 mm (S).  - Mitral valve: There was mild regurgitation.  - Left atrium: The atrium was normal in size.  - Right ventricle: Systolic function was normal.  - Pulmonary arteries: Systolic pressure was within the normal    range.  - Inferior vena cava: The vessel was normal in size. The    respirophasic diameter changes were in the normal range (>= 50%),    consistent with normal central venous pressure.   Impressions:   - Murmur likely secondary to aortic valve sclerosis without    significant stenosis, and mild LVOT gradient.   EKG:  EKG is not ordered today.    Recent Labs: 10/08/2023: Magnesium 2.3 10/14/2023: B Natriuretic Peptide 994.1; TSH 9.620 10/24/2023: ALT 13; BUN 11; Creatinine, Ser 0.95; Hemoglobin 9.4; Platelets 202; Potassium 3.6; Sodium 136  Recent Lipid Panel    Component Value Date/Time   CHOL 115 11/14/2021 0538   TRIG 91 11/14/2021 0538   HDL 42 11/14/2021 0538   CHOLHDL 2.7 11/14/2021 0538   VLDL 18 11/14/2021 0538   LDLCALC 55 11/14/2021 0538   LDLDIRECT 126.0 09/19/2016 1411    PHYSICAL EXAM:    VS:  BP (!) 146/56   Pulse 62   Ht 5' 5 (1.651 m)   Wt 141 lb 3.2 oz (64 kg)   SpO2 98%   BMI 23.50 kg/m   BMI: Body mass index is 23.5 kg/m.  Physical Exam Vitals reviewed.  Constitutional:      Appearance: She is well-developed.  HENT:     Head: Normocephalic and atraumatic.   Eyes:      General:        Right eye: No discharge.        Left eye: No discharge.    Cardiovascular:     Rate and Rhythm: Normal rate and regular rhythm.     Heart sounds: S1 normal and S2 normal. Heart sounds not distant. No midsystolic click and no opening snap. Murmur heard.     Systolic murmur is present with a grade of 3/6.     No friction rub.  Pulmonary:     Effort: Pulmonary effort is normal. No respiratory distress.     Breath  sounds: Normal breath sounds. No decreased breath sounds, wheezing, rhonchi or rales.  Chest:     Chest wall: No tenderness.   Musculoskeletal:     Cervical back: Normal range of motion.     Right lower leg: No edema.     Left lower leg: No edema.   Skin:    General: Skin is warm and dry.     Nails: There is no clubbing.   Neurological:     Mental Status: She is alert and oriented to person, place, and time.   Psychiatric:        Speech: Speech normal.        Behavior: Behavior normal.        Thought Content: Thought content normal.        Judgment: Judgment normal.     Wt Readings from Last 3 Encounters:  01/20/24 141 lb 3.2 oz (64 kg)  10/28/23 140 lb (63.5 kg)  10/24/23 140 lb (63.5 kg)     ASSESSMENT & PLAN:   HOCM/HFpEF with LBBB: No symptoms of presyncope or syncope.  Continue bisoprolol  5 mg every other day, has not tolerated daily dosing, even at lower dosages.  Target heart rate in the 50s to 60s bpm.  Intolerant to carvedilol , metoprolol , and diltiazem .  She has previously indicated first-degree relatives have been screened. Given advanced age and comorbid conditions, she has preferred to follow a conservative approach with declination of referral to HOCM clinic, cardiac MRI, or referral to EP for consideration of ICD.   Atrial tachycardia: Quiescent.  Remains on bisoprolol  as above.  Renovascular labile hypertension: Is mildly elevated in the office today, though typically well-controlled.  Suspect this may be in the setting of ongoing  back pain.  Remains on bisoprolol  5 mg every other day, and as needed amlodipine .  Mitral valve regurgitation: Mild to moderate mitral regurgitation on echo in 07/2023.  Follow with periodic echo.  Renal artery and carotid artery stenosis: Status post right renal artery stenting with ultrasound from 04/2023 showed 1 to 59% bilateral renal artery stenosis.  Carotid artery ultrasound from 04/2023 showed 1 to 39% carotid artery stenosis bilaterally.  Anticipate initiating aspirin  81 mg daily if CBC is stable.  Remains on rosuvastatin  as outlined below.  Followed by vascular surgery.   Rectus sheath hematoma: Resolved on recent CTA abdomen/pelvis.  Check CBC.  HLD: LDL 64 in 11/2022.  Remains on rosuvastatin  10 mg.  T12 burst fracture: Main limiting factor at this time.  Conservatively managed by neurosurgery.  No red flag symptoms.      Disposition: F/u with Dr. Mady or an APP in 6 months.   Medication Adjustments/Labs and Tests Ordered: Current medicines are reviewed at length with the patient today.  Concerns regarding medicines are outlined above. Medication changes, Labs and Tests ordered today are summarized above and listed in the Patient Instructions accessible in Encounters.   Signed, Bernardino Bring, PA-C 01/20/2024 2:35 PM     [facility] [place] [address] [place], [place] [address] [phone]

## 2024-01-20 ENCOUNTER — Encounter: Payer: Self-pay | Admitting: Physician Assistant

## 2024-01-20 ENCOUNTER — Ambulatory Visit: Attending: Physician Assistant | Admitting: Physician Assistant

## 2024-01-20 VITALS — BP 146/56 | HR 62 | Ht 65.0 in | Wt 141.2 lb

## 2024-01-20 DIAGNOSIS — I421 Obstructive hypertrophic cardiomyopathy: Secondary | ICD-10-CM | POA: Diagnosis not present

## 2024-01-20 DIAGNOSIS — I4719 Other supraventricular tachycardia: Secondary | ICD-10-CM | POA: Insufficient documentation

## 2024-01-20 DIAGNOSIS — S301XXD Contusion of abdominal wall, subsequent encounter: Secondary | ICD-10-CM | POA: Diagnosis not present

## 2024-01-20 DIAGNOSIS — I5032 Chronic diastolic (congestive) heart failure: Secondary | ICD-10-CM | POA: Diagnosis not present

## 2024-01-20 DIAGNOSIS — I340 Nonrheumatic mitral (valve) insufficiency: Secondary | ICD-10-CM | POA: Diagnosis not present

## 2024-01-20 DIAGNOSIS — I150 Renovascular hypertension: Secondary | ICD-10-CM | POA: Diagnosis not present

## 2024-01-20 DIAGNOSIS — S22081A Stable burst fracture of T11-T12 vertebra, initial encounter for closed fracture: Secondary | ICD-10-CM | POA: Insufficient documentation

## 2024-01-20 DIAGNOSIS — I447 Left bundle-branch block, unspecified: Secondary | ICD-10-CM | POA: Diagnosis not present

## 2024-01-20 DIAGNOSIS — E785 Hyperlipidemia, unspecified: Secondary | ICD-10-CM | POA: Diagnosis not present

## 2024-01-20 DIAGNOSIS — Z79899 Other long term (current) drug therapy: Secondary | ICD-10-CM | POA: Insufficient documentation

## 2024-01-20 NOTE — Patient Instructions (Signed)
 Medication Instructions:  Your physician recommends that you continue on your current medications as directed. Please refer to the Current Medication list given to you today.   *If you need a refill on your cardiac medications before your next appointment, please call your pharmacy*  Lab Work: Your provider would like for you to have following labs drawn today CBC and BMP.   If you have labs (blood work) drawn today and your tests are completely normal, you will receive your results only by: MyChart Message (if you have MyChart) OR A paper copy in the mail If you have any lab test that is abnormal or we need to change your treatment, we will call you to review the results.  Follow-Up: At [facility], you and your health needs are our priority.  As part of our continuing mission to provide you with exceptional heart care, our providers are all part of one team.  This team includes your primary Cardiologist (physician) and Advanced Practice Providers or APPs (Physician Assistants and Nurse Practitioners) who all work together to provide you with the care you need, when you need it.  Your next appointment:   6 month(s)  Provider:   You may see Lonni Hanson, MD or Bernardino Bring, PA-C  We recommend signing up for the patient portal called MyChart.  Sign up information is provided on this After Visit Summary.  MyChart is used to connect with patients for Virtual Visits (Telemedicine).  Patients are able to view lab/test results, encounter notes, upcoming appointments, etc.  Non-urgent messages can be sent to your provider as well.   To learn more about what you can do with MyChart, go to [email or web address].

## 2024-01-21 ENCOUNTER — Ambulatory Visit: Payer: Self-pay | Admitting: Physician Assistant

## 2024-01-21 DIAGNOSIS — Z79899 Other long term (current) drug therapy: Secondary | ICD-10-CM

## 2024-01-21 LAB — CBC
Hematocrit: 35.1 % (ref 34.0–46.6)
Hemoglobin: 11.1 g/dL (ref 11.1–15.9)
MCH: 26.1 pg — ABNORMAL LOW (ref 26.6–33.0)
MCHC: 31.6 g/dL (ref 31.5–35.7)
MCV: 82 fL (ref 79–97)
Platelets: 167 10*3/uL (ref 150–450)
RBC: 4.26 x10E6/uL (ref 3.77–5.28)
RDW: 15 % (ref 11.7–15.4)
WBC: 6.7 10*3/uL (ref 3.4–10.8)

## 2024-01-21 LAB — BASIC METABOLIC PANEL WITH GFR
BUN/Creatinine Ratio: 17 (ref 12–28)
BUN: 19 mg/dL (ref 10–36)
CO2: 23 mmol/L (ref 20–29)
Calcium: 9.5 mg/dL (ref 8.7–10.3)
Chloride: 98 mmol/L (ref 96–106)
Creatinine, Ser: 1.1 mg/dL — ABNORMAL HIGH (ref 0.57–1.00)
Glucose: 131 mg/dL — ABNORMAL HIGH (ref 70–99)
Potassium: 5.5 mmol/L — ABNORMAL HIGH (ref 3.5–5.2)
Sodium: 133 mmol/L — ABNORMAL LOW (ref 134–144)
eGFR: 47 mL/min/{1.73_m2} — ABNORMAL LOW (ref >59)

## 2024-01-21 MED ORDER — ASPIRIN 81 MG PO TBEC: 81.0000 mg | DELAYED_RELEASE_TABLET | Freq: Every day | ORAL | Status: AC

## 2024-01-29 ENCOUNTER — Ambulatory Visit: Admitting: Dermatology

## 2024-01-29 DIAGNOSIS — Z85828 Personal history of other malignant neoplasm of skin: Secondary | ICD-10-CM

## 2024-01-29 DIAGNOSIS — L820 Inflamed seborrheic keratosis: Secondary | ICD-10-CM | POA: Diagnosis not present

## 2024-01-29 DIAGNOSIS — L578 Other skin changes due to chronic exposure to nonionizing radiation: Secondary | ICD-10-CM | POA: Diagnosis not present

## 2024-01-29 DIAGNOSIS — Z8589 Personal history of malignant neoplasm of other organs and systems: Secondary | ICD-10-CM

## 2024-01-29 DIAGNOSIS — Z7189 Other specified counseling: Secondary | ICD-10-CM

## 2024-01-29 DIAGNOSIS — L98491 Non-pressure chronic ulcer of skin of other sites limited to breakdown of skin: Secondary | ICD-10-CM

## 2024-01-29 DIAGNOSIS — W908XXA Exposure to other nonionizing radiation, initial encounter: Secondary | ICD-10-CM | POA: Diagnosis not present

## 2024-01-29 DIAGNOSIS — L97209 Non-pressure chronic ulcer of unspecified calf with unspecified severity: Secondary | ICD-10-CM | POA: Diagnosis not present

## 2024-01-29 NOTE — Progress Notes (Signed)
   Follow-Up Visit   Subjective  MAREA Wise is a 89 y.o. female who presents for the following: recheck SCC R distal lat pretibial, bx and EDC 11/25/23, blisters L lower leg, present since last visit 11/25/23, pt has been using Mupirocin  and vaseline, sore  Patient accompanied by care giver.  The following portions of the chart were reviewed this encounter and updated as appropriate: medications, allergies, medical history  Review of Systems:  No other skin or systemic complaints except as noted in HPI or Assessment and Plan.  Objective  Well appearing patient in no apparent distress; mood and affect are within normal limits.   A focused examination was performed of the following areas: Lower legs  Relevant exam findings are noted in the Assessment and Plan.  L pretibial x 4 (4) Stuck on waxy paps with erythema  Assessment & Plan   HEALING SCC SITE R distal lat pretibial Exam: healing EDC site.  Crust with ulcer Treatment Plan: Mechanical debridement performed today Wound cleansed with Puracyn, Mupirocin  applied followed by telfa and coban  INFLAMED SEBORRHEIC KERATOSIS (4) L pretibial x 4 (4) With Impetiginization Symptomatic, irritating, patient would like treated.  Start Mupirocin  oint qd to aa L pretibial (patient has at home) Destruction of lesion - L pretibial x 4 (4) Complexity: simple   Destruction method: cryotherapy   Informed consent: discussed and consent obtained   Timeout:  patient name, date of birth, surgical site, and procedure verified Lesion destroyed using liquid nitrogen: Yes   Region frozen until ice ball extended beyond lesion: Yes   Outcome: patient tolerated procedure well with no complications   Post-procedure details: wound care instructions given    NON-PRESSURE CHRONIC ULCER OF SKIN OF OTHER SITES LIMITED TO BREAKDOWN OF SKIN (HCC)   ACTINIC SKIN DAMAGE   HISTORY OF SQUAMOUS CELL CARCINOMA    ACTINIC DAMAGE - chronic,  secondary to cumulative UV radiation exposure/sun exposure over time - diffuse scaly erythematous macules with underlying dyspigmentation - Recommend daily broad spectrum sunscreen SPF 30+ to sun-exposed areas, reapply every 2 hours as needed.  - Recommend staying in the shade or wearing long sleeves, sun glasses (UVA+UVB protection) and wide brim hats (4-inch brim around the entire circumference of the hat). - Call for new or changing lesions.  Return for as scheduled.  I, Grayce Saunas, RMA, am acting as scribe for [name], MD .   Documentation: I have reviewed the above documentation for accuracy and completeness, and I agree with the above.  [name], MD

## 2024-01-29 NOTE — Patient Instructions (Addendum)
 Cryotherapy Aftercare  Wash gently with soap and water everyday.   Apply Vaseline and Band-Aid daily until healed.    Start Mupirocin  ointment daily to left lower leg until redness is resolved.   Due to recent changes in healthcare laws, you may see results of your pathology and/or laboratory studies on MyChart before the doctors have had a chance to review them. We understand that in some cases there may be results that are confusing or concerning to you. Please understand that not all results are received at the same time and often the doctors may need to interpret multiple results in order to provide you with the best plan of care or course of treatment. Therefore, we ask that you please give us  2 business days to thoroughly review all your results before contacting the office for clarification. Should we see a critical lab result, you will be contacted sooner.   If You Need Anything After Your Visit  If you have any questions or concerns for your doctor, please call our main line at [phone] and press option 4 to reach your doctor's medical assistant. If no one answers, please leave a voicemail as directed and we will return your call as soon as possible. Messages left after 4 pm will be answered the following business day.   You may also send us  a message via MyChart. We typically respond to MyChart messages within 1-2 business days.  For prescription refills, please ask your pharmacy to contact our office. Our fax number is [phone].  If you have an urgent issue when the clinic is closed that cannot wait until the next business day, you can page your doctor at the number below.    Please note that while we do our best to be available for urgent issues outside of office hours, we are not available 24/7.   If you have an urgent issue and are unable to reach us , you may choose to seek medical care at your doctor's office, retail clinic, urgent care center, or emergency room.  If  you have a medical emergency, please immediately call 911 or go to the emergency department.  Pager Numbers  - Dr. Hester: [phone]  - Dr. Jackquline: [phone]  - Dr. Claudene: [phone]   In the event of inclement weather, please call our main line at [phone] for an update on the status of any delays or closures.  Dermatology Medication Tips: Please keep the boxes that topical medications come in in order to help keep track of the instructions about where and how to use these. Pharmacies typically print the medication instructions only on the boxes and not directly on the medication tubes.   If your medication is too expensive, please contact our office at [phone] option 4 or send us  a message through MyChart.   We are unable to tell what your co-pay for medications will be in advance as this is different depending on your insurance coverage. However, we may be able to find a substitute medication at lower cost or fill out paperwork to get insurance to cover a needed medication.   If a prior authorization is required to get your medication covered by your insurance company, please allow us  1-2 business days to complete this process.  Drug prices often vary depending on where the prescription is filled and some pharmacies may offer cheaper prices.  The website www.goodrx.com contains coupons for medications through different pharmacies. The prices here do not account for what the cost may be with  help from insurance (it may be cheaper with your insurance), but the website can give you the price if you did not use any insurance.  - You can print the associated coupon and take it with your prescription to the pharmacy.  - You may also stop by our office during regular business hours and pick up a GoodRx coupon card.  - If you need your prescription sent electronically to a different pharmacy, notify our office through [facility] or by phone at [phone] option  4.     Si Usted Necesita Algo Despus de Su Visita  Tambin puede enviarnos un mensaje a travs de Clinical cytogeneticist. Por lo general respondemos a los mensajes de MyChart en el transcurso de 1 a 2 das hbiles.  Para renovar recetas, por favor pida a su farmacia que se ponga en contacto con nuestra oficina. Randi lakes de fax es [place] [phone].  Si tiene un asunto urgente cuando la clnica est cerrada y que no puede esperar hasta el siguiente da hbil, puede llamar/localizar a su doctor(a) al nmero que aparece a continuacin.   Por favor, tenga en cuenta que aunque hacemos todo lo posible para estar disponibles para asuntos urgentes fuera del horario de [place], no estamos disponibles las 24 horas del da, los 7 [address]  [address] de la [place].   Si tiene un problema urgente y no puede comunicarse con nosotros, puede optar por buscar atencin mdica  en el consultorio de su doctor(a), en una clnica privada, en un centro de atencin urgente o en una sala de emergencias.  Si tiene Engineer, drilling, por favor llame inmediatamente al 911 o vaya a la sala de emergencias.  Nmeros de bper  - Dr. Hester: [phone]  - Dra. Jackquline: [phone]  - Dr. Claudene: [phone]   En caso de inclemencias del tiempo, por favor llame a landry capes principal al [phone] para una actualizacin sobre el [place] de cualquier retraso o cierre.  Consejos para la medicacin en dermatologa: Por favor, guarde las cajas en las que vienen los medicamentos de uso tpico para ayudarle a seguir las instrucciones sobre dnde y cmo usarlos. Las farmacias generalmente imprimen las instrucciones del medicamento slo en las cajas y no directamente en los tubos del [place].   Si su medicamento es muy caro, por favor, pngase en contacto con landry rieger llamando al [phone] y presione la opcin 4 o envenos un mensaje a travs de Clinical cytogeneticist.   No podemos decirle cul ser su copago por los medicamentos por  adelantado ya que esto es diferente dependiendo de la cobertura de su seguro. Sin embargo, es posible que podamos encontrar un medicamento sustituto a Audiological scientist un formulario para que el seguro cubra el medicamento que se considera necesario.   Si se requiere una autorizacin previa para que su compaa de seguros malta su medicamento, por favor permtanos de 1 a 2 das hbiles para completar este proceso.  Los precios de los medicamentos varan con frecuencia dependiendo del Environmental consultant de dnde se surte la receta y alguna farmacias pueden ofrecer precios ms baratos.  El sitio web www.goodrx.com tiene cupones para medicamentos de Health and safety inspector. Los precios aqu no tienen en cuenta lo que podra costar con la ayuda del seguro (puede ser ms barato con su seguro), pero el sitio web puede darle el precio si no utiliz Tourist information centre manager.  - Puede imprimir el cupn correspondiente y llevarlo con su receta a la farmacia.  - Tambin puede pasar por landry oficina durante  el horario de atencin regular y Education officer, museum una tarjeta de cupones de GoodRx.  - Si necesita que su receta se enve electrnicamente a una farmacia diferente, informe a nuestra oficina a travs de MyChart de [place] o por telfono llamando al [phone] y presione la opcin 4.

## 2024-02-04 ENCOUNTER — Encounter: Payer: Self-pay | Admitting: Dermatology

## 2024-02-10 DIAGNOSIS — S22080D Wedge compression fracture of T11-T12 vertebra, subsequent encounter for fracture with routine healing: Secondary | ICD-10-CM | POA: Diagnosis not present

## 2024-02-20 DIAGNOSIS — R1084 Generalized abdominal pain: Secondary | ICD-10-CM | POA: Diagnosis not present

## 2024-02-20 DIAGNOSIS — R1314 Dysphagia, pharyngoesophageal phase: Secondary | ICD-10-CM | POA: Diagnosis not present

## 2024-02-20 NOTE — Telephone Encounter (Signed)
 Called daughter, Marval, that sent [facility], left message to return call to the office

## 2024-02-20 NOTE — Telephone Encounter (Signed)
 Talked to patient, she states she has been working out and does exercises with her caregiver. Recently she has been experiencing some sob and over exertion, her caregivers believe it is from not doing as much lately and now increasingly getting back into exercise and movement. Pt stated she thinks she is okay for now and will probably call to schedule a sooner follow up appt, as her last appt was for a 12 month follow up. Patient verbalized understanding and would call back if she needed anything else.

## 2024-02-20 NOTE — Telephone Encounter (Signed)
 Daughter returned call - advised that the RX had not been changed by Bernardino Bring, PA, recommended contacting pharmacy to see which dosage was given at last fill. Daughter will send [facility] to let us  know which dosage was given

## 2024-03-06 DIAGNOSIS — R058 Other specified cough: Secondary | ICD-10-CM | POA: Diagnosis not present

## 2024-03-06 DIAGNOSIS — R0602 Shortness of breath: Secondary | ICD-10-CM | POA: Diagnosis not present

## 2024-03-06 DIAGNOSIS — R0981 Nasal congestion: Secondary | ICD-10-CM | POA: Diagnosis not present

## 2024-03-06 DIAGNOSIS — R0789 Other chest pain: Secondary | ICD-10-CM | POA: Diagnosis not present

## 2024-03-06 NOTE — Progress Notes (Signed)
 History of Present Illness:   Kathryn Wise is a 89 y.o. female here for   Verbally consented to the use of AI for note-taking.   Chief Complaint  Patient presents with  . Cough    History of Present Illness Kathryn Wise is a 89 year old female with a history of pneumonia who presents with symptoms of a cold and concerns about pneumonia.  She has been experiencing symptoms of a cold since Monday, which worsened by Wednesday, including cough, congestion, and production of brown sputum. No fevers or chills are present, but the symptoms are primarily in her chest.  She experiences shortness of breath and chest tightness, raising her concern about developing pneumonia. She has been using Nyquil for symptom relief but finds it insufficient for clearing her chest. She also mentions having an albuterol  inhaler at home but is unsure of its location and has not used it recently.  No nausea or vomiting and no recent contact with sick individuals, although she does have someone who visits her on Tuesdays. She is generally healthy and [facility]  She has difficulty swallowing large pills and prefers capsules or liquid forms of medication. She has tried cough drops for her cough.  During the review of symptoms, she reports a sore throat and denies any ear pain, although her ears have been draining.   Past Medical History:   Past Medical History:  Diagnosis Date  . Anemia   . Arrhythmia   . Arthritis   . CHF (congestive heart failure) (CMS/HHS-HCC) 2022   Not sure this is accurate as doctors keep revisiting the cause of my issues  . Depression   . GERD (gastroesophageal reflux disease)   . Heart murmur 1990   always had it  . History of cataract 2018   had surgery both eyes  . Hyperlipidemia   . Hypertension   . Neuropathy   . Osteoporosis   . Polymyalgia rheumatica (CMS/HHS-HCC)   . Thyroid  disease     Past Surgical History:   Past Surgical History:   Procedure Laterality Date  . ARTHROSCOPIC ROTATOR CUFF REPAIR    . CATARACT EXTRACTION    . FRACTURE SURGERY     shoulder  . pins in left elbow    . renal artery stint      Allergies:   Allergies  Allergen Reactions  . Alendronate Swelling    And nausea  And nausea  And nausea  And nausea  And nausea  And nausea    . Lisinopril  Unknown    Spike in BP Spike in BP   . Metoprolol  Other (See Comments)    lethargy Other reaction(s): Other (See Comments), Other (See Comments) lethargy Lethargy - PT CURRENTLY TAKING  Lethargy - PT CURRENTLY TAKING    . Oxycodone Unknown and Other (See Comments)    Other reaction(s): Unknown Other reaction(s): Other (See Comments) Altered mental status Altered mental status Altered mental status    Current Medications:   Prior to Admission medications  Medication Sig Taking? Last Dose  albuterol  MDI, PROVENTIL , VENTOLIN , PROAIR , HFA 90 mcg/actuation inhaler Inhale 2 inhalations into the lungs 3 (three) times daily for 10 days Yes   amLODIPine  (NORVASC ) 5 MG tablet Take 5 mg by mouth as needed Taking if systolic is over 834 Yes Taking  bisoprolol  (ZEBETA ) 5 MG tablet Take 5 mg by mouth every other day Yes Taking  cholecalciferol  (VITAMIN D3) 1,000 unit capsule Take by mouth Yes  Taking  cyanocobalamin , vitamin B-12, 500 mcg Chew Take by mouth Yes Taking  levothyroxine  (SYNTHROID ) 75 MCG tablet TAKE 1 TABLET(75 MCG) BY MOUTH DAILY 30 TO 60 MINUTES BEFORE BREAKFAST ON AN EMPTY STOMACH AND WITH A GLASS OF WATER Yes Taking  lidocaine  (LIDODERM ) 5 % patch Place 1 patch onto the skin once daily Yes Taking  MAGNESIUM ORAL Take by mouth Yes Taking  omeprazole  (PRILOSEC) 20 MG DR capsule Take 1 capsule (20 mg total) by mouth once daily Yes Taking  polyethylene glycol (MIRALAX ) packet Take 17 g by mouth once daily as needed Yes Taking  propylene glycol (SYSTANE BALANCE) 0.6 % ophthalmic drops Place 1 drop into both eyes as needed for Dry Eyes Yes  Taking  rosuvastatin  (CRESTOR ) 10 MG tablet TAKE 1 TABLET(10 MG) BY MOUTH AT BEDTIME Yes Taking  temazepam  (RESTORIL ) 15 mg capsule TAKE 1 CAPSULE(15 MG) BY MOUTH AT BEDTIME AS NEEDED FOR SLEEP Yes Taking  TURMERIC ORAL Take by mouth Yes Taking  vitamin B complex (B COMPLEX 1 ORAL) Take by mouth Yes Taking  benzonatate  (TESSALON ) 200 MG capsule Take 1 capsule (200 mg total) by mouth 3 (three) times daily as needed for Cough    doxycycline (VIBRAMYCIN) 100 MG capsule Take 1 capsule (100 mg total) by mouth 2 (two) times daily for 7 days    fluticasone propionate (FLONASE) 50 mcg/actuation nasal spray Place 1 spray into both nostrils 2 (two) times daily Patient not taking: Reported on 03/06/2024  Not Taking  predniSONE  (DELTASONE ) 10 MG tablet 3,3,3,2,2,2,1,1,1      Family History:   Family History  Problem Relation Name Age of Onset  . Dementia Father    . Myocardial Infarction (Heart attack) Brother Nadara Cain   . High blood pressure (Hypertension) Brother Nadara Cain   . Coronary Artery Disease (Blocked arteries around heart) Brother Nadara Cain   . High blood pressure (Hypertension) Son Deaunna Olarte   . Obesity Son Yalena Colon   . Osteoarthritis Daughter Marval Stager   . Skin cancer Daughter [facility]  . Thyroid  disease Daughter Marval Stager        left side removed on no medication    Social History:   Social History   Socioeconomic History  . Marital status: Widowed  Tobacco Use  . Smoking status: Former    Current packs/day: 0.00    Types: Cigarettes    Quit date: 07/30/1998    Years since quitting: 25.6  . Smokeless tobacco: Never  . Tobacco comments:    intermitant smoker - I used to smoke on Thursdays when I bowled.  Then smoked infrequently but quit  Vaping Use  . Vaping status: Never Used  Substance and Sexual Activity  . Alcohol use: Yes    Alcohol/week: 1.0 Wise drink of alcohol    Types: 1 Glasses of wine per week  . Drug use:  Never  . Sexual activity: Not Currently    Partners: Male    Comment: widowed and not sexually active   Social Drivers of Health   Financial Resource Strain: Low Risk  (10/21/2023)   Overall Financial Resource Strain (CARDIA)   . Difficulty of Paying Living Expenses: Not hard at all  Food Insecurity: No Food Insecurity (10/21/2023)   Hunger Vital Sign   . Worried About Programme researcher, broadcasting/film/video in the Last Year: Never true   . Ran Out of Food in the Last Year: Never  true  Transportation Needs: No Transportation Needs (10/21/2023)   PRAPARE - Transportation   . Lack of Transportation (Medical): No   . Lack of Transportation (Non-Medical): No  Physical Activity: Sufficiently Active (04/20/2020)   Received from [facility]   Exercise Vital Sign   . On average, how many days per week do you engage in moderate to strenuous exercise (like a brisk walk)?: 4 days   . On average, how many minutes do you engage in exercise at this level?: 60 min  Stress: No Stress Concern Present (04/20/2020)   Received from [facility] - Occupational Stress Questionnaire   . Feeling of Stress : Not at all  Social Connections: Moderately Isolated (10/10/2023)   Received from [facility]   Social Connection and Isolation Panel   . In a typical week, how many times do you talk on the phone with family, friends, or neighbors?: More than three times a week   . How often do you get together with friends or relatives?: Once a week   . How often do you attend church or religious services?: 1 to 4 times per year   . Do you belong to any clubs or organizations such as church groups, unions, fraternal or athletic groups, or school groups?: No   . How often do you attend meetings of the clubs or organizations you belong to?: Never   . Are you married, widowed, divorced, separated, never married, or living with a partner?: Widowed  Housing Stability: Low Risk  (10/21/2023)   Housing  Stability Vital Sign   . Unable to Pay for Housing in the Last Year: No   . Number of Times Moved in the Last Year: 0   . Homeless in the Last Year: No    Review of Systems:   A 10 point review of systems is negative, except for the pertinent positives and negatives detailed in the HPI.  Vitals:   Vitals:   03/06/24 1022  BP: 132/70  Pulse: 65  SpO2: 96%  Weight: 64.4 kg (142 lb)  Height: 165.1 cm (5' 5)     Body mass index is 23.63 kg/m.  Physical Exam:   Physical Exam Vitals and nursing note reviewed.  Constitutional:      General: She is not in acute distress.    Appearance: Normal appearance. She is not ill-appearing, toxic-appearing or diaphoretic.  HENT:     Head: Normocephalic and atraumatic.     Right Ear: External ear normal.     Left Ear: External ear normal. There is impacted cerumen.     Nose: Congestion and rhinorrhea present.     Mouth/Throat:     Mouth: Mucous membranes are moist.     Pharynx: Oropharynx is clear. Posterior oropharyngeal erythema present. No oropharyngeal exudate.  Eyes:     General:        Right eye: No discharge.        Left eye: No discharge.     Conjunctiva/sclera: Conjunctivae normal.  Neck:     Vascular: No carotid bruit.  Cardiovascular:     Rate and Rhythm: Normal rate and regular rhythm.     Pulses: Normal pulses.     Heart sounds: Normal heart sounds. No murmur heard.    No friction rub. No gallop.  Pulmonary:     Effort: Pulmonary effort is normal. No respiratory distress.     Breath sounds: Normal breath sounds. No stridor. No wheezing, rhonchi  or rales.  Chest:     Chest wall: No tenderness.  Musculoskeletal:     Cervical back: Normal range of motion and neck supple. No rigidity or tenderness.  Lymphadenopathy:     Cervical: No cervical adenopathy.  Skin:    General: Skin is warm and dry.     Capillary Refill: Capillary refill takes less than 2 seconds.  Neurological:     Mental Status: She is alert.      Assessment and Plan:  No results found for this visit on 03/06/24.  Diagnoses and all orders for this visit:  Bronchitis  Productive cough -     Extended Respiratory Viral Panel [facility] Glenn; Future  Chest tightness -     Extended Respiratory Viral Panel - Glenn; Future  SOB (shortness of breath)  Nasal congestion  Other orders -     predniSONE  (DELTASONE ) 10 MG tablet; 3,3,3,2,2,2,1,1,1 -     benzonatate  (TESSALON ) 200 MG capsule; Take 1 capsule (200 mg total) by mouth 3 (three) times daily as needed for Cough -     albuterol  MDI, PROVENTIL , VENTOLIN , PROAIR , HFA 90 mcg/actuation inhaler; Inhale 2 inhalations into the lungs 3 (three) times daily for 10 days -     doxycycline (VIBRAMYCIN) 100 MG capsule; Take 1 capsule (100 mg total) by mouth 2 (two) times daily for 7 days    Assessment & Plan Acute bronchitis with wheezing and possible early pneumonia Presents with symptoms suggestive of acute bronchitis with wheezing and possible early pneumonia. Reports a cough productive of brown sputum, shortness of breath, and wheezing, particularly on the left side. Concern for pneumonia given rapid progression to pneumonia in the past. Wheezing on auscultation indicates possible fluid or early pneumonia. - Prescribe prednisone  to reduce wheezing. - Prescribe doxycycline capsules, which can be taken with applesauce to aid swallowing. - Send a new albuterol  inhaler prescription to the pharmacy to help with wheezing and shortness of breath. - Prescribe Tessalon  Perles for cough management. - Perform a swab test for COVID-19, flu, and RSV to rule out these infections.   Follow up She will call the office with any new or worsening symptoms.  This note has been created using automated tools and reviewed for accuracy by provider.  Patient received an After Visit Summary    Attestation Statement:   I personally performed the service, non-incident to. (WP)   GLENDA MACARIO HADDOCK,  NP

## 2024-03-09 ENCOUNTER — Ambulatory Visit: Payer: Self-pay | Admitting: Internal Medicine

## 2024-03-09 ENCOUNTER — Encounter: Payer: Self-pay | Admitting: Internal Medicine

## 2024-03-09 ENCOUNTER — Telehealth: Payer: Self-pay | Admitting: Internal Medicine

## 2024-03-09 VITALS — BP 153/86 | HR 65 | Temp 98.0°F | Resp 16 | Ht 65.0 in | Wt 141.0 lb

## 2024-03-09 DIAGNOSIS — I421 Obstructive hypertrophic cardiomyopathy: Secondary | ICD-10-CM

## 2024-03-09 DIAGNOSIS — J189 Pneumonia, unspecified organism: Secondary | ICD-10-CM

## 2024-03-09 DIAGNOSIS — R0602 Shortness of breath: Secondary | ICD-10-CM

## 2024-03-09 DIAGNOSIS — R011 Cardiac murmur, unspecified: Secondary | ICD-10-CM | POA: Diagnosis not present

## 2024-03-09 NOTE — Progress Notes (Signed)
 [facility] [address] [place], [place] [address]  Pulmonary Sleep Medicine   Office Visit Note  Patient Name: Kathryn Wise DOB: November 19, 1929 MRN [id number]  Date of Service: 03/09/2024  Complaints/HPI: States she was in the hospital in Feb of 2025 with a GI bleed. Patient apparently also took a fall. Patient also had a pneumonia. She states she has not gotten better since the admission. Patient states she has been having weakness and also has been short of breath. She states she has no diagnosis of COPD. Patient states she has never smoked in her life. 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 CT chest done in March shows GGO pneumonia present no followup done. Patient was started on abx last week and notes no differnece. Still has cough and has sputum which is now clear. Patient feels a little better as far as the sputum is concerned. She has history of HOCM also  Office Spirometry Results:     ROS  General: (-) fever, (-) chills, (-) night sweats, (-) weakness Skin: (-) rashes, (-) itching,. Eyes: (-) visual changes, (-) redness, (-) itching. Nose and Sinuses: (-) nasal stuffiness or itchiness, (-) postnasal drip, (-) nosebleeds, (-) sinus trouble. Mouth and Throat: (-) sore throat, (-) hoarseness. Neck: (-) swollen glands, (-) enlarged thyroid , (-) neck pain. Respiratory: + cough, (-) bloody sputum, + shortness of breath, + wheezing. Cardiovascular: - ankle swelling, (-) chest pain. Lymphatic: (-) lymph node enlargement. Neurologic: (-) numbness, (-) tingling. Psychiatric: (-) anxiety, (-) depression   Current Medication: Outpatient Encounter Medications as of 03/09/2024  Medication Sig Note   albuterol  (VENTOLIN  HFA) 108 (90 Base) MCG/ACT inhaler Inhale 2 puffs into the lungs every 6 (six) hours as needed for wheezing or shortness of breath.    amLODipine  (NORVASC ) 5 MG tablet Take 1 tablet (5 mg total) by mouth as needed (Once daily AS NEEDED for SBP  (top blood pressure number) is greater than 160.). 09/26/2023: prn   aspirin  EC 81 MG tablet Take 1 tablet (81 mg total) by mouth daily. Swallow whole.    bisacodyl  (DULCOLAX) 5 MG EC tablet Take 1 tablet (5 mg total) by mouth daily as needed for moderate constipation. 10/08/2023: prn   bisoprolol  (ZEBETA ) 5 MG tablet Take 1 tablet (5 mg total) by mouth every other day.    Cholecalciferol  (VITAMIN D -3) 1000 units CAPS Take 1,000 Units by mouth daily.    cyanocobalamin  (VITAMIN B12) 1000 MCG tablet Take 1,000 mcg by mouth daily.    doxycycline (ADOXA) 100 MG tablet Take 100 mg by mouth 2 (two) times daily.    lactulose  (CHRONULAC ) 10 GM/15ML solution Take 45 mLs (30 g total) by mouth 2 (two) times daily as needed for mild constipation. 10/08/2023: prn   levothyroxine  (SYNTHROID ) 75 MCG tablet Take 75 mcg by mouth daily.    lidocaine  (LIDODERM ) 5 % Place 1-3 patches onto the skin daily. Remove & Discard patch within 12 hours or as directed by MD    mupirocin  ointment (BACTROBAN ) 2 % Apply 1 Application topically daily.    nitroGLYCERIN  (NITROSTAT ) 0.4 MG SL tablet Place 1 tablet (0.4 mg total) under the tongue every 5 (five) minutes as needed for chest pain. 09/26/2023: prn   omeprazole  (PRILOSEC) 20 MG capsule TAKE 1 CAPSULE(20 MG) BY MOUTH EVERY MORNING AS NEEDED FOR HEARTBURN    predniSONE  (DELTASONE ) 10 MG tablet Take 10 mg by mouth daily with breakfast.    rosuvastatin  (CRESTOR ) 10 MG  tablet Take 1 tablet (10 mg total) by mouth at bedtime.    temazepam  (RESTORIL ) 15 MG capsule Take 1 capsule (15 mg total) by mouth at bedtime as needed for sleep.    Turmeric 500 MG CAPS Take 500 mg by mouth daily.    No facility-administered encounter medications on file as of 03/09/2024.    Surgical History: Past Surgical History:  Procedure Laterality Date   ARTHOSCOPIC ROTAOR CUFF REPAIR     LEFT  10+  YEARS     CAROTID PTA/STENT INTERVENTION Left 10/19/2020   Procedure: CAROTID PTA/STENT INTERVENTION;   Surgeon: Jama Cordella MATSU, MD;  Location: [facility] INVASIVE CV LAB;  Service: Cardiovascular;  Laterality: Left;   CATARACT EXTRACTION W/ INTRAOCULAR LENS IMPLANT     RIGHT EYE   CATARACT EXTRACTION W/PHACO Left 03/28/2016   Procedure: CATARACT EXTRACTION PHACO AND INTRAOCULAR LENS PLACEMENT (IOC);  Surgeon: Dene Etienne, MD;  Location: [facility];  Service: Ophthalmology;  Laterality: Left;  TORIC   FACELIFT     FOOT SURGERY     rt foot   TUMOR REMOVED   JOINT REPLACEMENT Left 06/2012   shoulder   LEFT HEART CATH AND CORONARY ANGIOGRAPHY N/A 06/12/2017   Procedure: LEFT HEART CATH AND CORONARY ANGIOGRAPHY;  Surgeon: Perla Evalene PARAS, MD;  Location: [facility] INVASIVE CV LAB;  Service: Cardiovascular;  Laterality: N/A;   RENAL ANGIOGRAPHY Left 01/07/2019   Procedure: RENAL ANGIOGRAPHY;  Surgeon: Jama Cordella MATSU, MD;  Location: [facility] INVASIVE CV LAB;  Service: Cardiovascular;  Laterality: Left;   RENAL ARTERY STENT     SKIN CANCER EXCISION     TOTAL SHOULDER ARTHROPLASTY  07/14/2012   Procedure: TOTAL SHOULDER ARTHROPLASTY;  Surgeon: Fonda SHAUNNA Olmsted, MD;  Location: [facility] OR;  Service: Orthopedics;  Laterality: Left;    Medical History: Past Medical History:  Diagnosis Date   Arthritis    Collagen vascular disease (HCC)    Dysphagia, pharyngoesophageal phase 09/10/2013   Upper GI study with barium swallow was done at  [facility] Mar 2015   No reflux seen  Small irreducible hiatal hernia  Mild changes of presbyeophagus (abnormal contractions of the esophagus that occur with aging) No strictures Normal gastric emptying  Incomplete visualization of stomach fold due to patient's inability turn     GERD (gastroesophageal reflux disease)    Heart murmur    has had years and years   History of shingles 06/2012   treated with steroids , post op from shoulder surgery   History of squamous cell carcinoma 05/02/2016   right mid lateral pretibial   Hypertension    Hypothyroidism    Major  depressive disorder, single episode 11/05/2015   Osteoarthritis of left shoulder 07/14/2012   Pneumonia 10/21/2020   Squamous cell carcinoma of skin 05/12/2016   R mid lat pretibial - other skin cancers treated by Dr. Jakie   Squamous cell carcinoma of skin 08/22/2020   left distal medial popliteal - [facility]   Squamous cell carcinoma of skin 11/25/2023   SCC IS, right distal lateral pretibial above the ankle, [facility]    Family History: Family History  Problem Relation Age of Onset   Cancer Daughter        Breast   Breast cancer Daughter 20   Cancer Son     Social History: Social History   Socioeconomic History   Marital status: Widowed    Spouse name: Not on file   Number of children: Not on file   Years of education:  Not on file   Highest education level: Not on file  Occupational History   Not on file  Tobacco Use   Smoking status: Former    Current packs/day: 0.00    Types: Cigarettes    Quit date: 07/30/1972    Years since quitting: 51.6   Smokeless tobacco: Never   Tobacco comments:    smoked for only a few months  Vaping Use   Vaping status: Never Used  Substance and Sexual Activity   Alcohol use: Yes    Comment: OCCAS WINE    Drug use: No   Sexual activity: Never  Other Topics Concern   Not on file  Social History Narrative   Not on file   Social Drivers of Health   Financial Resource Strain: Low Risk  (10/21/2023)   Received from [facility]   Overall Financial Resource Strain (CARDIA)    Difficulty of Paying Living Expenses: Not hard at all  Food Insecurity: No Food Insecurity (10/21/2023)   Received from [facility]   Hunger Vital Sign    Within the past 12 months, you worried that your food would run out before you got the money to buy more.: Never true    Within the past 12 months, the food you bought just didn't last and you didn't have money to get more.: Never true  Transportation Needs: No Transportation Needs  (10/21/2023)   Received from [facility] - Transportation    In the past 12 months, has lack of transportation kept you from medical appointments or from getting medications?: No    Lack of Transportation (Non-Medical): No  Physical Activity: Sufficiently Active (04/20/2020)   Exercise Vital Sign    Days of Exercise per Week: 4 days    Minutes of Exercise per Session: 60 min  Stress: No Stress Concern Present (04/20/2020)   [employer] of Occupational Health - Occupational Stress Questionnaire    Feeling of Stress : Not at all  Social Connections: Moderately Isolated (10/10/2023)   Social Connection and Isolation Panel    Frequency of Communication with Friends and Family: More than three times a week    Frequency of Social Gatherings with Friends and Family: Once a week    Attends Religious Services: 1 to 4 times per year    Active Member of [employer] or Organizations: No    Attends Banker Meetings: Never    Marital Status: Widowed  Intimate Partner Violence: Not At Risk (10/10/2023)   Humiliation, Afraid, Rape, and Kick questionnaire    Fear of Current or Ex-Partner: No    Emotionally Abused: No    Physically Abused: No    Sexually Abused: No    Vital Signs: Blood pressure (!) 153/86, pulse 65, temperature 98 F (36.7 C), resp. rate 16, height 5' 5 (1.651 m), weight 141 lb (64 kg), SpO2 98%.  Examination: General Appearance: The patient is well-developed, well-nourished, and in no distress. Skin: Gross inspection of skin unremarkable. Head: normocephalic, no gross deformities. Eyes: no gross deformities noted. ENT: ears appear grossly normal no exudates. Neck: Supple. No thyromegaly. No LAD. Respiratory: no rhonchi noted. Cardiovascular: Normal S1 and S2 without murmur or rub. Extremities: No cyanosis. pulses are equal. Neurologic: Alert and oriented. No involuntary movements.  LABS: Recent Results (from the past 2160 hours)   Basic metabolic panel with GFR     Status: Abnormal   Collection Time: 01/20/24  2:02 PM  Result  Value Ref Range   Glucose 131 (H) 70 - 99 mg/dL   BUN 19 10 - 36 mg/dL   Creatinine, Ser 8.89 (H) 0.57 - 1.00 mg/dL   eGFR 47 (L) >40 fO/fpw/8.26   BUN/Creatinine Ratio 17 12 - 28   Sodium 133 (L) 134 - 144 mmol/L   Potassium 5.5 (H) 3.5 - 5.2 mmol/L   Chloride 98 96 - 106 mmol/L   CO2 23 20 - 29 mmol/L   Calcium  9.5 8.7 - 10.3 mg/dL  CBC     Status: Abnormal   Collection Time: 01/20/24  2:02 PM  Result Value Ref Range   WBC 6.7 3.4 - 10.8 x10E3/uL   RBC 4.26 3.77 - 5.28 x10E6/uL   Hemoglobin 11.1 11.1 - 15.9 g/dL   Hematocrit 64.8 65.9 - 46.6 %   MCV 82 79 - 97 fL   MCH 26.1 (L) 26.6 - 33.0 pg   MCHC 31.6 31.5 - 35.7 g/dL   RDW 84.9 88.2 - 84.5 %   Platelets 167 150 - 450 x10E3/uL    Radiology: CT Angio Abd/Pel w/ and/or w/o Result Date: 12/26/2023 CLINICAL DATA:  Abdominal pain EXAM: CTA ABDOMEN AND PELVIS WITHOUT AND WITH CONTRAST TECHNIQUE: Multidetector CT imaging of the abdomen and pelvis was performed using the standard protocol during bolus administration of intravenous contrast. Multiplanar reconstructed images and MIPs were obtained and reviewed to evaluate the vascular anatomy. RADIATION DOSE REDUCTION: This exam was performed according to the departmental dose-optimization program which includes automated exposure control, adjustment of the mA and/or kV according to patient size and/or use of iterative reconstruction technique. CONTRAST:  80mL OMNIPAQUE  IOHEXOL  350 MG/ML SOLN COMPARISON:  CT of the abdomen and pelvis performed September 25, 2023 FINDINGS: VASCULAR Aorta: Patent, mild diffuse atherosclerotic changes. Celiac: Patent. The common hepatic artery originates directly from the aorta. SMA: Mild atherosclerotic changes in the proximal segment, patent. Renals: Patent right renal artery stent. Mild atherosclerotic changes are present in the left main renal artery. IMA:  Patent. Inflow: Patent. Proximal Outflow: Patent. Veins: No obvious venous abnormality within the limitations of this arterial phase study. Review of the MIP images confirms the above findings. NON-VASCULAR Lower chest: Enlarged heart.  Hiatal hernia. Hepatobiliary: No focal liver abnormality is seen. No gallstones, gallbladder wall thickening, or biliary dilatation. Pancreas: Cystic mass in the head of the pancreas is estimated at 1.2 cm in size, similar when compared to CT performed September 27, 2023. Spleen: Normal in size without focal abnormality. Adrenals/Urinary Tract: Adrenal glands are unremarkable. Kidneys are normal, without renal calculi, focal lesion, or hydronephrosis. Bladder is unremarkable. Stomach/Bowel: Hiatal hernia. No dilated loops of small bowel are appreciated. Scattered colonic diverticulosis. Lymphatic: No lymphadenopathy. Reproductive: Uterus and bilateral adnexa are unremarkable. Other: The previously observed rectus hematoma has nearly completely resolved with minimal thickness of the inferior left rectus abdominus. Musculoskeletal: Progression of previously observed compression deformity at T12 which demonstrates increased height loss, particularly in the anterior segment. Very mild retropulsion is present. Underlying increased sclerosis is also noted. IMPRESSION: 1. No evidence of active arterial extravasation. Atherosclerotic changes are detailed above. 2. Patent right renal artery stent. 3. Near complete resolution of previously observed hematoma within the left rectus abdominus. 4. Stable appearance of suspected cystic neoplasm within the pancreas which does not require additional imaging follow-up based on patient's age in the appropriate clinical setting. 5. Changes in the T12 vertebral body secondary to chronic compression fracture. Electronically Signed   By: Maude Ray HERO.D.  On: 12/26/2023 10:53    No results found.  No results found.  Assessment and Plan: Patient  Active Problem List   Diagnosis Date Noted   Influenza 10/08/2023   Acute bronchitis due to Haemophilus influenzae A 10/08/2023   Acute bronchitis 10/08/2023   Normocytic anemia 10/08/2023   Myocardial injury 10/08/2023   Chronic heart failure with preserved ejection fraction (HFpEF) (HCC) 10/08/2023   Positive D dimer 10/08/2023   T12 burst fracture (HCC) 09/25/2023   Orthostatic hypotension 09/23/2023   Rectus sheath hematoma 09/22/2023   Symptomatic anemia 09/22/2023   Obstructive hypertrophic cardiomyopathy (HCC) 03/02/2023   UTI (urinary tract infection) 03/01/2023   NSTEMI (non-ST elevated myocardial infarction) (HCC) 03/01/2023   Acute heart failure with preserved ejection fraction (HFpEF) (HCC) 03/01/2023   Leukocytosis 03/01/2023   Rigors 03/01/2023   Demand ischemia (HCC) 03/01/2023   Paroxysmal atrial flutter (HCC) 08/18/2022   Near syncope 08/18/2022   Paroxysmal tachycardia (HCC) 08/18/2022   HOCM (hypertrophic obstructive cardiomyopathy) (HCC) 08/18/2022   Labile hypertension 08/18/2022   Chest pain 08/18/2022   Paroxysmal atrial fibrillation with RVR (HCC) 08/17/2022   Subdural hematoma (HCC) 11/14/2021   Iron  deficiency anemia, unspecified 10/12/2021   Cerumen impaction 10/10/2021   Prediabetes 10/10/2021   Polyarthralgia 09/26/2021   Decreased glomerular filtration rate (GFR) 04/01/2021   Aortic atherosclerosis (HCC) 03/28/2021   Acute hypoxic respiratory failure (HCC) 10/21/2020   Hyponatremia 10/21/2020   Elevated troponin 10/21/2020   Carotid stenosis, symptomatic w/o infarct, bilateral 10/19/2020   Carotid stenosis 09/12/2020   Urinary incontinence, urge 05/05/2020   Arteriosclerotic cerebrovascular disease 05/05/2020   Recurrent occipital headache 03/07/2019   Sensorineural hearing loss (SNHL) of left ear 01/23/2019   Renal artery stenosis (HCC) 01/01/2019   Lymphedema 01/01/2019   PAD (peripheral artery disease) (HCC) 01/01/2019   History of TIA  (transient ischemic attack) 11/13/2018   Tinnitus aurium, bilateral 10/19/2018   Blurred vision, bilateral 10/19/2018   Insomnia due to psychological stress 10/07/2018   Food intolerance in adult 10/07/2018   Myalgia due to statin 12/10/2017   Hyperlipidemia LDL goal <70 10/03/2017   Hiatal hernia 07/03/2017   CAD (coronary atherosclerotic disease) 07/03/2017   Hospital discharge follow-up 07/03/2017   Chest pain due to CAD (HCC) 06/10/2017   Pain in left wrist 09/20/2016   Breast cancer screening by mammogram 09/20/2016   Precordial pain 05/31/2016   Hypothyroidism 04/28/2015   Renovascular hypertension 01/13/2014   Essential hypertension 01/13/2014   Osteopenia 10/25/2013   Disorder of bone and cartilage 10/25/2013   GERD (gastroesophageal reflux disease) 09/11/2013   Encounter for preventive health examination 09/11/2013   Heart murmur 11/28/2012   History of shingles    Peripheral edema 11/08/2012   Osteoarthritis of left shoulder 07/14/2012    1. Shortness of breath (Primary) - Pulmonary function test; Future - CT Chest Wo Contrast; Future  2. Pneumonia of both lungs due to infectious organism, unspecified part of lung Patient has been having prolonged protracted course breathing difficulties after having pneumonia in the hospital.  She was found to have GGO's on her x-rays we will get CT scan of the chest to see if there has been any change or improvement. - CT Chest Wo Contrast; Future  3. Heart murmur She does have a heart murmur would recommend follow-up with primary or cardiology for her history of hypertrophic cardiomyopathy  4. HOCM (hypertrophic obstructive cardiomyopathy) (HCC) May very well also be contributing to her shortness of breath we will reassess after the CT  scan of the chest is done  General Counseling: I have discussed the findings of the evaluation and examination with [facility].  I have also discussed any further diagnostic evaluation thatmay be  needed or ordered today. Aryani verbalizes understanding of the findings of todays visit. We also reviewed her medications today and discussed drug interactions and side effects including but not limited excessive drowsiness and altered mental states. We also discussed that there is always a risk not just to her but also people around her. she has been encouraged to call the office with any questions or concerns that should arise related to todays visit.  No orders of the defined types were placed in this encounter.    Time spent: 84  I have personally obtained a history, examined the patient, evaluated laboratory and imaging results, formulated the assessment and plan and placed orders.    Kathryn DELENA Bathe, MD [facility] Pulmonary and Critical Care Sleep medicine

## 2024-03-09 NOTE — Telephone Encounter (Signed)
 Notified patient of CT appointment date, arrival time, location-Toni

## 2024-03-09 NOTE — Patient Instructions (Signed)

## 2024-03-11 ENCOUNTER — Telehealth: Payer: Self-pay | Admitting: Internal Medicine

## 2024-03-11 NOTE — Telephone Encounter (Signed)
 Left vm and sent mychart message to confirm 03/18/24 appointment-Toni

## 2024-03-16 ENCOUNTER — Ambulatory Visit
Admission: RE | Admit: 2024-03-16 | Discharge: 2024-03-16 | Disposition: A | Discharge status: Home / Self Care | Source: Ambulatory Visit | Attending: Internal Medicine | Admitting: Internal Medicine

## 2024-03-16 DIAGNOSIS — J189 Pneumonia, unspecified organism: Secondary | ICD-10-CM | POA: Insufficient documentation

## 2024-03-16 DIAGNOSIS — R0602 Shortness of breath: Secondary | ICD-10-CM | POA: Insufficient documentation

## 2024-03-16 DIAGNOSIS — R918 Other nonspecific abnormal finding of lung field: Secondary | ICD-10-CM | POA: Diagnosis not present

## 2024-03-18 ENCOUNTER — Ambulatory Visit (INDEPENDENT_AMBULATORY_CARE_PROVIDER_SITE_OTHER): Admitting: Internal Medicine

## 2024-03-18 DIAGNOSIS — R0602 Shortness of breath: Secondary | ICD-10-CM | POA: Diagnosis not present

## 2024-04-05 NOTE — Procedures (Signed)
 [facility] [address] [place], [address]    Complete Pulmonary Function Testing Interpretation:  FINDINGS:  The forced vital capacity is mildly decreased.  FEV1 is 1.57 L which is 91% of predicted within normal limits.  Postbronchodilator there is no significant change in the FEV1.  Total lung capacity is mildly decreased.  Residual volume is decreased FRC is decreased DLCO was mildly decreased.  IMPRESSION:  This pulmonary function study is consistent with a mild restrictive lung disease clinical correlation is recommended.  Kathryn DELENA Bathe, MD [facility] Pulmonary Critical Care Medicine Sleep Medicine

## 2024-04-06 ENCOUNTER — Ambulatory Visit (INDEPENDENT_AMBULATORY_CARE_PROVIDER_SITE_OTHER): Admitting: Internal Medicine

## 2024-04-06 ENCOUNTER — Encounter: Payer: Self-pay | Admitting: Internal Medicine

## 2024-04-06 VITALS — BP 140/80 | HR 62 | Temp 97.8°F | Resp 16 | Ht 65.0 in | Wt 141.0 lb

## 2024-04-06 DIAGNOSIS — I421 Obstructive hypertrophic cardiomyopathy: Secondary | ICD-10-CM

## 2024-04-06 DIAGNOSIS — J189 Pneumonia, unspecified organism: Secondary | ICD-10-CM | POA: Diagnosis not present

## 2024-04-06 DIAGNOSIS — R0602 Shortness of breath: Secondary | ICD-10-CM | POA: Diagnosis not present

## 2024-04-06 NOTE — Patient Instructions (Signed)
 Shortness of Breath, Adult Shortness of breath means you have trouble breathing. Shortness of breath could be a sign of a medical problem. Follow these instructions at home:  Pollution Do not smoke or use any products that contain nicotine or tobacco. If you need help quitting, ask your doctor. Avoid things that can make it harder to breathe, such as: Smoke of all kinds. This includes smoke from campfires or forest fires. Do not smoke or allow others to smoke in your home. Mold. Dust. Air pollution. Chemical smells. Things that can give you an allergic reaction (allergens) if you have allergies. Keep your living space clean. Use products that help remove mold and dust. General instructions Watch for any changes in your symptoms. Take over-the-counter and prescription medicines only as told by your doctor. This includes oxygen therapy and inhaled medicines. Rest as needed. Return to your normal activities when your doctor says that it is safe. Keep all follow-up visits. Contact a doctor if: Your condition does not get better as soon as expected. You have a hard time doing your normal activities, even after you rest. You have new symptoms. You cannot walk up stairs. You cannot exercise the way you normally do. Get help right away if: Your shortness of breath gets worse. You have trouble breathing when you are resting. You feel light-headed or you faint. You have a cough that is not helped by medicines. You cough up blood. You have pain with breathing. You have pain in your chest, arms, shoulders, or belly (abdomen). You have a fever. These symptoms may be an emergency. Get help right away. Call 911. Do not wait to see if the symptoms will go away. Do not drive yourself to the hospital. Summary Shortness of breath is when you have trouble breathing enough air. It can be a sign of a medical problem. Avoid things that make it hard for you to breathe, such as smoking, pollution,  mold, and dust. Watch for any changes in your symptoms. Contact your doctor if you do not get better or you get worse. This information is not intended to replace advice given to you by your health care provider. Make sure you discuss any questions you have with your health care provider. Document Revised: 03/04/2021 Document Reviewed: 03/04/2021 Elsevier Patient Education  2024 [employer].

## 2024-04-06 NOTE — Progress Notes (Signed)
 [facility] [address] [place], [place] [address]  Pulmonary Sleep Medicine   Office Visit Note  Patient Name: Kathryn Wise DOB: February 04, 1930 MRN [id number]  Date of Service: 04/06/2024  Complaints/HPI: She had a Ct scan done and this shows resolutio of the pneumonia. There was further loss of height of T12. Patient was seen by surgeon and is not a candidate for any intervention she states due to age. Orvil been doing well with her breathing overall. No chest pain noted no cough. She states she has shortness of breath when she exerts. She has been taking albuterol  but nothing else. Her last PFT was within normal. She feels she is deconditioned. She is doing water aerobics along with other exercises  Office Spirometry Results:     ROS  General: (-) fever, (-) chills, (-) night sweats, (-) weakness Skin: (-) rashes, (-) itching,. Eyes: (-) visual changes, (-) redness, (-) itching. Nose and Sinuses: (-) nasal stuffiness or itchiness, (-) postnasal drip, (-) nosebleeds, (-) sinus trouble. Mouth and Throat: (-) sore throat, (-) hoarseness. Neck: (-) swollen glands, (-) enlarged thyroid , (-) neck pain. Respiratory: - cough, (-) bloody sputum, + shortness of breath, - wheezing. Cardiovascular: - ankle swelling, (-) chest pain. Lymphatic: (-) lymph node enlargement. Neurologic: (-) numbness, (-) tingling. Psychiatric: (-) anxiety, (-) depression   Current Medication: Outpatient Encounter Medications as of 04/06/2024  Medication Sig Note   albuterol  (VENTOLIN  HFA) 108 (90 Base) MCG/ACT inhaler Inhale 2 puffs into the lungs every 6 (six) hours as needed for wheezing or shortness of breath.    amLODipine  (NORVASC ) 5 MG tablet Take 1 tablet (5 mg total) by mouth as needed (Once daily AS NEEDED for SBP (top blood pressure number) is greater than 160.). 09/26/2023: prn   aspirin  EC 81 MG tablet Take 1 tablet (81 mg total) by mouth daily. Swallow whole.    bisacodyl  (DULCOLAX) 5  MG EC tablet Take 1 tablet (5 mg total) by mouth daily as needed for moderate constipation. 10/08/2023: prn   bisoprolol  (ZEBETA ) 5 MG tablet Take 1 tablet (5 mg total) by mouth every other day.    Cholecalciferol  (VITAMIN D -3) 1000 units CAPS Take 1,000 Units by mouth daily.    cyanocobalamin  (VITAMIN B12) 1000 MCG tablet Take 1,000 mcg by mouth daily.    doxycycline (ADOXA) 100 MG tablet Take 100 mg by mouth 2 (two) times daily.    lactulose  (CHRONULAC ) 10 GM/15ML solution Take 45 mLs (30 g total) by mouth 2 (two) times daily as needed for mild constipation. 10/08/2023: prn   levothyroxine  (SYNTHROID ) 75 MCG tablet Take 75 mcg by mouth daily.    lidocaine  (LIDODERM ) 5 % Place 1-3 patches onto the skin daily. Remove & Discard patch within 12 hours or as directed by MD    mupirocin  ointment (BACTROBAN ) 2 % Apply 1 Application topically daily.    nitroGLYCERIN  (NITROSTAT ) 0.4 MG SL tablet Place 1 tablet (0.4 mg total) under the tongue every 5 (five) minutes as needed for chest pain. 09/26/2023: prn   omeprazole  (PRILOSEC) 20 MG capsule TAKE 1 CAPSULE(20 MG) BY MOUTH EVERY MORNING AS NEEDED FOR HEARTBURN    predniSONE  (DELTASONE ) 10 MG tablet Take 10 mg by mouth daily with breakfast.    rosuvastatin  (CRESTOR ) 10 MG tablet Take 1 tablet (10 mg total) by mouth at bedtime.    temazepam  (RESTORIL ) 15 MG capsule Take 1 capsule (15 mg total) by mouth at bedtime as needed for sleep.    Turmeric  500 MG CAPS Take 500 mg by mouth daily.    No facility-administered encounter medications on file as of 04/06/2024.    Surgical History: Past Surgical History:  Procedure Laterality Date   ARTHOSCOPIC ROTAOR CUFF REPAIR     LEFT  10+  YEARS     CAROTID PTA/STENT INTERVENTION Left 10/19/2020   Procedure: CAROTID PTA/STENT INTERVENTION;  Surgeon: Jama Cordella MATSU, MD;  Location: [facility] INVASIVE CV LAB;  Service: Cardiovascular;  Laterality: Left;   CATARACT EXTRACTION W/ INTRAOCULAR LENS IMPLANT     RIGHT EYE    CATARACT EXTRACTION W/PHACO Left 03/28/2016   Procedure: CATARACT EXTRACTION PHACO AND INTRAOCULAR LENS PLACEMENT (IOC);  Surgeon: Dene Etienne, MD;  Location: [facility];  Service: Ophthalmology;  Laterality: Left;  TORIC   FACELIFT     FOOT SURGERY     rt foot   TUMOR REMOVED   JOINT REPLACEMENT Left 06/2012   shoulder   LEFT HEART CATH AND CORONARY ANGIOGRAPHY N/A 06/12/2017   Procedure: LEFT HEART CATH AND CORONARY ANGIOGRAPHY;  Surgeon: Perla Evalene PARAS, MD;  Location: [facility] INVASIVE CV LAB;  Service: Cardiovascular;  Laterality: N/A;   RENAL ANGIOGRAPHY Left 01/07/2019   Procedure: RENAL ANGIOGRAPHY;  Surgeon: Jama Cordella MATSU, MD;  Location: [facility] INVASIVE CV LAB;  Service: Cardiovascular;  Laterality: Left;   RENAL ARTERY STENT     SKIN CANCER EXCISION     TOTAL SHOULDER ARTHROPLASTY  07/14/2012   Procedure: TOTAL SHOULDER ARTHROPLASTY;  Surgeon: Fonda SHAUNNA Olmsted, MD;  Location: [facility] OR;  Service: Orthopedics;  Laterality: Left;    Medical History: Past Medical History:  Diagnosis Date   Arthritis    Collagen vascular disease (HCC)    Dysphagia, pharyngoesophageal phase 09/10/2013   Upper GI study with barium swallow was done at  [facility] Mar 2015   No reflux seen  Small irreducible hiatal hernia  Mild changes of presbyeophagus (abnormal contractions of the esophagus that occur with aging) No strictures Normal gastric emptying  Incomplete visualization of stomach fold due to patient's inability turn     GERD (gastroesophageal reflux disease)    Heart murmur    has had years and years   History of shingles 06/2012   treated with steroids , post op from shoulder surgery   History of squamous cell carcinoma 05/02/2016   right mid lateral pretibial   Hypertension    Hypothyroidism    Major depressive disorder, single episode 11/05/2015   Osteoarthritis of left shoulder 07/14/2012   Pneumonia 10/21/2020   Squamous cell carcinoma of skin 05/12/2016   R mid lat  pretibial - other skin cancers treated by Dr. Jakie   Squamous cell carcinoma of skin 08/22/2020   left distal medial popliteal - [facility]   Squamous cell carcinoma of skin 11/25/2023   SCC IS, right distal lateral pretibial above the ankle, [facility]    Family History: Family History  Problem Relation Age of Onset   Cancer Daughter        Breast   Breast cancer Daughter 67   Cancer Son     Social History: Social History   Socioeconomic History   Marital status: Widowed    Spouse name: Not on file   Number of children: Not on file   Years of education: Not on file   Highest education level: Not on file  Occupational History   Not on file  Tobacco Use   Smoking status: Former    Current packs/day: 0.00    Types:  Cigarettes    Quit date: 07/30/1972    Years since quitting: 51.7   Smokeless tobacco: Never   Tobacco comments:    smoked for only a few months  Vaping Use   Vaping status: Never Used  Substance and Sexual Activity   Alcohol use: Yes    Comment: OCCAS WINE    Drug use: No   Sexual activity: Never  Other Topics Concern   Not on file  Social History Narrative   Not on file   Social Drivers of Health   Financial Resource Strain: Low Risk  (10/21/2023)   Received from [facility]   Overall Financial Resource Strain (CARDIA)    Difficulty of Paying Living Expenses: Not hard at all  Food Insecurity: No Food Insecurity (10/21/2023)   Received from [facility]   Hunger Vital Sign    Within the past 12 months, you worried that your food would run out before you got the money to buy more.: Never true    Within the past 12 months, the food you bought just didn't last and you didn't have money to get more.: Never true  Transportation Needs: No Transportation Needs (10/21/2023)   Received from [facility] - Transportation    In the past 12 months, has lack of transportation kept you from medical appointments  or from getting medications?: No    Lack of Transportation (Non-Medical): No  Physical Activity: Sufficiently Active (04/20/2020)   Exercise Vital Sign    Days of Exercise per Week: 4 days    Minutes of Exercise per Session: 60 min  Stress: No Stress Concern Present (04/20/2020)   [employer] of Occupational Health - Occupational Stress Questionnaire    Feeling of Stress : Not at all  Social Connections: Moderately Isolated (10/10/2023)   Social Connection and Isolation Panel    Frequency of Communication with Friends and Family: More than three times a week    Frequency of Social Gatherings with Friends and Family: Once a week    Attends Religious Services: 1 to 4 times per year    Active Member of [employer] or Organizations: No    Attends Banker Meetings: Never    Marital Status: Widowed  Intimate Partner Violence: Not At Risk (10/10/2023)   Humiliation, Afraid, Rape, and Kick questionnaire    Fear of Current or Ex-Partner: No    Emotionally Abused: No    Physically Abused: No    Sexually Abused: No    Vital Signs: Blood pressure (!) 140/80, pulse 62, temperature 97.8 F (36.6 C), resp. rate 16, height 5' 5 (1.651 m), weight 141 lb (64 kg), SpO2 97%.  Examination: General Appearance: The patient is well-developed, well-nourished, and in no distress. Skin: Gross inspection of skin unremarkable. Head: normocephalic, no gross deformities. Eyes: no gross deformities noted. ENT: ears appear grossly normal no exudates. Neck: Supple. No thyromegaly. No LAD. Respiratory: no rhonchi noted. Cardiovascular: Normal S1 and S2 without murmur or rub. Extremities: No cyanosis. pulses are equal. Neurologic: Alert and oriented. No involuntary movements.  LABS: Recent Results (from the past 2160 hours)  Basic metabolic panel with GFR     Status: Abnormal   Collection Time: 01/20/24  2:02 PM  Result Value Ref Range   Glucose 131 (H) 70 - 99 mg/dL   BUN 19 10 - 36 mg/dL    Creatinine, Ser 8.89 (H) 0.57 - 1.00 mg/dL   eGFR 47 (L) >40 fO/fpw/8.26  BUN/Creatinine Ratio 17 12 - 28   Sodium 133 (L) 134 - 144 mmol/L   Potassium 5.5 (H) 3.5 - 5.2 mmol/L   Chloride 98 96 - 106 mmol/L   CO2 23 20 - 29 mmol/L   Calcium  9.5 8.7 - 10.3 mg/dL  CBC     Status: Abnormal   Collection Time: 01/20/24  2:02 PM  Result Value Ref Range   WBC 6.7 3.4 - 10.8 x10E3/uL   RBC 4.26 3.77 - 5.28 x10E6/uL   Hemoglobin 11.1 11.1 - 15.9 g/dL   Hematocrit 64.8 65.9 - 46.6 %   MCV 82 79 - 97 fL   MCH 26.1 (L) 26.6 - 33.0 pg   MCHC 31.6 31.5 - 35.7 g/dL   RDW 84.9 88.2 - 84.5 %   Platelets 167 150 - 450 x10E3/uL    Radiology: CT Chest Wo Contrast Result Date: 03/28/2024 CLINICAL DATA:  Shortness of breath and weakness. Pneumonia earlier this year. EXAM: CT CHEST WITHOUT CONTRAST TECHNIQUE: Multidetector CT imaging of the chest was performed following the standard protocol without IV contrast. RADIATION DOSE REDUCTION: This exam was performed according to the departmental dose-optimization program which includes automated exposure control, adjustment of the mA and/or kV according to patient size and/or use of iterative reconstruction technique. COMPARISON:  CTA chest 10/08/2023 FINDINGS: Cardiovascular: Stable cardiac enlargement without pericardial effusion. Stable heavily calcified coronary artery plaque, especially in the distribution of the LAD. Heavily calcified mitral annulus. Stable atherosclerosis of the thoracic aorta without aneurysm. Stable dilated central pulmonary arteries with the main pulmonary artery measuring up to approximately 3.4 cm. Mediastinum/Nodes: Stable small nonenlarged mediastinal lymph nodes. Stable large hiatal hernia. Lungs/Pleura: The ground-glass pneumonia and interstitial tree in bud nodularity affecting both lungs on the prior study, left greater than right, has resolved. Bronchial thickening seen on the prior study has also resolved. Multiple tiny pulmonary  nodules are likely postinflammatory including 2 mm right upper lobe nodule on image 26 of lung windows, 1 mm posterior right upper lobe nodule on image 38, and some mild residual tree in bud nodularity in the right middle lobe. Small airway mucous plug in the posterior right upper lobe on image 44. Scarring in the posterior left lower lobe. Bilateral pleural effusions have resolved since the prior study. No pulmonary edema or pneumothorax. Upper Abdomen: No acute abnormality. Musculoskeletal: Further loss of height at the T12 level with further compression of the vertebral body. Maximal loss of height of the anterior and central vertebral body now approaches 70-80%. Small degree of retropulsion along the superior aspect of the posterior vertebral body remains present and stable compared to the prior CT. IMPRESSION: 1. Resolution of bilateral pneumonia and pleural effusions since the prior study. 2. Residual tiny pulmonary nodules and tree in bud nodularity are likely postinflammatory. 3. Stable cardiac enlargement with heavily calcified coronary artery plaque, especially in the distribution of the LAD. 4. Stable dilated central pulmonary arteries consistent with pulmonary hypertension. 5. Stable large hiatal hernia. 6. Further loss of height at the T12 level with further compression of the vertebral body. Maximal loss of height of the anterior and central vertebral body now approaches 70-80%. Small degree of retropulsion along the superior aspect of the posterior vertebral body remains present and stable compared to the prior CT. 7. Stable aortic atherosclerosis. Aortic Atherosclerosis (ICD10-I70.0). Electronically Signed   By: Marcey Moan M.D.   On: 03/28/2024 11:18    No results found.  CT Chest Wo Contrast Result Date: 03/28/2024 CLINICAL DATA:  Shortness of breath and weakness. Pneumonia earlier this year. EXAM: CT CHEST WITHOUT CONTRAST TECHNIQUE: Multidetector CT imaging of the chest was performed  following the standard protocol without IV contrast. RADIATION DOSE REDUCTION: This exam was performed according to the departmental dose-optimization program which includes automated exposure control, adjustment of the mA and/or kV according to patient size and/or use of iterative reconstruction technique. COMPARISON:  CTA chest 10/08/2023 FINDINGS: Cardiovascular: Stable cardiac enlargement without pericardial effusion. Stable heavily calcified coronary artery plaque, especially in the distribution of the LAD. Heavily calcified mitral annulus. Stable atherosclerosis of the thoracic aorta without aneurysm. Stable dilated central pulmonary arteries with the main pulmonary artery measuring up to approximately 3.4 cm. Mediastinum/Nodes: Stable small nonenlarged mediastinal lymph nodes. Stable large hiatal hernia. Lungs/Pleura: The ground-glass pneumonia and interstitial tree in bud nodularity affecting both lungs on the prior study, left greater than right, has resolved. Bronchial thickening seen on the prior study has also resolved. Multiple tiny pulmonary nodules are likely postinflammatory including 2 mm right upper lobe nodule on image 26 of lung windows, 1 mm posterior right upper lobe nodule on image 38, and some mild residual tree in bud nodularity in the right middle lobe. Small airway mucous plug in the posterior right upper lobe on image 44. Scarring in the posterior left lower lobe. Bilateral pleural effusions have resolved since the prior study. No pulmonary edema or pneumothorax. Upper Abdomen: No acute abnormality. Musculoskeletal: Further loss of height at the T12 level with further compression of the vertebral body. Maximal loss of height of the anterior and central vertebral body now approaches 70-80%. Small degree of retropulsion along the superior aspect of the posterior vertebral body remains present and stable compared to the prior CT. IMPRESSION: 1. Resolution of bilateral pneumonia and pleural  effusions since the prior study. 2. Residual tiny pulmonary nodules and tree in bud nodularity are likely postinflammatory. 3. Stable cardiac enlargement with heavily calcified coronary artery plaque, especially in the distribution of the LAD. 4. Stable dilated central pulmonary arteries consistent with pulmonary hypertension. 5. Stable large hiatal hernia. 6. Further loss of height at the T12 level with further compression of the vertebral body. Maximal loss of height of the anterior and central vertebral body now approaches 70-80%. Small degree of retropulsion along the superior aspect of the posterior vertebral body remains present and stable compared to the prior CT. 7. Stable aortic atherosclerosis. Aortic Atherosclerosis (ICD10-I70.0). Electronically Signed   By: Marcey Moan M.D.   On: 03/28/2024 11:18    Assessment and Plan: Patient Active Problem List   Diagnosis Date Noted   Influenza 10/08/2023   Acute bronchitis due to Haemophilus influenzae A 10/08/2023   Acute bronchitis 10/08/2023   Normocytic anemia 10/08/2023   Myocardial injury 10/08/2023   Chronic heart failure with preserved ejection fraction (HFpEF) (HCC) 10/08/2023   Positive D dimer 10/08/2023   T12 burst fracture (HCC) 09/25/2023   Orthostatic hypotension 09/23/2023   Rectus sheath hematoma 09/22/2023   Symptomatic anemia 09/22/2023   Obstructive hypertrophic cardiomyopathy (HCC) 03/02/2023   UTI (urinary tract infection) 03/01/2023   NSTEMI (non-ST elevated myocardial infarction) (HCC) 03/01/2023   Acute heart failure with preserved ejection fraction (HFpEF) (HCC) 03/01/2023   Leukocytosis 03/01/2023   Rigors 03/01/2023   Demand ischemia (HCC) 03/01/2023   Paroxysmal atrial flutter (HCC) 08/18/2022   Near syncope 08/18/2022   Paroxysmal tachycardia (HCC) 08/18/2022   HOCM (hypertrophic obstructive cardiomyopathy) (HCC) 08/18/2022   Labile hypertension 08/18/2022   Chest pain 08/18/2022  Paroxysmal atrial  fibrillation with RVR (HCC) 08/17/2022   Subdural hematoma (HCC) 11/14/2021   Iron  deficiency anemia, unspecified 10/12/2021   Cerumen impaction 10/10/2021   Prediabetes 10/10/2021   Polyarthralgia 09/26/2021   Decreased glomerular filtration rate (GFR) 04/01/2021   Aortic atherosclerosis (HCC) 03/28/2021   Acute hypoxic respiratory failure (HCC) 10/21/2020   Hyponatremia 10/21/2020   Elevated troponin 10/21/2020   Carotid stenosis, symptomatic w/o infarct, bilateral 10/19/2020   Carotid stenosis 09/12/2020   Urinary incontinence, urge 05/05/2020   Arteriosclerotic cerebrovascular disease 05/05/2020   Recurrent occipital headache 03/07/2019   Sensorineural hearing loss (SNHL) of left ear 01/23/2019   Renal artery stenosis (HCC) 01/01/2019   Lymphedema 01/01/2019   PAD (peripheral artery disease) (HCC) 01/01/2019   History of TIA (transient ischemic attack) 11/13/2018   Tinnitus aurium, bilateral 10/19/2018   Blurred vision, bilateral 10/19/2018   Insomnia due to psychological stress 10/07/2018   Food intolerance in adult 10/07/2018   Myalgia due to statin 12/10/2017   Hyperlipidemia LDL goal <70 10/03/2017   Hiatal hernia 07/03/2017   CAD (coronary atherosclerotic disease) 07/03/2017   Hospital discharge follow-up 07/03/2017   Chest pain due to CAD (HCC) 06/10/2017   Pain in left wrist 09/20/2016   Breast cancer screening by mammogram 09/20/2016   Precordial pain 05/31/2016   Hypothyroidism 04/28/2015   Renovascular hypertension 01/13/2014   Essential hypertension 01/13/2014   Osteopenia 10/25/2013   Disorder of bone and cartilage 10/25/2013   GERD (gastroesophageal reflux disease) 09/11/2013   Encounter for preventive health examination 09/11/2013   Heart murmur 11/28/2012   History of shingles    Peripheral edema 11/08/2012   Osteoarthritis of left shoulder 07/14/2012    1. Pneumonia of both lungs due to infectious organism, unspecified part of lung (Primary) This  has resolved she will follow up in a year  2. HOCM (hypertrophic obstructive cardiomyopathy) [facility]) Medical management  3. Shortness of breath She has albuterol  and should use it as needed   General Counseling: I have discussed the findings of the evaluation and examination with [facility].  I have also discussed any further diagnostic evaluation thatmay be needed or ordered today. Keon verbalizes understanding of the findings of todays visit. We also reviewed her medications today and discussed drug interactions and side effects including but not limited excessive drowsiness and altered mental states. We also discussed that there is always a risk not just to her but also people around her. she has been encouraged to call the office with any questions or concerns that should arise related to todays visit.  No orders of the defined types were placed in this encounter.    Time spent: 2  I have personally obtained a history, examined the patient, evaluated laboratory and imaging results, formulated the assessment and plan and placed orders.    Elfreda DELENA Bathe, MD [facility] Pulmonary and Critical Care Sleep medicine

## 2024-04-07 DIAGNOSIS — I10 Essential (primary) hypertension: Secondary | ICD-10-CM | POA: Diagnosis not present

## 2024-04-07 DIAGNOSIS — M810 Age-related osteoporosis without current pathological fracture: Secondary | ICD-10-CM | POA: Diagnosis not present

## 2024-04-07 DIAGNOSIS — Z79899 Other long term (current) drug therapy: Secondary | ICD-10-CM | POA: Diagnosis not present

## 2024-04-07 DIAGNOSIS — I421 Obstructive hypertrophic cardiomyopathy: Secondary | ICD-10-CM | POA: Diagnosis not present

## 2024-04-07 DIAGNOSIS — S22000S Wedge compression fracture of unspecified thoracic vertebra, sequela: Secondary | ICD-10-CM | POA: Diagnosis not present

## 2024-04-07 DIAGNOSIS — Z1331 Encounter for screening for depression: Secondary | ICD-10-CM | POA: Diagnosis not present

## 2024-04-07 LAB — PULMONARY FUNCTION TEST

## 2024-05-05 ENCOUNTER — Encounter (INDEPENDENT_AMBULATORY_CARE_PROVIDER_SITE_OTHER): Payer: Self-pay | Admitting: Nurse Practitioner

## 2024-05-05 ENCOUNTER — Ambulatory Visit (INDEPENDENT_AMBULATORY_CARE_PROVIDER_SITE_OTHER): Payer: Medicare Other

## 2024-05-05 ENCOUNTER — Ambulatory Visit (INDEPENDENT_AMBULATORY_CARE_PROVIDER_SITE_OTHER): Payer: Medicare Other | Admitting: Nurse Practitioner

## 2024-05-05 VITALS — BP 180/78 | HR 70 | Resp 18 | Ht 65.0 in | Wt 140.0 lb

## 2024-05-05 DIAGNOSIS — I6523 Occlusion and stenosis of bilateral carotid arteries: Secondary | ICD-10-CM

## 2024-05-05 DIAGNOSIS — I150 Renovascular hypertension: Secondary | ICD-10-CM

## 2024-05-05 DIAGNOSIS — E785 Hyperlipidemia, unspecified: Secondary | ICD-10-CM | POA: Diagnosis not present

## 2024-05-05 DIAGNOSIS — I701 Atherosclerosis of renal artery: Secondary | ICD-10-CM

## 2024-05-05 NOTE — Progress Notes (Signed)
 Subjective:    Patient ID: Kathryn Wise, female    DOB: 1930-04-13, 89 y.o.   MRN: [id number] Chief Complaint  Patient presents with   Follow-up    1 year carotid + renal    The patient is seen for follow up evaluation of carotid stenosis status post left carotid stent on 10/19/2020.  There were no post operative problems or complications related to the surgery.  The patient also had a right renal artery stent placed on 01/07/2019.   The patient denies interval amaurosis fugax. There is no recent history of TIA symptoms or focal motor deficits. There is no prior documented CVA.  She notes that her blood pressures have been fairly well-controlled at home.   The patient denies headache.   The patient is taking enteric-coated aspirin  81 mg daily.   The patient has a history of coronary artery disease, no recent episodes of angina or shortness of breath. The patient denies PAD or claudication symptoms. There is a history of hyperlipidemia which is being treated with a statin.    Studies done today: Carotid Duplex RICA 1-39% and LICA (stent) 1-39%   Renal duplex <50% stenosis bilaterally, normal resistive index and normal size kidnies bilaterally       Review of Systems  All other systems reviewed and are negative.      Objective:   Physical Exam Vitals reviewed.  HENT:     Head: Normocephalic.  Neck:     Vascular: Carotid bruit present.  Cardiovascular:     Rate and Rhythm: Normal rate.     Pulses: Normal pulses.     Heart sounds: Murmur heard.  Pulmonary:     Effort: Pulmonary effort is normal.  Neurological:     Mental Status: She is alert and oriented to person, place, and time.  Psychiatric:        Mood and Affect: Mood normal.        Behavior: Behavior normal.        Thought Content: Thought content normal.        Judgment: Judgment normal.     BP (!) 180/78 Comment: manual  Pulse 70   Resp 18   Ht 5' 5 (1.651 m)   Wt 140 lb 0.6 oz (63.5 kg)   BMI  23.30 kg/m   Past Medical History:  Diagnosis Date   Arthritis    Collagen vascular disease    Dysphagia, pharyngoesophageal phase 09/10/2013   Upper GI study with barium swallow was done at  [facility] Mar 2015   No reflux seen  Small irreducible hiatal hernia  Mild changes of presbyeophagus (abnormal contractions of the esophagus that occur with aging) No strictures Normal gastric emptying  Incomplete visualization of stomach fold due to patient's inability turn     GERD (gastroesophageal reflux disease)    Heart murmur    has had years and years   History of shingles 06/2012   treated with steroids , post op from shoulder surgery   History of squamous cell carcinoma 05/02/2016   right mid lateral pretibial   Hypertension    Hypothyroidism    Major depressive disorder, single episode 11/05/2015   Osteoarthritis of left shoulder 07/14/2012   Pneumonia 10/21/2020   Squamous cell carcinoma of skin 05/12/2016   R mid lat pretibial - other skin cancers treated by Dr. Jakie   Squamous cell carcinoma of skin 08/22/2020   left distal medial popliteal - [facility]   Squamous cell carcinoma of skin  11/25/2023   SCC IS, right distal lateral pretibial above the ankle, [facility]    Social History   Socioeconomic History   Marital status: Widowed    Spouse name: Not on file   Number of children: Not on file   Years of education: Not on file   Highest education level: Not on file  Occupational History   Not on file  Tobacco Use   Smoking status: Former    Current packs/day: 0.00    Types: Cigarettes    Quit date: 07/30/1972    Years since quitting: 51.8   Smokeless tobacco: Never   Tobacco comments:    smoked for only a few months  Vaping Use   Vaping status: Never Used  Substance and Sexual Activity   Alcohol use: Yes    Comment: OCCAS WINE    Drug use: No   Sexual activity: Never  Other Topics Concern   Not on file  Social History Narrative   Not on file   Social Drivers of Health    Financial Resource Strain: Low Risk  (04/07/2024)   Received from [facility]   Overall Financial Resource Strain (CARDIA)    Difficulty of Paying Living Expenses: Not hard at all  Food Insecurity: No Food Insecurity (04/07/2024)   Received from [facility]   Hunger Vital Sign    Within the past 12 months, you worried that your food would run out before you got the money to buy more.: Never true    Within the past 12 months, the food you bought just didn't last and you didn't have money to get more.: Never true  Transportation Needs: No Transportation Needs (04/07/2024)   Received from [facility] - Transportation    In the past 12 months, has lack of transportation kept you from medical appointments or from getting medications?: No    Lack of Transportation (Non-Medical): No  Physical Activity: Sufficiently Active (04/20/2020)   Exercise Vital Sign    Days of Exercise per Week: 4 days    Minutes of Exercise per Session: 60 min  Stress: No Stress Concern Present (04/20/2020)   [employer] of Occupational Health - Occupational Stress Questionnaire    Feeling of Stress : Not at all  Social Connections: Moderately Isolated (10/10/2023)   Social Connection and Isolation Panel    Frequency of Communication with Friends and Family: More than three times a week    Frequency of Social Gatherings with Friends and Family: Once a week    Attends Religious Services: 1 to 4 times per year    Active Member of [employer] or Organizations: No    Attends Banker Meetings: Never    Marital Status: Widowed  Intimate Partner Violence: Not At Risk (10/10/2023)   Humiliation, Afraid, Rape, and Kick questionnaire    Fear of Current or Ex-Partner: No    Emotionally Abused: No    Physically Abused: No    Sexually Abused: No    Past Surgical History:  Procedure Laterality Date   ARTHOSCOPIC ROTAOR CUFF REPAIR     LEFT  10+  YEARS      CAROTID PTA/STENT INTERVENTION Left 10/19/2020   Procedure: CAROTID PTA/STENT INTERVENTION;  Surgeon: Jama Cordella MATSU, MD;  Location: [facility];  Service: Cardiovascular;  Laterality: Left;   CATARACT EXTRACTION W/ INTRAOCULAR LENS IMPLANT     RIGHT EYE   CATARACT EXTRACTION W/PHACO Left 03/28/2016   Procedure:  CATARACT EXTRACTION PHACO AND INTRAOCULAR LENS PLACEMENT (IOC);  Surgeon: Dene Etienne, MD;  Location: [facility];  Service: Ophthalmology;  Laterality: Left;  TORIC   FACELIFT     FOOT SURGERY     rt foot   TUMOR REMOVED   JOINT REPLACEMENT Left 06/2012   shoulder   LEFT HEART CATH AND CORONARY ANGIOGRAPHY N/A 06/12/2017   Procedure: LEFT HEART CATH AND CORONARY ANGIOGRAPHY;  Surgeon: Perla Evalene PARAS, MD;  Location: [facility];  Service: Cardiovascular;  Laterality: N/A;   RENAL ANGIOGRAPHY Left 01/07/2019   Procedure: RENAL ANGIOGRAPHY;  Surgeon: Jama Cordella MATSU, MD;  Location: [facility];  Service: Cardiovascular;  Laterality: Left;   RENAL ARTERY STENT     SKIN CANCER EXCISION     TOTAL SHOULDER ARTHROPLASTY  07/14/2012   Procedure: TOTAL SHOULDER ARTHROPLASTY;  Surgeon: Fonda SHAUNNA Olmsted, MD;  Location: [facility] OR;  Service: Orthopedics;  Laterality: Left;    Family History  Problem Relation Age of Onset   Cancer Daughter        Breast   Breast cancer Daughter 1   Cancer Son     Allergies  Allergen Reactions   Alendronate Swelling    And nausea     Lisinopril  Other (See Comments)    Hypertension    Codeine Other (See Comments)    Altered Mental Status    Oxycodone     Other reaction(s): Other (See Comments) Altered mental status   Oxycontin [Oxycodone Hcl]     Altered mental status       Latest Ref Rng & Units 01/20/2024    2:02 PM 10/24/2023   11:27 AM 10/14/2023    1:36 PM  CBC  WBC 3.4 - 10.8 x10E3/uL 6.7  7.1  11.0   Hemoglobin 11.1 - 15.9 g/dL 88.8  9.4  9.5   Hematocrit 34.0 - 46.6 % 35.1  31.3   31.7   Platelets 150 - 450 x10E3/uL 167  202  235       CMP     Component Value Date/Time   NA 133 (L) 01/20/2024 1402   K 5.5 (H) 01/20/2024 1402   CL 98 01/20/2024 1402   CO2 23 01/20/2024 1402   GLUCOSE 131 (H) 01/20/2024 1402   GLUCOSE 86 10/24/2023 1127   BUN 19 01/20/2024 1402   CREATININE 1.10 (H) 01/20/2024 1402   CREATININE 1.14 (H) 12/03/2018 1417   CALCIUM  9.5 01/20/2024 1402   PROT 6.4 (L) 10/24/2023 1127   ALBUMIN 3.4 (L) 10/24/2023 1127   AST 21 10/24/2023 1127   ALT 13 10/24/2023 1127   ALKPHOS 62 10/24/2023 1127   BILITOT 0.9 10/24/2023 1127   GFR 51.19 (L) 09/26/2021 1426   EGFR 47 (L) 01/20/2024 1402   GFRNONAA 57 (L) 10/24/2023 1127     No results found.     Assessment & Plan:   1. RAS (renal artery stenosis) (Primary) Recommend:  BP today was elevated but the patient notes that this is out of her normal range of a systolic of 120-150.  She does note that she typically has problems with the automatic blood pressure cuffs at doctors offices. Given patient's atherosclerosis and PAD optimal control of the patient's hypertension is important.  The patient's BP and noninvasive studies support the previous intervention is patent. No further intervention is indicated at this time.  Therefore the patient  will continue the current medications, no changes at this time.  The primary medical service will  continue aggressive antihypertensive therapy as per the [facility] guidelines   Patient will follow-up with duplex ultrasound of the renal arteries as ordered.  2. Carotid stenosis, symptomatic w/o infarct, bilateral Recommend:  Given the patient's asymptomatic subcritical stenosis no further invasive testing or surgery at this time.  Duplex ultrasound shows <40% stenosis bilaterally.  Continue antiplatelet therapy as prescribed Continue management of CAD, HTN and Hyperlipidemia Healthy heart diet,  encouraged exercise at least 4 times per week  Follow  up in 12 months with duplex ultrasound and physical exam   3. Hyperlipidemia LDL goal <130 Continue statin as ordered and reviewed, no changes at this time   Current Outpatient Medications on File Prior to Visit  Medication Sig Dispense Refill   albuterol  (VENTOLIN  HFA) 108 (90 Base) MCG/ACT inhaler Inhale 2 puffs into the lungs every 6 (six) hours as needed for wheezing or shortness of breath.     amLODipine  (NORVASC ) 5 MG tablet Take 1 tablet (5 mg total) by mouth as needed (Once daily AS NEEDED for SBP (top blood pressure number) is greater than 160.). 90 tablet 3   aspirin  EC 81 MG tablet Take 1 tablet (81 mg total) by mouth daily. Swallow whole.     bisacodyl  (DULCOLAX) 5 MG EC tablet Take 1 tablet (5 mg total) by mouth daily as needed for moderate constipation.     bisoprolol  (ZEBETA ) 5 MG tablet Take 1 tablet (5 mg total) by mouth every other day. 15 tablet 2   Cholecalciferol  (VITAMIN D -3) 1000 units CAPS Take 1,000 Units by mouth daily.     cyanocobalamin  (VITAMIN B12) 1000 MCG tablet Take 1,000 mcg by mouth daily.     lactulose  (CHRONULAC ) 10 GM/15ML solution Take 45 mLs (30 g total) by mouth 2 (two) times daily as needed for mild constipation.     levothyroxine  (SYNTHROID ) 75 MCG tablet Take 75 mcg by mouth daily.     lidocaine  (LIDODERM ) 5 % Place 1-3 patches onto the skin daily. Remove & Discard patch within 12 hours or as directed by MD     nitroGLYCERIN  (NITROSTAT ) 0.4 MG SL tablet Place 1 tablet (0.4 mg total) under the tongue every 5 (five) minutes as needed for chest pain. 30 tablet 1   omeprazole  (PRILOSEC) 20 MG capsule TAKE 1 CAPSULE(20 MG) BY MOUTH EVERY MORNING AS NEEDED FOR HEARTBURN 90 capsule 2   rosuvastatin  (CRESTOR ) 10 MG tablet Take 1 tablet (10 mg total) by mouth at bedtime. 90 tablet 3   temazepam  (RESTORIL ) 15 MG capsule Take 1 capsule (15 mg total) by mouth at bedtime as needed for sleep. 30 capsule 0   Turmeric 500 MG CAPS Take 500 mg by mouth daily.      doxycycline (ADOXA) 100 MG tablet Take 100 mg by mouth 2 (two) times daily. (Patient not taking: Reported on 05/05/2024)     mupirocin  ointment (BACTROBAN ) 2 % Apply 1 Application topically daily. (Patient not taking: Reported on 05/05/2024) 22 g 0   predniSONE  (DELTASONE ) 10 MG tablet Take 10 mg by mouth daily with breakfast. (Patient not taking: Reported on 05/05/2024)     No current facility-administered medications on file prior to visit.    There are no Patient Instructions on file for this visit. No follow-ups on file.   Casey Maxfield E Thayer Inabinet, NP

## 2024-05-07 ENCOUNTER — Other Ambulatory Visit: Payer: Self-pay | Admitting: Physician Assistant

## 2024-05-27 DIAGNOSIS — M810 Age-related osteoporosis without current pathological fracture: Secondary | ICD-10-CM | POA: Diagnosis not present

## 2024-05-27 DIAGNOSIS — Z9189 Other specified personal risk factors, not elsewhere classified: Secondary | ICD-10-CM | POA: Diagnosis not present

## 2024-05-27 DIAGNOSIS — S22081A Stable burst fracture of T11-T12 vertebra, initial encounter for closed fracture: Secondary | ICD-10-CM | POA: Diagnosis not present

## 2024-05-27 DIAGNOSIS — E039 Hypothyroidism, unspecified: Secondary | ICD-10-CM | POA: Diagnosis not present

## 2024-06-17 NOTE — Telephone Encounter (Signed)
 open in error

## 2024-07-03 DIAGNOSIS — S22082S Unstable burst fracture of T11-T12 vertebra, sequela: Secondary | ICD-10-CM | POA: Diagnosis not present

## 2024-07-03 DIAGNOSIS — R1314 Dysphagia, pharyngoesophageal phase: Secondary | ICD-10-CM | POA: Diagnosis not present

## 2024-07-03 DIAGNOSIS — M810 Age-related osteoporosis without current pathological fracture: Secondary | ICD-10-CM | POA: Diagnosis not present

## 2024-07-03 DIAGNOSIS — I10 Essential (primary) hypertension: Secondary | ICD-10-CM | POA: Diagnosis not present

## 2024-07-03 DIAGNOSIS — Z23 Encounter for immunization: Secondary | ICD-10-CM | POA: Diagnosis not present

## 2024-08-13 ENCOUNTER — Ambulatory Visit (INDEPENDENT_AMBULATORY_CARE_PROVIDER_SITE_OTHER): Admitting: Dermatology

## 2024-08-13 ENCOUNTER — Encounter: Payer: Self-pay | Admitting: Dermatology

## 2024-08-13 DIAGNOSIS — L820 Inflamed seborrheic keratosis: Secondary | ICD-10-CM

## 2024-08-13 DIAGNOSIS — Z86007 Personal history of in-situ neoplasm of skin: Secondary | ICD-10-CM

## 2024-08-13 DIAGNOSIS — D0461 Carcinoma in situ of skin of right upper limb, including shoulder: Secondary | ICD-10-CM

## 2024-08-13 DIAGNOSIS — W908XXA Exposure to other nonionizing radiation, initial encounter: Secondary | ICD-10-CM

## 2024-08-13 DIAGNOSIS — D489 Neoplasm of uncertain behavior, unspecified: Secondary | ICD-10-CM | POA: Diagnosis not present

## 2024-08-13 DIAGNOSIS — D099 Carcinoma in situ, unspecified: Secondary | ICD-10-CM

## 2024-08-13 DIAGNOSIS — L578 Other skin changes due to chronic exposure to nonionizing radiation: Secondary | ICD-10-CM | POA: Diagnosis not present

## 2024-08-13 DIAGNOSIS — D0462 Carcinoma in situ of skin of left upper limb, including shoulder: Secondary | ICD-10-CM | POA: Diagnosis not present

## 2024-08-13 DIAGNOSIS — L821 Other seborrheic keratosis: Secondary | ICD-10-CM | POA: Diagnosis not present

## 2024-08-13 DIAGNOSIS — C4492 Squamous cell carcinoma of skin, unspecified: Secondary | ICD-10-CM

## 2024-08-13 DIAGNOSIS — C44729 Squamous cell carcinoma of skin of left lower limb, including hip: Secondary | ICD-10-CM

## 2024-08-13 DIAGNOSIS — D692 Other nonthrombocytopenic purpura: Secondary | ICD-10-CM

## 2024-08-13 DIAGNOSIS — Z8589 Personal history of malignant neoplasm of other organs and systems: Secondary | ICD-10-CM

## 2024-08-13 HISTORY — DX: Carcinoma in situ, unspecified: D09.9

## 2024-08-13 HISTORY — DX: Squamous cell carcinoma of skin, unspecified: C44.92

## 2024-08-13 MED ORDER — MUPIROCIN 2 % EX OINT: TOPICAL_OINTMENT | CUTANEOUS | 11 refills | Status: AC

## 2024-08-13 NOTE — Patient Instructions (Addendum)
 Biopsy Wound Care Instructions  Leave the original bandage on for 24 hours if possible.  If the bandage becomes soaked or soiled before that time, it is OK to remove it and examine the wound.  A small amount of post-operative bleeding is normal.  If excessive bleeding occurs, remove the bandage, place gauze over the site and apply continuous pressure (no peeking) over the area for 30 minutes. If this does not work, please call our clinic as soon as possible or page your doctor if it is after hours.   Once a day, cleanse the wound with soap and water. It is fine to shower. If a thick crust develops you may use a Q-tip dipped into dilute hydrogen peroxide (mix 1:1 with water) to dissolve it.  Hydrogen peroxide can slow the healing process, so use it only as needed.    After washing, apply petroleum jelly (Vaseline) or an antibiotic ointment if your doctor prescribed one for you, followed by a bandage.    For best healing, the wound should be covered with a layer of ointment at all times. If you are not able to keep the area covered with a bandage to hold the ointment in place, this may mean re-applying the ointment several times a day.  Continue this wound care until the wound has healed and is no longer open.   Itching and mild discomfort is normal during the healing process. However, if you develop pain or severe itching, please call our office.   If you have any discomfort, you can take Tylenol  (acetaminophen ) or ibuprofen as directed on the bottle. (Please do not take these if you have an allergy to them or cannot take them for another reason).  Some redness, tenderness and white or yellow material in the wound is normal healing.  If the area becomes very sore and red, or develops a thick yellow-green material (pus), it may be infected; please notify us .    If you have stitches, return to clinic as directed to have the stitches removed. You will continue wound care for 2-3 days after the stitches  are removed.   Wound healing continues for up to one year following surgery. It is not unusual to experience pain in the scar from time to time during the interval.  If the pain becomes severe or the scar thickens, you should notify the office.    A slight amount of redness in a scar is expected for the first six months.  After six months, the redness will fade and the scar will soften and fade.  The color difference becomes less noticeable with time.  If there are any problems, return for a post-op surgery check at your earliest convenience.  To improve the appearance of the scar, you can use silicone scar gel, cream, or sheets (such as Mederma or Serica) every night for up to one year. These are available over the counter (without a prescription).  Please call our office at [phone] for any questions or concerns.    Electrodesiccation and Curettage (Scrape and Burn) Wound Care Instructions  Leave the original bandage on for 24 hours if possible.  If the bandage becomes soaked or soiled before that time, it is OK to remove it and examine the wound.  A small amount of post-operative bleeding is normal.  If excessive bleeding occurs, remove the bandage, place gauze over the site and apply continuous pressure (no peeking) over the area for 30 minutes. If this does not work, please call  our clinic as soon as possible or page your doctor if it is after hours.   Once a day, cleanse the wound with soap and water. It is fine to shower. If a thick crust develops you may use a Q-tip dipped into dilute hydrogen peroxide (mix 1:1 with water) to dissolve it.  Hydrogen peroxide can slow the healing process, so use it only as needed.    After washing, apply petroleum jelly (Vaseline) or an antibiotic ointment if your doctor prescribed one for you, followed by a bandage.    For best healing, the wound should be covered with a layer of ointment at all times. If you are not able to keep the area covered  with a bandage to hold the ointment in place, this may mean re-applying the ointment several times a day.  Continue this wound care until the wound has healed and is no longer open. It may take several weeks for the wound to heal and close.  Itching and mild discomfort is normal during the healing process.  If you have any discomfort, you can take Tylenol  (acetaminophen ) or ibuprofen as directed on the bottle. (Please do not take these if you have an allergy to them or cannot take them for another reason).  Some redness, tenderness and white or yellow material in the wound is normal healing.  If the area becomes very sore and red, or develops a thick yellow-green material (pus), it may be infected; please notify us .    Wound healing continues for up to one year following surgery. It is not unusual to experience pain in the scar from time to time during the interval.  If the pain becomes severe or the scar thickens, you should notify the office.    A slight amount of redness in a scar is expected for the first six months.  After six months, the redness will fade and the scar will soften and fade.  The color difference becomes less noticeable with time.  If there are any problems, return for a post-op surgery check at your earliest convenience.  To improve the appearance of the scar, you can use silicone scar gel, cream, or sheets (such as Mederma or Serica) every night for up to one year. These are available over the counter (without a prescription).  Please call our office at [phone] for any questions or concerns.     Seborrheic Keratosis  What causes seborrheic keratoses? Seborrheic keratoses are harmless, common skin growths that first appear during adult life.  As time goes by, more growths appear.  Some people may develop a large number of them.  Seborrheic keratoses appear on both covered and uncovered body parts.  They are not caused by sunlight.  The tendency to develop seborrheic  keratoses can be inherited.  They vary in color from skin-colored to gray, brown, or even black.  They can be either smooth or have a rough, warty surface.   Seborrheic keratoses are superficial and look as if they were stuck on the skin.  Under the microscope this type of keratosis looks like layers upon layers of skin.  That is why at times the top layer may seem to fall off, but the rest of the growth remains and re-grows.    Treatment Seborrheic keratoses do not need to be treated, but can easily be removed in the office.  Seborrheic keratoses often cause symptoms when they rub on clothing or jewelry.  Lesions can be in the way of shaving.  If they become inflamed, they can cause itching, soreness, or burning.  Removal of a seborrheic keratosis can be accomplished by freezing, burning, or surgery. If any spot bleeds, scabs, or grows rapidly, please return to have it checked, as these can be an indication of a skin cancer.   Cryotherapy Aftercare  Wash gently with soap and water everyday.   Apply Vaseline and Band-Aid daily until healed.    Due to recent changes in healthcare laws, you may see results of your pathology and/or laboratory studies on MyChart before the doctors have had a chance to review them. We understand that in some cases there may be results that are confusing or concerning to you. Please understand that not all results are received at the same time and often the doctors may need to interpret multiple results in order to provide you with the best plan of care or course of treatment. Therefore, we ask that you please give us  2 business days to thoroughly review all your results before contacting the office for clarification. Should we see a critical lab result, you will be contacted sooner.   If You Need Anything After Your Visit  If you have any questions or concerns for your doctor, please call our main line at [phone] and press option 4 to reach your doctor's medical  assistant. If no one answers, please leave a voicemail as directed and we will return your call as soon as possible. Messages left after 4 pm will be answered the following business day.   You may also send us  a message via MyChart. We typically respond to MyChart messages within 1-2 business days.  For prescription refills, please ask your pharmacy to contact our office. Our fax number is [phone].  If you have an urgent issue when the clinic is closed that cannot wait until the next business day, you can page your doctor at the number below.    Please note that while we do our best to be available for urgent issues outside of office hours, we are not available 24/7.   If you have an urgent issue and are unable to reach us , you may choose to seek medical care at your doctor's office, retail clinic, urgent care center, or emergency room.  If you have a medical emergency, please immediately call 911 or go to the emergency department.  Pager Numbers  - Dr. Hester: [phone]  - Dr. Jackquline: [phone]  - Dr. Claudene: [phone]   - Dr. Raymund: [phone]  In the event of inclement weather, please call our main line at [phone] for an update on the status of any delays or closures.  Dermatology Medication Tips: Please keep the boxes that topical medications come in in order to help keep track of the instructions about where and how to use these. Pharmacies typically print the medication instructions only on the boxes and not directly on the medication tubes.   If your medication is too expensive, please contact our office at [phone] option 4 or send us  a message through MyChart.   We are unable to tell what your co-pay for medications will be in advance as this is different depending on your insurance coverage. However, we may be able to find a substitute medication at lower cost or fill out paperwork to get insurance to cover a needed medication.   If a prior  authorization is required to get your medication covered by your insurance company, please allow us  1-2 business days to complete this process.  Drug prices often vary depending on where the prescription is filled and some pharmacies may offer cheaper prices.  The website www.goodrx.com contains coupons for medications through different pharmacies. The prices here do not account for what the cost may be with help from insurance (it may be cheaper with your insurance), but the website can give you the price if you did not use any insurance.  - You can print the associated coupon and take it with your prescription to the pharmacy.  - You may also stop by our office during regular business hours and pick up a GoodRx coupon card.  - If you need your prescription sent electronically to a different pharmacy, notify our office through [facility] or by phone at [phone] option 4.     Si Usted Necesita Algo Despus de Su Visita  Tambin puede enviarnos un mensaje a travs de Clinical Cytogeneticist. Por lo general respondemos a los mensajes de MyChart en el transcurso de 1 a 2 das hbiles.  Para renovar recetas, por favor pida a su farmacia que se ponga en contacto con nuestra oficina. Randi lakes de fax es [place] [phone].  Si tiene un asunto urgente cuando la clnica est cerrada y que no puede esperar hasta el siguiente da hbil, puede llamar/localizar a su doctor(a) al nmero que aparece a continuacin.   Por favor, tenga en cuenta que aunque hacemos todo lo posible para estar disponibles para asuntos urgentes fuera del horario de [place], no estamos disponibles las 24 horas del da, los 7 [address]  [address] de la [place].   Si tiene un problema urgente y no puede comunicarse con nosotros, puede optar por buscar atencin mdica  en el consultorio de su doctor(a), en una clnica privada, en un centro de atencin urgente o en una sala de emergencias.  Si tiene engineer, drilling, por favor llame  inmediatamente al 911 o vaya a la sala de emergencias.  Nmeros de bper  - Dr. Hester: [phone]  - Dra. Jackquline: [phone]  - Dr. Claudene: [phone]  - Dra. Kitts: [phone]  En caso de inclemencias del [place], por favor llame a nuestra lnea principal al [phone] para una actualizacin sobre el estado de cualquier retraso o cierre.  Consejos para la medicacin en dermatologa: Por favor, guarde las cajas en las que vienen los medicamentos de uso tpico para ayudarle a seguir las instrucciones sobre dnde y cmo usarlos. Las farmacias generalmente imprimen las instrucciones del medicamento slo en las cajas y no directamente en los tubos del [place].   Si su medicamento es muy caro, por favor, pngase en contacto con landry rieger llamando al [phone] y presione la opcin 4 o envenos un mensaje a travs de Clinical Cytogeneticist.   No podemos decirle cul ser su copago por los medicamentos por adelantado ya que esto es diferente dependiendo de la cobertura de su seguro. Sin embargo, es posible que podamos encontrar un medicamento sustituto a audiological scientist un formulario para que el seguro cubra el medicamento que se considera necesario.   Si se requiere una autorizacin previa para que su compaa de seguros cubra su medicamento, por favor permtanos de 1 a 2 das hbiles para completar este proceso.  Los precios de los medicamentos varan con frecuencia dependiendo del environmental consultant de dnde se surte la receta y alguna farmacias pueden ofrecer precios ms baratos.  El sitio web www.goodrx.com tiene cupones para medicamentos de health and safety inspector. Los precios aqu no tienen en cuenta lo que podra costar con la ayuda del  seguro (puede ser ms barato con su seguro), pero el sitio web puede darle el precio si no visual merchandiser.  - Puede imprimir el cupn correspondiente y llevarlo con su receta a la farmacia.  - Tambin puede pasar por nuestra oficina durante el horario  de atencin regular y education officer, museum una tarjeta de cupones de GoodRx.  - Si necesita que su receta se enve electrnicamente a una farmacia diferente, informe a nuestra oficina a travs de MyChart de [place] o por telfono llamando al [phone] y presione la opcin 4.

## 2024-08-13 NOTE — Progress Notes (Signed)
 "  Follow-Up Visit   Subjective  Kathryn Wise is a 89 y.o. female who presents for the following: sore spots on arms, hands and legs   Hx of scc  Hx of isk   Isk at arms   The patient has spots, moles and lesions to be evaluated, some may be new or changing and the patient may have concern these could be cancer.  The following portions of the chart were reviewed this encounter and updated as appropriate: medications, allergies, medical history  Review of Systems:  No other skin or systemic complaints except as noted in HPI or Assessment and Plan.  Objective  Well appearing patient in no apparent distress; mood and affect are within normal limits.   A focused examination was performed of the following areas: Arms, legs, hands, trunk, chest , back   Relevant exam findings are noted in the Assessment and Plan.  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  - Call if any new or changing lesions are noted between office visits  ACTINIC DAMAGE - chronic, secondary to cumulative UV radiation exposure/sun exposure over time - diffuse scaly erythematous macules with underlying dyspigmentation - Recommend daily broad spectrum sunscreen SPF 30+ to sun-exposed areas, reapply every 2 hours as needed.  - Recommend staying in the shade or wearing long sleeves, sun glasses (UVA+UVB  protection) and wide brim hats (4-inch brim around the entire circumference of the hat). - Call for new or changing lesions.  SEBORRHEIC KERATOSIS - Stuck-on, waxy, tan-brown papules and/or plaques  - Benign-appearing - Discussed benign etiology and prognosis. - Observe - Call for any changes  Purpura - Chronic; persistent and recurrent.  Treatable, but not curable. - Violaceous macules and patches - Benign - Related to trauma, age, sun damage and/or use of blood thinners, chronic use of topical and/or oral steroids - Observe - Can use OTC arnica containing moisturizer such as Dermend Bruise Formula if desired - Call for worsening or other concerns   INFLAMED SEBORRHEIC KERATOSIS (23) chest x 6 , back x 5, b/l arms x 12 (23) Symptomatic, irritating, patient would like treated. - Destruction of lesion - chest x 6 , back x 5, b/l arms x 12 (23) Complexity: simple   Destruction method: cryotherapy   Informed consent: discussed and consent obtained   Timeout:  patient name, date of birth, surgical site, and procedure verified Lesion destroyed using liquid nitrogen: Yes   Region frozen until ice ball extended beyond lesion: Yes   Outcome: patient tolerated procedure well with no complications   Post-procedure details: wound care instructions given    NEOPLASM OF UNCERTAIN BEHAVIOR (3) right distal dorsum forearm - Epidermal / dermal shaving  Lesion diameter (cm):  1.1 Informed consent: discussed and consent obtained   Timeout: patient name, date of birth, surgical site, and procedure verified   Procedure prep:  Patient was prepped  and draped in usual sterile fashion Prep type:  Isopropyl alcohol Anesthesia: the lesion was anesthetized in a standard fashion   Anesthetic:  1% lidocaine  w/ epinephrine  1-100,000 buffered w/ 8.4% NaHCO3 Instrument used: flexible razor blade   Hemostasis achieved with: pressure, aluminum chloride and electrodesiccation   Outcome: patient tolerated  procedure well   Post-procedure details: sterile dressing applied and wound care instructions given   Dressing type: bandage and petrolatum    - Destruction of lesion Complexity: extensive   Destruction method: electrodesiccation and curettage   Informed consent: discussed and consent obtained   Timeout:  patient name, date of birth, surgical site, and procedure verified Procedure prep:  Patient was prepped and draped in usual sterile fashion Prep type:  Isopropyl alcohol Anesthesia: the lesion was anesthetized in a standard fashion   Anesthetic:  1% lidocaine  w/ epinephrine  1-100,000 buffered w/ 8.4% NaHCO3 Curettage performed in three different directions: Yes   Electrodesiccation performed over the curetted area: Yes   Lesion length (cm):  1.1 Lesion width (cm):  1.1 Margin per side (cm):  0.2 Final wound size (cm):  1.5 Hemostasis achieved with:  pressure, aluminum chloride and electrodesiccation Outcome: patient tolerated procedure well with no complications   Post-procedure details: sterile dressing applied and wound care instructions given   Dressing type: bandage and petrolatum    Specimen 1 - Surgical pathology Differential Diagnosis: r/o scc ED&C today Check Margins: no left distal dorsum forearm - Epidermal / dermal shaving  Lesion diameter (cm):  1.1 Informed consent: discussed and consent obtained   Timeout: patient name, date of birth, surgical site, and procedure verified   Procedure prep:  Patient was prepped and draped in usual sterile fashion Prep type:  Isopropyl alcohol Anesthesia: the lesion was anesthetized in a standard fashion   Anesthetic:  1% lidocaine  w/ epinephrine  1-100,000 buffered w/ 8.4% NaHCO3 Instrument used: flexible razor blade   Hemostasis achieved with: pressure, aluminum chloride and electrodesiccation   Outcome: patient tolerated procedure well   Post-procedure details: sterile dressing applied and wound care instructions given    Dressing type: bandage and petrolatum    - Destruction of lesion Complexity: extensive   Destruction method: electrodesiccation and curettage   Informed consent: discussed and consent obtained   Timeout:  patient name, date of birth, surgical site, and procedure verified Procedure prep:  Patient was prepped and draped in usual sterile fashion Prep type:  Isopropyl alcohol Anesthesia: the lesion was anesthetized in a standard fashion   Anesthetic:  1% lidocaine  w/ epinephrine  1-100,000 buffered w/ 8.4% NaHCO3 Curettage performed in three different directions: Yes   Electrodesiccation performed over the curetted area: Yes   Lesion length (cm):  1.1 Lesion width (cm):  1.1 Margin per side (cm):  0.2 Final wound size (cm):  1.5 Hemostasis achieved with:  pressure, aluminum chloride and electrodesiccation Outcome: patient tolerated procedure well with no complications   Post-procedure details: sterile dressing applied and wound care instructions given   Dressing type: bandage and petrolatum    Specimen 2 - Surgical pathology Differential Diagnosis: r/o scc ED&C today  Check Margins: No left posterior lateral ankle - Epidermal / dermal shaving  Lesion diameter (cm):  1.5 Informed consent: discussed and consent obtained   Timeout: patient name, date of birth, surgical site, and procedure verified   Procedure prep:  Patient was prepped and draped in usual sterile fashion Prep type:  Isopropyl alcohol Anesthesia: the lesion was anesthetized in a standard fashion   Anesthetic:  1%  lidocaine  w/ epinephrine  1-100,000 buffered w/ 8.4% NaHCO3 Instrument used: flexible razor blade   Hemostasis achieved with: pressure, aluminum chloride and electrodesiccation   Outcome: patient tolerated procedure well   Post-procedure details: sterile dressing applied and wound care instructions given   Dressing type: bandage and petrolatum    - Destruction of lesion Complexity: extensive   Destruction  method: electrodesiccation and curettage   Informed consent: discussed and consent obtained   Timeout:  patient name, date of birth, surgical site, and procedure verified Procedure prep:  Patient was prepped and draped in usual sterile fashion Prep type:  Isopropyl alcohol Anesthesia: the lesion was anesthetized in a standard fashion   Anesthetic:  1% lidocaine  w/ epinephrine  1-100,000 buffered w/ 8.4% NaHCO3 Curettage performed in three different directions: Yes   Electrodesiccation performed over the curetted area: Yes   Lesion length (cm):  1.5 Lesion width (cm):  1.5 Margin per side (cm):  0.2 Final wound size (cm):  1.9 Hemostasis achieved with:  pressure, aluminum chloride and electrodesiccation Outcome: patient tolerated procedure well with no complications   Post-procedure details: sterile dressing applied and wound care instructions given   Dressing type: bandage and petrolatum    Specimen 3 - Surgical pathology Differential Diagnosis: r/o scc  ED&C today  2 pieces of tissue in bottle  Check Margins: No R/o scc   ED&C on all  This Visit - mupirocin  ointment (BACTROBAN ) 2 % - Apply topically to wounds at arms and left ankle daily to twice daily and cover until healed can also use on bug bites scrapes burns and other wounds as needed ACTINIC SKIN DAMAGE   HISTORY OF SQUAMOUS CELL CARCINOMA   SEBORRHEIC KERATOSIS   PURPURA   SQUAMOUS CELL CARCINOMA IN SITU (SCCIS) OF SKIN OF RIGHT FOREARM   SQUAMOUS CELL CARCINOMA IN SITU (SCCIS) OF SKIN OF LEFT FOREARM   SCC (SQUAMOUS CELL CARCINOMA), LEG, LEFT    Return as scheduled  I, Eleanor Blush, CMA, am acting as scribe for [name], MD.   Documentation: I have reviewed the above documentation for accuracy and completeness, and I agree with the above.  [name], MD    "

## 2024-08-17 LAB — SURGICAL PATHOLOGY

## 2024-08-18 ENCOUNTER — Ambulatory Visit: Payer: Self-pay | Admitting: Dermatology

## 2024-08-18 ENCOUNTER — Encounter: Payer: Self-pay | Admitting: Dermatology

## 2024-08-18 NOTE — Telephone Encounter (Addendum)
 Tried calling patient regarding results. No answer. Lm for patient to return call.   ----- Message from [name], MD sent at 08/18/2024  9:43 AM EST ----- FINAL DIAGNOSIS        1. Skin, right distal dorsum forearm :       SQUAMOUS CELL CARCINOMA IN SITU        2. Skin, left distal dorsum forearm :       SQUAMOUS CELL CARCINOMA IN SITU, HYPERTROPHIC        3. Skin, left posterior lateral ankle :       SQUAMOUS CELL CARCINOMA, KERATOACANTHOMA TYPE     1,2,3 - all Three Cancer = SCC All Three already treated Recheck next visit

## 2024-08-18 NOTE — Telephone Encounter (Addendum)
 Patient returned call and discussed results with her. She verbalized understanding and denied further questions. Will recheck at follow up.  ----- Message from [name], MD sent at 08/18/2024  9:43 AM EST ----- FINAL DIAGNOSIS        1. Skin, right distal dorsum forearm :       SQUAMOUS CELL CARCINOMA IN SITU        2. Skin, left distal dorsum forearm :       SQUAMOUS CELL CARCINOMA IN SITU, HYPERTROPHIC        3. Skin, left posterior lateral ankle :       SQUAMOUS CELL CARCINOMA, KERATOACANTHOMA TYPE     1,2,3 - all Three Cancer = SCC All Three already treated Recheck next visit

## 2024-08-28 ENCOUNTER — Ambulatory Visit: Admitting: Physician Assistant

## 2024-08-31 ENCOUNTER — Ambulatory Visit: Admitting: Physician Assistant

## 2024-09-16 ENCOUNTER — Ambulatory Visit: Admitting: Physician Assistant

## 2024-11-24 ENCOUNTER — Ambulatory Visit: Admitting: Dermatology

## 2025-04-12 ENCOUNTER — Ambulatory Visit: Admitting: Internal Medicine

## 2025-05-05 ENCOUNTER — Ambulatory Visit (INDEPENDENT_AMBULATORY_CARE_PROVIDER_SITE_OTHER): Admitting: Nurse Practitioner

## 2025-05-05 ENCOUNTER — Encounter (INDEPENDENT_AMBULATORY_CARE_PROVIDER_SITE_OTHER)
